# Patient Record
Sex: Male | Born: 1950 | Race: White | Hispanic: No | Smoking: Never smoker
Health system: Southern US, Community
[De-identification: ages and names within clinical notes are randomized; demographics above are authoritative.]

## PROBLEM LIST (undated history)

## (undated) ENCOUNTER — Emergency Department (HOSPITAL_COMMUNITY): Admission: EM | Payer: Medicare Other | Source: Home / Self Care

## (undated) DIAGNOSIS — R911 Solitary pulmonary nodule: Secondary | ICD-10-CM

## (undated) DIAGNOSIS — K5909 Other constipation: Secondary | ICD-10-CM

## (undated) DIAGNOSIS — K760 Fatty (change of) liver, not elsewhere classified: Secondary | ICD-10-CM

## (undated) DIAGNOSIS — K219 Gastro-esophageal reflux disease without esophagitis: Secondary | ICD-10-CM

## (undated) DIAGNOSIS — M1990 Unspecified osteoarthritis, unspecified site: Secondary | ICD-10-CM

## (undated) DIAGNOSIS — F319 Bipolar disorder, unspecified: Secondary | ICD-10-CM

## (undated) DIAGNOSIS — Z8619 Personal history of other infectious and parasitic diseases: Secondary | ICD-10-CM

## (undated) DIAGNOSIS — K449 Diaphragmatic hernia without obstruction or gangrene: Secondary | ICD-10-CM

## (undated) DIAGNOSIS — F329 Major depressive disorder, single episode, unspecified: Secondary | ICD-10-CM

## (undated) DIAGNOSIS — M26609 Unspecified temporomandibular joint disorder, unspecified side: Secondary | ICD-10-CM

## (undated) DIAGNOSIS — G629 Polyneuropathy, unspecified: Secondary | ICD-10-CM

## (undated) DIAGNOSIS — Z87442 Personal history of urinary calculi: Secondary | ICD-10-CM

## (undated) DIAGNOSIS — K589 Irritable bowel syndrome without diarrhea: Secondary | ICD-10-CM

## (undated) DIAGNOSIS — K5730 Diverticulosis of large intestine without perforation or abscess without bleeding: Secondary | ICD-10-CM

## (undated) DIAGNOSIS — Z86711 Personal history of pulmonary embolism: Secondary | ICD-10-CM

## (undated) DIAGNOSIS — R351 Nocturia: Secondary | ICD-10-CM

## (undated) DIAGNOSIS — Z8719 Personal history of other diseases of the digestive system: Secondary | ICD-10-CM

## (undated) DIAGNOSIS — R600 Localized edema: Secondary | ICD-10-CM

## (undated) DIAGNOSIS — I8390 Asymptomatic varicose veins of unspecified lower extremity: Secondary | ICD-10-CM

## (undated) DIAGNOSIS — E785 Hyperlipidemia, unspecified: Secondary | ICD-10-CM

## (undated) DIAGNOSIS — F419 Anxiety disorder, unspecified: Secondary | ICD-10-CM

## (undated) DIAGNOSIS — Z86718 Personal history of other venous thrombosis and embolism: Secondary | ICD-10-CM

## (undated) DIAGNOSIS — F32A Depression, unspecified: Secondary | ICD-10-CM

## (undated) DIAGNOSIS — N201 Calculus of ureter: Secondary | ICD-10-CM

## (undated) DIAGNOSIS — Z8701 Personal history of pneumonia (recurrent): Secondary | ICD-10-CM

## (undated) DIAGNOSIS — Z86010 Personal history of colonic polyps: Secondary | ICD-10-CM

## (undated) HISTORY — PX: SHOULDER ARTHROSCOPY WITH DISTAL CLAVICLE RESECTION: SHX5675

## (undated) HISTORY — DX: Personal history of colonic polyps: Z86.010

## (undated) HISTORY — PX: EXCISIONAL HEMORRHOIDECTOMY: SHX1541

## (undated) HISTORY — PX: COLONOSCOPY WITH ESOPHAGOGASTRODUODENOSCOPY (EGD): SHX5779

## (undated) HISTORY — DX: Irritable bowel syndrome, unspecified: K58.9

## (undated) HISTORY — PX: PLANTAR FASCIA RELEASE: SHX2239

## (undated) HISTORY — PX: PATELLA-FEMORAL ARTHROPLASTY: SHX5037

## (undated) HISTORY — DX: Diaphragmatic hernia without obstruction or gangrene: K44.9

## (undated) HISTORY — DX: Unspecified temporomandibular joint disorder, unspecified side: M26.609

## (undated) HISTORY — PX: SHOULDER ARTHROSCOPY: SHX128

## (undated) HISTORY — PX: UPPER GASTROINTESTINAL ENDOSCOPY: SHX188

## (undated) HISTORY — DX: Fatty (change of) liver, not elsewhere classified: K76.0

## (undated) HISTORY — PX: EXTRACORPOREAL SHOCK WAVE LITHOTRIPSY: SHX1557

## (undated) HISTORY — PX: KNEE ARTHROSCOPY: SHX127

## (undated) HISTORY — DX: Polyneuropathy, unspecified: G62.9

## (undated) HISTORY — DX: Other constipation: K59.09

## (undated) HISTORY — DX: Unspecified osteoarthritis, unspecified site: M19.90

## (undated) HISTORY — PX: COLONOSCOPY: SHX174

## (undated) HISTORY — PX: FOOT NEUROMA SURGERY: SHX646

---

## 1990-04-14 HISTORY — PX: INCISION AND DRAINAGE FOOT: SHX1800

## 1995-04-15 HISTORY — PX: NASAL FRACTURE SURGERY: SHX718

## 1997-07-29 ENCOUNTER — Emergency Department (HOSPITAL_COMMUNITY): Admission: EM | Admit: 1997-07-29 | Discharge: 1997-07-29 | Payer: Self-pay | Admitting: Emergency Medicine

## 1997-09-18 ENCOUNTER — Emergency Department (HOSPITAL_COMMUNITY): Admission: EM | Admit: 1997-09-18 | Discharge: 1997-09-18 | Payer: Self-pay | Admitting: Emergency Medicine

## 1997-09-28 ENCOUNTER — Emergency Department (HOSPITAL_COMMUNITY): Admission: EM | Admit: 1997-09-28 | Discharge: 1997-09-28 | Payer: Self-pay | Admitting: Internal Medicine

## 1998-01-08 ENCOUNTER — Ambulatory Visit (HOSPITAL_COMMUNITY): Admission: RE | Admit: 1998-01-08 | Discharge: 1998-01-08 | Payer: Self-pay | Admitting: Otolaryngology

## 1998-01-08 ENCOUNTER — Encounter: Payer: Self-pay | Admitting: Otolaryngology

## 1998-03-07 ENCOUNTER — Ambulatory Visit (HOSPITAL_COMMUNITY): Admission: RE | Admit: 1998-03-07 | Discharge: 1998-03-07 | Payer: Self-pay | Admitting: Urology

## 1998-03-07 ENCOUNTER — Encounter: Payer: Self-pay | Admitting: Urology

## 1998-04-14 HISTORY — PX: RHINOPLASTY: SUR1284

## 1998-05-15 ENCOUNTER — Encounter: Payer: Self-pay | Admitting: Urology

## 1998-05-15 ENCOUNTER — Ambulatory Visit (HOSPITAL_COMMUNITY): Admission: RE | Admit: 1998-05-15 | Discharge: 1998-05-15 | Payer: Self-pay | Admitting: Urology

## 1998-07-09 ENCOUNTER — Other Ambulatory Visit: Admission: RE | Admit: 1998-07-09 | Discharge: 1998-07-09 | Payer: Self-pay | Admitting: Otolaryngology

## 1998-08-28 ENCOUNTER — Ambulatory Visit (HOSPITAL_COMMUNITY): Admission: RE | Admit: 1998-08-28 | Discharge: 1998-08-28 | Payer: Self-pay | Admitting: Internal Medicine

## 1998-08-28 ENCOUNTER — Encounter: Payer: Self-pay | Admitting: Internal Medicine

## 1998-08-29 ENCOUNTER — Emergency Department (HOSPITAL_COMMUNITY): Admission: EM | Admit: 1998-08-29 | Discharge: 1998-08-29 | Payer: Self-pay | Admitting: Emergency Medicine

## 1998-12-06 ENCOUNTER — Emergency Department (HOSPITAL_COMMUNITY): Admission: EM | Admit: 1998-12-06 | Discharge: 1998-12-06 | Payer: Self-pay | Admitting: Emergency Medicine

## 2001-09-27 ENCOUNTER — Emergency Department (HOSPITAL_COMMUNITY): Admission: EM | Admit: 2001-09-27 | Discharge: 2001-09-27 | Payer: Self-pay | Admitting: Emergency Medicine

## 2001-09-27 ENCOUNTER — Encounter: Payer: Self-pay | Admitting: Emergency Medicine

## 2002-04-14 HISTORY — PX: FOOT NEUROMA SURGERY: SHX646

## 2003-07-06 ENCOUNTER — Inpatient Hospital Stay (HOSPITAL_COMMUNITY): Admission: RE | Admit: 2003-07-06 | Discharge: 2003-07-10 | Payer: Self-pay | Admitting: Orthopedic Surgery

## 2003-09-25 ENCOUNTER — Encounter: Payer: Self-pay | Admitting: Gastroenterology

## 2004-09-27 ENCOUNTER — Ambulatory Visit: Payer: Self-pay | Admitting: Internal Medicine

## 2004-11-11 ENCOUNTER — Ambulatory Visit: Payer: Self-pay | Admitting: Internal Medicine

## 2004-11-21 ENCOUNTER — Ambulatory Visit: Payer: Self-pay | Admitting: Internal Medicine

## 2004-12-31 ENCOUNTER — Ambulatory Visit: Payer: Self-pay | Admitting: Internal Medicine

## 2005-04-01 ENCOUNTER — Ambulatory Visit: Payer: Self-pay | Admitting: Internal Medicine

## 2005-04-08 ENCOUNTER — Ambulatory Visit: Payer: Self-pay | Admitting: Internal Medicine

## 2005-04-10 ENCOUNTER — Emergency Department (HOSPITAL_COMMUNITY): Admission: EM | Admit: 2005-04-10 | Discharge: 2005-04-10 | Payer: Self-pay | Admitting: Emergency Medicine

## 2005-04-29 ENCOUNTER — Ambulatory Visit: Payer: Self-pay | Admitting: Internal Medicine

## 2005-07-31 ENCOUNTER — Ambulatory Visit (HOSPITAL_BASED_OUTPATIENT_CLINIC_OR_DEPARTMENT_OTHER): Admission: RE | Admit: 2005-07-31 | Discharge: 2005-07-31 | Payer: Self-pay | Admitting: Orthopedic Surgery

## 2005-09-15 ENCOUNTER — Ambulatory Visit: Payer: Self-pay | Admitting: Internal Medicine

## 2005-12-09 ENCOUNTER — Ambulatory Visit: Payer: Self-pay | Admitting: Internal Medicine

## 2005-12-16 ENCOUNTER — Ambulatory Visit: Payer: Self-pay | Admitting: Internal Medicine

## 2006-06-04 ENCOUNTER — Ambulatory Visit: Payer: Self-pay | Admitting: Internal Medicine

## 2006-09-09 ENCOUNTER — Ambulatory Visit: Payer: Self-pay | Admitting: Internal Medicine

## 2006-11-23 DIAGNOSIS — F320 Major depressive disorder, single episode, mild: Secondary | ICD-10-CM

## 2006-11-23 DIAGNOSIS — E785 Hyperlipidemia, unspecified: Secondary | ICD-10-CM

## 2006-11-23 DIAGNOSIS — F411 Generalized anxiety disorder: Secondary | ICD-10-CM

## 2006-11-24 ENCOUNTER — Ambulatory Visit: Payer: Self-pay | Admitting: Internal Medicine

## 2006-12-03 ENCOUNTER — Telehealth: Payer: Self-pay | Admitting: Internal Medicine

## 2006-12-16 ENCOUNTER — Ambulatory Visit: Payer: Self-pay | Admitting: Internal Medicine

## 2007-09-03 ENCOUNTER — Ambulatory Visit: Payer: Self-pay | Admitting: Internal Medicine

## 2007-09-07 LAB — CONVERTED CEMR LAB
Alkaline Phosphatase: 53 units/L (ref 39–117)
Bilirubin, Direct: 0.1 mg/dL (ref 0.0–0.3)
Calcium: 9.2 mg/dL (ref 8.4–10.5)
GFR calc Af Amer: 112 mL/min
GFR calc non Af Amer: 93 mL/min
Potassium: 4.6 meq/L (ref 3.5–5.1)
Sodium: 140 meq/L (ref 135–145)
Total Bilirubin: 1 mg/dL (ref 0.3–1.2)

## 2007-10-19 ENCOUNTER — Telehealth (INDEPENDENT_AMBULATORY_CARE_PROVIDER_SITE_OTHER): Payer: Self-pay | Admitting: *Deleted

## 2007-10-20 ENCOUNTER — Telehealth: Payer: Self-pay | Admitting: Gastroenterology

## 2007-10-21 ENCOUNTER — Telehealth: Payer: Self-pay | Admitting: Gastroenterology

## 2007-10-26 ENCOUNTER — Ambulatory Visit: Payer: Self-pay | Admitting: Gastroenterology

## 2007-10-26 DIAGNOSIS — K219 Gastro-esophageal reflux disease without esophagitis: Secondary | ICD-10-CM

## 2007-10-26 LAB — CONVERTED CEMR LAB
Amylase: 84 units/L (ref 27–131)
Hemoglobin, Urine: NEGATIVE
Iron: 103 ug/dL (ref 42–165)
Nitrite: NEGATIVE
Specific Gravity, Urine: <=1.005 (ref 1.000–1.03)
Total Protein, Urine: NEGATIVE mg/dL
Transferrin: 230.3 mg/dL (ref >=212.0)
Vitamin B-12: 360 pg/mL (ref 211–911)
pH: 5.5 (ref 5.0–8.0)

## 2007-10-27 ENCOUNTER — Encounter: Payer: Self-pay | Admitting: Gastroenterology

## 2007-10-27 ENCOUNTER — Ambulatory Visit: Payer: Self-pay | Admitting: Gastroenterology

## 2007-10-27 DIAGNOSIS — Z860100 Personal history of colon polyps, unspecified: Secondary | ICD-10-CM

## 2007-10-27 HISTORY — DX: Personal history of colon polyps, unspecified: Z86.0100

## 2007-10-27 HISTORY — DX: Personal history of colonic polyps: Z86.010

## 2007-10-29 ENCOUNTER — Ambulatory Visit (HOSPITAL_COMMUNITY): Admission: RE | Admit: 2007-10-29 | Discharge: 2007-10-29 | Payer: Self-pay | Admitting: Gastroenterology

## 2007-10-29 ENCOUNTER — Ambulatory Visit: Payer: Self-pay | Admitting: Internal Medicine

## 2007-11-01 ENCOUNTER — Encounter: Payer: Self-pay | Admitting: Gastroenterology

## 2007-11-02 ENCOUNTER — Telehealth: Payer: Self-pay | Admitting: Gastroenterology

## 2008-04-14 HISTORY — PX: CYSTOSCOPY/RETROGRADE/URETEROSCOPY/STONE EXTRACTION WITH BASKET: SHX5317

## 2008-06-02 ENCOUNTER — Ambulatory Visit: Payer: Self-pay | Admitting: Internal Medicine

## 2008-06-02 LAB — CONVERTED CEMR LAB
Bilirubin Urine: NEGATIVE
Glucose, Urine, Semiquant: NEGATIVE
Ketones, urine, test strip: NEGATIVE
Protein, U semiquant: NEGATIVE
pH: 5.5

## 2008-11-10 ENCOUNTER — Ambulatory Visit: Payer: Self-pay | Admitting: Internal Medicine

## 2009-07-09 ENCOUNTER — Encounter: Payer: Self-pay | Admitting: Internal Medicine

## 2009-07-13 ENCOUNTER — Ambulatory Visit: Payer: Self-pay | Admitting: Internal Medicine

## 2009-07-17 LAB — CONVERTED CEMR LAB
BUN: 5 mg/dL — ABNORMAL LOW (ref 6–23)
Bilirubin, Direct: 0 mg/dL (ref 0.0–0.3)
CO2: 29 meq/L (ref 19–32)
Chloride: 105 meq/L (ref 96–112)
Cholesterol: 203 mg/dL — ABNORMAL HIGH (ref 0–200)
Creatinine, Ser: 0.9 mg/dL (ref 0.4–1.5)
Direct LDL: 128.7 mg/dL
Eosinophils Absolute: 0.2 10*3/uL (ref 0.0–0.7)
MCHC: 34.7 g/dL (ref 30.0–36.0)
MCV: 92.1 fL (ref 78.0–100.0)
Monocytes Absolute: 0.4 10*3/uL (ref 0.1–1.0)
Neutrophils Relative %: 52.2 % (ref 43.0–77.0)
PSA: 0.8 ng/mL (ref 0.10–4.00)
Platelets: 164 10*3/uL (ref 150.0–400.0)
Total Bilirubin: 0.5 mg/dL (ref 0.3–1.2)
VLDL: 19.2 mg/dL (ref 0.0–40.0)
Vitamin B-12: 1399 pg/mL — ABNORMAL HIGH (ref 211–911)

## 2009-08-20 ENCOUNTER — Encounter: Payer: Self-pay | Admitting: Internal Medicine

## 2009-11-23 ENCOUNTER — Ambulatory Visit: Payer: Self-pay | Admitting: Internal Medicine

## 2009-11-23 LAB — CONVERTED CEMR LAB
AST: 17 units/L (ref 0–37)
Albumin: 3.7 g/dL (ref 3.5–5.2)
Alkaline Phosphatase: 60 units/L (ref 39–117)
Basophils Absolute: 0 10*3/uL (ref 0.0–0.1)
Bilirubin, Direct: 0.2 mg/dL (ref 0.0–0.3)
Calcium: 8.7 mg/dL (ref 8.4–10.5)
GFR calc non Af Amer: 89.6 mL/min (ref >60)
Glucose, Bld: 69 mg/dL — ABNORMAL LOW (ref 70–99)
Hemoglobin: 14.8 g/dL (ref 13.0–17.0)
Lymphocytes Relative: 26.7 % (ref 12.0–46.0)
Monocytes Relative: 10.1 % (ref 3.0–12.0)
Neutro Abs: 2.4 10*3/uL (ref 1.4–7.7)
Neutrophils Relative %: 57 % (ref 43.0–77.0)
RDW: 13.8 % (ref 11.5–14.6)
Sodium: 145 meq/L (ref 135–145)
Total Bilirubin: 0.7 mg/dL (ref 0.3–1.2)

## 2010-05-14 NOTE — Assessment & Plan Note (Signed)
Summary: fu per pt/pt will come in fasting/njr   Vital Signs:  Patient profile:   60 year old male Weight:      175 pounds BMI:     25.20 Temp:     97.7 degrees F oral Pulse rate:   64 / minute Pulse rhythm:   regular Resp:     12 per minute BP sitting:   98 / 70  (left arm) Cuff size:   regular  Vitals Entered By: Gladis Riffle, RN (July 13, 2009 8:53 AM) CC: discuss problems, fasting Is Patient Diabetic? No Comments started hydroxizine pam last night, today having stomach issues   Primary Care Provider:  Birdie Sons MD  CC:  discuss problems and fasting.  History of Present Illness: Here for Medicare AWV:  1.   Risk factors based on Past M, S, F history:---reviewed with patinet 2.   Physical Activities: pt remains physically active 3.   Depression/mood: chronic problem followed by psychiatry 4.   Hearing: no complaints 5.   ADL's: pt able to do all of ADLs 6.   Fall Risk: none 7.   Home Safety: no issues 8.   Height, weight, &visual acuity: see exam 9.   Counseling: pt counselled to exercise daily and to f/u with psychiatry 10.   Labs ordered based on risk factors: see labs 11.           Referral Coordination-- referrals necessary---see orders 12.           Care Plan : advised daily exercise  In addition pt has several chronic complaints and medical issues: PARESTHESIA (ICD-782.0)---this has been an ongoing problem and has been evaluated by neurologist in [place] GERD (ICD-530.81)---chronic problem and doing well with no meds  HYPERLIPIDEMIA (ICD-272.4)--no meds--needs eval DEPRESSION (ICD-311)--sees dr. Evelene Croon ANXIETY (ICD-300.00)---sees dr. Evelene Croon  All other systems reviewed and were negative except occasionally has burning tongue---has seen dr. Jenne Pane for evaluation    Preventive Screening-Counseling & Management  Alcohol-Tobacco     Smoking Status: never  Current Problems (verified): 1)  Paresthesia  (ICD-782.0) 2)  Gerd  (ICD-530.81) 3)  Renal Calculus   (ICD-592.0) 4)  Diverticulosis, Colon  (ICD-562.10) 5)  Hemorrhoids  (ICD-455.6) 6)  Hyperlipidemia  (ICD-272.4) 7)  Depression  (ICD-311) 8)  Colonic Polyps, Hx of  (ICD-V12.72) 9)  Anxiety  (ICD-300.00)  Current Medications (verified): 1)  Ativan 1 Mg  Tabs (Lorazepam) .... Take 1 Tablet By Mouth Four Times A Day 2)  Hydroxyzine Pamoate 25 Mg Caps (Hydroxyzine Pamoate) .... Take 1-2 Capsules By Mouth At Bedtime  Allergies: 1)  ! * Ivp Dye 2)  Sulfamethoxazole (Sulfamethoxazole)  Past History:  Past Medical History: Last updated: 11/23/2006 KIdney Stones TMJ Anxiety Colonic polyps, hx of Depression Hyperlipidemia  Past Surgical History: Last updated: 11/23/2006 Knee sx Knee cap replacement-07/17/2003 Shoulder sx-07/2005 Colonoscopy-09/2003  Family History: Last updated: 10/26/2007 No FH of Colon Cancer: Family History of Heart Disease: mother father Family History of Liver Disease/Cirrhosis:father  Social History: Last updated: 10/26/2007 Never Smoked Occupation: retired Fish farm manager  Alcohol Use - no Daily Caffeine Use Illicit Drug Use - no Patient gets regular exercise.  Risk Factors: Caffeine Use: 3 (10/26/2007) Exercise: yes (10/26/2007)  Risk Factors: Smoking Status: never (07/13/2009)  Physical Exam  General:  alert and well-developed.   Head:  normocephalic and atraumatic.   Eyes:  pupils equal and pupils round.   Ears:  R ear normal and L ear normal.   Neck:  No deformities, masses, or  tenderness noted. Chest Wall:  No deformities, masses, tenderness or gynecomastia noted. Lungs:  normal respiratory effort and no intercostal retractions.   Heart:  normal rate and regular rhythm.   Abdomen:  Bowel sounds positive,abdomen soft and non-tender without masses, organomegaly or hernias noted. Msk:  No deformity or scoliosis noted of thoracic or lumbar spine.   Neurologic:  cranial nerves II-XII intact and gait normal.   Skin:  turgor normal and color  normal.   Psych:  memory intact for recent and remote and normally interactive.     Impression & Recommendations:  Problem # 1:  Preventive Health Care (ICD-V70.0) ADVISED daily exercise he needs to become active and mentally engaged  Orders: First annual wellness visit with prevention plan  [phone])  Problem # 2:  PARESTHESIA (ICD-782.0)  ongoing complaint he describes paresthesias of upper and lower extremities will refer to neurology  Orders: Neurology Referral (Neuro) TLB-B12 + Folate Pnl ([id number]_82607-B12/FOL)  Problem # 3:  ANXIETY (ICD-300.00) continue regualr f/u dr. Evelene Croon His updated medication list for this problem includes:    Ativan 1 Mg Tabs (Lorazepam) .Marland Kitchen... Take 1 tablet by mouth four times a day    Hydroxyzine Pamoate 25 Mg Caps (Hydroxyzine pamoate) .Marland Kitchen... Take 1-2 capsules by mouth at bedtime  Problem # 4:  HYPERLIPIDEMIA (ICD-272.4)  check labs today  Labs Reviewed: SGOT: 20 (09/03/2007)   SGPT: 20 (09/03/2007)  Complete Medication List: 1)  Ativan 1 Mg Tabs (Lorazepam) .... Take 1 tablet by mouth four times a day 2)  Hydroxyzine Pamoate 25 Mg Caps (Hydroxyzine pamoate) .... Take 1-2 capsules by mouth at bedtime  Other Orders: Venipuncture (40981) TLB-Lipid Panel (80061-LIPID) TLB-BMP (Basic Metabolic Panel-BMET) (80048-METABOL) TLB-CBC Platelet - w/Differential (85025-CBCD) TLB-Hepatic/Liver Function Pnl (80076-HEPATIC) TLB-TSH (Thyroid Stimulating Hormone) (84443-TSH) TLB-PSA (Prostate Specific Antigen) (84153-PSA)  Appended Document: Orders Update    Clinical Lists Changes  Orders: Added new Test order of TLB-Lipid Panel (80061-LIPID) - Signed Added new Test order of TLB-BMP (Basic Metabolic Panel-BMET) (80048-METABOL) - Signed Added new Test order of TLB-CBC Platelet - w/Differential (85025-CBCD) - Signed Added new Test order of TLB-Hepatic/Liver Function Pnl (80076-HEPATIC) - Signed Added new Test order of TLB-TSH (Thyroid Stimulating  Hormone) (84443-TSH) - Signed Added new Test order of TLB-PSA (Prostate Specific Antigen) (84153-PSA) - Signed Added new Test order of TLB-B12, Serum-Total ONLY (19147-W29) - Signed Added new Service order of Venipuncture (56213) - Signed

## 2010-05-14 NOTE — Assessment & Plan Note (Signed)
Summary: not feeling well//ccm   Vital Signs:  Patient profile:   60 year old male Weight:      170 pounds Temp:     97.5 degrees F oral Pulse rate:   66 / minute BP sitting:   120 / 80  (left arm) Cuff size:   regular  Vitals Entered By: Romualdo Bolk, CMA (AAMA) (November 23, 2009 9:10 AM) CC: Rolling sensation in GI track, Anal itching and burning. Pt is also still having pain from where he broke his left arm.   Primary Care Provider:  Birdie Sons MD  CC:  Rolling sensation in GI track and Anal itching and burning. Pt is also still having pain from where he broke his left arm.Marland Kitchen  History of Present Illness: describes growling stomach and anal pruritis for 2 months. sxs can be moderate but they wax and wane. he reports breaking his ulna 2 months ago...took advil and he thinks maybe the advil has caused some of his sxs. (the break did not require casting or surgery)  All other systems reviewed and were negative   Preventive Screening-Counseling & Management  Alcohol-Tobacco     Smoking Status: never  Caffeine-Diet-Exercise     Caffeine use/day: 3     Does Patient Exercise: yes  Current Problems (verified): 1)  Preventive Health Care  (ICD-V70.0) 2)  Gerd  (ICD-530.81) 3)  Renal Calculus  (ICD-592.0) 4)  Diverticulosis, Colon  (ICD-562.10) 5)  Hemorrhoids  (ICD-455.6) 6)  Hyperlipidemia  (ICD-272.4) 7)  Depression  (ICD-311) 8)  Colonic Polyps, Hx of  (ICD-V12.72) 9)  Anxiety  (ICD-300.00)  Current Medications (verified): 1)  Ativan 1 Mg  Tabs (Lorazepam) .... Take 1 Tablet By Mouth Four Times A Day  Allergies (verified): 1)  ! * Ivp Dye 2)  Sulfamethoxazole (Sulfamethoxazole)  Past History:  Past Medical History: Last updated: 11/23/2006 KIdney Stones TMJ Anxiety Colonic polyps, hx of Depression Hyperlipidemia  Past Surgical History: Last updated: 11/23/2006 Knee sx Knee cap replacement-07/17/2003 Shoulder sx-07/2005 Colonoscopy-09/2003  Family  History: Last updated: 10/26/2007 No FH of Colon Cancer: Family History of Heart Disease: mother father Family History of Liver Disease/Cirrhosis:father  Social History: Last updated: 10/26/2007 Never Smoked Occupation: retired Fish farm manager  Alcohol Use - no Daily Caffeine Use Illicit Drug Use - no Patient gets regular exercise.  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 Take 1 tablet by mouth four times a day 2)  Anusol-hc 2.5 % Crea (Hydrocortisone) .... Apply to rectum prn Prescriptions: ANUSOL-HC 2.5 % CREA (HYDROCORTISONE) apply to rectum prn  #30 grams x 1   Entered and Authorized by:   Birdie Sons MD   Signed by:   Birdie Sons MD on 11/23/2009   Method used:   Electronically to        [facility]. [phone]* (retail)       [address]       [place], [place]  [address]  Ph: [phone]       Fax: [phone]   RxID:   [id number]   Appended Document: Orders Update    Clinical Lists Changes  Orders: Added new Service order of Specimen Handling (41324) - Signed

## 2010-05-14 NOTE — Consult Note (Signed)
Summary: [facility]  [facility]   Imported By: Maryln Gottron 09/04/2009 14:59:04  _____________________________________________________________________  External Attachment:    Type:   Image     Comment:   External Document

## 2010-05-14 NOTE — Consult Note (Signed)
Summary: [facility]  [facility]   Imported By: Maryln Gottron 07/17/2009 12:51:57  _____________________________________________________________________  External Attachment:    Type:   Image     Comment:   External Document

## 2010-05-30 ENCOUNTER — Ambulatory Visit (INDEPENDENT_AMBULATORY_CARE_PROVIDER_SITE_OTHER): Payer: Medicare Other | Admitting: Internal Medicine

## 2010-05-30 ENCOUNTER — Encounter: Payer: Self-pay | Admitting: Internal Medicine

## 2010-05-30 VITALS — BP 122/84 | HR 76 | Temp 97.8°F | Ht 72.0 in | Wt 176.0 lb

## 2010-05-30 DIAGNOSIS — L290 Pruritus ani: Secondary | ICD-10-CM

## 2010-05-30 MED ORDER — HYDROCORTISONE 2.5 % RE CREA: TOPICAL_CREAM | RECTAL | Status: DC

## 2010-05-30 NOTE — Progress Notes (Signed)
  Subjective:    Patient ID: CARTEZ MOGLE, male    DOB: 1950/11/18, 60 y.o.   MRN: [id number]  HPI 2 week hx of intractably pruritic rectum. No new soaps, contacts. No unusual foods, no rectal bleeding. Pruritis can be very intense.   Past Medical History  Diagnosis Date  . ABDOMINAL PAIN 11/23/2009  . ANXIETY 11/23/2006  . COLONIC POLYPS, HX OF 11/23/2006  . DEPRESSION 11/23/2006  . DIVERTICULOSIS, COLON 10/25/2007  . GERD 10/26/2007  . HYPERLIPIDEMIA 11/23/2006  . RENAL CALCULUS 10/25/2007   Past Surgical History  Procedure Date  . Knee surgery   . Knee cap replacement 07/17/03  . Shoulder surgery 07/2005    reports that he has never smoked. He has never used smokeless tobacco. He reports that he does not drink alcohol or use illicit drugs. family history includes Cirrhosis in his father and Heart disease in his mother.  There is no history of Colon cancer.      Review of Systems Admits to depression---seeing psychiatry. No other complaints    Objective:   Physical Exam  well-developed well-nourished male in no acute distress. HEENT exam atraumatic, normocephalic, neck supple without jugular venous distention. Chest clear to auscultation cardiac exam S1-S2 are regular. Abdominal exam overweight with bowel sounds, soft and nontender. Extremities no edema. Neurologic exam is alert with a normal gait. Rectum without masses, rash hemorrhoids       Assessment & Plan:  Pruritis ani  unclear etiology. We'll treat with Anusol cream. He'll call me if symptoms persist. I will take a further evaluation necessary at this time.

## 2010-06-04 ENCOUNTER — Other Ambulatory Visit: Payer: Self-pay | Admitting: Diagnostic Neuroimaging

## 2010-06-04 DIAGNOSIS — R200 Anesthesia of skin: Secondary | ICD-10-CM

## 2010-06-06 ENCOUNTER — Other Ambulatory Visit: Payer: Medicare Other

## 2010-06-06 ENCOUNTER — Ambulatory Visit
Admission: RE | Admit: 2010-06-06 | Discharge: 2010-06-06 | Disposition: A | Discharge status: Home / Self Care | Payer: Medicare Other | Source: Ambulatory Visit | Attending: Diagnostic Neuroimaging | Admitting: Diagnostic Neuroimaging

## 2010-06-06 ENCOUNTER — Other Ambulatory Visit: Payer: Self-pay | Admitting: Diagnostic Neuroimaging

## 2010-06-06 DIAGNOSIS — R200 Anesthesia of skin: Secondary | ICD-10-CM

## 2010-08-30 NOTE — Op Note (Signed)
NAME:  David Armstrong, David Armstrong                        ACCOUNT NO.:  [id number]   MEDICAL RECORD NO.:  [id number]                   PATIENT TYPE:  INP   LOCATION:  2899                                 FACILITY:  [facility]   PHYSICIAN:  Deidre Ala, M.D.                 DATE OF BIRTH:  1950/12/08   DATE OF PROCEDURE:  07/06/2003  DATE OF DISCHARGE:                                 OPERATIVE REPORT   PREOPERATIVE DIAGNOSIS:  Right knee patellofemoral arthritis.   POSTOPERATIVE DIAGNOSIS:  Right knee patellofemoral arthritis.   PROCEDURE:  Right knee unicompartmental patellofemoral replacement, cemented  DePuy type.   SURGEON:  Bradley Ferris, M.D.   ASSISTANT:   ANESTHESIA:  General endotracheal anesthesia.   CULTURES:  None.   DRAINS:  Two medium Hemovacs to regular suction.   ESTIMATED BLOOD LOSS:  Less than 100 mL.   REPLACEMENT:  None.   TOURNIQUET TIME:  38 minutes.   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  We saw him first September 20, 2001, and began to work him up for a  degenerative medial meniscus tear.  He had an MRI done and we found that he  had a posterior horn medial meniscus tear with an associated meniscal cyst  and Bakers cyst at the back of the knee.  We elected to proceed with knee  arthroscopy and on November 18, 2001, we found a half dollar size defect  essentially to bone with loose edges of cartilage of the trochlea, medial  and lateral plica was carried out, and we did a release for a tight  retinaculum.  We also did not find a significant lateral meniscus tear.  We  did harvest cells from the notch and went to a cartilage implantation  procedure but, ultimately, that was not approved.  The patient continued to  be followed along for other orthopedic conditions.  He initially did  reasonably well with his knee but continued to have discomfort and  was  ultimately approved for a patellofemoral replacement.  We felt this to be  appropriate since he did not have any other compartment that was  significantly problematic.  At surgery, there was a significant defect of  bone that had not grown over fibrocartilage in the central trochlea.  The  posterior patella had grade 2 changes.  We did a patellofemoral replacement  using a standard right cemented and a 38 mm three peg patella with good  patellar tilt and track and appropriate positioning.  We used Zinacef in the  cement.   PROCEDURE:  With adequate anesthesia obtained using endotracheal technique,  1 gram Ancef was given IV prophylaxis and another one at tourniquet let  down.  The patient was placed in a supine position.  The right lower  extremity was prepped from the toes to the tourniquet in a standard fashion.  After standard prepping and draping, Esmarch exsanguination was used, the  tourniquet was inflated to 350 mmHg.  A medial parapatellar skin incision  was followed by median parapatellar retinacular incision.  The incision was  deepened sharply and hemostasis obtained using the Bovie electrocoagulator.  The patella was everted and the fat pad was excised.  I then assessed the  size of the trochlea, but the standard fit best.  I then marked it with a  marking pen and then created the defect to accept the prosthetic component  with a curved 1/4 inch osteotome and later a bur.  Ultimately, I liked the  fit  with the end of it just in the V apex just above the notch.  We then  drilled for the peg holes.  I then sized the patella to a 25, cut it down  for 10, setting the guide on 17 but it cut to a 15, as is usual, made those  cuts with a little bit of a free handing inferiorly to smooth it flat and  along the oblique angle anterior posterior.  I then placed the three peg  guide in place, made those holes, and then trialed the 38 mm patellar button  replacing 10.  The range was  good with the trial in place on the femur side.  I then thoroughly jet lavaged the knee and mixed cement with Ancef, checked  the components for sizing coming on the field.  I then cemented on the  femoral trochlear component, impacted it, removed excess cement.  I then  cemented on the patellar component, impacted it, and removed excess cement  and held it with a clamp until the cement cured.  I then articulated the  knee through a range of motion which looked good with the appropriate  tracking.  Medial and lateral drains were placed in the gutters, Hemovac  type up and out the superolateral portal.  The wound was then closed in  layers with #1 Vicryl on the retinaculum, 0 and 2-0 Vicryl on the subcu, and  skin staples.  Both sterile compressive dressing was applied after a femoral  nerve block placed and knee immobilizer placed.  The patient, having  tolerated the procedure well, was awakened and taken to the recovery room in  satisfactory condition for routine postoperative care, CPM, and analgesia.                                               Deidre Ala, M.D.    VEP/MEDQ  D:  07/06/2003  T:  07/07/2003  Job:  [id number]   cc:   Sharene Skeans, P.A.

## 2010-08-30 NOTE — Discharge Summary (Signed)
NAME:  David Armstrong, David Armstrong                        ACCOUNT NO.:  [id number]   MEDICAL RECORD NO.:  [id number]                   PATIENT TYPE:  INP   LOCATION:  5038                                 FACILITY:  [facility]   PHYSICIAN:  David Armstrong, M.D.                 DATE OF BIRTH:  May 22, 1950   DATE OF ADMISSION:  07/06/2003  DATE OF DISCHARGE:  07/10/2003                                 DISCHARGE SUMMARY   ADMISSION DIAGNOSIS:  Patellofemoral degenerative joint disease, end stage.   DISCHARGE DIAGNOSES:  Patellofemoral degenerative joint disease, end stage,  status post right knee patellofemoral replacement.   SUMMARY:  The patient was brought to the operating room on 06 July 2003 for  a right knee patellofemoral replacement.  This was done under general  anesthesia  with tourniquet time of 38 minutes.  Dupuy patellofemoral  replacement was used.  The wound was closed without drains, and he was taken  to the recovery room in stable condition on postop day #1, he was doing  well; pain level was appropriate.  No complaints of nausea.  The dressing  was clean and dry.  Calf was soft and nontender.  Labs: Hemoglobin 13.1,  white blood cells 6.1.  INR 1.1.   On postoperatively day #2, March 26,2005, dressing was changed, drain  pulled.  He had no signs of infection.  Lungs were clear, abdomen soft and  nontender with good bowel sounds.  He had no complaints of nausea, taking  p.o. and voiding without difficulties.   On postop day #3, July 09, 2003, he was comfortable, had been out of bed  ambulating in the hall, taking food well, voiding without difficulty.  He  was afebrile.  His vital signs were stable.  The dressing was clean and dry.  Calf was soft and nontender.  He had range of motion to 85 degrees.   On postop day #4, July 10, 2003, he was without complaints and ready to go  home, voiding without difficulty, taking p.o. without problems, no nausea.  Vital signs were stable.  He  was afebrile.  The dressing was clean and dry.  Calf was soft and nontender.  He was neurovascularly intact.  Labs showed a  PT of 15.2 with an INR of 1.3, up from 1.1.   ACTIVITY:  Plan was made to send him home with activity weightbearing as  tolerated with crutches.  Ice and elevate as needed to control swelling.  Home CPM 6 to 8 hours per day, increased by 5 degrees per day to a maximum  of 90 degrees, then call for discharge.   DIET:  Diet was as tolerated.   WOUND CARE:  Wound instructions were to keep clean and dry.  May shower on  Tuesday a.m., do daily dressing changes until followup office visit.   SPECIAL INSTRUCTIONS:  Call for increasing pain, redness, drainage,  bleeding, numbness or  tingling down the right lower extremity, coughing,  shortness of breath, fever greater than 100.5 or chills.   DISCHARGE MEDICATIONS:  1. Effexor 150 mg 1 p.o. q.a.m.  2. Lorazepam 5 mg 1 p.o. q.h.s.  these were home medications.  New medications include:  1. Percocet 325/5 mg 1 to 2 p.o. q.4-6h. p.r.n. for pain, #50 with no     refills.  2. Robaxin 750 mg 1 p.o. q.6-8h. p.o. for spasm or cramp, #40 plus 2     refills.  3. Coumadin 5 mg 2 p.o. daily until he is told to change his dose, #60 plus     1 refill.  4. Lovenox 40 mg subcutaneously daily x 3 days, #3 with no refills.   FOLLOW UP:  1. Home health PT and home heath nurse per David Armstrong.  2. Follow up in the office with Dr. Renae Armstrong on 4 April.  He was instructed to     call for an appointment.  3. Durable medical equipment:  A three in one and a tube seat and lower body     AE.   CONDITION ON DISCHARGE:  Stable.      David Armstrong, P.A.-C.                       David Armstrong, M.D.    AC/MEDQ  D:  07/31/2003  T:  08/01/2003  Job:  [id number]   cc:   David Armstrong [phone]

## 2010-08-30 NOTE — Op Note (Signed)
NAME:  David Armstrong, David Armstrong NO.:  [id number]   MEDICAL RECORD NO.:  [id number]          PATIENT TYPE:  AMB   LOCATION:  DSC                          FACILITY:  [facility]   PHYSICIAN:  Loreta Ave, M.D. DATE OF BIRTH:  1950-09-29   DATE OF PROCEDURE:  07/31/2005  DATE OF DISCHARGE:                                 OPERATIVE REPORT   PREOPERATIVE DIAGNOSIS:  Right shoulder impingement, distal clavicle.  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  Also posttraumatic degenerative changes first tarsometatarsal  joint, left foot.   POSTOPERATIVE DIAGNOSIS:  Right shoulder impingement, distal clavicle.  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  Also posttraumatic degenerative changes first tarsometatarsal  joint, left foot.   OPERATION PERFORMED:  1.  Right shoulder examination under anesthesia, arthroscopy with      manipulation, lysis break up of adhesions achieving full motion.      Arthroscopic debridement of anterior labral tear with debridement of      intra- and extra-articular adhesions.  Bursectomy, acromioplasty.  CA      ligament release, excision distal clavicle.  2.  Injection of left foot first tarsometatarsal joint under fluoroscopic      guidance with Depo-Medrol and Marcaine.   SURGEON:  Loreta Ave, M.D.   ASSISTANT:  Genene Churn. Denton Meek.   ANESTHESIA:  General.   SPECIMENS:  None.   CULTURES:  None.   COMPLICATIONS:  None.   DRESSING:  Soft compressive with shoulder sling on the right.  Band-aid left  foot.   DESCRIPTION OF PROCEDURE:  The patient was brought to the operating room and  after adequate anesthesia had been obtained, the right shoulder was  examined.  30 to 40%  restriction of motion, all planes.  Gently manipulated  to break up obvious intra- and extra-articular adhesions.  Achieved full  motion maintaining stable shoulder without undue  occurrence.  Placed in  beach chair position, prepped and draped in the usual sterile fashion.  Anterior,  posterior and lateral portals.  Shoulder entered with blunt  obturator.  Arthroscope introduced.  Shoulder distended and inspected.  Complex tearing, anterior labrum debrided.  Tearing of the anterior capsule,  inferior capsule from manipulation documented.  Not producing an instability  pattern.  Some small areas of erosive change on the front of the glenoid  juxtaposed to the labral tear debrided.  Remaining articular cartilage  actually looked quite good.  Some complex tearing superior and posterior  labrum debrided.  Biceps tendon, biceps anchor.  Undersurface rotator cuff  intact.  Cannula redirected subacromially.  Type 2 to 3 acromion, obvious  posttraumatic impingement.  Grade 4 changes acromioclavicular joint with  periarticular spurs. Bursa resected, cuff debrided.  CA ligament released  with cautery.  Acromioplasty to a type 1 acromion.  Lateral centimeter of  clavicle resected with shaver and high speed bur.  Adequacy of cuff  debridement, clavicle excision and acromioplasty confirmed viewing from all  portals.  Instruments and fluid removed.  Portals of closed with nylon.  Sterile compressive dressing applied.  Shoulder  immobilizer applied.  Attention turned to left foot.  Under fluoroscopic guidance and with sterile  technique, I approached the first tarsometatarsal joint dorsomedial aspect  of the foot with a 22 gauge needle.  The needle directed into the joint  confirmed fluoroscopically.  The joint was then injected with 1 mL of  Marcaine without epinephrine and 1 mL of Depo-Medrol 40.  Band-aid applied.  Following this, anesthesia reversed.  Brought to recovery room.  Tolerated  surgery well.  No complications.      Loreta Ave, M.D.  Electronically Signed     DFM/MEDQ  D:  07/31/2005  T:  08/01/2005  Job:  [id number]

## 2010-10-21 ENCOUNTER — Other Ambulatory Visit: Payer: Self-pay | Admitting: Internal Medicine

## 2010-10-30 ENCOUNTER — Ambulatory Visit (INDEPENDENT_AMBULATORY_CARE_PROVIDER_SITE_OTHER): Payer: Medicare Other | Admitting: Family Medicine

## 2010-10-30 VITALS — BP 102/62 | Temp 97.8°F | Wt 179.0 lb

## 2010-10-30 DIAGNOSIS — K219 Gastro-esophageal reflux disease without esophagitis: Secondary | ICD-10-CM

## 2010-10-30 DIAGNOSIS — N419 Inflammatory disease of prostate, unspecified: Secondary | ICD-10-CM

## 2010-10-30 DIAGNOSIS — F411 Generalized anxiety disorder: Secondary | ICD-10-CM

## 2010-10-30 DIAGNOSIS — F419 Anxiety disorder, unspecified: Secondary | ICD-10-CM

## 2010-10-30 MED ORDER — OMEPRAZOLE 40 MG PO CPDR: DELAYED_RELEASE_CAPSULE | ORAL | Status: DC

## 2010-10-30 MED ORDER — CIPROFLOXACIN HCL 500 MG PO TABS: 500.0000 mg | ORAL_TABLET | Freq: Two times a day (BID) | ORAL | Status: AC

## 2010-10-30 MED ORDER — HYDROCORTISONE 2.5 % RE CREA: TOPICAL_CREAM | Freq: Two times a day (BID) | RECTAL | Status: DC

## 2010-10-30 NOTE — Patient Instructions (Addendum)
The tickling and clearing of throat secondary to [facility] , esophageal reflux, you stated you have hiatal hernia Low back pain and rectal pain secondary to prostatitis, to tx with cipro 500 mg twice daily Will refill your proctosol , hemorrhoid is not present now Continue Ativan for anxiety

## 2010-11-05 ENCOUNTER — Other Ambulatory Visit: Payer: Self-pay

## 2010-11-05 MED ORDER — OMEPRAZOLE 40 MG PO CPDR: 40.0000 mg | DELAYED_RELEASE_CAPSULE | Freq: Every day | ORAL | Status: DC

## 2010-11-05 NOTE — Telephone Encounter (Signed)
rx faxed to pharmacy twice with correct directions.

## 2010-11-21 ENCOUNTER — Encounter: Payer: Self-pay | Admitting: *Deleted

## 2010-11-22 ENCOUNTER — Ambulatory Visit (INDEPENDENT_AMBULATORY_CARE_PROVIDER_SITE_OTHER): Payer: Medicare Other | Admitting: Gastroenterology

## 2010-11-22 ENCOUNTER — Other Ambulatory Visit (INDEPENDENT_AMBULATORY_CARE_PROVIDER_SITE_OTHER): Payer: Medicare Other

## 2010-11-22 ENCOUNTER — Encounter: Payer: Self-pay | Admitting: Gastroenterology

## 2010-11-22 DIAGNOSIS — K6289 Other specified diseases of anus and rectum: Secondary | ICD-10-CM

## 2010-11-22 DIAGNOSIS — D509 Iron deficiency anemia, unspecified: Secondary | ICD-10-CM

## 2010-11-22 DIAGNOSIS — K219 Gastro-esophageal reflux disease without esophagitis: Secondary | ICD-10-CM

## 2010-11-22 DIAGNOSIS — R6889 Other general symptoms and signs: Secondary | ICD-10-CM

## 2010-11-22 LAB — IBC PANEL
Iron: 127 ug/dL (ref 42–165)
Saturation Ratios: 35.7 % (ref 20.0–50.0)
Transferrin: 254 mg/dL (ref 212.0–360.0)

## 2010-11-22 LAB — VITAMIN B12: Vitamin B-12: 413 pg/mL (ref 211–911)

## 2010-11-22 LAB — FOLATE: Folate: 16 ng/mL (ref >5.9)

## 2010-11-22 NOTE — Progress Notes (Signed)
History of Present Illness:  This is a 60 year old Caucasian male former patient of mine who suffers from chronic IBS and diverticulosis and periodic GERD. He now presents with" not feeling well" without any other specific symptomatology except for periodic itching around his rectum. He said multiple GI evaluations in the past which have been unremarkable except for diverticulosis. He currently denies dysphagia, significant acid reflux, abdominal pain, rectal bleeding, melena, anorexia or weight loss. He does have nonspecific myalgias but no arthritis. He is followed by Dr. Bill Salinas psychiatry for chronic anxiety, is apparently totally disabled from this condition currently using frequent Ativan, but he apparently is off Effexor. Review of recent labs show no specific abnormalities.  I have reviewed this patient's present history, medical and surgical past history, allergies and medications.     ROS: The remainder of the 10 point ROS is negative... previous history of kidney stones but no active urologic symptoms. He also denies any cardiovascular or pulmonary complaints. Complaints today include low back pain, depression, insomnia, but no specific ischemic complaints otherwise. Appetite is good and his weight is stable and he denies any specific food intolerances.  Past Medical History  Diagnosis Date  . ABDOMINAL PAIN   . ANXIETY   . Personal history of colonic polyps 10/27/2007    HYPERPLASTIC POLYP  . DEPRESSION   . DIVERTICULOSIS, COLON   . GERD   . HYPERLIPIDEMIA   . RENAL CALCULUS   . IBS (irritable bowel syndrome)   . Hiatal hernia   . H. pylori infection   . Arthritis   . Kidney stones   . Anal fissure    Past Surgical History  Procedure Date  . Knee surgery     both knees  . Shoulder surgery 07/2005  . Foot surgery     x 4  . Hammer toe surgery   . Lithotripsy     reports that he has never smoked. He has never used smokeless tobacco. He reports that he does not drink alcohol  or use illicit drugs. family history includes Cirrhosis in his father and Heart disease in his mother.  There is no history of Colon cancer. Allergies  Allergen Reactions  . Soma (Carisoprodol)   . Sulfamethoxazole     REACTION: unspecified        Physical Exam: General well developed well nourished patient in no acute distress, appearing his stated age Eyes PERRLA, no icterus, fundoscopic exam per opthamologist Skin no lesions noted Neck supple, no adenopathy, no thyroid enlargement, no tenderness Chest clear to percussion and auscultation Heart no significant murmurs, gallops or rubs noted Abdomen no hepatosplenomegaly masses or tenderness, BS normal.  Rectal inspection normal no fissures, or fistulae noted.  No masses or tenderness on digital exam. Stool guaiac negative. Extremities no acute joint lesions, edema, phlebitis or evidence of cellulitis. Neurologic patient oriented x 3, cranial nerves intact, no focal neurologic deficits noted. Psychological mental status normal and normal affect.  Assessment and plan: I do not see any rectal fissure or hemorrhoids on exam today, his prostate does not appear to be enlarged or tender. 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 I really see no need for repeat GI evaluation this time, but I have ordered sedimentation rate, CRP, celiac serology, Balneol rectal cleansing with zinc oxide paste locally. I've advised him to stay away from cortisone preparation around his rectum, to take daily Prilosec for his acid reflux, and to see me again in one month's time for  followup. He may need repeat psychiatric evaluation, and resumption of his antidepressant medications.  Please copy Dr. Birdie Sons and his psychiatrist Dr, Crissie Reese  Encounter Diagnoses  Name Primary?  . Esophageal reflux   . Rectal pain   . Iron deficiency anemia, unspecified    . Other general symptoms

## 2010-11-22 NOTE — Patient Instructions (Signed)
Buy OTC Balenol and Xinc Oxide paste and use several times a day. Please go to the basement today for your labs.  Make an appt to come back in one month and see Dr Jarold Motto

## 2010-11-25 ENCOUNTER — Telehealth: Payer: Self-pay | Admitting: *Deleted

## 2010-11-25 LAB — CELIAC PANEL 10
Gliadin IgA: 9.2 U/mL (ref <20)
Gliadin IgG: 8.7 U/mL (ref <20)
Tissue Transglut Ab: 34.1 U/mL — ABNORMAL HIGH (ref <20)

## 2010-11-25 NOTE — Telephone Encounter (Signed)
Message copied by Leonette Monarch on Mon Nov 25, 2010  9:41 AM ------      Message from: Jarold Motto, DAVID R      Created: Mon Nov 25, 2010  9:12 AM       All labs normal...please call him

## 2010-11-25 NOTE — Telephone Encounter (Signed)
Message copied by Leonette Monarch on Mon Nov 25, 2010  4:47 PM ------      Message from: Jarold Motto, DAVID R      Created: Mon Nov 25, 2010  4:40 PM       NEEDS ENDO AND SI BX. AND COLON F/U [facility].Marland KitchenMarland Kitchen

## 2010-11-25 NOTE — Telephone Encounter (Signed)
Pt aware.

## 2010-11-25 NOTE — Progress Notes (Signed)
  Subjective:    Patient ID: David Armstrong, male    DOB: 1950/09/01, 60 y.o.   MRN: [id number] This 60 year old white married male patient of Dr. Cato Mulligan is in complaining of tickling in his throat which comes and goes also needs to clear his throat. He has known hiatal hernia and being treated for GERD in the care of Dr. Sheryn Bison he complains of low back pain as well as anal pain for which he has had a hemorrhoid in the past he has pain over the lower lumbar spine as well as suprapubic pain he relates no urinary tract symptoms has had no history compatible with low back strain no history of chest pain shortness of breath nor cough HPI    Review of Systems See history of present illness    Objective:   Physical Exam The patient is a well-developed well-nourished slightly obese white male who appears to be in no distress pleasant cooperative and very talkative HEENT reveals no abnormalities no postnasal drainage pharynx is negative Heart and lungs no abnormalities noted Abdomen liver spleen kidneys are nonpalpable no epigastric tenderness bowel sounds normal no mass Rectal examination reveals enlarged prostate marked tenderness no nodules no hemorrhoids present Examination of the spine reveals no tenderness no muscle spasm        Assessment & Plan:  Gastroesophageal reflux which I think is causing the Tequin and cleared of his throat and will treat with omeprazole 40 mg daily Prostatitis to treat with Cipro 500 mg twice a day Anxiety and stress to continue Ativan

## 2010-11-27 ENCOUNTER — Encounter: Payer: Self-pay | Admitting: Gastroenterology

## 2010-11-27 NOTE — Telephone Encounter (Signed)
Advised pt that he ECL in [facility] for rectal pain and ? Sprue, patient agrees to have ECL and cx ov on 12/24/2010. [place] will schedule visits.

## 2010-12-09 ENCOUNTER — Telehealth: Payer: Self-pay | Admitting: Internal Medicine

## 2010-12-09 NOTE — Telephone Encounter (Signed)
Pt said that his great toe and ankle pulled back and stayed locked up for approx 5 mins. Pt then had a cramp in calve. Pt wants to know whats causing this. Very painful.

## 2010-12-09 NOTE — Telephone Encounter (Signed)
Pt is seeing Podiatrist this afternoon and will let us know if he needs anything further from [place].

## 2010-12-12 ENCOUNTER — Telehealth: Payer: Self-pay | Admitting: *Deleted

## 2010-12-12 NOTE — Telephone Encounter (Signed)
Called pt to reschedule PV, no answer. [name]

## 2010-12-13 ENCOUNTER — Ambulatory Visit (AMBULATORY_SURGERY_CENTER): Payer: Medicare Other | Admitting: *Deleted

## 2010-12-13 VITALS — Ht 70.0 in | Wt 174.2 lb

## 2010-12-13 DIAGNOSIS — K6289 Other specified diseases of anus and rectum: Secondary | ICD-10-CM

## 2010-12-13 DIAGNOSIS — K219 Gastro-esophageal reflux disease without esophagitis: Secondary | ICD-10-CM

## 2010-12-13 DIAGNOSIS — D509 Iron deficiency anemia, unspecified: Secondary | ICD-10-CM

## 2010-12-13 MED ORDER — PEG-KCL-NACL-NASULF-NA ASC-C 100 G PO SOLR: ORAL | Status: DC

## 2010-12-24 ENCOUNTER — Ambulatory Visit: Payer: Medicare Other | Admitting: Gastroenterology

## 2010-12-25 ENCOUNTER — Other Ambulatory Visit: Payer: Medicare Other | Admitting: Gastroenterology

## 2010-12-30 ENCOUNTER — Encounter: Payer: Self-pay | Admitting: Gastroenterology

## 2010-12-30 ENCOUNTER — Ambulatory Visit (AMBULATORY_SURGERY_CENTER): Payer: Medicare Other | Admitting: Gastroenterology

## 2010-12-30 ENCOUNTER — Other Ambulatory Visit: Payer: Self-pay | Admitting: Gastroenterology

## 2010-12-30 VITALS — BP 110/73 | HR 71 | Temp 97.3°F | Resp 27 | Ht 70.0 in | Wt 174.0 lb

## 2010-12-30 DIAGNOSIS — K219 Gastro-esophageal reflux disease without esophagitis: Secondary | ICD-10-CM

## 2010-12-30 DIAGNOSIS — R197 Diarrhea, unspecified: Secondary | ICD-10-CM

## 2010-12-30 DIAGNOSIS — K5730 Diverticulosis of large intestine without perforation or abscess without bleeding: Secondary | ICD-10-CM

## 2010-12-30 DIAGNOSIS — K589 Irritable bowel syndrome without diarrhea: Secondary | ICD-10-CM

## 2010-12-30 DIAGNOSIS — Z1211 Encounter for screening for malignant neoplasm of colon: Secondary | ICD-10-CM

## 2010-12-30 DIAGNOSIS — R109 Unspecified abdominal pain: Secondary | ICD-10-CM

## 2010-12-30 DIAGNOSIS — G8929 Other chronic pain: Secondary | ICD-10-CM

## 2010-12-30 DIAGNOSIS — K624 Stenosis of anus and rectum: Secondary | ICD-10-CM

## 2010-12-30 DIAGNOSIS — D1330 Benign neoplasm of unspecified part of small intestine: Secondary | ICD-10-CM

## 2010-12-30 MED ORDER — LIDOCAINE 5 % EX OINT: TOPICAL_OINTMENT | Freq: Two times a day (BID) | CUTANEOUS | Status: DC

## 2010-12-30 MED ORDER — SODIUM CHLORIDE 0.9 % IV SOLN: 500.0000 mL | INTRAVENOUS | Status: DC

## 2010-12-30 NOTE — Patient Instructions (Signed)
FOLLOW DISCHARGE INSTRUCTIONS (BLUE & GREEN )  INFORMATION GIVEN TO YOU ON DIVERTICULOSIS & HIATAL HERNIA & REFLUX  AWAIT BIOPSY RESULTS.

## 2010-12-30 NOTE — Progress Notes (Signed)
Hung second bag of ns0.9% .  Maw  Pt tolerated the colon exam very well.  maw

## 2010-12-31 ENCOUNTER — Telehealth: Payer: Self-pay | Admitting: *Deleted

## 2010-12-31 NOTE — Telephone Encounter (Signed)
Not a working number

## 2011-01-01 DIAGNOSIS — K219 Gastro-esophageal reflux disease without esophagitis: Secondary | ICD-10-CM

## 2011-01-02 ENCOUNTER — Encounter: Payer: Self-pay | Admitting: Gastroenterology

## 2011-01-02 ENCOUNTER — Telehealth: Payer: Self-pay | Admitting: *Deleted

## 2011-01-02 NOTE — Telephone Encounter (Signed)
Message copied by [name] on Thu Jan 02, 2011  9:08 AM ------      Message from: Jarold Motto, DAVID R      Created: Thu Jan 02, 2011  8:46 AM       10 days of Prevpac, then resume PPI.

## 2011-01-03 ENCOUNTER — Encounter: Payer: Self-pay | Admitting: *Deleted

## 2011-01-03 ENCOUNTER — Other Ambulatory Visit: Payer: Self-pay | Admitting: Gastroenterology

## 2011-01-03 MED ORDER — AMOXICILL-CLARITHRO-LANSOPRAZ PO MISC: Freq: Two times a day (BID) | ORAL | Status: DC

## 2011-01-03 NOTE — Telephone Encounter (Signed)
I have tried for 2 days and pt's phone number listed is not connected. Will write him a letter with results and the need for him to call back. I called his pharmacy listed and the number they gave me has been reissued the person stated.

## 2011-01-07 ENCOUNTER — Encounter: Payer: Self-pay | Admitting: Gastroenterology

## 2011-01-10 ENCOUNTER — Encounter: Payer: Self-pay | Admitting: *Deleted

## 2011-01-10 ENCOUNTER — Telehealth: Payer: Self-pay | Admitting: *Deleted

## 2011-01-10 NOTE — Telephone Encounter (Addendum)
I have not heard from pt since the letter was mailed. I spoke with a Pharmacist at [facility] at [facility] which the [employer] for pt and the Prev Pak was not picked up. I called Dr Carie Caddy ofc and they have the same number as [place]. Wrote a second letter and mailed to pt.

## 2011-01-10 NOTE — Telephone Encounter (Signed)
Message copied by [name] on Fri Jan 10, 2011  9:21 AM ------      Message from: [name]      Created: Fri Jan 03, 2011 10:55 AM      Regarding: David Armstrong       Sent letter, check on pt's call

## 2011-01-13 ENCOUNTER — Telehealth: Payer: Self-pay | Admitting: Gastroenterology

## 2011-01-13 ENCOUNTER — Other Ambulatory Visit: Payer: Self-pay | Admitting: Gastroenterology

## 2011-01-13 MED ORDER — AMOXICILLIN 500 MG PO CAPS: ORAL_CAPSULE | ORAL | Status: DC

## 2011-01-13 MED ORDER — RABEPRAZOLE SODIUM 20 MG PO TBEC: DELAYED_RELEASE_TABLET | ORAL | Status: DC

## 2011-01-13 MED ORDER — CLARITHROMYCIN 500 MG PO TABS: 500.0000 mg | ORAL_TABLET | Freq: Two times a day (BID) | ORAL | Status: AC

## 2011-01-13 NOTE — Telephone Encounter (Signed)
Pt called after he received his letter. He stated the Pharmacy stated the Prev Pak will be expensive and they will have to order it.  Gave pt samples of Aciphex and ordered Amoxicillin and Biaxin.

## 2011-01-14 ENCOUNTER — Telehealth: Payer: Self-pay | Admitting: *Deleted

## 2011-01-14 NOTE — Telephone Encounter (Signed)
Note sent to [facility].

## 2011-01-14 NOTE — Telephone Encounter (Signed)
Forwarding to you Aram Beecham to call patient.  Thanks.

## 2011-01-14 NOTE — Telephone Encounter (Signed)
Instructed pt again to take the Aciphex, Amoxicillin and Clarithromycin 12 hours apart for 7 days. Pt stated understanding.

## 2011-01-15 ENCOUNTER — Telehealth: Payer: Self-pay | Admitting: Gastroenterology

## 2011-01-16 NOTE — Telephone Encounter (Signed)
Pt reports the meds for H.Pylori leave a bad taste in his mouth. He denies n/v, swelling, rash, etc. Suggested pt try crackers when taking the med. Pt stated he's on day 3 of 7 and will call for problems.

## 2011-01-17 ENCOUNTER — Telehealth: Payer: Self-pay | Admitting: Gastroenterology

## 2011-01-17 NOTE — Telephone Encounter (Signed)
Pt reports he's feeling a little better since he called earlier. He reports his lips are blistered, "like Herpes Simplex", and he's not sure if it's the antibiotics. Asked if he should put Blistex in his lips. He thinks one side of his neck is swollen, but he's not sure if it's new. He denies trouble breathing or swallowing and his tongue is not swollen. He reports he rode his bike for 6-7 miles today. Explained to pt some drugs cause photosensitivity and maybe that's what's wrong with his lips. He stated it may all be his anxiety. Instructed pt to call 911 for trouble breathing or swallowing or other signs of distress; stay out of the sun. If he needs to speak to a md, one is on call all night and the weekend. Pt stated understanding.

## 2011-04-15 HISTORY — PX: HAMMER TOE SURGERY: SHX385

## 2011-04-18 ENCOUNTER — Encounter: Payer: Self-pay | Admitting: Gastroenterology

## 2011-05-08 DIAGNOSIS — G609 Hereditary and idiopathic neuropathy, unspecified: Secondary | ICD-10-CM | POA: Diagnosis not present

## 2011-05-08 DIAGNOSIS — G5610 Other lesions of median nerve, unspecified upper limb: Secondary | ICD-10-CM | POA: Diagnosis not present

## 2011-05-08 DIAGNOSIS — IMO0002 Reserved for concepts with insufficient information to code with codable children: Secondary | ICD-10-CM | POA: Diagnosis not present

## 2011-05-08 DIAGNOSIS — M5412 Radiculopathy, cervical region: Secondary | ICD-10-CM | POA: Diagnosis not present

## 2011-05-19 ENCOUNTER — Encounter: Payer: Self-pay | Admitting: Gastroenterology

## 2011-05-19 ENCOUNTER — Ambulatory Visit (INDEPENDENT_AMBULATORY_CARE_PROVIDER_SITE_OTHER): Payer: Medicare Other | Admitting: Gastroenterology

## 2011-05-19 ENCOUNTER — Telehealth: Payer: Self-pay | Admitting: *Deleted

## 2011-05-19 DIAGNOSIS — F064 Anxiety disorder due to known physiological condition: Secondary | ICD-10-CM | POA: Diagnosis not present

## 2011-05-19 DIAGNOSIS — R109 Unspecified abdominal pain: Secondary | ICD-10-CM | POA: Diagnosis not present

## 2011-05-19 MED ORDER — ESOMEPRAZOLE MAGNESIUM 40 MG PO CPDR: 40.0000 mg | DELAYED_RELEASE_CAPSULE | Freq: Every day | ORAL | Status: DC | PRN

## 2011-05-19 NOTE — Patient Instructions (Addendum)
Your abdominal ultrasound is scheduled at [facility] tomorrow 05/20/2011 arrive 8:45am make sure you have nothing to eat or drink after midnight.  Do not start Nexium until after you take the Breath Test then take as needed.  You have been scheduled for a H Pylori breath test for 05/27/2011 arrive 8:15am, please follow the separate instructions given to you today by Aram Beecham.

## 2011-05-19 NOTE — Telephone Encounter (Signed)
Pt given instructions and appt for Urea Breath Test for 05/27/11 at 0815am; pt stated understanding.

## 2011-05-19 NOTE — Progress Notes (Signed)
This is a 61 year old Caucasian male chronically disabled because of chronic anxiety syndrome. He has chronic IBS with a variety of GI issues, and was treated for H. pylori gastritis within the last year without improvement in his chronic dyspepsia. He now complains of vague discomfort in his right upper quadrant and right subscapular area without specific hepatobiliary or other GI complaints. He does eat well and denies food intolerances, diarrhea, melena or hematochezia. No history of dysphagia, melena or hematochezia. Is not on PPI medication at this time. Last CT scan of the abdomen was 10 years ago. Family history is noncontributory. He is up-to-date on his endoscopy and colonoscopy exams.  Current Medications, Allergies, Past Medical History, Past Surgical History, Family History and Social History were reviewed in [employer] record.  Pertinent Review of Systems Negative   Physical Exam: Cannot appreciate stigmata of chronic liver disease. His chest is entirely clear to percussion and auscultation. Is in a regular rhythm without murmurs gallops or rubs. There is no hepatosplenomegaly, abdominal masses or tenderness. Bowel sounds were normal. Peripheral extremities unremarkable. Mental status is clear.    Assessment and Plan: Musculoskeletal right upper quadrant pain, rule out cholelithiasis. I have scheduled upper abdominal ultrasound exam at his convenience. We will check urea hydrogen breath testing for H. pylori eradication. I have given him some AcipHex 20 mg to use on a when necessary basis for his occasional acid reflux symptomatology. Encounter Diagnosis  Name Primary?  . Abdominal pain Yes

## 2011-05-20 ENCOUNTER — Telehealth: Payer: Self-pay | Admitting: *Deleted

## 2011-05-20 ENCOUNTER — Ambulatory Visit (HOSPITAL_COMMUNITY)
Admission: RE | Admit: 2011-05-20 | Discharge: 2011-05-20 | Disposition: A | Discharge status: Home / Self Care | Payer: Medicare Other | Source: Ambulatory Visit | Attending: Gastroenterology | Admitting: Gastroenterology

## 2011-05-20 ENCOUNTER — Other Ambulatory Visit: Payer: Self-pay | Admitting: Gastroenterology

## 2011-05-20 DIAGNOSIS — Z8619 Personal history of other infectious and parasitic diseases: Secondary | ICD-10-CM

## 2011-05-20 DIAGNOSIS — R109 Unspecified abdominal pain: Secondary | ICD-10-CM | POA: Insufficient documentation

## 2011-05-20 DIAGNOSIS — L299 Pruritus, unspecified: Secondary | ICD-10-CM | POA: Diagnosis not present

## 2011-05-20 NOTE — Telephone Encounter (Signed)
Pt aware.

## 2011-05-20 NOTE — Telephone Encounter (Signed)
Message copied by Leonette Monarch on Tue May 20, 2011 12:35 PM ------      Message from: Jarold Motto, DAVID R      Created: Tue May 20, 2011 10:17 AM       NORMAL EXAM

## 2011-05-27 ENCOUNTER — Ambulatory Visit (INDEPENDENT_AMBULATORY_CARE_PROVIDER_SITE_OTHER): Payer: Medicare Other | Admitting: Gastroenterology

## 2011-05-27 DIAGNOSIS — R1013 Epigastric pain: Secondary | ICD-10-CM | POA: Diagnosis not present

## 2011-05-27 DIAGNOSIS — K3189 Other diseases of stomach and duodenum: Secondary | ICD-10-CM | POA: Diagnosis not present

## 2011-05-27 NOTE — Progress Notes (Signed)
Patient arrived for H. Pylori breath test. He states he has not eaten or smoked this AM. He has not taken PPI, antimicrobilas or bismuth for 2 weeks or antibiotics for 4 weeks. Patient obtained a baseline sample then drank the Pranactin-cirtic mix. After 15 minutes, the second breath sample was obtained and patient was discharge. Tolerated procedure well.

## 2011-06-03 ENCOUNTER — Other Ambulatory Visit: Payer: Self-pay | Admitting: *Deleted

## 2011-06-03 ENCOUNTER — Telehealth: Payer: Self-pay | Admitting: *Deleted

## 2011-06-03 MED ORDER — BIS SUBCIT-METRONID-TETRACYC 140-125-125 MG PO CAPS: 3.0000 | ORAL_CAPSULE | Freq: Three times a day (TID) | ORAL | Status: AC

## 2011-06-03 NOTE — Telephone Encounter (Signed)
Pt aware, he will try the pylera

## 2011-06-03 NOTE — Telephone Encounter (Signed)
Per verbal order from Dr Jarold Motto patient should start on Pylera due tp positive H pylori breath test. I have sent rx to pharmacy.

## 2011-06-19 DIAGNOSIS — M5412 Radiculopathy, cervical region: Secondary | ICD-10-CM | POA: Diagnosis not present

## 2011-06-19 DIAGNOSIS — G609 Hereditary and idiopathic neuropathy, unspecified: Secondary | ICD-10-CM | POA: Diagnosis not present

## 2011-06-19 DIAGNOSIS — G5600 Carpal tunnel syndrome, unspecified upper limb: Secondary | ICD-10-CM | POA: Diagnosis not present

## 2011-06-19 DIAGNOSIS — IMO0002 Reserved for concepts with insufficient information to code with codable children: Secondary | ICD-10-CM | POA: Diagnosis not present

## 2011-06-20 ENCOUNTER — Telehealth: Payer: Self-pay | Admitting: Gastroenterology

## 2011-06-20 NOTE — Telephone Encounter (Signed)
Patient went to see a neurologist and was given medication for shingles. He states he does not have a rash and wants to know if there is a test for shingles. Suggested to patient that he call his PCP for this.

## 2011-06-25 DIAGNOSIS — B029 Zoster without complications: Secondary | ICD-10-CM | POA: Diagnosis not present

## 2011-06-25 DIAGNOSIS — L988 Other specified disorders of the skin and subcutaneous tissue: Secondary | ICD-10-CM | POA: Diagnosis not present

## 2011-06-25 DIAGNOSIS — L218 Other seborrheic dermatitis: Secondary | ICD-10-CM | POA: Diagnosis not present

## 2011-07-01 DIAGNOSIS — D2330 Other benign neoplasm of skin of unspecified part of face: Secondary | ICD-10-CM | POA: Diagnosis not present

## 2011-07-01 DIAGNOSIS — L570 Actinic keratosis: Secondary | ICD-10-CM | POA: Diagnosis not present

## 2011-07-02 DIAGNOSIS — M7150 Other bursitis, not elsewhere classified, unspecified site: Secondary | ICD-10-CM | POA: Diagnosis not present

## 2011-07-02 DIAGNOSIS — M65979 Unspecified synovitis and tenosynovitis, unspecified ankle and foot: Secondary | ICD-10-CM | POA: Diagnosis not present

## 2011-07-02 DIAGNOSIS — M722 Plantar fascial fibromatosis: Secondary | ICD-10-CM | POA: Diagnosis not present

## 2011-07-02 DIAGNOSIS — M659 Synovitis and tenosynovitis, unspecified: Secondary | ICD-10-CM | POA: Diagnosis not present

## 2011-07-09 DIAGNOSIS — M65979 Unspecified synovitis and tenosynovitis, unspecified ankle and foot: Secondary | ICD-10-CM | POA: Diagnosis not present

## 2011-07-09 DIAGNOSIS — M722 Plantar fascial fibromatosis: Secondary | ICD-10-CM | POA: Diagnosis not present

## 2011-07-09 DIAGNOSIS — M7150 Other bursitis, not elsewhere classified, unspecified site: Secondary | ICD-10-CM | POA: Diagnosis not present

## 2011-07-09 DIAGNOSIS — M659 Synovitis and tenosynovitis, unspecified: Secondary | ICD-10-CM | POA: Diagnosis not present

## 2011-07-16 ENCOUNTER — Encounter: Payer: Self-pay | Admitting: Family

## 2011-07-16 ENCOUNTER — Ambulatory Visit (INDEPENDENT_AMBULATORY_CARE_PROVIDER_SITE_OTHER): Payer: Medicare Other | Admitting: Family

## 2011-07-16 VITALS — BP 110/80 | Temp 97.7°F | Wt 185.0 lb

## 2011-07-16 DIAGNOSIS — M79606 Pain in leg, unspecified: Secondary | ICD-10-CM

## 2011-07-16 DIAGNOSIS — M79609 Pain in unspecified limb: Secondary | ICD-10-CM

## 2011-07-16 DIAGNOSIS — F411 Generalized anxiety disorder: Secondary | ICD-10-CM

## 2011-07-16 DIAGNOSIS — F419 Anxiety disorder, unspecified: Secondary | ICD-10-CM

## 2011-07-16 DIAGNOSIS — R609 Edema, unspecified: Secondary | ICD-10-CM | POA: Diagnosis not present

## 2011-07-16 LAB — BASIC METABOLIC PANEL
CO2: 28 mEq/L (ref 19–32)
Calcium: 8.9 mg/dL (ref 8.4–10.5)
GFR: 91.39 mL/min (ref >60.00)
Potassium: 4.6 mEq/L (ref 3.5–5.1)
Sodium: 144 mEq/L (ref 135–145)

## 2011-07-16 LAB — POCT URINALYSIS DIPSTICK
Glucose, UA: NEGATIVE
Leukocytes, UA: NEGATIVE
Protein, UA: NEGATIVE
Urobilinogen, UA: 0.2

## 2011-07-16 MED ORDER — DESONIDE 0.05 % EX CREA: 1.0000 "application " | TOPICAL_CREAM | Freq: Two times a day (BID) | CUTANEOUS | Status: DC

## 2011-07-16 NOTE — Progress Notes (Signed)
Subjective:    Patient ID: David Armstrong, male    DOB: 28-Aug-1950, 61 y.o.   MRN: [id number]  HPI 61 year old white male, nonsmoker, patient of Dr. Cato Mulligan in today with complaints of lower extremity cramping and edema has been blocked for a week. However, today he woke up with no swelling and no cramping. He does admit to an increase in sodium intake. He has a history of peripheral neuropathy. Patient denies any lightheadedness, dizziness, chest pain, palpitations, shortness of breath. He is also requesting a refill on his desonide cream.   Review of Systems  Constitutional: Negative.   Respiratory: Negative.   Cardiovascular: Positive for leg swelling.  Musculoskeletal: Positive for arthralgias.       Bilateral leg cramping  Neurological: Negative.   Hematological: Negative.   Psychiatric/Behavioral: Negative.    Past Medical History  Diagnosis Date  . ABDOMINAL PAIN   . ANXIETY   . Personal history of colonic polyps 10/27/2007    HYPERPLASTIC POLYP  . DEPRESSION   . DIVERTICULOSIS, COLON   . GERD   . HYPERLIPIDEMIA   . RENAL CALCULUS   . IBS (irritable bowel syndrome)   . Hiatal hernia   . H. pylori infection   . Arthritis   . Kidney stones   . Anal fissure     History   Social History  . Marital Status: Single    Spouse Name: N/A    Number of Children: 0  . Years of Education: N/A   Occupational History  . retired [employer]    disabled now   Social History Main Topics  . Smoking status: Never Smoker   . Smokeless tobacco: Never Used  . Alcohol Use: No  . Drug Use: No  . Sexually Active: Not on file   Other Topics Concern  . Not on file   Social History Narrative  . No narrative on file    Past Surgical History  Procedure Date  . Knee surgery     both knees  . Shoulder surgery 07/2005, 03/2011  . Foot surgery     x 4  . Hammer toe surgery   . Lithotripsy     Family History  Problem Relation Age of Onset  . Heart disease Mother   .  Cirrhosis Father   . Colon cancer Neg Hx     Allergies  Allergen Reactions  . Ivp Dye (Iodinated Diagnostic Agents)   . Soma (Carisoprodol)     Sores on arm  . Sulfamethoxazole     REACTION: unspecified    Current Outpatient Prescriptions on File Prior to Visit  Medication Sig Dispense Refill  . esomeprazole (NEXIUM) 40 MG capsule Take 1 capsule (40 mg total) by mouth daily as needed.  30 capsule  1  . LORazepam (ATIVAN) 1 MG tablet Take 1 mg by mouth every 8 (eight) hours.        Current Facility-Administered Medications on File Prior to Visit  Medication Dose Route Frequency Provider Last Rate Last Dose  . 0.9 %  sodium chloride infusion  500 mL Intravenous Continuous Mardella Layman, MD        BP 110/80  Temp(Src) 97.7 F (36.5 C) (Oral)  Wt 185 lb (83.915 kg)chart    Objective:   Physical Exam  Constitutional: He is oriented to person, place, and time. He appears well-developed and well-nourished.  HENT:  Right Ear: External ear normal.  Left Ear: External ear normal.  Nose: Nose normal.  Mouth/Throat:  Oropharynx is clear and moist.  Neck: Normal range of motion. Neck supple.  Cardiovascular: Normal rate, regular rhythm and normal heart sounds.   Pulmonary/Chest: Effort normal and breath sounds normal.  Musculoskeletal: Normal range of motion.  Neurological: He is alert and oriented to person, place, and time.  Skin: Skin is warm and dry.  Psychiatric: He has a normal mood and affect.          Assessment & Plan:  Assessment: Edema, leg pain-resolved, atopic dermatitis  Plan: Labs today to include BMP, TSH, urinalysis notify patient of results. Encouraged low sodium diet. Patient is to return if his symptoms worsen or persist. He has no evidence of swelling and no pain today. Recheck with PCP as scheduled.

## 2011-07-16 NOTE — Patient Instructions (Addendum)
Sodium-Controlled Diet       Sodium is a mineral. It is found in many foods. Sodium may be found naturally or added during the making of a food. The most common form of sodium is salt, which is made up of sodium and chloride. Reducing your sodium intake involves changing your eating habits.   The following guidelines will help you reduce the sodium in your diet:     Stop using the salt shaker.   Use salt sparingly in cooking and baking.   Substitute with sodium-free seasonings and spices.   Do not use a salt substitute (potassium chloride) without your caregiver's permission.   Include a variety of fresh, unprocessed foods in your diet.   Limit the use of processed and convenience foods that are high in sodium.    USE THE FOLLOWING FOODS SPARINGLY:     Breads/Starches     Commercial bread stuffing, commercial pancake or waffle mixes, coating mixes. Waffles. Croutons. Prepared (boxed or frozen) potato, rice, or noodle mixes that contain salt or sodium. Salted French fries or hash browns. Salted popcorn, breads, crackers, chips, or snack foods.    Vegetables     Vegetables canned with salt or prepared in cream, butter, or cheese sauces. Sauerkraut. Tomato or vegetable juices canned with salt.   Fresh vegetables are allowed if rinsed thoroughly.    Fruit     Fruit is okay to eat.    Meat and Meat Substitutes     Salted or smoked meats, such as bacon or Canadian bacon, chipped or corned beef, hot dogs, salt pork, luncheon meats, pastrami, ham, or sausage. Canned or smoked fish, poultry, or meat. Processed cheese or cheese spreads, blue or Roquefort cheese. Battered or frozen fish products. Prepared spaghetti sauce. Baked beans. Reuben sandwiches. Salted nuts. Caviar.  Milk     Limit buttermilk to 1 cup per week.    Soups and Combination Foods     Bouillon cubes, canned or dried soups, broth, consomm. Convenience (frozen or packaged) dinners with more than 600 mg sodium. Pot pies, pizza, Asian food, fast food  cheeseburgers, and specialty sandwiches.  Desserts and Sweets     Regular (salted) desserts, pie, commercial fruit snack pies, commercial snack cakes, canned puddings.   Eat desserts and sweets in moderation.    Fats and Oils     Gravy mixes or canned gravy. No more than 1 to 2 tbs of salad dressing. Chip dips.   Eat fats and oils in moderation.    Beverages     See those listed under the vegetables and milk groups.    Condiments     Ketchup, mustard, meat sauces, salsa, regular (salted) and lite soy sauce or mustard. Dill pickles, olives, meat tenderizer. Prepared horseradish or pickle relish. Dutch-processed cocoa. Baking powder or baking soda used medicinally. Worcestershire sauce. "Light" salt. Salt substitute, unless approved by your caregiver.      Document Released: 09/20/2001 Document Revised: 03/20/2011 Document Reviewed: 04/23/2009   [employer] Patient Information 2012 [employer].

## 2011-07-23 DIAGNOSIS — M659 Synovitis and tenosynovitis, unspecified: Secondary | ICD-10-CM | POA: Diagnosis not present

## 2011-07-23 DIAGNOSIS — M65979 Unspecified synovitis and tenosynovitis, unspecified ankle and foot: Secondary | ICD-10-CM | POA: Diagnosis not present

## 2011-07-23 DIAGNOSIS — M722 Plantar fascial fibromatosis: Secondary | ICD-10-CM | POA: Diagnosis not present

## 2011-07-23 DIAGNOSIS — M7150 Other bursitis, not elsewhere classified, unspecified site: Secondary | ICD-10-CM | POA: Diagnosis not present

## 2011-07-30 DIAGNOSIS — M9981 Other biomechanical lesions of cervical region: Secondary | ICD-10-CM | POA: Diagnosis not present

## 2011-07-30 DIAGNOSIS — M999 Biomechanical lesion, unspecified: Secondary | ICD-10-CM | POA: Diagnosis not present

## 2011-07-30 DIAGNOSIS — M5137 Other intervertebral disc degeneration, lumbosacral region: Secondary | ICD-10-CM | POA: Diagnosis not present

## 2011-07-31 DIAGNOSIS — M9981 Other biomechanical lesions of cervical region: Secondary | ICD-10-CM | POA: Diagnosis not present

## 2011-07-31 DIAGNOSIS — M5137 Other intervertebral disc degeneration, lumbosacral region: Secondary | ICD-10-CM | POA: Diagnosis not present

## 2011-07-31 DIAGNOSIS — M999 Biomechanical lesion, unspecified: Secondary | ICD-10-CM | POA: Diagnosis not present

## 2011-08-04 DIAGNOSIS — M9981 Other biomechanical lesions of cervical region: Secondary | ICD-10-CM | POA: Diagnosis not present

## 2011-08-04 DIAGNOSIS — M999 Biomechanical lesion, unspecified: Secondary | ICD-10-CM | POA: Diagnosis not present

## 2011-08-04 DIAGNOSIS — M5137 Other intervertebral disc degeneration, lumbosacral region: Secondary | ICD-10-CM | POA: Diagnosis not present

## 2011-08-05 DIAGNOSIS — L708 Other acne: Secondary | ICD-10-CM | POA: Diagnosis not present

## 2011-08-06 DIAGNOSIS — M5137 Other intervertebral disc degeneration, lumbosacral region: Secondary | ICD-10-CM | POA: Diagnosis not present

## 2011-08-06 DIAGNOSIS — M9981 Other biomechanical lesions of cervical region: Secondary | ICD-10-CM | POA: Diagnosis not present

## 2011-08-06 DIAGNOSIS — M999 Biomechanical lesion, unspecified: Secondary | ICD-10-CM | POA: Diagnosis not present

## 2011-08-07 DIAGNOSIS — M999 Biomechanical lesion, unspecified: Secondary | ICD-10-CM | POA: Diagnosis not present

## 2011-08-07 DIAGNOSIS — M9981 Other biomechanical lesions of cervical region: Secondary | ICD-10-CM | POA: Diagnosis not present

## 2011-08-07 DIAGNOSIS — M5137 Other intervertebral disc degeneration, lumbosacral region: Secondary | ICD-10-CM | POA: Diagnosis not present

## 2011-08-11 DIAGNOSIS — M9981 Other biomechanical lesions of cervical region: Secondary | ICD-10-CM | POA: Diagnosis not present

## 2011-08-11 DIAGNOSIS — M5137 Other intervertebral disc degeneration, lumbosacral region: Secondary | ICD-10-CM | POA: Diagnosis not present

## 2011-08-11 DIAGNOSIS — M999 Biomechanical lesion, unspecified: Secondary | ICD-10-CM | POA: Diagnosis not present

## 2011-08-13 DIAGNOSIS — M5137 Other intervertebral disc degeneration, lumbosacral region: Secondary | ICD-10-CM | POA: Diagnosis not present

## 2011-08-13 DIAGNOSIS — M999 Biomechanical lesion, unspecified: Secondary | ICD-10-CM | POA: Diagnosis not present

## 2011-08-13 DIAGNOSIS — M9981 Other biomechanical lesions of cervical region: Secondary | ICD-10-CM | POA: Diagnosis not present

## 2011-08-18 DIAGNOSIS — M5137 Other intervertebral disc degeneration, lumbosacral region: Secondary | ICD-10-CM | POA: Diagnosis not present

## 2011-08-18 DIAGNOSIS — M9981 Other biomechanical lesions of cervical region: Secondary | ICD-10-CM | POA: Diagnosis not present

## 2011-08-18 DIAGNOSIS — M999 Biomechanical lesion, unspecified: Secondary | ICD-10-CM | POA: Diagnosis not present

## 2011-08-21 DIAGNOSIS — M9981 Other biomechanical lesions of cervical region: Secondary | ICD-10-CM | POA: Diagnosis not present

## 2011-08-21 DIAGNOSIS — M5137 Other intervertebral disc degeneration, lumbosacral region: Secondary | ICD-10-CM | POA: Diagnosis not present

## 2011-08-21 DIAGNOSIS — M999 Biomechanical lesion, unspecified: Secondary | ICD-10-CM | POA: Diagnosis not present

## 2011-08-25 DIAGNOSIS — M5412 Radiculopathy, cervical region: Secondary | ICD-10-CM | POA: Diagnosis not present

## 2011-08-25 DIAGNOSIS — IMO0002 Reserved for concepts with insufficient information to code with codable children: Secondary | ICD-10-CM | POA: Diagnosis not present

## 2011-08-25 DIAGNOSIS — G5610 Other lesions of median nerve, unspecified upper limb: Secondary | ICD-10-CM | POA: Diagnosis not present

## 2011-08-25 DIAGNOSIS — G609 Hereditary and idiopathic neuropathy, unspecified: Secondary | ICD-10-CM | POA: Diagnosis not present

## 2011-08-27 DIAGNOSIS — M9981 Other biomechanical lesions of cervical region: Secondary | ICD-10-CM | POA: Diagnosis not present

## 2011-08-27 DIAGNOSIS — M5137 Other intervertebral disc degeneration, lumbosacral region: Secondary | ICD-10-CM | POA: Diagnosis not present

## 2011-08-27 DIAGNOSIS — M999 Biomechanical lesion, unspecified: Secondary | ICD-10-CM | POA: Diagnosis not present

## 2011-09-01 DIAGNOSIS — M999 Biomechanical lesion, unspecified: Secondary | ICD-10-CM | POA: Diagnosis not present

## 2011-09-01 DIAGNOSIS — M9981 Other biomechanical lesions of cervical region: Secondary | ICD-10-CM | POA: Diagnosis not present

## 2011-09-01 DIAGNOSIS — M5137 Other intervertebral disc degeneration, lumbosacral region: Secondary | ICD-10-CM | POA: Diagnosis not present

## 2011-09-03 ENCOUNTER — Telehealth: Payer: Self-pay

## 2011-09-03 DIAGNOSIS — M5137 Other intervertebral disc degeneration, lumbosacral region: Secondary | ICD-10-CM | POA: Diagnosis not present

## 2011-09-03 DIAGNOSIS — M999 Biomechanical lesion, unspecified: Secondary | ICD-10-CM | POA: Diagnosis not present

## 2011-09-03 DIAGNOSIS — M9981 Other biomechanical lesions of cervical region: Secondary | ICD-10-CM | POA: Diagnosis not present

## 2011-09-03 NOTE — Telephone Encounter (Signed)
Pt has used the lotion in the past and was seen by [facility] in April

## 2011-09-03 NOTE — Telephone Encounter (Signed)
Last seen by P.Orvan Falconer , was given rx for desonide cream - pt is requesting lotion instead of cream. Send new rx to [facility] in [place]. If not ok to change please call pt to notify.

## 2011-09-04 DIAGNOSIS — M9981 Other biomechanical lesions of cervical region: Secondary | ICD-10-CM | POA: Diagnosis not present

## 2011-09-04 DIAGNOSIS — M999 Biomechanical lesion, unspecified: Secondary | ICD-10-CM | POA: Diagnosis not present

## 2011-09-04 DIAGNOSIS — M5137 Other intervertebral disc degeneration, lumbosacral region: Secondary | ICD-10-CM | POA: Diagnosis not present

## 2011-09-05 ENCOUNTER — Other Ambulatory Visit: Payer: Self-pay | Admitting: *Deleted

## 2011-09-05 MED ORDER — DESONIDE 0.05 % EX LOTN: TOPICAL_LOTION | Freq: Two times a day (BID) | CUTANEOUS | Status: DC

## 2011-09-09 ENCOUNTER — Other Ambulatory Visit: Payer: Self-pay | Admitting: Family

## 2011-09-09 DIAGNOSIS — M999 Biomechanical lesion, unspecified: Secondary | ICD-10-CM | POA: Diagnosis not present

## 2011-09-09 DIAGNOSIS — M5137 Other intervertebral disc degeneration, lumbosacral region: Secondary | ICD-10-CM | POA: Diagnosis not present

## 2011-09-09 DIAGNOSIS — M9981 Other biomechanical lesions of cervical region: Secondary | ICD-10-CM | POA: Diagnosis not present

## 2011-09-09 MED ORDER — DESONIDE 0.05 % EX LOTN: TOPICAL_LOTION | Freq: Two times a day (BID) | CUTANEOUS | Status: AC

## 2011-09-09 NOTE — Telephone Encounter (Signed)
done

## 2011-09-11 DIAGNOSIS — M999 Biomechanical lesion, unspecified: Secondary | ICD-10-CM | POA: Diagnosis not present

## 2011-09-11 DIAGNOSIS — M9981 Other biomechanical lesions of cervical region: Secondary | ICD-10-CM | POA: Diagnosis not present

## 2011-09-11 DIAGNOSIS — M5137 Other intervertebral disc degeneration, lumbosacral region: Secondary | ICD-10-CM | POA: Diagnosis not present

## 2011-09-15 DIAGNOSIS — M9981 Other biomechanical lesions of cervical region: Secondary | ICD-10-CM | POA: Diagnosis not present

## 2011-09-15 DIAGNOSIS — M5137 Other intervertebral disc degeneration, lumbosacral region: Secondary | ICD-10-CM | POA: Diagnosis not present

## 2011-09-15 DIAGNOSIS — M999 Biomechanical lesion, unspecified: Secondary | ICD-10-CM | POA: Diagnosis not present

## 2011-09-18 DIAGNOSIS — M9981 Other biomechanical lesions of cervical region: Secondary | ICD-10-CM | POA: Diagnosis not present

## 2011-09-18 DIAGNOSIS — M999 Biomechanical lesion, unspecified: Secondary | ICD-10-CM | POA: Diagnosis not present

## 2011-09-18 DIAGNOSIS — M5137 Other intervertebral disc degeneration, lumbosacral region: Secondary | ICD-10-CM | POA: Diagnosis not present

## 2011-09-25 DIAGNOSIS — G5600 Carpal tunnel syndrome, unspecified upper limb: Secondary | ICD-10-CM | POA: Diagnosis not present

## 2011-09-25 DIAGNOSIS — M5412 Radiculopathy, cervical region: Secondary | ICD-10-CM | POA: Diagnosis not present

## 2011-09-25 DIAGNOSIS — M542 Cervicalgia: Secondary | ICD-10-CM | POA: Diagnosis not present

## 2011-09-25 DIAGNOSIS — IMO0002 Reserved for concepts with insufficient information to code with codable children: Secondary | ICD-10-CM | POA: Diagnosis not present

## 2011-10-21 DIAGNOSIS — D2330 Other benign neoplasm of skin of unspecified part of face: Secondary | ICD-10-CM | POA: Diagnosis not present

## 2011-10-21 DIAGNOSIS — L570 Actinic keratosis: Secondary | ICD-10-CM | POA: Diagnosis not present

## 2011-11-04 ENCOUNTER — Encounter: Payer: Self-pay | Admitting: *Deleted

## 2011-11-06 ENCOUNTER — Ambulatory Visit (INDEPENDENT_AMBULATORY_CARE_PROVIDER_SITE_OTHER): Payer: Medicare Other | Admitting: Gastroenterology

## 2011-11-06 ENCOUNTER — Encounter: Payer: Self-pay | Admitting: Gastroenterology

## 2011-11-06 VITALS — BP 100/68 | HR 80 | Ht 71.0 in | Wt 183.4 lb

## 2011-11-06 DIAGNOSIS — K6289 Other specified diseases of anus and rectum: Secondary | ICD-10-CM

## 2011-11-06 DIAGNOSIS — K589 Irritable bowel syndrome without diarrhea: Secondary | ICD-10-CM

## 2011-11-06 DIAGNOSIS — K648 Other hemorrhoids: Secondary | ICD-10-CM | POA: Diagnosis not present

## 2011-11-06 DIAGNOSIS — Z1211 Encounter for screening for malignant neoplasm of colon: Secondary | ICD-10-CM | POA: Diagnosis not present

## 2011-11-06 MED ORDER — ZINC OXIDE 12.8 % EX OINT: TOPICAL_OINTMENT | CUTANEOUS | Status: DC

## 2011-11-06 MED ORDER — MESALAMINE 1000 MG RE SUPP: 1000.0000 mg | Freq: Every day | RECTAL | Status: DC

## 2011-11-06 NOTE — Patient Instructions (Addendum)
Your physician has requested that you go to the basement for the following lab work before leaving today: Ifob. We have sent the following medications to your pharmacy for you to pick up at your convenience: Canasa suppository, Triple paste. Take Metamucil over the counter daily.   High Fiber Diet A high fiber diet changes your normal diet to include more whole grains, legumes, fruits, and vegetables. Changes in the diet involve replacing refined carbohydrates with unrefined foods. The calorie level of the diet is essentially unchanged. The Dietary Reference Intake (recommended amount) for adult males is 38 g per day. For adult females, it is 25 g per day. Pregnant and lactating women should consume 28 g of fiber per day. Fiber is the intact part of a plant that is not broken down during digestion. Functional fiber is fiber that has been isolated from the plant to provide a beneficial effect in the body. PURPOSE  Increase stool bulk.   Ease and regulate bowel movements.   Lower cholesterol.  INDICATIONS THAT YOU NEED MORE FIBER  Constipation and hemorrhoids.   Uncomplicated diverticulosis (intestine condition) and irritable bowel syndrome.   Weight management.   As a protective measure against hardening of the arteries (atherosclerosis), diabetes, and cancer.  NOTE OF CAUTION If you have a digestive or bowel problem, ask your caregiver for advice before adding high fiber foods to your diet. Some of the following medical problems are such that a high fiber diet should not be used without consulting your caregiver:  Acute diverticulitis (intestine infection).   Partial small bowel obstructions.   Complicated diverticular disease involving bleeding, rupture (perforation), or abscess (boil, furuncle).   Presence of autonomic neuropathy (nerve damage) or gastric paresis (stomach cannot empty itself).  GUIDELINES FOR INCREASING FIBER  Start adding fiber to the diet slowly. A gradual  increase of about 5 more grams (2 slices of whole-wheat bread, 2 servings of most fruits or vegetables, or 1 bowl of high fiber cereal) per day is best. Too rapid an increase in fiber may result in constipation, flatulence, and bloating.   Drink enough water and fluids to keep your urine clear or pale yellow. Water, juice, or caffeine-free drinks are recommended. Not drinking enough fluid may cause constipation.   Eat a variety of high fiber foods rather than one type of fiber.   Try to increase your intake of fiber through using high fiber foods rather than fiber pills or supplements that contain small amounts of fiber.   The goal is to change the types of food eaten. Do not supplement your present diet with high fiber foods, but replace foods in your present diet.  INCLUDE A VARIETY OF FIBER SOURCES  Replace refined and processed grains with whole grains, canned fruits with fresh fruits, and incorporate other fiber sources. White rice, white breads, and most bakery goods contain little or no fiber.   Brown whole-grain rice, buckwheat oats, and many fruits and vegetables are all good sources of fiber. These include: broccoli, Brussels sprouts, cabbage, cauliflower, beets, sweet potatoes, white potatoes (skin on), carrots, tomatoes, eggplant, squash, berries, fresh fruits, and dried fruits.   Cereals appear to be the richest source of fiber. Cereal fiber is found in whole grains and bran. Bran is the fiber-rich outer coat of cereal grain, which is largely removed in refining. In whole-grain cereals, the bran remains. In breakfast cereals, the largest amount of fiber is found in those with "bran" in their names. The fiber content is sometimes indicated  on the label.   You may need to include additional fruits and vegetables each day.   In baking, for 1 cup white flour, you may use the following substitutions:   1 cup whole-wheat flour minus 2 tbs.    cup white flour plus  cup whole-wheat  flour.  Document Released: 03/31/2005 Document Revised: 03/20/2011 Document Reviewed: 02/06/2009 [facility] Patient Information 2012 [place], [place].   cc: Birdie Sons, MD

## 2011-11-06 NOTE — Progress Notes (Signed)
This is a 61 year old Caucasian male with chronic IBS and idiopathic recurrent rectal pain. He is up-to-date on his endoscopy and colonoscopy exams. He currently describes persistent rectal pain, possible rectal bleeding, and bowel or regularity without significant abdominal pain. He previously was on Nexium for GERD, but is not on that medication currently. He denies systemic complaints, hepatobiliary complaints, fever, chills, anorexia or weight loss.  Current Medications, Allergies, Past Medical History, Past Surgical History, Family History and Social History were reviewed in [employer] record.  Pertinent Review of Systems Negative   Physical Exam: Blood pressure 100/68, pulse 80 and regular, weight 183 with a BMI of 25.58. Abdominal exam is entirely unremarkable without organomegaly, masses or tenderness. Inspection the rectum shows some slight perianal erythema but no nodularity, or fistulae, or evidence of cellulitis. Anoscopic exam shows 2 prominent internal hemorrhoid columns without stigmata of recent bleeding. I cannot appreciate any other abnormalities. Mental status is normal.    Assessment and Plan: Recurrent discomfort from inflamed internal hemorrhoids. I have prescribed Canasa 1 g suppositories at bedtime with Triple Paste locally twice a day to his perianal area. Also he is to follow a high fiber diet with daily Citrucel liberal by mouth fluids.IFOB cards to be done. No diagnosis found.

## 2011-11-08 ENCOUNTER — Other Ambulatory Visit: Payer: Self-pay | Admitting: Gastroenterology

## 2011-11-08 ENCOUNTER — Telehealth: Payer: Self-pay | Admitting: Gastroenterology

## 2011-11-08 MED ORDER — HYDROCORTISONE ACETATE 25 MG RE SUPP: 25.0000 mg | Freq: Two times a day (BID) | RECTAL | Status: AC

## 2011-11-08 NOTE — Telephone Encounter (Signed)
Rowasa supp too expensive. Anusol HC supp called in

## 2011-11-11 ENCOUNTER — Other Ambulatory Visit: Payer: Medicare Other

## 2011-11-11 DIAGNOSIS — Z1211 Encounter for screening for malignant neoplasm of colon: Secondary | ICD-10-CM

## 2011-11-17 ENCOUNTER — Telehealth: Payer: Self-pay | Admitting: Gastroenterology

## 2011-11-17 NOTE — Telephone Encounter (Signed)
Informed pt his stool was negative for blood; pt stated understanding.

## 2011-12-16 DIAGNOSIS — D2330 Other benign neoplasm of skin of unspecified part of face: Secondary | ICD-10-CM | POA: Diagnosis not present

## 2012-01-01 DIAGNOSIS — G5600 Carpal tunnel syndrome, unspecified upper limb: Secondary | ICD-10-CM | POA: Diagnosis not present

## 2012-01-01 DIAGNOSIS — IMO0002 Reserved for concepts with insufficient information to code with codable children: Secondary | ICD-10-CM | POA: Diagnosis not present

## 2012-01-01 DIAGNOSIS — M542 Cervicalgia: Secondary | ICD-10-CM | POA: Diagnosis not present

## 2012-01-01 DIAGNOSIS — G609 Hereditary and idiopathic neuropathy, unspecified: Secondary | ICD-10-CM | POA: Diagnosis not present

## 2012-01-06 DIAGNOSIS — D235 Other benign neoplasm of skin of trunk: Secondary | ICD-10-CM | POA: Diagnosis not present

## 2012-01-27 DIAGNOSIS — Z125 Encounter for screening for malignant neoplasm of prostate: Secondary | ICD-10-CM | POA: Diagnosis not present

## 2012-01-27 DIAGNOSIS — N200 Calculus of kidney: Secondary | ICD-10-CM | POA: Diagnosis not present

## 2012-01-27 DIAGNOSIS — M545 Low back pain: Secondary | ICD-10-CM | POA: Diagnosis not present

## 2012-02-24 DIAGNOSIS — L538 Other specified erythematous conditions: Secondary | ICD-10-CM | POA: Diagnosis not present

## 2012-03-09 ENCOUNTER — Ambulatory Visit (INDEPENDENT_AMBULATORY_CARE_PROVIDER_SITE_OTHER): Payer: Medicare Other | Admitting: Gastroenterology

## 2012-03-09 ENCOUNTER — Encounter: Payer: Self-pay | Admitting: Gastroenterology

## 2012-03-09 VITALS — BP 100/70 | HR 50 | Resp 97 | Ht 71.0 in | Wt 184.6 lb

## 2012-03-09 DIAGNOSIS — K589 Irritable bowel syndrome without diarrhea: Secondary | ICD-10-CM | POA: Diagnosis not present

## 2012-03-09 DIAGNOSIS — K649 Unspecified hemorrhoids: Secondary | ICD-10-CM | POA: Diagnosis not present

## 2012-03-09 DIAGNOSIS — K624 Stenosis of anus and rectum: Secondary | ICD-10-CM | POA: Diagnosis not present

## 2012-03-09 DIAGNOSIS — G8929 Other chronic pain: Secondary | ICD-10-CM

## 2012-03-09 MED ORDER — HYDROCORTISONE ACETATE 25 MG RE SUPP: RECTAL | Status: DC

## 2012-03-09 NOTE — Patient Instructions (Addendum)
We have sent a referral form to [facility] for a AnoRectal Manometry. I will call you back with your appointment information.  We have sent the following medications to your pharmacy for you to pick up at your convenience: Anusol Suppositories. Please take as directed.  Please purchase Metamucil over the counter. Take as directed.

## 2012-03-09 NOTE — Progress Notes (Signed)
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  He has seborrheic dermatitis of his face and scalp followed by dermatology, but otherwise is in good health.  He is followed by urology for an elevated PSA.  I've known him for many years, and he has his chronic discomfort in his anorectal area without any definitive diagnosis.  He had colonoscopy as recently as one year ago that was unremarkable.  He does suffer from diverticulosis and mild constipation.  Currently is using zinc oxide paste to his  perianal area, but continues to complain of discomfort ianditching.  He denies significant abdominal pain or any current genitourinary or general medical symptoms otherwise.  Current Medications, Allergies, Past Medical History, Past Surgical History, Family History and Social History were reviewed in [employer] record.  Pertinent Review of Systems Negative    Physical Exam: Blood pressure 100/70, pulse 50 and regular, and weight 184 the BMI of 27.75.  I cannot appreciate stigmata of chronic liver disease or active skin lesions.  His abdominal exam shows no organomegaly, masses or tenderness.  Inspection of his rectum is unremarkable without obvious rashes, fissures or fistulae.  ANOSCOPY: Anoscopy was generally unremarkable with a small posterior lateral internal hemorrhoid noted.  I could not appreciate any fissures, or fistulae, or other abnormalities.  Repeat digital exam also showed no masses or significant tenderness or prostate enlargement.   Assessment and Plan: Chronic rectal pain of unexplained etiology, probable mostly functional in nature.  I have scheduled him for anorectal manometry at [facility] because of the chronicity of his symptoms.  In the interim, I have asked him to use Anusol-HC suppositories at bedtime for 2 weeks, and to take daily Metamucil with high fiber diet. No diagnosis found.

## 2012-03-24 DIAGNOSIS — G5600 Carpal tunnel syndrome, unspecified upper limb: Secondary | ICD-10-CM | POA: Diagnosis not present

## 2012-03-24 DIAGNOSIS — M5412 Radiculopathy, cervical region: Secondary | ICD-10-CM | POA: Diagnosis not present

## 2012-03-24 DIAGNOSIS — IMO0002 Reserved for concepts with insufficient information to code with codable children: Secondary | ICD-10-CM | POA: Diagnosis not present

## 2012-03-30 DIAGNOSIS — B079 Viral wart, unspecified: Secondary | ICD-10-CM | POA: Diagnosis not present

## 2012-03-30 DIAGNOSIS — D235 Other benign neoplasm of skin of trunk: Secondary | ICD-10-CM | POA: Diagnosis not present

## 2012-04-01 ENCOUNTER — Telehealth: Payer: Self-pay | Admitting: Gastroenterology

## 2012-04-01 MED ORDER — ESOMEPRAZOLE MAGNESIUM 40 MG PO CPDR: 40.0000 mg | DELAYED_RELEASE_CAPSULE | Freq: Every day | ORAL | Status: DC

## 2012-04-01 NOTE — Telephone Encounter (Signed)
Pt has been on Aciphex and Prilosec in the past per Epic. lmom for pt to call back.

## 2012-04-01 NOTE — Telephone Encounter (Signed)
Pt reports he has used Nexium and Prilosec in the past with good results. Informed him I will leave him samples of Prilosec to try and if no help, he can try the Nexium. He will call back with an update.

## 2012-04-20 DIAGNOSIS — G8918 Other acute postprocedural pain: Secondary | ICD-10-CM | POA: Diagnosis not present

## 2012-04-20 DIAGNOSIS — M75100 Unspecified rotator cuff tear or rupture of unspecified shoulder, not specified as traumatic: Secondary | ICD-10-CM | POA: Diagnosis not present

## 2012-04-20 DIAGNOSIS — M7500 Adhesive capsulitis of unspecified shoulder: Secondary | ICD-10-CM | POA: Diagnosis not present

## 2012-04-20 DIAGNOSIS — M24119 Other articular cartilage disorders, unspecified shoulder: Secondary | ICD-10-CM | POA: Diagnosis not present

## 2012-04-22 ENCOUNTER — Telehealth: Payer: Self-pay | Admitting: Gastroenterology

## 2012-04-22 NOTE — Telephone Encounter (Signed)
Pt states that the Nexium once daily really has not helped him that much for his reflux. He had surgery on his shoulder and the ortho doc wants him to take naprosyn 500mg  BID for 30days. Pt is nervous about taking this because of his stomach. Dr. Jarold Motto please advise.

## 2012-04-23 MED ORDER — DEXLANSOPRAZOLE 60 MG PO CPDR: DELAYED_RELEASE_CAPSULE | ORAL | Status: DC

## 2012-04-23 NOTE — Telephone Encounter (Signed)
Informed pt to take Nexium as long as he is on pain meds/naprosyn. He only has prilosec; informed him I will leave samples at the front desk. We had no Nexium so I left 20 Dexilant for him to take x 20 days and his pain should be better; pt stated understanding.

## 2012-04-23 NOTE — Telephone Encounter (Signed)
Ok; on nexium.

## 2012-04-26 DIAGNOSIS — M7500 Adhesive capsulitis of unspecified shoulder: Secondary | ICD-10-CM | POA: Diagnosis not present

## 2012-04-28 DIAGNOSIS — M7500 Adhesive capsulitis of unspecified shoulder: Secondary | ICD-10-CM | POA: Diagnosis not present

## 2012-05-03 DIAGNOSIS — M7500 Adhesive capsulitis of unspecified shoulder: Secondary | ICD-10-CM | POA: Diagnosis not present

## 2012-05-04 DIAGNOSIS — L219 Seborrheic dermatitis, unspecified: Secondary | ICD-10-CM | POA: Diagnosis not present

## 2012-05-06 DIAGNOSIS — M7500 Adhesive capsulitis of unspecified shoulder: Secondary | ICD-10-CM | POA: Diagnosis not present

## 2012-05-10 DIAGNOSIS — M7500 Adhesive capsulitis of unspecified shoulder: Secondary | ICD-10-CM | POA: Diagnosis not present

## 2012-05-13 DIAGNOSIS — M7500 Adhesive capsulitis of unspecified shoulder: Secondary | ICD-10-CM | POA: Diagnosis not present

## 2012-05-17 DIAGNOSIS — M7500 Adhesive capsulitis of unspecified shoulder: Secondary | ICD-10-CM | POA: Diagnosis not present

## 2012-05-20 DIAGNOSIS — M7500 Adhesive capsulitis of unspecified shoulder: Secondary | ICD-10-CM | POA: Diagnosis not present

## 2012-05-24 DIAGNOSIS — M7500 Adhesive capsulitis of unspecified shoulder: Secondary | ICD-10-CM | POA: Diagnosis not present

## 2012-06-01 DIAGNOSIS — L570 Actinic keratosis: Secondary | ICD-10-CM | POA: Diagnosis not present

## 2012-06-01 DIAGNOSIS — D235 Other benign neoplasm of skin of trunk: Secondary | ICD-10-CM | POA: Diagnosis not present

## 2012-06-07 ENCOUNTER — Telehealth: Payer: Self-pay | Admitting: Gastroenterology

## 2012-06-07 NOTE — Telephone Encounter (Signed)
lmom for pt to call back

## 2012-06-08 NOTE — Telephone Encounter (Signed)
Pt reports he has a bump on the anal/rectal area that is hurting; he know he has an internal hemorrhoid, but he doesn't know if this is external. He has tried hydrocortisone suppositories with a little relief. Asked pt if he wanted a refill or if he would like to be seen. Pt stated he called and was given an appt in mid March with Dr Jarold Motto. We cancelled that appt and he will see Willette Cluster, NP tomorrow.

## 2012-06-09 ENCOUNTER — Encounter: Payer: Self-pay | Admitting: Nurse Practitioner

## 2012-06-09 ENCOUNTER — Ambulatory Visit (INDEPENDENT_AMBULATORY_CARE_PROVIDER_SITE_OTHER): Payer: Medicare Other | Admitting: Nurse Practitioner

## 2012-06-09 VITALS — BP 98/68 | HR 84 | Ht 70.5 in | Wt 188.5 lb

## 2012-06-09 DIAGNOSIS — K645 Perianal venous thrombosis: Secondary | ICD-10-CM | POA: Insufficient documentation

## 2012-06-09 MED ORDER — LIDOCAINE-HYDROCORTISONE ACE 3-2.5 % RE KIT: 5.0000 mL | PACK | Freq: Two times a day (BID) | RECTAL | Status: DC

## 2012-06-09 NOTE — Patient Instructions (Addendum)
We sent a prescription to [facility] for Anamantle Lidocaine cream. Take 1 dose of Miralax 1 scoop daily. Hold for loose stools. Return in 7-10 days, see Willette Cluster ACNP. Call us back week of 06-14-2012 to make the appointment.

## 2012-06-09 NOTE — Progress Notes (Signed)
06/09/2012 David Armstrong [id number] Nov 19, 1950   History of Present Illness:   Patient is a 62 year old male known to Dr. Jarold Motto for history of GERD ,chronic irritable bowel syndrome and idiopathic recurrent rectal pain. Patient was last seen November 2013 when he presented with persistent perianal discomfort and itching. He was advised to use Anusol HC suppositories , daily Metamucil with a high fiber diet. Patient is worked in today for evaluation of a perianal " bump".  Patient is up-to-date on colon cancer screenings. Last colonoscopy September 2012 with findings of only diverticulosis. Patient discovered this bump a week ago. It doesn't hurt to defecate but sitting is uncomfortable. Patient is slightly constipated. Fiber supplements caused bloating.    Current Medications, Allergies, Past Medical History, Past Surgical History, Family History and Social History were reviewed in [employer] record.   Physical Exam: General: Well developed , white male in no acute distress Head: Normocephalic and atraumatic Abdomen: Soft, non tender and non distended. No masses, no hepatomegaly. Normal bowel sounds Rectal: tender, thrombosed pea sized hemorrhoid,firm but not rock hard.  Musculoskeletal: Symmetrical with no gross deformities  Extremities: No edema  Neurological: Alert oriented x 4, grossly nonfocal Psychological:  Alert and cooperative. Slightly anxious  Assessment and Recommendations:  1. Thrombosed hemorrhoids. Will try and treat conservatively. Patient only has a shower so he cannot take sitz baths. Trial of Anamantle BID. Avoid constipation, see #2. Patient will return to see me in 7-10 days. If no improvement he may need surgical evaluation.   2. Constipation, mild. Cannot tolerate fiber supplements secondary to bloating. Trial of daily miralax

## 2012-06-17 ENCOUNTER — Ambulatory Visit (INDEPENDENT_AMBULATORY_CARE_PROVIDER_SITE_OTHER): Payer: Medicare Other | Admitting: Nurse Practitioner

## 2012-06-17 ENCOUNTER — Encounter: Payer: Self-pay | Admitting: Nurse Practitioner

## 2012-06-17 VITALS — BP 100/78 | HR 72 | Ht 71.5 in | Wt 189.4 lb

## 2012-06-17 DIAGNOSIS — IMO0002 Reserved for concepts with insufficient information to code with codable children: Secondary | ICD-10-CM | POA: Diagnosis not present

## 2012-06-17 DIAGNOSIS — K649 Unspecified hemorrhoids: Secondary | ICD-10-CM | POA: Diagnosis not present

## 2012-06-17 DIAGNOSIS — G609 Hereditary and idiopathic neuropathy, unspecified: Secondary | ICD-10-CM | POA: Diagnosis not present

## 2012-06-17 DIAGNOSIS — K645 Perianal venous thrombosis: Secondary | ICD-10-CM | POA: Diagnosis not present

## 2012-06-17 DIAGNOSIS — M542 Cervicalgia: Secondary | ICD-10-CM | POA: Diagnosis not present

## 2012-06-17 DIAGNOSIS — G5600 Carpal tunnel syndrome, unspecified upper limb: Secondary | ICD-10-CM | POA: Diagnosis not present

## 2012-06-17 NOTE — Patient Instructions (Addendum)
We will call you with an appointment with [facility] for sometime early next week. Use stool softners and keep you stools soft.   Try to avoid straining.

## 2012-06-20 ENCOUNTER — Encounter: Payer: Self-pay | Admitting: Nurse Practitioner

## 2012-06-20 NOTE — Progress Notes (Signed)
06/20/2012 David Armstrong [id number] 07/17/1950   History of Present Illness:  David Armstrong is back for evaluation of thrombosed hemorrhoid. He was here on 06/09/12 at which time he was found to have a thrombosed hemorrhoid. He has been using Anamantle as prescribed but doesn't feel like there has been any improvement. He is not constipated.   Current Medications, Allergies, Past Medical History, Past Surgical History, Family History and Social History were reviewed in [employer] record.   Physical Exam: General: Well developed , white male in no acute distress Abdomen: Soft, non tender and non distended. No masses, no hepatomegaly. Normal bowel sounds Rectal: pea sized, tender thrombosed hemorrhoid. Psychological:  Alert, anxious  Assessment and Recommendations:  Thrombosed hemorrhoid. No improvement after resolution of constipation and several days of Anamantle. He has ongoing perianal pain. Will refer him to surgery for evaluation and treatment.

## 2012-06-22 ENCOUNTER — Ambulatory Visit (INDEPENDENT_AMBULATORY_CARE_PROVIDER_SITE_OTHER): Payer: Medicare Other | Admitting: Surgery

## 2012-06-22 ENCOUNTER — Encounter (INDEPENDENT_AMBULATORY_CARE_PROVIDER_SITE_OTHER): Payer: Self-pay | Admitting: Surgery

## 2012-06-22 VITALS — BP 112/70 | HR 72 | Temp 97.8°F | Resp 14 | Ht 72.0 in | Wt 190.0 lb

## 2012-06-22 DIAGNOSIS — K649 Unspecified hemorrhoids: Secondary | ICD-10-CM

## 2012-06-22 DIAGNOSIS — K645 Perianal venous thrombosis: Secondary | ICD-10-CM | POA: Diagnosis not present

## 2012-06-22 NOTE — Progress Notes (Signed)
Subjective:     Patient ID: David Armstrong, male   DOB: [birth year], 62 y.o.   MRN: [id number]  HPI  David Armstrong  December 20, 1950 [id number]  Patient Care Team: Lindley Magnus, MD as PCP - General Ardeth Sportsman, MD as Consulting Physician (General Surgery) Mardella Layman, MD as Consulting Physician (Gastroenterology) Meredith Pel, NP as Nurse Practitioner (Gastroenterology)  This patient is a 62 y.o.male who presents today for surgical evaluation at the request of Mrs. Wilmon Pali.   Reason for visit: Anal pain.  Probable thrombosed hemorrhoid.  Pleasant male struggle chronic constipation.  History of hemorrhoid problems in the past.  Had a sharp pain.  Went and saw gastroenterology.  Concern for thrombosed hemorrhoid.  Tried AnaMantle HC cream since he was more than a week.  He will struggling with some discomfort.  Has history of colon polyps and internal hemorrhoids seen on colonoscopies in the past.  Because he had persistent symptoms, he was a sent to me for evaluation.  Aside from his personal history of colon polyps, no personal nor family history of GI/colon cancer, inflammatory bowel disease, irritable bowel syndrome, allergy such as Celiac Sprue, dietary/dairy problems, colitis, ulcers nor gastritis.  No recent sick contacts/gastroenteritis.  No travel outside the country.  No changes in diet.    Patient Active Problem List  Diagnosis  . HYPERLIPIDEMIA  . ANXIETY  . DEPRESSION  . HEMORRHOIDS  . GERD  . DIVERTICULOSIS, COLON  . RENAL CALCULUS  . ABDOMINAL PAIN  . COLONIC POLYPS, HX OF  . Abdominal pain  . Organic anxiety syndrome  . Chronic idiopathic anal pain  . IBS (irritable bowel syndrome)  . Thrombosed external hemorrhoid    Past Medical History  Diagnosis Date  . ANXIETY   . Personal history of colonic polyps 10/27/2007    HYPERPLASTIC POLYP  . DEPRESSION   . DIVERTICULOSIS, COLON [phone]    Colonoscopy  . GERD   . HYPERLIPIDEMIA   .  RENAL CALCULUS   . IBS (irritable bowel syndrome)   . Hiatal hernia [id number]    EGD  . H. pylori infection   . Arthritis   . Anal fissure   . TMJ (temporomandibular joint syndrome)   . Unspecified gastritis and gastroduodenitis without mention of hemorrhage 2009    EGD  . Hemorrhoids [id number]    Colonoscopy   . Peripheral neuropathy     Past Surgical History  Procedure Laterality Date  . Knee surgery Left   . Shoulder surgery Bilateral 07/2005, 03/2011    left x 2  . Heel spur surgery Bilateral   . Hammer toe surgery Left   . Lithotripsy    . 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 Partial knee arthroplasty Right   . Foot neuroma surgery Right     History   Social History  . Marital Status: Single    Spouse Name: N/A    Number of Children: 0  . Years of Education: N/A   Occupational History  . retired [employer]    disabled now   Social History Main Topics  . Smoking status: Never Smoker   . Smokeless tobacco: Never Used  . Alcohol Use: No  . Drug Use: No  . Sexually Active: Not on file   Other Topics Concern  . Not on file   Social History Narrative  . No narrative on file    Family History  Problem Relation Age of Onset  . Heart disease Mother   .  Stroke Mother   . Cirrhosis Father   . Heart disease Father   . Skin cancer Father   . Colon cancer Neg Hx   . Prostate cancer Maternal Grandfather   . Parkinson's disease Paternal Grandfather   . Heart disease Brother     Current Outpatient Prescriptions  Medication Sig Dispense Refill  . desonide (DESOWEN) 0.05 % lotion Apply topically 2 (two) times daily.  118 mL  2  . Gabapentin POWD daily.      . Lidocaine-Hydrocortisone Ace 3-2.5 % KIT Place 5 mLs rectally 2 (two) times daily.  30 each  1  . LORazepam (ATIVAN) 1 MG tablet Take 1 mg by mouth every 8 (eight) hours.       . Omega-3 Fatty Acids (FISH OIL PO) Take 1 tablet by mouth daily.       No current facility-administered medications for  this visit.     Allergies  Allergen Reactions  . Ivp Dye (Iodinated Diagnostic Agents)   . Soma (Carisoprodol)     Sores on arm  . Sulfamethoxazole     REACTION: unspecified    BP 112/70  Pulse 72  Temp(Src) 97.8 F (36.6 C) (Oral)  Resp 14  Ht 6' (1.829 m)  Wt 190 lb (86.183 kg)  BMI 25.76 kg/m2  No results found.   Review of Systems  Constitutional: Negative for fever, chills and diaphoresis.  HENT: Negative for nosebleeds, sore throat, facial swelling, mouth sores, trouble swallowing and ear discharge.   Eyes: Negative for photophobia, discharge and visual disturbance.  Respiratory: Negative for choking, chest tightness, shortness of breath and stridor.   Cardiovascular: Negative for chest pain and palpitations.  Gastrointestinal: Positive for constipation and rectal pain. Negative for nausea, vomiting, abdominal pain, diarrhea, blood in stool, abdominal distention and anal bleeding.  Genitourinary: Negative for dysuria, urgency, difficulty urinating and testicular pain.  Musculoskeletal: Negative for myalgias, back pain, arthralgias and gait problem.  Skin: Negative for color change, pallor, rash and wound.  Neurological: Negative for dizziness, speech difficulty, weakness, numbness and headaches.  Hematological: Negative for adenopathy. Does not bruise/bleed easily.  Psychiatric/Behavioral: Negative for hallucinations, confusion and agitation.       Objective:   Physical Exam  Constitutional: He is oriented to person, place, and time. He appears well-developed and well-nourished. No distress.  HENT:  Head: Normocephalic.  Mouth/Throat: Oropharynx is clear and moist. No oropharyngeal exudate.  Eyes: Conjunctivae and EOM are normal. Pupils are equal, round, and reactive to light. No scleral icterus.  Neck: Normal range of motion. Neck supple. No tracheal deviation present.  Cardiovascular: Normal rate, regular rhythm and intact distal pulses.   Pulmonary/Chest:  Effort normal and breath sounds normal. No respiratory distress.  Abdominal: Soft. He exhibits no distension. There is no tenderness. Hernia confirmed negative in the right inguinal area and confirmed negative in the left inguinal area.  Genitourinary:  Exam done with assistance of male Medical Assistant in the room.  Perianal skin clean with good hygiene.  No pruritis.  Obvious right anterior thrombosed hemorrhoid 1.5 x 1 cm in size.  Sensitive.  No external skin tags / hemorrhoids of significance.  No pilonidal disease.  No fissure.  No abscess/fistula.    Tolerates digital and anoscopic rectal exam.  Normal sphincter tone.  No rectal masses.  Hemorrhoidal piles mildly enlarged   Musculoskeletal: Normal range of motion. He exhibits no tenderness.  Lymphadenopathy:    He has no cervical adenopathy.       Right:  No inguinal adenopathy present.       Left: No inguinal adenopathy present.  Neurological: He is alert and oriented to person, place, and time. No cranial nerve deficit. He exhibits normal muscle tone. Coordination normal.  Skin: Skin is warm and dry. No rash noted. He is not diaphoretic. No erythema. No pallor.  Psychiatric: He has a normal mood and affect. His behavior is normal. Judgment and thought content normal.       Assessment:     Right anterior thrombosed hemorrhoid.  Persistent symptoms.  Chronic constipation     Plan:     Incision and drainage of thrombosed hemorrhoid:  The anatomy & physiology of the anorectal region was discussed.  The pathophysiology of hemorrhoids and differential diagnosis was discussed.  Natural history progression  of worsening swelling with eventual resolution over weeks to months was discussed.   I stressed the importance of a bowel regimen to have daily soft bowel movements to minimize progression of disease.     The patient's symptoms are not adequately controlled.  Therefore, I recommended incision to drain the thrombosed hemorrhoid..   I went over the technique, risks, benefits, and alternatives.   Questions were answered.  The patient expressed understanding & wished to proceed.  The patient was positioned in the lateral decubitus position.  I placed a field block of local anaesthetic.  Perianal & rectal examination was done.  I incised & decompressed the thrombosed hemorrhoid.  The patient tolerated the procedure well.    Educational handouts further explaining the pathology, treatment options, and bowel regimen were given as well.

## 2012-06-22 NOTE — Patient Instructions (Addendum)
ANORECTAL SURGERY: POST OP INSTRUCTIONS  1. Take your usually prescribed home medications unless otherwise directed. 2. DIET: Follow a light bland diet the first 24 hours after arrival home, such as soup, liquids, crackers, etc.  Be sure to include lots of fluids daily.  Avoid fast food or heavy meals as your are more likely to get nauseated.  Eat a low fat the next few days after surgery.   3. PAIN CONTROL: a. Pain is best controlled by a usual combination of three different methods TOGETHER: i. Ice/Heat ii. Over the counter pain medication iii. Prescription pain medication b. Most patients will experience some swelling and discomfort in the anus/rectal area. and incisions.  Ice packs or heat (30-60 minutes up to 6 times a day) will help. Use ice for the first few days to help decrease swelling and bruising, then switch to heat such as warm towels, sitz baths, warm baths, etc to help relax tight/sore spots and speed recovery.  Some people prefer to use ice alone, heat alone, alternating between ice & heat.  Experiment to what works for you.  Swelling and bruising can take several weeks to resolve.   c. It is helpful to take an over-the-counter pain medication regularly for the first few weeks.  Choose one of the following that works best for you: i. Naproxen (Aleve, etc)  Two 220mg tabs twice a day ii. Ibuprofen (Advil, etc) Three 200mg tabs four times a day (every meal & bedtime) iii. Acetaminophen (Tylenol, etc) 500-650mg four times a day (every meal & bedtime) d. A  prescription for pain medication (such as oxycodone, hydrocodone, etc) should be given to you upon discharge.  Take your pain medication as prescribed.  i. If you are having problems/concerns with the prescription medicine (does not control pain, nausea, vomiting, rash, itching, etc), please call us [phone] to see if we need to switch you to a different pain medicine that will work better for you and/or control your side effect  better. ii. If you need a refill on your pain medication, please contact your pharmacy.  They will contact our office to request authorization. Prescriptions will not be filled after 5 pm or on week-ends. 4. KEEP YOUR BOWELS REGULAR a. The goal is one bowel movement a day b. Avoid getting constipated.  Between the surgery and the pain medications, it is common to experience some constipation.  Increasing fluid intake and taking a fiber supplement (such as Metamucil, Citrucel, FiberCon, MiraLax, etc) 1-2 times a day regularly will usually help prevent this problem from occurring.  A mild laxative (prune juice, Milk of Magnesia, MiraLax, etc) should be taken according to package directions if there are no bowel movements after 48 hours. c. Watch out for diarrhea.  If you have many loose bowel movements, simplify your diet to bland foods & liquids for a few days.  Stop any stool softeners and decrease your fiber supplement.  Switching to mild anti-diarrheal medications (Kayopectate, Pepto Bismol) can help.  If this worsens or does not improve, please call us.  5. Wound Care a. Remove your bandages the day after surgery.  Unless discharge instructions indicate otherwise, leave your bandage dry and in place overnight.  Remove the bandage during your first bowel movement.   b. Allow the wound packing to fall out over the next few days.  You can trim exposed gauze / ribbon as it falls out.  You do not need to repack the wound unless instructed otherwise.  Wear an   absorbent pad or soft cotton gauze in your underwear as needed to catch any drainage and help keep the area  c. Keep the area clean and dry.  Bathe / shower every day.  Keep the area clean by showering / bathing over the incision / wound.   It is okay to soak an open wound to help wash it.  Wet wipes or showers / gentle washing after bowel movements is often less traumatic than regular toilet paper. d. You may have some styrofoam-like soft packing in  the rectum which will come out with the first bowel movement.  e. You will often notice bleeding with bowel movements.  This should slow down by the end of the first week of surgery f. Expect some drainage.  This should slow down, too, by the end of the first week of surgery.  Wear an absorbent pad or soft cotton gauze in your underwear until the drainage stops. 6. ACTIVITIES as tolerated:   a. You may resume regular (light) daily activities beginning the next day-such as daily self-care, walking, climbing stairs-gradually increasing activities as tolerated.  If you can walk 30 minutes without difficulty, it is safe to try more intense activity such as jogging, treadmill, bicycling, low-impact aerobics, swimming, etc. b. Save the most intensive and strenuous activity for last such as sit-ups, heavy lifting, contact sports, etc  Refrain from any heavy lifting or straining until you are off narcotics for pain control.   c. DO NOT PUSH THROUGH PAIN.  Let pain be your guide: If it hurts to do something, don't do it.  Pain is your body warning you to avoid that activity for another week until the pain goes down. d. You may drive when you are no longer taking prescription pain medication, you can comfortably sit for long periods of time, and you can safely maneuver your car and apply brakes. e. You may have sexual intercourse when it is comfortable.  7. FOLLOW UP in our office a. Please call [facility] at [phone] to set up an appointment to see your surgeon in the office for a follow-up appointment approximately 2 weeks after your surgery. b. Make sure that you call for this appointment the day you arrive home to insure a convenient appointment time. 10. IF YOU HAVE DISABILITY OR FAMILY LEAVE FORMS, BRING THEM TO THE OFFICE FOR PROCESSING.  DO NOT GIVE THEM TO YOUR DOCTOR.        WHEN TO CALL US [phone]: 1. Poor pain control 2. Reactions / problems with new medications (rash/itching, nausea,  etc)  3. Fever over 101.5 F (38.5 C) 4. Inability to urinate 5. Nausea and/or vomiting 6. Worsening swelling or bruising 7. Continued bleeding from incision. 8. Increased pain, redness, or drainage from the incision  The clinic staff is available to answer your questions during regular business hours (8:30am-5pm).  Please don't hesitate to call and ask to speak to one of our nurses for clinical concerns.   A surgeon from [facility] is always on call at the hospitals   If you have a medical emergency, go to the nearest emergency room or call 911.    [facility] [address], [address], [place], [place]  [address] ? MAIN: [phone] ? TOLL FREE: [phone] ? FAX [phone] [email or web address]   HEMORRHOIDS  The rectum is the last foot of your colon, and it naturally stretches to hold stool.  Hemorrhoidal piles are natural clusters of blood vessels   that help the rectum and anal canal stretch to hold stool and allow bowel movements to eliminate feces.   Hemorrhoids are abnormally swollen blood vessels in the rectum.  Too much pressure in the rectum causes hemorrhoids by forcing blood to stretch and bulge the walls of the veins, sometimes even rupturing them.  Hemorrhoids can become like varicose veins you might see on a person's legs.  Most people will develop a flare of hemorrhoids in their lifetime.  When bulging hemorrhoidal veins are irritated, they can swell, burn, itch, cause pain, and bleed.  Most flares will calm down gradually own within a few weeks.  However, once hemorrhoids are created, they are difficult to get rid of completely and tend to flare more easily than the first flare.   Fortunately, good habits and simple medical treatment usually control hemorrhoids well, and surgery is needed only in severe cases. Types of Hemorrhoids:  Internal hemorrhoids usually don't initially hurt or itch; they are deep inside the rectum  and usually have no sensation. If they begin to push out (prolapse), pain and burning can occur.  However, internal hemorrhoids can bleed.  Anal bleeding should not be ignored since bleeding could come from a dangerous source like colorectal cancer, so persistent rectal bleeding should be investigated by a doctor, sometimes with a colonoscopy.  External hemorrhoids cause most of the symptoms - pain, burning, and itching. Nonirritated hemorrhoids can look like small skin tags coming out of the anus.   Thrombosed hemorrhoids can form when a hemorrhoid blood vessel bursts and causes the hemorrhoid to suddenly swell.  A purple blood clot can form in it and become an excruciatingly painful lump at the anus. Because of these unpleasant symptoms, immediate incision and drainage by a surgeon at an office visit can provide much relief of the pain.    PREVENTION Avoiding the most frequent causes listed below will prevent most cases of hemorrhoids: Constipation Hard stools Diarrhea  Constant sitting  Straining with bowel movements Sitting on the toilet for a long time  Severe coughing  episodes Pregnancy / Childbirth  Heavy Lifting  Sometimes avoiding the above triggers is difficult:  How can you avoid sitting all day if you have a seated job? Also, we try to avoid coughing and diarrhea, but sometimes it's beyond your control.  Still, there are some practical hints to help: Keep the anal and genital area clean.  Moistened tissues such as flushable wet wipes are less irritating than toilet paper.  Using irrigating showers or bottle irrigation washing gently cleans this sensitive area.   Avoid dry toilet paper when cleaning after bowel movements.  . Keep the anal and genital area dry.  Lightly pat the rectal area dry.  Avoid rubbing.  Talcum or baby powders can help GET YOUR STOOLS SOFT.   This is the most important way to prevent irritated hemorrhoids.  Hard stools are like sandpaper to the anorectal canal and  will cause more problems.  The goal: ONE SOFT BOWEL MOVEMENT A DAY!  BMs from every other day to 3 times a day is a tolerable range Treat coughing, diarrhea and constipation early since irritated hemorrhoids may soon follow.  If your main job activity is seated, always stand or walk during your breaks. Make it a point to stand and walk at least 5 minutes every hour and try to shift frequently in your chair to avoid direct rectal pressure.  Always exhale as you strain or lift. Don't hold your breath.    Do not delay or try to prevent a bowel movement when the urge is present. Exercise regularly (walking or jogging 60 minutes a day) to stimulate the bowels to move. No reading or other activity while on the toilet. If bowel movements take longer than 5 minutes, you are too constipated. AVOID CONSTIPATION Drink plenty of liquids (1 1/2 to 2 quarts of water and other fluids a day unless fluid restricted for another medical condition). Liquids that contain caffeine (coffee a, tea, soft drinks) can be dehydrating and should be avoided until constipation is controlled. Consider minimizing milk, as dairy products may be constipating. Eat plenty of fiber (30g a day ideal, more if needed).  Fiber is the undigested part of plant food that passes into the colon, acting as "natures broom" to encourage bowel motility and movement.  Fiber can absorb and hold large amounts of water. This results in a larger, bulkier stool, which is soft and easier to pass.  Eating foods high in fiber - 12 servings - such as  Vegetables: Root (potatoes, carrots, turnips), Leafy green (lettuce, salad greens, celery, spinach), High residue (cabbage, broccoli, etc.) Fruit: Fresh, Dried (prunes, apricots, cherries), Stewed (applesauce)  Whole grain breads, pasta, whole wheat Bran cereals, muffins, etc. Consider adding supplemental bulking fiber which retains large volumes of water: Psyllium ground seeds --available as Metamucil, Konsyl,  Effersyllium, Per Diem Fiber, or the less expensive generic forms.  Citrucel  (methylcellulose wood fiber) . FiberCon (Polycarbophil) Polyethylene Glycol - and "artificial" fiber commonly called Miralax or Glycolax.  It is helpful for people with gassy or bloated feelings with regular fiber Flax Seed - a less gassy natural fiber  Laxatives can be useful for a short period if constipation is severe Osmotics (Milk of Magnesia, Fleets Phospho-Soda, Magnesium Citrate)  Stimulants (Senokot,   Castor Oil,  Dulcolax, Ex-Lax)    Laxatives are not a good long-term solution as it can stress the bowels and cause too much mineral loss and dehydration.   Avoid taking laxatives for more than 7 days in a row.  AVOID DIARRHEA Switch to liquids and simpler foods for a few days to avoid stressing your intestines further. Avoid dairy products (especially milk & ice cream) for a short time.  The intestines often can lose the ability to digest lactose when stressed. Avoid foods that cause gassiness or bloating.  Typical foods include beans and other legumes, cabbage, broccoli, and dairy foods.  Every person has some sensitivity to other foods, so listen to your body and avoid those foods that trigger problems for you. Adding fiber (Citrucel, Metamucil, FiberCon, Flax seed, Miralax) gradually can help thicken stools by absorbing excess fluid and retrain the intestines to act more normally.  Slowly increase the dose over a few weeks.  Too much fiber too soon can backfire and cause cramping & bloating. Probiotics (such as active yogurt, Align, etc) may help repopulate the intestines and colon with normal bacteria and calm down a sensitive digestive tract.  Most studies show it to be of mild help, though, and such products can be costly. Medicines: Bismuth subsalicylate (ex. Kayopectate, Pepto Bismol) every 30 minutes for up to 6 doses can help control diarrhea.  Avoid if pregnant. Loperamide (Immodium) can slow down  diarrhea.  Start with two tablets (4mg total) first and then try one tablet every 6 hours.  Avoid if you are having fevers or severe pain.  If you are not better or start feeling worse, stop all medicines and call your doctor   for advice Call your doctor if you are getting worse or not better.  Sometimes further testing (cultures, endoscopy, X-ray studies, bloodwork, etc) may be needed to help diagnose and treat the cause of the diarrhea. TREATMENT OF HEMORRHOID FLARE If these preventive measures fail, you must take action right away! Hemorrhoids are one condition that can be mild in the morning and become intolerable by nightfall. Most hemorrhoidal flares take several weeks to calm down.  These suggestions can help: Warm soaks.  This helps more than any topical medication.  Use up to 8 times a day.  Usually sitz baths or sitting in a warm bathtub helps.  Sitting on moist warm towels are helpful.  Switching to ice packs/cool compresses can be helpful Normalize your bowels.  Extremes of diarrhea or constipation will make hemorrhoids worse.  One soft bowel movement a day is the goal.  Fiber can help get your bowels regular Wet wipes instead of toilet paper Pain control with a NSAID such as ibuprofen (Advil) or naproxen (Aleve) or acetaminophen (Tylenol) around the clock.  Narcotics are constipating and should be minimized if possible Topical creams contain steroids (bydrocortisone) or local anesthetic (xylocaine) can help make pain and itching more tolerable.   EVALUATION If hemorrhoids are still causing problems, you could benefit by an evaluation by a surgeon.  The surgeon will obtain a history and examine you.  If hemorrhoids are diagnosed, some therapies can be offered in the office, usually with an anoscope into the less sensitive area of the rectum: -injection of hemorrhoids (sclerotherapy) can scar the blood vessels of the swollen/enlarged hemorrhoids to help shrink them down to a more normal  size -rubber banding of the enlarged hemorrhoids to help shrink them down to a more normal size -drainage of the blood clot causing a thrombosed hemorrhoid,  to relieve the severe pain   While 90% of the time such problems from hemorrhoids can be managed without preceding to surgery, sometimes the hemorrhoids require a operation to control the problem (uncontrolled bleeding, prolapse, pain, etc.).   This involves being placed under general anesthesia where the surgeon can confirm the diagnosis and remove, suture, or staple the hemorrhoid(s).  Your surgeon can help you treat the problem appropriately.    

## 2012-06-23 ENCOUNTER — Ambulatory Visit: Payer: Medicare Other | Admitting: Gastroenterology

## 2012-07-01 ENCOUNTER — Ambulatory Visit (INDEPENDENT_AMBULATORY_CARE_PROVIDER_SITE_OTHER): Payer: Medicare Other | Admitting: Surgery

## 2012-07-01 VITALS — BP 122/70 | HR 80 | Temp 98.4°F | Resp 18 | Ht 72.0 in | Wt 189.0 lb

## 2012-07-01 DIAGNOSIS — K589 Irritable bowel syndrome without diarrhea: Secondary | ICD-10-CM

## 2012-07-01 DIAGNOSIS — K645 Perianal venous thrombosis: Secondary | ICD-10-CM

## 2012-07-01 NOTE — Progress Notes (Signed)
Subjective:     Patient ID: David Armstrong, male   DOB: 02/15/1951, 62 y.o.   MRN: [id number]  HPI  David Armstrong  04-Jan-1951 [id number]  Patient Care Team: Lindley Magnus, MD as PCP - General Ardeth Sportsman, MD as Consulting Physician (General Surgery) Mardella Layman, MD as Consulting Physician (Gastroenterology) Meredith Pel, NP as Nurse Practitioner (Gastroenterology)  This patient is a 62 y.o.male who presents today for surgical evaluation Status post incision and drainage of thrombosed hemorrhoid 06/22/2012  The patient comes in today feeling okay.  Bleeding down.  Soreness down.  Still sensitive.  Taking Citrucel every day.  No fevers chills or sweats.  Patient Active Problem List  Diagnosis  . HYPERLIPIDEMIA  . ANXIETY  . DEPRESSION  . HEMORRHOIDS  . GERD  . DIVERTICULOSIS, COLON  . RENAL CALCULUS  . ABDOMINAL PAIN  . COLONIC POLYPS, HX OF  . Abdominal pain  . Organic anxiety syndrome  . Chronic idiopathic anal pain  . IBS (irritable bowel syndrome)  . Thrombosed external hemorrhoid s/p I&D [id number]    Past Medical History  Diagnosis Date  . ANXIETY   . Personal history of colonic polyps 10/27/2007    HYPERPLASTIC POLYP  . DEPRESSION   . DIVERTICULOSIS, COLON [phone]    Colonoscopy  . GERD   . HYPERLIPIDEMIA   . RENAL CALCULUS   . IBS (irritable bowel syndrome)   . Hiatal hernia [phone]    EGD  . H. pylori infection   . Arthritis   . Anal fissure   . TMJ (temporomandibular joint syndrome)   . Unspecified gastritis and gastroduodenitis without mention of hemorrhage 2009    EGD  . Hemorrhoids [id number]    Colonoscopy   . Peripheral neuropathy     Past Surgical History  Procedure Laterality Date  . Knee surgery Left   . Shoulder surgery Bilateral 07/2005, 03/2011    left x 2  . Heel spur surgery Bilateral   . Hammer toe surgery Left   . Lithotripsy    . 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 Partial knee arthroplasty Right   .  Foot neuroma surgery Right     History   Social History  . Marital Status: Single    Spouse Name: N/A    Number of Children: 0  . Years of Education: N/A   Occupational History  . retired [employer]    disabled now   Social History Main Topics  . Smoking status: Never Smoker   . Smokeless tobacco: Never Used  . Alcohol Use: No  . Drug Use: No  . Sexually Active: Not on file   Other Topics Concern  . Not on file   Social History Narrative  . No narrative on file    Family History  Problem Relation Age of Onset  . Heart disease Mother   . Stroke Mother   . Cirrhosis Father   . Heart disease Father   . Skin cancer Father   . Colon cancer Neg Hx   . Prostate cancer Maternal Grandfather   . Parkinson's disease Paternal Grandfather   . Heart disease Brother     Current Outpatient Prescriptions  Medication Sig Dispense Refill  . desonide (DESOWEN) 0.05 % lotion Apply topically 2 (two) times daily.  118 mL  2  . Gabapentin POWD daily.      . Lidocaine-Hydrocortisone Ace 3-2.5 % KIT Place 5 mLs rectally 2 (two) times daily.  30 each  1  . LORazepam (ATIVAN) 1 MG tablet Take 1 mg by mouth every 8 (eight) hours.       . Omega-3 Fatty Acids (FISH OIL PO) Take 1 tablet by mouth daily.       No current facility-administered medications for this visit.     Allergies  Allergen Reactions  . Ivp Dye (Iodinated Diagnostic Agents)   . Soma (Carisoprodol)     Sores on arm  . Sulfamethoxazole     REACTION: unspecified    BP 122/70  Pulse 80  Temp(Src) 98.4 F (36.9 C)  Resp 18  Ht 6' (1.829 m)  Wt 189 lb (85.73 kg)  BMI 25.63 kg/m2  No results found.   Review of Systems  Constitutional: Negative for fever, chills and diaphoresis.  HENT: Negative for sore throat, trouble swallowing and neck pain.   Eyes: Negative for photophobia and visual disturbance.  Respiratory: Negative for choking and shortness of breath.   Cardiovascular: Negative for chest pain and  palpitations.  Gastrointestinal: Positive for anal bleeding and rectal pain. Negative for nausea, vomiting and abdominal distention.  Genitourinary: Negative for dysuria, urgency, difficulty urinating and testicular pain.  Musculoskeletal: Negative for myalgias, arthralgias and gait problem.  Skin: Negative for color change and rash.  Neurological: Negative for dizziness, speech difficulty, weakness and numbness.  Hematological: Negative for adenopathy.  Psychiatric/Behavioral: Negative for hallucinations, confusion and agitation.       Objective:   Physical Exam  Constitutional: He is oriented to person, place, and time. He appears well-developed and well-nourished. No distress.  HENT:  Head: Normocephalic.  Mouth/Throat: Oropharynx is clear and moist. No oropharyngeal exudate.  Eyes: Conjunctivae and EOM are normal. Pupils are equal, round, and reactive to light. No scleral icterus.  Neck: Normal range of motion. No tracheal deviation present.  Cardiovascular: Normal rate, normal heart sounds and intact distal pulses.   Pulmonary/Chest: Effort normal. No respiratory distress.  Abdominal: Soft. He exhibits no distension. There is no tenderness. Hernia confirmed negative in the right inguinal area and confirmed negative in the left inguinal area.  Incisions clean with normal healing ridges.  No hernias  Genitourinary:  Exam done with assistance of male Medical Assistant in the room.  Perianal skin clean with good hygiene.  No pruritis.  No external skin tags / hemorrhoids of significance.  No pilonidal disease.  No fissure.  No abscess/fistula.  Normal sphincter tone.    Small anal canal linear wound.  Open but granulating.  Sensitive.  No cellulitis.  No evidence of thrombosed hemorrhoid recurrence   Musculoskeletal: Normal range of motion. He exhibits no tenderness.  Neurological: He is alert and oriented to person, place, and time. No cranial nerve deficit. He exhibits normal muscle  tone. Coordination normal.  Skin: Skin is warm and dry. No rash noted. He is not diaphoretic.  Psychiatric: He has a normal mood and affect. His behavior is normal.       Assessment:     Thrombosed hemorrhoid nine days status post incision and drainage.  Sensitive wound but healing.    Plan:     Increase activity as tolerated to regular activity.  Do not push through pain.Warm soaks and anti-inflammatories are usually helpful for this.  Diet as tolerated. Bowel regimen to avoid problems.  Continuous daily Citrucel.  He feels reassured.  Return to clinic p.r.n.   Instructions discussed.  Followup with primary care physician for other health issues as would normally be done.  Questions answered.  The  patient expressed understanding and appreciation

## 2012-07-01 NOTE — Patient Instructions (Addendum)
HEMORRHOIDS  The rectum is the last foot of your colon, and it naturally stretches to hold stool.  Hemorrhoidal piles are natural clusters of blood vessels that help the rectum and anal canal stretch to hold stool and allow bowel movements to eliminate feces.   Hemorrhoids are abnormally swollen blood vessels in the rectum.  Too much pressure in the rectum causes hemorrhoids by forcing blood to stretch and bulge the walls of the veins, sometimes even rupturing them.  Hemorrhoids can become like varicose veins you might see on a person's legs.  Most people will develop a flare of hemorrhoids in their lifetime.  When bulging hemorrhoidal veins are irritated, they can swell, burn, itch, cause pain, and bleed.  Most flares will calm down gradually own within a few weeks.  However, once hemorrhoids are created, they are difficult to get rid of completely and tend to flare more easily than the first flare.   Fortunately, good habits and simple medical treatment usually control hemorrhoids well, and surgery is needed only in severe cases. Types of Hemorrhoids:  Internal hemorrhoids usually don't initially hurt or itch; they are deep inside the rectum and usually have no sensation. If they begin to push out (prolapse), pain and burning can occur.  However, internal hemorrhoids can bleed.  Anal bleeding should not be ignored since bleeding could come from a dangerous source like colorectal cancer, so persistent rectal bleeding should be investigated by a doctor, sometimes with a colonoscopy.  External hemorrhoids cause most of the symptoms - pain, burning, and itching. Nonirritated hemorrhoids can look like small skin tags coming out of the anus.   Thrombosed hemorrhoids can form when a hemorrhoid blood vessel bursts and causes the hemorrhoid to suddenly swell.  A purple blood clot can form in it and become an excruciatingly painful lump at the anus. Because of these unpleasant symptoms, immediate incision and  drainage by a surgeon at an office visit can provide much relief of the pain.    PREVENTION Avoiding the most frequent causes listed below will prevent most cases of hemorrhoids: Constipation Hard stools Diarrhea  Constant sitting  Straining with bowel movements Sitting on the toilet for a long time  Severe coughing  episodes Pregnancy / Childbirth  Heavy Lifting  Sometimes avoiding the above triggers is difficult:  How can you avoid sitting all day if you have a seated job? Also, we try to avoid coughing and diarrhea, but sometimes it's beyond your control.  Still, there are some practical hints to help: Keep the anal and genital area clean.  Moistened tissues such as flushable wet wipes are less irritating than toilet paper.  Using irrigating showers or bottle irrigation washing gently cleans this sensitive area.   Avoid dry toilet paper when cleaning after bowel movements.  . Keep the anal and genital area dry.  Lightly pat the rectal area dry.  Avoid rubbing.  Talcum or baby powders can help GET YOUR STOOLS SOFT.   This is the most important way to prevent irritated hemorrhoids.  Hard stools are like sandpaper to the anorectal canal and will cause more problems.  The goal: ONE SOFT BOWEL MOVEMENT A DAY!  BMs from every other day to 3 times a day is a tolerable range Treat coughing, diarrhea and constipation early since irritated hemorrhoids may soon follow.  If your main job activity is seated, always stand or walk during your breaks. Make it a point to stand and walk at least 5 minutes every hour   and try to shift frequently in your chair to avoid direct rectal pressure.  Always exhale as you strain or lift. Don't hold your breath.  Do not delay or try to prevent a bowel movement when the urge is present. Exercise regularly (walking or jogging 60 minutes a day) to stimulate the bowels to move. No reading or other activity while on the toilet. If bowel movements take longer than 5 minutes,  you are too constipated. AVOID CONSTIPATION Drink plenty of liquids (1 1/2 to 2 quarts of water and other fluids a day unless fluid restricted for another medical condition). Liquids that contain caffeine (coffee a, tea, soft drinks) can be dehydrating and should be avoided until constipation is controlled. Consider minimizing milk, as dairy products may be constipating. Eat plenty of fiber (30g a day ideal, more if needed).  Fiber is the undigested part of plant food that passes into the colon, acting as "natures broom" to encourage bowel motility and movement.  Fiber can absorb and hold large amounts of water. This results in a larger, bulkier stool, which is soft and easier to pass.  Eating foods high in fiber - 12 servings - such as  Vegetables: Root (potatoes, carrots, turnips), Leafy green (lettuce, salad greens, celery, spinach), High residue (cabbage, broccoli, etc.) Fruit: Fresh, Dried (prunes, apricots, cherries), Stewed (applesauce)  Whole grain breads, pasta, whole wheat Bran cereals, muffins, etc. Consider adding supplemental bulking fiber which retains large volumes of water: Psyllium ground seeds --available as Metamucil, Konsyl, Effersyllium, Per Diem Fiber, or the less expensive generic forms.  Citrucel  (methylcellulose wood fiber) . FiberCon (Polycarbophil) Polyethylene Glycol - and "artificial" fiber commonly called Miralax or Glycolax.  It is helpful for people with gassy or bloated feelings with regular fiber Flax Seed - a less gassy natural fiber  Laxatives can be useful for a short period if constipation is severe Osmotics (Milk of Magnesia, Fleets Phospho-Soda, Magnesium Citrate)  Stimulants (Senokot,   Castor Oil,  Dulcolax, Ex-Lax)    Laxatives are not a good long-term solution as it can stress the bowels and cause too much mineral loss and dehydration.   Avoid taking laxatives for more than 7 days in a row.  AVOID DIARRHEA Switch to liquids and simpler foods for a few  days to avoid stressing your intestines further. Avoid dairy products (especially milk & ice cream) for a short time.  The intestines often can lose the ability to digest lactose when stressed. Avoid foods that cause gassiness or bloating.  Typical foods include beans and other legumes, cabbage, broccoli, and dairy foods.  Every person has some sensitivity to other foods, so listen to your body and avoid those foods that trigger problems for you. Adding fiber (Citrucel, Metamucil, FiberCon, Flax seed, Miralax) gradually can help thicken stools by absorbing excess fluid and retrain the intestines to act more normally.  Slowly increase the dose over a few weeks.  Too much fiber too soon can backfire and cause cramping & bloating. Probiotics (such as active yogurt, Align, etc) may help repopulate the intestines and colon with normal bacteria and calm down a sensitive digestive tract.  Most studies show it to be of mild help, though, and such products can be costly. Medicines: Bismuth subsalicylate (ex. Kayopectate, Pepto Bismol) every 30 minutes for up to 6 doses can help control diarrhea.  Avoid if pregnant. Loperamide (Immodium) can slow down diarrhea.  Start with two tablets (4mg total) first and then try one tablet every 6 hours.    Avoid if you are having fevers or severe pain.  If you are not better or start feeling worse, stop all medicines and call your doctor for advice Call your doctor if you are getting worse or not better.  Sometimes further testing (cultures, endoscopy, X-ray studies, bloodwork, etc) may be needed to help diagnose and treat the cause of the diarrhea. TREATMENT OF HEMORRHOID FLARE If these preventive measures fail, you must take action right away! Hemorrhoids are one condition that can be mild in the morning and become intolerable by nightfall. Most hemorrhoidal flares take several weeks to calm down.  These suggestions can help: Warm soaks.  This helps more than any topical  medication.  Use up to 8 times a day.  Usually sitz baths or sitting in a warm bathtub helps.  Sitting on moist warm towels are helpful.  Switching to ice packs/cool compresses can be helpful Normalize your bowels.  Extremes of diarrhea or constipation will make hemorrhoids worse.  One soft bowel movement a day is the goal.  Fiber can help get your bowels regular Wet wipes instead of toilet paper Pain control with a NSAID such as ibuprofen (Advil) or naproxen (Aleve) or acetaminophen (Tylenol) around the clock.  Narcotics are constipating and should be minimized if possible Topical creams contain steroids (bydrocortisone) or local anesthetic (xylocaine) can help make pain and itching more tolerable.   EVALUATION If hemorrhoids are still causing problems, you could benefit by an evaluation by a surgeon.  The surgeon will obtain a history and examine you.  If hemorrhoids are diagnosed, some therapies can be offered in the office, usually with an anoscope into the less sensitive area of the rectum: -injection of hemorrhoids (sclerotherapy) can scar the blood vessels of the swollen/enlarged hemorrhoids to help shrink them down to a more normal size -rubber banding of the enlarged hemorrhoids to help shrink them down to a more normal size -drainage of the blood clot causing a thrombosed hemorrhoid,  to relieve the severe pain   While 90% of the time such problems from hemorrhoids can be managed without preceding to surgery, sometimes the hemorrhoids require a operation to control the problem (uncontrolled bleeding, prolapse, pain, etc.).   This involves being placed under general anesthesia where the surgeon can confirm the diagnosis and remove, suture, or staple the hemorrhoid(s).  Your surgeon can help you treat the problem appropriately.    ANORECTAL SURGERY: POST OP INSTRUCTIONS  1. Take your usually prescribed home medications unless otherwise directed. 2. DIET: Follow a light bland diet the first 24  hours after arrival home, such as soup, liquids, crackers, etc.  Be sure to include lots of fluids daily.  Avoid fast food or heavy meals as your are more likely to get nauseated.  Eat a low fat the next few days after surgery.   3. PAIN CONTROL: a. Pain is best controlled by a usual combination of three different methods TOGETHER: i. Ice/Heat ii. Over the counter pain medication iii. Prescription pain medication b. Most patients will experience some swelling and discomfort in the anus/rectal area. and incisions.  Ice packs or heat (30-60 minutes up to 6 times a day) will help. Use ice for the first few days to help decrease swelling and bruising, then switch to heat such as warm towels, sitz baths, warm baths, etc to help relax tight/sore spots and speed recovery.  Some people prefer to use ice alone, heat alone, alternating between ice & heat.  Experiment to what works for   you.  Swelling and bruising can take several weeks to resolve.   c. It is helpful to take an over-the-counter pain medication regularly for the first few weeks.  Choose one of the following that works best for you: i. Naproxen (Aleve, etc)  Two 220mg tabs twice a day ii. Ibuprofen (Advil, etc) Three 200mg tabs four times a day (every meal & bedtime) iii. Acetaminophen (Tylenol, etc) 500-650mg four times a day (every meal & bedtime) d. A  prescription for pain medication (such as oxycodone, hydrocodone, etc) should be given to you upon discharge.  Take your pain medication as prescribed.  i. If you are having problems/concerns with the prescription medicine (does not control pain, nausea, vomiting, rash, itching, etc), please call us [phone] to see if we need to switch you to a different pain medicine that will work better for you and/or control your side effect better. ii. If you need a refill on your pain medication, please contact your pharmacy.  They will contact our office to request authorization. Prescriptions will not  be filled after 5 pm or on week-ends. 4. KEEP YOUR BOWELS REGULAR a. The goal is one bowel movement a day b. Avoid getting constipated.  Between the surgery and the pain medications, it is common to experience some constipation.  Increasing fluid intake and taking a fiber supplement (such as Metamucil, Citrucel, FiberCon, MiraLax, etc) 1-2 times a day regularly will usually help prevent this problem from occurring.  A mild laxative (prune juice, Milk of Magnesia, MiraLax, etc) should be taken according to package directions if there are no bowel movements after 48 hours. c. Watch out for diarrhea.  If you have many loose bowel movements, simplify your diet to bland foods & liquids for a few days.  Stop any stool softeners and decrease your fiber supplement.  Switching to mild anti-diarrheal medications (Kayopectate, Pepto Bismol) can help.  If this worsens or does not improve, please call us.  5. Wound Care a. Remove your bandages the day after surgery.  Unless discharge instructions indicate otherwise, leave your bandage dry and in place overnight.  Remove the bandage during your first bowel movement.   b. Allow the wound packing to fall out over the next few days.  You can trim exposed gauze / ribbon as it falls out.  You do not need to repack the wound unless instructed otherwise.  Wear an absorbent pad or soft cotton gauze in your underwear as needed to catch any drainage and help keep the area  c. Keep the area clean and dry.  Bathe / shower every day.  Keep the area clean by showering / bathing over the incision / wound.   It is okay to soak an open wound to help wash it.  Wet wipes or showers / gentle washing after bowel movements is often less traumatic than regular toilet paper. d. You may have some styrofoam-like soft packing in the rectum which will come out with the first bowel movement.  e. You will often notice bleeding with bowel movements.  This should slow down by the end of the first  week of surgery f. Expect some drainage.  This should slow down, too, by the end of the first week of surgery.  Wear an absorbent pad or soft cotton gauze in your underwear until the drainage stops. 6. ACTIVITIES as tolerated:   a. You may resume regular (light) daily activities beginning the next day-such as daily self-care, walking, climbing stairs-gradually increasing activities   as tolerated.  If you can walk 30 minutes without difficulty, it is safe to try more intense activity such as jogging, treadmill, bicycling, low-impact aerobics, swimming, etc. b. Save the most intensive and strenuous activity for last such as sit-ups, heavy lifting, contact sports, etc  Refrain from any heavy lifting or straining until you are off narcotics for pain control.   c. DO NOT PUSH THROUGH PAIN.  Let pain be your guide: If it hurts to do something, don't do it.  Pain is your body warning you to avoid that activity for another week until the pain goes down. d. You may drive when you are no longer taking prescription pain medication, you can comfortably sit for long periods of time, and you can safely maneuver your car and apply brakes. e. You may have sexual intercourse when it is comfortable.  7. FOLLOW UP in our office a. Please call [facility] at [phone] to set up an appointment to see your surgeon in the office for a follow-up appointment approximately 2 weeks after your surgery. b. Make sure that you call for this appointment the day you arrive home to insure a convenient appointment time. 10. IF YOU HAVE DISABILITY OR FAMILY LEAVE FORMS, BRING THEM TO THE OFFICE FOR PROCESSING.  DO NOT GIVE THEM TO YOUR DOCTOR.        WHEN TO CALL US [phone]: 1. Poor pain control 2. Reactions / problems with new medications (rash/itching, nausea, etc)  3. Fever over 101.5 F (38.5 C) 4. Inability to urinate 5. Nausea and/or vomiting 6. Worsening swelling or bruising 7. Continued bleeding from  incision. 8. Increased pain, redness, or drainage from the incision  The clinic staff is available to answer your questions during regular business hours (8:30am-5pm).  Please don't hesitate to call and ask to speak to one of our nurses for clinical concerns.   A surgeon from [facility] is always on call at the hospitals   If you have a medical emergency, go to the nearest emergency room or call 911.    [facility] [address], [address], , [place]  [address] ? MAIN: [phone] ? TOLL FREE: [phone] ? FAX [phone] [email or web address]    

## 2012-07-07 ENCOUNTER — Telehealth: Payer: Self-pay | Admitting: Gastroenterology

## 2012-07-07 MED ORDER — DEXLANSOPRAZOLE 60 MG PO CPDR: 60.0000 mg | DELAYED_RELEASE_CAPSULE | Freq: Every day | ORAL | Status: DC

## 2012-07-07 NOTE — Telephone Encounter (Signed)
Medication sent and patient notified  

## 2012-07-29 DIAGNOSIS — G609 Hereditary and idiopathic neuropathy, unspecified: Secondary | ICD-10-CM | POA: Diagnosis not present

## 2012-07-29 DIAGNOSIS — IMO0002 Reserved for concepts with insufficient information to code with codable children: Secondary | ICD-10-CM | POA: Diagnosis not present

## 2012-07-29 DIAGNOSIS — G4700 Insomnia, unspecified: Secondary | ICD-10-CM | POA: Diagnosis not present

## 2012-07-29 DIAGNOSIS — G5610 Other lesions of median nerve, unspecified upper limb: Secondary | ICD-10-CM | POA: Diagnosis not present

## 2012-07-29 DIAGNOSIS — M5412 Radiculopathy, cervical region: Secondary | ICD-10-CM | POA: Diagnosis not present

## 2012-08-05 DIAGNOSIS — M25579 Pain in unspecified ankle and joints of unspecified foot: Secondary | ICD-10-CM | POA: Diagnosis not present

## 2012-08-05 DIAGNOSIS — M5137 Other intervertebral disc degeneration, lumbosacral region: Secondary | ICD-10-CM | POA: Diagnosis not present

## 2012-08-05 DIAGNOSIS — M722 Plantar fascial fibromatosis: Secondary | ICD-10-CM | POA: Diagnosis not present

## 2012-08-05 DIAGNOSIS — M999 Biomechanical lesion, unspecified: Secondary | ICD-10-CM | POA: Diagnosis not present

## 2012-08-05 DIAGNOSIS — Z713 Dietary counseling and surveillance: Secondary | ICD-10-CM | POA: Diagnosis not present

## 2012-08-05 DIAGNOSIS — M9981 Other biomechanical lesions of cervical region: Secondary | ICD-10-CM | POA: Diagnosis not present

## 2012-08-09 DIAGNOSIS — Z713 Dietary counseling and surveillance: Secondary | ICD-10-CM | POA: Diagnosis not present

## 2012-08-09 DIAGNOSIS — M999 Biomechanical lesion, unspecified: Secondary | ICD-10-CM | POA: Diagnosis not present

## 2012-08-09 DIAGNOSIS — M9981 Other biomechanical lesions of cervical region: Secondary | ICD-10-CM | POA: Diagnosis not present

## 2012-08-09 DIAGNOSIS — M25579 Pain in unspecified ankle and joints of unspecified foot: Secondary | ICD-10-CM | POA: Diagnosis not present

## 2012-08-09 DIAGNOSIS — M722 Plantar fascial fibromatosis: Secondary | ICD-10-CM | POA: Diagnosis not present

## 2012-08-09 DIAGNOSIS — M5137 Other intervertebral disc degeneration, lumbosacral region: Secondary | ICD-10-CM | POA: Diagnosis not present

## 2012-08-11 DIAGNOSIS — M9981 Other biomechanical lesions of cervical region: Secondary | ICD-10-CM | POA: Diagnosis not present

## 2012-08-11 DIAGNOSIS — Z713 Dietary counseling and surveillance: Secondary | ICD-10-CM | POA: Diagnosis not present

## 2012-08-11 DIAGNOSIS — M999 Biomechanical lesion, unspecified: Secondary | ICD-10-CM | POA: Diagnosis not present

## 2012-08-11 DIAGNOSIS — M722 Plantar fascial fibromatosis: Secondary | ICD-10-CM | POA: Diagnosis not present

## 2012-08-11 DIAGNOSIS — M5137 Other intervertebral disc degeneration, lumbosacral region: Secondary | ICD-10-CM | POA: Diagnosis not present

## 2012-08-11 DIAGNOSIS — M25579 Pain in unspecified ankle and joints of unspecified foot: Secondary | ICD-10-CM | POA: Diagnosis not present

## 2012-08-12 DIAGNOSIS — M5137 Other intervertebral disc degeneration, lumbosacral region: Secondary | ICD-10-CM | POA: Diagnosis not present

## 2012-08-12 DIAGNOSIS — M25579 Pain in unspecified ankle and joints of unspecified foot: Secondary | ICD-10-CM | POA: Diagnosis not present

## 2012-08-12 DIAGNOSIS — M999 Biomechanical lesion, unspecified: Secondary | ICD-10-CM | POA: Diagnosis not present

## 2012-08-12 DIAGNOSIS — Z713 Dietary counseling and surveillance: Secondary | ICD-10-CM | POA: Diagnosis not present

## 2012-08-12 DIAGNOSIS — M9981 Other biomechanical lesions of cervical region: Secondary | ICD-10-CM | POA: Diagnosis not present

## 2012-08-12 DIAGNOSIS — M722 Plantar fascial fibromatosis: Secondary | ICD-10-CM | POA: Diagnosis not present

## 2012-08-16 DIAGNOSIS — M9981 Other biomechanical lesions of cervical region: Secondary | ICD-10-CM | POA: Diagnosis not present

## 2012-08-16 DIAGNOSIS — M722 Plantar fascial fibromatosis: Secondary | ICD-10-CM | POA: Diagnosis not present

## 2012-08-16 DIAGNOSIS — Z713 Dietary counseling and surveillance: Secondary | ICD-10-CM | POA: Diagnosis not present

## 2012-08-16 DIAGNOSIS — M999 Biomechanical lesion, unspecified: Secondary | ICD-10-CM | POA: Diagnosis not present

## 2012-08-16 DIAGNOSIS — M25579 Pain in unspecified ankle and joints of unspecified foot: Secondary | ICD-10-CM | POA: Diagnosis not present

## 2012-08-16 DIAGNOSIS — M5137 Other intervertebral disc degeneration, lumbosacral region: Secondary | ICD-10-CM | POA: Diagnosis not present

## 2012-08-19 DIAGNOSIS — M25579 Pain in unspecified ankle and joints of unspecified foot: Secondary | ICD-10-CM | POA: Diagnosis not present

## 2012-08-19 DIAGNOSIS — M999 Biomechanical lesion, unspecified: Secondary | ICD-10-CM | POA: Diagnosis not present

## 2012-08-19 DIAGNOSIS — M722 Plantar fascial fibromatosis: Secondary | ICD-10-CM | POA: Diagnosis not present

## 2012-08-19 DIAGNOSIS — M5137 Other intervertebral disc degeneration, lumbosacral region: Secondary | ICD-10-CM | POA: Diagnosis not present

## 2012-08-19 DIAGNOSIS — M9981 Other biomechanical lesions of cervical region: Secondary | ICD-10-CM | POA: Diagnosis not present

## 2012-08-23 DIAGNOSIS — M5137 Other intervertebral disc degeneration, lumbosacral region: Secondary | ICD-10-CM | POA: Diagnosis not present

## 2012-08-23 DIAGNOSIS — M9981 Other biomechanical lesions of cervical region: Secondary | ICD-10-CM | POA: Diagnosis not present

## 2012-08-23 DIAGNOSIS — M722 Plantar fascial fibromatosis: Secondary | ICD-10-CM | POA: Diagnosis not present

## 2012-08-23 DIAGNOSIS — M25579 Pain in unspecified ankle and joints of unspecified foot: Secondary | ICD-10-CM | POA: Diagnosis not present

## 2012-08-23 DIAGNOSIS — M999 Biomechanical lesion, unspecified: Secondary | ICD-10-CM | POA: Diagnosis not present

## 2012-08-25 DIAGNOSIS — M999 Biomechanical lesion, unspecified: Secondary | ICD-10-CM | POA: Diagnosis not present

## 2012-08-25 DIAGNOSIS — M5137 Other intervertebral disc degeneration, lumbosacral region: Secondary | ICD-10-CM | POA: Diagnosis not present

## 2012-08-25 DIAGNOSIS — M25579 Pain in unspecified ankle and joints of unspecified foot: Secondary | ICD-10-CM | POA: Diagnosis not present

## 2012-08-25 DIAGNOSIS — M9981 Other biomechanical lesions of cervical region: Secondary | ICD-10-CM | POA: Diagnosis not present

## 2012-08-25 DIAGNOSIS — M722 Plantar fascial fibromatosis: Secondary | ICD-10-CM | POA: Diagnosis not present

## 2012-08-26 ENCOUNTER — Encounter: Payer: Self-pay | Admitting: Internal Medicine

## 2012-08-26 ENCOUNTER — Ambulatory Visit (INDEPENDENT_AMBULATORY_CARE_PROVIDER_SITE_OTHER): Payer: Medicare Other | Admitting: Internal Medicine

## 2012-08-26 VITALS — BP 110/70 | HR 76 | Temp 97.6°F | Resp 20 | Wt 195.0 lb

## 2012-08-26 DIAGNOSIS — F329 Major depressive disorder, single episode, unspecified: Secondary | ICD-10-CM

## 2012-08-26 DIAGNOSIS — R197 Diarrhea, unspecified: Secondary | ICD-10-CM | POA: Diagnosis not present

## 2012-08-26 DIAGNOSIS — F411 Generalized anxiety disorder: Secondary | ICD-10-CM

## 2012-08-26 MED ORDER — VENLAFAXINE HCL ER 150 MG PO CP24: 150.0000 mg | ORAL_CAPSULE | Freq: Every day | ORAL | Status: DC

## 2012-08-26 MED ORDER — VENLAFAXINE HCL 37.5 MG PO TABS: 37.5000 mg | ORAL_TABLET | Freq: Two times a day (BID) | ORAL | Status: DC

## 2012-08-26 NOTE — Progress Notes (Signed)
Subjective:    Patient ID: David Armstrong, male    DOB: [birth year], 62 y.o.   MRN: [id number]  HPI  62 year old patient who presents today with a chief complaint of weakness and diarrhea.  The diarrhea actually occurred 4 days ago and now has resolved. He complains of persistent weakness but states it is really been this way for about 6 months. He does have a history of depression and he feels this has recurred. He has seen Dr. Evelene Croon on a biannual basis. He has been on antidepressant therapy in the past and he feels he did best on Effexor. He wishes to resume this medication.  Past Medical History  Diagnosis Date  . ANXIETY   . Personal history of colonic polyps 10/27/2007    HYPERPLASTIC POLYP  . DEPRESSION   . DIVERTICULOSIS, COLON [phone]    Colonoscopy  . GERD   . HYPERLIPIDEMIA   . RENAL CALCULUS   . IBS (irritable bowel syndrome)   . Hiatal hernia [phone]    EGD  . H. pylori infection   . Arthritis   . Anal fissure   . TMJ (temporomandibular joint syndrome)   . Unspecified gastritis and gastroduodenitis without mention of hemorrhage 2009    EGD  . Hemorrhoids [id number]    Colonoscopy   . Peripheral neuropathy     History   Social History  . Marital Status: Single    Spouse Name: N/A    Number of Children: 0  . Years of Education: N/A   Occupational History  . retired [employer]    disabled now   Social History Main Topics  . Smoking status: Never Smoker   . Smokeless tobacco: Never Used  . Alcohol Use: No  . Drug Use: No  . Sexually Active: Not on file   Other Topics Concern  . Not on file   Social History Narrative  . No narrative on file    Past Surgical History  Procedure Laterality Date  . Knee surgery Left   . Shoulder surgery Bilateral 07/2005, 03/2011    left x 2  . Heel spur surgery Bilateral   . Hammer toe surgery Left   . Lithotripsy    . 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 Partial knee arthroplasty Right   . Foot  neuroma surgery Right     Family History  Problem Relation Age of Onset  . Heart disease Mother   . Stroke Mother   . Cirrhosis Father   . Heart disease Father   . Skin cancer Father   . Colon cancer Neg Hx   . Prostate cancer Maternal Grandfather   . Parkinson's disease Paternal Grandfather   . Heart disease Brother     Allergies  Allergen Reactions  . Ivp Dye (Iodinated Diagnostic Agents)   . Soma (Carisoprodol)     Sores on arm  . Sulfamethoxazole     REACTION: unspecified    Current Outpatient Prescriptions on File Prior to Visit  Medication Sig Dispense Refill  . desonide (DESOWEN) 0.05 % lotion Apply topically 2 (two) times daily.  118 mL  2  . dexlansoprazole (DEXILANT) 60 MG capsule Take 1 capsule (60 mg total) by mouth daily.  30 capsule  3  . LORazepam (ATIVAN) 1 MG tablet Take 1 mg by mouth every 8 (eight) hours.       . Omega-3 Fatty Acids (FISH OIL PO) Take 1 tablet by mouth daily.       No current  facility-administered medications on file prior to visit.    BP 110/70  Pulse 76  Temp(Src) 97.6 F (36.4 C) (Oral)  Resp 20  Wt 195 lb (88.451 kg)  BMI 26.44 kg/m2  SpO2 97%       Review of Systems  Constitutional: Positive for fatigue. Negative for fever, chills and appetite change.  HENT: Negative for hearing loss, ear pain, congestion, sore throat, trouble swallowing, neck stiffness, dental problem, voice change and tinnitus.   Eyes: Negative for pain, discharge and visual disturbance.  Respiratory: Negative for cough, chest tightness, wheezing and stridor.   Cardiovascular: Negative for chest pain, palpitations and leg swelling.  Gastrointestinal: Negative for nausea, vomiting, abdominal pain, diarrhea, constipation, blood in stool and abdominal distention.  Genitourinary: Negative for urgency, hematuria, flank pain, discharge, difficulty urinating and genital sores.  Musculoskeletal: Negative for myalgias, back pain, joint swelling, arthralgias  and gait problem.  Skin: Negative for rash.  Neurological: Positive for weakness. Negative for dizziness, syncope, speech difficulty, numbness and headaches.  Hematological: Negative for adenopathy. Does not bruise/bleed easily.  Psychiatric/Behavioral: Positive for dysphoric mood. Negative for behavioral problems. The patient is nervous/anxious.        Objective:   Physical Exam  Constitutional: He is oriented to person, place, and time. He appears well-developed.  HENT:  Head: Normocephalic.  Right Ear: External ear normal.  Left Ear: External ear normal.  Eyes: Conjunctivae and EOM are normal.  Neck: Normal range of motion.  Cardiovascular: Normal rate and normal heart sounds.   Pulmonary/Chest: Breath sounds normal.  Abdominal: Bowel sounds are normal.  Musculoskeletal: Normal range of motion. He exhibits no edema and no tenderness.  Neurological: He is alert and oriented to person, place, and time.  Psychiatric: He has a normal mood and affect. His behavior is normal.          Assessment & Plan:   Diarrhea. Resolved Depression. Options discussed including prompt followup with Dr. Jeraldine Loots. Patient wishes to resume the Effexor and also followup with psychiatry.  His maintenance dose in the past has been 150 mg daily.  We'll resume therapy Anxiety disorder. Continue lorazepam  Schedule CPX

## 2012-08-26 NOTE — Patient Instructions (Signed)
Followup with Dr. Evelene Croon    It is important that you exercise regularly, at least 20 minutes 3 to 4 times per week.  If you develop chest pain or shortness of breath seek  medical attention.  Call or return to clinic prn if these symptoms worsen or fail to improve as anticipated.

## 2012-08-30 DIAGNOSIS — M722 Plantar fascial fibromatosis: Secondary | ICD-10-CM | POA: Diagnosis not present

## 2012-08-30 DIAGNOSIS — M5137 Other intervertebral disc degeneration, lumbosacral region: Secondary | ICD-10-CM | POA: Diagnosis not present

## 2012-08-30 DIAGNOSIS — M9981 Other biomechanical lesions of cervical region: Secondary | ICD-10-CM | POA: Diagnosis not present

## 2012-08-30 DIAGNOSIS — M999 Biomechanical lesion, unspecified: Secondary | ICD-10-CM | POA: Diagnosis not present

## 2012-08-30 DIAGNOSIS — M25579 Pain in unspecified ankle and joints of unspecified foot: Secondary | ICD-10-CM | POA: Diagnosis not present

## 2012-09-01 DIAGNOSIS — M25579 Pain in unspecified ankle and joints of unspecified foot: Secondary | ICD-10-CM | POA: Diagnosis not present

## 2012-09-01 DIAGNOSIS — M9981 Other biomechanical lesions of cervical region: Secondary | ICD-10-CM | POA: Diagnosis not present

## 2012-09-01 DIAGNOSIS — M999 Biomechanical lesion, unspecified: Secondary | ICD-10-CM | POA: Diagnosis not present

## 2012-09-01 DIAGNOSIS — M5137 Other intervertebral disc degeneration, lumbosacral region: Secondary | ICD-10-CM | POA: Diagnosis not present

## 2012-09-01 DIAGNOSIS — M722 Plantar fascial fibromatosis: Secondary | ICD-10-CM | POA: Diagnosis not present

## 2012-09-02 DIAGNOSIS — M999 Biomechanical lesion, unspecified: Secondary | ICD-10-CM | POA: Diagnosis not present

## 2012-09-02 DIAGNOSIS — M722 Plantar fascial fibromatosis: Secondary | ICD-10-CM | POA: Diagnosis not present

## 2012-09-02 DIAGNOSIS — M25579 Pain in unspecified ankle and joints of unspecified foot: Secondary | ICD-10-CM | POA: Diagnosis not present

## 2012-09-02 DIAGNOSIS — M5137 Other intervertebral disc degeneration, lumbosacral region: Secondary | ICD-10-CM | POA: Diagnosis not present

## 2012-09-02 DIAGNOSIS — M9981 Other biomechanical lesions of cervical region: Secondary | ICD-10-CM | POA: Diagnosis not present

## 2012-09-07 DIAGNOSIS — M9981 Other biomechanical lesions of cervical region: Secondary | ICD-10-CM | POA: Diagnosis not present

## 2012-09-07 DIAGNOSIS — M5137 Other intervertebral disc degeneration, lumbosacral region: Secondary | ICD-10-CM | POA: Diagnosis not present

## 2012-09-07 DIAGNOSIS — M722 Plantar fascial fibromatosis: Secondary | ICD-10-CM | POA: Diagnosis not present

## 2012-09-07 DIAGNOSIS — M999 Biomechanical lesion, unspecified: Secondary | ICD-10-CM | POA: Diagnosis not present

## 2012-09-07 DIAGNOSIS — M25579 Pain in unspecified ankle and joints of unspecified foot: Secondary | ICD-10-CM | POA: Diagnosis not present

## 2012-09-08 DIAGNOSIS — M722 Plantar fascial fibromatosis: Secondary | ICD-10-CM | POA: Diagnosis not present

## 2012-09-08 DIAGNOSIS — M9981 Other biomechanical lesions of cervical region: Secondary | ICD-10-CM | POA: Diagnosis not present

## 2012-09-08 DIAGNOSIS — M5137 Other intervertebral disc degeneration, lumbosacral region: Secondary | ICD-10-CM | POA: Diagnosis not present

## 2012-09-08 DIAGNOSIS — M999 Biomechanical lesion, unspecified: Secondary | ICD-10-CM | POA: Diagnosis not present

## 2012-09-08 DIAGNOSIS — M25579 Pain in unspecified ankle and joints of unspecified foot: Secondary | ICD-10-CM | POA: Diagnosis not present

## 2012-09-15 DIAGNOSIS — M9981 Other biomechanical lesions of cervical region: Secondary | ICD-10-CM | POA: Diagnosis not present

## 2012-09-15 DIAGNOSIS — M5137 Other intervertebral disc degeneration, lumbosacral region: Secondary | ICD-10-CM | POA: Diagnosis not present

## 2012-09-15 DIAGNOSIS — M722 Plantar fascial fibromatosis: Secondary | ICD-10-CM | POA: Diagnosis not present

## 2012-09-15 DIAGNOSIS — M25579 Pain in unspecified ankle and joints of unspecified foot: Secondary | ICD-10-CM | POA: Diagnosis not present

## 2012-09-15 DIAGNOSIS — M999 Biomechanical lesion, unspecified: Secondary | ICD-10-CM | POA: Diagnosis not present

## 2012-09-20 DIAGNOSIS — M5137 Other intervertebral disc degeneration, lumbosacral region: Secondary | ICD-10-CM | POA: Diagnosis not present

## 2012-09-20 DIAGNOSIS — M722 Plantar fascial fibromatosis: Secondary | ICD-10-CM | POA: Diagnosis not present

## 2012-09-20 DIAGNOSIS — M999 Biomechanical lesion, unspecified: Secondary | ICD-10-CM | POA: Diagnosis not present

## 2012-09-20 DIAGNOSIS — M25579 Pain in unspecified ankle and joints of unspecified foot: Secondary | ICD-10-CM | POA: Diagnosis not present

## 2012-09-20 DIAGNOSIS — M9981 Other biomechanical lesions of cervical region: Secondary | ICD-10-CM | POA: Diagnosis not present

## 2012-09-23 DIAGNOSIS — M722 Plantar fascial fibromatosis: Secondary | ICD-10-CM | POA: Diagnosis not present

## 2012-09-23 DIAGNOSIS — M25579 Pain in unspecified ankle and joints of unspecified foot: Secondary | ICD-10-CM | POA: Diagnosis not present

## 2012-09-23 DIAGNOSIS — M999 Biomechanical lesion, unspecified: Secondary | ICD-10-CM | POA: Diagnosis not present

## 2012-09-23 DIAGNOSIS — M5137 Other intervertebral disc degeneration, lumbosacral region: Secondary | ICD-10-CM | POA: Diagnosis not present

## 2012-09-23 DIAGNOSIS — M9981 Other biomechanical lesions of cervical region: Secondary | ICD-10-CM | POA: Diagnosis not present

## 2012-09-27 DIAGNOSIS — M9981 Other biomechanical lesions of cervical region: Secondary | ICD-10-CM | POA: Diagnosis not present

## 2012-09-27 DIAGNOSIS — M999 Biomechanical lesion, unspecified: Secondary | ICD-10-CM | POA: Diagnosis not present

## 2012-09-27 DIAGNOSIS — M722 Plantar fascial fibromatosis: Secondary | ICD-10-CM | POA: Diagnosis not present

## 2012-09-27 DIAGNOSIS — M5137 Other intervertebral disc degeneration, lumbosacral region: Secondary | ICD-10-CM | POA: Diagnosis not present

## 2012-09-27 DIAGNOSIS — M25579 Pain in unspecified ankle and joints of unspecified foot: Secondary | ICD-10-CM | POA: Diagnosis not present

## 2012-09-28 DIAGNOSIS — D235 Other benign neoplasm of skin of trunk: Secondary | ICD-10-CM | POA: Diagnosis not present

## 2012-09-28 DIAGNOSIS — L570 Actinic keratosis: Secondary | ICD-10-CM | POA: Diagnosis not present

## 2012-09-30 DIAGNOSIS — M9981 Other biomechanical lesions of cervical region: Secondary | ICD-10-CM | POA: Diagnosis not present

## 2012-09-30 DIAGNOSIS — M25579 Pain in unspecified ankle and joints of unspecified foot: Secondary | ICD-10-CM | POA: Diagnosis not present

## 2012-09-30 DIAGNOSIS — M999 Biomechanical lesion, unspecified: Secondary | ICD-10-CM | POA: Diagnosis not present

## 2012-09-30 DIAGNOSIS — M722 Plantar fascial fibromatosis: Secondary | ICD-10-CM | POA: Diagnosis not present

## 2012-09-30 DIAGNOSIS — M5137 Other intervertebral disc degeneration, lumbosacral region: Secondary | ICD-10-CM | POA: Diagnosis not present

## 2012-10-04 DIAGNOSIS — M999 Biomechanical lesion, unspecified: Secondary | ICD-10-CM | POA: Diagnosis not present

## 2012-10-04 DIAGNOSIS — M9981 Other biomechanical lesions of cervical region: Secondary | ICD-10-CM | POA: Diagnosis not present

## 2012-10-04 DIAGNOSIS — M722 Plantar fascial fibromatosis: Secondary | ICD-10-CM | POA: Diagnosis not present

## 2012-10-04 DIAGNOSIS — M25579 Pain in unspecified ankle and joints of unspecified foot: Secondary | ICD-10-CM | POA: Diagnosis not present

## 2012-10-04 DIAGNOSIS — M5137 Other intervertebral disc degeneration, lumbosacral region: Secondary | ICD-10-CM | POA: Diagnosis not present

## 2012-10-07 DIAGNOSIS — M999 Biomechanical lesion, unspecified: Secondary | ICD-10-CM | POA: Diagnosis not present

## 2012-10-07 DIAGNOSIS — M722 Plantar fascial fibromatosis: Secondary | ICD-10-CM | POA: Diagnosis not present

## 2012-10-07 DIAGNOSIS — M9981 Other biomechanical lesions of cervical region: Secondary | ICD-10-CM | POA: Diagnosis not present

## 2012-10-07 DIAGNOSIS — M5137 Other intervertebral disc degeneration, lumbosacral region: Secondary | ICD-10-CM | POA: Diagnosis not present

## 2012-10-07 DIAGNOSIS — M25579 Pain in unspecified ankle and joints of unspecified foot: Secondary | ICD-10-CM | POA: Diagnosis not present

## 2012-10-11 DIAGNOSIS — M722 Plantar fascial fibromatosis: Secondary | ICD-10-CM | POA: Diagnosis not present

## 2012-10-11 DIAGNOSIS — M5137 Other intervertebral disc degeneration, lumbosacral region: Secondary | ICD-10-CM | POA: Diagnosis not present

## 2012-10-11 DIAGNOSIS — M999 Biomechanical lesion, unspecified: Secondary | ICD-10-CM | POA: Diagnosis not present

## 2012-10-11 DIAGNOSIS — M9981 Other biomechanical lesions of cervical region: Secondary | ICD-10-CM | POA: Diagnosis not present

## 2012-10-11 DIAGNOSIS — M25579 Pain in unspecified ankle and joints of unspecified foot: Secondary | ICD-10-CM | POA: Diagnosis not present

## 2012-10-13 DIAGNOSIS — M722 Plantar fascial fibromatosis: Secondary | ICD-10-CM | POA: Diagnosis not present

## 2012-10-13 DIAGNOSIS — M999 Biomechanical lesion, unspecified: Secondary | ICD-10-CM | POA: Diagnosis not present

## 2012-10-13 DIAGNOSIS — M5137 Other intervertebral disc degeneration, lumbosacral region: Secondary | ICD-10-CM | POA: Diagnosis not present

## 2012-10-13 DIAGNOSIS — M25579 Pain in unspecified ankle and joints of unspecified foot: Secondary | ICD-10-CM | POA: Diagnosis not present

## 2012-10-13 DIAGNOSIS — M9981 Other biomechanical lesions of cervical region: Secondary | ICD-10-CM | POA: Diagnosis not present

## 2012-10-14 DIAGNOSIS — M7700 Medial epicondylitis, unspecified elbow: Secondary | ICD-10-CM | POA: Diagnosis not present

## 2012-10-18 DIAGNOSIS — M25579 Pain in unspecified ankle and joints of unspecified foot: Secondary | ICD-10-CM | POA: Diagnosis not present

## 2012-10-18 DIAGNOSIS — M9981 Other biomechanical lesions of cervical region: Secondary | ICD-10-CM | POA: Diagnosis not present

## 2012-10-18 DIAGNOSIS — M722 Plantar fascial fibromatosis: Secondary | ICD-10-CM | POA: Diagnosis not present

## 2012-10-18 DIAGNOSIS — M999 Biomechanical lesion, unspecified: Secondary | ICD-10-CM | POA: Diagnosis not present

## 2012-10-18 DIAGNOSIS — M5137 Other intervertebral disc degeneration, lumbosacral region: Secondary | ICD-10-CM | POA: Diagnosis not present

## 2012-11-29 ENCOUNTER — Encounter: Payer: Self-pay | Admitting: Internal Medicine

## 2012-11-29 ENCOUNTER — Ambulatory Visit (INDEPENDENT_AMBULATORY_CARE_PROVIDER_SITE_OTHER): Payer: Medicare Other | Admitting: Internal Medicine

## 2012-11-29 VITALS — BP 106/74 | HR 84 | Temp 98.0°F | Ht 69.75 in | Wt 189.0 lb

## 2012-11-29 DIAGNOSIS — E785 Hyperlipidemia, unspecified: Secondary | ICD-10-CM

## 2012-11-29 DIAGNOSIS — R972 Elevated prostate specific antigen [PSA]: Secondary | ICD-10-CM

## 2012-11-29 DIAGNOSIS — Z0000 Encounter for general adult medical examination without abnormal findings: Secondary | ICD-10-CM | POA: Diagnosis not present

## 2012-11-29 DIAGNOSIS — R109 Unspecified abdominal pain: Secondary | ICD-10-CM | POA: Diagnosis not present

## 2012-11-29 DIAGNOSIS — Z23 Encounter for immunization: Secondary | ICD-10-CM

## 2012-11-29 DIAGNOSIS — Z2911 Encounter for prophylactic immunotherapy for respiratory syncytial virus (RSV): Secondary | ICD-10-CM

## 2012-11-29 LAB — HEPATIC FUNCTION PANEL
Albumin: 3.7 g/dL (ref 3.5–5.2)
Alkaline Phosphatase: 77 U/L (ref 39–117)
Bilirubin, Direct: 0.1 mg/dL (ref 0.0–0.3)
Total Protein: 6.4 g/dL (ref 6.0–8.3)

## 2012-11-29 LAB — BASIC METABOLIC PANEL
Calcium: 9 mg/dL (ref 8.4–10.5)
Creatinine, Ser: 0.8 mg/dL (ref 0.4–1.5)
GFR: 101.29 mL/min (ref >60.00)
Glucose, Bld: 84 mg/dL (ref 70–99)
Potassium: 4.4 mEq/L (ref 3.5–5.1)
Sodium: 139 mEq/L (ref 135–145)

## 2012-11-29 LAB — LIPID PANEL
Cholesterol: 211 mg/dL — ABNORMAL HIGH (ref 0–200)
HDL: 43.1 mg/dL (ref >39.00)
Total CHOL/HDL Ratio: 5
VLDL: 22.2 mg/dL (ref 0.0–40.0)

## 2012-11-29 LAB — CBC WITH DIFFERENTIAL/PLATELET
Basophils Relative: 0.4 % (ref 0.0–3.0)
Eosinophils Relative: 5 % (ref 0.0–5.0)
Lymphocytes Relative: 31.5 % (ref 12.0–46.0)
Neutrophils Relative %: 53.9 % (ref 43.0–77.0)
Platelets: 200 10*3/uL (ref 150.0–400.0)
RBC: 4.8 Mil/uL (ref 4.22–5.81)
WBC: 5.3 10*3/uL (ref 4.5–10.5)

## 2012-11-29 LAB — LDL CHOLESTEROL, DIRECT: Direct LDL: 150.7 mg/dL

## 2012-11-29 LAB — TSH: TSH: 3.67 u[IU]/mL (ref 0.35–5.50)

## 2012-11-29 NOTE — Progress Notes (Signed)
cpx  Past Medical History  Diagnosis Date  . ANXIETY   . Personal history of colonic polyps 10/27/2007    HYPERPLASTIC POLYP  . DEPRESSION   . DIVERTICULOSIS, COLON [phone]    Colonoscopy  . GERD   . HYPERLIPIDEMIA   . RENAL CALCULUS   . IBS (irritable bowel syndrome)   . Hiatal hernia [id number]    EGD  . H. pylori infection   . Arthritis   . Anal fissure   . TMJ (temporomandibular joint syndrome)   . Unspecified gastritis and gastroduodenitis without mention of hemorrhage 2009    EGD  . Hemorrhoids [id number]    Colonoscopy   . Peripheral neuropathy     History   Social History  . Marital Status: Single    Spouse Name: N/A    Number of Children: 0  . Years of Education: N/A   Occupational History  . retired [employer]    disabled now   Social History Main Topics  . Smoking status: Never Smoker   . Smokeless tobacco: Never Used  . Alcohol Use: No  . Drug Use: No  . Sexual Activity: Not on file   Other Topics Concern  . Not on file   Social History Narrative  . No narrative on file    Past Surgical History  Procedure Laterality Date  . Knee surgery Left   . Shoulder surgery Bilateral 07/2005, 03/2011    left x 2  . Heel spur surgery Bilateral   . Hammer toe surgery Left   . Lithotripsy    . 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 Partial knee arthroplasty Right   . Foot neuroma surgery Right     Family History  Problem Relation Age of Onset  . Heart disease Mother   . Stroke Mother   . Cirrhosis Father   . Heart disease Father   . Skin cancer Father   . Colon cancer Neg Hx   . Prostate cancer Maternal Grandfather   . Parkinson's disease Paternal Grandfather   . Heart disease Brother     Allergies  Allergen Reactions  . Dexilant [Dexlansoprazole] Other (See Comments)    Muscle aches  . Ivp Dye [Iodinated Diagnostic Agents]   . Soma [Carisoprodol]     Sores on arm  . Sulfamethoxazole     REACTION: unspecified    Current  Outpatient Prescriptions on File Prior to Visit  Medication Sig Dispense Refill  . LORazepam (ATIVAN) 1 MG tablet Take 1 mg by mouth every 8 (eight) hours.       . Omega-3 Fatty Acids (FISH OIL PO) Take 1 tablet by mouth daily.       No current facility-administered medications on file prior to visit.     patient denies chest pain, shortness of breath, orthopnea. Denies lower extremity edema, abdominal pain, change in appetite, change in bowel movements. Patient denies rashes, musculoskeletal complaints. No other specific complaints in a complete review of systems.   BP 106/74  Pulse 84  Temp(Src) 98 F (36.7 C) (Oral)  Ht 5' 9.75" (1.772 m)  Wt 189 lb (85.73 kg)  BMI 27.3 kg/m2   well-developed well-nourished male in no acute distress. HEENT exam atraumatic, normocephalic, neck supple without jugular venous distention. Chest clear to auscultation cardiac exam S1-S2 are regular. Abdominal exam overweight with bowel sounds, soft and nontender. Extremities no edema. Neurologic exam is alert with a normal gait.  Well visit- health maint UTD

## 2012-12-01 ENCOUNTER — Encounter: Payer: Medicare Other | Admitting: Internal Medicine

## 2012-12-02 ENCOUNTER — Telehealth: Payer: Self-pay | Admitting: Internal Medicine

## 2012-12-02 NOTE — Telephone Encounter (Signed)
Returned your call.

## 2012-12-02 NOTE — Telephone Encounter (Signed)
See result note.  

## 2012-12-07 DIAGNOSIS — L219 Seborrheic dermatitis, unspecified: Secondary | ICD-10-CM | POA: Diagnosis not present

## 2012-12-08 ENCOUNTER — Encounter: Payer: Medicare Other | Admitting: Internal Medicine

## 2012-12-22 DIAGNOSIS — Z23 Encounter for immunization: Secondary | ICD-10-CM | POA: Diagnosis not present

## 2013-02-17 ENCOUNTER — Emergency Department (HOSPITAL_COMMUNITY)
Admission: EM | Admit: 2013-02-17 | Discharge: 2013-02-17 | Disposition: A | Discharge status: Home / Self Care | Payer: Medicare Other | Attending: Emergency Medicine | Admitting: Emergency Medicine

## 2013-02-17 ENCOUNTER — Encounter (HOSPITAL_COMMUNITY): Payer: Self-pay | Admitting: Emergency Medicine

## 2013-02-17 DIAGNOSIS — Z79899 Other long term (current) drug therapy: Secondary | ICD-10-CM | POA: Insufficient documentation

## 2013-02-17 DIAGNOSIS — R209 Unspecified disturbances of skin sensation: Secondary | ICD-10-CM | POA: Diagnosis not present

## 2013-02-17 DIAGNOSIS — Z8719 Personal history of other diseases of the digestive system: Secondary | ICD-10-CM | POA: Insufficient documentation

## 2013-02-17 DIAGNOSIS — IMO0001 Reserved for inherently not codable concepts without codable children: Secondary | ICD-10-CM | POA: Diagnosis not present

## 2013-02-17 DIAGNOSIS — Z862 Personal history of diseases of the blood and blood-forming organs and certain disorders involving the immune mechanism: Secondary | ICD-10-CM | POA: Insufficient documentation

## 2013-02-17 DIAGNOSIS — Z8669 Personal history of other diseases of the nervous system and sense organs: Secondary | ICD-10-CM | POA: Insufficient documentation

## 2013-02-17 DIAGNOSIS — F411 Generalized anxiety disorder: Secondary | ICD-10-CM | POA: Insufficient documentation

## 2013-02-17 DIAGNOSIS — Z860100 Personal history of colon polyps, unspecified: Secondary | ICD-10-CM | POA: Insufficient documentation

## 2013-02-17 DIAGNOSIS — Z8619 Personal history of other infectious and parasitic diseases: Secondary | ICD-10-CM | POA: Diagnosis not present

## 2013-02-17 DIAGNOSIS — M549 Dorsalgia, unspecified: Secondary | ICD-10-CM | POA: Diagnosis not present

## 2013-02-17 DIAGNOSIS — Z8639 Personal history of other endocrine, nutritional and metabolic disease: Secondary | ICD-10-CM | POA: Insufficient documentation

## 2013-02-17 DIAGNOSIS — M542 Cervicalgia: Secondary | ICD-10-CM | POA: Insufficient documentation

## 2013-02-17 DIAGNOSIS — M791 Myalgia, unspecified site: Secondary | ICD-10-CM

## 2013-02-17 DIAGNOSIS — Z87442 Personal history of urinary calculi: Secondary | ICD-10-CM | POA: Insufficient documentation

## 2013-02-17 DIAGNOSIS — Z8739 Personal history of other diseases of the musculoskeletal system and connective tissue: Secondary | ICD-10-CM | POA: Diagnosis not present

## 2013-02-17 NOTE — ED Provider Notes (Signed)
[name]: [id number]     Arrival date & time 02/17/13  1753 History  This chart was scribed for non-physician practitioner Raymon Mutton, PA-C working with Flint Melter, MD by Danella Maiers, ED Scribe. This patient was seen in room WTR7/WTR7 and the patient's care was started at 6:14 PM.   Chief Complaint  Patient presents with  . Neck Pain   The history is provided by the patient. No language interpreter was used.   HPI Comments: David Armstrong is a 62 y.o. male who presents to the Emergency Department complaining of gradual-onset, non-radiating, right-sided neck pain that started one hour ago described as aching. He states he started wearing his retainer again today and thinks that is what is causing the neck pain. He has had prior episodes of similar neck pain but this one has lasted longer. Nothing makes the pain better or worse. The pain is rated as a 3-4/10. He has not tried any OTC medications. He denies any heavy lifting, quick motions, recent falls. He denies jaw pain, difficulty swallowing or chewing, CP, SOB, dyspnea, new numbness or tingling to the right side, facial droop, weakness to the right side, fevers, chills, cough, congestion. He has numbness in bilateral feet and back pain at baseline. He denies recent surgeries.  PCP - Dr. Birdie Sons  Past Medical History  Diagnosis Date  . ANXIETY   . Personal history of colonic polyps 10/27/2007    HYPERPLASTIC POLYP  . DEPRESSION   . DIVERTICULOSIS, COLON [phone]    Colonoscopy  . GERD   . HYPERLIPIDEMIA   . RENAL CALCULUS   . IBS (irritable bowel syndrome)   . Hiatal hernia [id number],4010    EGD  . H. pylori infection   . Arthritis   . Anal fissure   . TMJ (temporomandibular joint syndrome)   . Unspecified gastritis and gastroduodenitis without mention of hemorrhage 2009    EGD  . Hemorrhoids [id number]    Colonoscopy   . Peripheral neuropathy    Past Surgical History  Procedure Laterality Date  . Knee surgery  Left   . Shoulder surgery Bilateral 07/2005, 03/2011    left x 2  . Heel spur surgery Bilateral   . Hammer toe surgery Left   . Lithotripsy    . 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 Partial knee arthroplasty Right   . Foot neuroma surgery Right    Family History  Problem Relation Age of Onset  . Heart disease Mother   . Stroke Mother   . Cirrhosis Father   . Heart disease Father   . Skin cancer Father   . Colon cancer Neg Hx   . Prostate cancer Maternal Grandfather   . Parkinson's disease Paternal Grandfather   . Heart disease Brother    History  Substance Use Topics  . Smoking status: Never Smoker   . Smokeless tobacco: Never Used  . Alcohol Use: No    Review of Systems  Constitutional: Negative for fever and chills.  HENT: Negative for congestion.   Respiratory: Negative for cough and shortness of breath.   Cardiovascular: Negative for chest pain.  Musculoskeletal: Positive for back pain and neck pain.  Neurological: Positive for numbness. Negative for facial asymmetry and weakness.  All other systems reviewed and are negative.    Allergies  Dexilant; Soma; Sulfamethoxazole; and Ivp dye  Home Medications   Current Outpatient Rx  Name  Route  Sig  Dispense  Refill  . LORazepam (ATIVAN) 1 MG tablet  Oral   Take 1 mg by mouth every 8 (eight) hours.          . Omega-3 Fatty Acids (FISH OIL PO)   Oral   Take 1 tablet by mouth daily.          BP 100/77  Pulse 91  Temp(Src) 98.3 F (36.8 C) (Oral)  Resp 16  SpO2 100% Physical Exam  Nursing note and vitals reviewed. Constitutional: He is oriented to person, place, and time. He appears well-developed and well-nourished. No distress.  HENT:  Head: Normocephalic and atraumatic.  Mouth/Throat: Oropharynx is clear and moist. No oropharyngeal exudate.  Negative pain upon palpation to the jawline Negative trismus Negative findings of poor dentition - negative cavities noted, negative active decay,  negative diagrammed teeth noted  Uvula midline, symmetrical elevation. Negative signs of peritonsillar abscess.  Eyes: Conjunctivae and EOM are normal. Pupils are equal, round, and reactive to light. Right eye exhibits no discharge. Left eye exhibits no discharge.  Neck: Normal range of motion. Neck supple. No tracheal deviation present.    Negative neck stiffness Negative nuchal rigidity Negative c-cpine tenderness Negative meningeal signs  Discomfort upon palpation to the right side of the neck - muscular in nature - discomfort along right sternocleidomastoid muscle Negative masses palpated Negative deformities or asymmetry noted to the neck   Cardiovascular: Normal rate, regular rhythm and normal heart sounds.  Exam reveals no friction rub.   No murmur heard. Pulses:      Radial pulses are 2+ on the right side, and 2+ on the left side.  Pulmonary/Chest: Effort normal and breath sounds normal. No respiratory distress. He has no wheezes. He has no rales.  Musculoskeletal: Normal range of motion.  Full ROM to upper and lower extremities bilaterally without difficulty noted  Lymphadenopathy:    He has no cervical adenopathy.  Neurological: He is alert and oriented to person, place, and time. No cranial nerve deficit. He exhibits normal muscle tone. Coordination normal.  Skin: Skin is warm and dry. No rash noted. He is not diaphoretic. No erythema.  Psychiatric: He has a normal mood and affect. His behavior is normal. Thought content normal.    ED Course  Procedures (including critical care time) Medications - No data to display  DIAGNOSTIC STUDIES: Oxygen Saturation is 100% on RA, normal by my interpretation.    COORDINATION OF CARE: 6:56 PM- Discussed treatment plan with pt. Pt agrees to plan.    Labs Review Labs Reviewed - No data to display Imaging Review No results found.  EKG Interpretation   None       MDM   1. Neck pain   2. Muscular pain     Filed  Vitals:   02/17/13 1806  BP: 100/77  Pulse: 91  Temp: 98.3 F (36.8 C)  TempSrc: Oral  Resp: 16  SpO2: 100%    I personally performed the services described in this documentation, which was scribed in my presence. The recorded information has been reviewed and is accurate.  Patient presenting to emergency Department neck pain that started at approximately 45 minutes ago prior to arrival to the emergency department. Localize the right side described as a pulling sensation that worsens with motion. Alert and oriented. Heart rate and rhythm normal. Pulses palpable and strong, radial bilaterally. Full range of motion to upper lower extremities bilaterally. Lungs clear to auscultation bilaterally. Cranial nerves grossly intact. Negative trismus identified. Negative poor dentition, negative dental caries identified, negative diagrammed teeth. Cranial nerves  grossly intact. Discomfort upon palpation to the right aspect of the neck - muscular in nature. Neck with full range of motion and supple. Patient stable, afebrile. Suspicion to be musculoskeletal pain. Doubt peritonsillar abscess. Doubt hematoma. Doubt infection. Negative neurological deficits identified. 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 Discharged patient. Discussed with patient to rest and stay hydrated. Discussed with patient to apply warm compressions. Discussed with patient to eat soft foods and to decrease straining of jaw. Discussed with patient to followup primary care provider. Discussed with patient to closely monitor symptoms and if symptoms are to worsen or change report back to emergency department - strict return instructions given. Patient agreed to plan of care, understood, all questions answered.   Raymon Mutton, PA-C 02/19/13 1357

## 2013-02-17 NOTE — ED Notes (Signed)
Pt states having R sided neck pain that started this evening, states started wearing his retainer again today and unsure if that's what is causing his neck pain.

## 2013-02-19 NOTE — ED Provider Notes (Signed)
Medical screening examination/treatment/procedure(s) were performed by non-physician practitioner and as supervising physician I was immediately available for consultation/collaboration.  Flint Melter, MD 02/19/13 [phone]

## 2013-02-21 DIAGNOSIS — M722 Plantar fascial fibromatosis: Secondary | ICD-10-CM | POA: Diagnosis not present

## 2013-02-21 DIAGNOSIS — M779 Enthesopathy, unspecified: Secondary | ICD-10-CM | POA: Diagnosis not present

## 2013-02-21 DIAGNOSIS — G5760 Lesion of plantar nerve, unspecified lower limb: Secondary | ICD-10-CM | POA: Diagnosis not present

## 2013-02-28 DIAGNOSIS — M65979 Unspecified synovitis and tenosynovitis, unspecified ankle and foot: Secondary | ICD-10-CM | POA: Diagnosis not present

## 2013-02-28 DIAGNOSIS — M779 Enthesopathy, unspecified: Secondary | ICD-10-CM | POA: Diagnosis not present

## 2013-02-28 DIAGNOSIS — G5760 Lesion of plantar nerve, unspecified lower limb: Secondary | ICD-10-CM | POA: Diagnosis not present

## 2013-03-01 ENCOUNTER — Encounter: Payer: Self-pay | Admitting: Gastroenterology

## 2013-03-01 ENCOUNTER — Ambulatory Visit (INDEPENDENT_AMBULATORY_CARE_PROVIDER_SITE_OTHER): Payer: Medicare Other | Admitting: Gastroenterology

## 2013-03-01 VITALS — BP 120/68 | HR 88 | Ht 69.75 in | Wt 182.0 lb

## 2013-03-01 DIAGNOSIS — K625 Hemorrhage of anus and rectum: Secondary | ICD-10-CM

## 2013-03-01 DIAGNOSIS — K6289 Other specified diseases of anus and rectum: Secondary | ICD-10-CM | POA: Diagnosis not present

## 2013-03-01 DIAGNOSIS — K219 Gastro-esophageal reflux disease without esophagitis: Secondary | ICD-10-CM | POA: Diagnosis not present

## 2013-03-01 MED ORDER — NA SULFATE-K SULFATE-MG SULF 17.5-3.13-1.6 GM/177ML PO SOLN: ORAL | Status: DC

## 2013-03-01 NOTE — Patient Instructions (Signed)

## 2013-03-01 NOTE — Progress Notes (Signed)
History of Present Illness:  This is a chronic patient have seen for many years with IBS and acid reflux. Have not seen him in the last 2 years, but he saw one of our physician's assistants because of external hemorrhoid several months ago which required surgical attention by Dr. Karie Soda. He continues to complain of rectal pain and some rectal bleeding with mild constipation, gas and bloating. He also complains of vague abdominal pain, distention, acid reflux, regurgitation, and burning substernal chest pain. He apparently could not tolerate Dexilant, and is not on any type of acid suppressant at this time. He denies true dysphagia, specific hepatobiliary complaints, anorexia but has had a 10 pound weight loss. He suffers from chronic anxiety syndrome, but otherwise is fairly healthy. Last endoscopy in September of 2012 showed a large lateral hernia but was negative for H. pylori. Last colonoscopy was in 2009.   I have reviewed this patient's present history, medical and surgical past history, allergies and medications.     ROS:   All systems were reviewed and are negative unless otherwise stated in the HPI. Allergies  Allergen Reactions  . Dexilant [Dexlansoprazole] Other (See Comments)    Muscle aches  . Soma [Carisoprodol]     Sores on arm  . Sulfamethoxazole     REACTION: unspecified  . Ivp Dye [Iodinated Diagnostic Agents] Rash   Outpatient Prescriptions Prior to Visit  Medication Sig Dispense Refill  . LORazepam (ATIVAN) 1 MG tablet Take 1 mg by mouth every 8 (eight) hours.       . Omega-3 Fatty Acids (FISH OIL PO) Take 1 tablet by mouth daily.       No facility-administered medications prior to visit.   Past Medical History  Diagnosis Date  . ANXIETY   . Personal history of colonic polyps 10/27/2007    HYPERPLASTIC POLYP  . DEPRESSION   . DIVERTICULOSIS, COLON [phone]    Colonoscopy  . GERD   . HYPERLIPIDEMIA   . RENAL CALCULUS   . IBS (irritable bowel syndrome)    . Hiatal hernia [id number]    EGD  . H. pylori infection   . Arthritis   . Anal fissure   . TMJ (temporomandibular joint syndrome)   . Unspecified gastritis and gastroduodenitis without mention of hemorrhage 2009    EGD  . Hemorrhoids [id number]    Colonoscopy   . Peripheral neuropathy    Past Surgical History  Procedure Laterality Date  . Knee surgery Left   . Shoulder surgery Bilateral 07/2005, 03/2011    left x 2  . Heel spur surgery Bilateral   . Hammer toe surgery Left   . Lithotripsy    . 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 Partial knee arthroplasty Right   . Foot neuroma surgery Right    History   Social History  . Marital Status: Single    Spouse Name: N/A    Number of Children: 0  . Years of Education: N/A   Occupational History  . retired [employer]    disabled now   Social History Main Topics  . Smoking status: Never Smoker   . Smokeless tobacco: Never Used  . Alcohol Use: No  . Drug Use: No  . Sexual Activity: None   Other Topics Concern  . None   Social History Narrative  . None   Family History  Problem Relation Age of Onset  . Heart disease Mother   . Stroke Mother   . Cirrhosis Father   .  Heart disease Father   . Skin cancer Father   . Colon cancer Neg Hx   . Prostate cancer Maternal Grandfather   . Parkinson's disease Paternal Grandfather   . Heart disease Brother         Physical Exam: healthy-appearing patient in no distress. Blood pressure 120/68, pulse 88 and regular, and weight 182 with a BMI of 26.29.  General well developed well nourished patient in no acute distress, appearing their stated age Eyes PERRLA, no icterus, fundoscopic exam per opthamologist Skin no lesions noted Neck supple, no adenopathy, no thyroid enlargement, no tenderness Chest clear to percussion and auscultation Heart no significant murmurs, gallops or rubs noted Abdomen no hepatosplenomegaly masses or tenderness, BS normal.  Rectal inspection  normal no fissures, or fistulae noted.  No masses or tenderness on digital exam. Stool guaiac negative. I cannot appreciate any obvious external hemorrhoids, fissures or fistulae.  Extremities no acute joint lesions, edema, phlebitis or evidence of cellulitis. Neurologic patient oriented x 3, cranial nerves intact, no focal neurologic deficits noted. Psychological mental status normal and normal affect.  Assessment and plan:Refractory GERD which may require surgical intervention because of his inability to take PPI medications for whatever reason. However, he probably would develop gas bloat syndrome, and we need further GI motility studies before this is accomplished. The neural his endoscopy he may need gastric emptying scan and esophageal manometry. Because of his continued lower bowel problems we will repeat his colonoscopy which has not been done in several years. He has so-called" idiopathic anal pain syndrome". He also will probably need anorectal manometry with Janene Madeira.

## 2013-03-02 ENCOUNTER — Ambulatory Visit (AMBULATORY_SURGERY_CENTER): Payer: Medicare Other | Admitting: Gastroenterology

## 2013-03-02 ENCOUNTER — Other Ambulatory Visit: Payer: Self-pay | Admitting: *Deleted

## 2013-03-02 ENCOUNTER — Encounter: Payer: Self-pay | Admitting: Gastroenterology

## 2013-03-02 ENCOUNTER — Telehealth: Payer: Self-pay | Admitting: *Deleted

## 2013-03-02 VITALS — BP 118/72 | HR 59 | Temp 97.6°F | Resp 19 | Ht 71.0 in | Wt 175.0 lb

## 2013-03-02 DIAGNOSIS — R1314 Dysphagia, pharyngoesophageal phase: Secondary | ICD-10-CM | POA: Diagnosis not present

## 2013-03-02 DIAGNOSIS — F341 Dysthymic disorder: Secondary | ICD-10-CM | POA: Diagnosis not present

## 2013-03-02 DIAGNOSIS — K649 Unspecified hemorrhoids: Secondary | ICD-10-CM | POA: Diagnosis not present

## 2013-03-02 DIAGNOSIS — Z1211 Encounter for screening for malignant neoplasm of colon: Secondary | ICD-10-CM | POA: Diagnosis not present

## 2013-03-02 DIAGNOSIS — K219 Gastro-esophageal reflux disease without esophagitis: Secondary | ICD-10-CM

## 2013-03-02 DIAGNOSIS — Z860100 Personal history of colon polyps, unspecified: Secondary | ICD-10-CM

## 2013-03-02 DIAGNOSIS — R109 Unspecified abdominal pain: Secondary | ICD-10-CM | POA: Diagnosis not present

## 2013-03-02 DIAGNOSIS — K6289 Other specified diseases of anus and rectum: Secondary | ICD-10-CM

## 2013-03-02 DIAGNOSIS — K589 Irritable bowel syndrome without diarrhea: Secondary | ICD-10-CM | POA: Diagnosis not present

## 2013-03-02 DIAGNOSIS — E785 Hyperlipidemia, unspecified: Secondary | ICD-10-CM | POA: Diagnosis not present

## 2013-03-02 DIAGNOSIS — K5730 Diverticulosis of large intestine without perforation or abscess without bleeding: Secondary | ICD-10-CM | POA: Diagnosis not present

## 2013-03-02 MED ORDER — SODIUM CHLORIDE 0.9 % IV SOLN: 500.0000 mL | INTRAVENOUS | Status: DC

## 2013-03-02 MED ORDER — AMBULATORY NON FORMULARY MEDICATION: Status: DC

## 2013-03-02 NOTE — Op Note (Signed)
[facility] [address] [place], [address]   COLONOSCOPY PROCEDURE REPORT  PATIENT: David Armstrong, David Armstrong  MR#: [id number] BIRTHDATE: [birth year] , 61  yrs. old GENDER: Male ENDOSCOPIST: Mardella Layman, MD, [facility] REFERRED BY: PROCEDURE DATE:  03/02/2013 PROCEDURE:   Colonoscopy, screening Prior Negative Screening - Now for repeat screening.  N/A  ASA CLASS:   Class II INDICATIONS:average risk screening. MEDICATIONS: propofol (Diprivan) 200mg  IV  DESCRIPTION OF PROCEDURE:   After the risks benefits and alternatives of the procedure were thoroughly explained, informed consent was obtained.  A digital rectal exam revealed no abnormalities of the rectum.   The LB [id number]  endoscope was introduced through the anus and advanced to the cecum, which was identified by both the appendix and ileocecal valve. No adverse events experienced.   The quality of the prep was excellent, using MoviPrep  The instrument was then slowly withdrawn as the colon was fully examined.      COLON FINDINGS: Mild diverticulosis was noted in the sigmoid colon. The colon was otherwise normal.  There was no diverticulosis, inflammation, polyps or cancers unless previously stated.Anal canal and rectum appear normal.Small cecal lipoma noted.  Retroflexed views revealed no abnormalities. The time to cecum=2 minutes 50 seconds.  Withdrawal time=6 minutes 20 seconds.  The scope was withdrawn and the procedure completed. COMPLICATIONS: There were no complications.  ENDOSCOPIC IMPRESSION: 1.   Mild diverticulosis was noted in the sigmoid colon 2.   The colon was otherwise normal 3.   Idiopathic anal pain syndrome  RECOMMENDATIONS:Trial of 0.125 % cream tid to see if this helps   eSigned:  Mardella Layman, MD, [facility] 03/02/2013 10:47 AM   cc:   PATIENT NAME:  [name], [name] MR#: [id number]

## 2013-03-02 NOTE — Progress Notes (Signed)
No complaints noted in the recovery room. Maw   

## 2013-03-02 NOTE — Progress Notes (Signed)
Called to room to assist during endoscopic procedure.  Patient ID and intended procedure confirmed with present staff. Received instructions for my participation in the procedure from the performing physician.  

## 2013-03-02 NOTE — Patient Instructions (Signed)
YOU HAD AN ENDOSCOPIC PROCEDURE TODAY AT THE [facility]: Refer to the procedure report that was given to you for any specific questions about what was found during the examination.  If the procedure report does not answer your questions, please call your gastroenterologist to clarify.  If you requested that your care partner not be given the details of your procedure findings, then the procedure report has been included in a sealed envelope for you to review at your convenience later.  YOU SHOULD EXPECT: Some feelings of bloating in the abdomen. Passage of more gas than usual.  Walking can help get rid of the air that was put into your GI tract during the procedure and reduce the bloating. If you had a lower endoscopy (such as a colonoscopy or flexible sigmoidoscopy) you may notice spotting of blood in your stool or on the toilet paper. If you underwent a bowel prep for your procedure, then you may not have a normal bowel movement for a few days.  DIET:  Drink plenty of fluids but you should avoid alcoholic beverages for 24 hours.  Please follow the dilatation diet the rest of the day.  ACTIVITY: Your care partner should take you home directly after the procedure.  You should plan to take it easy, moving slowly for the rest of the day.  You can resume normal activity the day after the procedure however you should NOT DRIVE or use heavy machinery for 24 hours (because of the sedation medicines used during the test).    SYMPTOMS TO REPORT IMMEDIATELY: A gastroenterologist can be reached at any hour.  During normal business hours, 8:30 AM to 5:00 PM Monday through Friday, call [phone].  After hours and on weekends, please call the GI answering service at [phone] who will take a message and have the physician on call contact you.   Following lower endoscopy (colonoscopy or flexible sigmoidoscopy):  Excessive amounts of blood in the stool  Significant tenderness or worsening of  abdominal pains  Swelling of the abdomen that is new, acute  Fever of 100F or higher  Following upper endoscopy (EGD)  Vomiting of blood or coffee ground material  New chest pain or pain under the shoulder blades  Painful or persistently difficult swallowing  New shortness of breath  Fever of 100F or higher  Black, tarry-looking stools  FOLLOW UP: If any biopsies were taken you will be contacted by phone or by letter within the next 1-3 weeks.  Call your gastroenterologist if you have not heard about the biopsies in 3 weeks.  Our staff will call the home number listed on your records the next business day following your procedure to check on you and address any questions or concerns that you may have at that time regarding the information given to you following your procedure. This is a courtesy call and so if there is no answer at the home number and we have not heard from you through the emergency physician on call, we will assume that you have returned to your regular daily activities without incident.  SIGNATURES/CONFIDENTIALITY: You and/or your care partner have signed paperwork which will be entered into your electronic medical record.  These signatures attest to the fact that that the information above on your After Visit Summary has been reviewed and is understood.  Full responsibility of the confidentiality of this discharge information lies with you and/or your care-partner.    Handouts were given to your care partner on diverticulosis,  a high fiber diet with liberal fluid intake, and a dilatation diet. Await biopsy results.   Continue all other current medications today. New rx was called to your pharmacy for cream for your rectum. Please call if any questions or concerns.

## 2013-03-02 NOTE — Op Note (Signed)
[facility] [address] [place], [address]   ENDOSCOPY PROCEDURE REPORT  PATIENT: David Armstrong, David Armstrong  MR#: [id number] BIRTHDATE: Apr 10, 1951 , 61  yrs. old GENDER: Male ENDOSCOPIST:David Hale Bogus, MD, [facility] REFERRED BY: PROCEDURE DATE:  03/02/2013 PROCEDURE:   EGD w/ biopsy , EGD w/ biopsy for H.pylori, and Maloney dilation of esophagus ASA CLASS:    Class II INDICATIONS: Dyspepsia, Epigastric pain, and Follow up of esophageal reflux. MEDICATION: There was residual sedation effect present from prior procedure and Propofol (Diprivan) 70 mg IV TOPICAL ANESTHETIC:  DESCRIPTION OF PROCEDURE:   After the risks and benefits of the procedure were explained, informed consent was obtained.  The LB [id number]  endoscope was introduced through the mouth  and advanced to the    .  The instrument was slowly withdrawn as the mucosa was fully examined.      DUODENUM: The duodenal mucosa showed no abnormalities in the bulb and second portion of the duodenum.  Cold forceps biopsies were taken in the bulb and second portion.  STOMACH: The mucosa of the stomach appeared normal.  CLO Bx. done.  ESOPHAGUS: The mucosa of the esophagus appeared normal. Retroflexed views revealed a small hiatal hernia.  Dilated #84F Maloney dilator.Marland KitchenMarland KitchenNo Barrett's noted...  The scope was then withdrawn from the patient and the procedure completed.  COMPLICATIONS: There were no complications.   ENDOSCOPIC IMPRESSION: 1.   The duodenal mucosa showed no abnormalities in the bulb and second portion of the duodenum...biopsies done 2.   The mucosa of the stomach appeared normal ..Marland KitchenCLO Bx. done 3.   The mucosa of the esophagus appeared normal...stricture dilated   RECOMMENDATIONS: 1.  Await biopsy results 2.  Continue PPI    _______________________________ eSigned:  Mardella Layman, MD, [facility] 03/02/2013 10:55 AM   standard discharge   PATIENT NAME:  Melroy, Bougher MR#: [id number]

## 2013-03-02 NOTE — Progress Notes (Signed)
Pt stable  To RR  

## 2013-03-02 NOTE — Telephone Encounter (Signed)
I called [facility] and gave them an ambulatory Non formulary prescription for Nitroglycerine ointment .0125 mg. 30 gram tube with 1 refill. Also called the patient and left a message on his voice mail we called in the prescription Dr. Jarold Motto wanted him to have to [facility], [facility].

## 2013-03-03 ENCOUNTER — Telehealth: Payer: Self-pay | Admitting: *Deleted

## 2013-03-03 LAB — HELICOBACTER PYLORI SCREEN-BIOPSY: UREASE: NEGATIVE

## 2013-03-03 NOTE — Telephone Encounter (Signed)
  Follow up Call-  Call back number 03/02/2013 12/30/2010  Post procedure Call Back phone  # [phone]  Permission to leave phone message Yes -     Patient questions:  Do you have a fever, pain , or abdominal swelling? no Pain Score  0 *  Have you tolerated food without any problems? yes  Have you been able to return to your normal activities? yes  Do you have any questions about your discharge instructions: Diet   no Medications  no Follow up visit  no  Do you have questions or concerns about your Care? no  Actions: * If pain score is 4 or above: No action needed, pain <4.  Pt states he still has gas.  Advised to avoid gas producing foods today.  Should drink warm liquids, and be up and around to  Help passage of air.  States he has no fever.  Advised to call office if fever develops and abdomen becomes very tight and tender.

## 2013-03-04 ENCOUNTER — Encounter: Payer: Self-pay | Admitting: Gastroenterology

## 2013-03-08 ENCOUNTER — Encounter: Payer: Self-pay | Admitting: Gastroenterology

## 2013-03-23 DIAGNOSIS — M7150 Other bursitis, not elsewhere classified, unspecified site: Secondary | ICD-10-CM | POA: Diagnosis not present

## 2013-03-23 DIAGNOSIS — G5750 Tarsal tunnel syndrome, unspecified lower limb: Secondary | ICD-10-CM | POA: Diagnosis not present

## 2013-03-23 DIAGNOSIS — IMO0002 Reserved for concepts with insufficient information to code with codable children: Secondary | ICD-10-CM | POA: Diagnosis not present

## 2013-03-28 ENCOUNTER — Telehealth: Payer: Self-pay | Admitting: Gastroenterology

## 2013-03-28 MED ORDER — OMEPRAZOLE 40 MG PO CPDR: 40.0000 mg | DELAYED_RELEASE_CAPSULE | Freq: Every day | ORAL | Status: DC

## 2013-03-28 NOTE — Telephone Encounter (Signed)
I called patient back to advise that we do not have samples of Omeprazole but I can send in a RX Patient verbalized understanding, requested RX be sent to [facility]  RX sent

## 2013-03-29 DIAGNOSIS — L570 Actinic keratosis: Secondary | ICD-10-CM | POA: Diagnosis not present

## 2013-03-29 DIAGNOSIS — D235 Other benign neoplasm of skin of trunk: Secondary | ICD-10-CM | POA: Diagnosis not present

## 2013-03-31 DIAGNOSIS — N138 Other obstructive and reflux uropathy: Secondary | ICD-10-CM | POA: Diagnosis not present

## 2013-03-31 DIAGNOSIS — N401 Enlarged prostate with lower urinary tract symptoms: Secondary | ICD-10-CM | POA: Diagnosis not present

## 2013-03-31 DIAGNOSIS — N139 Obstructive and reflux uropathy, unspecified: Secondary | ICD-10-CM | POA: Diagnosis not present

## 2013-03-31 DIAGNOSIS — N200 Calculus of kidney: Secondary | ICD-10-CM | POA: Diagnosis not present

## 2013-05-05 DIAGNOSIS — M25579 Pain in unspecified ankle and joints of unspecified foot: Secondary | ICD-10-CM | POA: Diagnosis not present

## 2013-05-05 DIAGNOSIS — M5137 Other intervertebral disc degeneration, lumbosacral region: Secondary | ICD-10-CM | POA: Diagnosis not present

## 2013-05-05 DIAGNOSIS — M9981 Other biomechanical lesions of cervical region: Secondary | ICD-10-CM | POA: Diagnosis not present

## 2013-05-05 DIAGNOSIS — M999 Biomechanical lesion, unspecified: Secondary | ICD-10-CM | POA: Diagnosis not present

## 2013-05-05 DIAGNOSIS — M722 Plantar fascial fibromatosis: Secondary | ICD-10-CM | POA: Diagnosis not present

## 2013-05-09 DIAGNOSIS — M722 Plantar fascial fibromatosis: Secondary | ICD-10-CM | POA: Diagnosis not present

## 2013-05-09 DIAGNOSIS — M5137 Other intervertebral disc degeneration, lumbosacral region: Secondary | ICD-10-CM | POA: Diagnosis not present

## 2013-05-09 DIAGNOSIS — M9981 Other biomechanical lesions of cervical region: Secondary | ICD-10-CM | POA: Diagnosis not present

## 2013-05-09 DIAGNOSIS — M25579 Pain in unspecified ankle and joints of unspecified foot: Secondary | ICD-10-CM | POA: Diagnosis not present

## 2013-05-09 DIAGNOSIS — M999 Biomechanical lesion, unspecified: Secondary | ICD-10-CM | POA: Diagnosis not present

## 2013-05-12 DIAGNOSIS — M25579 Pain in unspecified ankle and joints of unspecified foot: Secondary | ICD-10-CM | POA: Diagnosis not present

## 2013-05-12 DIAGNOSIS — M9981 Other biomechanical lesions of cervical region: Secondary | ICD-10-CM | POA: Diagnosis not present

## 2013-05-12 DIAGNOSIS — M5137 Other intervertebral disc degeneration, lumbosacral region: Secondary | ICD-10-CM | POA: Diagnosis not present

## 2013-05-12 DIAGNOSIS — M999 Biomechanical lesion, unspecified: Secondary | ICD-10-CM | POA: Diagnosis not present

## 2013-05-12 DIAGNOSIS — M722 Plantar fascial fibromatosis: Secondary | ICD-10-CM | POA: Diagnosis not present

## 2013-05-16 DIAGNOSIS — M722 Plantar fascial fibromatosis: Secondary | ICD-10-CM | POA: Diagnosis not present

## 2013-05-16 DIAGNOSIS — M25579 Pain in unspecified ankle and joints of unspecified foot: Secondary | ICD-10-CM | POA: Diagnosis not present

## 2013-05-16 DIAGNOSIS — M5137 Other intervertebral disc degeneration, lumbosacral region: Secondary | ICD-10-CM | POA: Diagnosis not present

## 2013-05-16 DIAGNOSIS — M9981 Other biomechanical lesions of cervical region: Secondary | ICD-10-CM | POA: Diagnosis not present

## 2013-05-16 DIAGNOSIS — M999 Biomechanical lesion, unspecified: Secondary | ICD-10-CM | POA: Diagnosis not present

## 2013-05-18 DIAGNOSIS — M722 Plantar fascial fibromatosis: Secondary | ICD-10-CM | POA: Diagnosis not present

## 2013-05-18 DIAGNOSIS — M7150 Other bursitis, not elsewhere classified, unspecified site: Secondary | ICD-10-CM | POA: Diagnosis not present

## 2013-05-18 DIAGNOSIS — M659 Synovitis and tenosynovitis, unspecified: Secondary | ICD-10-CM | POA: Diagnosis not present

## 2013-05-18 DIAGNOSIS — M7730 Calcaneal spur, unspecified foot: Secondary | ICD-10-CM | POA: Diagnosis not present

## 2013-05-18 DIAGNOSIS — M65979 Unspecified synovitis and tenosynovitis, unspecified ankle and foot: Secondary | ICD-10-CM | POA: Diagnosis not present

## 2013-05-19 DIAGNOSIS — M5137 Other intervertebral disc degeneration, lumbosacral region: Secondary | ICD-10-CM | POA: Diagnosis not present

## 2013-05-19 DIAGNOSIS — M999 Biomechanical lesion, unspecified: Secondary | ICD-10-CM | POA: Diagnosis not present

## 2013-05-19 DIAGNOSIS — M25579 Pain in unspecified ankle and joints of unspecified foot: Secondary | ICD-10-CM | POA: Diagnosis not present

## 2013-05-19 DIAGNOSIS — M9981 Other biomechanical lesions of cervical region: Secondary | ICD-10-CM | POA: Diagnosis not present

## 2013-05-19 DIAGNOSIS — M722 Plantar fascial fibromatosis: Secondary | ICD-10-CM | POA: Diagnosis not present

## 2013-05-23 DIAGNOSIS — M25579 Pain in unspecified ankle and joints of unspecified foot: Secondary | ICD-10-CM | POA: Diagnosis not present

## 2013-05-23 DIAGNOSIS — M999 Biomechanical lesion, unspecified: Secondary | ICD-10-CM | POA: Diagnosis not present

## 2013-05-23 DIAGNOSIS — M5137 Other intervertebral disc degeneration, lumbosacral region: Secondary | ICD-10-CM | POA: Diagnosis not present

## 2013-05-23 DIAGNOSIS — M9981 Other biomechanical lesions of cervical region: Secondary | ICD-10-CM | POA: Diagnosis not present

## 2013-05-23 DIAGNOSIS — M722 Plantar fascial fibromatosis: Secondary | ICD-10-CM | POA: Diagnosis not present

## 2013-05-25 DIAGNOSIS — M7150 Other bursitis, not elsewhere classified, unspecified site: Secondary | ICD-10-CM | POA: Diagnosis not present

## 2013-05-25 DIAGNOSIS — M659 Synovitis and tenosynovitis, unspecified: Secondary | ICD-10-CM | POA: Diagnosis not present

## 2013-05-25 DIAGNOSIS — M722 Plantar fascial fibromatosis: Secondary | ICD-10-CM | POA: Diagnosis not present

## 2013-05-25 DIAGNOSIS — M65979 Unspecified synovitis and tenosynovitis, unspecified ankle and foot: Secondary | ICD-10-CM | POA: Diagnosis not present

## 2013-05-26 DIAGNOSIS — M999 Biomechanical lesion, unspecified: Secondary | ICD-10-CM | POA: Diagnosis not present

## 2013-05-26 DIAGNOSIS — M9981 Other biomechanical lesions of cervical region: Secondary | ICD-10-CM | POA: Diagnosis not present

## 2013-05-26 DIAGNOSIS — M5137 Other intervertebral disc degeneration, lumbosacral region: Secondary | ICD-10-CM | POA: Diagnosis not present

## 2013-05-26 DIAGNOSIS — M722 Plantar fascial fibromatosis: Secondary | ICD-10-CM | POA: Diagnosis not present

## 2013-05-26 DIAGNOSIS — M25579 Pain in unspecified ankle and joints of unspecified foot: Secondary | ICD-10-CM | POA: Diagnosis not present

## 2013-06-06 DIAGNOSIS — M722 Plantar fascial fibromatosis: Secondary | ICD-10-CM | POA: Diagnosis not present

## 2013-06-06 DIAGNOSIS — M999 Biomechanical lesion, unspecified: Secondary | ICD-10-CM | POA: Diagnosis not present

## 2013-06-06 DIAGNOSIS — M9981 Other biomechanical lesions of cervical region: Secondary | ICD-10-CM | POA: Diagnosis not present

## 2013-06-06 DIAGNOSIS — M5137 Other intervertebral disc degeneration, lumbosacral region: Secondary | ICD-10-CM | POA: Diagnosis not present

## 2013-06-06 DIAGNOSIS — M25579 Pain in unspecified ankle and joints of unspecified foot: Secondary | ICD-10-CM | POA: Diagnosis not present

## 2013-06-08 DIAGNOSIS — M999 Biomechanical lesion, unspecified: Secondary | ICD-10-CM | POA: Diagnosis not present

## 2013-06-08 DIAGNOSIS — M722 Plantar fascial fibromatosis: Secondary | ICD-10-CM | POA: Diagnosis not present

## 2013-06-08 DIAGNOSIS — M12279 Villonodular synovitis (pigmented), unspecified ankle and foot: Secondary | ICD-10-CM | POA: Diagnosis not present

## 2013-06-08 DIAGNOSIS — M25579 Pain in unspecified ankle and joints of unspecified foot: Secondary | ICD-10-CM | POA: Diagnosis not present

## 2013-06-08 DIAGNOSIS — M9981 Other biomechanical lesions of cervical region: Secondary | ICD-10-CM | POA: Diagnosis not present

## 2013-06-08 DIAGNOSIS — M7150 Other bursitis, not elsewhere classified, unspecified site: Secondary | ICD-10-CM | POA: Diagnosis not present

## 2013-06-08 DIAGNOSIS — M5137 Other intervertebral disc degeneration, lumbosacral region: Secondary | ICD-10-CM | POA: Diagnosis not present

## 2013-06-13 DIAGNOSIS — M999 Biomechanical lesion, unspecified: Secondary | ICD-10-CM | POA: Diagnosis not present

## 2013-06-13 DIAGNOSIS — M5137 Other intervertebral disc degeneration, lumbosacral region: Secondary | ICD-10-CM | POA: Diagnosis not present

## 2013-06-13 DIAGNOSIS — M722 Plantar fascial fibromatosis: Secondary | ICD-10-CM | POA: Diagnosis not present

## 2013-06-13 DIAGNOSIS — M9981 Other biomechanical lesions of cervical region: Secondary | ICD-10-CM | POA: Diagnosis not present

## 2013-06-13 DIAGNOSIS — M25579 Pain in unspecified ankle and joints of unspecified foot: Secondary | ICD-10-CM | POA: Diagnosis not present

## 2013-06-14 DIAGNOSIS — D2330 Other benign neoplasm of skin of unspecified part of face: Secondary | ICD-10-CM | POA: Diagnosis not present

## 2013-06-14 DIAGNOSIS — D2370 Other benign neoplasm of skin of unspecified lower limb, including hip: Secondary | ICD-10-CM | POA: Diagnosis not present

## 2013-06-15 DIAGNOSIS — M25579 Pain in unspecified ankle and joints of unspecified foot: Secondary | ICD-10-CM | POA: Diagnosis not present

## 2013-06-15 DIAGNOSIS — M999 Biomechanical lesion, unspecified: Secondary | ICD-10-CM | POA: Diagnosis not present

## 2013-06-15 DIAGNOSIS — M659 Synovitis and tenosynovitis, unspecified: Secondary | ICD-10-CM | POA: Diagnosis not present

## 2013-06-15 DIAGNOSIS — M9981 Other biomechanical lesions of cervical region: Secondary | ICD-10-CM | POA: Diagnosis not present

## 2013-06-15 DIAGNOSIS — M7150 Other bursitis, not elsewhere classified, unspecified site: Secondary | ICD-10-CM | POA: Diagnosis not present

## 2013-06-15 DIAGNOSIS — M722 Plantar fascial fibromatosis: Secondary | ICD-10-CM | POA: Diagnosis not present

## 2013-06-15 DIAGNOSIS — M5137 Other intervertebral disc degeneration, lumbosacral region: Secondary | ICD-10-CM | POA: Diagnosis not present

## 2013-06-15 DIAGNOSIS — M65979 Unspecified synovitis and tenosynovitis, unspecified ankle and foot: Secondary | ICD-10-CM | POA: Diagnosis not present

## 2013-06-17 DIAGNOSIS — M79609 Pain in unspecified limb: Secondary | ICD-10-CM | POA: Diagnosis not present

## 2013-06-20 DIAGNOSIS — M25579 Pain in unspecified ankle and joints of unspecified foot: Secondary | ICD-10-CM | POA: Diagnosis not present

## 2013-06-20 DIAGNOSIS — M5430 Sciatica, unspecified side: Secondary | ICD-10-CM | POA: Diagnosis not present

## 2013-06-20 DIAGNOSIS — M999 Biomechanical lesion, unspecified: Secondary | ICD-10-CM | POA: Diagnosis not present

## 2013-06-20 DIAGNOSIS — M722 Plantar fascial fibromatosis: Secondary | ICD-10-CM | POA: Diagnosis not present

## 2013-06-22 DIAGNOSIS — M999 Biomechanical lesion, unspecified: Secondary | ICD-10-CM | POA: Diagnosis not present

## 2013-06-22 DIAGNOSIS — M5430 Sciatica, unspecified side: Secondary | ICD-10-CM | POA: Diagnosis not present

## 2013-06-22 DIAGNOSIS — M25579 Pain in unspecified ankle and joints of unspecified foot: Secondary | ICD-10-CM | POA: Diagnosis not present

## 2013-06-22 DIAGNOSIS — M722 Plantar fascial fibromatosis: Secondary | ICD-10-CM | POA: Diagnosis not present

## 2013-06-27 DIAGNOSIS — M722 Plantar fascial fibromatosis: Secondary | ICD-10-CM | POA: Diagnosis not present

## 2013-06-27 DIAGNOSIS — M5430 Sciatica, unspecified side: Secondary | ICD-10-CM | POA: Diagnosis not present

## 2013-06-27 DIAGNOSIS — M999 Biomechanical lesion, unspecified: Secondary | ICD-10-CM | POA: Diagnosis not present

## 2013-06-27 DIAGNOSIS — M25579 Pain in unspecified ankle and joints of unspecified foot: Secondary | ICD-10-CM | POA: Diagnosis not present

## 2013-06-29 DIAGNOSIS — M25579 Pain in unspecified ankle and joints of unspecified foot: Secondary | ICD-10-CM | POA: Diagnosis not present

## 2013-06-29 DIAGNOSIS — M722 Plantar fascial fibromatosis: Secondary | ICD-10-CM | POA: Diagnosis not present

## 2013-06-29 DIAGNOSIS — M5430 Sciatica, unspecified side: Secondary | ICD-10-CM | POA: Diagnosis not present

## 2013-06-29 DIAGNOSIS — M999 Biomechanical lesion, unspecified: Secondary | ICD-10-CM | POA: Diagnosis not present

## 2013-06-30 DIAGNOSIS — G609 Hereditary and idiopathic neuropathy, unspecified: Secondary | ICD-10-CM | POA: Diagnosis not present

## 2013-06-30 DIAGNOSIS — IMO0002 Reserved for concepts with insufficient information to code with codable children: Secondary | ICD-10-CM | POA: Diagnosis not present

## 2013-06-30 DIAGNOSIS — R634 Abnormal weight loss: Secondary | ICD-10-CM | POA: Diagnosis not present

## 2013-06-30 DIAGNOSIS — R209 Unspecified disturbances of skin sensation: Secondary | ICD-10-CM | POA: Diagnosis not present

## 2013-06-30 DIAGNOSIS — G5610 Other lesions of median nerve, unspecified upper limb: Secondary | ICD-10-CM | POA: Diagnosis not present

## 2013-06-30 DIAGNOSIS — M5412 Radiculopathy, cervical region: Secondary | ICD-10-CM | POA: Diagnosis not present

## 2013-06-30 DIAGNOSIS — E559 Vitamin D deficiency, unspecified: Secondary | ICD-10-CM | POA: Diagnosis not present

## 2013-07-04 DIAGNOSIS — M25579 Pain in unspecified ankle and joints of unspecified foot: Secondary | ICD-10-CM | POA: Diagnosis not present

## 2013-07-04 DIAGNOSIS — M5430 Sciatica, unspecified side: Secondary | ICD-10-CM | POA: Diagnosis not present

## 2013-07-04 DIAGNOSIS — M722 Plantar fascial fibromatosis: Secondary | ICD-10-CM | POA: Diagnosis not present

## 2013-07-04 DIAGNOSIS — M999 Biomechanical lesion, unspecified: Secondary | ICD-10-CM | POA: Diagnosis not present

## 2013-07-07 DIAGNOSIS — M5430 Sciatica, unspecified side: Secondary | ICD-10-CM | POA: Diagnosis not present

## 2013-07-07 DIAGNOSIS — M25579 Pain in unspecified ankle and joints of unspecified foot: Secondary | ICD-10-CM | POA: Diagnosis not present

## 2013-07-07 DIAGNOSIS — M722 Plantar fascial fibromatosis: Secondary | ICD-10-CM | POA: Diagnosis not present

## 2013-07-07 DIAGNOSIS — M999 Biomechanical lesion, unspecified: Secondary | ICD-10-CM | POA: Diagnosis not present

## 2013-07-11 DIAGNOSIS — M722 Plantar fascial fibromatosis: Secondary | ICD-10-CM | POA: Diagnosis not present

## 2013-07-11 DIAGNOSIS — M5430 Sciatica, unspecified side: Secondary | ICD-10-CM | POA: Diagnosis not present

## 2013-07-11 DIAGNOSIS — M999 Biomechanical lesion, unspecified: Secondary | ICD-10-CM | POA: Diagnosis not present

## 2013-07-11 DIAGNOSIS — M25579 Pain in unspecified ankle and joints of unspecified foot: Secondary | ICD-10-CM | POA: Diagnosis not present

## 2013-07-13 DIAGNOSIS — M999 Biomechanical lesion, unspecified: Secondary | ICD-10-CM | POA: Diagnosis not present

## 2013-07-13 DIAGNOSIS — M25579 Pain in unspecified ankle and joints of unspecified foot: Secondary | ICD-10-CM | POA: Diagnosis not present

## 2013-07-13 DIAGNOSIS — M5430 Sciatica, unspecified side: Secondary | ICD-10-CM | POA: Diagnosis not present

## 2013-07-13 DIAGNOSIS — M722 Plantar fascial fibromatosis: Secondary | ICD-10-CM | POA: Diagnosis not present

## 2013-07-18 DIAGNOSIS — M722 Plantar fascial fibromatosis: Secondary | ICD-10-CM | POA: Diagnosis not present

## 2013-07-18 DIAGNOSIS — M25579 Pain in unspecified ankle and joints of unspecified foot: Secondary | ICD-10-CM | POA: Diagnosis not present

## 2013-07-18 DIAGNOSIS — M5430 Sciatica, unspecified side: Secondary | ICD-10-CM | POA: Diagnosis not present

## 2013-07-18 DIAGNOSIS — M999 Biomechanical lesion, unspecified: Secondary | ICD-10-CM | POA: Diagnosis not present

## 2013-07-21 DIAGNOSIS — M999 Biomechanical lesion, unspecified: Secondary | ICD-10-CM | POA: Diagnosis not present

## 2013-07-21 DIAGNOSIS — M722 Plantar fascial fibromatosis: Secondary | ICD-10-CM | POA: Diagnosis not present

## 2013-07-21 DIAGNOSIS — M25579 Pain in unspecified ankle and joints of unspecified foot: Secondary | ICD-10-CM | POA: Diagnosis not present

## 2013-07-21 DIAGNOSIS — M5430 Sciatica, unspecified side: Secondary | ICD-10-CM | POA: Diagnosis not present

## 2013-07-25 DIAGNOSIS — M25579 Pain in unspecified ankle and joints of unspecified foot: Secondary | ICD-10-CM | POA: Diagnosis not present

## 2013-07-25 DIAGNOSIS — M999 Biomechanical lesion, unspecified: Secondary | ICD-10-CM | POA: Diagnosis not present

## 2013-07-25 DIAGNOSIS — M722 Plantar fascial fibromatosis: Secondary | ICD-10-CM | POA: Diagnosis not present

## 2013-07-25 DIAGNOSIS — M5430 Sciatica, unspecified side: Secondary | ICD-10-CM | POA: Diagnosis not present

## 2013-07-27 DIAGNOSIS — M999 Biomechanical lesion, unspecified: Secondary | ICD-10-CM | POA: Diagnosis not present

## 2013-07-27 DIAGNOSIS — M25579 Pain in unspecified ankle and joints of unspecified foot: Secondary | ICD-10-CM | POA: Diagnosis not present

## 2013-07-27 DIAGNOSIS — G5760 Lesion of plantar nerve, unspecified lower limb: Secondary | ICD-10-CM | POA: Diagnosis not present

## 2013-07-27 DIAGNOSIS — M779 Enthesopathy, unspecified: Secondary | ICD-10-CM | POA: Diagnosis not present

## 2013-07-27 DIAGNOSIS — M722 Plantar fascial fibromatosis: Secondary | ICD-10-CM | POA: Diagnosis not present

## 2013-07-27 DIAGNOSIS — M65979 Unspecified synovitis and tenosynovitis, unspecified ankle and foot: Secondary | ICD-10-CM | POA: Diagnosis not present

## 2013-07-27 DIAGNOSIS — M5430 Sciatica, unspecified side: Secondary | ICD-10-CM | POA: Diagnosis not present

## 2013-07-27 DIAGNOSIS — M659 Synovitis and tenosynovitis, unspecified: Secondary | ICD-10-CM | POA: Diagnosis not present

## 2013-08-01 DIAGNOSIS — M999 Biomechanical lesion, unspecified: Secondary | ICD-10-CM | POA: Diagnosis not present

## 2013-08-01 DIAGNOSIS — M5430 Sciatica, unspecified side: Secondary | ICD-10-CM | POA: Diagnosis not present

## 2013-08-01 DIAGNOSIS — M722 Plantar fascial fibromatosis: Secondary | ICD-10-CM | POA: Diagnosis not present

## 2013-08-01 DIAGNOSIS — M25579 Pain in unspecified ankle and joints of unspecified foot: Secondary | ICD-10-CM | POA: Diagnosis not present

## 2013-08-03 DIAGNOSIS — M659 Synovitis and tenosynovitis, unspecified: Secondary | ICD-10-CM | POA: Diagnosis not present

## 2013-08-03 DIAGNOSIS — M779 Enthesopathy, unspecified: Secondary | ICD-10-CM | POA: Diagnosis not present

## 2013-08-03 DIAGNOSIS — G5760 Lesion of plantar nerve, unspecified lower limb: Secondary | ICD-10-CM | POA: Diagnosis not present

## 2013-08-03 DIAGNOSIS — M65979 Unspecified synovitis and tenosynovitis, unspecified ankle and foot: Secondary | ICD-10-CM | POA: Diagnosis not present

## 2013-08-29 DIAGNOSIS — G5760 Lesion of plantar nerve, unspecified lower limb: Secondary | ICD-10-CM | POA: Diagnosis not present

## 2013-08-29 DIAGNOSIS — M659 Synovitis and tenosynovitis, unspecified: Secondary | ICD-10-CM | POA: Diagnosis not present

## 2013-08-29 DIAGNOSIS — M779 Enthesopathy, unspecified: Secondary | ICD-10-CM | POA: Diagnosis not present

## 2013-08-29 DIAGNOSIS — M65979 Unspecified synovitis and tenosynovitis, unspecified ankle and foot: Secondary | ICD-10-CM | POA: Diagnosis not present

## 2013-09-15 ENCOUNTER — Ambulatory Visit (INDEPENDENT_AMBULATORY_CARE_PROVIDER_SITE_OTHER): Payer: Medicare Other | Admitting: Internal Medicine

## 2013-09-15 ENCOUNTER — Encounter: Payer: Self-pay | Admitting: Internal Medicine

## 2013-09-15 VITALS — BP 110/70 | HR 83 | Temp 97.7°F | Resp 20 | Ht 71.0 in | Wt 182.0 lb

## 2013-09-15 DIAGNOSIS — G629 Polyneuropathy, unspecified: Secondary | ICD-10-CM

## 2013-09-15 DIAGNOSIS — F329 Major depressive disorder, single episode, unspecified: Secondary | ICD-10-CM | POA: Diagnosis not present

## 2013-09-15 DIAGNOSIS — F411 Generalized anxiety disorder: Secondary | ICD-10-CM | POA: Diagnosis not present

## 2013-09-15 DIAGNOSIS — M542 Cervicalgia: Secondary | ICD-10-CM

## 2013-09-15 DIAGNOSIS — G609 Hereditary and idiopathic neuropathy, unspecified: Secondary | ICD-10-CM | POA: Diagnosis not present

## 2013-09-15 DIAGNOSIS — F3289 Other specified depressive episodes: Secondary | ICD-10-CM | POA: Diagnosis not present

## 2013-09-15 MED ORDER — TRAMADOL HCL 50 MG PO TABS: 50.0000 mg | ORAL_TABLET | Freq: Three times a day (TID) | ORAL | Status: DC | PRN

## 2013-09-15 MED ORDER — HYOSCYAMINE SULFATE 0.125 MG SL SUBL: 0.1250 mg | SUBLINGUAL_TABLET | SUBLINGUAL | Status: DC | PRN

## 2013-09-15 MED ORDER — DESONIDE 0.05 % EX CREA: TOPICAL_CREAM | Freq: Two times a day (BID) | CUTANEOUS | Status: DC

## 2013-09-15 NOTE — Progress Notes (Signed)
Pre-visit discussion using our clinic review tool. No additional management support is needed unless otherwise documented below in the visit note.  

## 2013-09-15 NOTE — Patient Instructions (Signed)
Follow up with Dr. Delilah Shan as scheduled , as well as [facility] neurology  Call or return to clinic prn if these symptoms worsen or fail to improve as anticipated.

## 2013-09-15 NOTE — Progress Notes (Signed)
Subjective:    Patient ID: David Armstrong, male    DOB: 10-Jul-1950, 63 y.o.   MRN: [id number]  HPI  63 year old patient, who presents today with chief complaint of headache and neck pain.  He states his incontinence is not a laxative and 02/07/1995.  It has been worse over the past one or 2 months.  He says that he is followed closely by [facility] neurology at three-month intervals and has received frequent injections to the neck and upper back area.  Pain is maximal in the right posterior neck area.  He also complains of chronic arm pain that he attributes to a peripheral neuropathy.  Historically, it appears she has had nerve conduction studies performed, but no recent radiographs or neck MRIs.  He has been referred to orthopedics and will be evaluated tomorrow.  Denies any fever or constitutional complaints He requests a multiple medication refills His history chronic anxiety, depression, and IBS  Past Medical History  Diagnosis Date  . ANXIETY   . Personal history of colonic polyps 10/27/2007    HYPERPLASTIC POLYP  . DEPRESSION   . DIVERTICULOSIS, COLON [phone]    Colonoscopy  . GERD   . HYPERLIPIDEMIA   . RENAL CALCULUS   . IBS (irritable bowel syndrome)   . Hiatal hernia [phone]    EGD  . H. pylori infection   . Arthritis   . Anal fissure   . TMJ (temporomandibular joint syndrome)   . Unspecified gastritis and gastroduodenitis without mention of hemorrhage 2009    EGD  . Hemorrhoids [id number]    Colonoscopy   . Peripheral neuropathy     History   Social History  . Marital Status: Single    Spouse Name: N/A    Number of Children: 0  . Years of Education: N/A   Occupational History  . retired [employer]    disabled now   Social History Main Topics  . Smoking status: Never Smoker   . Smokeless tobacco: Never Used  . Alcohol Use: No  . Drug Use: No  . Sexual Activity: Not on file   Other Topics Concern  . Not on file   Social History Narrative  .  No narrative on file    Past Surgical History  Procedure Laterality Date  . Knee surgery Left   . Shoulder surgery Bilateral 07/2005, 03/2011    left x 2  . Heel spur surgery Bilateral   . Hammer toe surgery Left   . Lithotripsy    . 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 Partial knee arthroplasty Right   . Foot neuroma surgery Right     Family History  Problem Relation Age of Onset  . Heart disease Mother   . Stroke Mother   . Cirrhosis Father   . Heart disease Father   . Skin cancer Father   . Colon cancer Neg Hx   . Prostate cancer Maternal Grandfather   . Parkinson's disease Paternal Grandfather   . Heart disease Brother     Allergies  Allergen Reactions  . Dexilant [Dexlansoprazole] Other (See Comments)    Muscle aches  . Soma [Carisoprodol]     Sores on arm  . Sulfamethoxazole     REACTION: unspecified  . Ivp Dye [Iodinated Diagnostic Agents] Rash    Current Outpatient Prescriptions on File Prior to Visit  Medication Sig Dispense Refill  . Hydrocortisone Butyrate 0.1 % SOLN       . LORazepam (ATIVAN) 1 MG tablet  Take 1 mg by mouth every 8 (eight) hours.       Marland Kitchen ECOZA 1 % FOAM       . Omega-3 Fatty Acids (FISH OIL PO) Take 1 tablet by mouth daily.      Marland Kitchen omeprazole (PRILOSEC) 40 MG capsule Take 1 capsule (40 mg total) by mouth daily.  30 capsule  3   No current facility-administered medications on file prior to visit.    BP 110/70  Pulse 83  Temp(Src) 97.7 F (36.5 C) (Oral)  Resp 20  Ht 5\' 11"  (1.803 m)  Wt 182 lb (82.555 kg)  BMI 25.40 kg/m2  SpO2 98%       Review of Systems  Constitutional: Negative for fever, chills, appetite change and fatigue.  HENT: Negative for congestion, dental problem, ear pain, hearing loss, sore throat, tinnitus, trouble swallowing and voice change.   Eyes: Negative for pain, discharge and visual disturbance.  Respiratory: Negative for cough, chest tightness, wheezing and stridor.   Cardiovascular: Negative for  chest pain, palpitations and leg swelling.  Gastrointestinal: Negative for nausea, vomiting, abdominal pain, diarrhea, constipation, blood in stool and abdominal distention.  Genitourinary: Negative for urgency, hematuria, flank pain, discharge, difficulty urinating and genital sores.  Musculoskeletal: Positive for arthralgias, back pain and neck pain. Negative for gait problem, joint swelling, myalgias and neck stiffness.  Skin: Negative for rash.  Neurological: Positive for numbness. Negative for dizziness, syncope, speech difficulty, weakness and headaches.  Hematological: Negative for adenopathy. Does not bruise/bleed easily.  Psychiatric/Behavioral: Negative for behavioral problems and dysphoric mood. The patient is not nervous/anxious.        Objective:   Physical Exam  Constitutional: He appears well-nourished. No distress.  Blood pressure, low normal Afebrile No distress  Neck: Normal range of motion. Neck supple.  Neurological:  Normal grip strength arm extension and flexion Reflexes a bit blunted but normal          Assessment & Plan:   Chronic posterior neck pain.  Follow neurology.  Followup orthopedic surgery tomorrow as scheduled.  Was given a prescription for tramadol for pain History peripheral neuropathy.  Followup [facility] History of anxiety, depression  Schedule CPX with PCP Medications updated

## 2013-09-16 DIAGNOSIS — R209 Unspecified disturbances of skin sensation: Secondary | ICD-10-CM | POA: Diagnosis not present

## 2013-09-16 DIAGNOSIS — M542 Cervicalgia: Secondary | ICD-10-CM | POA: Diagnosis not present

## 2013-09-22 DIAGNOSIS — IMO0002 Reserved for concepts with insufficient information to code with codable children: Secondary | ICD-10-CM | POA: Diagnosis not present

## 2013-09-22 DIAGNOSIS — G5610 Other lesions of median nerve, unspecified upper limb: Secondary | ICD-10-CM | POA: Diagnosis not present

## 2013-09-22 DIAGNOSIS — G609 Hereditary and idiopathic neuropathy, unspecified: Secondary | ICD-10-CM | POA: Diagnosis not present

## 2013-09-22 DIAGNOSIS — M5412 Radiculopathy, cervical region: Secondary | ICD-10-CM | POA: Diagnosis not present

## 2013-09-23 DIAGNOSIS — H01009 Unspecified blepharitis unspecified eye, unspecified eyelid: Secondary | ICD-10-CM | POA: Diagnosis not present

## 2013-09-23 DIAGNOSIS — H00019 Hordeolum externum unspecified eye, unspecified eyelid: Secondary | ICD-10-CM | POA: Diagnosis not present

## 2013-09-24 DIAGNOSIS — M542 Cervicalgia: Secondary | ICD-10-CM | POA: Diagnosis not present

## 2013-10-04 DIAGNOSIS — M4690 Unspecified inflammatory spondylopathy, site unspecified: Secondary | ICD-10-CM | POA: Diagnosis not present

## 2013-10-11 DIAGNOSIS — D2330 Other benign neoplasm of skin of unspecified part of face: Secondary | ICD-10-CM | POA: Diagnosis not present

## 2013-10-11 DIAGNOSIS — L570 Actinic keratosis: Secondary | ICD-10-CM | POA: Diagnosis not present

## 2013-10-13 DIAGNOSIS — R51 Headache: Secondary | ICD-10-CM | POA: Diagnosis not present

## 2013-10-14 DIAGNOSIS — H00019 Hordeolum externum unspecified eye, unspecified eyelid: Secondary | ICD-10-CM | POA: Diagnosis not present

## 2013-10-14 DIAGNOSIS — L719 Rosacea, unspecified: Secondary | ICD-10-CM | POA: Diagnosis not present

## 2013-11-02 DIAGNOSIS — H0019 Chalazion unspecified eye, unspecified eyelid: Secondary | ICD-10-CM | POA: Diagnosis not present

## 2013-11-02 DIAGNOSIS — H00019 Hordeolum externum unspecified eye, unspecified eyelid: Secondary | ICD-10-CM | POA: Diagnosis not present

## 2013-12-12 DIAGNOSIS — H02849 Edema of unspecified eye, unspecified eyelid: Secondary | ICD-10-CM | POA: Diagnosis not present

## 2013-12-12 DIAGNOSIS — H11439 Conjunctival hyperemia, unspecified eye: Secondary | ICD-10-CM | POA: Diagnosis not present

## 2013-12-13 ENCOUNTER — Ambulatory Visit: Payer: Medicare Other | Admitting: Family Medicine

## 2013-12-13 DIAGNOSIS — L570 Actinic keratosis: Secondary | ICD-10-CM | POA: Diagnosis not present

## 2013-12-15 ENCOUNTER — Encounter: Payer: Self-pay | Admitting: Family Medicine

## 2013-12-15 ENCOUNTER — Ambulatory Visit (INDEPENDENT_AMBULATORY_CARE_PROVIDER_SITE_OTHER): Payer: Medicare Other | Admitting: Family Medicine

## 2013-12-15 VITALS — BP 104/72 | HR 71 | Ht 71.0 in | Wt 179.0 lb

## 2013-12-15 DIAGNOSIS — E785 Hyperlipidemia, unspecified: Secondary | ICD-10-CM

## 2013-12-15 DIAGNOSIS — F411 Generalized anxiety disorder: Secondary | ICD-10-CM

## 2013-12-15 DIAGNOSIS — G609 Hereditary and idiopathic neuropathy, unspecified: Secondary | ICD-10-CM

## 2013-12-15 DIAGNOSIS — G629 Polyneuropathy, unspecified: Secondary | ICD-10-CM

## 2013-12-15 MED ORDER — NORTRIPTYLINE HCL 10 MG PO CAPS: 10.0000 mg | ORAL_CAPSULE | Freq: Every day | ORAL | Status: DC

## 2013-12-15 NOTE — Progress Notes (Signed)
CC: David Armstrong is a 63 y.o. male is here for Establish Care   Subjective: HPI:  Pleasant 63 year old here to establish care  Patient reports a history of peripheral neuropathy diagnosed 2-3 years ago that was present on a daily basis. He describes it as a stinging and burning sensation in his appendages that is worse with inactivity and improves when he is active and distracted from the pain. Pain is mild in severity during the day moderate at night when trying to go to bed. He has tried gabapentin in the past with mild improvement but intolerable itching of the eyes as a side effect.  He denies any other motor or sensory disturbances anywhere in the body. He's currently seeing a neurologist Dr. Domingo Cocking and has been getting what sounds to be epidurals and trigger point injections. Is not currently on any medications for peripheral neuropathy. Has not been getting better or worse for over a year.  Reports history of anxiety and he currently seeing Dr.RUPINDER about every 6 months. He has been on a collection of SSRIs but the only thing that seems to help his anxiety is as needed lorazepam. He reports daily anxiety but it is controlled with the above regimen and not currently interfering with quality of life  Chart review reveals a history of hyperlipidemia. He states he does not believe it is ever done anything to control this.  Review of Systems - General ROS: negative for - chills, fever, night sweats, weight gain or weight loss Ophthalmic ROS: negative for - decreased vision Psychological ROS: negative for -  depression ENT ROS: negative for - hearing change, nasal congestion, tinnitus or allergies Hematological and Lymphatic ROS: negative for - bleeding problems, bruising or swollen lymph nodes Breast ROS: negative Respiratory ROS: no cough, shortness of breath, or wheezing Cardiovascular ROS: no chest pain or dyspnea on exertion Gastrointestinal ROS: no abdominal pain, change in  bowel habits, or black or bloody stools Genito-Urinary ROS: negative for - genital discharge, genital ulcers, incontinence or abnormal bleeding from genitals Musculoskeletal ROS: negative for - joint pain or muscle pain Neurological ROS: negative for - headaches or memory loss Dermatological ROS: negative for lumps, mole changes, rash and skin lesion changes  Past Medical History  Diagnosis Date  . ANXIETY   . Personal history of colonic polyps 10/27/2007    HYPERPLASTIC POLYP  . DEPRESSION   . DIVERTICULOSIS, COLON [phone]    Colonoscopy  . GERD   . HYPERLIPIDEMIA   . RENAL CALCULUS   . IBS (irritable bowel syndrome)   . Hiatal hernia [phone]    EGD  . H. pylori infection   . Arthritis   . Anal fissure   . TMJ (temporomandibular joint syndrome)   . Unspecified gastritis and gastroduodenitis without mention of hemorrhage 2009    EGD  . Hemorrhoids [id number]    Colonoscopy   . Peripheral neuropathy     Past Surgical History  Procedure Laterality Date  . Knee surgery Left   . Shoulder surgery Bilateral 07/2005, 03/2011    left x 2  . Heel spur surgery Bilateral   . Hammer toe surgery Left   . Lithotripsy    . 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 Partial knee arthroplasty Right   . Foot neuroma surgery Right    Family History  Problem Relation Age of Onset  . Heart disease Mother   . Stroke Mother   . Cirrhosis Father   . Heart disease Father   .  Skin cancer Father   . Colon cancer Neg Hx   . Prostate cancer Maternal Grandfather   . Parkinson's disease Paternal Grandfather   . Heart disease Brother     History   Social History  . Marital Status: Single    Spouse Name: N/A    Number of Children: 0  . Years of Education: N/A   Occupational History  . retired [employer]    disabled now   Social History Main Topics  . Smoking status: Never Smoker   . Smokeless tobacco: Never Used  . Alcohol Use: No  . Drug Use: No  . Sexual Activity: Not on  file   Other Topics Concern  . Not on file   Social History Narrative  . No narrative on file     Objective: BP 104/72  Pulse 71  Ht 5\' 11"  (1.803 m)  Wt 179 lb (81.194 kg)  BMI 24.98 kg/m2  General: Alert and Oriented, No Acute Distress HEENT: Pupils equal, round, reactive to light. Conjunctivae clear.  Moist mucous membranes pharynx unremarkable Lungs: Clear to auscultation bilaterally, no wheezing/ronchi/rales.  Comfortable work of breathing. Good air movement. Cardiac: Regular rate and rhythm. Normal S1/S2.  No murmurs, rubs, nor gallops.   Extremities: No peripheral edema.  Strong peripheral pulses. Full range of motion strength in both upper and lower remedies  Mental Status: No depression, anxiety, nor agitation. Skin: Warm and dry.  Assessment & Plan: Harlo was seen today for establish care.  Diagnoses and associated orders for this visit:  HYPERLIPIDEMIA - Lipid panel  ANXIETY  Peripheral neuropathy - COMPLETE METABOLIC PANEL WITH GFR - nortriptyline (PAMELOR) 10 MG capsule; Take 1 capsule (10 mg total) by mouth at bedtime.    Hyperlipidemia: Due for lipid panel Anxiety: Controlled continue  to see Dr.RUPINDER Peripheral neuropathy: Uncontrolled chronic condition check a metabolic panel to rule out diabetic contribution or electrolyte abnormalities. He tells me he had no benefit from amitriptyline for a unrelated condition in the past, we'll try nortriptyline.   Return in about 3 months (around 03/16/2014) for Peripheral Neuropathy or PRN.

## 2013-12-21 DIAGNOSIS — G609 Hereditary and idiopathic neuropathy, unspecified: Secondary | ICD-10-CM | POA: Diagnosis not present

## 2013-12-21 DIAGNOSIS — G5610 Other lesions of median nerve, unspecified upper limb: Secondary | ICD-10-CM | POA: Diagnosis not present

## 2013-12-21 DIAGNOSIS — IMO0002 Reserved for concepts with insufficient information to code with codable children: Secondary | ICD-10-CM | POA: Diagnosis not present

## 2013-12-21 DIAGNOSIS — M542 Cervicalgia: Secondary | ICD-10-CM | POA: Diagnosis not present

## 2013-12-21 DIAGNOSIS — G4700 Insomnia, unspecified: Secondary | ICD-10-CM | POA: Diagnosis not present

## 2014-01-07 ENCOUNTER — Other Ambulatory Visit: Payer: Self-pay | Admitting: Internal Medicine

## 2014-01-23 DIAGNOSIS — H0012 Chalazion right lower eyelid: Secondary | ICD-10-CM | POA: Diagnosis not present

## 2014-01-23 DIAGNOSIS — H02845 Edema of left lower eyelid: Secondary | ICD-10-CM | POA: Diagnosis not present

## 2014-01-23 DIAGNOSIS — H0011 Chalazion right upper eyelid: Secondary | ICD-10-CM | POA: Diagnosis not present

## 2014-01-23 DIAGNOSIS — H0015 Chalazion left lower eyelid: Secondary | ICD-10-CM | POA: Diagnosis not present

## 2014-01-23 DIAGNOSIS — H00012 Hordeolum externum right lower eyelid: Secondary | ICD-10-CM | POA: Diagnosis not present

## 2014-01-23 DIAGNOSIS — H0014 Chalazion left upper eyelid: Secondary | ICD-10-CM | POA: Diagnosis not present

## 2014-01-23 DIAGNOSIS — H018 Other specified inflammations of eyelid: Secondary | ICD-10-CM | POA: Diagnosis not present

## 2014-01-23 DIAGNOSIS — H02844 Edema of left upper eyelid: Secondary | ICD-10-CM | POA: Diagnosis not present

## 2014-02-01 DIAGNOSIS — G609 Hereditary and idiopathic neuropathy, unspecified: Secondary | ICD-10-CM | POA: Diagnosis not present

## 2014-02-01 DIAGNOSIS — E785 Hyperlipidemia, unspecified: Secondary | ICD-10-CM | POA: Diagnosis not present

## 2014-02-02 ENCOUNTER — Encounter: Payer: Self-pay | Admitting: Family Medicine

## 2014-02-02 ENCOUNTER — Telehealth: Payer: Self-pay | Admitting: Family Medicine

## 2014-02-02 LAB — LIPID PANEL
CHOL/HDL RATIO: 4.5 ratio
Cholesterol: 228 mg/dL — ABNORMAL HIGH (ref 0–200)
HDL: 51 mg/dL (ref >39)
LDL Cholesterol: 152 mg/dL — ABNORMAL HIGH (ref 0–99)
Triglycerides: 124 mg/dL (ref <150)
VLDL: 25 mg/dL (ref 0–40)

## 2014-02-02 LAB — COMPLETE METABOLIC PANEL WITH GFR
ALBUMIN: 4 g/dL (ref 3.5–5.2)
ALK PHOS: 94 U/L (ref 39–117)
ALT: 14 U/L (ref 0–53)
AST: 17 U/L (ref 0–37)
BUN: 13 mg/dL (ref 6–23)
CHLORIDE: 105 meq/L (ref 96–112)
CO2: 29 mEq/L (ref 19–32)
Calcium: 9 mg/dL (ref 8.4–10.5)
Creat: 0.81 mg/dL (ref 0.50–1.35)
GFR, Est African American: >89 mL/min
GLUCOSE: 84 mg/dL (ref 70–99)
Potassium: 4.9 mEq/L (ref 3.5–5.3)
SODIUM: 144 meq/L (ref 135–145)
Total Bilirubin: 0.8 mg/dL (ref 0.2–1.2)
Total Protein: 6.4 g/dL (ref 6.0–8.3)

## 2014-02-02 MED ORDER — ATORVASTATIN CALCIUM 20 MG PO TABS: 20.0000 mg | ORAL_TABLET | Freq: Every day | ORAL | Status: DC

## 2014-02-02 NOTE — Telephone Encounter (Signed)
David Armstrong, Will you please let patient know that his LDL cholesterol was moderately elevated to a degree that puts him at significant risk of having a heart attack or stroke in the future.  I'd recommend he start on generic lipitor to help lower his cholesterol and this risk.  Rx has been sent to [facility].  I'd recommend we recheck this in three months.  All other blood work was normal.

## 2014-02-02 NOTE — Telephone Encounter (Signed)
Pt notified and voiced understanding 

## 2014-03-03 DIAGNOSIS — B079 Viral wart, unspecified: Secondary | ICD-10-CM | POA: Diagnosis not present

## 2014-03-03 DIAGNOSIS — M71572 Other bursitis, not elsewhere classified, left ankle and foot: Secondary | ICD-10-CM | POA: Diagnosis not present

## 2014-03-03 DIAGNOSIS — M722 Plantar fascial fibromatosis: Secondary | ICD-10-CM | POA: Diagnosis not present

## 2014-03-13 ENCOUNTER — Telehealth: Payer: Self-pay | Admitting: Gastroenterology

## 2014-03-13 NOTE — Telephone Encounter (Signed)
Former patient of Dr. Sharlett Iles. He has no preference for new MD. He reports upper abdominal pain x 2 weeks. Started after he has his foot injected with Cortisone. Gaviscon helps some. Scheduled with Lori Hvozdovic, PA-C on 03/14/14 at 2:00 PM.

## 2014-03-14 ENCOUNTER — Ambulatory Visit (INDEPENDENT_AMBULATORY_CARE_PROVIDER_SITE_OTHER): Payer: Medicare Other | Admitting: Physician Assistant

## 2014-03-14 ENCOUNTER — Other Ambulatory Visit (INDEPENDENT_AMBULATORY_CARE_PROVIDER_SITE_OTHER): Payer: Medicare Other

## 2014-03-14 ENCOUNTER — Encounter: Payer: Self-pay | Admitting: Physician Assistant

## 2014-03-14 VITALS — BP 110/62 | HR 84 | Ht 70.25 in | Wt 174.0 lb

## 2014-03-14 DIAGNOSIS — R1011 Right upper quadrant pain: Secondary | ICD-10-CM

## 2014-03-14 DIAGNOSIS — R1013 Epigastric pain: Secondary | ICD-10-CM | POA: Diagnosis not present

## 2014-03-14 DIAGNOSIS — L720 Epidermal cyst: Secondary | ICD-10-CM | POA: Diagnosis not present

## 2014-03-14 DIAGNOSIS — L570 Actinic keratosis: Secondary | ICD-10-CM | POA: Diagnosis not present

## 2014-03-14 DIAGNOSIS — L309 Dermatitis, unspecified: Secondary | ICD-10-CM | POA: Diagnosis not present

## 2014-03-14 LAB — CBC WITH DIFFERENTIAL/PLATELET
Basophils Absolute: 0 10*3/uL (ref 0.0–0.1)
Basophils Relative: 0.6 % (ref 0.0–3.0)
EOS ABS: 0.4 10*3/uL (ref 0.0–0.7)
Eosinophils Relative: 5 % (ref 0.0–5.0)
HEMATOCRIT: 43.7 % (ref 39.0–52.0)
Hemoglobin: 14.7 g/dL (ref 13.0–17.0)
Lymphocytes Relative: 23.6 % (ref 12.0–46.0)
Lymphs Abs: 1.7 10*3/uL (ref 0.7–4.0)
MCHC: 33.5 g/dL (ref 30.0–36.0)
MCV: 89.1 fl (ref 78.0–100.0)
MONO ABS: 0.7 10*3/uL (ref 0.1–1.0)
Monocytes Relative: 10 % (ref 3.0–12.0)
NEUTROS PCT: 60.8 % (ref 43.0–77.0)
Neutro Abs: 4.4 10*3/uL (ref 1.4–7.7)
PLATELETS: 206 10*3/uL (ref 150.0–400.0)
RBC: 4.91 Mil/uL (ref 4.22–5.81)
RDW: 14 % (ref 11.5–15.5)
WBC: 7.3 10*3/uL (ref 4.0–10.5)

## 2014-03-14 MED ORDER — RANITIDINE HCL 300 MG PO TABS: 300.0000 mg | ORAL_TABLET | Freq: Every day | ORAL | Status: DC

## 2014-03-14 MED ORDER — SUCRALFATE 1 GM/10ML PO SUSP: 1.0000 g | Freq: Three times a day (TID) | ORAL | Status: DC

## 2014-03-14 NOTE — Patient Instructions (Addendum)
We have sent the following medications to your pharmacy for you to pick up at your convenience:  Ranitidine, Carafate  Your physician has requested that you go to the basement for lab work before leaving today  You have been scheduled for an abdominal ultrasound at [facility] (1st floor of hospital) on 03/16/2014 at 8:30am. Please arrive 15 minutes prior to your appointment for registration. Make certain not to have anything to eat or drink 6 hours prior to your appointment. Should you need to reschedule your appointment, please contact radiology at [phone]. This test typically takes about 30 minutes to perform.  Please follow up with Dr. Hilarie Fredrickson or Cecille Rubin in 3-4 weeks

## 2014-03-14 NOTE — Progress Notes (Addendum)
Patient ID: David Armstrong, male   DOB: [birth year], 63 y.o.   MRN: [id number]     History of Present Illness: David Armstrong is a 63 year old male who was previously followed by Dr. Sharlett Iles. He has a long-standing history of IBS and acid reflux. He last had an EGD in November 2014. The duodenal mucosa showed no abnormalities, the mucosa of the stomach appeared normal, the mucosa of the esophagus appeared normal and a stricture was dilated with a Maloney dilator. Review of his chart shows a history of refractory GERD which Dr. Sharlett Iles thought may require surgical intervention because of the patient's inability to take PPI medications. Dr. 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 ( gastric emptying and esophageal manometry).  David Armstrong is here today with a 2 week history of epigastric and right upper quadrant pain that radiates in to his back.The pain is associated with nausea but no vomiting. He has had no fever or chills. His stools have been clay colored but he denies dark urine his stools have been more on the forearms side but he doesn't consider himself constipated. The pain tends to occur about 45 minutes after meals and lasts for several hours. The pain is associated with some heartburn, belching,  and bloating. He has not been intolerant of PPIs in the past. His pain does not radiate. He doesn't feel like eating that much because he is afraid of getting the pain.   Past Medical History  Diagnosis Date  . ANXIETY   . Personal history of colonic polyps 10/27/2007    HYPERPLASTIC POLYP  . DEPRESSION   . DIVERTICULOSIS, COLON [phone]    Colonoscopy  . GERD   . HYPERLIPIDEMIA   . RENAL CALCULUS   . IBS (irritable bowel syndrome)   . Hiatal hernia [phone]    EGD  . H. pylori infection   . Arthritis   . Anal fissure   . TMJ (temporomandibular joint syndrome)   . Unspecified gastritis and  gastroduodenitis without mention of hemorrhage 2009    EGD  . Hemorrhoids [id number]    Colonoscopy   . Peripheral neuropathy     Past Surgical History  Procedure Laterality Date  . Knee surgery Left   . Shoulder surgery Bilateral 07/2005, 03/2011    left x 2  . Heel spur surgery Bilateral   . Hammer toe surgery Left   . Lithotripsy    . 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 Partial knee arthroplasty Right   . Foot neuroma surgery Right   . Eye lid surgery Bilateral 01/2014   Family History  Problem Relation Age of Onset  . Heart disease Mother   . Stroke Mother   . Cirrhosis Father   . Heart disease Father   . Skin cancer Father   . Colon cancer Neg Hx   . Prostate cancer Maternal Grandfather   . Parkinson's disease Paternal Grandfather   . Heart disease Brother    History  Substance Use Topics  . Smoking status: Never Smoker   . Smokeless tobacco: Never Used  . Alcohol Use: No   Current Outpatient Prescriptions  Medication Sig Dispense Refill  . desonide (DESOWEN) 0.05 % lotion   0  . LORazepam (ATIVAN) 1 MG tablet Take 1 mg by mouth every 8 (eight) hours.     . ranitidine (ZANTAC) 300 MG tablet Take 1 tablet (300 mg total) by mouth at bedtime. 30 tablet  2  . sucralfate (CARAFATE) 1 GM/10ML suspension Take 10 mLs (1 g total) by mouth 4 (four) times daily -  with meals and at bedtime. 420 mL 2   No current facility-administered medications for this visit.   Allergies  Allergen Reactions  . Dexilant [Dexlansoprazole] Other (See Comments)    Muscle aches  . Soma [Carisoprodol]     Sores on arm  . Sulfamethoxazole     REACTION: unspecified  . Ivp Dye [Iodinated Diagnostic Agents] Rash      Review of Systems: Gen: Denies any fever, chills, sweats, anorexia, fatigue, weakness, malaise, weight loss, and sleep disorder CV: Denies chest pain, angina, palpitations, syncope, orthopnea, PND, peripheral edema, and claudication. Resp: Denies dyspnea at rest, dyspnea  with exercise, cough, sputum, wheezing, coughing up blood, and pleurisy. GI: Denies vomiting blood, jaundice, and fecal incontinence.   Denies dysphagia or odynophagia. GU : Denies urinary burning, blood in urine, urinary frequency, urinary hesitancy, nocturnal urination, and urinary incontinence. MS: Denies joint pain, limitation of movement, and swelling, stiffness, low back pain, extremity pain. Denies muscle weakness, cramps, atrophy.  Derm: Denies rash, itching, dry skin, hives, moles, warts, or unhealing ulcers.  Psych: Denies depression, anxiety, memory loss, suicidal ideation, hallucinations, paranoia, and confusion. Heme: Denies bruising, bleeding, and enlarged lymph nodes. Neuro:  Denies any headaches, dizziness, paresthesia Endo:  Denies any problems with DM, thyroid, adrenal   Physical Exam: General: Pleasant, well developed , pleasant male in no acute distress Head: Normocephalic and atraumatic Eyes:  sclerae anicteric, conjunctiva pink  Ears: Normal auditory acuity Lungs: Clear throughout to auscultation Heart: Regular rate and rhythm Abdomen: Soft, non distended, tender in epigastric area and RUQ, no rebound or guarding, no CVAT,. No masses, no hepatomegaly. Normal bowel soun Musculoskeletal: Symmetrical with no gross deformities  Extremities: No edema  Neurological: Alert oriented x 4, grossly nonfocal Psychological:  Alert and cooperative. Normal mood and affect  Assessment and Recommendations: 62 year old male with a history of GERD now with a 2 week history of epigastric and right upper quadrant pain associated with nausea, bloating, and belching. An antireflux regimen has been reviewed. He will be given a trial of ranitidine 300 mg daily at bedtime along with Carafate 1 g before meals and at bedtime. An abdominal ultrasound will be obtained to evaluate for possible choledocholithiasis, cholecystitis, ductal dilatation, etc. A CBC, hepatic function panel, amylase and  lipase will be obtained. Follow-up recommendations will be made pending the findings of the above tests.  David Armstrong, Vita Barley PA-C 03/14/2014,  Addendum: Reviewed and agree with initial management. Jerene Bears, MD

## 2014-03-15 LAB — COMPREHENSIVE METABOLIC PANEL
ALK PHOS: 91 U/L (ref 39–117)
ALT: 19 U/L (ref 0–53)
AST: 21 U/L (ref 0–37)
Albumin: 3.8 g/dL (ref 3.5–5.2)
BUN: 15 mg/dL (ref 6–23)
CO2: 28 mEq/L (ref 19–32)
Calcium: 8.7 mg/dL (ref 8.4–10.5)
Chloride: 106 mEq/L (ref 96–112)
Creatinine, Ser: 1 mg/dL (ref 0.4–1.5)
GFR: 80.22 mL/min (ref >60.00)
Glucose, Bld: 80 mg/dL (ref 70–99)
Potassium: 4.8 mEq/L (ref 3.5–5.1)
SODIUM: 139 meq/L (ref 135–145)
TOTAL PROTEIN: 6.5 g/dL (ref 6.0–8.3)
Total Bilirubin: 0.7 mg/dL (ref 0.2–1.2)

## 2014-03-15 LAB — AMYLASE: Amylase: 69 U/L (ref 27–131)

## 2014-03-15 LAB — LIPASE: Lipase: 33 U/L (ref 11.0–59.0)

## 2014-03-16 ENCOUNTER — Ambulatory Visit (HOSPITAL_COMMUNITY)
Admission: RE | Admit: 2014-03-16 | Discharge: 2014-03-16 | Disposition: A | Discharge status: Home / Self Care | Payer: Medicare Other | Source: Ambulatory Visit | Attending: Physician Assistant | Admitting: Physician Assistant

## 2014-03-16 DIAGNOSIS — R1013 Epigastric pain: Secondary | ICD-10-CM

## 2014-03-16 DIAGNOSIS — N200 Calculus of kidney: Secondary | ICD-10-CM | POA: Diagnosis not present

## 2014-03-16 DIAGNOSIS — R1011 Right upper quadrant pain: Secondary | ICD-10-CM | POA: Insufficient documentation

## 2014-03-17 DIAGNOSIS — D2371 Other benign neoplasm of skin of right lower limb, including hip: Secondary | ICD-10-CM | POA: Diagnosis not present

## 2014-03-23 DIAGNOSIS — M5417 Radiculopathy, lumbosacral region: Secondary | ICD-10-CM | POA: Diagnosis not present

## 2014-03-23 DIAGNOSIS — M5440 Lumbago with sciatica, unspecified side: Secondary | ICD-10-CM | POA: Diagnosis not present

## 2014-03-23 DIAGNOSIS — G603 Idiopathic progressive neuropathy: Secondary | ICD-10-CM | POA: Diagnosis not present

## 2014-03-23 DIAGNOSIS — R200 Anesthesia of skin: Secondary | ICD-10-CM | POA: Diagnosis not present

## 2014-03-23 DIAGNOSIS — M542 Cervicalgia: Secondary | ICD-10-CM | POA: Diagnosis not present

## 2014-03-30 DIAGNOSIS — N401 Enlarged prostate with lower urinary tract symptoms: Secondary | ICD-10-CM | POA: Diagnosis not present

## 2014-03-30 DIAGNOSIS — Z125 Encounter for screening for malignant neoplasm of prostate: Secondary | ICD-10-CM | POA: Diagnosis not present

## 2014-03-31 DIAGNOSIS — D2371 Other benign neoplasm of skin of right lower limb, including hip: Secondary | ICD-10-CM | POA: Diagnosis not present

## 2014-04-04 ENCOUNTER — Encounter: Payer: Self-pay | Admitting: Physician Assistant

## 2014-04-04 ENCOUNTER — Ambulatory Visit (INDEPENDENT_AMBULATORY_CARE_PROVIDER_SITE_OTHER): Payer: Medicare Other | Admitting: Physician Assistant

## 2014-04-04 VITALS — BP 100/62 | HR 80 | Ht 71.0 in | Wt 166.8 lb

## 2014-04-04 DIAGNOSIS — K589 Irritable bowel syndrome without diarrhea: Secondary | ICD-10-CM | POA: Diagnosis not present

## 2014-04-04 DIAGNOSIS — K648 Other hemorrhoids: Secondary | ICD-10-CM | POA: Diagnosis not present

## 2014-04-04 MED ORDER — MENTHOL-ZINC OXIDE 0.44-20.6 % EX OINT: TOPICAL_OINTMENT | CUTANEOUS | Status: DC

## 2014-04-04 MED ORDER — RIFAXIMIN 550 MG PO TABS: 550.0000 mg | ORAL_TABLET | Freq: Two times a day (BID) | ORAL | Status: DC

## 2014-04-04 MED ORDER — HYDROCORTISONE ACETATE 25 MG RE SUPP: 25.0000 mg | Freq: Two times a day (BID) | RECTAL | Status: DC

## 2014-04-04 NOTE — Progress Notes (Addendum)
Patient ID: David Armstrong, male   DOB: Sep 27, 1950, 63 y.o.   MRN: [id number]     History of Present Illness:   This is a follow-up for this 63 year old male with a long-standing history of IBS and acid reflux who was last seen here on December 1 with epigastric and right upper quadrant pain associated with nausea with no vomiting. He had an ultrasound that was nonrevealing and normal laboratory studies. He was given a trial of ranitidine and Carafate and returns for follow-up his abdominal pain is much better and he has no further nausea. He states now he is troubled with excess gas, bloating, and gurgling. He is having 2 or 3 soft bowel movements per day. He is having rectal itching and a sensation of incomplete evacuation. He has some rectal burning after bowel movements. No bright red blood per rectum or melena. He is most concerned with what he feels is an inordinate amount of gas.   Past Medical History  Diagnosis Date  . ANXIETY   . Personal history of colonic polyps 10/27/2007    HYPERPLASTIC POLYP  . DEPRESSION   . DIVERTICULOSIS, COLON [phone]    Colonoscopy  . GERD   . HYPERLIPIDEMIA   . RENAL CALCULUS   . IBS (irritable bowel syndrome)   . Hiatal hernia [id number]    EGD  . H. pylori infection   . Arthritis   . Anal fissure   . TMJ (temporomandibular joint syndrome)   . Unspecified gastritis and gastroduodenitis without mention of hemorrhage 2009    EGD  . Hemorrhoids [id number]    Colonoscopy   . Peripheral neuropathy     Past Surgical History  Procedure Laterality Date  . Knee surgery Left   . Shoulder surgery Bilateral 07/2005, 03/2011    left x 2  . Heel spur surgery Bilateral   . Hammer toe surgery Left   . Lithotripsy    . 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 Partial knee arthroplasty Right   . Foot neuroma surgery Right   . Eye lid surgery Bilateral 01/2014   Family History  Problem Relation Age of Onset  . Heart disease Mother   . Stroke  Mother   . Cirrhosis Father   . Heart disease Father   . Skin cancer Father   . Colon cancer Neg Hx   . Prostate cancer Maternal Grandfather   . Parkinson's disease Paternal Grandfather   . Heart disease Brother    History  Substance Use Topics  . Smoking status: Never Smoker   . Smokeless tobacco: Never Used  . Alcohol Use: No   Current Outpatient Prescriptions  Medication Sig Dispense Refill  . desonide (DESOWEN) 0.05 % lotion   0  . LORazepam (ATIVAN) 1 MG tablet Take 1 mg by mouth every 8 (eight) hours.     Marland Kitchen RA MAGNESIUM 500 MG CAPS   0  . ranitidine (ZANTAC) 300 MG tablet Take 1 tablet (300 mg total) by mouth at bedtime. 30 tablet 2  . hydrocortisone (ANUSOL-HC) 25 MG suppository Place 1 suppository (25 mg total) rectally 2 (two) times daily. 12 suppository 0  . Menthol-Zinc Oxide (CALMOSEPTINE) 0.44-20.6 % OINT APPLY TO EXTERNAL PERIRECTAL AREA TWICE DAILY FOR TWO WEEKS 71 g 0  . rifaximin (XIFAXAN) 550 MG TABS tablet Take 1 tablet (550 mg total) by mouth 2 (two) times daily. 42 tablet 0   No current facility-administered medications for this visit.   Allergies  Allergen Reactions  . Dexilant [  Dexlansoprazole] Other (See Comments)    Muscle aches  . Soma [Carisoprodol]     Sores on arm  . Sulfamethoxazole     REACTION: unspecified  . Ivp Dye [Iodinated Diagnostic Agents] Rash      Review of Systems: Gen: Denies any fever, chills, sweats, anorexia, fatigue, weakness, malaise, weight loss, and sleep disorder CV: Denies chest pain, angina, palpitations, syncope, orthopnea, PND, peripheral edema, and claudication. Resp: Denies dyspnea at rest, dyspnea with exercise, cough, sputum, wheezing, coughing up blood, and pleurisy. GI: Denies vomiting blood, jaundice, and fecal incontinence.   Denies dysphagia or odynophagia. GU : Denies urinary burning, blood in urine, urinary frequency, urinary hesitancy, nocturnal urination, and urinary incontinence. MS: Denies joint pain,  limitation of movement, and swelling, stiffness, low back pain, extremity pain. Denies muscle weakness, cramps, atrophy.  Derm: Denies rash, itching, dry skin, hives, moles, warts, or unhealing ulcers.  Psych: Denies depression, anxiety, memory loss, suicidal ideation, hallucinations, paranoia, and confusion. Heme: Denies bruising, bleeding, and enlarged lymph nodes. Neuro:  Denies any headaches, dizziness, paresthesia Endo:  Denies any problems with DM, thyroid, adrenal  LAB RESULTS:  Comprehensive metabolic panel on 39/76/7341 had an alkaline phosphatase 91, AST 21, ALT 19, total bili 0.7. Amylase 69. Lipase 33.  Studies:   US Abdomen Complete  03/16/2014   CLINICAL DATA:  Right upper quadrant abdominal pain  EXAM: ULTRASOUND ABDOMEN COMPLETE  COMPARISON:  Abdominal ultrasound of May 20, 2011  FINDINGS: Gallbladder: No gallstones or wall thickening visualized. No sonographic Murphy sign noted.  Common bile duct: Diameter: 4 mm  Liver: No focal lesion identified. The echotexture is mildly increased. No focal mass or ductal dilation is demonstrated.  IVC: Obscured by bowel gas.  Pancreas: Visualized portion unremarkable.  Spleen: Size and appearance within normal limits.  Right Kidney: Length: 10.9 cm. Echogenicity within normal limits. No mass or hydronephrosis visualized.  Left kidney: 12.9 cm in length. There is no hydronephrosis. The cortical echotexture is normal. There is an 8 mm diameter nonobstructing calcified midpole stone.  Abdominal aorta:  No aneurysm demonstrated.  Other findings: None.  IMPRESSION: 1. There is no acute hepatobiliary abnormality. There may be fatty infiltrative change of the liver. 2. There is an 8 mm nonobstructing midpole stone in the left kidney.   Electronically Signed   By: David  Martinique   On: 03/16/2014 09:28   PROCEDURES: Colonoscopy 03/02/13: Mild diverticulosis. EGD 03/02/13: Duodenum and stomach appeared normal. Esophageal stricture  dilated.   Physical Exam: General: Pleasant, well developed fmale in no acute distress Head: Normocephalic and atraumatic Eyes:  sclerae anicteric, conjunctiva pink  Ears: Normal auditory acuity Lungs: Clear throughout to auscultation Heart: Regular rate and rhythm Abdomen: Soft, non distended, non-tender. No masses, no hepatomegaly. Normal bowel sounds Rectal: mild perirectal erythema, anoscopy with internal hemorrhoids Musculoskeletal: Symmetrical with no gross deformities  Extremities: No edema  Neurological: Alert oriented x 4, grossly nonfocal Psychological:  Alert and cooperative. Normal mood and affect  Assessment and Recommendations:  #1. Irritable bowel syndrome. This is likely the etiology for his gas, bloating, gurgling, and soft stools. He will be given a trial of Xifaxan 550 mg 3 times a day for 2 weeks. He's been encouraged to adhere to a high-fiber low-fat diet. He will follow up in 2 months sooner if needed.  #2. Internal hemorrhoids and perianal irritation. He's been advised to use Tucks wipes after bowel movements. He will use calmoseptine  ointment to the perianal area 2-3 times daily for 2  weeks. He will be given a trial of Anusol HC suppositories 1 per rectum twice a day for 10 days.    Peni Rupard, Vita Barley PA-C 04/04/2014,  Addendum: Reviewed and agree with initial management. Jerene Bears, MD

## 2014-04-04 NOTE — Patient Instructions (Addendum)
Please purchase tucks wipes over the counter and use as directed.  We have sent the following medications to your pharmacy for you to pick up at your convenience: Calmoseptine Ointment, apply around rectal area twice daily  Anusol Suppositories, place one suppository per rectum twice daily for ten days   We sent the prescription for Xifaxan to [facility], they will contact you. It will take a couple of days/week to receive prescription.  You will be due for a office visit in two months. We will send you a reminder in the mail when it gets closer to that time.

## 2014-04-10 DIAGNOSIS — N200 Calculus of kidney: Secondary | ICD-10-CM | POA: Diagnosis not present

## 2014-04-10 LAB — PSA: PSA: 0.7

## 2014-04-17 ENCOUNTER — Other Ambulatory Visit: Payer: Self-pay | Admitting: *Deleted

## 2014-04-17 DIAGNOSIS — M5137 Other intervertebral disc degeneration, lumbosacral region: Secondary | ICD-10-CM | POA: Diagnosis not present

## 2014-04-17 DIAGNOSIS — M25572 Pain in left ankle and joints of left foot: Secondary | ICD-10-CM | POA: Diagnosis not present

## 2014-04-17 DIAGNOSIS — D2371 Other benign neoplasm of skin of right lower limb, including hip: Secondary | ICD-10-CM | POA: Diagnosis not present

## 2014-04-17 DIAGNOSIS — M9902 Segmental and somatic dysfunction of thoracic region: Secondary | ICD-10-CM | POA: Diagnosis not present

## 2014-04-17 DIAGNOSIS — M79672 Pain in left foot: Secondary | ICD-10-CM | POA: Diagnosis not present

## 2014-04-17 DIAGNOSIS — M7672 Peroneal tendinitis, left leg: Secondary | ICD-10-CM | POA: Diagnosis not present

## 2014-04-17 DIAGNOSIS — M9901 Segmental and somatic dysfunction of cervical region: Secondary | ICD-10-CM | POA: Diagnosis not present

## 2014-04-17 DIAGNOSIS — M9903 Segmental and somatic dysfunction of lumbar region: Secondary | ICD-10-CM | POA: Diagnosis not present

## 2014-04-17 DIAGNOSIS — M65872 Other synovitis and tenosynovitis, left ankle and foot: Secondary | ICD-10-CM | POA: Diagnosis not present

## 2014-04-17 MED ORDER — HYDROCORTISONE ACETATE 25 MG RE SUPP: 25.0000 mg | Freq: Two times a day (BID) | RECTAL | Status: DC

## 2014-04-17 NOTE — Telephone Encounter (Signed)
David Armstrong,   Fax from [facility] request for refill on [employer]. Is it okay to refill?

## 2014-04-17 NOTE — Telephone Encounter (Signed)
yes

## 2014-04-17 NOTE — Telephone Encounter (Signed)
Thank you :)

## 2014-04-18 DIAGNOSIS — L820 Inflamed seborrheic keratosis: Secondary | ICD-10-CM | POA: Diagnosis not present

## 2014-04-19 ENCOUNTER — Encounter: Payer: Self-pay | Admitting: Family Medicine

## 2014-04-19 DIAGNOSIS — N200 Calculus of kidney: Secondary | ICD-10-CM | POA: Insufficient documentation

## 2014-04-20 DIAGNOSIS — M5137 Other intervertebral disc degeneration, lumbosacral region: Secondary | ICD-10-CM | POA: Diagnosis not present

## 2014-04-20 DIAGNOSIS — M9903 Segmental and somatic dysfunction of lumbar region: Secondary | ICD-10-CM | POA: Diagnosis not present

## 2014-04-20 DIAGNOSIS — M9901 Segmental and somatic dysfunction of cervical region: Secondary | ICD-10-CM | POA: Diagnosis not present

## 2014-04-20 DIAGNOSIS — M9902 Segmental and somatic dysfunction of thoracic region: Secondary | ICD-10-CM | POA: Diagnosis not present

## 2014-04-24 DIAGNOSIS — M9902 Segmental and somatic dysfunction of thoracic region: Secondary | ICD-10-CM | POA: Diagnosis not present

## 2014-04-24 DIAGNOSIS — M9901 Segmental and somatic dysfunction of cervical region: Secondary | ICD-10-CM | POA: Diagnosis not present

## 2014-04-24 DIAGNOSIS — D2371 Other benign neoplasm of skin of right lower limb, including hip: Secondary | ICD-10-CM | POA: Diagnosis not present

## 2014-04-24 DIAGNOSIS — M9903 Segmental and somatic dysfunction of lumbar region: Secondary | ICD-10-CM | POA: Diagnosis not present

## 2014-04-24 DIAGNOSIS — M7672 Peroneal tendinitis, left leg: Secondary | ICD-10-CM | POA: Diagnosis not present

## 2014-04-24 DIAGNOSIS — M5137 Other intervertebral disc degeneration, lumbosacral region: Secondary | ICD-10-CM | POA: Diagnosis not present

## 2014-04-24 DIAGNOSIS — M65872 Other synovitis and tenosynovitis, left ankle and foot: Secondary | ICD-10-CM | POA: Diagnosis not present

## 2014-04-24 DIAGNOSIS — B079 Viral wart, unspecified: Secondary | ICD-10-CM | POA: Diagnosis not present

## 2014-04-26 DIAGNOSIS — M5137 Other intervertebral disc degeneration, lumbosacral region: Secondary | ICD-10-CM | POA: Diagnosis not present

## 2014-04-26 DIAGNOSIS — M9903 Segmental and somatic dysfunction of lumbar region: Secondary | ICD-10-CM | POA: Diagnosis not present

## 2014-04-26 DIAGNOSIS — M9902 Segmental and somatic dysfunction of thoracic region: Secondary | ICD-10-CM | POA: Diagnosis not present

## 2014-04-26 DIAGNOSIS — M9901 Segmental and somatic dysfunction of cervical region: Secondary | ICD-10-CM | POA: Diagnosis not present

## 2014-05-01 DIAGNOSIS — M9901 Segmental and somatic dysfunction of cervical region: Secondary | ICD-10-CM | POA: Diagnosis not present

## 2014-05-01 DIAGNOSIS — M9902 Segmental and somatic dysfunction of thoracic region: Secondary | ICD-10-CM | POA: Diagnosis not present

## 2014-05-01 DIAGNOSIS — M5137 Other intervertebral disc degeneration, lumbosacral region: Secondary | ICD-10-CM | POA: Diagnosis not present

## 2014-05-01 DIAGNOSIS — M9903 Segmental and somatic dysfunction of lumbar region: Secondary | ICD-10-CM | POA: Diagnosis not present

## 2014-05-03 DIAGNOSIS — M9903 Segmental and somatic dysfunction of lumbar region: Secondary | ICD-10-CM | POA: Diagnosis not present

## 2014-05-03 DIAGNOSIS — M5137 Other intervertebral disc degeneration, lumbosacral region: Secondary | ICD-10-CM | POA: Diagnosis not present

## 2014-05-03 DIAGNOSIS — M9902 Segmental and somatic dysfunction of thoracic region: Secondary | ICD-10-CM | POA: Diagnosis not present

## 2014-05-03 DIAGNOSIS — M9901 Segmental and somatic dysfunction of cervical region: Secondary | ICD-10-CM | POA: Diagnosis not present

## 2014-05-04 DIAGNOSIS — M9901 Segmental and somatic dysfunction of cervical region: Secondary | ICD-10-CM | POA: Diagnosis not present

## 2014-05-04 DIAGNOSIS — M9903 Segmental and somatic dysfunction of lumbar region: Secondary | ICD-10-CM | POA: Diagnosis not present

## 2014-05-04 DIAGNOSIS — M5137 Other intervertebral disc degeneration, lumbosacral region: Secondary | ICD-10-CM | POA: Diagnosis not present

## 2014-05-04 DIAGNOSIS — M9902 Segmental and somatic dysfunction of thoracic region: Secondary | ICD-10-CM | POA: Diagnosis not present

## 2014-05-10 ENCOUNTER — Encounter: Payer: Self-pay | Admitting: Internal Medicine

## 2014-05-10 DIAGNOSIS — M65872 Other synovitis and tenosynovitis, left ankle and foot: Secondary | ICD-10-CM | POA: Diagnosis not present

## 2014-05-10 DIAGNOSIS — B079 Viral wart, unspecified: Secondary | ICD-10-CM | POA: Diagnosis not present

## 2014-05-10 DIAGNOSIS — M5137 Other intervertebral disc degeneration, lumbosacral region: Secondary | ICD-10-CM | POA: Diagnosis not present

## 2014-05-10 DIAGNOSIS — M9901 Segmental and somatic dysfunction of cervical region: Secondary | ICD-10-CM | POA: Diagnosis not present

## 2014-05-10 DIAGNOSIS — M792 Neuralgia and neuritis, unspecified: Secondary | ICD-10-CM | POA: Diagnosis not present

## 2014-05-10 DIAGNOSIS — M9902 Segmental and somatic dysfunction of thoracic region: Secondary | ICD-10-CM | POA: Diagnosis not present

## 2014-05-10 DIAGNOSIS — M9903 Segmental and somatic dysfunction of lumbar region: Secondary | ICD-10-CM | POA: Diagnosis not present

## 2014-05-10 DIAGNOSIS — M25572 Pain in left ankle and joints of left foot: Secondary | ICD-10-CM | POA: Diagnosis not present

## 2014-05-11 DIAGNOSIS — M9902 Segmental and somatic dysfunction of thoracic region: Secondary | ICD-10-CM | POA: Diagnosis not present

## 2014-05-11 DIAGNOSIS — M5137 Other intervertebral disc degeneration, lumbosacral region: Secondary | ICD-10-CM | POA: Diagnosis not present

## 2014-05-11 DIAGNOSIS — M9901 Segmental and somatic dysfunction of cervical region: Secondary | ICD-10-CM | POA: Diagnosis not present

## 2014-05-11 DIAGNOSIS — M9903 Segmental and somatic dysfunction of lumbar region: Secondary | ICD-10-CM | POA: Diagnosis not present

## 2014-05-15 DIAGNOSIS — M9901 Segmental and somatic dysfunction of cervical region: Secondary | ICD-10-CM | POA: Diagnosis not present

## 2014-05-15 DIAGNOSIS — M5137 Other intervertebral disc degeneration, lumbosacral region: Secondary | ICD-10-CM | POA: Diagnosis not present

## 2014-05-15 DIAGNOSIS — M9903 Segmental and somatic dysfunction of lumbar region: Secondary | ICD-10-CM | POA: Diagnosis not present

## 2014-05-15 DIAGNOSIS — M9902 Segmental and somatic dysfunction of thoracic region: Secondary | ICD-10-CM | POA: Diagnosis not present

## 2014-05-18 DIAGNOSIS — M9902 Segmental and somatic dysfunction of thoracic region: Secondary | ICD-10-CM | POA: Diagnosis not present

## 2014-05-18 DIAGNOSIS — M9903 Segmental and somatic dysfunction of lumbar region: Secondary | ICD-10-CM | POA: Diagnosis not present

## 2014-05-18 DIAGNOSIS — M5137 Other intervertebral disc degeneration, lumbosacral region: Secondary | ICD-10-CM | POA: Diagnosis not present

## 2014-05-18 DIAGNOSIS — M9901 Segmental and somatic dysfunction of cervical region: Secondary | ICD-10-CM | POA: Diagnosis not present

## 2014-05-22 DIAGNOSIS — M9903 Segmental and somatic dysfunction of lumbar region: Secondary | ICD-10-CM | POA: Diagnosis not present

## 2014-05-22 DIAGNOSIS — M5137 Other intervertebral disc degeneration, lumbosacral region: Secondary | ICD-10-CM | POA: Diagnosis not present

## 2014-05-22 DIAGNOSIS — M9901 Segmental and somatic dysfunction of cervical region: Secondary | ICD-10-CM | POA: Diagnosis not present

## 2014-05-22 DIAGNOSIS — M9902 Segmental and somatic dysfunction of thoracic region: Secondary | ICD-10-CM | POA: Diagnosis not present

## 2014-05-24 DIAGNOSIS — M7752 Other enthesopathy of left foot: Secondary | ICD-10-CM | POA: Diagnosis not present

## 2014-05-24 DIAGNOSIS — M7672 Peroneal tendinitis, left leg: Secondary | ICD-10-CM | POA: Diagnosis not present

## 2014-05-24 DIAGNOSIS — M25572 Pain in left ankle and joints of left foot: Secondary | ICD-10-CM | POA: Diagnosis not present

## 2014-05-24 DIAGNOSIS — B079 Viral wart, unspecified: Secondary | ICD-10-CM | POA: Diagnosis not present

## 2014-05-30 ENCOUNTER — Encounter: Payer: Self-pay | Admitting: Family Medicine

## 2014-05-30 ENCOUNTER — Ambulatory Visit (INDEPENDENT_AMBULATORY_CARE_PROVIDER_SITE_OTHER): Payer: Medicare Other | Admitting: Family Medicine

## 2014-05-30 VITALS — BP 125/83 | HR 80 | Wt 173.0 lb

## 2014-05-30 DIAGNOSIS — M9901 Segmental and somatic dysfunction of cervical region: Secondary | ICD-10-CM | POA: Diagnosis not present

## 2014-05-30 DIAGNOSIS — M9903 Segmental and somatic dysfunction of lumbar region: Secondary | ICD-10-CM | POA: Diagnosis not present

## 2014-05-30 DIAGNOSIS — S90424A Blister (nonthermal), right lesser toe(s), initial encounter: Secondary | ICD-10-CM

## 2014-05-30 DIAGNOSIS — M5137 Other intervertebral disc degeneration, lumbosacral region: Secondary | ICD-10-CM | POA: Diagnosis not present

## 2014-05-30 DIAGNOSIS — L0390 Cellulitis, unspecified: Secondary | ICD-10-CM

## 2014-05-30 DIAGNOSIS — M9902 Segmental and somatic dysfunction of thoracic region: Secondary | ICD-10-CM | POA: Diagnosis not present

## 2014-05-30 MED ORDER — CEPHALEXIN 500 MG PO CAPS: 500.0000 mg | ORAL_CAPSULE | Freq: Three times a day (TID) | ORAL | Status: DC

## 2014-05-30 NOTE — Progress Notes (Signed)
CC: David Armstrong is a 64 y.o. male is here for infection on big toe   Subjective: HPI:  Patient complains of pain and swelling on his right great toe on the medial aspect that has been present for the past 3 or 4 days. Over the past 2-3 weeks he has been seen a podiatrist for treatment of a suspected wart. On a weekly basis he will go into the office stable shave down some callus and then place a ointment on it that he is supposed to leave on therefore at least 2 days. Treatment has been ineffective and painless up until 3 days ago when he began to have pain with light touch, weightbearing, and even with resting without any pressure applied to the site. He states he feels like he is in his regular state of health otherwise. He's had some redness surrounding the site of pain. No interventions as of yet other than above. Denies fevers, chills, nor joint pain elsewhere.    Review Of Systems Outlined In HPI  Past Medical History  Diagnosis Date  . ANXIETY   . Personal history of colonic polyps 10/27/2007    HYPERPLASTIC POLYP  . DEPRESSION   . DIVERTICULOSIS, COLON [phone]    Colonoscopy  . GERD   . HYPERLIPIDEMIA   . RENAL CALCULUS   . IBS (irritable bowel syndrome)   . Hiatal hernia [id number]    EGD  . H. pylori infection   . Arthritis   . Anal fissure   . TMJ (temporomandibular joint syndrome)   . Unspecified gastritis and gastroduodenitis without mention of hemorrhage 2009    EGD  . Hemorrhoids [id number]    Colonoscopy   . Peripheral neuropathy     Past Surgical History  Procedure Laterality Date  . Knee surgery Left   . Shoulder surgery Bilateral 07/2005, 03/2011    left x 2  . Heel spur surgery Bilateral   . Hammer toe surgery Left   . Lithotripsy    . 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 Partial knee arthroplasty Right   . Foot neuroma surgery Right   . Eye lid surgery Bilateral 01/2014   Family History  Problem Relation Age of Onset  . Heart disease  Mother   . Stroke Mother   . Cirrhosis Father   . Heart disease Father   . Skin cancer Father   . Colon cancer Neg Hx   . Prostate cancer Maternal Grandfather   . Parkinson's disease Paternal Grandfather   . Heart disease Brother     History   Social History  . Marital Status: Single    Spouse Name: N/A  . Number of Children: 0  . Years of Education: N/A   Occupational History  . retired [employer]    disabled now   Social History Main Topics  . Smoking status: Never Smoker   . Smokeless tobacco: Never Used  . Alcohol Use: No  . Drug Use: No  . Sexual Activity: Not on file   Other Topics Concern  . Not on file   Social History Narrative     Objective: BP 125/83 mmHg  Pulse 80  Wt 173 lb (78.472 kg)  SpO2 97%  Vital signs reviewed. General: Alert and Oriented, No Acute Distress HEENT: Pupils equal, round, reactive to light. Conjunctivae clear.  External ears unremarkable.  Moist mucous membranes. Lungs: Clear and comfortable work of breathing, speaking in full sentences without accessory muscle use. Cardiac: Regular rate and rhythm.  Neuro: CN II-XII grossly intact, gait normal. Extremities: No peripheral edema.  Strong peripheral pulses. 1/2 cm diameter fluid filled blister on the medial aspect of the right great toe with mild surrounding erythema. Tender to the touch but no warmth. Mental Status: No depression, anxiety, nor agitation. Logical though process. Skin: Warm and dry.  Assessment & Plan: David Armstrong was seen today for infection on big toe.  Diagnoses and all orders for this visit:  Cellulitis, unspecified cellulitis site, unspecified extremity site, unspecified laterality Orders: -     cephALEXin (KEFLEX) 500 MG capsule; Take 1 capsule (500 mg total) by mouth 3 (three) times daily.  Blister of toe, right, initial encounter Orders: -     cephALEXin (KEFLEX) 500 MG capsule; Take 1 capsule (500 mg total) by mouth 3 (three) times daily.    very  mild cellulitis surrounding a blister on the right great toe, discussed with patient that this is a normal and expected response to treatment of warts, it shows that the treatment is so far successful however, located by cellulitis and pain. With his permission his toe was cleaned with chlorhexidine and while anesthetizing the toe was topical aerosol cold spray a scalpel was used to lance the blister in 2 different spots,  a mild amount of clear fluid that came out of the blister. I bandaged toe. I advised him to soak this foot in a warm Epsom salt bath for 15 minutes 3 times a day for the next 5 days.  Begin Keflex and keep blister covered when not soaking.   Return if symptoms worsen or fail to improve.

## 2014-05-30 NOTE — Patient Instructions (Signed)
Soak your foot in a warm epsom salt bath three times a day for at least 15 minutes at a time for at least five days.   Begin the antibiotic that was sent to your pharmacy.

## 2014-05-31 DIAGNOSIS — M25572 Pain in left ankle and joints of left foot: Secondary | ICD-10-CM | POA: Diagnosis not present

## 2014-05-31 DIAGNOSIS — M7752 Other enthesopathy of left foot: Secondary | ICD-10-CM | POA: Diagnosis not present

## 2014-06-01 DIAGNOSIS — M9902 Segmental and somatic dysfunction of thoracic region: Secondary | ICD-10-CM | POA: Diagnosis not present

## 2014-06-01 DIAGNOSIS — M9903 Segmental and somatic dysfunction of lumbar region: Secondary | ICD-10-CM | POA: Diagnosis not present

## 2014-06-01 DIAGNOSIS — M9901 Segmental and somatic dysfunction of cervical region: Secondary | ICD-10-CM | POA: Diagnosis not present

## 2014-06-01 DIAGNOSIS — M5137 Other intervertebral disc degeneration, lumbosacral region: Secondary | ICD-10-CM | POA: Diagnosis not present

## 2014-06-05 DIAGNOSIS — M9901 Segmental and somatic dysfunction of cervical region: Secondary | ICD-10-CM | POA: Diagnosis not present

## 2014-06-05 DIAGNOSIS — M9902 Segmental and somatic dysfunction of thoracic region: Secondary | ICD-10-CM | POA: Diagnosis not present

## 2014-06-05 DIAGNOSIS — M5137 Other intervertebral disc degeneration, lumbosacral region: Secondary | ICD-10-CM | POA: Diagnosis not present

## 2014-06-05 DIAGNOSIS — M9903 Segmental and somatic dysfunction of lumbar region: Secondary | ICD-10-CM | POA: Diagnosis not present

## 2014-06-12 DIAGNOSIS — M9903 Segmental and somatic dysfunction of lumbar region: Secondary | ICD-10-CM | POA: Diagnosis not present

## 2014-06-12 DIAGNOSIS — M5430 Sciatica, unspecified side: Secondary | ICD-10-CM | POA: Diagnosis not present

## 2014-06-12 DIAGNOSIS — M9904 Segmental and somatic dysfunction of sacral region: Secondary | ICD-10-CM | POA: Diagnosis not present

## 2014-06-12 DIAGNOSIS — M9902 Segmental and somatic dysfunction of thoracic region: Secondary | ICD-10-CM | POA: Diagnosis not present

## 2014-06-19 DIAGNOSIS — M9904 Segmental and somatic dysfunction of sacral region: Secondary | ICD-10-CM | POA: Diagnosis not present

## 2014-06-19 DIAGNOSIS — M9902 Segmental and somatic dysfunction of thoracic region: Secondary | ICD-10-CM | POA: Diagnosis not present

## 2014-06-19 DIAGNOSIS — M5430 Sciatica, unspecified side: Secondary | ICD-10-CM | POA: Diagnosis not present

## 2014-06-19 DIAGNOSIS — M9903 Segmental and somatic dysfunction of lumbar region: Secondary | ICD-10-CM | POA: Diagnosis not present

## 2014-06-22 ENCOUNTER — Encounter: Payer: Self-pay | Admitting: *Deleted

## 2014-06-26 ENCOUNTER — Encounter: Payer: Self-pay | Admitting: Family Medicine

## 2014-06-27 DIAGNOSIS — M25562 Pain in left knee: Secondary | ICD-10-CM | POA: Diagnosis not present

## 2014-06-27 DIAGNOSIS — M9902 Segmental and somatic dysfunction of thoracic region: Secondary | ICD-10-CM | POA: Diagnosis not present

## 2014-06-27 DIAGNOSIS — M9903 Segmental and somatic dysfunction of lumbar region: Secondary | ICD-10-CM | POA: Diagnosis not present

## 2014-06-27 DIAGNOSIS — M9904 Segmental and somatic dysfunction of sacral region: Secondary | ICD-10-CM | POA: Diagnosis not present

## 2014-06-27 DIAGNOSIS — M5430 Sciatica, unspecified side: Secondary | ICD-10-CM | POA: Diagnosis not present

## 2014-06-28 DIAGNOSIS — M25562 Pain in left knee: Secondary | ICD-10-CM | POA: Diagnosis not present

## 2014-06-28 DIAGNOSIS — M5430 Sciatica, unspecified side: Secondary | ICD-10-CM | POA: Diagnosis not present

## 2014-06-28 DIAGNOSIS — B079 Viral wart, unspecified: Secondary | ICD-10-CM | POA: Diagnosis not present

## 2014-06-28 DIAGNOSIS — M9902 Segmental and somatic dysfunction of thoracic region: Secondary | ICD-10-CM | POA: Diagnosis not present

## 2014-06-28 DIAGNOSIS — M9904 Segmental and somatic dysfunction of sacral region: Secondary | ICD-10-CM | POA: Diagnosis not present

## 2014-06-28 DIAGNOSIS — M9903 Segmental and somatic dysfunction of lumbar region: Secondary | ICD-10-CM | POA: Diagnosis not present

## 2014-06-28 DIAGNOSIS — M7751 Other enthesopathy of right foot: Secondary | ICD-10-CM | POA: Diagnosis not present

## 2014-06-28 DIAGNOSIS — M12271 Villonodular synovitis (pigmented), right ankle and foot: Secondary | ICD-10-CM | POA: Diagnosis not present

## 2014-06-29 DIAGNOSIS — M5417 Radiculopathy, lumbosacral region: Secondary | ICD-10-CM | POA: Diagnosis not present

## 2014-06-29 DIAGNOSIS — G5602 Carpal tunnel syndrome, left upper limb: Secondary | ICD-10-CM | POA: Diagnosis not present

## 2014-06-29 DIAGNOSIS — G603 Idiopathic progressive neuropathy: Secondary | ICD-10-CM | POA: Diagnosis not present

## 2014-06-29 DIAGNOSIS — G5601 Carpal tunnel syndrome, right upper limb: Secondary | ICD-10-CM | POA: Diagnosis not present

## 2014-06-29 DIAGNOSIS — M542 Cervicalgia: Secondary | ICD-10-CM | POA: Diagnosis not present

## 2014-07-05 ENCOUNTER — Encounter: Payer: Self-pay | Admitting: Internal Medicine

## 2014-07-05 ENCOUNTER — Ambulatory Visit (INDEPENDENT_AMBULATORY_CARE_PROVIDER_SITE_OTHER): Payer: Medicare Other | Admitting: Internal Medicine

## 2014-07-05 ENCOUNTER — Telehealth: Payer: Self-pay | Admitting: Internal Medicine

## 2014-07-05 VITALS — BP 100/68 | HR 88 | Ht 70.25 in | Wt 172.2 lb

## 2014-07-05 DIAGNOSIS — K5909 Other constipation: Secondary | ICD-10-CM | POA: Diagnosis not present

## 2014-07-05 DIAGNOSIS — K589 Irritable bowel syndrome without diarrhea: Secondary | ICD-10-CM

## 2014-07-05 MED ORDER — LINACLOTIDE 145 MCG PO CAPS: 145.0000 ug | ORAL_CAPSULE | Freq: Every day | ORAL | Status: DC

## 2014-07-05 NOTE — Patient Instructions (Signed)
We have sent the following medications to your pharmacy for you to pick up at your convenience: Linzess 145 mcg daily until follow up  Please follow up with Dr Hilarie Fredrickson in 3 months.

## 2014-07-05 NOTE — Telephone Encounter (Signed)
Medication needed prior authorization. I have initiated prior authorization via [email or web address] and am awaiting an additional questionaire. Patient advised.

## 2014-07-05 NOTE — Progress Notes (Signed)
Subjective:    Patient ID: DONJUAN ROBISON, male    DOB: 1951-02-07, 64 y.o.   MRN: [id number]  HPI Courtenay Creger is a 64 year old male previously known and followed by Dr. Sharlett Iles with a past medical history of diverticulosis, GERD, IBS, small hiatal hernia, H. pylori status post treatment, hyperlipidemia, anxiety and depression who is seen in follow-up. He was last in the office on 04/04/2014 and seen by [employer], PA-C. At this visit he was having symptoms consistent with irritable bowel along with gas, bloating and borborygmi. He was treated with rifaximin 550 mg 3 times daily for 2 weeks. He also was having. Anal irritation with internal hemorrhoids and advised to use Anusol suppositories. He reports the rifaximin helped with his gas and bloating. Of late he's been having more trouble with constipation. Constipation has been an on and off problem for many years. He reports it's been weeks since he's had a good bowel movement though he has been able to pass hard stools with the require straining. He denies blood in his stool or melena. He has tried Dulcolax suppositories without much benefit. He denies abdominal pain. He denies heartburn, dysphagia or odynophagia. Reports good appetite and stable weight.   Review of Systems As per history of present illness, otherwise negative  Current Medications, Allergies, Past Medical History, Past Surgical History, Family History and Social History were reviewed in [employer] record.     Objective:   Physical Exam BP 100/68 mmHg  Pulse 88  Ht 5' 10.25" (1.784 m)  Wt 172 lb 4 oz (78.132 kg)  BMI 24.55 kg/m2 Constitutional: Well-developed and well-nourished. No distress. HEENT: Normocephalic and atraumatic. Oropharynx is clear and moist. No oropharyngeal exudate. Conjunctivae are normal.  No scleral icterus. Neck: Neck supple. Trachea midline. Cardiovascular: Normal rate, regular rhythm and intact distal pulses. No  M/R/G Pulmonary/chest: Effort normal and breath sounds normal. No wheezing, rales or rhonchi. Abdominal: Soft, nontender, nondistended. Bowel sounds active throughout. There are no masses palpable. No hepatosplenomegaly. Extremities: no clubbing, cyanosis, or edema Lymphadenopathy: No cervical adenopathy noted. Neurological: Alert and oriented to person place and time. Skin: Skin is warm and dry. No rashes noted. Psychiatric: Normal mood and affect. Behavior is normal.  CBC    Component Value Date/Time   WBC 7.3 03/14/2014 1531   RBC 4.91 03/14/2014 1531   HGB 14.7 03/14/2014 1531   HCT 43.7 03/14/2014 1531   PLT 206.0 03/14/2014 1531   MCV 89.1 03/14/2014 1531   MCHC 33.5 03/14/2014 1531   RDW 14.0 03/14/2014 1531   LYMPHSABS 1.7 03/14/2014 1531   MONOABS 0.7 03/14/2014 1531   EOSABS 0.4 03/14/2014 1531   BASOSABS 0.0 03/14/2014 1531    CMP     Component Value Date/Time   NA 139 03/14/2014 1531   K 4.8 03/14/2014 1531   CL 106 03/14/2014 1531   CO2 28 03/14/2014 1531   GLUCOSE 80 03/14/2014 1531   BUN 15 03/14/2014 1531   CREATININE 1.0 03/14/2014 1531   CREATININE 0.81 02/01/2014 0906   CALCIUM 8.7 03/14/2014 1531   PROT 6.5 03/14/2014 1531   ALBUMIN 3.8 03/14/2014 1531   AST 21 03/14/2014 1531   ALT 19 03/14/2014 1531   ALKPHOS 91 03/14/2014 1531   BILITOT 0.7 03/14/2014 1531   GFRNONAA >89 02/01/2014 0906   GFRNONAA 89.60 11/23/2009 0950   GFRAA >89 02/01/2014 0906   GFRAA 112 09/03/2007 1154    Colonoscopy 03/02/2013, Dr. Sharlett Iles -- mild diverticulosis in  the sigmoid, colonoscopy otherwise normal Upper endoscopy 03/02/2013, Dr. Sharlett Iles -- normal esophageal mucosa, small hiatal hernia. No Barrett's noted. Impaired dilation [address] [place] [address]. Normal stomach. Normal duodenum. Duodenal biopsies normal      Assessment & Plan:   64 year old male previously known and followed by Dr. Sharlett Iles with a past medical history of diverticulosis, GERD, IBS, small  hiatal hernia, H. pylori status post treatment, hyperlipidemia, anxiety and depression who is seen in follow-up.  1. IBS with constipation -- improvement in gas, bloating and borborygmi with rifaximin. Query small intestinal bacterial overgrowth versus improvement in IBS. For his constipation, I recommended Linzess 145 g daily. We discussed the risks including diarrhea and he is asked to notify me if this occurs. He will return in 3 months for reassessment, sooner if necessary  2. Internal hemorrhoids -- not currently symptomatic. Likely will improve and reduce flares we can control constipation and overall bowel health

## 2014-07-06 NOTE — Telephone Encounter (Signed)
Patient's linzess has been approved from [facility] from 07/06/14-07/05/17. PA# [id number] (mail handlers benefit plan).

## 2014-07-19 DIAGNOSIS — M65871 Other synovitis and tenosynovitis, right ankle and foot: Secondary | ICD-10-CM | POA: Diagnosis not present

## 2014-07-19 DIAGNOSIS — M12271 Villonodular synovitis (pigmented), right ankle and foot: Secondary | ICD-10-CM | POA: Diagnosis not present

## 2014-07-31 DIAGNOSIS — H02403 Unspecified ptosis of bilateral eyelids: Secondary | ICD-10-CM | POA: Diagnosis not present

## 2014-07-31 DIAGNOSIS — H53483 Generalized contraction of visual field, bilateral: Secondary | ICD-10-CM | POA: Diagnosis not present

## 2014-07-31 DIAGNOSIS — H11433 Conjunctival hyperemia, bilateral: Secondary | ICD-10-CM | POA: Diagnosis not present

## 2014-08-02 DIAGNOSIS — M531 Cervicobrachial syndrome: Secondary | ICD-10-CM | POA: Diagnosis not present

## 2014-08-02 DIAGNOSIS — M9904 Segmental and somatic dysfunction of sacral region: Secondary | ICD-10-CM | POA: Diagnosis not present

## 2014-08-02 DIAGNOSIS — M9901 Segmental and somatic dysfunction of cervical region: Secondary | ICD-10-CM | POA: Diagnosis not present

## 2014-08-02 DIAGNOSIS — M25562 Pain in left knee: Secondary | ICD-10-CM | POA: Diagnosis not present

## 2014-08-02 DIAGNOSIS — M9905 Segmental and somatic dysfunction of pelvic region: Secondary | ICD-10-CM | POA: Diagnosis not present

## 2014-08-02 DIAGNOSIS — M9903 Segmental and somatic dysfunction of lumbar region: Secondary | ICD-10-CM | POA: Diagnosis not present

## 2014-08-02 DIAGNOSIS — M9902 Segmental and somatic dysfunction of thoracic region: Secondary | ICD-10-CM | POA: Diagnosis not present

## 2014-08-03 DIAGNOSIS — M9902 Segmental and somatic dysfunction of thoracic region: Secondary | ICD-10-CM | POA: Diagnosis not present

## 2014-08-03 DIAGNOSIS — M9903 Segmental and somatic dysfunction of lumbar region: Secondary | ICD-10-CM | POA: Diagnosis not present

## 2014-08-03 DIAGNOSIS — M9905 Segmental and somatic dysfunction of pelvic region: Secondary | ICD-10-CM | POA: Diagnosis not present

## 2014-08-03 DIAGNOSIS — M9901 Segmental and somatic dysfunction of cervical region: Secondary | ICD-10-CM | POA: Diagnosis not present

## 2014-08-03 DIAGNOSIS — M9904 Segmental and somatic dysfunction of sacral region: Secondary | ICD-10-CM | POA: Diagnosis not present

## 2014-08-03 DIAGNOSIS — M25562 Pain in left knee: Secondary | ICD-10-CM | POA: Diagnosis not present

## 2014-08-03 DIAGNOSIS — M531 Cervicobrachial syndrome: Secondary | ICD-10-CM | POA: Diagnosis not present

## 2014-08-07 ENCOUNTER — Encounter: Payer: Self-pay | Admitting: Family Medicine

## 2014-08-07 ENCOUNTER — Ambulatory Visit (INDEPENDENT_AMBULATORY_CARE_PROVIDER_SITE_OTHER): Payer: Medicare Other | Admitting: Family Medicine

## 2014-08-07 VITALS — BP 126/83 | HR 78 | Ht 71.0 in | Wt 175.0 lb

## 2014-08-07 DIAGNOSIS — M542 Cervicalgia: Secondary | ICD-10-CM | POA: Diagnosis not present

## 2014-08-07 DIAGNOSIS — Z0000 Encounter for general adult medical examination without abnormal findings: Secondary | ICD-10-CM

## 2014-08-07 MED ORDER — TIZANIDINE HCL 4 MG PO TABS: 4.0000 mg | ORAL_TABLET | Freq: Four times a day (QID) | ORAL | Status: DC | PRN

## 2014-08-07 NOTE — Progress Notes (Signed)
Subjective:    David Armstrong is a 64 y.o. male who presents for Medicare Annual/Subsequent preventive examination.   Preventive Screening-Counseling & Management  Tobacco History  Smoking status  . Never Smoker   Smokeless tobacco  . Never Used   Colonoscopy: Repeat 2019 Prostate: Discussed screening risks/beneifts with patient today, opts for PSA  Influenza Vaccine: not indicated Pneumovax: not indicated Td/Tdap: UTD Zoster: UTD    Problems Prior to Visit 1. Posterior neck pain, stiffness.  HLD: open to the idea of starting crestor if needed  Current Problems (verified) Patient Active Problem List   Diagnosis Date Noted  . Nephrolithiasis 04/19/2014  . Peripheral neuropathy 12/15/2013  . Thrombosed external hemorrhoid s/p I&D [id number] 06/09/2012  . 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 IBS (irritable bowel syndrome) 03/09/2012  . Abdominal pain 05/19/2011  . 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 ABDOMINAL PAIN 11/23/2009  . GERD 10/26/2007  . DIVERTICULOSIS, COLON 10/25/2007  . RENAL CALCULUS 10/25/2007  . HEMORRHOIDS 11/24/2006  . HYPERLIPIDEMIA 11/23/2006  . ANXIETY 11/23/2006  . DEPRESSION 11/23/2006  . COLONIC POLYPS, HX OF 11/23/2006    Medications Prior to Visit Current Outpatient Prescriptions on File Prior to Visit  Medication Sig Dispense Refill  . LORazepam (ATIVAN) 1 MG tablet Take 1 mg by mouth every 8 (eight) hours.     . Linaclotide (LINZESS) 145 MCG CAPS capsule Take 1 capsule (145 mcg total) by mouth daily. (Patient not taking: Reported on 08/07/2014) 30 capsule 2   No current facility-administered medications on file prior to visit.    Current Medications (verified) Current Outpatient Prescriptions  Medication Sig Dispense Refill  . LORazepam (ATIVAN) 1 MG tablet Take 1 mg by mouth every 8 (eight) hours.     . Linaclotide (LINZESS) 145 MCG CAPS capsule Take 1 capsule (145 mcg total) by mouth daily. (Patient not taking: Reported  on 08/07/2014) 30 capsule 2   No current facility-administered medications for this visit.     Allergies (verified) Dexilant; Soma; Sulfamethoxazole; and Ivp dye   PAST HISTORY  Family History Family History  Problem Relation Age of Onset  . Heart disease Mother   . Stroke Mother   . Cirrhosis Father   . Heart disease Father   . Skin cancer Father   . Colon cancer Neg Hx   . Prostate cancer Maternal Grandfather   . Parkinson's disease Paternal Grandfather   . Heart disease Brother     Social History History  Substance Use Topics  . Smoking status: Never Smoker   . Smokeless tobacco: Never Used  . Alcohol Use: No    Are there smokers in your home (other than you)?  No  Risk Factors Current exercise habits: Home exercise routine includes walking.  Dietary issues discussed: DASH   Cardiac risk factors: advanced age (older than 15 for men, 45 for women).  Depression Screen (Note: if answer to either of the following is "Yes", a more complete depression screening is indicated)   Q1: Over the past two weeks, have you felt down, depressed or hopeless? No  Q2: Over the past two weeks, have you felt little interest or pleasure in doing things? No  Have you lost interest or pleasure in daily life? No  Do you often feel hopeless? No  Do you cry easily over simple problems? No  Activities of Daily Living In your present state of health, do you have any difficulty performing the following activities?:  Driving? No Managing money?  No Feeding  yourself? No Getting from bed to chair? No Climbing a flight of stairs? No Preparing food and eating?: No Bathing or showering? No Getting dressed: No Getting to the toilet? No Using the toilet:No Moving around from place to place: No In the past year have you fallen or had a near fall?:No   Are you sexually active?  No  Do you have more than one partner?  No  Hearing Difficulties: No Do you often ask people to speak up or  repeat themselves? No Do you experience ringing or noises in your ears? No Do you have difficulty understanding soft or whispered voices? No   Do you feel that you have a problem with memory? No  Do you often misplace items? No  Do you feel safe at home?  No  Cognitive Testing  Alert? Yes  Normal Appearance?Yes  Oriented to person? Yes  Place? Yes   Time? Yes  Recall of three objects?  Yes  Can perform simple calculations? Yes  Displays appropriate judgment?Yes  Can read the correct time from a watch face?Yes   Advanced Directives have been discussed with the patient? Yes   List the Names of Other Physician/Practitioners you currently use: 1.  Gastroenterology Tressia Danas any recent Medical Services you may have received from other than [facility] providers in the past year (date may be approximate).  Immunization History  Administered Date(s) Administered  . Influenza Split 12/22/2012  . Influenza,inj,Quad PF,36+ Mos 01/07/2014  . Tdap 11/29/2012  . Zoster 11/29/2012    Screening Tests Health Maintenance  Topic Date Due  . HIV Screening  03/[birth year]/1966  . INFLUENZA VACCINE  11/13/2014  . TETANUS/TDAP  11/30/2022  . COLONOSCOPY  03/03/2023  . ZOSTAVAX  Completed    All answers were reviewed with the patient and necessary referrals were made:  Marcial Pacas, DO   08/07/2014   History reviewed: allergies, current medications, past family history, past medical history, past social history, past surgical history and problem list  Review of Systems  Review of Systems - General ROS: negative for - chills, fever, night sweats, weight gain or weight loss Ophthalmic ROS: negative for - decreased vision Psychological ROS: negative for - anxiety or depression ENT ROS: negative for - hearing change, nasal congestion, tinnitus or allergies Hematological and Lymphatic ROS: negative for - bleeding problems, bruising or swollen lymph nodes Breast ROS: negative Respiratory ROS: no  cough, shortness of breath, or wheezing Cardiovascular ROS: no chest pain or dyspnea on exertion Gastrointestinal ROS: no abdominal pain, change in bowel habits, or black or bloody stools Genito-Urinary ROS: negative for - genital discharge, genital ulcers, incontinence or abnormal bleeding from genitals Musculoskeletal ROS: negative for - joint pain or muscle pain other than that described above Neurological ROS: negative for - headaches or memory loss Dermatological ROS: negative for lumps, mole changes, rash and skin lesion changes   Objective:     Vision by Snellen chart: right eye:20/30, left eye:20/30 Blood pressure 126/83, pulse 78, height 5\' 11"  (1.803 m), weight 175 lb (79.379 kg). Body mass index is 24.42 kg/(m^2).  General: No Acute Distress HEENT: Atraumatic, normocephalic, conjunctivae normal without scleral icterus.  No nasal discharge, hearing grossly intact, TMs with good landmarks bilaterally with no middle ear abnormalities, posterior pharynx clear without oral lesions. Neck: Supple, trachea midline, no cervical nor supraclavicular adenopathy. Pulmonary: Clear to auscultation bilaterally without wheezing, rhonchi, nor rales. Cardiac: Regular rate and rhythm.  No murmurs, rubs, nor gallops. No peripheral edema.  2+  peripheral pulses bilaterally. Abdomen: Bowel sounds normal.  No masses.  Non-tender without rebound.  Negative Murphy's sign. MSK: Grossly intact, no signs of weakness.  Full strength throughout upper and lower extremities.  Full ROM in upper and lower extremities.  No midline spinal tenderness. Moderately hypertonic left upper trapezius Neuro: Gait unremarkable, CN II-XII grossly intact.  C5-C6 Reflex 2/4 Bilaterally, L4 Reflex 2/4 Bilaterally.  Cerebellar function intact. Skin: No rashes. Psych: Alert and oriented to person/place/time.  Thought process normal. No anxiety/depression.     Assessment:     Muscle hypertonicity neck pain     Plan:      During the course of the visit the patient was educated and counseled about appropriate screening and preventive services including:    Prostate cancer screening  Diet review for nutrition referral? No   Patient Instructions (the written plan) was given to the patient.  Medicare Attestation I have personally reviewed: The patient's medical and social history Their use of alcohol, tobacco or illicit drugs Their current medications and supplements The patient's functional ability including ADLs,fall risks, home safety risks, cognitive, and hearing and visual impairment Diet and physical activities Evidence for depression or mood disorders  The patient's weight, height, BMI, and visual acuity have been recorded in the chart.  I have made referrals, counseling, and provided education to the patient based on review of the above and I have provided the patient with a written personalized care plan for preventive services.     Marcial Pacas, DO   08/07/2014

## 2014-08-09 DIAGNOSIS — M9902 Segmental and somatic dysfunction of thoracic region: Secondary | ICD-10-CM | POA: Diagnosis not present

## 2014-08-09 DIAGNOSIS — M9903 Segmental and somatic dysfunction of lumbar region: Secondary | ICD-10-CM | POA: Diagnosis not present

## 2014-08-09 DIAGNOSIS — M531 Cervicobrachial syndrome: Secondary | ICD-10-CM | POA: Diagnosis not present

## 2014-08-09 DIAGNOSIS — M25562 Pain in left knee: Secondary | ICD-10-CM | POA: Diagnosis not present

## 2014-08-09 DIAGNOSIS — M9905 Segmental and somatic dysfunction of pelvic region: Secondary | ICD-10-CM | POA: Diagnosis not present

## 2014-08-09 DIAGNOSIS — M9901 Segmental and somatic dysfunction of cervical region: Secondary | ICD-10-CM | POA: Diagnosis not present

## 2014-08-09 DIAGNOSIS — M9904 Segmental and somatic dysfunction of sacral region: Secondary | ICD-10-CM | POA: Diagnosis not present

## 2014-08-11 DIAGNOSIS — Z0000 Encounter for general adult medical examination without abnormal findings: Secondary | ICD-10-CM | POA: Diagnosis not present

## 2014-08-12 LAB — LIPID PANEL
CHOL/HDL RATIO: 4.4 ratio
Cholesterol: 249 mg/dL — ABNORMAL HIGH (ref 0–200)
HDL: 57 mg/dL (ref >=40)
LDL Cholesterol: 167 mg/dL — ABNORMAL HIGH (ref 0–99)
Triglycerides: 127 mg/dL (ref <150)
VLDL: 25 mg/dL (ref 0–40)

## 2014-08-12 LAB — CBC
HCT: 44 % (ref 39.0–52.0)
HEMOGLOBIN: 14.8 g/dL (ref 13.0–17.0)
MCH: 30.1 pg (ref 26.0–34.0)
MCHC: 33.6 g/dL (ref 30.0–36.0)
MCV: 89.6 fL (ref 78.0–100.0)
MPV: 9.4 fL (ref 8.6–12.4)
PLATELETS: 166 10*3/uL (ref 150–400)
RBC: 4.91 MIL/uL (ref 4.22–5.81)
RDW: 13.9 % (ref 11.5–15.5)
WBC: 5.4 10*3/uL (ref 4.0–10.5)

## 2014-08-12 LAB — COMPLETE METABOLIC PANEL WITH GFR
ALBUMIN: 3.9 g/dL (ref 3.5–5.2)
ALT: 17 U/L (ref 0–53)
AST: 18 U/L (ref 0–37)
Alkaline Phosphatase: 82 U/L (ref 39–117)
BUN: 13 mg/dL (ref 6–23)
CHLORIDE: 106 meq/L (ref 96–112)
CO2: 29 mEq/L (ref 19–32)
Calcium: 8.9 mg/dL (ref 8.4–10.5)
Creat: 0.7 mg/dL (ref 0.50–1.35)
GLUCOSE: 85 mg/dL (ref 70–99)
POTASSIUM: 4.8 meq/L (ref 3.5–5.3)
SODIUM: 142 meq/L (ref 135–145)
TOTAL PROTEIN: 6.5 g/dL (ref 6.0–8.3)
Total Bilirubin: 0.7 mg/dL (ref 0.2–1.2)

## 2014-08-12 LAB — PSA: PSA: 0.76 ng/mL (ref <=4.00)

## 2014-08-14 ENCOUNTER — Telehealth: Payer: Self-pay | Admitting: Family Medicine

## 2014-08-14 DIAGNOSIS — E785 Hyperlipidemia, unspecified: Secondary | ICD-10-CM

## 2014-08-14 MED ORDER — ATORVASTATIN CALCIUM 20 MG PO TABS: 20.0000 mg | ORAL_TABLET | Freq: Every day | ORAL | Status: DC

## 2014-08-14 NOTE — Telephone Encounter (Signed)
LEFT MESSAGE ON VM ABOUT TEST RESULTS. David Armstrong SMA

## 2014-08-14 NOTE — Telephone Encounter (Signed)
David Armstrong, Will you please let patient know that his cholesterol was significantly elevated to a degree that I would recommend he start taking a cholesterol lowering medication called atorvastatin that I've sent to his [facility].  I'd recommend he return in 3 months to have his cholesterol rechecked.    Kidney function, liver function, blood sugar, and blood cell counts were normal.  His PSA prostate test was normal and has been stable over the last five years which is very reassuring.

## 2014-08-16 DIAGNOSIS — M9902 Segmental and somatic dysfunction of thoracic region: Secondary | ICD-10-CM | POA: Diagnosis not present

## 2014-08-16 DIAGNOSIS — M531 Cervicobrachial syndrome: Secondary | ICD-10-CM | POA: Diagnosis not present

## 2014-08-16 DIAGNOSIS — M9901 Segmental and somatic dysfunction of cervical region: Secondary | ICD-10-CM | POA: Diagnosis not present

## 2014-08-16 DIAGNOSIS — M25562 Pain in left knee: Secondary | ICD-10-CM | POA: Diagnosis not present

## 2014-08-16 DIAGNOSIS — M9903 Segmental and somatic dysfunction of lumbar region: Secondary | ICD-10-CM | POA: Diagnosis not present

## 2014-08-16 DIAGNOSIS — M9904 Segmental and somatic dysfunction of sacral region: Secondary | ICD-10-CM | POA: Diagnosis not present

## 2014-08-16 DIAGNOSIS — M9905 Segmental and somatic dysfunction of pelvic region: Secondary | ICD-10-CM | POA: Diagnosis not present

## 2014-08-22 ENCOUNTER — Ambulatory Visit (INDEPENDENT_AMBULATORY_CARE_PROVIDER_SITE_OTHER): Payer: Medicare Other | Admitting: Family Medicine

## 2014-08-22 ENCOUNTER — Encounter: Payer: Self-pay | Admitting: Family Medicine

## 2014-08-22 VITALS — BP 115/78 | HR 90 | Wt 174.0 lb

## 2014-08-22 DIAGNOSIS — R5383 Other fatigue: Secondary | ICD-10-CM | POA: Diagnosis not present

## 2014-08-22 DIAGNOSIS — L723 Sebaceous cyst: Secondary | ICD-10-CM

## 2014-08-22 DIAGNOSIS — M25512 Pain in left shoulder: Secondary | ICD-10-CM

## 2014-08-22 MED ORDER — GLUCOSE 4 G PO CHEW: 1.0000 | CHEWABLE_TABLET | Freq: Once | ORAL | Status: DC

## 2014-08-22 NOTE — Progress Notes (Signed)
CC: David Armstrong is a 64 y.o. male is here for rt leg lump   Subjective: HPI:  Mass noted last week on the medial calf. It is painless and does not itch. He came across it without looking for while cleaning his legs. It does not seem to have gotten bigger or smaller since he noticed it. He does not wheeze ever had this before. No interventions as of yet. He denies any swollen lymph nodes fevers, chills or unintentional weight loss. Denies any distal swelling or ankle edema or skin changes in the right lower extremity.  Complains of left shoulder pain that has been present for matter of years. Localized the anterior shoulder radiating. Worse with lifting anything at the elbow. Symptoms are mild in severity. He has chosen to avoid anti-inflammatories to avoid any chance of gastritis. Denies any overlying swelling or skin changes in the shoulder.   Review Of Systems Outlined In HPI  Past Medical History  Diagnosis Date  . ANXIETY   . Personal history of colonic polyps 10/27/2007    HYPERPLASTIC POLYP  . DEPRESSION   . DIVERTICULOSIS, COLON [phone]    Colonoscopy  . GERD   . HYPERLIPIDEMIA   . RENAL CALCULUS   . IBS (irritable bowel syndrome)   . Hiatal hernia [phone]    EGD  . H. pylori infection   . Arthritis   . Anal fissure   . TMJ (temporomandibular joint syndrome)   . Unspecified gastritis and gastroduodenitis without mention of hemorrhage 2009    EGD  . Hemorrhoids [id number]    Colonoscopy   . Peripheral neuropathy   . Fatty liver   . Internal hemorrhoids     Past Surgical History  Procedure Laterality Date  . Knee surgery Left   . Shoulder surgery Bilateral 07/2005, 03/2011    left x 2  . Heel spur surgery Bilateral   . Hammer toe surgery Left   . Lithotripsy    . 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 Partial knee arthroplasty Right   . Foot neuroma surgery Right   . Eye lid surgery Bilateral 01/2014   Family History  Problem Relation Age of Onset   . Heart disease Mother   . Stroke Mother   . Cirrhosis Father   . Heart disease Father   . Skin cancer Father   . Colon cancer Neg Hx   . Prostate cancer Maternal Grandfather   . Parkinson's disease Paternal Grandfather   . Heart disease Brother     History   Social History  . Marital Status: Single    Spouse Name: N/A  . Number of Children: 0  . Years of Education: N/A   Occupational History  . retired [employer]    disabled now   Social History Main Topics  . Smoking status: Never Smoker   . Smokeless tobacco: Never Used  . Alcohol Use: No  . Drug Use: No  . Sexual Activity: Not on file   Other Topics Concern  . Not on file   Social History Narrative     Objective: BP 115/78 mmHg  Pulse 90  Wt 174 lb (78.926 kg)  Vital signs reviewed. General: Alert and Oriented, No Acute Distress HEENT: Pupils equal, round, reactive to light. Conjunctivae clear.  External ears unremarkable.  Moist mucous membranes. Lungs: Clear and comfortable work of breathing, speaking in full sentences without accessory muscle use. Cardiac: Regular rate and rhythm.  Neuro: CN II-XII grossly intact, gait normal. Extremities:  No peripheral edema.  Strong peripheral pulses. The subcutaneous tissue of the right medial calf there is a half centimeter diameter hypoechoic, nontender, rubbery mass without any overlying skin changes Mental Status: No depression, anxiety, nor agitation. Logical though process. Left shoulder exam reveals full range of motion and strength in all planes of motion and with individual rotator cuff testing Speed's test positive. Skin: Warm and dry.  Assessment & Plan: Keevan was seen today for rt leg lump.  Diagnoses and all orders for this visit:  Sebaceous cyst  Left shoulder pain  Other fatigue Orders: -     glucose chewable tablet 4 g; Chew 1 tablet (4 g total) by mouth once.   Reassurance provided that the mass in his leg is most likely a sebaceous  cyst and does not have to be removed however he would like it removed therefore he will follow-up with his dermatologist for consideration of removal. Left shoulder pain felt to be due to bicipital tendinitis he was provided with a home rehabilitative plan to do on a daily basis and return in one month if no better.  He was given a glucose tablet because he had not had lunch yesterday and was feeling fatigued.  25 minutes spent face-to-face during visit today of which at least 50% was counseling or coordinating care regarding: 1. Sebaceous cyst   2. Left shoulder pain   3. Other fatigue      Return if symptoms worsen or fail to improve.

## 2014-08-23 DIAGNOSIS — M9903 Segmental and somatic dysfunction of lumbar region: Secondary | ICD-10-CM | POA: Diagnosis not present

## 2014-08-23 DIAGNOSIS — M9904 Segmental and somatic dysfunction of sacral region: Secondary | ICD-10-CM | POA: Diagnosis not present

## 2014-08-23 DIAGNOSIS — M9902 Segmental and somatic dysfunction of thoracic region: Secondary | ICD-10-CM | POA: Diagnosis not present

## 2014-08-23 DIAGNOSIS — M531 Cervicobrachial syndrome: Secondary | ICD-10-CM | POA: Diagnosis not present

## 2014-08-23 DIAGNOSIS — M9901 Segmental and somatic dysfunction of cervical region: Secondary | ICD-10-CM | POA: Diagnosis not present

## 2014-08-23 DIAGNOSIS — M25562 Pain in left knee: Secondary | ICD-10-CM | POA: Diagnosis not present

## 2014-08-23 DIAGNOSIS — M9905 Segmental and somatic dysfunction of pelvic region: Secondary | ICD-10-CM | POA: Diagnosis not present

## 2014-08-28 DIAGNOSIS — M9905 Segmental and somatic dysfunction of pelvic region: Secondary | ICD-10-CM | POA: Diagnosis not present

## 2014-08-28 DIAGNOSIS — M531 Cervicobrachial syndrome: Secondary | ICD-10-CM | POA: Diagnosis not present

## 2014-08-28 DIAGNOSIS — M9904 Segmental and somatic dysfunction of sacral region: Secondary | ICD-10-CM | POA: Diagnosis not present

## 2014-08-28 DIAGNOSIS — M25562 Pain in left knee: Secondary | ICD-10-CM | POA: Diagnosis not present

## 2014-08-28 DIAGNOSIS — M9901 Segmental and somatic dysfunction of cervical region: Secondary | ICD-10-CM | POA: Diagnosis not present

## 2014-08-28 DIAGNOSIS — M9903 Segmental and somatic dysfunction of lumbar region: Secondary | ICD-10-CM | POA: Diagnosis not present

## 2014-08-28 DIAGNOSIS — M9902 Segmental and somatic dysfunction of thoracic region: Secondary | ICD-10-CM | POA: Diagnosis not present

## 2014-09-04 DIAGNOSIS — M9903 Segmental and somatic dysfunction of lumbar region: Secondary | ICD-10-CM | POA: Diagnosis not present

## 2014-09-04 DIAGNOSIS — M9902 Segmental and somatic dysfunction of thoracic region: Secondary | ICD-10-CM | POA: Diagnosis not present

## 2014-09-04 DIAGNOSIS — M9904 Segmental and somatic dysfunction of sacral region: Secondary | ICD-10-CM | POA: Diagnosis not present

## 2014-09-04 DIAGNOSIS — M25562 Pain in left knee: Secondary | ICD-10-CM | POA: Diagnosis not present

## 2014-09-04 DIAGNOSIS — M5430 Sciatica, unspecified side: Secondary | ICD-10-CM | POA: Diagnosis not present

## 2014-09-05 DIAGNOSIS — L400 Psoriasis vulgaris: Secondary | ICD-10-CM | POA: Diagnosis not present

## 2014-09-21 DIAGNOSIS — M5417 Radiculopathy, lumbosacral region: Secondary | ICD-10-CM | POA: Diagnosis not present

## 2014-09-21 DIAGNOSIS — R200 Anesthesia of skin: Secondary | ICD-10-CM | POA: Diagnosis not present

## 2014-09-21 DIAGNOSIS — M542 Cervicalgia: Secondary | ICD-10-CM | POA: Diagnosis not present

## 2014-09-21 DIAGNOSIS — M5481 Occipital neuralgia: Secondary | ICD-10-CM | POA: Diagnosis not present

## 2014-09-21 DIAGNOSIS — G603 Idiopathic progressive neuropathy: Secondary | ICD-10-CM | POA: Diagnosis not present

## 2014-09-26 DIAGNOSIS — L539 Erythematous condition, unspecified: Secondary | ICD-10-CM | POA: Diagnosis not present

## 2014-10-02 ENCOUNTER — Telehealth: Payer: Self-pay | Admitting: Internal Medicine

## 2014-10-02 MED ORDER — AMBULATORY NON FORMULARY MEDICATION: Status: DC

## 2014-10-02 NOTE — Telephone Encounter (Signed)
Patient with constipation.  He had a large painful BM.  He now has some bleeding on the tissue ad "I can feel a bump back there".  He stopped taking linzess that was prescribed in March.  He is encouraged to resume linzess, it did work well for him when he is taking it.  The prescription ran out and he just stopped taking it.  Dr. Hilarie Fredrickson please advise on rectal bleeding and possible hemorrhoids.

## 2014-10-02 NOTE — Telephone Encounter (Signed)
No APP appointments until January and that schedule is not open yet.  What do you want to do about follow up?   He is having pain.   Will send in NTG ointment to [facility].  Patient aware to contact them before going to pick it up to make sure it is ready He verbalized understanding

## 2014-10-02 NOTE — Telephone Encounter (Signed)
Patient notified to call back once he has been on treatment for 7-10 days with an update He verbalized understanding

## 2014-10-02 NOTE — Telephone Encounter (Signed)
I assume you mean no advanced practitioner appointments until July rather than January Have him call in 7-10 days if no better after treatment for presumed fissure will have to work him in for office visit

## 2014-10-02 NOTE — Telephone Encounter (Signed)
Agree with working on constipation to avoid exacerbation of hemorrhoids or possible fissure If still having pain with passing stool and bleeding than diagnosis likely fissure -- would prescribe glycerin ointment 0.25% 3 times a day 4-6 weeks (must be applied to the anal canal to approximately first knuckle) If bleeding without pain then would treat with Anusol HC suppository 25 mg twice a day 5 days Advanced practitioner appointment in 1-2 weeks for continuity of care

## 2014-10-12 DIAGNOSIS — M12271 Villonodular synovitis (pigmented), right ankle and foot: Secondary | ICD-10-CM | POA: Diagnosis not present

## 2014-10-12 DIAGNOSIS — M79671 Pain in right foot: Secondary | ICD-10-CM | POA: Diagnosis not present

## 2014-10-12 DIAGNOSIS — M7751 Other enthesopathy of right foot: Secondary | ICD-10-CM | POA: Diagnosis not present

## 2014-10-13 ENCOUNTER — Telehealth: Payer: Self-pay | Admitting: Internal Medicine

## 2014-10-13 NOTE — Telephone Encounter (Signed)
Pt states the rectal bleeding has stopped but he is still having discomfort. Pt states he can feel two areas in his rectum and is concerned. Pt scheduled to see Amy Esterwood PA 10/26/14@1 :30pm. Pt aware of appt.

## 2014-10-25 DIAGNOSIS — M65872 Other synovitis and tenosynovitis, left ankle and foot: Secondary | ICD-10-CM | POA: Diagnosis not present

## 2014-10-25 DIAGNOSIS — M12271 Villonodular synovitis (pigmented), right ankle and foot: Secondary | ICD-10-CM | POA: Diagnosis not present

## 2014-10-26 ENCOUNTER — Encounter: Payer: Self-pay | Admitting: Physician Assistant

## 2014-10-26 ENCOUNTER — Ambulatory Visit (INDEPENDENT_AMBULATORY_CARE_PROVIDER_SITE_OTHER): Payer: Medicare Other | Admitting: Physician Assistant

## 2014-10-26 VITALS — BP 100/68 | HR 88 | Ht 70.0 in | Wt 171.0 lb

## 2014-10-26 DIAGNOSIS — K5900 Constipation, unspecified: Secondary | ICD-10-CM

## 2014-10-26 DIAGNOSIS — K5909 Other constipation: Secondary | ICD-10-CM

## 2014-10-26 DIAGNOSIS — K6289 Other specified diseases of anus and rectum: Secondary | ICD-10-CM

## 2014-10-26 DIAGNOSIS — K589 Irritable bowel syndrome without diarrhea: Secondary | ICD-10-CM | POA: Diagnosis not present

## 2014-10-26 NOTE — Progress Notes (Addendum)
Patient ID: David Armstrong, male   DOB: [birth year], 64 y.o.   MRN: [id number]   Subjective:    Patient ID: David Armstrong, male    DOB: 1951-03-01, 64 y.o.   MRN: [id number]  HPI David Armstrong is a 64 year old white male known to David Armstrong. He has history of diverticulosis, GERD, IBS, anxiety and depression, and was labeled as "idiopathic anal pain syndrome after colonoscopy in 2014. At that time colonoscopy multiple diverticuli, no internal hemorrhoids were noted and no other anorectal lesion noted. Patient was last seen here in March 2016. He had been given a course of Xifaxan  For  IBS/bacterial overgrowth and did find that helpful. He was having problems with chronic constipation. He was given a trial of Linzess145 g daily. He says he did not find that helpful and has stopped it since that time. He may go several days between bowel movements and uses Dulcolax suppositories as needed. He denies any abdominal discomfort. He has been having some mild anal discomfort and says he is concerned because he felt some bumps on the inside of his rectum. Not seen any blood.  Review of Systems Pertinent positive and negative review of systems were noted in the above HPI section.  All other review of systems was otherwise negative.  Outpatient Encounter Prescriptions as of 10/26/2014  Medication Sig  . AMBULATORY NON FORMULARY MEDICATION Medication Name: Nitroglycerine ointment 0.125.  Apply a pea sized amount in anal canal to the first knuckle three times a day for 4-6 weeks  . atorvastatin (LIPITOR) 20 MG tablet Take 1 tablet (20 mg total) by mouth daily.  . bisacodyl (DULCOLAX) 5 MG EC tablet Take 5 mg by mouth daily as needed for moderate constipation.  . Linaclotide (LINZESS) 145 MCG CAPS capsule Take 1 capsule (145 mcg total) by mouth daily.  Marland Kitchen LORazepam (ATIVAN) 1 MG tablet Take 1 mg by mouth every 8 (eight) hours.   Marland Kitchen tiZANidine (ZANAFLEX) 4 MG tablet Take 1 tablet (4 mg total) by mouth every 6 (six)  hours as needed for muscle spasms.   No facility-administered encounter medications on file as of 10/26/2014.   Allergies  Allergen Reactions  . Dexilant [Dexlansoprazole] Other (See Comments)    Muscle aches  . Soma [Carisoprodol]     Sores on arm  . Sulfamethoxazole     REACTION: unspecified  . Ivp Dye [Iodinated Diagnostic Agents] Rash   Patient Active Problem List   Diagnosis Date Noted  . Nephrolithiasis 04/19/2014  . Peripheral neuropathy 12/15/2013  . Thrombosed external hemorrhoid s/p I&D [id number] 06/09/2012  . 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 IBS (irritable bowel syndrome) 03/09/2012  . Abdominal pain 05/19/2011  . 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 ABDOMINAL PAIN 11/23/2009  . GERD 10/26/2007  . DIVERTICULOSIS, COLON 10/25/2007  . RENAL CALCULUS 10/25/2007  . HEMORRHOIDS 11/24/2006  . Hyperlipidemia 11/23/2006  . ANXIETY 11/23/2006  . DEPRESSION 11/23/2006  . COLONIC POLYPS, HX OF 11/23/2006   History   Social History  . Marital Status: Single    Spouse Name: N/A  . Number of Children: 0  . Years of Education: N/A   Occupational History  . retired [employer]    disabled now   Social History Main Topics  . Smoking status: Never Smoker   . Smokeless tobacco: Never Used  . Alcohol Use: No  . Drug Use: No  . Sexual Activity: Not on file   Other Topics Concern  . Not on file  Social History Narrative    David Armstrong family history includes Cirrhosis in his father; Heart disease in his brother, father, and mother; Parkinson's disease in his paternal grandfather; Prostate cancer in his maternal grandfather; Skin cancer in his father; Stroke in his mother. There is no history of Colon cancer.      Objective:    Filed Vitals:   10/26/14 1344  BP: 100/68  Pulse: 88    Physical Exam  well-developed white male in no acute distress, blood pressure 100/68 pulse 88 height 5 foot 10 weight 171 HEENT; nontraumatic normocephalic EOMI  PERRLA sclera anicteric, Cardiovascular ;regular rate and rhythm with S1-S2 no murmur or gallop, Pulmonary; clear bilaterally, Abdomen; soft nontender nondistended bowel sounds are active there is no palpable mass or hepatosplenomegaly, Rectal; exam, no external lesions noted, on anoscopy does have one internal hemorrhoid no other lesions noted and on digital exam unable to palpate any abnormality, Neuropsych; mood and affect appropriate, Ext;no clubbing cyanosis or edema skin warm and dry       Assessment & Plan:   #1 64 yo male with IBS, chronic constipation- Linzess not helpful #2 diverticulosis #3 anal bumps- exam benign- anal papillae may be what he felt #4 internal hemorrhoid  Plan; moistened tissue Stop Linzess  [place] Miralax or Benefiber  One dose every day , and liberal water intake Follow up David Armstrong as needed   David S Esterwood PA-C 10/26/2014   Cc: David Pacas, DO Addendum: Reviewed and agree with management. David Bears, MD

## 2014-10-26 NOTE — Patient Instructions (Signed)
Stop Linzess.  Start Miralax 17 grams in 8 oz of water every day.   Follow up with Dr. Zenovia Jarred as needed.

## 2014-11-08 DIAGNOSIS — M12271 Villonodular synovitis (pigmented), right ankle and foot: Secondary | ICD-10-CM | POA: Diagnosis not present

## 2014-11-08 DIAGNOSIS — M7751 Other enthesopathy of right foot: Secondary | ICD-10-CM | POA: Diagnosis not present

## 2014-11-08 DIAGNOSIS — B079 Viral wart, unspecified: Secondary | ICD-10-CM | POA: Diagnosis not present

## 2014-11-14 DIAGNOSIS — L400 Psoriasis vulgaris: Secondary | ICD-10-CM | POA: Diagnosis not present

## 2014-11-14 DIAGNOSIS — B079 Viral wart, unspecified: Secondary | ICD-10-CM | POA: Diagnosis not present

## 2014-11-22 DIAGNOSIS — M12271 Villonodular synovitis (pigmented), right ankle and foot: Secondary | ICD-10-CM | POA: Diagnosis not present

## 2014-11-22 DIAGNOSIS — M7751 Other enthesopathy of right foot: Secondary | ICD-10-CM | POA: Diagnosis not present

## 2014-11-29 ENCOUNTER — Ambulatory Visit (INDEPENDENT_AMBULATORY_CARE_PROVIDER_SITE_OTHER): Payer: Medicare Other | Admitting: Family Medicine

## 2014-11-29 ENCOUNTER — Encounter: Payer: Self-pay | Admitting: Family Medicine

## 2014-11-29 VITALS — BP 113/72 | HR 72 | Wt 170.0 lb

## 2014-11-29 DIAGNOSIS — H00013 Hordeolum externum right eye, unspecified eyelid: Secondary | ICD-10-CM | POA: Diagnosis not present

## 2014-11-29 DIAGNOSIS — S29011A Strain of muscle and tendon of front wall of thorax, initial encounter: Secondary | ICD-10-CM

## 2014-11-29 DIAGNOSIS — R238 Other skin changes: Secondary | ICD-10-CM | POA: Diagnosis not present

## 2014-11-29 DIAGNOSIS — R233 Spontaneous ecchymoses: Secondary | ICD-10-CM

## 2014-11-29 LAB — CBC
HEMATOCRIT: 42.8 % (ref 39.0–52.0)
HEMOGLOBIN: 14.1 g/dL (ref 13.0–17.0)
MCH: 29.8 pg (ref 26.0–34.0)
MCHC: 32.9 g/dL (ref 30.0–36.0)
MCV: 90.5 fL (ref 78.0–100.0)
MPV: 10.3 fL (ref 8.6–12.4)
Platelets: 194 10*3/uL (ref 150–400)
RBC: 4.73 MIL/uL (ref 4.22–5.81)
RDW: 13.8 % (ref 11.5–15.5)
WBC: 4.9 10*3/uL (ref 4.0–10.5)

## 2014-11-29 MED ORDER — POLYMYXIN B-TRIMETHOPRIM 10000-0.1 UNIT/ML-% OP SOLN: 2.0000 [drp] | OPHTHALMIC | Status: DC

## 2014-11-29 NOTE — Progress Notes (Signed)
CC: David Armstrong is a 64 y.o. male is here for pulled muscle in right upper chest? and bruising easily   Subjective: HPI:  Over the last 1-2 weeks he's noticed that his easily bruising on his forearms and hands. He denies any recent trauma or overexertion. He denies bleeding elsewhere. There's been no change to his medication regimen and no new over-the-counter supplements. He denies any blood in stool or blood when brushing his teeth. He denies any chest pain other than that described below and there has been no shortness of breath.  Complains of a red bump on the right upper lateral eyelid. It's slightly tender. No interventions as of yet it's been there for an unknown amount of time, he guesses a few days. It is not getting bigger or smaller. Not interfering with vision. No fevers, chills nor nausea.  One half weeks ago he was reaching down in an awkward position stretching out his right arm and he felt a squeezing spasm sensation in his right chest. It's worse when pressing on the chest just above and lateral to the right nipple. There is no reproduction of pain with any particular movement of the Right shoulder. He denies any shoulder pain. No overlying skin changes. It's been getting slowly better without any intervention it's only mild in severity.     ev[place] Of Systems Outlined In HPI  Past Medical History  Diagnosis Date  . ANXIETY   . Personal history of colonic polyps 10/27/2007    HYPERPLASTIC POLYP  . DEPRESSION   . DIVERTICULOSIS, COLON [phone]    Colonoscopy  . GERD   . HYPERLIPIDEMIA   . RENAL CALCULUS   . IBS (irritable bowel syndrome)   . Hiatal hernia [phone]    EGD  . H. pylori infection   . Arthritis   . Anal fissure   . TMJ (temporomandibular joint syndrome)   . Unspecified gastritis and gastroduodenitis without mention of hemorrhage 2009    EGD  . Hemorrhoids [id number]    Colonoscopy   . Peripheral neuropathy   . Fatty liver   . Internal  hemorrhoids     Past Surgical History  Procedure Laterality Date  . Knee surgery Left   . Shoulder surgery Bilateral 07/2005, 03/2011    left x 2  . Heel spur surgery Bilateral   . Hammer toe surgery Left   . Lithotripsy    . 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 Partial knee arthroplasty Right   . Foot neuroma surgery Right   . Eye lid surgery Bilateral 01/2014   Family History  Problem Relation Age of Onset  . Heart disease Mother   . Stroke Mother   . Cirrhosis Father   . Heart disease Father   . Skin cancer Father   . Colon cancer Neg Hx   . Prostate cancer Maternal Grandfather   . Parkinson's disease Paternal Grandfather   . Heart disease Brother     Social History   Social History  . Marital Status: Single    Spouse Name: N/A  . Number of Children: 0  . Years of Education: N/A   Occupational History  . retired [employer]    disabled now   Social History Main Topics  . Smoking status: Never Smoker   . Smokeless tobacco: Never Used  . Alcohol Use: No  . Drug Use: No  . Sexual Activity: Not on file   Other Topics Concern  . Not on file   Social  History Narrative     Objective: BP 113/72 mmHg  Pulse 72  Wt 170 lb (77.111 kg)  General: Alert and Oriented, No Acute Distress HEENT: Pupils equal, round, reactive to light. Conjunctivae clear with a stye on the right lateral upper eyelid.   moist membranes  Lungs: Clear to auscultation bilaterally, no wheezing/ronchi/rales.  Comfortable work of breathing. Good air movement. Cardiac: Regular rate and rhythm. Normal S1/S2.  No murmurs, rubs, nor gallops.   Chest: Pain is reproduced with palpation of the right pectoralis muscle just above the right nipple. There is no palpable abnormality or rib pain. Extremities: No peripheral edema.  Strong peripheral pulses.  Mental Status: No depression, anxiety, nor agitation. Skin: Warm and dry. mild bruising on the back of the right hand and left hand   Assessment  & Plan: David Armstrong was seen today for pulled muscle in right upper chest? and bruising easily.  Diagnoses and all orders for this visit:  Stye, right -     trimethoprim-polymyxin b (POLYTRIM) ophthalmic solution; Place 2 drops into the right eye every 4 (four) hours. For seven days.  Easy bruising -     CBC -     Protime-INR -     PTT  Chest wall muscle strain, initial encounter   Right stye: Start Polytrim consider warm compresses throughout the day Easy bruising: Discussed this is most likely due to his age, senile purpura, will rule out more serious bleeding abnormality with the above labs. Chest wall strain: Discussed stretches of the pectoralis muscle to help with the healing process, reassurance provided that this will heal on its own within the next 1-2 weeks.  Return if symptoms worsen or fail to improve.

## 2014-11-30 LAB — PROTIME-INR
INR: 1.04 (ref <1.50)
Prothrombin Time: 13.6 seconds (ref 11.6–15.2)

## 2014-11-30 LAB — APTT: APTT: 28 s (ref 24–37)

## 2014-12-05 ENCOUNTER — Telehealth: Payer: Self-pay

## 2014-12-05 DIAGNOSIS — H0289 Other specified disorders of eyelid: Secondary | ICD-10-CM

## 2014-12-05 NOTE — Telephone Encounter (Signed)
Left detailed message.   

## 2014-12-05 NOTE — Telephone Encounter (Signed)
David Armstrong reports a worsening of symptoms. His right eye is itching and has more swelling. He believes the eye drops has made the area worse. Please advise.

## 2014-12-05 NOTE — Telephone Encounter (Signed)
Understood, discontinue the eye drops and it would be best to now have a comprehensive eye exam.  This can be done with his regular opthalmologist, if he has any difficulty getting an appointment I'll place a referral just in case.

## 2014-12-11 DIAGNOSIS — H0012 Chalazion right lower eyelid: Secondary | ICD-10-CM | POA: Diagnosis not present

## 2014-12-11 DIAGNOSIS — H018 Other specified inflammations of eyelid: Secondary | ICD-10-CM | POA: Diagnosis not present

## 2014-12-11 DIAGNOSIS — H11433 Conjunctival hyperemia, bilateral: Secondary | ICD-10-CM | POA: Diagnosis not present

## 2014-12-19 DIAGNOSIS — L400 Psoriasis vulgaris: Secondary | ICD-10-CM | POA: Diagnosis not present

## 2014-12-19 DIAGNOSIS — B079 Viral wart, unspecified: Secondary | ICD-10-CM | POA: Diagnosis not present

## 2014-12-25 DIAGNOSIS — M7752 Other enthesopathy of left foot: Secondary | ICD-10-CM | POA: Diagnosis not present

## 2014-12-25 DIAGNOSIS — M722 Plantar fascial fibromatosis: Secondary | ICD-10-CM | POA: Diagnosis not present

## 2014-12-25 DIAGNOSIS — G5762 Lesion of plantar nerve, left lower limb: Secondary | ICD-10-CM | POA: Diagnosis not present

## 2015-01-04 DIAGNOSIS — H0011 Chalazion right upper eyelid: Secondary | ICD-10-CM | POA: Diagnosis not present

## 2015-01-04 DIAGNOSIS — H018 Other specified inflammations of eyelid: Secondary | ICD-10-CM | POA: Diagnosis not present

## 2015-01-06 ENCOUNTER — Emergency Department (HOSPITAL_COMMUNITY)
Admission: EM | Admit: 2015-01-06 | Discharge: 2015-01-06 | Disposition: A | Discharge status: Home / Self Care | Payer: Medicare Other | Attending: Emergency Medicine | Admitting: Emergency Medicine

## 2015-01-06 ENCOUNTER — Encounter (HOSPITAL_COMMUNITY): Payer: Self-pay

## 2015-01-06 DIAGNOSIS — H109 Unspecified conjunctivitis: Secondary | ICD-10-CM | POA: Insufficient documentation

## 2015-01-06 DIAGNOSIS — F329 Major depressive disorder, single episode, unspecified: Secondary | ICD-10-CM | POA: Diagnosis not present

## 2015-01-06 DIAGNOSIS — Z86010 Personal history of colonic polyps: Secondary | ICD-10-CM | POA: Insufficient documentation

## 2015-01-06 DIAGNOSIS — Z79899 Other long term (current) drug therapy: Secondary | ICD-10-CM | POA: Insufficient documentation

## 2015-01-06 DIAGNOSIS — Z8739 Personal history of other diseases of the musculoskeletal system and connective tissue: Secondary | ICD-10-CM | POA: Insufficient documentation

## 2015-01-06 DIAGNOSIS — Z87442 Personal history of urinary calculi: Secondary | ICD-10-CM | POA: Insufficient documentation

## 2015-01-06 DIAGNOSIS — F411 Generalized anxiety disorder: Secondary | ICD-10-CM | POA: Diagnosis not present

## 2015-01-06 DIAGNOSIS — Z8719 Personal history of other diseases of the digestive system: Secondary | ICD-10-CM | POA: Diagnosis not present

## 2015-01-06 DIAGNOSIS — Z8619 Personal history of other infectious and parasitic diseases: Secondary | ICD-10-CM | POA: Insufficient documentation

## 2015-01-06 DIAGNOSIS — Z8669 Personal history of other diseases of the nervous system and sense organs: Secondary | ICD-10-CM | POA: Diagnosis not present

## 2015-01-06 MED ORDER — ERYTHROMYCIN 5 MG/GM OP OINT: 1.0000 "application " | TOPICAL_OINTMENT | Freq: Four times a day (QID) | OPHTHALMIC | Status: DC

## 2015-01-06 NOTE — Discharge Instructions (Signed)
Please use your eye ointment as prescribed. Follow-up with her ophthalmologist in 2 days for reevaluation. Return to ED for worsening symptoms.  Bacterial Conjunctivitis Bacterial conjunctivitis, commonly called pink eye, is an inflammation of the clear membrane that covers the white part of the eye (conjunctiva). The inflammation can also happen on the underside of the eyelids. The blood vessels in the conjunctiva become inflamed, causing the eye to become red or pink. Bacterial conjunctivitis may spread easily from one eye to another and from person to person (contagious).  CAUSES  Bacterial conjunctivitis is caused by bacteria. The bacteria may come from your own skin, your upper respiratory tract, or from someone else with bacterial conjunctivitis. SYMPTOMS  The normally white color of the eye or the underside of the eyelid is usually pink or red. The pink eye is usually associated with irritation, tearing, and some sensitivity to light. Bacterial conjunctivitis is often associated with a thick, yellowish discharge from the eye. The discharge may turn into a crust on the eyelids overnight, which causes your eyelids to stick together. If a discharge is present, there may also be some blurred vision in the affected eye. DIAGNOSIS  Bacterial conjunctivitis is diagnosed by your caregiver through an eye exam and the symptoms that you report. Your caregiver looks for changes in the surface tissues of your eyes, which may point to the specific type of conjunctivitis. A sample of any discharge may be collected on a cotton-tip swab if you have a severe case of conjunctivitis, if your cornea is affected, or if you keep getting repeat infections that do not respond to treatment. The sample will be sent to a lab to see if the inflammation is caused by a bacterial infection and to see if the infection will respond to antibiotic medicines. TREATMENT   Bacterial conjunctivitis is treated with antibiotics.  Antibiotic eyedrops are most often used. However, antibiotic ointments are also available. Antibiotics pills are sometimes used. Artificial tears or eye washes may ease discomfort. HOME CARE INSTRUCTIONS   To ease discomfort, apply a cool, clean washcloth to your eye for 10-20 minutes, 3-4 times a day.  Gently wipe away any drainage from your eye with a warm, wet washcloth or a cotton ball.  Wash your hands often with soap and water. Use paper towels to dry your hands.  Do not share towels or washcloths. This may spread the infection.  Change or wash your pillowcase every day.  You should not use eye makeup until the infection is gone.  Do not operate machinery or drive if your vision is blurred.  Stop using contact lenses. Ask your caregiver how to sterilize or replace your contacts before using them again. This depends on the type of contact lenses that you use.  When applying medicine to the infected eye, do not touch the edge of your eyelid with the eyedrop bottle or ointment tube. SEEK IMMEDIATE MEDICAL CARE IF:   Your infection has not improved within 3 days after beginning treatment.  You had yellow discharge from your eye and it returns.  You have increased eye pain.  Your eye redness is spreading.  Your vision becomes blurred.  You have a fever or persistent symptoms for more than 2-3 days.  You have a fever and your symptoms suddenly get worse.  You have facial pain, redness, or swelling. MAKE SURE YOU:   Understand these instructions.  Will watch your condition.  Will get help right away if you are not doing well or  get worse. Document Released: 03/31/2005 Document Revised: 08/15/2013 Document Reviewed: 09/01/2011 [facility] Patient Information 2015 [place], [place]. This information is not intended to replace advice given to you by your health care provider. Make sure you discuss any questions you have with your health care provider.  Blepharitis Blepharitis  is redness, soreness, and swelling (inflammation) of one or both eyelids. It may be caused by an allergic reaction or a bacterial infection. Blepharitis may also be associated with reddened, scaly skin (seborrhea) of the scalp and eyebrows. While you sleep, eye discharge may cause your eyelashes to stick together. Your eyelids may itch, burn, swell, and may lose their lashes. These will grow back. Your eyes may become sensitive. Blepharitis may recur and need repeated treatment. If this is the case, you may require further evaluation by an eye specialist (ophthalmologist). HOME CARE INSTRUCTIONS   Keep your hands clean.  Use a clean towel each time you dry your eyelids. Do not use this towel to clean other areas. Do not share a towel or makeup with anyone.  Wash your eyelids with warm water or warm water mixed with a small amount of baby shampoo. Do this twice a day or as often as needed.  Wash your face and eyebrows at least once a day.  Use warm compresses 2 times a day for 10 minutes at a time, or as directed by your caregiver.  Apply antibiotic ointment as directed by your caregiver.  Avoid rubbing your eyes.  Avoid wearing makeup until you get better.  Follow up with your caregiver as directed. SEEK IMMEDIATE MEDICAL CARE IF:   You have pain, redness, or swelling that gets worse or spreads to other parts of your face.  Your vision changes, or you have pain when looking at lights or moving objects.  You have a fever.  Your symptoms continue for longer than 2 to 4 days or become worse. MAKE SURE YOU:   Understand these instructions.  Will watch your condition.  Will get help right away if you are not doing well or get worse. Document Released: 03/28/2000 Document Revised: 06/23/2011 Document Reviewed: 05/08/2010 [facility] Patient Information 2015 [place], [place]. This information is not intended to replace advice given to you by your health care provider. Make sure you discuss  any questions you have with your health care provider.

## 2015-01-06 NOTE — ED Provider Notes (Signed)
[name]: [id number]     Arrival date & time 01/06/15  1533 History  This chart was scribed for Comer Locket, PA-C, working with Varney Biles, MD by Steva Colder, ED Scribe. The patient was seen in room WTR9/WTR9 at 4:48 PM.    Chief Complaint  Patient presents with  . Conjunctivitis      The history is provided by the patient. No language interpreter was used.    HPI Comments: David Armstrong is a 64 y.o. male who presents to the Emergency Department complaining of worsening right eye problem onset 2 days ago. He notes that he had a stye removed from his upper right eyelid by his eye doctor this week and is now having residual right eye redness/drainage since yesterday. He reports that he has not contacted his eye doctor with his symptoms since thursday. He states that he was Rx azithromycin drops and his doctor was supposed to Rx him an abx cream. He reports that he has mild pain, burning sensation and a gritty sensation. 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 He reports that he only wears glasses. He states that he is having associated symptoms of eye discharge. He states that he has tried abx eye drops with no relief for his symptoms. He denies any other symptoms.  Past Medical History  Diagnosis Date  . ANXIETY   . Personal history of colonic polyps 10/27/2007    HYPERPLASTIC POLYP  . DEPRESSION   . DIVERTICULOSIS, COLON [phone]    Colonoscopy  . GERD   . HYPERLIPIDEMIA   . RENAL CALCULUS   . IBS (irritable bowel syndrome)   . Hiatal hernia [phone]    EGD  . H. pylori infection   . Arthritis   . Anal fissure   . TMJ (temporomandibular joint syndrome)   . Unspecified gastritis and gastroduodenitis without mention of hemorrhage 2009    EGD  . Hemorrhoids [id number]    Colonoscopy   . Peripheral neuropathy   . Fatty liver   . Internal hemorrhoids    Past Surgical History  Procedure Laterality Date  . Knee surgery Left   . Shoulder  surgery Bilateral 07/2005, 03/2011    left x 2  . Heel spur surgery Bilateral   . Hammer toe surgery Left   . Lithotripsy    . 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 Partial knee arthroplasty Right   . Foot neuroma surgery Right   . Eye lid surgery Bilateral 01/2014   Family History  Problem Relation Age of Onset  . Heart disease Mother   . Stroke Mother   . Cirrhosis Father   . Heart disease Father   . Skin cancer Father   . Colon cancer Neg Hx   . Prostate cancer Maternal Grandfather   . Parkinson's disease Paternal Grandfather   . Heart disease Brother    Social History  Substance Use Topics  . Smoking status: Never Smoker   . Smokeless tobacco: Never Used  . Alcohol Use: No    Review of Systems  Eyes: Positive for pain, discharge and redness.  Skin: Negative for color change, rash and wound.  All other systems reviewed and are negative.   Allergies  Dexilant; Soma; Sulfamethoxazole; and Ivp dye  Home Medications   Prior to Admission medications   Medication Sig Start Date End Date Taking? Authorizing Provider  AMBULATORY NON FORMULARY MEDICATION Medication Name: Nitroglycerine ointment 0.125.  Apply a pea sized amount in  anal canal to the first knuckle three times a day for 4-6 weeks Patient not taking: Reported on 11/29/2014 10/02/14   Jerene Bears, MD  atorvastatin (LIPITOR) 20 MG tablet Take 1 tablet (20 mg total) by mouth daily. Patient not taking: Reported on 11/29/2014 08/14/14   Marcial Pacas, DO  bisacodyl (DULCOLAX) 5 MG EC tablet Take 5 mg by mouth daily as needed for moderate constipation.    Historical Provider, MD  erythromycin ophthalmic ointment Place 1 application into the right eye every 6 (six) hours. Place 1/2 inch ribbon of ointment in the affected eye 4 times a day 01/06/15   Comer Locket, PA-C  Linaclotide [facility]) 145 MCG CAPS capsule Take 1 capsule (145 mcg total) by mouth daily. Patient not taking: Reported on 11/29/2014 07/05/14   Jerene Bears,  MD  LORazepam (ATIVAN) 1 MG tablet Take 1 mg by mouth every 8 (eight) hours.  04/25/10   Historical Provider, MD  tiZANidine (ZANAFLEX) 4 MG tablet Take 1 tablet (4 mg total) by mouth every 6 (six) hours as needed for muscle spasms. Patient not taking: Reported on 11/29/2014 08/07/14   Sean Hommel, DO   BP 110/74 mmHg  Pulse 79  Temp(Src) 97.7 F (36.5 C) (Oral)  Resp 18  SpO2 100% Physical Exam  Constitutional: He is oriented to person, place, and time. He appears well-developed and well-nourished. No distress.  HENT:  Head: Normocephalic and atraumatic.  Eyes: EOM are normal. Right eye exhibits discharge. Left eye exhibits no discharge.  Yellow discharge noted to lateral canthus of right eye with mild palpebral and bulbar conjunctivitis.   Neck: Neck supple.  Cardiovascular: Normal rate, regular rhythm and normal heart sounds.  Exam reveals no gallop and no friction rub.   No murmur heard. Pulmonary/Chest: Effort normal and breath sounds normal. No respiratory distress. He has no wheezes. He has no rales.  Musculoskeletal: Normal range of motion.  Neurological: He is alert and oriented to person, place, and time.  Skin: Skin is warm and dry.  Psychiatric: He has a normal mood and affect. His behavior is normal.  Nursing note and vitals reviewed.   ED Course  Procedures (including critical care time) DIAGNOSTIC STUDIES: Oxygen Saturation is 94% on RA, adequate by my interpretation.    COORDINATION OF CARE: 5:00 PM Discussed treatment plan with pt at bedside which includes warm compresses, abx ointment, and f/u with eye doctor and pt agreed to plan.    Labs Review Labs Reviewed - No data to display  Imaging Review No results found. I have personally reviewed and evaluated these images and lab results as part of my medical decision-making.   EKG Interpretation None     Meds given in ED:  Medications - No data to display  Discharge Medication List as of 01/06/2015  4:58  PM    START taking these medications   Details  erythromycin ophthalmic ointment Place 1 application into the right eye every 6 (six) hours. Place 1/2 inch ribbon of ointment in the affected eye 4 times a day, Starting 01/06/2015, Until Discontinued, Print       Filed Vitals:   01/06/15 1539 01/06/15 1704  BP: 105/72 110/74  Pulse: 88 79  Temp: 97.8 F (36.6 C) 97.7 F (36.5 C)  TempSrc: Oral Oral  Resp: 18   SpO2: 94% 100%    MDM  Vitals stable - WNL -afebrile Pt resting comfortably in ED. PE--patient with right eye purulent discharge following removal of stye 1  week ago. No eye pain, vision changes. No evidence of pre-or post-septal cellulitis. Symptoms consistent with conjunctivitis. Patient reports his eye doctor was going to call him in some erythromycin ointment, but forgot to. Will go ahead and prescribe erythromycin ointment. Discussed patient with follow-up with his ophthalmologist within the next 2 days for reevaluation. I discussed all relevant lab findings and imaging results with pt and they verbalized understanding. Discussed f/u with PCP within 48 hrs and return precautions, pt very amenable to plan.  Final diagnoses:  Conjunctivitis of right eye    I personally performed the services described in this documentation, which was scribed in my presence. The recorded information has been reviewed and is accurate.   Comer Locket, PA-C 01/07/15 [address] PA-C 01/07/15 [place], MD 01/15/15 [phone]

## 2015-01-06 NOTE — ED Notes (Signed)
He states he had a stye removed by his eye doctor this week.  He c/o right eye scleral redness and drainage since yesterday.  He is in no distress.

## 2015-01-11 DIAGNOSIS — M71572 Other bursitis, not elsewhere classified, left ankle and foot: Secondary | ICD-10-CM | POA: Diagnosis not present

## 2015-01-11 DIAGNOSIS — M7752 Other enthesopathy of left foot: Secondary | ICD-10-CM | POA: Diagnosis not present

## 2015-01-13 DIAGNOSIS — Z8701 Personal history of pneumonia (recurrent): Secondary | ICD-10-CM

## 2015-01-13 HISTORY — DX: Personal history of pneumonia (recurrent): Z87.01

## 2015-01-13 HISTORY — PX: BELPHAROPTOSIS REPAIR: SHX369

## 2015-01-14 ENCOUNTER — Emergency Department (HOSPITAL_COMMUNITY): Payer: Medicare Other

## 2015-01-14 ENCOUNTER — Emergency Department (HOSPITAL_COMMUNITY)
Admission: EM | Admit: 2015-01-14 | Discharge: 2015-01-15 | Disposition: A | Discharge status: Home / Self Care | Payer: Medicare Other | Attending: Emergency Medicine | Admitting: Emergency Medicine

## 2015-01-14 ENCOUNTER — Encounter (HOSPITAL_COMMUNITY): Payer: Self-pay | Admitting: Oncology

## 2015-01-14 DIAGNOSIS — Z8669 Personal history of other diseases of the nervous system and sense organs: Secondary | ICD-10-CM | POA: Diagnosis not present

## 2015-01-14 DIAGNOSIS — Z8619 Personal history of other infectious and parasitic diseases: Secondary | ICD-10-CM | POA: Insufficient documentation

## 2015-01-14 DIAGNOSIS — R109 Unspecified abdominal pain: Secondary | ICD-10-CM | POA: Diagnosis present

## 2015-01-14 DIAGNOSIS — Z8719 Personal history of other diseases of the digestive system: Secondary | ICD-10-CM | POA: Insufficient documentation

## 2015-01-14 DIAGNOSIS — F419 Anxiety disorder, unspecified: Secondary | ICD-10-CM | POA: Insufficient documentation

## 2015-01-14 DIAGNOSIS — J189 Pneumonia, unspecified organism: Secondary | ICD-10-CM

## 2015-01-14 DIAGNOSIS — Z79899 Other long term (current) drug therapy: Secondary | ICD-10-CM | POA: Insufficient documentation

## 2015-01-14 DIAGNOSIS — Z86010 Personal history of colonic polyps: Secondary | ICD-10-CM | POA: Insufficient documentation

## 2015-01-14 DIAGNOSIS — R000 Tachycardia, unspecified: Secondary | ICD-10-CM | POA: Diagnosis not present

## 2015-01-14 DIAGNOSIS — Z8739 Personal history of other diseases of the musculoskeletal system and connective tissue: Secondary | ICD-10-CM | POA: Diagnosis not present

## 2015-01-14 DIAGNOSIS — F329 Major depressive disorder, single episode, unspecified: Secondary | ICD-10-CM | POA: Insufficient documentation

## 2015-01-14 DIAGNOSIS — E785 Hyperlipidemia, unspecified: Secondary | ICD-10-CM | POA: Insufficient documentation

## 2015-01-14 DIAGNOSIS — R1110 Vomiting, unspecified: Secondary | ICD-10-CM | POA: Diagnosis not present

## 2015-01-14 DIAGNOSIS — R1084 Generalized abdominal pain: Secondary | ICD-10-CM | POA: Diagnosis not present

## 2015-01-14 DIAGNOSIS — J159 Unspecified bacterial pneumonia: Secondary | ICD-10-CM | POA: Diagnosis not present

## 2015-01-14 DIAGNOSIS — R509 Fever, unspecified: Secondary | ICD-10-CM | POA: Diagnosis not present

## 2015-01-14 LAB — COMPREHENSIVE METABOLIC PANEL
ALBUMIN: 4.4 g/dL (ref 3.5–5.0)
ALT: 23 U/L (ref 17–63)
AST: 23 U/L (ref 15–41)
Alkaline Phosphatase: 112 U/L (ref 38–126)
Anion gap: 8 (ref 5–15)
BUN: 13 mg/dL (ref 6–20)
CHLORIDE: 101 mmol/L (ref 101–111)
CO2: 26 mmol/L (ref 22–32)
Calcium: 9.1 mg/dL (ref 8.9–10.3)
Creatinine, Ser: 0.98 mg/dL (ref 0.61–1.24)
GFR calc Af Amer: >60 mL/min (ref >60)
GFR calc non Af Amer: >60 mL/min (ref >60)
GLUCOSE: 120 mg/dL — AB (ref 65–99)
POTASSIUM: 3.8 mmol/L (ref 3.5–5.1)
SODIUM: 135 mmol/L (ref 135–145)
Total Bilirubin: 1.1 mg/dL (ref 0.3–1.2)
Total Protein: 7.5 g/dL (ref 6.5–8.1)

## 2015-01-14 LAB — CBC
HEMATOCRIT: 45.3 % (ref 39.0–52.0)
HEMOGLOBIN: 15.3 g/dL (ref 13.0–17.0)
MCH: 30.2 pg (ref 26.0–34.0)
MCHC: 33.8 g/dL (ref 30.0–36.0)
MCV: 89.5 fL (ref 78.0–100.0)
Platelets: 163 10*3/uL (ref 150–400)
RBC: 5.06 MIL/uL (ref 4.22–5.81)
RDW: 12.9 % (ref 11.5–15.5)
WBC: 9.9 10*3/uL (ref 4.0–10.5)

## 2015-01-14 LAB — URINALYSIS, ROUTINE W REFLEX MICROSCOPIC
BILIRUBIN URINE: NEGATIVE
Glucose, UA: NEGATIVE mg/dL
HGB URINE DIPSTICK: NEGATIVE
Ketones, ur: NEGATIVE mg/dL
Leukocytes, UA: NEGATIVE
NITRITE: NEGATIVE
PH: 7.5 (ref 5.0–8.0)
Protein, ur: NEGATIVE mg/dL
SPECIFIC GRAVITY, URINE: 1.017 (ref 1.005–1.030)
Urobilinogen, UA: 1 mg/dL (ref 0.0–1.0)

## 2015-01-14 LAB — LIPASE, BLOOD: LIPASE: 24 U/L (ref 22–51)

## 2015-01-14 LAB — I-STAT CG4 LACTIC ACID, ED: Lactic Acid, Venous: 1.87 mmol/L (ref 0.5–2.0)

## 2015-01-14 MED ORDER — FENTANYL CITRATE (PF) 100 MCG/2ML IJ SOLN
100.0000 ug | Freq: Once | INTRAMUSCULAR | Status: AC
  Administered 2015-01-14: 100 ug via INTRAVENOUS
  Filled 2015-01-14: qty 2

## 2015-01-14 MED ORDER — SODIUM CHLORIDE 0.9 % IV BOLUS (SEPSIS)
1000.0000 mL | Freq: Once | INTRAVENOUS | Status: AC
  Administered 2015-01-14: 1000 mL via INTRAVENOUS

## 2015-01-14 MED ORDER — ONDANSETRON 4 MG PO TBDP
4.0000 mg | ORAL_TABLET | Freq: Once | ORAL | Status: AC | PRN
  Administered 2015-01-14: 4 mg via ORAL
  Filled 2015-01-14: qty 1

## 2015-01-14 MED ORDER — ACETAMINOPHEN 325 MG PO TABS
650.0000 mg | ORAL_TABLET | Freq: Once | ORAL | Status: AC
  Administered 2015-01-14: 650 mg via ORAL
  Filled 2015-01-14: qty 2

## 2015-01-14 MED ORDER — ACETAMINOPHEN 325 MG PO TABS
650.0000 mg | ORAL_TABLET | Freq: Once | ORAL | Status: DC | PRN
  Filled 2015-01-14: qty 2

## 2015-01-14 MED ORDER — DEXTROSE 5 % IV SOLN
1.0000 g | Freq: Once | INTRAVENOUS | Status: AC
  Administered 2015-01-15: 1 g via INTRAVENOUS
  Filled 2015-01-14: qty 10

## 2015-01-14 MED ORDER — AZITHROMYCIN 250 MG PO TABS
500.0000 mg | ORAL_TABLET | Freq: Once | ORAL | Status: AC
  Administered 2015-01-15: 500 mg via ORAL
  Filled 2015-01-14: qty 2

## 2015-01-14 NOTE — ED Notes (Signed)
Pt presents w/ c/o abdominal pain and N/V.  VSS en route.

## 2015-01-14 NOTE — ED Notes (Signed)
Pt given urinal and aware sample is needed

## 2015-01-14 NOTE — ED Provider Notes (Signed)
[name]: [id number]     Arrival date & time 01/14/15  2028 History   First MD Initiated Contact with Patient 01/14/15 2053     Chief Complaint  Patient presents with  . Abdominal Pain     (Consider location/radiation/quality/duration/timing/severity/associated sxs/prior Treatment) HPI   PCP: Marcial Pacas, DO PMH: Anxiety, depression, GERD, IBS, anal fissure, hemorrhoids, fatty liver, internal hemorrhoids  David Armstrong is a 64 y.o.  male presents to the emergency department by private car with complaints of abdominal pain, weakness with nausea and vomiting. He is noted to be febrile tachycardic in triage. Temperature 102.7 pulse 121. He says he hasn't been feeling well for about a year and got acutely worse today   Patient says he had left upper and lower jaw pain, went to [facility], got his teeth cleaned and was started on abx. He started Penicillin today. He reports he feels sick and his abdominal pain is diffuse but worse in the right upper right lower quadrant. He has hx of IBS and therefore frequent  Problems with  abdominal pain and neuropathy but this pain started sometime today he reports not being quite sure. It is difficult to obtain history from him because he says that he doesn't know how many times he has vomited. Denies that he has been coughing. Denies back.  Pt is a poor historian and obtaining a HPI is difficult.   Past Medical History  Diagnosis Date  . ANXIETY   . Personal history of colonic polyps 10/27/2007    HYPERPLASTIC POLYP  . DEPRESSION   . DIVERTICULOSIS, COLON [phone]    Colonoscopy  . GERD   . HYPERLIPIDEMIA   . RENAL CALCULUS   . IBS (irritable bowel syndrome)   . Hiatal hernia [id number]    EGD  . H. pylori infection   . Arthritis   . Anal fissure   . TMJ (temporomandibular joint syndrome)   . Unspecified gastritis and gastroduodenitis without mention of hemorrhage 2009    EGD  . Hemorrhoids [id number]    Colonoscopy   . Peripheral  neuropathy ([place])   . Fatty liver   . Internal hemorrhoids    Past Surgical History  Procedure Laterality Date  . Knee surgery Left   . Shoulder surgery Bilateral 07/2005, 03/2011    left x 2  . Heel spur surgery Bilateral   . Hammer toe surgery Left   . Lithotripsy    . 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 Partial knee arthroplasty Right   . Foot neuroma surgery Right   . Eye lid surgery Bilateral 01/2014   Family History  Problem Relation Age of Onset  . Heart disease Mother   . Stroke Mother   . Cirrhosis Father   . Heart disease Father   . Skin cancer Father   . Colon cancer Neg Hx   . Prostate cancer Maternal Grandfather   . Parkinson's disease Paternal Grandfather   . Heart disease Brother    Social History  Substance Use Topics  . Smoking status: Never Smoker   . Smokeless tobacco: Never Used  . Alcohol Use: No    Review of Systems  10 Systems reviewed and are negative for acute change except as noted in the HPI.    Allergies  Dexilant; Soma; Sulfamethoxazole; and Ivp dye  Home Medications   Prior to Admission medications   Medication Sig Start Date End Date Taking? Authorizing Provider  LORazepam (ATIVAN) 1 MG tablet Take 1 mg by mouth every  8 (eight) hours as needed for anxiety.  04/25/10  Yes Historical Provider, MD  penicillin v potassium (VEETID) 500 MG tablet Take 500 mg by mouth daily. 01/14/15  Yes Historical Provider, MD  AMBULATORY NON FORMULARY MEDICATION Medication Name: Nitroglycerine ointment 0.125.  Apply a pea sized amount in anal canal to the first knuckle three times a day for 4-6 weeks Patient not taking: Reported on 11/29/2014 10/02/14   Jerene Bears, MD  atorvastatin (LIPITOR) 20 MG tablet Take 1 tablet (20 mg total) by mouth daily. Patient not taking: Reported on 11/29/2014 08/14/14   Marcial Pacas, DO  azithromycin (ZITHROMAX) 250 MG tablet Take 1 tablet (250 mg total) by mouth daily. Take first 2 tablets together, then 1 every day until  finished. 01/15/15   Delos Haring, PA-C  erythromycin ophthalmic ointment Place 1 application into the right eye every 6 (six) hours. Place 1/2 inch ribbon of ointment in the affected eye 4 times a day Patient not taking: Reported on 01/14/2015 01/06/15   Comer Locket, PA-C  Linaclotide [facility]) 145 MCG CAPS capsule Take 1 capsule (145 mcg total) by mouth daily. Patient not taking: Reported on 11/29/2014 07/05/14   Jerene Bears, MD  tiZANidine (ZANAFLEX) 4 MG tablet Take 1 tablet (4 mg total) by mouth every 6 (six) hours as needed for muscle spasms. Patient not taking: Reported on 11/29/2014 08/07/14   Sean Hommel, DO   BP 94/63 mmHg  Pulse 92  Temp(Src) 98.7 F (37.1 C) (Oral)  Resp 19  Ht 6' (1.829 m)  Wt 165 lb (74.844 kg)  BMI 22.37 kg/m2  SpO2 89% Physical Exam  Constitutional: He is oriented to person, place, and time. He appears well-developed and well-nourished. No distress.  febrile  HENT:  Head: Normocephalic and atraumatic.  Eyes: Pupils are equal, round, and reactive to light.  Neck: Normal range of motion. Neck supple. No Brudzinski's sign and no Kernig's sign noted.  Cardiovascular: Regular rhythm.  Tachycardia present.   Pulmonary/Chest: Effort normal. No accessory muscle usage. No respiratory distress. He has no decreased breath sounds. He has no wheezes. He has no rhonchi. He has no rales.  Abdominal: Soft. Bowel sounds are normal. He exhibits no distension. There is tenderness. There is no rebound, no guarding, no CVA tenderness and negative Murphy's sign.  Generalized abd pain  Musculoskeletal:  No LE swelling  Neurological: He is alert and oriented to person, place, and time. He has normal strength.  Skin: Skin is warm and dry. No rash noted. He is not diaphoretic.  Nursing note and vitals reviewed.   ED Course  Procedures (including critical care time) Labs Review Labs Reviewed  COMPREHENSIVE METABOLIC PANEL - Abnormal; Notable for the following:     Glucose, Bld 120 (*)    All other components within normal limits  CULTURE, BLOOD (ROUTINE X 2)  CULTURE, BLOOD (ROUTINE X 2)  LIPASE, BLOOD  CBC  URINALYSIS, ROUTINE W REFLEX MICROSCOPIC (NOT AT [facility])  I-STAT CG4 LACTIC ACID, ED  I-STAT CG4 LACTIC ACID, ED    Imaging Review Ct Abdomen Pelvis Wo Contrast  01/14/2015   CLINICAL DATA:  Acute onset of progressive mid abdominal pain and vomiting. Initial encounter.  EXAM: CT ABDOMEN AND PELVIS WITHOUT CONTRAST  TECHNIQUE: Multidetector CT imaging of the abdomen and pelvis was performed following the standard protocol without IV contrast.  COMPARISON:  CT of the abdomen and pelvis performed 11/01/2007, and abdominal ultrasound performed 03/16/2014  FINDINGS: Mild bibasilar atelectasis is noted.  The liver and spleen are unremarkable in appearance. The gallbladder is within normal limits. The pancreas and adrenal glands are unremarkable.  Nonspecific perinephric stranding is noted bilaterally. Scattered nonobstructing left renal stones are seen, measuring up to 6 mm in size. There is no evidence of hydronephrosis. No obstructing ureteral stones are identified.  No free fluid is identified. The small bowel is unremarkable in appearance. The stomach is within normal limits. No acute vascular abnormalities are seen.  The appendix is grossly unremarkable in appearance; there is no evidence of appendicitis. Mild diverticulosis is noted along the ascending colon, without evidence of diverticulitis.  The bladder is mildly distended and grossly unremarkable. The prostate remains normal in size. No inguinal lymphadenopathy is seen.  No acute osseous abnormalities are identified.  IMPRESSION: 1. No acute abnormality seen to explain the patient's symptoms. 2. Scattered nonobstructing left renal stones seen, measuring up to 6 mm in size. 3. Mild bibasilar atelectasis noted. 4. Mild diverticulosis along the ascending colon, without evidence of diverticulitis.    Electronically Signed   By: Garald Balding M.D.   On: 01/14/2015 22:54   Dg Chest 2 View  01/14/2015   CLINICAL DATA:  Acute onset of fever, with generalized abdominal pain, nausea and vomiting. Initial encounter.  EXAM: CHEST  2 VIEW  COMPARISON:  Chest radiograph performed 07/03/2003  FINDINGS: The lungs are well-aerated. Mild left basilar airspace opacity raises concern for pneumonia. There is no evidence of pleural effusion or pneumothorax.  The heart is borderline enlarged. No acute osseous abnormalities are seen.  IMPRESSION: Mild left basilar airspace opacity raises concern for pneumonia. Borderline cardiomegaly.   Electronically Signed   By: Garald Balding M.D.   On: 01/14/2015 23:37   I have personally reviewed and evaluated these images and lab results as part of my medical decision-making.   EKG Interpretation None      MDM   Final diagnoses:  CAP (community acquired pneumonia)    Patients labwork is reassuring. Negative CT abd/pelv. Chest xray shows concern for pneumonia-- temperature and pulse improved with fluids. Patient seen by Dr. Wilson Singer, He feels that the patient should be able to go home. Given Albuteral HFA and breathing treatments His pulse ox was 95% originally but the nurse noted a pulse ox reading at 89 %.on recheck his pulse ox is 94 % and pt denies SOB.    Rx: Azithromycin for home. Pt given strict return to ED precautions.  Medications  acetaminophen (TYLENOL) tablet 650 mg (not administered)  albuterol (PROVENTIL HFA;VENTOLIN HFA) 108 (90 BASE) MCG/ACT inhaler 2 puff (not administered)  ondansetron (ZOFRAN-ODT) disintegrating tablet 4 mg (4 mg Oral Given 01/14/15 2059)  acetaminophen (TYLENOL) tablet 650 mg (650 mg Oral Given 01/14/15 2059)  sodium chloride 0.9 % bolus 1,000 mL (0 mLs Intravenous Stopped 01/14/15 2220)  sodium chloride 0.9 % bolus 1,000 mL (0 mLs Intravenous Stopped 01/14/15 2305)  fentaNYL (SUBLIMAZE) injection 100 mcg (100 mcg Intravenous  Given 01/14/15 2224)  cefTRIAXone (ROCEPHIN) 1 g in dextrose 5 % 50 mL IVPB (0 g Intravenous Stopped 01/15/15 0052)  azithromycin (ZITHROMAX) tablet 500 mg (500 mg Oral Given 01/15/15 0009)  albuterol (PROVENTIL) (2.5 MG/3ML) 0.083% nebulizer solution 5 mg (5 mg Nebulization Given 01/15/15 0052)  ipratropium (ATROVENT) nebulizer solution 0.5 mg (0.5 mg Nebulization Given 01/15/15 0052)    64 y.o.Leafy Kindle [place]'s medical screening exam was performed and I feel the patient has had an appropriate workup for their chief complaint at this time and likelihood  of emergent condition existing is low. They have been counseled on decision, discharge, follow up and which symptoms necessitate immediate return to the emergency department. They or their family verbally stated understanding and agreement with plan and discharged in stable condition.   Vital signs are stable at discharge. Filed Vitals:   01/15/15 0048  BP: 98/63  Pulse: 93  Temp:   Resp: [address], PA-C 01/15/15 0107  Virgel Manifold, MD 01/17/15 1420

## 2015-01-15 ENCOUNTER — Encounter: Payer: Self-pay | Admitting: Family Medicine

## 2015-01-15 ENCOUNTER — Ambulatory Visit (INDEPENDENT_AMBULATORY_CARE_PROVIDER_SITE_OTHER): Payer: Medicare Other | Admitting: Family Medicine

## 2015-01-15 VITALS — BP 104/63 | HR 93 | Temp 99.5°F | Wt 178.0 lb

## 2015-01-15 DIAGNOSIS — J189 Pneumonia, unspecified organism: Secondary | ICD-10-CM

## 2015-01-15 DIAGNOSIS — R1084 Generalized abdominal pain: Secondary | ICD-10-CM | POA: Diagnosis not present

## 2015-01-15 LAB — I-STAT CG4 LACTIC ACID, ED: LACTIC ACID, VENOUS: 0.93 mmol/L (ref 0.5–2.0)

## 2015-01-15 MED ORDER — IPRATROPIUM BROMIDE 0.02 % IN SOLN
0.5000 mg | Freq: Once | RESPIRATORY_TRACT | Status: AC
  Administered 2015-01-15: 0.5 mg via RESPIRATORY_TRACT

## 2015-01-15 MED ORDER — ALBUTEROL SULFATE (2.5 MG/3ML) 0.083% IN NEBU
5.0000 mg | INHALATION_SOLUTION | Freq: Once | RESPIRATORY_TRACT | Status: AC
  Administered 2015-01-15: 5 mg via RESPIRATORY_TRACT

## 2015-01-15 MED ORDER — IPRATROPIUM BROMIDE 0.02 % IN SOLN
0.5000 mg | Freq: Once | RESPIRATORY_TRACT | Status: DC
  Filled 2015-01-15: qty 2.5

## 2015-01-15 MED ORDER — ALBUTEROL SULFATE HFA 108 (90 BASE) MCG/ACT IN AERS: 1.0000 | INHALATION_SPRAY | Freq: Four times a day (QID) | RESPIRATORY_TRACT | Status: DC

## 2015-01-15 MED ORDER — ALBUTEROL SULFATE (2.5 MG/3ML) 0.083% IN NEBU
5.0000 mg | INHALATION_SOLUTION | Freq: Once | RESPIRATORY_TRACT | Status: DC
  Filled 2015-01-15: qty 6

## 2015-01-15 MED ORDER — ALBUTEROL SULFATE HFA 108 (90 BASE) MCG/ACT IN AERS: 2.0000 | INHALATION_SPRAY | RESPIRATORY_TRACT | Status: DC | PRN

## 2015-01-15 MED ORDER — CEFDINIR 300 MG PO CAPS: 300.0000 mg | ORAL_CAPSULE | Freq: Two times a day (BID) | ORAL | Status: AC

## 2015-01-15 MED ORDER — AZITHROMYCIN 250 MG PO TABS: 250.0000 mg | ORAL_TABLET | Freq: Every day | ORAL | Status: DC

## 2015-01-15 NOTE — Discharge Instructions (Signed)

## 2015-01-15 NOTE — Progress Notes (Signed)
CC: David Armstrong is a 64 y.o. male is here for hospital f/u   Subjective: HPI:  On Sunday patient awoke after napping with symptoms of nausea, lightheadedness, fever. He woke up that morning with mild fever but thought he was maybe just getting a virus. Symptoms were bad enough to where he went to a local emergency room and received Rocephin, IV fluids and was given a prescription for azithromycin after a left lobar pneumonia was found. He tells me he feels somewhat better today but a little bit tired. He's had subjective fever but only mild in severity. Nothing seems to make symptoms better or worse that he is aware of. They're present all hours of the day. He has not gotten his appetite back but did not vomit since last night. He was able tolerate some potatoes earlier today. He denies chills, confusion, abdominal pain, pain with breathing or cough. Denies shortness of breath or wheezing. He has had some pain on the right posterior chest wall when lying on his right side however the pneumonia was found to be on the left.   Review Of Systems Outlined In HPI  Past Medical History  Diagnosis Date  . ANXIETY   . Personal history of colonic polyps 10/27/2007    HYPERPLASTIC POLYP  . DEPRESSION   . DIVERTICULOSIS, COLON [phone]    Colonoscopy  . GERD   . HYPERLIPIDEMIA   . RENAL CALCULUS   . IBS (irritable bowel syndrome)   . Hiatal hernia [id number]    EGD  . H. pylori infection   . Arthritis   . Anal fissure   . TMJ (temporomandibular joint syndrome)   . Unspecified gastritis and gastroduodenitis without mention of hemorrhage 2009    EGD  . Hemorrhoids [id number]    Colonoscopy   . Peripheral neuropathy ([place])   . Fatty liver   . Internal hemorrhoids     Past Surgical History  Procedure Laterality Date  . Knee surgery Left   . Shoulder surgery Bilateral 07/2005, 03/2011    left x 2  . Heel spur surgery Bilateral   . Hammer toe surgery Left   . Lithotripsy    . Patella  arthroplasty Right 07/17/2003  . Partial knee arthroplasty Right   . Foot neuroma surgery Right   . Eye lid surgery Bilateral 01/2014   Family History  Problem Relation Age of Onset  . Heart disease Mother   . Stroke Mother   . Cirrhosis Father   . Heart disease Father   . Skin cancer Father   . Colon cancer Neg Hx   . Prostate cancer Maternal Grandfather   . Parkinson's disease Paternal Grandfather   . Heart disease Brother     Social History   Social History  . Marital Status: Single    Spouse Name: N/A  . Number of Children: 0  . Years of Education: N/A   Occupational History  . retired [employer]    disabled now   Social History Main Topics  . Smoking status: Never Smoker   . Smokeless tobacco: Never Used  . Alcohol Use: No  . Drug Use: No  . Sexual Activity: No   Other Topics Concern  . Not on file   Social History Narrative     Objective: BP 104/63 mmHg  Pulse 93  Temp(Src) 99.5 F (37.5 C) (Oral)  Wt 178 lb (80.74 kg)  SpO2 99%  General: Alert and Oriented, No Acute Distress HEENT: Pupils equal, round,  reactive to light. Conjunctivae clear.  External ears unremarkable, canals clear with intact TMs with appropriate landmarks.  Middle ear appears open without effusion. Pink inferior turbinates.  Moist mucous membranes, pharynx without inflammation nor lesions.  Neck supple without palpable lymphadenopathy nor abnormal masses. Lungs: Clear to auscultation bilaterally, no wheezing/ronchi/rales.  Comfortable work of breathing. Good air movement. Cardiac: Regular rate and rhythm. Extremities: No peripheral edema.  Strong peripheral pulses.  Mental Status: No depression, anxiety, nor agitation. Skin: Warm and dry.  Assessment & Plan: Ademide was seen today for hospital f/u.  Diagnoses and all orders for this visit:  CAP (community acquired pneumonia) -     cefdinir (OMNICEF) 300 MG capsule; Take 1 capsule (300 mg total) by mouth 2 (two) times  daily.   Community acquired pneumonia, since it's lobar I would like him to also start on [place] for better coverage of strep pneumonia strains. Continue azithromycin. Discussed it's unlikely for him to feel significantly better until Wednesday if he starts the antibiotic today.  25 minutes spent face-to-face during visit today of which at least 50% was counseling or coordinating care regarding: 1. CAP (community acquired pneumonia)   And reviewing hospital records in the presence of the patient   Return if symptoms worsen or fail to improve.

## 2015-01-19 ENCOUNTER — Encounter: Payer: Self-pay | Admitting: Family Medicine

## 2015-01-19 ENCOUNTER — Ambulatory Visit (INDEPENDENT_AMBULATORY_CARE_PROVIDER_SITE_OTHER): Payer: Medicare Other | Admitting: Family Medicine

## 2015-01-19 VITALS — BP 106/71 | HR 80 | Temp 97.7°F | Resp 16 | Ht 72.0 in | Wt 172.0 lb

## 2015-01-19 DIAGNOSIS — J189 Pneumonia, unspecified organism: Secondary | ICD-10-CM

## 2015-01-19 DIAGNOSIS — H9202 Otalgia, left ear: Secondary | ICD-10-CM | POA: Diagnosis not present

## 2015-01-19 DIAGNOSIS — H6121 Impacted cerumen, right ear: Secondary | ICD-10-CM | POA: Insufficient documentation

## 2015-01-19 MED ORDER — NEOMYCIN-POLYMYXIN-HC 1 % OT SOLN: 3.0000 [drp] | Freq: Four times a day (QID) | OTIC | Status: DC

## 2015-01-19 NOTE — Progress Notes (Signed)
David Armstrong is a 64 y.o. male who presents to [place]: [facility]  today for follow-up pneumonia. Patient was seen on Sunday the emergency department and diagnosed with pneumonia. He followed up with his PCP. At this point patient is taking azithromycin and Omnicef. He states that his lungs are feeling reasonably well. He denies any significant trouble breathing fevers or chills. Overall he feels mildly ill. Additionally notes left ear pain and left scalp pain. He has had similar spell pain in the past. He denies any lightheadedness weakness or numbness. He has not tried any new medications. Left ear pain present for 3-4 days.  Past Medical History  Diagnosis Date  . ANXIETY   . Personal history of colonic polyps 10/27/2007    HYPERPLASTIC POLYP  . DEPRESSION   . DIVERTICULOSIS, COLON [phone]    Colonoscopy  . GERD   . HYPERLIPIDEMIA   . RENAL CALCULUS   . IBS (irritable bowel syndrome)   . Hiatal hernia [id number]    EGD  . H. pylori infection   . Arthritis   . Anal fissure   . TMJ (temporomandibular joint syndrome)   . Unspecified gastritis and gastroduodenitis without mention of hemorrhage 2009    EGD  . Hemorrhoids [id number]    Colonoscopy   . Peripheral neuropathy ([place])   . Fatty liver   . Internal hemorrhoids    Past Surgical History  Procedure Laterality Date  . Knee surgery Left   . Shoulder surgery Bilateral 07/2005, 03/2011    left x 2  . Heel spur surgery Bilateral   . Hammer toe surgery Left   . Lithotripsy    . 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 Partial knee arthroplasty Right   . Foot neuroma surgery Right   . Eye lid surgery Bilateral 01/2014   Social History  Substance Use Topics  . Smoking status: Never Smoker   . Smokeless tobacco: Never Used  . Alcohol Use: No   family history includes Cirrhosis in his father; Heart disease in his brother, father, and mother; Parkinson's disease in his paternal grandfather;  Prostate cancer in his maternal grandfather; Skin cancer in his father; Stroke in his mother. There is no history of Colon cancer.  ROS as above Medications: Current Outpatient Prescriptions  Medication Sig Dispense Refill  . atorvastatin (LIPITOR) 20 MG tablet Take 1 tablet (20 mg total) by mouth daily. 90 tablet 3  . azithromycin (ZITHROMAX) 250 MG tablet Take 1 tablet (250 mg total) by mouth daily. Take first 2 tablets together, then 1 every day until finished. 6 tablet 0  . cefdinir (OMNICEF) 300 MG capsule Take 1 capsule (300 mg total) by mouth 2 (two) times daily. 20 capsule 0  . Linaclotide (LINZESS) 145 MCG CAPS capsule Take 1 capsule (145 mcg total) by mouth daily. 30 capsule 2  . LORazepam (ATIVAN) 1 MG tablet Take 1 mg by mouth every 8 (eight) hours as needed for anxiety.     . NEOMYCIN-POLYMYXIN-HYDROCORTISONE (CORTISPORIN) 1 % SOLN otic solution Place 3 drops into the left ear 4 (four) times daily. 10 mL 0  . tiZANidine (ZANAFLEX) 4 MG tablet Take 1 tablet (4 mg total) by mouth every 6 (six) hours as needed for muscle spasms. 30 tablet 2   No current facility-administered medications for this visit.   Allergies  Allergen Reactions  . Dexilant [Dexlansoprazole] Other (See Comments)    Muscle aches  . Soma [Carisoprodol]     Sores on arm  .  Sulfamethoxazole     REACTION: unspecified  . Ivp Dye [Iodinated Diagnostic Agents] Rash     Exam:  BP 106/71 mmHg  Pulse 80  Temp(Src) 97.7 F (36.5 C) (Oral)  Resp 16  Ht 6' (1.829 m)  Wt 172 lb (78.019 kg)  BMI 23.32 kg/m2  SpO2 99% Gen: Well NAD HEENT: EOMI,  MMM mild left ear canal erythema. Tympanic membranes normal. No scalp lesions. Right ear canal and tympanic membranes are normal. Lungs: Normal work of breathing. CTABL Heart: RRR no MRG Abd: NABS, Soft. Nondistended, Nontender Exts: Brisk capillary refill, warm and well perfused.  Skin: No rash  No results found for this or any previous visit (from the past 24  hour(s)). No results found.   Please see individual assessment and plan sections.

## 2015-01-19 NOTE — Patient Instructions (Signed)
Thank you for coming in today. Return if not better.  Call or go to the emergency room if you get worse, have trouble breathing, have chest pains, or palpitations.  Use the drops as needed.

## 2015-01-19 NOTE — Assessment & Plan Note (Signed)
Subacute otitis externa. Treat with Cortisporin drops. Watchful waiting. Doubtful for shingles as symptoms present now for 3-4 days without rash.

## 2015-01-19 NOTE — Assessment & Plan Note (Signed)
Doing well. Continue dual therapy of azithromycin and Omnicef.

## 2015-01-20 LAB — CULTURE, BLOOD (ROUTINE X 2)
CULTURE: NO GROWTH
CULTURE: NO GROWTH

## 2015-01-23 ENCOUNTER — Other Ambulatory Visit: Payer: Self-pay | Admitting: Physician Assistant

## 2015-01-25 DIAGNOSIS — M25572 Pain in left ankle and joints of left foot: Secondary | ICD-10-CM | POA: Diagnosis not present

## 2015-01-25 DIAGNOSIS — M7752 Other enthesopathy of left foot: Secondary | ICD-10-CM | POA: Diagnosis not present

## 2015-02-08 DIAGNOSIS — H0015 Chalazion left lower eyelid: Secondary | ICD-10-CM | POA: Diagnosis not present

## 2015-02-14 DIAGNOSIS — J310 Chronic rhinitis: Secondary | ICD-10-CM | POA: Diagnosis not present

## 2015-02-14 DIAGNOSIS — R6884 Jaw pain: Secondary | ICD-10-CM | POA: Diagnosis not present

## 2015-02-14 DIAGNOSIS — R51 Headache: Secondary | ICD-10-CM | POA: Diagnosis not present

## 2015-03-02 DIAGNOSIS — G5622 Lesion of ulnar nerve, left upper limb: Secondary | ICD-10-CM | POA: Diagnosis not present

## 2015-03-02 DIAGNOSIS — G603 Idiopathic progressive neuropathy: Secondary | ICD-10-CM | POA: Diagnosis not present

## 2015-03-02 DIAGNOSIS — G5603 Carpal tunnel syndrome, bilateral upper limbs: Secondary | ICD-10-CM | POA: Diagnosis not present

## 2015-03-02 DIAGNOSIS — M542 Cervicalgia: Secondary | ICD-10-CM | POA: Diagnosis not present

## 2015-03-02 DIAGNOSIS — M5481 Occipital neuralgia: Secondary | ICD-10-CM | POA: Diagnosis not present

## 2015-03-02 DIAGNOSIS — R200 Anesthesia of skin: Secondary | ICD-10-CM | POA: Diagnosis not present

## 2015-03-02 DIAGNOSIS — M5417 Radiculopathy, lumbosacral region: Secondary | ICD-10-CM | POA: Diagnosis not present

## 2015-03-02 DIAGNOSIS — M5412 Radiculopathy, cervical region: Secondary | ICD-10-CM | POA: Diagnosis not present

## 2015-03-05 DIAGNOSIS — G5762 Lesion of plantar nerve, left lower limb: Secondary | ICD-10-CM | POA: Diagnosis not present

## 2015-03-05 DIAGNOSIS — M7752 Other enthesopathy of left foot: Secondary | ICD-10-CM | POA: Diagnosis not present

## 2015-03-19 DIAGNOSIS — M7751 Other enthesopathy of right foot: Secondary | ICD-10-CM | POA: Diagnosis not present

## 2015-03-19 DIAGNOSIS — G5761 Lesion of plantar nerve, right lower limb: Secondary | ICD-10-CM | POA: Diagnosis not present

## 2015-03-20 ENCOUNTER — Telehealth: Payer: Self-pay | Admitting: Internal Medicine

## 2015-03-20 NOTE — Telephone Encounter (Signed)
Discussed with pt and he states he is having acid reflux symptoms with gurgling and burning in his throat. Asked pt if he was taking his zantac at night and he stated he didn't know he had refills on the prescription. Pt states he will call the pharmacy for a refill on the zantac.

## 2015-03-26 ENCOUNTER — Ambulatory Visit (INDEPENDENT_AMBULATORY_CARE_PROVIDER_SITE_OTHER): Payer: Medicare Other

## 2015-03-26 ENCOUNTER — Encounter: Payer: Self-pay | Admitting: Family Medicine

## 2015-03-26 ENCOUNTER — Ambulatory Visit (INDEPENDENT_AMBULATORY_CARE_PROVIDER_SITE_OTHER): Payer: Medicare Other | Admitting: Family Medicine

## 2015-03-26 VITALS — BP 139/87 | HR 104 | Temp 98.3°F | Wt 173.0 lb

## 2015-03-26 DIAGNOSIS — R059 Cough, unspecified: Secondary | ICD-10-CM

## 2015-03-26 DIAGNOSIS — R509 Fever, unspecified: Secondary | ICD-10-CM

## 2015-03-26 DIAGNOSIS — R05 Cough: Secondary | ICD-10-CM

## 2015-03-26 LAB — POC INFLUENZA A&B (BINAX/QUICKVUE)
INFLUENZA A, POC: NEGATIVE
Influenza B, POC: NEGATIVE

## 2015-03-26 MED ORDER — HYDROCODONE-HOMATROPINE 5-1.5 MG/5ML PO SYRP: 5.0000 mL | ORAL_SOLUTION | Freq: Three times a day (TID) | ORAL | Status: DC | PRN

## 2015-03-26 MED ORDER — PANTOPRAZOLE SODIUM 40 MG PO TBEC: 40.0000 mg | DELAYED_RELEASE_TABLET | Freq: Every day | ORAL | Status: DC

## 2015-03-26 NOTE — Progress Notes (Signed)
CC: David Armstrong is a 64 y.o. male is here for No chief complaint on file.   Subjective: HPI:  Productive cough and fever for the past 3 days. Interventions include penicillin x2 days.  Cough wakes him up at night.  Symptoms are persistent and moderate in severity.   Nothing seems to make symptoms better or worse.  Accompanied by mild body aches.  overall symptoms are persistent and moderate severity. nothing seems to make symptoms better or worse. Denies fevers, chills, wheezing, shortness of breath or chest discomfort.  He wants to her something stronger he can take other than ranitidine for reflux symptoms. This been going on for a matter of months now.  Review Of Systems Outlined In HPI  Past Medical History  Diagnosis Date  . ANXIETY   . Personal history of colonic polyps 10/27/2007    HYPERPLASTIC POLYP  . DEPRESSION   . DIVERTICULOSIS, COLON [phone]    Colonoscopy  . GERD   . HYPERLIPIDEMIA   . RENAL CALCULUS   . IBS (irritable bowel syndrome)   . Hiatal hernia [id number]    EGD  . H. pylori infection   . Arthritis   . Anal fissure   . TMJ (temporomandibular joint syndrome)   . Unspecified gastritis and gastroduodenitis without mention of hemorrhage 2009    EGD  . Hemorrhoids [id number]    Colonoscopy   . Peripheral neuropathy ([place])   . Fatty liver   . Internal hemorrhoids     Past Surgical History  Procedure Laterality Date  . Knee surgery Left   . Shoulder surgery Bilateral 07/2005, 03/2011    left x 2  . Heel spur surgery Bilateral   . Hammer toe surgery Left   . Lithotripsy    . 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 Partial knee arthroplasty Right   . Foot neuroma surgery Right   . Eye lid surgery Bilateral 01/2014   Family History  Problem Relation Age of Onset  . Heart disease Mother   . Stroke Mother   . Cirrhosis Father   . Heart disease Father   . Skin cancer Father   . Colon cancer Neg Hx   . Prostate cancer Maternal Grandfather   .  Parkinson's disease Paternal Grandfather   . Heart disease Brother     Social History   Social History  . Marital Status: Single    Spouse Name: N/A  . Number of Children: 0  . Years of Education: N/A   Occupational History  . retired [employer]    disabled now   Social History Main Topics  . Smoking status: Never Smoker   . Smokeless tobacco: Never Used  . Alcohol Use: No  . Drug Use: No  . Sexual Activity: No   Other Topics Concern  . Not on file   Social History Narrative     Objective: BP 139/87 mmHg  Pulse 104  Temp(Src) 98.3 F (36.8 C) (Oral)  Wt 173 lb (78.472 kg)  General: Alert and Oriented, No Acute Distress HEENT: Pupils equal, round, reactive to light. Conjunctivae clear.  External ears unremarkable, canals clear with intact TMs with appropriate landmarks.  Middle ear appears open without effusion. Pink inferior turbinates.  Moist mucous membranes, pharynx without inflammation nor lesions.  Neck supple without palpable lymphadenopathy nor abnormal masses. Lungs: Clear to auscultation bilaterally, no wheezing/ronchi/rales.  Comfortable work of breathing. Good air movement. Cardiac: Regular rate and rhythm. Normal S1/S2.  No murmurs, rubs, nor gallops.  Extremities: No peripheral edema.  Strong peripheral pulses.  Mental Status: No depression, anxiety, nor agitation. Skin: Warm and dry.  Assessment & Plan: Diagnoses and all orders for this visit:  Fever, unspecified -     DG Chest 2 View; Future -     POC Influenza A&B (Binax test)  Cough -     DG Chest 2 View; Future -     HYDROcodone-homatropine (HYCODAN) 5-1.5 MG/5ML syrup; Take 5 mLs by mouth every 8 (eight) hours as needed for cough.  Other orders -     pantoprazole (PROTONIX) 40 MG tablet; Take 1 tablet (40 mg total) by mouth daily.   CXR obtained to rule out pneumonia, fortunately this was normal.  Most likely has a common viral respiratory illness that should pass later this week. Flu  test normal. PRN cough syrup and begin protonix for uncontrolled chronic GERD  Return if symptoms worsen or fail to improve.

## 2015-03-29 ENCOUNTER — Telehealth: Payer: Self-pay | Admitting: Family Medicine

## 2015-03-29 ENCOUNTER — Ambulatory Visit: Payer: Medicare Other | Admitting: Family Medicine

## 2015-03-29 DIAGNOSIS — L720 Epidermal cyst: Secondary | ICD-10-CM | POA: Diagnosis not present

## 2015-03-29 MED ORDER — PREDNISONE 20 MG PO TABS
ORAL_TABLET | ORAL | Status: AC
Start: 2015-03-29 — End: 2015-04-03

## 2015-03-29 MED ORDER — LEVOFLOXACIN 500 MG PO TABS: 500.0000 mg | ORAL_TABLET | Freq: Every day | ORAL | Status: DC

## 2015-03-29 NOTE — Telephone Encounter (Signed)
Cough not improving, start levaquin and prednisone

## 2015-04-02 DIAGNOSIS — G5762 Lesion of plantar nerve, left lower limb: Secondary | ICD-10-CM | POA: Diagnosis not present

## 2015-04-02 DIAGNOSIS — M7752 Other enthesopathy of left foot: Secondary | ICD-10-CM | POA: Diagnosis not present

## 2015-04-03 DIAGNOSIS — J310 Chronic rhinitis: Secondary | ICD-10-CM | POA: Diagnosis not present

## 2015-04-03 DIAGNOSIS — R51 Headache: Secondary | ICD-10-CM | POA: Diagnosis not present

## 2015-04-05 DIAGNOSIS — D485 Neoplasm of uncertain behavior of skin: Secondary | ICD-10-CM | POA: Diagnosis not present

## 2015-04-10 DIAGNOSIS — D2312 Other benign neoplasm of skin of left eyelid, including canthus: Secondary | ICD-10-CM | POA: Diagnosis not present

## 2015-04-11 DIAGNOSIS — R51 Headache: Secondary | ICD-10-CM | POA: Diagnosis not present

## 2015-04-13 DIAGNOSIS — M7751 Other enthesopathy of right foot: Secondary | ICD-10-CM | POA: Diagnosis not present

## 2015-04-13 DIAGNOSIS — G5761 Lesion of plantar nerve, right lower limb: Secondary | ICD-10-CM | POA: Diagnosis not present

## 2015-04-16 DIAGNOSIS — M9902 Segmental and somatic dysfunction of thoracic region: Secondary | ICD-10-CM | POA: Diagnosis not present

## 2015-04-16 DIAGNOSIS — M9903 Segmental and somatic dysfunction of lumbar region: Secondary | ICD-10-CM | POA: Diagnosis not present

## 2015-04-16 DIAGNOSIS — M5430 Sciatica, unspecified side: Secondary | ICD-10-CM | POA: Diagnosis not present

## 2015-04-16 DIAGNOSIS — M9904 Segmental and somatic dysfunction of sacral region: Secondary | ICD-10-CM | POA: Diagnosis not present

## 2015-04-16 DIAGNOSIS — M25562 Pain in left knee: Secondary | ICD-10-CM | POA: Diagnosis not present

## 2015-04-18 DIAGNOSIS — M5430 Sciatica, unspecified side: Secondary | ICD-10-CM | POA: Diagnosis not present

## 2015-04-18 DIAGNOSIS — M9902 Segmental and somatic dysfunction of thoracic region: Secondary | ICD-10-CM | POA: Diagnosis not present

## 2015-04-18 DIAGNOSIS — M9903 Segmental and somatic dysfunction of lumbar region: Secondary | ICD-10-CM | POA: Diagnosis not present

## 2015-04-18 DIAGNOSIS — M25562 Pain in left knee: Secondary | ICD-10-CM | POA: Diagnosis not present

## 2015-04-18 DIAGNOSIS — M9904 Segmental and somatic dysfunction of sacral region: Secondary | ICD-10-CM | POA: Diagnosis not present

## 2015-04-19 DIAGNOSIS — M9902 Segmental and somatic dysfunction of thoracic region: Secondary | ICD-10-CM | POA: Diagnosis not present

## 2015-04-19 DIAGNOSIS — M25562 Pain in left knee: Secondary | ICD-10-CM | POA: Diagnosis not present

## 2015-04-19 DIAGNOSIS — M9904 Segmental and somatic dysfunction of sacral region: Secondary | ICD-10-CM | POA: Diagnosis not present

## 2015-04-19 DIAGNOSIS — M5430 Sciatica, unspecified side: Secondary | ICD-10-CM | POA: Diagnosis not present

## 2015-04-19 DIAGNOSIS — M9903 Segmental and somatic dysfunction of lumbar region: Secondary | ICD-10-CM | POA: Diagnosis not present

## 2015-04-20 ENCOUNTER — Encounter: Payer: Self-pay | Admitting: Gastroenterology

## 2015-04-25 DIAGNOSIS — M5430 Sciatica, unspecified side: Secondary | ICD-10-CM | POA: Diagnosis not present

## 2015-04-25 DIAGNOSIS — M9902 Segmental and somatic dysfunction of thoracic region: Secondary | ICD-10-CM | POA: Diagnosis not present

## 2015-04-25 DIAGNOSIS — M9904 Segmental and somatic dysfunction of sacral region: Secondary | ICD-10-CM | POA: Diagnosis not present

## 2015-04-25 DIAGNOSIS — M9903 Segmental and somatic dysfunction of lumbar region: Secondary | ICD-10-CM | POA: Diagnosis not present

## 2015-04-25 DIAGNOSIS — M25562 Pain in left knee: Secondary | ICD-10-CM | POA: Diagnosis not present

## 2015-04-27 DIAGNOSIS — M25571 Pain in right ankle and joints of right foot: Secondary | ICD-10-CM | POA: Diagnosis not present

## 2015-04-27 DIAGNOSIS — M7671 Peroneal tendinitis, right leg: Secondary | ICD-10-CM | POA: Diagnosis not present

## 2015-04-30 DIAGNOSIS — M25562 Pain in left knee: Secondary | ICD-10-CM | POA: Diagnosis not present

## 2015-04-30 DIAGNOSIS — M9902 Segmental and somatic dysfunction of thoracic region: Secondary | ICD-10-CM | POA: Diagnosis not present

## 2015-04-30 DIAGNOSIS — M5430 Sciatica, unspecified side: Secondary | ICD-10-CM | POA: Diagnosis not present

## 2015-04-30 DIAGNOSIS — M9903 Segmental and somatic dysfunction of lumbar region: Secondary | ICD-10-CM | POA: Diagnosis not present

## 2015-04-30 DIAGNOSIS — M9904 Segmental and somatic dysfunction of sacral region: Secondary | ICD-10-CM | POA: Diagnosis not present

## 2015-05-03 DIAGNOSIS — M5430 Sciatica, unspecified side: Secondary | ICD-10-CM | POA: Diagnosis not present

## 2015-05-03 DIAGNOSIS — M9904 Segmental and somatic dysfunction of sacral region: Secondary | ICD-10-CM | POA: Diagnosis not present

## 2015-05-03 DIAGNOSIS — M25562 Pain in left knee: Secondary | ICD-10-CM | POA: Diagnosis not present

## 2015-05-03 DIAGNOSIS — M9903 Segmental and somatic dysfunction of lumbar region: Secondary | ICD-10-CM | POA: Diagnosis not present

## 2015-05-03 DIAGNOSIS — M9902 Segmental and somatic dysfunction of thoracic region: Secondary | ICD-10-CM | POA: Diagnosis not present

## 2015-05-09 DIAGNOSIS — M9902 Segmental and somatic dysfunction of thoracic region: Secondary | ICD-10-CM | POA: Diagnosis not present

## 2015-05-09 DIAGNOSIS — M9904 Segmental and somatic dysfunction of sacral region: Secondary | ICD-10-CM | POA: Diagnosis not present

## 2015-05-09 DIAGNOSIS — M25562 Pain in left knee: Secondary | ICD-10-CM | POA: Diagnosis not present

## 2015-05-09 DIAGNOSIS — M9903 Segmental and somatic dysfunction of lumbar region: Secondary | ICD-10-CM | POA: Diagnosis not present

## 2015-05-09 DIAGNOSIS — M5430 Sciatica, unspecified side: Secondary | ICD-10-CM | POA: Diagnosis not present

## 2015-05-11 DIAGNOSIS — M65871 Other synovitis and tenosynovitis, right ankle and foot: Secondary | ICD-10-CM | POA: Diagnosis not present

## 2015-05-11 DIAGNOSIS — M25571 Pain in right ankle and joints of right foot: Secondary | ICD-10-CM | POA: Diagnosis not present

## 2015-05-14 DIAGNOSIS — M9904 Segmental and somatic dysfunction of sacral region: Secondary | ICD-10-CM | POA: Diagnosis not present

## 2015-05-14 DIAGNOSIS — M25562 Pain in left knee: Secondary | ICD-10-CM | POA: Diagnosis not present

## 2015-05-14 DIAGNOSIS — M9902 Segmental and somatic dysfunction of thoracic region: Secondary | ICD-10-CM | POA: Diagnosis not present

## 2015-05-14 DIAGNOSIS — M5430 Sciatica, unspecified side: Secondary | ICD-10-CM | POA: Diagnosis not present

## 2015-05-14 DIAGNOSIS — M9903 Segmental and somatic dysfunction of lumbar region: Secondary | ICD-10-CM | POA: Diagnosis not present

## 2015-05-16 DIAGNOSIS — M9904 Segmental and somatic dysfunction of sacral region: Secondary | ICD-10-CM | POA: Diagnosis not present

## 2015-05-16 DIAGNOSIS — M9902 Segmental and somatic dysfunction of thoracic region: Secondary | ICD-10-CM | POA: Diagnosis not present

## 2015-05-16 DIAGNOSIS — M25562 Pain in left knee: Secondary | ICD-10-CM | POA: Diagnosis not present

## 2015-05-16 DIAGNOSIS — M9903 Segmental and somatic dysfunction of lumbar region: Secondary | ICD-10-CM | POA: Diagnosis not present

## 2015-05-16 DIAGNOSIS — M5430 Sciatica, unspecified side: Secondary | ICD-10-CM | POA: Diagnosis not present

## 2015-05-21 DIAGNOSIS — M25562 Pain in left knee: Secondary | ICD-10-CM | POA: Diagnosis not present

## 2015-05-21 DIAGNOSIS — M9904 Segmental and somatic dysfunction of sacral region: Secondary | ICD-10-CM | POA: Diagnosis not present

## 2015-05-21 DIAGNOSIS — M9903 Segmental and somatic dysfunction of lumbar region: Secondary | ICD-10-CM | POA: Diagnosis not present

## 2015-05-21 DIAGNOSIS — M9902 Segmental and somatic dysfunction of thoracic region: Secondary | ICD-10-CM | POA: Diagnosis not present

## 2015-05-21 DIAGNOSIS — M5430 Sciatica, unspecified side: Secondary | ICD-10-CM | POA: Diagnosis not present

## 2015-05-23 DIAGNOSIS — M5430 Sciatica, unspecified side: Secondary | ICD-10-CM | POA: Diagnosis not present

## 2015-05-23 DIAGNOSIS — M25562 Pain in left knee: Secondary | ICD-10-CM | POA: Diagnosis not present

## 2015-05-23 DIAGNOSIS — M9904 Segmental and somatic dysfunction of sacral region: Secondary | ICD-10-CM | POA: Diagnosis not present

## 2015-05-23 DIAGNOSIS — M9903 Segmental and somatic dysfunction of lumbar region: Secondary | ICD-10-CM | POA: Diagnosis not present

## 2015-05-23 DIAGNOSIS — M9902 Segmental and somatic dysfunction of thoracic region: Secondary | ICD-10-CM | POA: Diagnosis not present

## 2015-05-24 DIAGNOSIS — M792 Neuralgia and neuritis, unspecified: Secondary | ICD-10-CM | POA: Diagnosis not present

## 2015-05-24 DIAGNOSIS — G609 Hereditary and idiopathic neuropathy, unspecified: Secondary | ICD-10-CM | POA: Diagnosis not present

## 2015-05-28 DIAGNOSIS — M9903 Segmental and somatic dysfunction of lumbar region: Secondary | ICD-10-CM | POA: Diagnosis not present

## 2015-05-28 DIAGNOSIS — M5430 Sciatica, unspecified side: Secondary | ICD-10-CM | POA: Diagnosis not present

## 2015-05-28 DIAGNOSIS — M9902 Segmental and somatic dysfunction of thoracic region: Secondary | ICD-10-CM | POA: Diagnosis not present

## 2015-05-28 DIAGNOSIS — M9904 Segmental and somatic dysfunction of sacral region: Secondary | ICD-10-CM | POA: Diagnosis not present

## 2015-05-28 DIAGNOSIS — M25562 Pain in left knee: Secondary | ICD-10-CM | POA: Diagnosis not present

## 2015-05-30 DIAGNOSIS — M9902 Segmental and somatic dysfunction of thoracic region: Secondary | ICD-10-CM | POA: Diagnosis not present

## 2015-05-30 DIAGNOSIS — M9903 Segmental and somatic dysfunction of lumbar region: Secondary | ICD-10-CM | POA: Diagnosis not present

## 2015-05-30 DIAGNOSIS — M25562 Pain in left knee: Secondary | ICD-10-CM | POA: Diagnosis not present

## 2015-05-30 DIAGNOSIS — M5430 Sciatica, unspecified side: Secondary | ICD-10-CM | POA: Diagnosis not present

## 2015-05-30 DIAGNOSIS — M9904 Segmental and somatic dysfunction of sacral region: Secondary | ICD-10-CM | POA: Diagnosis not present

## 2015-05-31 DIAGNOSIS — G603 Idiopathic progressive neuropathy: Secondary | ICD-10-CM | POA: Diagnosis not present

## 2015-05-31 DIAGNOSIS — M5481 Occipital neuralgia: Secondary | ICD-10-CM | POA: Diagnosis not present

## 2015-05-31 DIAGNOSIS — M5412 Radiculopathy, cervical region: Secondary | ICD-10-CM | POA: Diagnosis not present

## 2015-05-31 DIAGNOSIS — M5417 Radiculopathy, lumbosacral region: Secondary | ICD-10-CM | POA: Diagnosis not present

## 2015-05-31 DIAGNOSIS — M542 Cervicalgia: Secondary | ICD-10-CM | POA: Diagnosis not present

## 2015-06-04 DIAGNOSIS — M9902 Segmental and somatic dysfunction of thoracic region: Secondary | ICD-10-CM | POA: Diagnosis not present

## 2015-06-04 DIAGNOSIS — M9904 Segmental and somatic dysfunction of sacral region: Secondary | ICD-10-CM | POA: Diagnosis not present

## 2015-06-04 DIAGNOSIS — M5430 Sciatica, unspecified side: Secondary | ICD-10-CM | POA: Diagnosis not present

## 2015-06-04 DIAGNOSIS — M25562 Pain in left knee: Secondary | ICD-10-CM | POA: Diagnosis not present

## 2015-06-04 DIAGNOSIS — M9903 Segmental and somatic dysfunction of lumbar region: Secondary | ICD-10-CM | POA: Diagnosis not present

## 2015-06-06 DIAGNOSIS — G5761 Lesion of plantar nerve, right lower limb: Secondary | ICD-10-CM | POA: Diagnosis not present

## 2015-06-06 DIAGNOSIS — M25571 Pain in right ankle and joints of right foot: Secondary | ICD-10-CM | POA: Diagnosis not present

## 2015-06-11 DIAGNOSIS — M25562 Pain in left knee: Secondary | ICD-10-CM | POA: Diagnosis not present

## 2015-06-11 DIAGNOSIS — M9902 Segmental and somatic dysfunction of thoracic region: Secondary | ICD-10-CM | POA: Diagnosis not present

## 2015-06-11 DIAGNOSIS — M5430 Sciatica, unspecified side: Secondary | ICD-10-CM | POA: Diagnosis not present

## 2015-06-11 DIAGNOSIS — M9904 Segmental and somatic dysfunction of sacral region: Secondary | ICD-10-CM | POA: Diagnosis not present

## 2015-06-11 DIAGNOSIS — M9903 Segmental and somatic dysfunction of lumbar region: Secondary | ICD-10-CM | POA: Diagnosis not present

## 2015-07-02 ENCOUNTER — Encounter: Payer: Self-pay | Admitting: Family Medicine

## 2015-07-02 ENCOUNTER — Ambulatory Visit (INDEPENDENT_AMBULATORY_CARE_PROVIDER_SITE_OTHER): Payer: Medicare Other | Admitting: Family Medicine

## 2015-07-02 VITALS — BP 114/78 | HR 90 | Temp 97.5°F | Wt 182.0 lb

## 2015-07-02 DIAGNOSIS — R1011 Right upper quadrant pain: Secondary | ICD-10-CM | POA: Diagnosis not present

## 2015-07-02 NOTE — Progress Notes (Signed)
CC: David Armstrong is a 65 y.o. male is here for stomach pain x 2  weeks   Subjective: HPI:  2-4 weeks of right upper quadrant pain described as a stabbing sensation. It is nonradiating. It comes and goes throughout the day and is worse with food. Nothing else seems to make it better or worse. No interventions as of yet other than taking Protonix which did not help. Denies any nausea but has a sensation of regurgitation every once in a while. Symptoms have been present on a daily basis since onset. Denies constipation, diarrhea, lack of appetite fevers nor chills. Symptoms are moderate in severity at worst   Review Of Systems Outlined In HPI  Past Medical History  Diagnosis Date  . ANXIETY   . Personal history of colonic polyps 10/27/2007    HYPERPLASTIC POLYP  . DEPRESSION   . DIVERTICULOSIS, COLON [phone]    Colonoscopy  . GERD   . HYPERLIPIDEMIA   . RENAL CALCULUS   . IBS (irritable bowel syndrome)   . Hiatal hernia [id number]    EGD  . H. pylori infection   . Arthritis   . Anal fissure   . TMJ (temporomandibular joint syndrome)   . Unspecified gastritis and gastroduodenitis without mention of hemorrhage 2009    EGD  . Hemorrhoids [id number]    Colonoscopy   . Peripheral neuropathy ([place])   . Fatty liver   . Internal hemorrhoids     Past Surgical History  Procedure Laterality Date  . Knee surgery Left   . Shoulder surgery Bilateral 07/2005, 03/2011    left x 2  . Heel spur surgery Bilateral   . Hammer toe surgery Left   . Lithotripsy    . 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 Partial knee arthroplasty Right   . Foot neuroma surgery Right   . Eye lid surgery Bilateral 01/2014   Family History  Problem Relation Age of Onset  . Heart disease Mother   . Stroke Mother   . Cirrhosis Father   . Heart disease Father   . Skin cancer Father   . Colon cancer Neg Hx   . Prostate cancer Maternal Grandfather   . Parkinson's disease Paternal Grandfather   . Heart  disease Brother     Social History   Social History  . Marital Status: Single    Spouse Name: N/A  . Number of Children: 0  . Years of Education: N/A   Occupational History  . retired [employer]    disabled now   Social History Main Topics  . Smoking status: Never Smoker   . Smokeless tobacco: Never Used  . Alcohol Use: No  . Drug Use: No  . Sexual Activity: No   Other Topics Concern  . Not on file   Social History Narrative     Objective: BP 114/78 mmHg  Pulse 90  Temp(Src) 97.5 F (36.4 C) (Oral)  Wt 182 lb (82.555 kg)  General: Alert and Oriented, No Acute Distress HEENT: Pupils equal, round, reactive to light. Conjunctivae clear.  Moist mucous membranes Lungs: clear comfortable work of breathing Cardiac: Regular rate and rhythm.  Abdomen: Normal bowel sounds, soft and non tender without palpable masses. No guarding or rigidity no rebound tenderness Extremities: No peripheral edema.  Strong peripheral pulses.  Mental Status: No depression, anxiety, nor agitation. Skin: Warm and dry.  Assessment & Plan: David Armstrong was seen today for stomach pain x 2  weeks.  Diagnoses and all orders  for this visit:  RUQ pain -     US Abdomen Complete -     Lipase -     COMPLETE METABOLIC PANEL WITH GFR   Right upper quadrant pain: Further evaluation with ultrasound and blood work as listed above. If the studies are unremarkable next step would be a trial of dicyclomine.  Return if symptoms worsen or fail to improve.

## 2015-07-03 ENCOUNTER — Ambulatory Visit (INDEPENDENT_AMBULATORY_CARE_PROVIDER_SITE_OTHER): Payer: Medicare Other

## 2015-07-03 ENCOUNTER — Telehealth: Payer: Self-pay | Admitting: Family Medicine

## 2015-07-03 DIAGNOSIS — N200 Calculus of kidney: Secondary | ICD-10-CM | POA: Diagnosis not present

## 2015-07-03 DIAGNOSIS — L820 Inflamed seborrheic keratosis: Secondary | ICD-10-CM | POA: Diagnosis not present

## 2015-07-03 DIAGNOSIS — R1011 Right upper quadrant pain: Secondary | ICD-10-CM

## 2015-07-03 LAB — COMPLETE METABOLIC PANEL WITH GFR
ALBUMIN: 3.9 g/dL (ref 3.6–5.1)
ALT: 17 U/L (ref 9–46)
AST: 18 U/L (ref 10–35)
Alkaline Phosphatase: 92 U/L (ref 40–115)
BUN: 15 mg/dL (ref 7–25)
CO2: 26 mmol/L (ref 20–31)
Calcium: 8.7 mg/dL (ref 8.6–10.3)
Chloride: 107 mmol/L (ref 98–110)
Creat: 0.91 mg/dL (ref 0.70–1.25)
GFR, EST NON AFRICAN AMERICAN: 89 mL/min (ref >=60)
GFR, Est African American: >89 mL/min (ref >=60)
GLUCOSE: 98 mg/dL (ref 65–99)
POTASSIUM: 4.3 mmol/L (ref 3.5–5.3)
SODIUM: 140 mmol/L (ref 135–146)
Total Bilirubin: 0.6 mg/dL (ref 0.2–1.2)
Total Protein: 6.3 g/dL (ref 6.1–8.1)

## 2015-07-03 LAB — LIPASE: LIPASE: 21 U/L (ref 7–60)

## 2015-07-03 MED ORDER — DICYCLOMINE HCL 10 MG PO CAPS: 10.0000 mg | ORAL_CAPSULE | Freq: Three times a day (TID) | ORAL | Status: DC

## 2015-07-03 NOTE — Telephone Encounter (Signed)
Referral has been placed. 

## 2015-07-03 NOTE — Telephone Encounter (Signed)
Pt notified.  He would like a referral to Dr. Richmond Campbell offices.

## 2015-07-03 NOTE — Telephone Encounter (Signed)
Will you please let patient know that his ultrasound did not show any signs of gallbladder or pancreatic disease.  I'd recommend he start on a PRN dose of dicyclomine that I've sent to his [facility].  Please let me now if this doesn't help by the end of the week, the next step would be a referral to a gastroenterologist

## 2015-07-04 DIAGNOSIS — G5762 Lesion of plantar nerve, left lower limb: Secondary | ICD-10-CM | POA: Diagnosis not present

## 2015-07-04 DIAGNOSIS — N401 Enlarged prostate with lower urinary tract symptoms: Secondary | ICD-10-CM | POA: Diagnosis not present

## 2015-07-04 DIAGNOSIS — G5761 Lesion of plantar nerve, right lower limb: Secondary | ICD-10-CM | POA: Diagnosis not present

## 2015-07-05 DIAGNOSIS — M9905 Segmental and somatic dysfunction of pelvic region: Secondary | ICD-10-CM | POA: Diagnosis not present

## 2015-07-05 DIAGNOSIS — M9904 Segmental and somatic dysfunction of sacral region: Secondary | ICD-10-CM | POA: Diagnosis not present

## 2015-07-05 DIAGNOSIS — M25562 Pain in left knee: Secondary | ICD-10-CM | POA: Diagnosis not present

## 2015-07-05 DIAGNOSIS — M9901 Segmental and somatic dysfunction of cervical region: Secondary | ICD-10-CM | POA: Diagnosis not present

## 2015-07-05 DIAGNOSIS — M9903 Segmental and somatic dysfunction of lumbar region: Secondary | ICD-10-CM | POA: Diagnosis not present

## 2015-07-05 DIAGNOSIS — M9902 Segmental and somatic dysfunction of thoracic region: Secondary | ICD-10-CM | POA: Diagnosis not present

## 2015-07-05 DIAGNOSIS — M531 Cervicobrachial syndrome: Secondary | ICD-10-CM | POA: Diagnosis not present

## 2015-07-09 DIAGNOSIS — M531 Cervicobrachial syndrome: Secondary | ICD-10-CM | POA: Diagnosis not present

## 2015-07-09 DIAGNOSIS — M9902 Segmental and somatic dysfunction of thoracic region: Secondary | ICD-10-CM | POA: Diagnosis not present

## 2015-07-09 DIAGNOSIS — M25562 Pain in left knee: Secondary | ICD-10-CM | POA: Diagnosis not present

## 2015-07-09 DIAGNOSIS — M9901 Segmental and somatic dysfunction of cervical region: Secondary | ICD-10-CM | POA: Diagnosis not present

## 2015-07-09 DIAGNOSIS — M9903 Segmental and somatic dysfunction of lumbar region: Secondary | ICD-10-CM | POA: Diagnosis not present

## 2015-07-09 DIAGNOSIS — M9905 Segmental and somatic dysfunction of pelvic region: Secondary | ICD-10-CM | POA: Diagnosis not present

## 2015-07-09 DIAGNOSIS — M9904 Segmental and somatic dysfunction of sacral region: Secondary | ICD-10-CM | POA: Diagnosis not present

## 2015-07-10 DIAGNOSIS — N138 Other obstructive and reflux uropathy: Secondary | ICD-10-CM | POA: Diagnosis not present

## 2015-07-10 DIAGNOSIS — N200 Calculus of kidney: Secondary | ICD-10-CM | POA: Diagnosis not present

## 2015-07-10 DIAGNOSIS — N5201 Erectile dysfunction due to arterial insufficiency: Secondary | ICD-10-CM | POA: Diagnosis not present

## 2015-07-10 DIAGNOSIS — N401 Enlarged prostate with lower urinary tract symptoms: Secondary | ICD-10-CM | POA: Diagnosis not present

## 2015-07-10 DIAGNOSIS — Z0000 Encounter for general adult medical examination without abnormal findings: Secondary | ICD-10-CM | POA: Diagnosis not present

## 2015-07-11 DIAGNOSIS — M9902 Segmental and somatic dysfunction of thoracic region: Secondary | ICD-10-CM | POA: Diagnosis not present

## 2015-07-11 DIAGNOSIS — M9903 Segmental and somatic dysfunction of lumbar region: Secondary | ICD-10-CM | POA: Diagnosis not present

## 2015-07-11 DIAGNOSIS — M9901 Segmental and somatic dysfunction of cervical region: Secondary | ICD-10-CM | POA: Diagnosis not present

## 2015-07-11 DIAGNOSIS — M531 Cervicobrachial syndrome: Secondary | ICD-10-CM | POA: Diagnosis not present

## 2015-07-11 DIAGNOSIS — M9904 Segmental and somatic dysfunction of sacral region: Secondary | ICD-10-CM | POA: Diagnosis not present

## 2015-07-11 DIAGNOSIS — M25562 Pain in left knee: Secondary | ICD-10-CM | POA: Diagnosis not present

## 2015-07-11 DIAGNOSIS — M9905 Segmental and somatic dysfunction of pelvic region: Secondary | ICD-10-CM | POA: Diagnosis not present

## 2015-07-16 ENCOUNTER — Telehealth: Payer: Self-pay

## 2015-07-16 ENCOUNTER — Ambulatory Visit: Payer: Medicare Other | Admitting: Family Medicine

## 2015-07-16 DIAGNOSIS — M9901 Segmental and somatic dysfunction of cervical region: Secondary | ICD-10-CM | POA: Diagnosis not present

## 2015-07-16 DIAGNOSIS — M25562 Pain in left knee: Secondary | ICD-10-CM | POA: Diagnosis not present

## 2015-07-16 DIAGNOSIS — M9902 Segmental and somatic dysfunction of thoracic region: Secondary | ICD-10-CM | POA: Diagnosis not present

## 2015-07-16 DIAGNOSIS — R1011 Right upper quadrant pain: Secondary | ICD-10-CM

## 2015-07-16 DIAGNOSIS — M531 Cervicobrachial syndrome: Secondary | ICD-10-CM | POA: Diagnosis not present

## 2015-07-16 DIAGNOSIS — M9903 Segmental and somatic dysfunction of lumbar region: Secondary | ICD-10-CM | POA: Diagnosis not present

## 2015-07-16 DIAGNOSIS — M9904 Segmental and somatic dysfunction of sacral region: Secondary | ICD-10-CM | POA: Diagnosis not present

## 2015-07-16 DIAGNOSIS — M9905 Segmental and somatic dysfunction of pelvic region: Secondary | ICD-10-CM | POA: Diagnosis not present

## 2015-07-16 MED ORDER — AMBULATORY NON FORMULARY MEDICATION: Status: DC

## 2015-07-16 NOTE — Telephone Encounter (Signed)
Pt stated that he is still having the same stomach pain and would like to be referred to a different gastroenterologist office to be seen sooner.  He also stated that the bentyl isn't working and wants to know is there something else he could take. Please advise.

## 2015-07-16 NOTE — Telephone Encounter (Signed)
David Armstrong, Rx placed in in-box ready for pickup/faxing and new gi referral has been placed.

## 2015-07-16 NOTE — Telephone Encounter (Signed)
Pt.notified

## 2015-07-19 DIAGNOSIS — M9905 Segmental and somatic dysfunction of pelvic region: Secondary | ICD-10-CM | POA: Diagnosis not present

## 2015-07-19 DIAGNOSIS — M9904 Segmental and somatic dysfunction of sacral region: Secondary | ICD-10-CM | POA: Diagnosis not present

## 2015-07-19 DIAGNOSIS — M531 Cervicobrachial syndrome: Secondary | ICD-10-CM | POA: Diagnosis not present

## 2015-07-19 DIAGNOSIS — M9903 Segmental and somatic dysfunction of lumbar region: Secondary | ICD-10-CM | POA: Diagnosis not present

## 2015-07-19 DIAGNOSIS — M9902 Segmental and somatic dysfunction of thoracic region: Secondary | ICD-10-CM | POA: Diagnosis not present

## 2015-07-19 DIAGNOSIS — M9901 Segmental and somatic dysfunction of cervical region: Secondary | ICD-10-CM | POA: Diagnosis not present

## 2015-07-19 DIAGNOSIS — M25562 Pain in left knee: Secondary | ICD-10-CM | POA: Diagnosis not present

## 2015-07-23 DIAGNOSIS — M9904 Segmental and somatic dysfunction of sacral region: Secondary | ICD-10-CM | POA: Diagnosis not present

## 2015-07-23 DIAGNOSIS — M2570 Osteophyte, unspecified joint: Secondary | ICD-10-CM | POA: Diagnosis not present

## 2015-07-23 DIAGNOSIS — M25571 Pain in right ankle and joints of right foot: Secondary | ICD-10-CM | POA: Diagnosis not present

## 2015-07-23 DIAGNOSIS — M25562 Pain in left knee: Secondary | ICD-10-CM | POA: Diagnosis not present

## 2015-07-23 DIAGNOSIS — M9901 Segmental and somatic dysfunction of cervical region: Secondary | ICD-10-CM | POA: Diagnosis not present

## 2015-07-23 DIAGNOSIS — M9905 Segmental and somatic dysfunction of pelvic region: Secondary | ICD-10-CM | POA: Diagnosis not present

## 2015-07-23 DIAGNOSIS — M9903 Segmental and somatic dysfunction of lumbar region: Secondary | ICD-10-CM | POA: Diagnosis not present

## 2015-07-23 DIAGNOSIS — M531 Cervicobrachial syndrome: Secondary | ICD-10-CM | POA: Diagnosis not present

## 2015-07-23 DIAGNOSIS — G5761 Lesion of plantar nerve, right lower limb: Secondary | ICD-10-CM | POA: Diagnosis not present

## 2015-07-23 DIAGNOSIS — M9902 Segmental and somatic dysfunction of thoracic region: Secondary | ICD-10-CM | POA: Diagnosis not present

## 2015-07-23 DIAGNOSIS — Z79899 Other long term (current) drug therapy: Secondary | ICD-10-CM | POA: Diagnosis not present

## 2015-07-25 DIAGNOSIS — M25562 Pain in left knee: Secondary | ICD-10-CM | POA: Diagnosis not present

## 2015-07-25 DIAGNOSIS — M9903 Segmental and somatic dysfunction of lumbar region: Secondary | ICD-10-CM | POA: Diagnosis not present

## 2015-07-25 DIAGNOSIS — M9905 Segmental and somatic dysfunction of pelvic region: Secondary | ICD-10-CM | POA: Diagnosis not present

## 2015-07-25 DIAGNOSIS — M9902 Segmental and somatic dysfunction of thoracic region: Secondary | ICD-10-CM | POA: Diagnosis not present

## 2015-07-25 DIAGNOSIS — M9904 Segmental and somatic dysfunction of sacral region: Secondary | ICD-10-CM | POA: Diagnosis not present

## 2015-07-25 DIAGNOSIS — M531 Cervicobrachial syndrome: Secondary | ICD-10-CM | POA: Diagnosis not present

## 2015-07-25 DIAGNOSIS — M9901 Segmental and somatic dysfunction of cervical region: Secondary | ICD-10-CM | POA: Diagnosis not present

## 2015-07-30 DIAGNOSIS — M9902 Segmental and somatic dysfunction of thoracic region: Secondary | ICD-10-CM | POA: Diagnosis not present

## 2015-07-30 DIAGNOSIS — M9901 Segmental and somatic dysfunction of cervical region: Secondary | ICD-10-CM | POA: Diagnosis not present

## 2015-07-30 DIAGNOSIS — M9905 Segmental and somatic dysfunction of pelvic region: Secondary | ICD-10-CM | POA: Diagnosis not present

## 2015-07-30 DIAGNOSIS — M9904 Segmental and somatic dysfunction of sacral region: Secondary | ICD-10-CM | POA: Diagnosis not present

## 2015-07-30 DIAGNOSIS — M25562 Pain in left knee: Secondary | ICD-10-CM | POA: Diagnosis not present

## 2015-07-30 DIAGNOSIS — M531 Cervicobrachial syndrome: Secondary | ICD-10-CM | POA: Diagnosis not present

## 2015-07-30 DIAGNOSIS — M9903 Segmental and somatic dysfunction of lumbar region: Secondary | ICD-10-CM | POA: Diagnosis not present

## 2015-07-31 DIAGNOSIS — M25774 Osteophyte, right foot: Secondary | ICD-10-CM | POA: Diagnosis not present

## 2015-07-31 DIAGNOSIS — M898X7 Other specified disorders of bone, ankle and foot: Secondary | ICD-10-CM | POA: Diagnosis not present

## 2015-07-31 DIAGNOSIS — M25571 Pain in right ankle and joints of right foot: Secondary | ICD-10-CM | POA: Diagnosis not present

## 2015-07-31 DIAGNOSIS — G5761 Lesion of plantar nerve, right lower limb: Secondary | ICD-10-CM | POA: Diagnosis not present

## 2015-07-31 DIAGNOSIS — M948X7 Other specified disorders of cartilage, ankle and foot: Secondary | ICD-10-CM | POA: Diagnosis not present

## 2015-08-03 DIAGNOSIS — M12271 Villonodular synovitis (pigmented), right ankle and foot: Secondary | ICD-10-CM | POA: Diagnosis not present

## 2015-08-03 DIAGNOSIS — G5761 Lesion of plantar nerve, right lower limb: Secondary | ICD-10-CM | POA: Diagnosis not present

## 2015-08-08 DIAGNOSIS — G5761 Lesion of plantar nerve, right lower limb: Secondary | ICD-10-CM | POA: Diagnosis not present

## 2015-08-13 DIAGNOSIS — M25571 Pain in right ankle and joints of right foot: Secondary | ICD-10-CM | POA: Diagnosis not present

## 2015-08-14 DIAGNOSIS — L700 Acne vulgaris: Secondary | ICD-10-CM | POA: Diagnosis not present

## 2015-08-20 DIAGNOSIS — M9902 Segmental and somatic dysfunction of thoracic region: Secondary | ICD-10-CM | POA: Diagnosis not present

## 2015-08-20 DIAGNOSIS — M9905 Segmental and somatic dysfunction of pelvic region: Secondary | ICD-10-CM | POA: Diagnosis not present

## 2015-08-20 DIAGNOSIS — M9903 Segmental and somatic dysfunction of lumbar region: Secondary | ICD-10-CM | POA: Diagnosis not present

## 2015-08-20 DIAGNOSIS — M62838 Other muscle spasm: Secondary | ICD-10-CM | POA: Diagnosis not present

## 2015-08-20 DIAGNOSIS — M9904 Segmental and somatic dysfunction of sacral region: Secondary | ICD-10-CM | POA: Diagnosis not present

## 2015-08-20 DIAGNOSIS — M9901 Segmental and somatic dysfunction of cervical region: Secondary | ICD-10-CM | POA: Diagnosis not present

## 2015-08-20 DIAGNOSIS — M25562 Pain in left knee: Secondary | ICD-10-CM | POA: Diagnosis not present

## 2015-08-22 DIAGNOSIS — M9903 Segmental and somatic dysfunction of lumbar region: Secondary | ICD-10-CM | POA: Diagnosis not present

## 2015-08-22 DIAGNOSIS — M25571 Pain in right ankle and joints of right foot: Secondary | ICD-10-CM | POA: Diagnosis not present

## 2015-08-22 DIAGNOSIS — M9905 Segmental and somatic dysfunction of pelvic region: Secondary | ICD-10-CM | POA: Diagnosis not present

## 2015-08-22 DIAGNOSIS — M9902 Segmental and somatic dysfunction of thoracic region: Secondary | ICD-10-CM | POA: Diagnosis not present

## 2015-08-22 DIAGNOSIS — M25562 Pain in left knee: Secondary | ICD-10-CM | POA: Diagnosis not present

## 2015-08-22 DIAGNOSIS — M62838 Other muscle spasm: Secondary | ICD-10-CM | POA: Diagnosis not present

## 2015-08-22 DIAGNOSIS — M9901 Segmental and somatic dysfunction of cervical region: Secondary | ICD-10-CM | POA: Diagnosis not present

## 2015-08-22 DIAGNOSIS — M9904 Segmental and somatic dysfunction of sacral region: Secondary | ICD-10-CM | POA: Diagnosis not present

## 2015-08-23 DIAGNOSIS — M5481 Occipital neuralgia: Secondary | ICD-10-CM | POA: Diagnosis not present

## 2015-08-23 DIAGNOSIS — M5412 Radiculopathy, cervical region: Secondary | ICD-10-CM | POA: Diagnosis not present

## 2015-08-23 DIAGNOSIS — G603 Idiopathic progressive neuropathy: Secondary | ICD-10-CM | POA: Diagnosis not present

## 2015-08-23 DIAGNOSIS — G5603 Carpal tunnel syndrome, bilateral upper limbs: Secondary | ICD-10-CM | POA: Diagnosis not present

## 2015-08-23 DIAGNOSIS — M5417 Radiculopathy, lumbosacral region: Secondary | ICD-10-CM | POA: Diagnosis not present

## 2015-08-27 DIAGNOSIS — H11431 Conjunctival hyperemia, right eye: Secondary | ICD-10-CM | POA: Diagnosis not present

## 2015-08-27 DIAGNOSIS — H10431 Chronic follicular conjunctivitis, right eye: Secondary | ICD-10-CM | POA: Diagnosis not present

## 2015-09-04 ENCOUNTER — Ambulatory Visit (INDEPENDENT_AMBULATORY_CARE_PROVIDER_SITE_OTHER): Payer: Medicare Other | Admitting: Internal Medicine

## 2015-09-04 ENCOUNTER — Encounter: Payer: Self-pay | Admitting: Internal Medicine

## 2015-09-04 VITALS — BP 114/68 | HR 80 | Ht 70.25 in | Wt 177.8 lb

## 2015-09-04 DIAGNOSIS — K5900 Constipation, unspecified: Secondary | ICD-10-CM

## 2015-09-04 DIAGNOSIS — K648 Other hemorrhoids: Secondary | ICD-10-CM | POA: Diagnosis not present

## 2015-09-04 DIAGNOSIS — K219 Gastro-esophageal reflux disease without esophagitis: Secondary | ICD-10-CM

## 2015-09-04 MED ORDER — LUBIPROSTONE 24 MCG PO CAPS: 24.0000 ug | ORAL_CAPSULE | Freq: Two times a day (BID) | ORAL | Status: DC

## 2015-09-04 MED ORDER — RANITIDINE HCL 150 MG PO TABS: 150.0000 mg | ORAL_TABLET | Freq: Every day | ORAL | Status: DC

## 2015-09-04 NOTE — Patient Instructions (Signed)
We have sent the following medications to your pharmacy for you to pick up at your convenience: Amitiza 24 mcg twice daily Ranitidine 150 mg every night  Please purchase the following medications over the counter and take as directed: Recticare as per box instructions for perianal itching  If you are age 65 or older, your body mass index should be between 23-30. Your Body mass index is 25.34 kg/(m^2). If this is out of the aforementioned range listed, please consider follow up with your Primary Care Provider.  If you are age 15 or younger, your body mass index should be between 19-25. Your Body mass index is 25.34 kg/(m^2). If this is out of the aformentioned range listed, please consider follow up with your Primary Care Provider.

## 2015-09-04 NOTE — Progress Notes (Signed)
Subjective:    Patient ID: David Armstrong, male    DOB: [birth year], 65 y.o.   MRN: [id number]  HPI David Armstrong is a 65 year old male with a history of IBS with chronic constipation, GERD, diverticulosis, anxiety and depression who is here for follow-up. He is last seen in the office in July 2016 by Nicoletta Ba, PA-C to discuss his chronic constipation. Today he reports that he continues to have issues with constipation. He previously was using Linzess but did not get much result with the 145 g dose. He has been using Dulcolax suppositories but at times he reports these are not working. He tries to have a bowel movement once per day but he has found his stools are often small and hard. They can be difficult to pass. Occasionally he will have left lower quadrant discomfort relieved with defecation. He denies seeing blood in his stool or melena. He reports a good appetite without nausea or vomiting. He does feel some epigastric pressure which can radiate to the back if he overeats. He feels that this is related to his hiatal hernia. Occasionally he also feels indigestion. He was previous he taking ranitidine but is no longer for unclear reason. Denies dysphagia or odynophagia. He does report occasionally having perianal itching after bowel movement. This can at times be "intense". He was seen by his primary care provider in March of this year where he reported a right upper quadrant pain. This had been going on about 2 weeks. He reports this has resolved. This workup included an abdominal ultrasound which showed a nonobstructing left renal stone. He saw Dr. Karsten Ro with urology who said the stone was stable and unchanged. Liver was echogenic suggesting fatty infiltration. His liver enzymes were normal. Again this pain has resolved at this point.  Since last visit he also had a CT scan for midabdominal pain. This was performed on 10-16. I have reviewed this study. It showed no acute abnormalities. There  were nonobstructing left renal stones seen. Mild bibasal atelectasis. Mild diverticulosis in the ascending colon without diverticulitis. The liver and spleen were unremarkable in appearance. The pancreas was also unremarkable.   Review of Systems As per history of present illness, otherwise negative  Current Medications, Allergies, Past Medical History, Past Surgical History, Family History and Social History were reviewed in [employer] record.     Objective:   Physical Exam BP 114/68 mmHg  Pulse 80  Ht 5' 10.25" (1.784 m)  Wt 177 lb 12.8 oz (80.65 kg)  BMI 25.34 kg/m2 Constitutional: Well-developed and well-nourished. No distress. HEENT: Normocephalic and atraumatic. Oropharynx is clear and moist. No oropharyngeal exudate. Conjunctivae are normal.  No scleral icterus. Neck: Neck supple. Trachea midline. Cardiovascular: Normal rate, regular rhythm and intact distal pulses. No M/R/G Pulmonary/chest: Effort normal and breath sounds normal. No wheezing, rales or rhonchi. Abdominal: Soft, nontender, nondistended. Bowel sounds active throughout. There are no masses palpable. No hepatosplenomegaly. Extremities: no clubbing, cyanosis, or edema Lymphadenopathy: No cervical adenopathy noted. Neurological: Alert and oriented to person place and time. Skin: Skin is warm and dry. No rashes noted. Psychiatric: Normal mood and affect. Behavior is normal.  CBC    Component Value Date/Time   WBC 9.9 01/14/2015 2054   RBC 5.06 01/14/2015 2054   HGB 15.3 01/14/2015 2054   HCT 45.3 01/14/2015 2054   PLT 163 01/14/2015 2054   MCV 89.5 01/14/2015 2054   MCH 30.2 01/14/2015 2054   MCHC 33.8 01/14/2015 2054  RDW 12.9 01/14/2015 2054   LYMPHSABS 1.7 03/14/2014 1531   MONOABS 0.7 03/14/2014 1531   EOSABS 0.4 03/14/2014 1531   BASOSABS 0.0 03/14/2014 1531    CMP     Component Value Date/Time   NA 140 07/02/2015 1417   K 4.3 07/02/2015 1417   CL 107 07/02/2015 1417    CO2 26 07/02/2015 1417   GLUCOSE 98 07/02/2015 1417   BUN 15 07/02/2015 1417   CREATININE 0.91 07/02/2015 1417   CREATININE 0.98 01/14/2015 2054   CALCIUM 8.7 07/02/2015 1417   PROT 6.3 07/02/2015 1417   ALBUMIN 3.9 07/02/2015 1417   AST 18 07/02/2015 1417   ALT 17 07/02/2015 1417   ALKPHOS 92 07/02/2015 1417   BILITOT 0.6 07/02/2015 1417   GFRNONAA 89 07/02/2015 1417   GFRNONAA >60 01/14/2015 2054   GFRAA >89 07/02/2015 1417   GFRAA >60 01/14/2015 2054      Assessment & Plan:  65 year old male with a history of IBS with chronic constipation, GERD, diverticulosis, anxiety and depression who is here for follow-up.  1. IBS-C -- Constipation continues to be his predominant complaint and I feel that this leads to left-sided crampy pain. Previous nonresponse to Linzess at 145 g daily. Trial of Amitiza 24 g twice a day.  2. Internal hemorrhoids with perianal itching -- hemorrhoids likely to improve with improving constipation, see #1. RectaCare per box instruction recommended as needed for itching. If persistent and not improving consider hemorrhoidal banding  3. GERD with hiatal hernia -- intermittent indigestion. Previous response to ranitidine. No alarm symptoms. Resume ranitidine 150 mg each evening. Can take a morning dose if indigestion occurring during the daytime.  6 month follow-up, sooner if necessary 25 minutes spent with the patient today. Greater than 50% was spent in counseling and coordination of care with the patient

## 2015-09-05 DIAGNOSIS — N200 Calculus of kidney: Secondary | ICD-10-CM | POA: Diagnosis not present

## 2015-09-05 DIAGNOSIS — R1032 Left lower quadrant pain: Secondary | ICD-10-CM | POA: Diagnosis not present

## 2015-09-05 DIAGNOSIS — Z0000 Encounter for general adult medical examination without abnormal findings: Secondary | ICD-10-CM | POA: Diagnosis not present

## 2015-09-11 DIAGNOSIS — M9904 Segmental and somatic dysfunction of sacral region: Secondary | ICD-10-CM | POA: Diagnosis not present

## 2015-09-11 DIAGNOSIS — M9902 Segmental and somatic dysfunction of thoracic region: Secondary | ICD-10-CM | POA: Diagnosis not present

## 2015-09-11 DIAGNOSIS — M62838 Other muscle spasm: Secondary | ICD-10-CM | POA: Diagnosis not present

## 2015-09-11 DIAGNOSIS — M9903 Segmental and somatic dysfunction of lumbar region: Secondary | ICD-10-CM | POA: Diagnosis not present

## 2015-09-11 DIAGNOSIS — M9901 Segmental and somatic dysfunction of cervical region: Secondary | ICD-10-CM | POA: Diagnosis not present

## 2015-09-11 DIAGNOSIS — M25562 Pain in left knee: Secondary | ICD-10-CM | POA: Diagnosis not present

## 2015-09-11 DIAGNOSIS — M9905 Segmental and somatic dysfunction of pelvic region: Secondary | ICD-10-CM | POA: Diagnosis not present

## 2015-09-19 DIAGNOSIS — G5762 Lesion of plantar nerve, left lower limb: Secondary | ICD-10-CM | POA: Diagnosis not present

## 2015-09-19 DIAGNOSIS — Z79899 Other long term (current) drug therapy: Secondary | ICD-10-CM | POA: Diagnosis not present

## 2015-09-19 DIAGNOSIS — M25572 Pain in left ankle and joints of left foot: Secondary | ICD-10-CM | POA: Diagnosis not present

## 2015-09-25 DIAGNOSIS — G5762 Lesion of plantar nerve, left lower limb: Secondary | ICD-10-CM | POA: Diagnosis not present

## 2015-09-28 DIAGNOSIS — G5762 Lesion of plantar nerve, left lower limb: Secondary | ICD-10-CM | POA: Diagnosis not present

## 2015-10-01 DIAGNOSIS — M25562 Pain in left knee: Secondary | ICD-10-CM | POA: Diagnosis not present

## 2015-10-01 DIAGNOSIS — M9902 Segmental and somatic dysfunction of thoracic region: Secondary | ICD-10-CM | POA: Diagnosis not present

## 2015-10-01 DIAGNOSIS — M62838 Other muscle spasm: Secondary | ICD-10-CM | POA: Diagnosis not present

## 2015-10-01 DIAGNOSIS — M9905 Segmental and somatic dysfunction of pelvic region: Secondary | ICD-10-CM | POA: Diagnosis not present

## 2015-10-01 DIAGNOSIS — M9904 Segmental and somatic dysfunction of sacral region: Secondary | ICD-10-CM | POA: Diagnosis not present

## 2015-10-01 DIAGNOSIS — M9901 Segmental and somatic dysfunction of cervical region: Secondary | ICD-10-CM | POA: Diagnosis not present

## 2015-10-01 DIAGNOSIS — M9903 Segmental and somatic dysfunction of lumbar region: Secondary | ICD-10-CM | POA: Diagnosis not present

## 2015-10-08 ENCOUNTER — Telehealth: Payer: Self-pay | Admitting: Internal Medicine

## 2015-10-08 ENCOUNTER — Encounter: Payer: Self-pay | Admitting: *Deleted

## 2015-10-08 NOTE — Telephone Encounter (Signed)
Amitiza has been approved through [employer] from 10/08/15-10/08/2018. PA# [employer]it Plan [id number] AG.

## 2015-10-08 NOTE — Telephone Encounter (Signed)
Prior authorization initiated through [email or web address]

## 2015-10-10 DIAGNOSIS — M25572 Pain in left ankle and joints of left foot: Secondary | ICD-10-CM | POA: Diagnosis not present

## 2015-10-24 ENCOUNTER — Ambulatory Visit (INDEPENDENT_AMBULATORY_CARE_PROVIDER_SITE_OTHER): Payer: Medicare Other | Admitting: Family Medicine

## 2015-10-24 ENCOUNTER — Encounter: Payer: Self-pay | Admitting: Family Medicine

## 2015-10-24 VITALS — BP 123/84 | HR 86 | Wt 176.0 lb

## 2015-10-24 DIAGNOSIS — L309 Dermatitis, unspecified: Secondary | ICD-10-CM

## 2015-10-24 DIAGNOSIS — E8809 Other disorders of plasma-protein metabolism, not elsewhere classified: Secondary | ICD-10-CM | POA: Diagnosis not present

## 2015-10-24 MED ORDER — PREDNISONE 20 MG PO TABS: ORAL_TABLET | ORAL | Status: AC

## 2015-10-24 NOTE — Progress Notes (Signed)
CC: David Armstrong is a 65 y.o. male is here for Pruritis   Subjective: HPI:  Itching of the torso for the last week on a daily basis worse during the day. Worse on the back as opposed to the front. No interventions as of yet. Symptoms are mild to moderate in severity and not changing. He denies any change in personal care products or clothing. No new supplements or medications. He's never had this before. He denies any skin changes that he can tell. He's been unable to look at his back for any skin changes. He denies any worsening symptoms in or soon after the shower. No fevers, chills or pain.  He had a metabolic panel drawn a few weeks ago in preparation for a foot surgery and was told that his protein level was deficient. He's never had this before.   Review Of Systems Outlined In HPI  Past Medical History  Diagnosis Date  . ANXIETY   . Personal history of colonic polyps 10/27/2007    HYPERPLASTIC POLYP  . DEPRESSION   . DIVERTICULOSIS, COLON [phone]    Colonoscopy  . GERD   . HYPERLIPIDEMIA   . RENAL CALCULUS   . IBS (irritable bowel syndrome)   . Hiatal hernia [id number]    EGD  . H. pylori infection   . Arthritis   . Anal fissure   . TMJ (temporomandibular joint syndrome)   . Unspecified gastritis and gastroduodenitis without mention of hemorrhage 2009    EGD  . Hemorrhoids [id number]    Colonoscopy   . Peripheral neuropathy ([place])   . Fatty liver   . Internal hemorrhoids   . Chronic constipation     Past Surgical History  Procedure Laterality Date  . Knee surgery Left   . Shoulder surgery Bilateral 07/2005, 03/2011    left x 2  . Heel spur surgery Bilateral   . Hammer toe surgery Left   . Lithotripsy    . 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 Partial knee arthroplasty Right   . Foot neuroma surgery Right   . Eye lid surgery Bilateral 01/2014   Family History  Problem Relation Age of Onset  . Heart disease Mother   . Stroke Mother   . Cirrhosis  Father   . Heart disease Father   . Skin cancer Father   . Colon cancer Neg Hx   . Prostate cancer Maternal Grandfather   . Parkinson's disease Paternal Grandfather   . Heart disease Brother     Social History   Social History  . Marital Status: Single    Spouse Name: N/A  . Number of Children: 0  . Years of Education: N/A   Occupational History  . retired [employer]    disabled now   Social History Main Topics  . Smoking status: Never Smoker   . Smokeless tobacco: Never Used  . Alcohol Use: No  . Drug Use: No  . Sexual Activity: No   Other Topics Concern  . Not on file   Social History Narrative     Objective: BP 123/84 mmHg  Pulse 86  Wt 176 lb (79.833 kg)  Vital signs reviewed. General: Alert and Oriented, No Acute Distress HEENT: Pupils equal, round, reactive to light. Conjunctivae clear.  External ears unremarkable.  Moist mucous membranes. Lungs: Clear and comfortable work of breathing, speaking in full sentences without accessory muscle use. Cardiac: Regular rate and rhythm.  Neuro: CN II-XII grossly intact, gait normal. Extremities: No peripheral  edema.  Strong peripheral pulses.  Mental Status: No depression, anxiety, nor agitation. Logical though process. Skin: Warm and dry. Moderately dry skin on the back with some mild eczematous changes scattered throughout the back of the torso  Assessment & Plan: David Armstrong was seen today for pruritis.  Diagnoses and all orders for this visit:  Proteins serum plasma low -     Comprehensive Metabolic Panel (CMET)  Dermatitis -     predniSONE (DELTASONE) 20 MG tablet; Three tabs daily days 1-3, two tabs daily days 4-6, one tab daily days 7-9, half tab daily days 10-13.   Dermatitis due to dry skin, discussed moisturizers and prednisone taper. No sign of bacterial or fungal infection Rechecking serum protein level.  Return if symptoms worsen or fail to improve.

## 2015-10-25 LAB — COMPREHENSIVE METABOLIC PANEL
ALBUMIN: 4 g/dL (ref 3.6–5.1)
ALK PHOS: 82 U/L (ref 40–115)
ALT: 15 U/L (ref 9–46)
AST: 16 U/L (ref 10–35)
BILIRUBIN TOTAL: 0.8 mg/dL (ref 0.2–1.2)
BUN: 13 mg/dL (ref 7–25)
CO2: 27 mmol/L (ref 20–31)
CREATININE: 0.92 mg/dL (ref 0.70–1.25)
Calcium: 8.8 mg/dL (ref 8.6–10.3)
Chloride: 106 mmol/L (ref 98–110)
Glucose, Bld: 70 mg/dL (ref 65–99)
Potassium: 4.1 mmol/L (ref 3.5–5.3)
SODIUM: 141 mmol/L (ref 135–146)
TOTAL PROTEIN: 6.5 g/dL (ref 6.1–8.1)

## 2015-10-29 ENCOUNTER — Other Ambulatory Visit: Payer: Self-pay | Admitting: Ophthalmology

## 2015-10-29 DIAGNOSIS — H0012 Chalazion right lower eyelid: Secondary | ICD-10-CM | POA: Diagnosis not present

## 2015-10-29 DIAGNOSIS — H018 Other specified inflammations of eyelid: Secondary | ICD-10-CM | POA: Diagnosis not present

## 2015-10-29 DIAGNOSIS — D485 Neoplasm of uncertain behavior of skin: Secondary | ICD-10-CM | POA: Diagnosis not present

## 2015-10-29 DIAGNOSIS — D3101 Benign neoplasm of right conjunctiva: Secondary | ICD-10-CM | POA: Diagnosis not present

## 2015-11-07 ENCOUNTER — Encounter: Payer: Self-pay | Admitting: Family Medicine

## 2015-11-07 ENCOUNTER — Ambulatory Visit (INDEPENDENT_AMBULATORY_CARE_PROVIDER_SITE_OTHER): Payer: Medicare Other | Admitting: Family Medicine

## 2015-11-07 VITALS — BP 124/83 | HR 98 | Temp 98.3°F | Wt 177.0 lb

## 2015-11-07 DIAGNOSIS — R519 Headache, unspecified: Secondary | ICD-10-CM

## 2015-11-07 DIAGNOSIS — G5762 Lesion of plantar nerve, left lower limb: Secondary | ICD-10-CM | POA: Diagnosis not present

## 2015-11-07 DIAGNOSIS — G5761 Lesion of plantar nerve, right lower limb: Secondary | ICD-10-CM | POA: Diagnosis not present

## 2015-11-07 DIAGNOSIS — R51 Headache: Secondary | ICD-10-CM | POA: Diagnosis not present

## 2015-11-07 MED ORDER — KETOROLAC TROMETHAMINE 60 MG/2ML IM SOLN
60.0000 mg | Freq: Once | INTRAMUSCULAR | Status: AC
  Administered 2015-11-07: 60 mg via INTRAMUSCULAR

## 2015-11-07 NOTE — Progress Notes (Signed)
CC: David Armstrong is a 65 y.o. male is here for headaches   Subjective: HPI:  Dull headache in the frontal region for the past 2 weeks. Nothing seems to make it better or worse. It fluctuates throughout the day. No interventions as of yet. He started taking prednisone 2 days ago and states that the headache is slowly improving. He denies any motor or sensory disturbances, fever, chills, nasal congestion, postnasal drip or cough. Denies any recent or remote trauma but did have a stye removed in his right eye 2 weeks ago with no ocular pain or visual disturbance. No ocular discharge.   Review Of Systems Outlined In HPI  Past Medical History:  Diagnosis Date  . Anal fissure   . ANXIETY   . Arthritis   . Chronic constipation   . DEPRESSION   . DIVERTICULOSIS, COLON [phone]   Colonoscopy  . Fatty liver   . GERD   . H. pylori infection   . Hemorrhoids [id number]   Colonoscopy   . Hiatal hernia [id number]   EGD  . HYPERLIPIDEMIA   . IBS (irritable bowel syndrome)   . Internal hemorrhoids   . Peripheral neuropathy ([place])   . Personal history of colonic polyps 10/27/2007   HYPERPLASTIC POLYP  . RENAL CALCULUS   . TMJ (temporomandibular joint syndrome)   . Unspecified gastritis and gastroduodenitis without mention of hemorrhage 2009   EGD    Past Surgical History:  Procedure Laterality Date  . Eye lid surgery Bilateral 01/2014  . FOOT NEUROMA SURGERY Right   . HAMMER TOE SURGERY Left   . HEEL SPUR SURGERY Bilateral   . KNEE SURGERY Left   . LITHOTRIPSY    . PARTIAL KNEE ARTHROPLASTY Right   . PATELLA ARTHROPLASTY Right 07/17/2003  . SHOULDER SURGERY Bilateral 07/2005, 03/2011   left x 2   Family History  Problem Relation Age of Onset  . Heart disease Mother   . Stroke Mother   . Cirrhosis Father   . Heart disease Father   . Skin cancer Father   . Colon cancer Neg Hx   . Prostate cancer Maternal Grandfather   . Parkinson's disease Paternal Grandfather   . Heart  disease Brother     Social History   Social History  . Marital status: Single    Spouse name: N/A  . Number of children: 0  . Years of education: N/A   Occupational History  . retired [employer]    disabled now   Social History Main Topics  . Smoking status: Never Smoker  . Smokeless tobacco: Never Used  . Alcohol use No  . Drug use: No  . Sexual activity: No   Other Topics Concern  . Not on file   Social History Narrative  . No narrative on file     Objective: BP 124/83   Pulse 98   Temp 98.3 F (36.8 C) (Oral)   Wt 177 lb (80.3 kg)   BMI 25.22 kg/m   General: Alert and Oriented, No Acute Distress HEENT: Pupils equal, round, reactive to light. Conjunctivae clear.  External ears unremarkable, canals clear with intact TMs with appropriate landmarks.  Middle ear appears open without effusion. Pink inferior turbinates.  Moist mucous membranes, pharynx without inflammation nor lesions.  Neck supple without palpable lymphadenopathy nor abnormal masses. Neuro: CN II-XII grossly intact, full strength/rom of all four extremities, gait normal, rapid alternating movements normal, heel-shin test normal, Rhomberg normal. Abdomen: Normal bowel sounds, soft and  non tender without palpable masses. Extremities: No peripheral edema.  Strong peripheral pulses.  Mental Status: No depression, anxiety, nor agitation. Skin: Warm and dry.  Assessment & Plan: David Armstrong was seen today for headaches.  Diagnoses and all orders for this visit:  Acute nonintractable headache, unspecified headache type -     ketorolac (TORADOL) injection 60 mg; Inject 2 mLs (60 mg total) into the muscle once.   Headache without any red flags continue on prednisone and given Toradol here in the clinic. Follow-up at the end of the prednisone taper if no better.Signs and symptoms requring emergent/urgent reevaluation were discussed with the patient.  25 minutes spent face-to-face during visit today of which  at least 50% was counseling or coordinating care regarding: 1. Acute nonintractable headache, unspecified headache type      Discussed with this patient that I will be resigning from my position here with [facility] in September in order to stay with my family who will be moving to [facility]. I let him know about the providers that are still accepting patients and I feel that this individual will be under great care if he/she stays here with [facility].  Return for Either Jade or Dr. Sheppard Coil to Establish Care .

## 2015-11-20 ENCOUNTER — Ambulatory Visit: Payer: Medicare Other | Admitting: Family Medicine

## 2015-11-23 ENCOUNTER — Ambulatory Visit (INDEPENDENT_AMBULATORY_CARE_PROVIDER_SITE_OTHER): Payer: Medicare Other

## 2015-11-23 ENCOUNTER — Telehealth: Payer: Self-pay | Admitting: Family Medicine

## 2015-11-23 ENCOUNTER — Encounter: Payer: Self-pay | Admitting: Family Medicine

## 2015-11-23 ENCOUNTER — Ambulatory Visit (INDEPENDENT_AMBULATORY_CARE_PROVIDER_SITE_OTHER): Payer: Medicare Other | Admitting: Family Medicine

## 2015-11-23 VITALS — BP 110/78 | HR 93 | Wt 174.0 lb

## 2015-11-23 DIAGNOSIS — R51 Headache: Secondary | ICD-10-CM

## 2015-11-23 DIAGNOSIS — R519 Headache, unspecified: Secondary | ICD-10-CM

## 2015-11-23 DIAGNOSIS — K589 Irritable bowel syndrome without diarrhea: Secondary | ICD-10-CM

## 2015-11-23 MED ORDER — TOPIRAMATE 25 MG PO TABS: 25.0000 mg | ORAL_TABLET | Freq: Two times a day (BID) | ORAL | 0 refills | Status: DC

## 2015-11-23 MED ORDER — LINACLOTIDE 290 MCG PO CAPS: 290.0000 ug | ORAL_CAPSULE | Freq: Every day | ORAL | 2 refills | Status: DC

## 2015-11-23 NOTE — Telephone Encounter (Signed)
Pt.notified

## 2015-11-23 NOTE — Progress Notes (Signed)
CC: David Armstrong is a 65 y.o. male is here for Headache   Subjective: HPI:  Continues to have a headache localized in the frontal region. It's continuing to be dull, nothing seems to make it better or worse. Prednisone did not provide any benefit. He still denies any nasal congestion sore throat post nasal drip or vision disturbance. He denies any recent or remote trauma. For the past week he's also been having some posterior right-sided neck discomfort described as a tightness. He denies any positional component to his headache. It does not wake him up from his sleep. It has not gotten any better or worse since I saw him last still mild in severity. Denies any cognitive issues balance problems nor any motor or sensory disturbances. No midline posterior neck pain   Review Of Systems Outlined In HPI  Past Medical History:  Diagnosis Date  . Anal fissure   . ANXIETY   . Arthritis   . Chronic constipation   . DEPRESSION   . DIVERTICULOSIS, COLON [phone]   Colonoscopy  . Fatty liver   . GERD   . H. pylori infection   . Hemorrhoids [id number]   Colonoscopy   . Hiatal hernia [id number]   EGD  . HYPERLIPIDEMIA   . IBS (irritable bowel syndrome)   . Internal hemorrhoids   . Peripheral neuropathy ([place])   . Personal history of colonic polyps 10/27/2007   HYPERPLASTIC POLYP  . RENAL CALCULUS   . TMJ (temporomandibular joint syndrome)   . Unspecified gastritis and gastroduodenitis without mention of hemorrhage 2009   EGD    Past Surgical History:  Procedure Laterality Date  . Eye lid surgery Bilateral 01/2014  . FOOT NEUROMA SURGERY Right   . HAMMER TOE SURGERY Left   . HEEL SPUR SURGERY Bilateral   . KNEE SURGERY Left   . LITHOTRIPSY    . PARTIAL KNEE ARTHROPLASTY Right   . PATELLA ARTHROPLASTY Right 07/17/2003  . SHOULDER SURGERY Bilateral 07/2005, 03/2011   left x 2   Family History  Problem Relation Age of Onset  . Heart disease Mother   . Stroke Mother   .  Cirrhosis Father   . Heart disease Father   . Skin cancer Father   . Colon cancer Neg Hx   . Prostate cancer Maternal Grandfather   . Parkinson's disease Paternal Grandfather   . Heart disease Brother     Social History   Social History  . Marital status: Single    Spouse name: N/A  . Number of children: 0  . Years of education: N/A   Occupational History  . retired [employer]    disabled now   Social History Main Topics  . Smoking status: Never Smoker  . Smokeless tobacco: Never Used  . Alcohol use No  . Drug use: No  . Sexual activity: No   Other Topics Concern  . Not on file   Social History Narrative  . No narrative on file     Objective: BP 110/78   Pulse 93   Wt 174 lb (78.9 kg)   BMI 24.79 kg/m   General: Alert and Oriented, No Acute Distress HEENT: Pupils equal, round, reactive to light. Conjunctivae clear.  External ears unremarkable, canals clear with intact TMs with appropriate landmarks.  Middle ear appears open without effusion. Pink inferior turbinates.  Moist mucous membranes, pharynx without inflammation nor lesions.  Neck supple without palpable lymphadenopathy nor abnormal masses. Lungs: Clear to auscultation bilaterally, no  wheezing/ronchi/rales.  Comfortable work of breathing. Good air movement. Neuro: CN II-XII grossly intact, full strength/rom of all four extremities,  gait normal, rapid alternating movements normal, heel-shin test normal, Rhomberg normal. Cardiac: Regular rate and rhythm.  Extremities: No peripheral edema.  Strong peripheral pulses.  Mental Status: No depression, anxiety, nor agitation. Skin: Warm and dry.  Assessment & Plan: [name] was seen today for headache.  Diagnoses and all orders for this visit:  IBS (irritable bowel syndrome) -     linaclotide (LINZESS) 290 MCG CAPS capsule; Take 1 capsule (290 mcg total) by mouth daily before breakfast.  Chronic intractable headache, unspecified headache type -     CT Head  Wo Contrast   IBS: He wants to know if he can switch to linzess, seems reasonable Chronic headache: He's going to try Zanaflex that he already has at home, if this doesn't help by Saturday he'll return for CT scan of the head. Signs and symptoms requring emergent/urgent reevaluation were discussed with the patient.   Return if symptoms worsen or fail to improve.

## 2015-11-23 NOTE — Telephone Encounter (Signed)
Will you please let patient know that his CT scan of the head was normal and reassuring. I'd recommend he start on a mediation called topamax, please let me know of any improvement by Monday. If it's helping he'll only  Need to take this for three weeks.

## 2015-12-14 DIAGNOSIS — Z86718 Personal history of other venous thrombosis and embolism: Secondary | ICD-10-CM | POA: Insufficient documentation

## 2015-12-19 DIAGNOSIS — J324 Chronic pansinusitis: Secondary | ICD-10-CM | POA: Diagnosis not present

## 2015-12-19 DIAGNOSIS — J302 Other seasonal allergic rhinitis: Secondary | ICD-10-CM | POA: Diagnosis not present

## 2015-12-19 DIAGNOSIS — R053 Chronic cough: Secondary | ICD-10-CM | POA: Insufficient documentation

## 2015-12-19 DIAGNOSIS — R05 Cough: Secondary | ICD-10-CM | POA: Diagnosis not present

## 2015-12-26 ENCOUNTER — Encounter: Payer: Self-pay | Admitting: Internal Medicine

## 2015-12-27 DIAGNOSIS — M5481 Occipital neuralgia: Secondary | ICD-10-CM | POA: Diagnosis not present

## 2015-12-27 DIAGNOSIS — M5412 Radiculopathy, cervical region: Secondary | ICD-10-CM | POA: Diagnosis not present

## 2015-12-27 DIAGNOSIS — G603 Idiopathic progressive neuropathy: Secondary | ICD-10-CM | POA: Diagnosis not present

## 2015-12-27 DIAGNOSIS — R260 Ataxic gait: Secondary | ICD-10-CM | POA: Diagnosis not present

## 2015-12-27 DIAGNOSIS — M5417 Radiculopathy, lumbosacral region: Secondary | ICD-10-CM | POA: Diagnosis not present

## 2016-01-17 DIAGNOSIS — N200 Calculus of kidney: Secondary | ICD-10-CM | POA: Diagnosis not present

## 2016-01-17 DIAGNOSIS — R1030 Lower abdominal pain, unspecified: Secondary | ICD-10-CM | POA: Diagnosis not present

## 2016-01-22 ENCOUNTER — Ambulatory Visit (INDEPENDENT_AMBULATORY_CARE_PROVIDER_SITE_OTHER): Payer: Medicare Other | Admitting: Family Medicine

## 2016-01-22 ENCOUNTER — Ambulatory Visit (INDEPENDENT_AMBULATORY_CARE_PROVIDER_SITE_OTHER): Payer: Medicare Other

## 2016-01-22 ENCOUNTER — Encounter: Payer: Self-pay | Admitting: Family Medicine

## 2016-01-22 VITALS — BP 117/79 | HR 103 | Temp 97.7°F | Wt 173.0 lb

## 2016-01-22 DIAGNOSIS — R05 Cough: Secondary | ICD-10-CM

## 2016-01-22 DIAGNOSIS — R059 Cough, unspecified: Secondary | ICD-10-CM

## 2016-01-22 DIAGNOSIS — R0989 Other specified symptoms and signs involving the circulatory and respiratory systems: Secondary | ICD-10-CM | POA: Diagnosis not present

## 2016-01-22 MED ORDER — BENZONATATE 200 MG PO CAPS: 200.0000 mg | ORAL_CAPSULE | Freq: Three times a day (TID) | ORAL | 0 refills | Status: DC | PRN

## 2016-01-22 MED ORDER — IPRATROPIUM BROMIDE 0.06 % NA SOLN: 2.0000 | NASAL | 6 refills | Status: DC | PRN

## 2016-01-22 MED ORDER — AZITHROMYCIN 250 MG PO TABS: 250.0000 mg | ORAL_TABLET | Freq: Every day | ORAL | 0 refills | Status: DC

## 2016-01-22 NOTE — Patient Instructions (Signed)
Thank you for coming in today. Use tessalon perles for cough and use the nasal spray.  Use backup antibiotics if not better.  Call or go to the emergency room if you get worse, have trouble breathing, have chest pains, or palpitations.   Follow up with Dr Sheppard Coil or [place] Planas to establish care.     Acute Bronchitis Bronchitis is inflammation of the airways that extend from the windpipe into the lungs (bronchi). The inflammation often causes mucus to develop. This leads to a cough, which is the most common symptom of bronchitis.  In acute bronchitis, the condition usually develops suddenly and goes away over time, usually in a couple weeks. Smoking, allergies, and asthma can make bronchitis worse. Repeated episodes of bronchitis may cause further lung problems.  CAUSES Acute bronchitis is most often caused by the same virus that causes a cold. The virus can spread from person to person (contagious) through coughing, sneezing, and touching contaminated objects. SIGNS AND SYMPTOMS   Cough.   Fever.   Coughing up mucus.   Body aches.   Chest congestion.   Chills.   Shortness of breath.   Sore throat.  DIAGNOSIS  Acute bronchitis is usually diagnosed through a physical exam. Your health care provider will also ask you questions about your medical history. Tests, such as chest X-rays, are sometimes done to rule out other conditions.  TREATMENT  Acute bronchitis usually goes away in a couple weeks. Oftentimes, no medical treatment is necessary. Medicines are sometimes given for relief of fever or cough. Antibiotic medicines are usually not needed but may be prescribed in certain situations. In some cases, an inhaler may be recommended to help reduce shortness of breath and control the cough. A cool mist vaporizer may also be used to help thin bronchial secretions and make it easier to clear the chest.  HOME CARE INSTRUCTIONS  Get plenty of rest.   Drink enough fluids to  keep your urine clear or pale yellow (unless you have a medical condition that requires fluid restriction). Increasing fluids may help thin your respiratory secretions (sputum) and reduce chest congestion, and it will prevent dehydration.   Take medicines only as directed by your health care provider.  If you were prescribed an antibiotic medicine, finish it all even if you start to feel better.  Avoid smoking and secondhand smoke. Exposure to cigarette smoke or irritating chemicals will make bronchitis worse. If you are a smoker, consider using nicotine gum or skin patches to help control withdrawal symptoms. Quitting smoking will help your lungs heal faster.   Reduce the chances of another bout of acute bronchitis by washing your hands frequently, avoiding people with cold symptoms, and trying not to touch your hands to your mouth, nose, or eyes.   Keep all follow-up visits as directed by your health care provider.  SEEK MEDICAL CARE IF: Your symptoms do not improve after 1 week of treatment.  SEEK IMMEDIATE MEDICAL CARE IF:  You develop an increased fever or chills.   You have chest pain.   You have severe shortness of breath.  You have bloody sputum.   You develop dehydration.  You faint or repeatedly feel like you are going to pass out.  You develop repeated vomiting.  You develop a severe headache. MAKE SURE YOU:   Understand these instructions.  Will watch your condition.  Will get help right away if you are not doing well or get worse.   This information is not intended to  replace advice given to you by your health care provider. Make sure you discuss any questions you have with your health care provider.   Document Released: 05/08/2004 Document Revised: 04/21/2014 Document Reviewed: 09/21/2012 Elsevier Interactive Patient Education [employer].

## 2016-01-22 NOTE — Progress Notes (Signed)
David Armstrong is a 65 y.o. male who presents to [facility] today for fevers chills cough and congestion. Symptoms present for 5 days.  Symptoms are somewhat consistent with previous episodes of pneumonia. The cough is nonproductive and bothersome. He's tried some over-the-counter medicines that it helps some. He also notes a mild voice hoarseness as well.   Past Medical History:  Diagnosis Date  . Anal fissure   . ANXIETY   . Arthritis   . Chronic constipation   . DEPRESSION   . DIVERTICULOSIS, COLON [phone]   Colonoscopy  . Fatty liver   . GERD   . H. pylori infection   . Hemorrhoids [id number]   Colonoscopy   . Hiatal hernia [id number]   EGD  . HYPERLIPIDEMIA   . IBS (irritable bowel syndrome)   . Internal hemorrhoids   . Peripheral neuropathy ([place])   . Personal history of colonic polyps 10/27/2007   HYPERPLASTIC POLYP  . RENAL CALCULUS   . TMJ (temporomandibular joint syndrome)   . Unspecified gastritis and gastroduodenitis without mention of hemorrhage 2009   EGD   Past Surgical History:  Procedure Laterality Date  . Eye lid surgery Bilateral 01/2014  . FOOT NEUROMA SURGERY Right   . HAMMER TOE SURGERY Left   . HEEL SPUR SURGERY Bilateral   . KNEE SURGERY Left   . LITHOTRIPSY    . PARTIAL KNEE ARTHROPLASTY Right   . PATELLA ARTHROPLASTY Right 07/17/2003  . SHOULDER SURGERY Bilateral 07/2005, 03/2011   left x 2   Social History  Substance Use Topics  . Smoking status: Never Smoker  . Smokeless tobacco: Never Used  . Alcohol use No   family history includes Cirrhosis in his father; Heart disease in his brother, father, and mother; Parkinson's disease in his paternal grandfather; Prostate cancer in his maternal grandfather; Skin cancer in his father; Stroke in his mother.  ROS as above:  Medications: Current Outpatient Prescriptions    Medication Sig Dispense Refill  . azithromycin (ZITHROMAX) 250 MG tablet Take 1 tablet (250 mg total) by mouth daily. Take first 2 tablets together, then 1 every day until finished. 6 tablet 0  . benzonatate (TESSALON) 200 MG capsule Take 1 capsule (200 mg total) by mouth 3 (three) times daily as needed for cough. 45 capsule 0  . dicyclomine (BENTYL) 10 MG capsule Take 1 capsule (10 mg total) by mouth 4 (four) times daily -  before meals and at bedtime. 90 capsule 1  . ipratropium (ATROVENT) 0.06 % nasal spray Place 2 sprays into both nostrils every 4 (four) hours as needed for rhinitis. 10 mL 6  . linaclotide (LINZESS) 290 MCG CAPS capsule Take 1 capsule (290 mcg total) by mouth daily before breakfast. 30 capsule 2  . LORazepam (ATIVAN) 1 MG tablet Take 1 mg by mouth every 8 (eight) hours as needed for anxiety.     . ranitidine (ZANTAC) 150 MG tablet Take 1 tablet (150 mg total) by mouth at bedtime. 30 tablet 3  . topiramate (TOPAMAX) 25 MG tablet Take 1 tablet (25 mg total) by mouth 2 (two) times daily. For three weeks to reduce headache. 42 tablet 0   No current facility-administered medications for this visit.    Allergies  Allergen Reactions  . Dexilant [Dexlansoprazole] Other (See Comments)    Muscle aches  . Soma [Carisoprodol]     Sores on arm  . Sulfamethoxazole  REACTION: unspecified  . Ivp Dye [Iodinated Diagnostic Agents] Rash     Exam:  BP 117/79   Pulse (!) 103   Temp 97.7 F (36.5 C) (Oral)   Wt 173 lb (78.5 kg)   SpO2 94%   BMI 24.65 kg/m  Gen: Well NAD Nontoxic appearing HEENT: EOMI,  MMM Posterior pharynx with mild cobblestoning. Clear nasal discharge is present. Cervical lymphadenopathy is present. Lungs: Normal work of breathing. CTABL Heart: RRR no MRG heart rate 90 bpm per my check Abd: NABS, Soft. Nondistended, Nontender Exts: Brisk capillary refill, warm and well perfused.   Preliminary chest x-ray does not show pneumonia. Awaiting formal radiology  review No results found for this or any previous visit (from the past 24 hour(s)). No results found.    Assessment and Plan: 65 y.o. male with viral URI with possible bronchitis. Symptomatic management with Atrovent nasal spray Tessalon Perles. Additionally continue over-the-counter medication. Use azithromycin antibiotics as backup if worsening. Chest x-ray pending.   Orders Placed This Encounter  Procedures  . DG Chest 2 View    Order Specific Question:   Reason for exam:    Answer:   Cough, assess intra-thoracic pathology    Order Specific Question:   Preferred imaging location?    Answer:   [facility]    Discussed warning signs or symptoms. Please see discharge instructions. Patient expresses understanding.

## 2016-01-29 ENCOUNTER — Encounter: Payer: Self-pay | Admitting: Sports Medicine

## 2016-01-29 ENCOUNTER — Ambulatory Visit (INDEPENDENT_AMBULATORY_CARE_PROVIDER_SITE_OTHER): Payer: Medicare Other | Admitting: Sports Medicine

## 2016-01-29 ENCOUNTER — Ambulatory Visit (INDEPENDENT_AMBULATORY_CARE_PROVIDER_SITE_OTHER): Payer: Medicare Other

## 2016-01-29 DIAGNOSIS — R05 Cough: Secondary | ICD-10-CM

## 2016-01-29 DIAGNOSIS — J209 Acute bronchitis, unspecified: Secondary | ICD-10-CM | POA: Diagnosis not present

## 2016-01-29 DIAGNOSIS — R0602 Shortness of breath: Secondary | ICD-10-CM

## 2016-01-29 DIAGNOSIS — H6121 Impacted cerumen, right ear: Secondary | ICD-10-CM | POA: Diagnosis not present

## 2016-01-29 DIAGNOSIS — H2511 Age-related nuclear cataract, right eye: Secondary | ICD-10-CM | POA: Diagnosis not present

## 2016-01-29 DIAGNOSIS — H25041 Posterior subcapsular polar age-related cataract, right eye: Secondary | ICD-10-CM | POA: Diagnosis not present

## 2016-01-29 DIAGNOSIS — R0989 Other specified symptoms and signs involving the circulatory and respiratory systems: Secondary | ICD-10-CM

## 2016-01-29 DIAGNOSIS — H2512 Age-related nuclear cataract, left eye: Secondary | ICD-10-CM | POA: Diagnosis not present

## 2016-01-29 NOTE — Assessment & Plan Note (Signed)
Removal by physician with instrumentation.

## 2016-01-29 NOTE — Assessment & Plan Note (Signed)
Clinically well. We are going to repeat a chest x-ray, we can see him back as needed. He will discontinue his nasal Atrovent and use Flonase instead.

## 2016-01-29 NOTE — Progress Notes (Signed)
  Subjective:    CC: shortness of breath   HPI: 65 yo presenting for re-evaluation of shortness of breath.  Patient was in his normal state of health until two weeks ago when developed fever, chills, cough and congestion.  He was diagnosed with bronchitis after a CXR ruled-out pneumonia.  He was given a prescription for Atrovent and azithromycin if symptoms did not resolve.  He finished a course of Azithromycin three days ago but did not think it helped with his symptoms.  He only used the Atrovent a couple of times because it made his mouth dry.  He has been using flonase.  Today, he is complaining of shortness of breath, unchanged over the past week.  All other symptoms have resolved, with the exception of an intermittent cough and some congestion.  No recent long car-rides or immobilization.   Past medical history:  Negative.  See flowsheet/record as well for more information.  Surgical history: Negative.  See flowsheet/record as well for more information.  Family history: Negative.  See flowsheet/record as well for more information.  Social history: Negative.  See flowsheet/record as well for more information.  Allergies, and medications have been entered into the medical record, reviewed, and no changes needed.   Review of Systems: No fevers, chills, night sweats, weight loss, chest pain, or shortness of breath.   Objective:    General: Well Developed, well nourished, and in no acute distress.  Neuro: Alert and oriented x3, extra-ocular muscles intact, sensation grossly intact.  HEENT: Normocephalic, atraumatic, pupils equal round reactive to light, neck supple, no masses, no lymphadenopathy, thyroid nonpalpable.  Skin: Warm and dry, no rashes.  No lower extremity edema.  Cardiac: Regular rate and rhythm, no murmurs rubs or gallops, no lower extremity edema.  Respiratory: Clear to auscultation bilaterally. Not using accessory muscles, speaking in full sentences.   Impression and  Recommendations:    1. Shortness of breath: This likely represents the tail-end of an episode of bronchitis.  Oxygen sats are 98% ORA and lung exam is unremarkable.  Negative Homan sign.  -Continue Flonase.  Stop Atrovent.  -Repeat CXR today

## 2016-02-04 DIAGNOSIS — H6983 Other specified disorders of Eustachian tube, bilateral: Secondary | ICD-10-CM | POA: Insufficient documentation

## 2016-02-04 DIAGNOSIS — J302 Other seasonal allergic rhinitis: Secondary | ICD-10-CM | POA: Diagnosis not present

## 2016-02-05 ENCOUNTER — Ambulatory Visit (INDEPENDENT_AMBULATORY_CARE_PROVIDER_SITE_OTHER): Payer: Medicare Other | Admitting: Osteopathic Medicine

## 2016-02-05 ENCOUNTER — Ambulatory Visit (INDEPENDENT_AMBULATORY_CARE_PROVIDER_SITE_OTHER): Payer: Medicare Other

## 2016-02-05 ENCOUNTER — Encounter: Payer: Self-pay | Admitting: Osteopathic Medicine

## 2016-02-05 DIAGNOSIS — M545 Low back pain, unspecified: Secondary | ICD-10-CM

## 2016-02-05 DIAGNOSIS — R102 Pelvic and perineal pain: Secondary | ICD-10-CM

## 2016-02-05 DIAGNOSIS — M5137 Other intervertebral disc degeneration, lumbosacral region: Secondary | ICD-10-CM | POA: Diagnosis not present

## 2016-02-05 DIAGNOSIS — M5136 Other intervertebral disc degeneration, lumbar region: Secondary | ICD-10-CM | POA: Diagnosis not present

## 2016-02-05 MED ORDER — TIZANIDINE HCL 4 MG PO TABS: 4.0000 mg | ORAL_TABLET | Freq: Four times a day (QID) | ORAL | 0 refills | Status: DC | PRN

## 2016-02-05 MED ORDER — OMEPRAZOLE 20 MG PO CPDR: 20.0000 mg | DELAYED_RELEASE_CAPSULE | Freq: Every day | ORAL | 0 refills | Status: DC

## 2016-02-05 MED ORDER — NAPROXEN 500 MG PO TABS: 500.0000 mg | ORAL_TABLET | Freq: Two times a day (BID) | ORAL | 0 refills | Status: DC

## 2016-02-05 NOTE — Progress Notes (Signed)
HPI: David Armstrong is a 65 y.o. male  who presents to [facility] today, 02/05/16,  for chief complaint of:  Chief Complaint  Patient presents with  . Establish Care    SWITCHING FROM HOMMEL/ right leg pain    . Context: Thinks may have injured himself/twisted back climbing stairs the other day . Location: right lower back and leg  . Quality: Soreness, no numbness or weakness . Duration: 2 weeks  . Timing: Intermittent, worse with twisting . Modifying factors: chiropractor didn't help much, went yesterday. Going to neurologist - no records available, patient states history of degenerative disc disease, previously receiving what sounds like epidural spinal injections from neurologist.   Past medical, surgical, social and family history reviewed: Past Medical History:  Diagnosis Date  . Anal fissure   . ANXIETY   . Arthritis   . Chronic constipation   . DEPRESSION   . DIVERTICULOSIS, COLON [phone]   Colonoscopy  . Fatty liver   . GERD   . H. pylori infection   . Hemorrhoids [id number]   Colonoscopy   . Hiatal hernia [id number]   EGD  . HYPERLIPIDEMIA   . IBS (irritable bowel syndrome)   . Internal hemorrhoids   . Peripheral neuropathy ([place])   . Personal history of colonic polyps 10/27/2007   HYPERPLASTIC POLYP  . RENAL CALCULUS   . TMJ (temporomandibular joint syndrome)   . Unspecified gastritis and gastroduodenitis without mention of hemorrhage 2009   EGD   Past Surgical History:  Procedure Laterality Date  . Eye lid surgery Bilateral 01/2014  . FOOT NEUROMA SURGERY Right   . HAMMER TOE SURGERY Left   . HEEL SPUR SURGERY Bilateral   . KNEE SURGERY Left   . LITHOTRIPSY    . PARTIAL KNEE ARTHROPLASTY Right   . PATELLA ARTHROPLASTY Right 07/17/2003  . SHOULDER SURGERY Bilateral 07/2005, 03/2011   left x 2   Social History  Substance Use Topics  . Smoking status: Never Smoker  . Smokeless tobacco: Never Used  . Alcohol  use No   Family History  Problem Relation Age of Onset  . Heart disease Mother   . Stroke Mother   . Cirrhosis Father   . Heart disease Father   . Skin cancer Father   . Prostate cancer Maternal Grandfather   . Parkinson's disease Paternal Grandfather   . Heart disease Brother   . Colon cancer Neg Hx      Current medication list and allergy/intolerance information reviewed:   Current Outpatient Prescriptions on File Prior to Visit  Medication Sig Dispense Refill  . dicyclomine (BENTYL) 10 MG capsule Take 1 capsule (10 mg total) by mouth 4 (four) times daily -  before meals and at bedtime. 90 capsule 1  . ipratropium (ATROVENT) 0.06 % nasal spray Place 2 sprays into both nostrils every 4 (four) hours as needed for rhinitis. 10 mL 6  . linaclotide (LINZESS) 290 MCG CAPS capsule Take 1 capsule (290 mcg total) by mouth daily before breakfast. 30 capsule 2  . LORazepam (ATIVAN) 1 MG tablet Take 1 mg by mouth every 8 (eight) hours as needed for anxiety.     . ranitidine (ZANTAC) 150 MG tablet Take 1 tablet (150 mg total) by mouth at bedtime. 30 tablet 3  . topiramate (TOPAMAX) 25 MG tablet Take 1 tablet (25 mg total) by mouth 2 (two) times daily. For three weeks to reduce headache. 42 tablet 0   No current facility-administered  medications on file prior to visit.    Allergies  Allergen Reactions  . Dexilant [Dexlansoprazole] Other (See Comments)    Muscle aches  . Soma [Carisoprodol]     Sores on arm  . Sulfamethoxazole     REACTION: unspecified  . Ivp Dye [Iodinated Diagnostic Agents] Rash      Review of Systems:  Constitutional: No recent illness  HEENT: No  headache, no vision change  Cardiac: No  chest pain, No  pressure, No palpitations  Respiratory:  No  shortness of breath. No  Cough  Gastrointestinal: No  abdominal pain, no change on bowel habits  Musculoskeletal: +new myalgia/arthralgia  Skin: No  Rash  Neurologic: No  weakness, No  Dizziness   Exam:  BP  126/86   Pulse 92   Ht 6' (1.829 m)   Wt 175 lb (79.4 kg)   BMI 23.73 kg/m   Constitutional: VS see above. General Appearance: alert, well-developed, well-nourished, NAD  Eyes: Normal lids and conjunctive, non-icteric sclera  Ears, Nose, Mouth, Throat: MMM, Normal external inspection ears/nares/mouth/lips/gums.  Neck: No masses, trachea midline.   Respiratory: Normal respiratory effort. no wheeze, no rhonchi, no rales  Cardiovascular: S1/S2 normal, no murmur, no rub/gallop auscultated. RRR.   Musculoskeletal: Gait normal. Symmetric and independent movement of all extremities  Negative straight leg raise bilaterally  Normal strength 5/5 hip flexion, knee flexion, dorsiflexion and plantar flexion bilaterally  Sensation intact bilaterally  Paraspinal tenderness to right L5/S1 area  No tenderness to trochanteric bursa on R  Neurological: Normal balance/coordination. No tremor.  Skin: warm, dry, intact.   Psychiatric: Normal judgment/insight. Normal mood and affect. Oriented x3.      ASSESSMENT/PLAN:   Acute right-sided low back pain without sciatica - Plan: naproxen (NAPROSYN) 500 MG tablet, omeprazole (PRILOSEC) 20 MG capsule, DG Lumbar Spine Complete, DG Pelvis 1-2 Views, Ambulatory referral to Physical Therapy, tiZANidine (ZANAFLEX) 4 MG tablet   Physical therapy, anti-inflammatories with GI protection since patient reports some GI upset with previous NSAID use. X-rays for further evaluation, most likely suspect arthritis, may need to repeat MRI if no improvement in 4-6 weeks with home exercises and physical therapy. Patient given print outs for home exercises for low back pain and SI joint pain.    Visit summary with medication list and pertinent instructions was printed for patient to review. All questions at time of visit were answered - patient instructed to contact office with any additional concerns. ER/RTC precautions were reviewed with the patient. Follow-up  plan: Return in about 4 weeks (around 03/04/2016) for back pain if still bothering you, sooner if worse or changes, and when due for annual physical.

## 2016-02-08 ENCOUNTER — Ambulatory Visit (INDEPENDENT_AMBULATORY_CARE_PROVIDER_SITE_OTHER): Payer: Medicare Other | Admitting: Sports Medicine

## 2016-02-08 ENCOUNTER — Encounter: Payer: Self-pay | Admitting: Sports Medicine

## 2016-02-08 DIAGNOSIS — R0602 Shortness of breath: Secondary | ICD-10-CM | POA: Diagnosis not present

## 2016-02-08 LAB — CBC
HCT: 42.8 % (ref 38.5–50.0)
Hemoglobin: 14.5 g/dL (ref 13.2–17.1)
MCH: 30.7 pg (ref 27.0–33.0)
MCHC: 33.9 g/dL (ref 32.0–36.0)
MCV: 90.5 fL (ref 80.0–100.0)
MPV: 9.2 fL (ref 7.5–12.5)
Platelets: 150 K/uL (ref 140–400)
RBC: 4.73 MIL/uL (ref 4.20–5.80)
RDW: 13.7 % (ref 11.0–15.0)
WBC: 7 K/uL (ref 3.8–10.8)

## 2016-02-08 NOTE — Progress Notes (Signed)
  Subjective:    CC:  Shortness of breath  HPI: This is a pleasant 65 year old male, he's been seen here several times for bronchitis, cough, shortness of breath, more recently he complains of shortness of breath with laying flat. Denies any PND, no chest pain. Also has occasional episodes of feeling as though his heart is beating faster. He does take Ativan at night.  Past medical history:  Negative.  See flowsheet/record as well for more information.  Surgical history: Negative.  See flowsheet/record as well for more information.  Family history: Negative.  See flowsheet/record as well for more information.  Social history: Negative.  See flowsheet/record as well for more information.  Allergies, and medications have been entered into the medical record, reviewed, and no changes needed.   Review of Systems: No fevers, chills, night sweats, weight loss, chest pain, or shortness of breath.   Objective:    General: Well Developed, well nourished, and in no acute distress.  Neuro: Alert and oriented x3, extra-ocular muscles intact, sensation grossly intact.  HEENT: Normocephalic, atraumatic, pupils equal round reactive to light, neck supple, no masses, no lymphadenopathy, thyroid nonpalpable. Oropharynx, nasopharynx, ear canals are unremarkable. Skin: Warm and dry, no rashes. Cardiac: Regular rate and rhythm, no murmurs rubs or gallops, no lower extremity edema.  Respiratory: Clear to auscultation bilaterally. Not using accessory muscles, speaking in full sentences.  Impression and Recommendations:    Shortness of breath At this point Beckett has presented enough times for [place] to go ahead and pull trigger for a more aggressive workup. This will include CBC, CMP, d-dimer, BNP. Sleep study, he will return for pre-and postbronchodilator spirometry. Ultimately this likely simply represents anxiety, and would likely be well controlled with trazodone at bedtime.

## 2016-02-08 NOTE — Assessment & Plan Note (Addendum)
At this point David Armstrong has presented enough times for [place] to go ahead and pull trigger for a more aggressive workup. This will include CBC, CMP, d-dimer, BNP. Sleep study, he will return for pre-and postbronchodilator spirometry. Ultimately this likely simply represents anxiety, and would likely be well controlled with trazodone at bedtime.  Elevated d-dimer, checking a stat CT angiogram of the pulmonary arteries. Does get a rash with older iodinated contrast agents. I'm going to use the premedication protocol and he can have his CT tomorrow.

## 2016-02-09 LAB — COMPREHENSIVE METABOLIC PANEL
Albumin: 3.8 g/dL (ref 3.6–5.1)
CO2: 26 mmol/L (ref 20–31)
Calcium: 8.7 mg/dL (ref 8.6–10.3)
Chloride: 107 mmol/L (ref 98–110)
Creat: 0.84 mg/dL (ref 0.70–1.25)
Potassium: 4.1 mmol/L (ref 3.5–5.3)
Sodium: 141 mmol/L (ref 135–146)
Total Protein: 6.5 g/dL (ref 6.1–8.1)

## 2016-02-09 LAB — COMPREHENSIVE METABOLIC PANEL WITH GFR
ALT: 15 U/L (ref 9–46)
AST: 18 U/L (ref 10–35)
Alkaline Phosphatase: 96 U/L (ref 40–115)
BUN: 16 mg/dL (ref 7–25)
Glucose, Bld: 76 mg/dL (ref 65–99)
Total Bilirubin: 0.6 mg/dL (ref 0.2–1.2)

## 2016-02-09 LAB — BRAIN NATRIURETIC PEPTIDE: Brain Natriuretic Peptide: 94.9 pg/mL (ref <100)

## 2016-02-11 ENCOUNTER — Inpatient Hospital Stay (HOSPITAL_BASED_OUTPATIENT_CLINIC_OR_DEPARTMENT_OTHER)
Admission: EM | Admit: 2016-02-11 | Discharge: 2016-02-14 | DRG: 176 | Disposition: A | Discharge status: Home / Self Care | Payer: Medicare Other | Attending: Internal Medicine | Admitting: Internal Medicine

## 2016-02-11 ENCOUNTER — Emergency Department (HOSPITAL_BASED_OUTPATIENT_CLINIC_OR_DEPARTMENT_OTHER): Payer: Medicare Other

## 2016-02-11 ENCOUNTER — Encounter (HOSPITAL_BASED_OUTPATIENT_CLINIC_OR_DEPARTMENT_OTHER): Payer: Self-pay | Admitting: Emergency Medicine

## 2016-02-11 DIAGNOSIS — M545 Low back pain: Secondary | ICD-10-CM | POA: Diagnosis present

## 2016-02-11 DIAGNOSIS — I82411 Acute embolism and thrombosis of right femoral vein: Secondary | ICD-10-CM | POA: Diagnosis not present

## 2016-02-11 DIAGNOSIS — Z8249 Family history of ischemic heart disease and other diseases of the circulatory system: Secondary | ICD-10-CM

## 2016-02-11 DIAGNOSIS — F411 Generalized anxiety disorder: Secondary | ICD-10-CM | POA: Diagnosis not present

## 2016-02-11 DIAGNOSIS — K5909 Other constipation: Secondary | ICD-10-CM | POA: Diagnosis present

## 2016-02-11 DIAGNOSIS — Z87442 Personal history of urinary calculi: Secondary | ICD-10-CM

## 2016-02-11 DIAGNOSIS — Z96651 Presence of right artificial knee joint: Secondary | ICD-10-CM | POA: Diagnosis present

## 2016-02-11 DIAGNOSIS — Z86718 Personal history of other venous thrombosis and embolism: Secondary | ICD-10-CM

## 2016-02-11 DIAGNOSIS — H0232 Blepharochalasis right lower eyelid: Secondary | ICD-10-CM | POA: Diagnosis not present

## 2016-02-11 DIAGNOSIS — E785 Hyperlipidemia, unspecified: Secondary | ICD-10-CM | POA: Diagnosis present

## 2016-02-11 DIAGNOSIS — R634 Abnormal weight loss: Secondary | ICD-10-CM | POA: Diagnosis present

## 2016-02-11 DIAGNOSIS — Z86010 Personal history of colonic polyps: Secondary | ICD-10-CM

## 2016-02-11 DIAGNOSIS — G629 Polyneuropathy, unspecified: Secondary | ICD-10-CM | POA: Diagnosis present

## 2016-02-11 DIAGNOSIS — Z8701 Personal history of pneumonia (recurrent): Secondary | ICD-10-CM

## 2016-02-11 DIAGNOSIS — Z6823 Body mass index (BMI) 23.0-23.9, adult: Secondary | ICD-10-CM | POA: Diagnosis not present

## 2016-02-11 DIAGNOSIS — G8929 Other chronic pain: Secondary | ICD-10-CM | POA: Diagnosis present

## 2016-02-11 DIAGNOSIS — R0602 Shortness of breath: Secondary | ICD-10-CM | POA: Diagnosis not present

## 2016-02-11 DIAGNOSIS — Z79899 Other long term (current) drug therapy: Secondary | ICD-10-CM

## 2016-02-11 DIAGNOSIS — I2699 Other pulmonary embolism without acute cor pulmonale: Principal | ICD-10-CM | POA: Diagnosis present

## 2016-02-11 DIAGNOSIS — K449 Diaphragmatic hernia without obstruction or gangrene: Secondary | ICD-10-CM | POA: Diagnosis present

## 2016-02-11 DIAGNOSIS — F419 Anxiety disorder, unspecified: Secondary | ICD-10-CM | POA: Diagnosis present

## 2016-02-11 DIAGNOSIS — Z86711 Personal history of pulmonary embolism: Secondary | ICD-10-CM | POA: Diagnosis present

## 2016-02-11 DIAGNOSIS — I82412 Acute embolism and thrombosis of left femoral vein: Secondary | ICD-10-CM | POA: Diagnosis not present

## 2016-02-11 DIAGNOSIS — H01022 Squamous blepharitis right lower eyelid: Secondary | ICD-10-CM | POA: Diagnosis not present

## 2016-02-11 DIAGNOSIS — K219 Gastro-esophageal reflux disease without esophagitis: Secondary | ICD-10-CM | POA: Diagnosis present

## 2016-02-11 DIAGNOSIS — H0235 Blepharochalasis left lower eyelid: Secondary | ICD-10-CM | POA: Diagnosis not present

## 2016-02-11 DIAGNOSIS — I824Y1 Acute embolism and thrombosis of unspecified deep veins of right proximal lower extremity: Secondary | ICD-10-CM | POA: Diagnosis not present

## 2016-02-11 DIAGNOSIS — I82401 Acute embolism and thrombosis of unspecified deep veins of right lower extremity: Secondary | ICD-10-CM | POA: Diagnosis present

## 2016-02-11 DIAGNOSIS — M79661 Pain in right lower leg: Secondary | ICD-10-CM

## 2016-02-11 DIAGNOSIS — Z91041 Radiographic dye allergy status: Secondary | ICD-10-CM | POA: Diagnosis not present

## 2016-02-11 DIAGNOSIS — K760 Fatty (change of) liver, not elsewhere classified: Secondary | ICD-10-CM | POA: Diagnosis present

## 2016-02-11 DIAGNOSIS — Z882 Allergy status to sulfonamides status: Secondary | ICD-10-CM

## 2016-02-11 DIAGNOSIS — K589 Irritable bowel syndrome without diarrhea: Secondary | ICD-10-CM | POA: Diagnosis present

## 2016-02-11 DIAGNOSIS — F319 Bipolar disorder, unspecified: Secondary | ICD-10-CM | POA: Diagnosis present

## 2016-02-11 DIAGNOSIS — I824Z1 Acute embolism and thrombosis of unspecified deep veins of right distal lower extremity: Secondary | ICD-10-CM | POA: Diagnosis present

## 2016-02-11 DIAGNOSIS — Z888 Allergy status to other drugs, medicaments and biological substances status: Secondary | ICD-10-CM | POA: Diagnosis not present

## 2016-02-11 HISTORY — DX: Bipolar disorder, unspecified: F31.9

## 2016-02-11 HISTORY — DX: Personal history of urinary calculi: Z87.442

## 2016-02-11 HISTORY — DX: Personal history of pulmonary embolism: Z86.711

## 2016-02-11 HISTORY — DX: Personal history of other venous thrombosis and embolism: Z86.718

## 2016-02-11 LAB — CBC WITH DIFFERENTIAL/PLATELET
BASOS ABS: 0 10*3/uL (ref 0.0–0.1)
BASOS PCT: 0 %
Eosinophils Absolute: 0.1 10*3/uL (ref 0.0–0.7)
Eosinophils Relative: 1 %
HEMATOCRIT: 43.4 % (ref 39.0–52.0)
HEMOGLOBIN: 15 g/dL (ref 13.0–17.0)
Lymphocytes Relative: 20 %
Lymphs Abs: 1.3 10*3/uL (ref 0.7–4.0)
MCH: 30.6 pg (ref 26.0–34.0)
MCHC: 34.6 g/dL (ref 30.0–36.0)
MCV: 88.6 fL (ref 78.0–100.0)
Monocytes Absolute: 0.7 10*3/uL (ref 0.1–1.0)
Monocytes Relative: 11 %
NEUTROS ABS: 4.5 10*3/uL (ref 1.7–7.7)
NEUTROS PCT: 68 %
Platelets: 169 10*3/uL (ref 150–400)
RBC: 4.9 MIL/uL (ref 4.22–5.81)
RDW: 13.6 % (ref 11.5–15.5)
WBC: 6.6 10*3/uL (ref 4.0–10.5)

## 2016-02-11 LAB — MRSA PCR SCREENING: MRSA BY PCR: NEGATIVE

## 2016-02-11 LAB — BASIC METABOLIC PANEL
ANION GAP: 7 (ref 5–15)
BUN: 15 mg/dL (ref 6–20)
CALCIUM: 8.5 mg/dL — AB (ref 8.9–10.3)
CO2: 25 mmol/L (ref 22–32)
Chloride: 107 mmol/L (ref 101–111)
Creatinine, Ser: 0.83 mg/dL (ref 0.61–1.24)
Glucose, Bld: 134 mg/dL — ABNORMAL HIGH (ref 65–99)
Potassium: 3.4 mmol/L — ABNORMAL LOW (ref 3.5–5.1)
Sodium: 139 mmol/L (ref 135–145)

## 2016-02-11 LAB — BRAIN NATRIURETIC PEPTIDE: B NATRIURETIC PEPTIDE 5: 24.6 pg/mL (ref 0.0–100.0)

## 2016-02-11 LAB — D-DIMER, QUANTITATIVE: D-Dimer, Quant: 13.58 ug{FEU}/mL — ABNORMAL HIGH (ref <0.50)

## 2016-02-11 LAB — TROPONIN I: Troponin I: <0.03 ng/mL (ref <0.03)

## 2016-02-11 MED ORDER — HYDROMORPHONE HCL 1 MG/ML IJ SOLN: 0.5000 mg | INTRAMUSCULAR | Status: DC | PRN

## 2016-02-11 MED ORDER — ONDANSETRON HCL 4 MG/2ML IJ SOLN: 4.0000 mg | Freq: Four times a day (QID) | INTRAMUSCULAR | Status: DC | PRN

## 2016-02-11 MED ORDER — IOPAMIDOL (ISOVUE-370) INJECTION 76%
100.0000 mL | Freq: Once | INTRAVENOUS | Status: AC | PRN
  Administered 2016-02-11: 100 mL via INTRAVENOUS

## 2016-02-11 MED ORDER — TIZANIDINE HCL 4 MG PO TABS
4.0000 mg | ORAL_TABLET | Freq: Four times a day (QID) | ORAL | Status: DC | PRN
  Filled 2016-02-11 (×2): qty 1

## 2016-02-11 MED ORDER — ACETAMINOPHEN 650 MG RE SUPP: 650.0000 mg | Freq: Four times a day (QID) | RECTAL | Status: DC | PRN

## 2016-02-11 MED ORDER — HEPARIN BOLUS VIA INFUSION
4000.0000 [IU] | Freq: Once | INTRAVENOUS | Status: AC
  Administered 2016-02-11: 4000 [IU] via INTRAVENOUS

## 2016-02-11 MED ORDER — PREDNISONE 50 MG PO TABS: ORAL_TABLET | ORAL | 0 refills | Status: DC

## 2016-02-11 MED ORDER — ACETAMINOPHEN 325 MG PO TABS: 650.0000 mg | ORAL_TABLET | Freq: Four times a day (QID) | ORAL | Status: DC | PRN

## 2016-02-11 MED ORDER — PANTOPRAZOLE SODIUM 40 MG PO TBEC
40.0000 mg | DELAYED_RELEASE_TABLET | Freq: Every day | ORAL | Status: DC
  Administered 2016-02-12 – 2016-02-14 (×3): 40 mg via ORAL
  Filled 2016-02-11 (×3): qty 1

## 2016-02-11 MED ORDER — DICYCLOMINE HCL 10 MG PO CAPS
10.0000 mg | ORAL_CAPSULE | Freq: Three times a day (TID) | ORAL | Status: DC
  Administered 2016-02-13 – 2016-02-14 (×3): 10 mg via ORAL
  Filled 2016-02-11 (×4): qty 1

## 2016-02-11 MED ORDER — HEPARIN (PORCINE) IN NACL 100-0.45 UNIT/ML-% IJ SOLN
1250.0000 [IU]/h | INTRAMUSCULAR | Status: DC
  Administered 2016-02-11 – 2016-02-12 (×2): 1300 [IU]/h via INTRAVENOUS
  Administered 2016-02-13: 1250 [IU]/h via INTRAVENOUS
  Filled 2016-02-11 (×3): qty 250

## 2016-02-11 MED ORDER — TOPIRAMATE 25 MG PO TABS: 25.0000 mg | ORAL_TABLET | Freq: Two times a day (BID) | ORAL | Status: DC

## 2016-02-11 MED ORDER — ONDANSETRON HCL 4 MG PO TABS: 4.0000 mg | ORAL_TABLET | Freq: Four times a day (QID) | ORAL | Status: DC | PRN

## 2016-02-11 MED ORDER — SODIUM CHLORIDE 0.9 % IV SOLN
INTRAVENOUS | Status: AC
  Administered 2016-02-11 – 2016-02-12 (×2): via INTRAVENOUS

## 2016-02-11 MED ORDER — METHYLPREDNISOLONE SODIUM SUCC 125 MG IJ SOLR
125.0000 mg | Freq: Once | INTRAMUSCULAR | Status: AC
  Administered 2016-02-11: 125 mg via INTRAVENOUS
  Filled 2016-02-11: qty 2

## 2016-02-11 MED ORDER — ONDANSETRON HCL 4 MG/2ML IJ SOLN: 4.0000 mg | Freq: Three times a day (TID) | INTRAMUSCULAR | Status: DC | PRN

## 2016-02-11 MED ORDER — LORAZEPAM 0.5 MG PO TABS
1.0000 mg | ORAL_TABLET | Freq: Three times a day (TID) | ORAL | Status: DC | PRN
  Administered 2016-02-11: 23:00:00 1 mg via ORAL
  Filled 2016-02-11: qty 2

## 2016-02-11 MED ORDER — DIPHENHYDRAMINE HCL 50 MG/ML IJ SOLN
25.0000 mg | Freq: Once | INTRAMUSCULAR | Status: AC
  Administered 2016-02-11: 25 mg via INTRAVENOUS
  Filled 2016-02-11: qty 1

## 2016-02-11 MED ORDER — LORAZEPAM 0.5 MG PO TABS
1.0000 mg | ORAL_TABLET | Freq: Three times a day (TID) | ORAL | Status: DC | PRN
  Administered 2016-02-12 – 2016-02-13 (×2): 1 mg via ORAL
  Filled 2016-02-11 (×2): qty 2

## 2016-02-11 NOTE — Addendum Note (Signed)
Addended by: Silverio Decamp on: 02/11/2016 12:15 PM   Modules accepted: Orders

## 2016-02-11 NOTE — H&P (Addendum)
History and Physical    David Armstrong [id number] DOB: [birth year] DOA: 02/11/2016  PCP: Emeterio Reeve, DO  Patient coming from: Home.  Chief Complaint: Shortness of breath and right calf pain.  HPI: David Armstrong is a 65 y.o. male has been experiencing shortness of breath and right calf pain for last 2 weeks. Patient states the shortness of breath increases on lying flat. Patient also has been having calf pain on walking. Patient noticed some swelling in the right calf. Since his shortness of breath and calf pain persisted he had gone to the urgent care center on Friday, October 27 and had blood work done. Today patient was instructed to come to the ER because patient's d-dimer was significantly elevated. CT angiogram of the chest and Dopplers of the lower extremity was done which was positive for bilateral pulmonary embolism with possible right heart strain and extensive DVT of the right lower extremity. Patient is hemodynamically stable and admitted for further management. Patient denies having recent long distance travel. Has not had any previous episodes of PE or DVT or any family history of venous thromboembolism. Patient states in April he had foot surgery. Otherwise he has been always ambulatory.   ED Course: CT angiogram showed bilateral PE and Doppler showed right lower extremity DVT. Patient started on heparin. He had physician did discuss with pulmonary critical care.  Review of Systems: As per HPI, rest all negative.   Past Medical History:  Diagnosis Date  . Anal fissure   . ANXIETY   . Arthritis    "lower back; hips;' right knee" (02/11/2016)  . Bilateral pulmonary embolism ([place]) 02/11/2016  . Bipolar disorder ([place])   . Chronic constipation   . Chronic lower back pain   . DEPRESSION   . DIVERTICULOSIS, COLON [phone]   Colonoscopy  . DVT (deep venous thrombosis) ([place]) 02/11/2016   RLE  . Fatty liver   . GERD   . H. pylori infection   . Hemorrhoids  [id number]   Colonoscopy   . Hiatal hernia [id number]   EGD  . History of kidney stones   . HYPERLIPIDEMIA   . IBS (irritable bowel syndrome)   . Internal hemorrhoids   . Peripheral neuropathy ([place])   . Personal history of colonic polyps 10/27/2007   HYPERPLASTIC POLYP  . Pneumonia 12/2014  . TMJ (temporomandibular joint syndrome)   . Unspecified gastritis and gastroduodenitis without mention of hemorrhage 2009   EGD    Past Surgical History:  Procedure Laterality Date  . BELPHAROPTOSIS REPAIR Bilateral 01/2015   "droopy eyelids"  . CYSTOSCOPY/RETROGRADE/URETEROSCOPY/STONE EXTRACTION WITH BASKET  1987; 1992; 1999; 2010  . EXCISIONAL HEMORRHOIDECTOMY    . FOOT NEUROMA SURGERY Left 09/2015  . FOOT NEUROMA SURGERY Right 07/2015  . FOOT NEUROMA SURGERY Right 2004   "between big and 2nd toes; shortened my 2nd toe"  . FRACTURE SURGERY    . HAMMER TOE SURGERY Left    "little toe"  . INCISION AND DRAINAGE FOOT Left 1992   "ball of foot; stepped on piece of hard wire & it went thru my shoe"  . KNEE ARTHROSCOPY Bilateral   . LITHOTRIPSY  1992  . NASAL FRACTURE SURGERY  1997  . PARTIAL KNEE ARTHROPLASTY Right 06/2003  . PLANTAR FASCIA RELEASE Bilateral 1987-1993   right-left  . RHINOPLASTY  2000  . SHOULDER ARTHROSCOPY Left 03/2011; 2013  . SHOULDER ARTHROSCOPY Right 2007   "frozen shoulder"  . TOENAIL EXCISION Bilateral 2012   "big toes"  reports that he has never smoked. He has never used smokeless tobacco. He reports that he does not drink alcohol or use drugs.  Allergies  Allergen Reactions  . Dexilant [Dexlansoprazole] Other (See Comments)    Muscle aches  . Soma [Carisoprodol]     Sores on arm  . Sulfamethoxazole     REACTION: unspecified  . Ivp Dye [Iodinated Diagnostic Agents] Rash    Family History  Problem Relation Age of Onset  . Heart disease Mother   . Stroke Mother   . Cirrhosis Father   . Heart disease Father   . Skin cancer Father   . Prostate  cancer Maternal Grandfather   . Parkinson's disease Paternal Grandfather   . Heart disease Brother   . Colon cancer Neg Hx     Prior to Admission medications   Medication Sig Start Date End Date Taking? Authorizing Provider  dicyclomine (BENTYL) 10 MG capsule Take 1 capsule (10 mg total) by mouth 4 (four) times daily -  before meals and at bedtime. 07/03/15   Sean Hommel, DO  ipratropium (ATROVENT) 0.06 % nasal spray Place 2 sprays into both nostrils every 4 (four) hours as needed for rhinitis. 01/22/16   Gregor Hams, MD  linaclotide Rolan Lipa) 290 MCG CAPS capsule Take 1 capsule (290 mcg total) by mouth daily before breakfast. 11/23/15   Sean Hommel, DO  LORazepam (ATIVAN) 1 MG tablet Take 1 mg by mouth every 8 (eight) hours as needed for anxiety.  04/25/10   Historical Provider, MD  naproxen (NAPROSYN) 500 MG tablet Take 1 tablet (500 mg total) by mouth 2 (two) times daily with a meal. 02/05/16 02/04/17  Emeterio Reeve, DO  omeprazole (PRILOSEC) 20 MG capsule Take 1 capsule (20 mg total) by mouth daily. 02/05/16   Emeterio Reeve, DO  predniSONE (DELTASONE) 50 MG tablet 1 tab PO at 13h, 7h, and 1h before MRI. Take with 50mg  of Benadryl 1h before MRI. 02/11/16   Silverio Decamp, MD  tiZANidine (ZANAFLEX) 4 MG tablet Take 1 tablet (4 mg total) by mouth every 6 (six) hours as needed for muscle spasms. 02/05/16   Emeterio Reeve, DO  topiramate (TOPAMAX) 25 MG tablet Take 1 tablet (25 mg total) by mouth 2 (two) times daily. For three weeks to reduce headache. 11/23/15   Marcial Pacas, DO    Physical Exam: Vitals:   02/11/16 1900 02/11/16 1930 02/11/16 2047 02/11/16 2100  BP: 118/77 125/74 140/73   Pulse: 93 93 96 91  Resp: 22 17 19  (!) 21  Temp: 98.5 F (36.9 C)  97.8 F (36.6 C)   TempSrc: Oral  Oral   SpO2: 95% 96% 96% 96%  Weight:   77.1 kg (169 lb 15.6 oz)   Height:   6' (1.829 m)       Constitutional: Moderately built and nourished. Vitals:   02/11/16 1900 02/11/16  1930 02/11/16 2047 02/11/16 2100  BP: 118/77 125/74 140/73   Pulse: 93 93 96 91  Resp: 22 17 19  (!) 21  Temp: 98.5 F (36.9 C)  97.8 F (36.6 C)   TempSrc: Oral  Oral   SpO2: 95% 96% 96% 96%  Weight:   77.1 kg (169 lb 15.6 oz)   Height:   6' (1.829 m)    Eyes: Anicteric no pallor. ENMT: No discharge from the ears eyes nose and mouth. Neck: No mass felt. No JVD appreciated. Respiratory: No rhonchi or crepitations. Cardiovascular: S1 and S2 heard. No murmurs appreciated. Abdomen: Soft  nontender bowel sounds present. Musculoskeletal: Mild swelling of the right lower extremity. No joint effusion. Skin: No rash. Skin appears warm. Neurologic: Alert awake oriented to time place and person. Moves all extremities. Psychiatric: Appears normal. Normal affect.   Labs on Admission: I have personally reviewed following labs and imaging studies  CBC:  Recent Labs Lab 02/08/16 1400 02/11/16 1351  WBC 7.0 6.6  NEUTROABS  --  4.5  HGB 14.5 15.0  HCT 42.8 43.4  MCV 90.5 88.6  PLT 150 [id number]   Basic Metabolic Panel:  Recent Labs Lab 02/08/16 1400 02/11/16 1351  NA 141 139  K 4.1 3.4*  CL 107 107  CO2 26 25  GLUCOSE 76 134*  BUN 16 15  CREATININE 0.84 0.83  CALCIUM 8.7 8.5*   GFR: Estimated Creatinine Clearance: 98.1 mL/min (by C-G formula based on SCr of 0.83 mg/dL). Liver Function Tests:  Recent Labs Lab 02/08/16 1400  AST 18  ALT 15  ALKPHOS 96  BILITOT 0.6  PROT 6.5  ALBUMIN 3.8   No results for input(s): LIPASE, AMYLASE in the last 168 hours. No results for input(s): AMMONIA in the last 168 hours. Coagulation Profile: No results for input(s): INR, PROTIME in the last 168 hours. Cardiac Enzymes:  Recent Labs Lab 02/11/16 1630  TROPONINI <0.03   BNP (last 3 results) No results for input(s): PROBNP in the last 8760 hours. HbA1C: No results for input(s): HGBA1C in the last 72 hours. CBG: No results for input(s): GLUCAP in the last 168 hours. Lipid  Profile: No results for input(s): CHOL, HDL, LDLCALC, TRIG, CHOLHDL, LDLDIRECT in the last 72 hours. Thyroid Function Tests: No results for input(s): TSH, T4TOTAL, FREET4, T3FREE, THYROIDAB in the last 72 hours. Anemia Panel: No results for input(s): VITAMINB12, FOLATE, FERRITIN, TIBC, IRON, RETICCTPCT in the last 72 hours. Urine analysis:    Component Value Date/Time   COLORURINE YELLOW 01/14/2015 2229   APPEARANCEUR CLEAR 01/14/2015 2229   LABSPEC 1.017 01/14/2015 2229   PHURINE 7.5 01/14/2015 2229   GLUCOSEU NEGATIVE 01/14/2015 2229   GLUCOSEU NEGATIVE 10/26/2007 0957   HGBUR NEGATIVE 01/14/2015 2229   HGBUR negative 06/02/2008 1026   BILIRUBINUR NEGATIVE 01/14/2015 2229   BILIRUBINUR n 07/16/2011 1039   KETONESUR NEGATIVE 01/14/2015 2229   PROTEINUR NEGATIVE 01/14/2015 2229   UROBILINOGEN 1.0 01/14/2015 2229   NITRITE NEGATIVE 01/14/2015 2229   LEUKOCYTESUR NEGATIVE 01/14/2015 2229   Sepsis Labs: @LABRCNTIP (procalcitonin:4,lacticidven:4) )No results found for this or any previous visit (from the past 240 hour(s)).   Radiological Exams on Admission: Ct Angio Chest Pe W/cm &/or Wo Cm  Result Date: 02/11/2016 CLINICAL DATA:  Elevated D-dimer right calf pain and shortness of breath EXAM: CT ANGIOGRAPHY CHEST WITH CONTRAST TECHNIQUE: Multidetector CT imaging of the chest was performed using the standard protocol during bolus administration of intravenous contrast. Multiplanar CT image reconstructions and MIPs were obtained to evaluate the vascular anatomy. CONTRAST:  100 cc Isovue 370 intravenous COMPARISON:  Chest x-ray 01/29/2016 FINDINGS: Cardiovascular: Large filling defect present within the distal main right pulmonary artery consistent with pulmonary embolus. There is extensive from breasts also present within the right upper lobe pulmonary artery with extension of clot into segmental and subsegmental branches of the right upper lobe. There is additional thrombus within the  right middle lobe pulmonary artery, proximal. Additional thrombus present within right lower lobe pulmonary artery and extending into segmental and subsegmental branches. There is additional filling defect within the left lower lobe pulmonary artery with extension of  clot into segmental and subsegmental left lower lobe pulmonary arteries. Calculated RV LV ratio is 1.2 suggesting right heart strain. Mild ectasia of the ascending aorta. No aneurysm or dissection. Mediastinum/Nodes: No significant mediastinal or hilar adenopathy. No axillary adenopathy. Trachea and mainstem bronchi appear normal. The esophagus is within normal limits.Thyroid gland unremarkable. Lungs/Pleura: Lungs demonstrate patchy peripheral right upper lobe pulmonary opacities move which may represent infiltrates or small infarcts. Linear scarring in the apical segment left upper lobe. Patchy dependent atelectasis. No effusions. Within the anterior aspect of the right middle lobe, there is a 4 mm pulmonary nodule, series 5, image number 52. Upper Abdomen: Gallbladder appears contracted. No acute abnormality in the upper abdomen. Musculoskeletal: Mild superior endplate compression of T8. Review of the MIP images confirms the above findings. IMPRESSION: 1. Extensive acute pulmonary embolus within the distal right main pulmonary artery with extension into right upper lobe, right middle lobe and right lower lobe pulmonary arteries. Additional acute embolus present within left lower lobe pulmonary artery and segmental and subsegmental branches. Positive for acute PE with CT evidence of right heart strain (RV/LV Ratio = 1.2) consistent with at least submassive (intermediate risk) PE. The presence of right heart strain has been associated with an increased risk of morbidity and mortality. Please activate Code PE by paging [phone]. 2. Peripheral ground-glass densities in the right upper lobe suspected to represent pulmonary infarcts. Infiltrates are  also a possibility. 3. 4 mm lung nodule in the right middle lobe. No follow-up needed if patient is low-risk. Non-contrast chest CT can be considered in 12 months if patient is high-risk. This recommendation follows the consensus statement: Guidelines for Management of Incidental Pulmonary Nodules Detected on CT Images: From the Fleischner Society 2017; Radiology 2017; 284:228-243. Critical Value/emergent results were called by telephone at the time of interpretation on 02/11/2016 at 4:17 pm to Dr. Clayton Bibles , who verbally acknowledged these results. Electronically Signed   By: Donavan Foil M.D.   On: 02/11/2016 16:18   US Venous Img Lower Bilateral  Result Date: 02/11/2016 CLINICAL DATA:  Pulmonary embolus demonstrated earlier in the days. Lower extremity pain EXAM: BILATERAL LOWER EXTREMITY VENOUS DUPLEX ULTRASOUND TECHNIQUE: Gray-scale sonography with graded compression, as well as color Doppler and duplex ultrasound were performed to evaluate the lower extremity deep venous systems from the level of the common femoral vein and including the common femoral, femoral, profunda femoral, popliteal and calf veins including the posterior tibial, peroneal and gastrocnemius veins when visible. The superficial great saphenous vein was also interrogated. Spectral Doppler was utilized to evaluate flow at rest and with distal augmentation maneuvers in the common femoral, femoral and popliteal veins. COMPARISON:  None. FINDINGS: RIGHT LOWER EXTREMITY Common Femoral Vein: There is thrombus throughout the right common femoral vein which appears acute. There is only limited Doppler signal in this vessel and no compression or augmentation. Saphenofemoral Junction: No evidence of thrombus. Normal compressibility and flow on color Doppler imaging. Profunda Femoral Vein: There is acute appearing thrombus which appears occlusive. Absent Doppler signal. No compression or augmentation. Femoral Vein: There is acute appearing  thrombus which appears occlusive. Absent Doppler signal. No compression or augmentation. Popliteal Vein: There is acute appearing thrombus which appears occlusive. Absent Doppler signal. No compression or augmentation. Calf Veins: Thrombus is seen in the proximal posterior tibial vein. The peroneal vein appears normal. Superficial Great Saphenous Vein: No evidence of thrombus. Normal compressibility and flow on color Doppler imaging. Venous Reflux:  None. Other Findings:  None. LEFT  LOWER EXTREMITY Common Femoral Vein: No evidence of thrombus. Normal compressibility, respiratory phasicity and response to augmentation. Saphenofemoral Junction: No evidence of thrombus. Normal compressibility and flow on color Doppler imaging. Profunda Femoral Vein: No evidence of thrombus. Normal compressibility and flow on color Doppler imaging. Femoral Vein: No evidence of thrombus. Normal compressibility, respiratory phasicity and response to augmentation. Popliteal Vein: No evidence of thrombus. Normal compressibility, respiratory phasicity and response to augmentation. Calf Veins: No evidence of thrombus. Normal compressibility and flow on color Doppler imaging. Superficial Great Saphenous Vein: No evidence of thrombus. Normal compressibility and flow on color Doppler imaging. Venous Reflux:  None. Other Findings:  None. IMPRESSION: Extensive acute appearing deep venous thrombosis throughout the right lower extremity venous system as described above. No thrombus is seen in the left lower extremity venous system. Electronically Signed   By: Lowella Grip III M.D.   On: 02/11/2016 16:49    EKG: Independently reviewed. Normal sinus rhythm.  Assessment/Plan Principal Problem:   Bilateral pulmonary embolism (HCC) Active Problems:   Pulmonary embolism ([place])    1. Bilateral pulmonary embolism and right lower extremity DVT, unprovoked and hemodynamically stable - CT scan shows possible right heart strain, patient is  hemodynamic stable. Patient is started on heparin. Will cycle cardiac markers check 2-D echo. If continues to be stable change to oral anticoagulants. Cause of PE not clear. May need follow-up with hematologist. Patient has had colonoscopy in 2014 which only showed diverticulosis. 2. History of IBS on [place]. 3. History of GERD with hiatal hernia on PPI.   DVT prophylaxis: Heparin. Code Status: Full code.  Family Communication: Discussed with patient.  Disposition Plan: Home.  Consults called: ER physician had discussed with reviewed.  Admission status: Inpatient. Likely stay 2-3 days.    Rise Patience MD [facility] Pager [phone].  If 7PM-7AM, please contact night-coverage [email or web address] Password [email or web address]  02/11/2016, 11:22 PM

## 2016-02-11 NOTE — Addendum Note (Signed)
Addended by: Silverio Decamp on: 02/11/2016 12:54 PM   Modules accepted: Orders

## 2016-02-11 NOTE — ED Notes (Addendum)
Patient transported to CT.  [place] of lower ext will be done after the CT.  Pt aware.

## 2016-02-11 NOTE — ED Triage Notes (Addendum)
Patient went to see his PCP and was sent here for any outpatient CT - patient is possibly allergic to the dye - He has been SOB and was being sent to have a CT angio. Patient reports that he just doesn't feel well and hasn't for a year. Reports a generalize ache to his chest and whole body

## 2016-02-11 NOTE — ED Notes (Signed)
Unfractionated Heparin order for midnight mistakenly clicked off.  Pharmacist notified and he is going to re-enter the order.

## 2016-02-11 NOTE — Progress Notes (Signed)
ANTICOAGULATION CONSULT NOTE - Initial Consult  Pharmacy Consult for heparin Indication: pulmonary embolus  Allergies  Allergen Reactions  . Dexilant [Dexlansoprazole] Other (See Comments)    Muscle aches  . Soma [Carisoprodol]     Sores on arm  . Sulfamethoxazole     REACTION: unspecified  . Ivp Dye [Iodinated Diagnostic Agents] Rash    Patient Measurements: Height: 6' (182.9 cm) Weight: 170 lb (77.1 kg) IBW/kg (Calculated) : 77.6 Heparin Dosing Weight: 77 kg  Vital Signs: Temp: 97.6 F (36.4 C) (10/30 1315) Temp Source: Oral (10/30 1315) BP: 124/74 (10/30 1500) Pulse Rate: 88 (10/30 1500)  Labs:  Recent Labs  02/11/16 1351  HGB 15.0  HCT 43.4  PLT 169  CREATININE 0.83    Estimated Creatinine Clearance: 98.1 mL/min (by C-G formula based on SCr of 0.83 mg/dL). Stable clearance  Medical History: Past Medical History:  Diagnosis Date  . Anal fissure   . ANXIETY   . Arthritis   . Chronic constipation   . DEPRESSION   . DIVERTICULOSIS, COLON [phone]   Colonoscopy  . Fatty liver   . GERD   . H. pylori infection   . Hemorrhoids [id number]   Colonoscopy   . Hiatal hernia [id number]   EGD  . HYPERLIPIDEMIA   . IBS (irritable bowel syndrome)   . Internal hemorrhoids   . Peripheral neuropathy ([place])   . Personal history of colonic polyps 10/27/2007   HYPERPLASTIC POLYP  . RENAL CALCULUS   . TMJ (temporomandibular joint syndrome)   . Unspecified gastritis and gastroduodenitis without mention of hemorrhage 2009   EGD    Medications:   (Not in a hospital admission)  Assessment: Heparin for bilateral PE. Pt not on PTA anticoagulants. H/H, Plt WNL.   Goal of Therapy:  Heparin level 0.3-0.7 units/ml Monitor platelets by anticoagulation protocol: Yes   Plan:  Heparin 4000 units IV bolus x1 then 1300 units/hr infusion Heparin level @ 0000 Daily heparin level and CBC Monitor for s/sx bleeding  Cheral Almas, PharmD Candidate 02/11/2016,4:46  PM

## 2016-02-11 NOTE — ED Provider Notes (Signed)
[facility] DEPT [facility] Provider Note   [name]: [id number] Arrival date & time: 02/11/16  1308     History   Chief Complaint Chief Complaint  Patient presents with  . Shortness of Breath    HPI LOGIC HOOCK is a 65 y.o. male.  HPI   Patient sent in by PCP for CT angio chest.  Pt has not felt well throughout 2017.  Has had increased fatigue, daytime sleepiness, heavy breathing at night, pain all over.  Has had "tickling cough," and difficulty breathing when bending foreard.  Has seen his PCP multiple times for similar symptoms recently and labs were ordered including CBC, CMP, BNP, d-dimer.  Dimer was elevated.  Pt was to have outpatient CT angio but will need to be premedicated so was told to come here.  Reports he had a few pruritic lesions following a CT scan with IV dye performed in the 1980s.  No airway effects.   Does note he had a cold 2 weeks ago and also has had right calf pain since last week.      Past Medical History:  Diagnosis Date  . Anal fissure   . ANXIETY   . Arthritis   . Chronic constipation   . DEPRESSION   . DIVERTICULOSIS, COLON [phone]   Colonoscopy  . Fatty liver   . GERD   . H. pylori infection   . Hemorrhoids [id number]   Colonoscopy   . Hiatal hernia [id number]   EGD  . HYPERLIPIDEMIA   . IBS (irritable bowel syndrome)   . Internal hemorrhoids   . Peripheral neuropathy ([place])   . Personal history of colonic polyps 10/27/2007   HYPERPLASTIC POLYP  . RENAL CALCULUS   . TMJ (temporomandibular joint syndrome)   . Unspecified gastritis and gastroduodenitis without mention of hemorrhage 2009   EGD    Patient Active Problem List   Diagnosis Date Noted  . Bilateral pulmonary embolism ([place]) 02/11/2016  . Shortness of breath 02/08/2016  . Hearing loss due to cerumen impaction, right 01/19/2015  . Acute bronchitis 01/15/2015  . Nephrolithiasis 04/19/2014  . Peripheral neuropathy ([place]) 12/15/2013  . Thrombosed external hemorrhoid s/p  I&D [id number] 06/09/2012  . 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 IBS (irritable bowel syndrome) 03/09/2012  . Abdominal pain 05/19/2011  . 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 ABDOMINAL PAIN 11/23/2009  . GERD 10/26/2007  . DIVERTICULOSIS, COLON 10/25/2007  . RENAL CALCULUS 10/25/2007  . HEMORRHOIDS 11/24/2006  . Hyperlipidemia 11/23/2006  . ANXIETY 11/23/2006  . DEPRESSION 11/23/2006  . COLONIC POLYPS, HX OF 11/23/2006    Past Surgical History:  Procedure Laterality Date  . Eye lid surgery Bilateral 01/2014  . FOOT NEUROMA SURGERY Right   . HAMMER TOE SURGERY Left   . HEEL SPUR SURGERY Bilateral   . KNEE SURGERY Left   . LITHOTRIPSY    . PARTIAL KNEE ARTHROPLASTY Right   . PATELLA ARTHROPLASTY Right 07/17/2003  . SHOULDER SURGERY Bilateral 07/2005, 03/2011   left x 2       Home Medications    Prior to Admission medications   Medication Sig Start Date End Date Taking? Authorizing Provider  dicyclomine (BENTYL) 10 MG capsule Take 1 capsule (10 mg total) by mouth 4 (four) times daily -  before meals and at bedtime. 07/03/15   Sean Hommel, DO  ipratropium (ATROVENT) 0.06 % nasal spray Place 2 sprays into both nostrils every 4 (four) hours as needed for rhinitis. 01/22/16   Gregor Hams, MD  linaclotide (LINZESS) 290 MCG CAPS capsule Take 1 capsule (290 mcg total) by mouth daily before breakfast. 11/23/15   Sean Hommel, DO  LORazepam (ATIVAN) 1 MG tablet Take 1 mg by mouth every 8 (eight) hours as needed for anxiety.  04/25/10   Historical Provider, MD  naproxen (NAPROSYN) 500 MG tablet Take 1 tablet (500 mg total) by mouth 2 (two) times daily with a meal. 02/05/16 02/04/17  Emeterio Reeve, DO  omeprazole (PRILOSEC) 20 MG capsule Take 1 capsule (20 mg total) by mouth daily. 02/05/16   Emeterio Reeve, DO  predniSONE (DELTASONE) 50 MG tablet 1 tab PO at 13h, 7h, and 1h before MRI. Take with 50mg  of Benadryl 1h before MRI. 02/11/16   Silverio Decamp, MD  tiZANidine  (ZANAFLEX) 4 MG tablet Take 1 tablet (4 mg total) by mouth every 6 (six) hours as needed for muscle spasms. 02/05/16   Emeterio Reeve, DO  topiramate (TOPAMAX) 25 MG tablet Take 1 tablet (25 mg total) by mouth 2 (two) times daily. For three weeks to reduce headache. 11/23/15   Marcial Pacas, DO    Family History Family History  Problem Relation Age of Onset  . Heart disease Mother   . Stroke Mother   . Cirrhosis Father   . Heart disease Father   . Skin cancer Father   . Prostate cancer Maternal Grandfather   . Parkinson's disease Paternal Grandfather   . Heart disease Brother   . Colon cancer Neg Hx     Social History Social History  Substance Use Topics  . Smoking status: Never Smoker  . Smokeless tobacco: Never Used  . Alcohol use No     Allergies   Dexilant [dexlansoprazole]; Soma [carisoprodol]; Sulfamethoxazole; and Ivp dye [iodinated diagnostic agents]   Review of Systems Review of Systems  All other systems reviewed and are negative.    Physical Exam Updated Vital Signs BP 126/78 (BP Location: Left Arm)   Pulse 96   Temp 97.6 F (36.4 C) (Oral)   Resp 17   Ht 6' (1.829 m)   Wt 77.1 kg   SpO2 97%   BMI 23.06 kg/m   Physical Exam  Constitutional: He appears well-developed and well-nourished. No distress.  Appears fatigued   HENT:  Head: Normocephalic and atraumatic.  Eyes: Conjunctivae are normal.  Neck: Normal range of motion. Neck supple.  Cardiovascular: Normal rate and regular rhythm.   Pulmonary/Chest: Effort normal and breath sounds normal. No respiratory distress. He has no wheezes. He has no rales.  Abdominal: Soft. He exhibits no distension and no mass. There is no tenderness. There is no rebound and no guarding.  Musculoskeletal:  Right calf tenderness.  No noted edema bilaterally.    Neurological: He is alert. He exhibits normal muscle tone.  Skin: He is not diaphoretic.  Nursing note and vitals reviewed.    ED Treatments / Results    Labs (all labs ordered are listed, but only abnormal results are displayed) Labs Reviewed  BASIC METABOLIC PANEL - Abnormal; Notable for the following:       Result Value   Potassium 3.4 (*)    Glucose, Bld 134 (*)    Calcium 8.5 (*)    All other components within normal limits  CBC WITH DIFFERENTIAL/PLATELET  TROPONIN I  BRAIN NATRIURETIC PEPTIDE  HEPARIN LEVEL (UNFRACTIONATED)  CBC    EKG  EKG Interpretation  Date/Time:  Monday February 11 2016 14:25:37 EDT Ventricular Rate:  92 PR Interval:    QRS  Duration: 94 QT Interval:  352 QTC Calculation: 436 R Axis:   31 Text Interpretation:  Sinus rhythm Low voltage, precordial leads Baseline wander in lead(s) V2 no STEMI. NO SIG CHANGE FROM OLD Confirmed by Johnney Killian, MD, Jeannie Done [phone]) on 02/11/2016 4:11:37 PM       Radiology Ct Angio Chest Pe W/cm &/or Wo Cm  Result Date: 02/11/2016 CLINICAL DATA:  Elevated D-dimer right calf pain and shortness of breath EXAM: CT ANGIOGRAPHY CHEST WITH CONTRAST TECHNIQUE: Multidetector CT imaging of the chest was performed using the standard protocol during bolus administration of intravenous contrast. Multiplanar CT image reconstructions and MIPs were obtained to evaluate the vascular anatomy. CONTRAST:  100 cc Isovue 370 intravenous COMPARISON:  Chest x-ray 01/29/2016 FINDINGS: Cardiovascular: Large filling defect present within the distal main right pulmonary artery consistent with pulmonary embolus. There is extensive from breasts also present within the right upper lobe pulmonary artery with extension of clot into segmental and subsegmental branches of the right upper lobe. There is additional thrombus within the right middle lobe pulmonary artery, proximal. Additional thrombus present within right lower lobe pulmonary artery and extending into segmental and subsegmental branches. There is additional filling defect within the left lower lobe pulmonary artery with extension of clot into segmental  and subsegmental left lower lobe pulmonary arteries. Calculated RV LV ratio is 1.2 suggesting right heart strain. Mild ectasia of the ascending aorta. No aneurysm or dissection. Mediastinum/Nodes: No significant mediastinal or hilar adenopathy. No axillary adenopathy. Trachea and mainstem bronchi appear normal. The esophagus is within normal limits.Thyroid gland unremarkable. Lungs/Pleura: Lungs demonstrate patchy peripheral right upper lobe pulmonary opacities move which may represent infiltrates or small infarcts. Linear scarring in the apical segment left upper lobe. Patchy dependent atelectasis. No effusions. Within the anterior aspect of the right middle lobe, there is a 4 mm pulmonary nodule, series 5, image number 52. Upper Abdomen: Gallbladder appears contracted. No acute abnormality in the upper abdomen. Musculoskeletal: Mild superior endplate compression of T8. Review of the MIP images confirms the above findings. IMPRESSION: 1. Extensive acute pulmonary embolus within the distal right main pulmonary artery with extension into right upper lobe, right middle lobe and right lower lobe pulmonary arteries. Additional acute embolus present within left lower lobe pulmonary artery and segmental and subsegmental branches. Positive for acute PE with CT evidence of right heart strain (RV/LV Ratio = 1.2) consistent with at least submassive (intermediate risk) PE. The presence of right heart strain has been associated with an increased risk of morbidity and mortality. Please activate Code PE by paging [phone]. 2. Peripheral ground-glass densities in the right upper lobe suspected to represent pulmonary infarcts. Infiltrates are also a possibility. 3. 4 mm lung nodule in the right middle lobe. No follow-up needed if patient is low-risk. Non-contrast chest CT can be considered in 12 months if patient is high-risk. This recommendation follows the consensus statement: Guidelines for Management of Incidental Pulmonary  Nodules Detected on CT Images: From the Fleischner Society 2017; Radiology 2017; 284:228-243. Critical Value/emergent results were called by telephone at the time of interpretation on 02/11/2016 at 4:17 pm to Dr. Clayton Bibles , who verbally acknowledged these results. Electronically Signed   By: Donavan Foil M.D.   On: 02/11/2016 16:18   US Venous Img Lower Bilateral  Result Date: 02/11/2016 CLINICAL DATA:  Pulmonary embolus demonstrated earlier in the days. Lower extremity pain EXAM: BILATERAL LOWER EXTREMITY VENOUS DUPLEX ULTRASOUND TECHNIQUE: Gray-scale sonography with graded compression, as well as color Doppler and duplex  ultrasound were performed to evaluate the lower extremity deep venous systems from the level of the common femoral vein and including the common femoral, femoral, profunda femoral, popliteal and calf veins including the posterior tibial, peroneal and gastrocnemius veins when visible. The superficial great saphenous vein was also interrogated. Spectral Doppler was utilized to evaluate flow at rest and with distal augmentation maneuvers in the common femoral, femoral and popliteal veins. COMPARISON:  None. FINDINGS: RIGHT LOWER EXTREMITY Common Femoral Vein: There is thrombus throughout the right common femoral vein which appears acute. There is only limited Doppler signal in this vessel and no compression or augmentation. Saphenofemoral Junction: No evidence of thrombus. Normal compressibility and flow on color Doppler imaging. Profunda Femoral Vein: There is acute appearing thrombus which appears occlusive. Absent Doppler signal. No compression or augmentation. Femoral Vein: There is acute appearing thrombus which appears occlusive. Absent Doppler signal. No compression or augmentation. Popliteal Vein: There is acute appearing thrombus which appears occlusive. Absent Doppler signal. No compression or augmentation. Calf Veins: Thrombus is seen in the proximal posterior tibial vein. The  peroneal vein appears normal. Superficial Great Saphenous Vein: No evidence of thrombus. Normal compressibility and flow on color Doppler imaging. Venous Reflux:  None. Other Findings:  None. LEFT LOWER EXTREMITY Common Femoral Vein: No evidence of thrombus. Normal compressibility, respiratory phasicity and response to augmentation. Saphenofemoral Junction: No evidence of thrombus. Normal compressibility and flow on color Doppler imaging. Profunda Femoral Vein: No evidence of thrombus. Normal compressibility and flow on color Doppler imaging. Femoral Vein: No evidence of thrombus. Normal compressibility, respiratory phasicity and response to augmentation. Popliteal Vein: No evidence of thrombus. Normal compressibility, respiratory phasicity and response to augmentation. Calf Veins: No evidence of thrombus. Normal compressibility and flow on color Doppler imaging. Superficial Great Saphenous Vein: No evidence of thrombus. Normal compressibility and flow on color Doppler imaging. Venous Reflux:  None. Other Findings:  None. IMPRESSION: Extensive acute appearing deep venous thrombosis throughout the right lower extremity venous system as described above. No thrombus is seen in the left lower extremity venous system. Electronically Signed   By: Lowella Grip III M.D.   On: 02/11/2016 16:49    Procedures Procedures (including critical care time)  Medications Ordered in ED Medications  heparin ADULT infusion 100 units/mL (25000 units/251mL sodium chloride 0.45%) (1,300 Units/hr Intravenous New Bag/Given 02/11/16 1657)  methylPREDNISolone sodium succinate (SOLU-MEDROL) 125 mg/2 mL injection 125 mg (125 mg Intravenous Given 02/11/16 1428)  diphenhydrAMINE (BENADRYL) injection 25 mg (25 mg Intravenous Given 02/11/16 1427)  iopamidol (ISOVUE-370) 76 % injection 100 mL (100 mLs Intravenous Contrast Given 02/11/16 1525)  heparin bolus via infusion 4,000 Units (4,000 Units Intravenous Bolus from Bag 02/11/16  1658)     Initial Impression / Assessment and Plan / ED Course  I have reviewed the triage vital signs and the nursing notes.  Pertinent labs & imaging results that were available during my care of the patient were reviewed by me and considered in my medical decision making (see chart for details).  Clinical Course  Comment By Time  Received call from radiology regarding CT results.  Have paged Code PE.   Clayton Bibles, PA-C 10/30 1618    Afebrile nontoxic patient with hx gradually worsening fatigue x 1 year, increased SOB and cough x 1 month.  Sent by PCP after initial testing resulted in elevated d-dimer.  Labs otherwise unremarkable.  CT angio chest demonstrates extensive clot on right with some e/o infarct, also clot on left, evidence of  right heart strain (ratio 1.2).  Pt requiring no oxygen.  Also seen by Dr Johnney Killian who explained diagnosis and plan to him. Discussed pt with Critical Care MD who recommends hospitalist admission. Admitted to Dr Marily Memos, [facility].  Final Clinical Impressions(s) / ED Diagnoses   Final diagnoses:  Bilateral pulmonary embolism ([place])  Pulmonary infarct [facility])    New Prescriptions New Prescriptions   No medications on file     [place], PA-C 02/11/16 1731    Charlesetta Shanks, MD 02/12/16 1022

## 2016-02-11 NOTE — ED Notes (Signed)
MD at bedside discussing test results and dispo plan of care. 

## 2016-02-11 NOTE — ED Notes (Signed)
CT notified pt has been premedicated for his scan

## 2016-02-11 NOTE — Progress Notes (Signed)
Patient coming from [facility] for treatment of bilateral submassive pulmonary embolus with right heart strain. Critical care medicine consult on patient who recommends admission to medicine service. Critical care may be consult after patient arrives if their services are needed. EDP has some concern for possible malignancy as underlying etiology for clots given her chronic complaints of generalized fatigue and unintentional weight loss. Patient accepted to stepdown unit for full inpatient admission.  Linna Darner, MD [facility] 02/11/2016, 5:26 PM

## 2016-02-12 ENCOUNTER — Other Ambulatory Visit: Payer: Medicare Other

## 2016-02-12 ENCOUNTER — Inpatient Hospital Stay (HOSPITAL_COMMUNITY): Payer: Medicare Other

## 2016-02-12 ENCOUNTER — Ambulatory Visit: Payer: Medicare Other | Admitting: Sports Medicine

## 2016-02-12 DIAGNOSIS — I2699 Other pulmonary embolism without acute cor pulmonale: Secondary | ICD-10-CM

## 2016-02-12 HISTORY — PX: TRANSTHORACIC ECHOCARDIOGRAM: SHX275

## 2016-02-12 LAB — HEPARIN LEVEL (UNFRACTIONATED)
HEPARIN UNFRACTIONATED: 0.71 [IU]/mL — AB (ref 0.30–0.70)
Heparin Unfractionated: 0.67 IU/mL (ref 0.30–0.70)

## 2016-02-12 LAB — TROPONIN I
Troponin I: <0.03 ng/mL (ref <0.03)
Troponin I: <0.03 ng/mL (ref <0.03)
Troponin I: <0.03 ng/mL (ref <0.03)

## 2016-02-12 LAB — ECHOCARDIOGRAM COMPLETE
AOASC: 33 cm
CHL CUP MV DEC (S): 232
E/e' ratio: 8.16
EWDT: 232 ms
FS: 36 % (ref 28–44)
Height: 72 in
IVS/LV PW RATIO, ED: 1.86
LA ID, A-P, ES: 31 mm
LADIAMINDEX: 1.57 cm/m2
LAVOL: 50.5 mL
LAVOLA4C: 51.3 mL
LAVOLIN: 25.5 mL/m2
LEFT ATRIUM END SYS DIAM: 31 mm
LV PW d: 10.3 mm — AB (ref 0.6–1.1)
LV e' LATERAL: 8.38 cm/s
LVEEAVG: 8.16
LVEEMED: 8.16
LVOT SV: 62 mL
LVOT VTI: 22 cm
LVOT area: 2.84 cm2
LVOT diameter: 19 mm
LVOT peak vel: 105 cm/s
MVPKAVEL: 79.1 m/s
MVPKEVEL: 68.4 m/s
TAPSE: 22.8 mm
TDI e' lateral: 8.38
TDI e' medial: 8.27
Weight: 2719.59 oz

## 2016-02-12 LAB — CBC WITH DIFFERENTIAL/PLATELET
BASOS ABS: 0 10*3/uL (ref 0.0–0.1)
BASOS PCT: 0 %
Eosinophils Absolute: 0 10*3/uL (ref 0.0–0.7)
Eosinophils Relative: 0 %
HEMATOCRIT: 41.3 % (ref 39.0–52.0)
HEMOGLOBIN: 14.4 g/dL (ref 13.0–17.0)
Lymphocytes Relative: 9 %
Lymphs Abs: 0.9 10*3/uL (ref 0.7–4.0)
MCH: 30.4 pg (ref 26.0–34.0)
MCHC: 34.9 g/dL (ref 30.0–36.0)
MCV: 87.1 fL (ref 78.0–100.0)
MONOS PCT: 3 %
Monocytes Absolute: 0.3 10*3/uL (ref 0.1–1.0)
NEUTROS ABS: 8.4 10*3/uL — AB (ref 1.7–7.7)
NEUTROS PCT: 88 %
Platelets: 179 10*3/uL (ref 150–400)
RBC: 4.74 MIL/uL (ref 4.22–5.81)
RDW: 13.1 % (ref 11.5–15.5)
WBC: 9.5 10*3/uL (ref 4.0–10.5)

## 2016-02-12 LAB — COMPREHENSIVE METABOLIC PANEL
ALK PHOS: 80 U/L (ref 38–126)
ALT: 17 U/L (ref 17–63)
ANION GAP: 8 (ref 5–15)
AST: 22 U/L (ref 15–41)
Albumin: 3 g/dL — ABNORMAL LOW (ref 3.5–5.0)
BUN: 14 mg/dL (ref 6–20)
CALCIUM: 9.1 mg/dL (ref 8.9–10.3)
CO2: 24 mmol/L (ref 22–32)
Chloride: 108 mmol/L (ref 101–111)
Creatinine, Ser: 0.86 mg/dL (ref 0.61–1.24)
GFR calc non Af Amer: >60 mL/min (ref >60)
Glucose, Bld: 188 mg/dL — ABNORMAL HIGH (ref 65–99)
Potassium: 4.4 mmol/L (ref 3.5–5.1)
SODIUM: 140 mmol/L (ref 135–145)
TOTAL PROTEIN: 6 g/dL — AB (ref 6.5–8.1)
Total Bilirubin: 0.7 mg/dL (ref 0.3–1.2)

## 2016-02-12 NOTE — Progress Notes (Signed)
David Armstrong [id number] DOB: May 27, 1950 DOA: 02/11/2016 PCP: Emeterio Reeve, DO  Brief narrative:  65 y/o ? Prior pruritis Recent Rx PNA Possible hepatic steatosis based on [place] abd 06/2015 GERD IBScolonoscopy 12/2010 showed diverticulosis prio knee repair  Admitted with SOB and pain in chest and found to have large PE  Past medical history-As per Problem list Chart reviewed as below-   Consultants:    Procedures:    Antibiotics:     Subjective   Alert pleasant anxious in nad Fair No current cp   Objective     Objective: Vitals:   02/12/16 0318 02/12/16 0400 02/12/16 0600 02/12/16 0716  BP: 118/74 110/77 114/66 (!) 127/42  Pulse: 81 81  95  Resp: 14 19 16  (!) 29  Temp: 97.5 F (36.4 C)   97.7 F (36.5 C)  TempSrc: Oral   Oral  SpO2: 99% 93% 94% 97%  Weight: 77.1 kg (169 lb 15.6 oz)     Height:        Intake/Output Summary (Last 24 hours) at 02/12/16 0735 Last data filed at 02/12/16 0600  Gross per 24 hour  Intake           897.15 ml  Output              300 ml  Net           597.15 ml    Exam:  General: eomi ncat Cardiovascular: s1 s2 no m/r/g Respiratory:  Clear no adde dsoudn Abdomen:  Soft nt nd  Skin no le edema Neuro intact  Data Reviewed: Basic Metabolic Panel:  Recent Labs Lab 02/08/16 1400 02/11/16 1351 02/12/16 0519  NA 141 139 140  K 4.1 3.4* 4.4  CL 107 107 108  CO2 26 25 24   GLUCOSE 76 134* 188*  BUN 16 15 14   CREATININE 0.84 0.83 0.86  CALCIUM 8.7 8.5* 9.1   Liver Function Tests:  Recent Labs Lab 02/08/16 1400 02/12/16 0519  AST 18 22  ALT 15 17  ALKPHOS 96 80  BILITOT 0.6 0.7  PROT 6.5 6.0*  ALBUMIN 3.8 3.0*   No results for input(s): LIPASE, AMYLASE in the last 168 hours. No results for input(s): AMMONIA in the last 168 hours. CBC:  Recent Labs Lab 02/08/16 1400 02/11/16 1351 02/12/16 0519  WBC 7.0 6.6 9.5  NEUTROABS  --  4.5 8.4*  HGB 14.5 15.0 14.4  HCT 42.8 43.4 41.3  MCV  90.5 88.6 87.1  PLT 150 169 179   Cardiac Enzymes:  Recent Labs Lab 02/11/16 1630 02/11/16 2326 02/12/16 0519  TROPONINI <0.03 <0.03 <0.03   BNP: Invalid input(s): POCBNP CBG: No results for input(s): GLUCAP in the last 168 hours.  Recent Results (from the past 240 hour(s))  MRSA PCR Screening     Status: None   Collection Time: 02/11/16  9:34 PM  Result Value Ref Range Status   MRSA by PCR NEGATIVE NEGATIVE Final    Comment:        The GeneXpert MRSA Assay (FDA approved for NASAL specimens only), is one component of a comprehensive MRSA colonization surveillance program. It is not intended to diagnose MRSA infection nor to guide or monitor treatment for MRSA infections.      Studies:              All Imaging reviewed and is as per above notation   Scheduled Meds: . dicyclomine  10 mg Oral TID AC & HS  . pantoprazole  40 mg Oral Daily   Continuous Infusions: . sodium chloride 75 mL/hr at 02/12/16 0600  . heparin 1,300 Units/hr (02/12/16 0600)     Assessment/Plan:  Pulm embolism R Pulm Art/R mid + Lower lob & LLL Pulm artery-Intermediate risk PE with R heart strain-Needs at least 48-72 hrs IV heparin with probable transition to NOAC as OP.  He had a bunion surgery earlier this yea ron same leg and was relatively immobile, doesn't smoke, take androgen so this is unprovoked PE.  need probable 9 mo-12 mo AC and then decision regarding AC as well as possible work-up for causes like thrombophilia's a time of consideration of interruption IBS-cont linzess GERD-Cont Pantoprazole  20 min    Verneita Griffes, MD  [facility] Pager [phone] 02/12/2016, 7:35 AM    LOS: 1 day

## 2016-02-12 NOTE — Progress Notes (Signed)
Pt copay will be $98.56- prior auth not required  For Eliquis 5mg 

## 2016-02-12 NOTE — Care Management Note (Signed)
Case Management Note  Patient Details  Name: David Armstrong MRN: [id number] Date of Birth: 06-08-1950  Subjective/Objective:   Presents with bil pe's and right le dvt, with possible right heart strain, if conts to be stable per MD note will change to oral anticoagulants.  NCM will cont to follow for dc needs.                 Action/Plan:   Expected Discharge Date:                  Expected Discharge Plan:  Home/Self Care  In-House Referral:     Discharge planning Services  CM Consult  Post Acute Care Choice:    Choice offered to:     DME Arranged:    DME Agency:     HH Arranged:    HH Agency:     Status of Service:  In process, will continue to follow  If discussed at Long Length of Stay Meetings, dates discussed:    Additional Comments:  Zenon Mayo, RN 02/12/2016, 9:04 AM

## 2016-02-12 NOTE — Progress Notes (Signed)
[place] for heparin Indication: pulmonary embolus  Allergies  Allergen Reactions  . Dexilant [Dexlansoprazole] Other (See Comments)    Muscle aches  . Ivp Dye [Iodinated Diagnostic Agents] Rash  . Soma [Carisoprodol] Other (See Comments)    Sores on arm  . Sulfamethoxazole Other (See Comments)    REACTION: unspecified    Patient Measurements: Height: 6' (182.9 cm) Weight: 169 lb 15.6 oz (77.1 kg) IBW/kg (Calculated) : 77.6 Heparin Dosing Weight: 77 kg  Vital Signs: Temp: 97 F (36.1 C) (10/31 1100) Temp Source: Oral (10/31 1100) BP: 119/78 (10/31 1100) Pulse Rate: 98 (10/31 1100)  Labs:  Recent Labs  02/11/16 1351  02/11/16 2326 02/12/16 0519 02/12/16 1108  HGB 15.0  --   --  14.4  --   HCT 43.4  --   --  41.3  --   PLT 169  --   --  179  --   HEPARINUNFRC  --   --   --  0.67 0.71*  CREATININE 0.83  --   --  0.86  --   TROPONINI  --   < > <0.03 <0.03 <0.03  < > = values in this interval not displayed.  Estimated Creatinine Clearance: 94.6 mL/min (by C-G formula based on SCr of 0.86 mg/dL).  Assessment: 65 y.o. male with PE on heparin and heparin level is slightly above goal.   Goal of Therapy:  Heparin level 0.3-0.7 units/ml Monitor platelets by anticoagulation protocol: Yes   Plan:  -Decrease heparin to 1250 units/hr -Daily heparin level and CBC -Will follow oral anticoagulation plans  Hildred Laser, Pharm D 02/12/2016 12:54 PM

## 2016-02-12 NOTE — Progress Notes (Signed)
Assisted patient with ambulation, with steady gait, tolerated 100 ft, on room air , O2 sat 94-95 %  , denies SOB nor any discomforts. HR 90'S  RR 18-21

## 2016-02-12 NOTE — Progress Notes (Signed)
  Echocardiogram 2D Echocardiogram has been performed.  Bobbye Charleston 02/12/2016, 1:41 PM

## 2016-02-12 NOTE — Progress Notes (Signed)
[place] for heparin Indication: pulmonary embolus  Allergies  Allergen Reactions  . Dexilant [Dexlansoprazole] Other (See Comments)    Muscle aches  . Soma [Carisoprodol]     Sores on arm  . Sulfamethoxazole     REACTION: unspecified  . Ivp Dye [Iodinated Diagnostic Agents] Rash    Patient Measurements: Height: 6' (182.9 cm) Weight: 169 lb 15.6 oz (77.1 kg) IBW/kg (Calculated) : 77.6 Heparin Dosing Weight: 77 kg  Vital Signs: Temp: 97.5 F (36.4 C) (10/31 0318) Temp Source: Oral (10/31 0318) BP: 118/74 (10/31 0318) Pulse Rate: 81 (10/31 0318)  Labs:  Recent Labs  02/11/16 1351 02/11/16 1630 02/11/16 2326 02/12/16 0519  HGB 15.0  --   --  14.4  HCT 43.4  --   --  41.3  PLT 169  --   --  179  HEPARINUNFRC  --   --   --  0.67  CREATININE 0.83  --   --   --   TROPONINI  --  <0.03 <0.03  --     Estimated Creatinine Clearance: 98.1 mL/min (by C-G formula based on SCr of 0.83 mg/dL).  Assessment: 65 y.o. male with PE for heparin  Goal of Therapy:  Heparin level 0.3-0.7 units/ml Monitor platelets by anticoagulation protocol: Yes   Plan:  Continue Heparin at current rate  Recheck level later this morning to verify  Phillis Knack, PharmD, BCPS  02/12/2016,6:13 AM

## 2016-02-12 NOTE — Consult Note (Signed)
           [facility] CM Primary Care Navigator  02/12/2016  David Armstrong [birth year] [id number]    Patient seen at the bedside to identify possible discharge needs.  Patient shared that aching pain to back of right leg (calf) and labored breathing had led to this admission.   Discharge plan per patient is to go home.  Patient confirms that primary care provider is Dr.Natalie Sheppard Coil, DO with [facility] at [facility]. Patient is able to drive prior to admission but will have a friend David Armstrong) or great nephew David Armstrong) whom he can talk to provide transportation to his doctors' appointments as needed. According to patient, his sister has a broken right wrist and sister's daughter broke her left wrist from an accident.   Patient states using [facility] to obtain medications with no problem. Patient is independent  with medication management at home taking straight out of the containers.  He will be the primary caregiver for himself at home and has his family (sister, niece, great nephew and cousins) who can assist with care if needed per patient.   Patient had expressed understanding to call primary care provider's office once discharged, for a post discharge follow-up appointment within a week or sooner if needed.  Patient letter provided as a reminder.  Patient denies any needs or concerns at this time.   For questions, please contact:  Dannielle Huh, BSN, RN- [facility] Primary Care Navigator  Telephone: [phone] [place]

## 2016-02-13 DIAGNOSIS — F411 Generalized anxiety disorder: Secondary | ICD-10-CM

## 2016-02-13 DIAGNOSIS — I824Y1 Acute embolism and thrombosis of unspecified deep veins of right proximal lower extremity: Secondary | ICD-10-CM | POA: Diagnosis present

## 2016-02-13 DIAGNOSIS — I2699 Other pulmonary embolism without acute cor pulmonale: Principal | ICD-10-CM

## 2016-02-13 DIAGNOSIS — I824Z1 Acute embolism and thrombosis of unspecified deep veins of right distal lower extremity: Secondary | ICD-10-CM | POA: Diagnosis present

## 2016-02-13 LAB — CBC WITH DIFFERENTIAL/PLATELET
BASOS ABS: 0 10*3/uL (ref 0.0–0.1)
Basophils Relative: 0 %
EOS PCT: 0 %
Eosinophils Absolute: 0 10*3/uL (ref 0.0–0.7)
HEMATOCRIT: 38.7 % — AB (ref 39.0–52.0)
HEMOGLOBIN: 13.2 g/dL (ref 13.0–17.0)
LYMPHS PCT: 21 %
Lymphs Abs: 2 10*3/uL (ref 0.7–4.0)
MCH: 30.6 pg (ref 26.0–34.0)
MCHC: 34.1 g/dL (ref 30.0–36.0)
MCV: 89.6 fL (ref 78.0–100.0)
Monocytes Absolute: 0.7 10*3/uL (ref 0.1–1.0)
Monocytes Relative: 7 %
NEUTROS ABS: 6.8 10*3/uL (ref 1.7–7.7)
NEUTROS PCT: 72 %
PLATELETS: 151 10*3/uL (ref 150–400)
RBC: 4.32 MIL/uL (ref 4.22–5.81)
RDW: 13.7 % (ref 11.5–15.5)
WBC: 9.4 10*3/uL (ref 4.0–10.5)

## 2016-02-13 LAB — HEPARIN LEVEL (UNFRACTIONATED): HEPARIN UNFRACTIONATED: 0.6 [IU]/mL (ref 0.30–0.70)

## 2016-02-13 MED ORDER — RIVAROXABAN 15 MG PO TABS: 15.0000 mg | ORAL_TABLET | Freq: Two times a day (BID) | ORAL | Status: DC

## 2016-02-13 MED ORDER — RIVAROXABAN 15 MG PO TABS
15.0000 mg | ORAL_TABLET | Freq: Two times a day (BID) | ORAL | Status: DC
  Administered 2016-02-13 – 2016-02-14 (×2): 15 mg via ORAL
  Filled 2016-02-13 (×2): qty 1

## 2016-02-13 MED ORDER — RIVAROXABAN 20 MG PO TABS: 20.0000 mg | ORAL_TABLET | Freq: Every day | ORAL | Status: DC

## 2016-02-13 NOTE — Care Management Note (Signed)
Case Management Note  Patient Details  Name: CLAIR DIAB MRN: [id number] Date of Birth: Jul 21, 1950  Subjective/Objective:  Presents with  bil pe's and right le dvt , with possible right heart strain, NCM gave patient 30 day savings card for eliquis. Per benefit check:   Pt copay will be $98.56- prior auth not required  For Eliquis 5mg , , NCM informed patient if the co pay is too much for him to let his Cardiologist know and they will change him to somethin else during his follow up apt.                Action/Plan:   Expected Discharge Date:                  Expected Discharge Plan:  Home/Self Care  In-House Referral:     Discharge planning Services  CM Consult  Post Acute Care Choice:    Choice offered to:     DME Arranged:    DME Agency:     HH Arranged:    HH Agency:     Status of Service:  Completed, signed off  If discussed at [employer] of Stay Meetings, dates discussed:    Additional Comments:  Zenon Mayo, RN 02/13/2016, 8:57 AM

## 2016-02-13 NOTE — Care Management Note (Addendum)
Case Management Note  Patient Details  Name: David Armstrong MRN: [id number] Date of Birth: June 20, 1950  Subjective/Objective:  NCM spoke with patient while speaking with Qinoka from [place] on the phone, she stated patient will need to come to [place] when he is discharged ,he will need to bring his va card and his papers from this admit, with him.  He will need to be seen to update his information in the va system with new address etc and they will be able to to make him an apt with a MD and he will be able to get his medications there free for refills.  This information was given to patient and to attending MD.  Patient states he will go to  [facility] at discharge., MD states he will speak with pharmacy to see which med they want patient to be on.  Patient has 30 day savings card for eliquis already.   11/2- Looks like pharmacy is recommending xarelto, NCM gave patient the 30 day savings card for xarelto. Patient goes to [facility] on [address].  NCM called to see if he has in stock, he has a few but can order the rest when gets script.  Script needs to specify xrarelto starter kit.  Patient will need security to take him to pick up his car at [facility].                    Action/Plan:   Expected Discharge Date:                  Expected Discharge Plan:  Home/Self Care  In-House Referral:     Discharge planning Services  CM Consult  Post Acute Care Choice:    Choice offered to:     DME Arranged:    DME Agency:     HH Arranged:    HH Agency:     Status of Service:  Completed, signed off  If discussed at [employer] of Stay Meetings, dates discussed:    Additional Comments:  Zenon Mayo, RN 02/13/2016, 4:33 PM

## 2016-02-13 NOTE — Progress Notes (Signed)
XARELTO- 15 MG DAILY  COVER- YES  CO-PAY- $ 97.89    ( 90 DAY SUPPLY OR M/O- $ 60.00 )  PRIOR APPROVAL- NO   XARELTO 20 MG DAILY  COVER- YES  CO-PAY- $ 97.89   ( 90 DAY SUPPLY OR M/O- $60.00 )  PRIOR APPROVAL - NO

## 2016-02-13 NOTE — Progress Notes (Signed)
PROGRESS NOTE    David Armstrong  [id number] DOB: 03/04/1951 DOA: 02/11/2016 PCP: Emeterio Reeve, DO    Brief Narrative:  Patient is a 65 year old gentleman presented to the ED with right calf pain and shortness of breath 2 weeks. CT angiogram chest consistent with bilateral PE. Lower extremity Dopplers with acute DVT extensive in the right venous system. Patient on IV heparin.   Assessment & Plan:   Principal Problem:   Bilateral pulmonary embolism (HCC) Active Problems:   Anxiety state   Peripheral neuropathy (HCC)   Pulmonary embolism (HCC)   DVT, lower extremity, proximal, acute, right (HCC)   DVT, lower extremity, distal, acute, right ([place])   #1 acute bilateral PE/extensive acute DVT throughout right lower extremity venous system Patient with clinical improvement. Per CT angiogram chest and per bilateral lower extremity Dopplers. 2-D echo with a EF of 65-70% with no wall motion abnormalities. Right systolic function was normal. Right lower extremity with no edema, no erythema, no significant tenderness to palpation. Patient denies any shortness of breath. No chest pain. Patient on IV heparin. Will check a anti-cardolipin antibody, anti-beta glycoprotein antibody. As this is likely an unprovoked, DVT patient will likely need to be on prolonged anticoagulation. Will likely transition to oral Xarelto or eliquis pending insurance assessment. Will need to follow-up with PCP the outpatient setting.  #2 gastroesophageal reflux disease PPI.  #3 general anxiety state Ativan as needed.   DVT prophylaxis: IV heparin Code Status: Full Family Communication: Updated patient. No family present. Disposition Plan: Home once on oral anticoagulation and hopefully in the next 24-48 hours.   Consultants:   [facility] sided hematology  Procedures:  CT angiogram chest 02/11/2016  Lower extremity Dopplers 02/11/2016  2-D echo 02/12/2016  Antimicrobials:    None   Subjective: Patient denies any SOB. No CP.  Objective: Vitals:   02/12/16 2024 02/12/16 2300 02/13/16 0600 02/13/16 0746  BP: 121/66  128/82 131/80  Pulse: 90 81 70 84  Resp: 16 19 14 20   Temp: 98.2 F (36.8 C)  97.8 F (36.6 C) 97.7 F (36.5 C)  TempSrc: Oral  Oral Oral  SpO2: 97% 96% 98% 98%  Weight:   77.3 kg (170 lb 6.7 oz)   Height:        Intake/Output Summary (Last 24 hours) at 02/13/16 1114 Last data filed at 02/13/16 0900  Gross per 24 hour  Intake           2058.5 ml  Output             1425 ml  Net            633.5 ml   Filed Weights   02/11/16 2047 02/12/16 0318 02/13/16 0600  Weight: 77.1 kg (169 lb 15.6 oz) 77.1 kg (169 lb 15.6 oz) 77.3 kg (170 lb 6.7 oz)    Examination:  General exam: Appears calm and comfortable  Respiratory system: Clear to auscultation. Respiratory effort normal. Cardiovascular system: S1 & S2 heard, RRR. No JVD, murmurs, rubs, gallops or clicks. No pedal edema. Gastrointestinal system: Abdomen is nondistended, soft and nontender. No organomegaly or masses felt. Normal bowel sounds heard. Central nervous system: Alert and oriented. No focal neurological deficits. Extremities: Symmetric 5 x 5 power. Skin: No rashes, lesions or ulcers Psychiatry: Judgement and insight appear normal. Mood & affect appropriate.     Data Reviewed: I have personally reviewed following labs and imaging studies  CBC:  Recent Labs Lab 02/08/16 1400 02/11/16 1351 02/12/16 0519  02/13/16 0418  WBC 7.0 6.6 9.5 9.4  NEUTROABS  --  4.5 8.4* 6.8  HGB 14.5 15.0 14.4 13.2  HCT 42.8 43.4 41.3 38.7*  MCV 90.5 88.6 87.1 89.6  PLT 150 169 179 [id number]   Basic Metabolic Panel:  Recent Labs Lab 02/08/16 1400 02/11/16 1351 02/12/16 0519  NA 141 139 140  K 4.1 3.4* 4.4  CL 107 107 108  CO2 26 25 24   GLUCOSE 76 134* 188*  BUN 16 15 14   CREATININE 0.84 0.83 0.86  CALCIUM 8.7 8.5* 9.1   GFR: Estimated Creatinine Clearance: 94.9 mL/min (by  C-G formula based on SCr of 0.86 mg/dL). Liver Function Tests:  Recent Labs Lab 02/08/16 1400 02/12/16 0519  AST 18 22  ALT 15 17  ALKPHOS 96 80  BILITOT 0.6 0.7  PROT 6.5 6.0*  ALBUMIN 3.8 3.0*   No results for input(s): LIPASE, AMYLASE in the last 168 hours. No results for input(s): AMMONIA in the last 168 hours. Coagulation Profile: No results for input(s): INR, PROTIME in the last 168 hours. Cardiac Enzymes:  Recent Labs Lab 02/11/16 1630 02/11/16 2326 02/12/16 0519 02/12/16 1108  TROPONINI <0.03 <0.03 <0.03 <0.03   BNP (last 3 results) No results for input(s): PROBNP in the last 8760 hours. HbA1C: No results for input(s): HGBA1C in the last 72 hours. CBG: No results for input(s): GLUCAP in the last 168 hours. Lipid Profile: No results for input(s): CHOL, HDL, LDLCALC, TRIG, CHOLHDL, LDLDIRECT in the last 72 hours. Thyroid Function Tests: No results for input(s): TSH, T4TOTAL, FREET4, T3FREE, THYROIDAB in the last 72 hours. Anemia Panel: No results for input(s): VITAMINB12, FOLATE, FERRITIN, TIBC, IRON, RETICCTPCT in the last 72 hours. Sepsis Labs: No results for input(s): PROCALCITON, LATICACIDVEN in the last 168 hours.  Recent Results (from the past 240 hour(s))  MRSA PCR Screening     Status: None   Collection Time: 02/11/16  9:34 PM  Result Value Ref Range Status   MRSA by PCR NEGATIVE NEGATIVE Final    Comment:        The GeneXpert MRSA Assay (FDA approved for NASAL specimens only), is one component of a comprehensive MRSA colonization surveillance program. It is not intended to diagnose MRSA infection nor to guide or monitor treatment for MRSA infections.          Radiology Studies: Ct Angio Chest Pe W/cm &/or Wo Cm  Result Date: 02/11/2016 CLINICAL DATA:  Elevated D-dimer right calf pain and shortness of breath EXAM: CT ANGIOGRAPHY CHEST WITH CONTRAST TECHNIQUE: Multidetector CT imaging of the chest was performed using the standard  protocol during bolus administration of intravenous contrast. Multiplanar CT image reconstructions and MIPs were obtained to evaluate the vascular anatomy. CONTRAST:  100 cc Isovue 370 intravenous COMPARISON:  Chest x-ray 01/29/2016 FINDINGS: Cardiovascular: Large filling defect present within the distal main right pulmonary artery consistent with pulmonary embolus. There is extensive from breasts also present within the right upper lobe pulmonary artery with extension of clot into segmental and subsegmental branches of the right upper lobe. There is additional thrombus within the right middle lobe pulmonary artery, proximal. Additional thrombus present within right lower lobe pulmonary artery and extending into segmental and subsegmental branches. There is additional filling defect within the left lower lobe pulmonary artery with extension of clot into segmental and subsegmental left lower lobe pulmonary arteries. Calculated RV LV ratio is 1.2 suggesting right heart strain. Mild ectasia of the ascending aorta. No aneurysm or dissection. Mediastinum/Nodes: No  significant mediastinal or hilar adenopathy. No axillary adenopathy. Trachea and mainstem bronchi appear normal. The esophagus is within normal limits.Thyroid gland unremarkable. Lungs/Pleura: Lungs demonstrate patchy peripheral right upper lobe pulmonary opacities move which may represent infiltrates or small infarcts. Linear scarring in the apical segment left upper lobe. Patchy dependent atelectasis. No effusions. Within the anterior aspect of the right middle lobe, there is a 4 mm pulmonary nodule, series 5, image number 52. Upper Abdomen: Gallbladder appears contracted. No acute abnormality in the upper abdomen. Musculoskeletal: Mild superior endplate compression of T8. Review of the MIP images confirms the above findings. IMPRESSION: 1. Extensive acute pulmonary embolus within the distal right main pulmonary artery with extension into right upper lobe,  right middle lobe and right lower lobe pulmonary arteries. Additional acute embolus present within left lower lobe pulmonary artery and segmental and subsegmental branches. Positive for acute PE with CT evidence of right heart strain (RV/LV Ratio = 1.2) consistent with at least submassive (intermediate risk) PE. The presence of right heart strain has been associated with an increased risk of morbidity and mortality. Please activate Code PE by paging [phone]. 2. Peripheral ground-glass densities in the right upper lobe suspected to represent pulmonary infarcts. Infiltrates are also a possibility. 3. 4 mm lung nodule in the right middle lobe. No follow-up needed if patient is low-risk. Non-contrast chest CT can be considered in 12 months if patient is high-risk. This recommendation follows the consensus statement: Guidelines for Management of Incidental Pulmonary Nodules Detected on CT Images: From the Fleischner Society 2017; Radiology 2017; 284:228-243. Critical Value/emergent results were called by telephone at the time of interpretation on 02/11/2016 at 4:17 pm to Dr. Clayton Bibles , who verbally acknowledged these results. Electronically Signed   By: Donavan Foil M.D.   On: 02/11/2016 16:18   US Venous Img Lower Bilateral  Result Date: 02/11/2016 CLINICAL DATA:  Pulmonary embolus demonstrated earlier in the days. Lower extremity pain EXAM: BILATERAL LOWER EXTREMITY VENOUS DUPLEX ULTRASOUND TECHNIQUE: Gray-scale sonography with graded compression, as well as color Doppler and duplex ultrasound were performed to evaluate the lower extremity deep venous systems from the level of the common femoral vein and including the common femoral, femoral, profunda femoral, popliteal and calf veins including the posterior tibial, peroneal and gastrocnemius veins when visible. The superficial great saphenous vein was also interrogated. Spectral Doppler was utilized to evaluate flow at rest and with distal augmentation  maneuvers in the common femoral, femoral and popliteal veins. COMPARISON:  None. FINDINGS: RIGHT LOWER EXTREMITY Common Femoral Vein: There is thrombus throughout the right common femoral vein which appears acute. There is only limited Doppler signal in this vessel and no compression or augmentation. Saphenofemoral Junction: No evidence of thrombus. Normal compressibility and flow on color Doppler imaging. Profunda Femoral Vein: There is acute appearing thrombus which appears occlusive. Absent Doppler signal. No compression or augmentation. Femoral Vein: There is acute appearing thrombus which appears occlusive. Absent Doppler signal. No compression or augmentation. Popliteal Vein: There is acute appearing thrombus which appears occlusive. Absent Doppler signal. No compression or augmentation. Calf Veins: Thrombus is seen in the proximal posterior tibial vein. The peroneal vein appears normal. Superficial Great Saphenous Vein: No evidence of thrombus. Normal compressibility and flow on color Doppler imaging. Venous Reflux:  None. Other Findings:  None. LEFT LOWER EXTREMITY Common Femoral Vein: No evidence of thrombus. Normal compressibility, respiratory phasicity and response to augmentation. Saphenofemoral Junction: No evidence of thrombus. Normal compressibility and flow on color Doppler imaging. Profunda  Femoral Vein: No evidence of thrombus. Normal compressibility and flow on color Doppler imaging. Femoral Vein: No evidence of thrombus. Normal compressibility, respiratory phasicity and response to augmentation. Popliteal Vein: No evidence of thrombus. Normal compressibility, respiratory phasicity and response to augmentation. Calf Veins: No evidence of thrombus. Normal compressibility and flow on color Doppler imaging. Superficial Great Saphenous Vein: No evidence of thrombus. Normal compressibility and flow on color Doppler imaging. Venous Reflux:  None. Other Findings:  None. IMPRESSION: Extensive acute  appearing deep venous thrombosis throughout the right lower extremity venous system as described above. No thrombus is seen in the left lower extremity venous system. Electronically Signed   By: Lowella Grip III M.D.   On: 02/11/2016 16:49        Scheduled Meds: . dicyclomine  10 mg Oral TID AC & HS  . pantoprazole  40 mg Oral Daily   Continuous Infusions: . heparin 1,250 Units/hr (02/13/16 0600)     LOS: 2 days    Time spent: [place], MD [facility] Pager [phone]  If 7PM-7AM, please contact night-coverage [email or web address] Password [email or web address] 02/13/2016, 11:14 AM

## 2016-02-13 NOTE — Progress Notes (Signed)
[place] for heparin > xarelto Indication: pulmonary embolus  Allergies  Allergen Reactions  . Dexilant [Dexlansoprazole] Other (See Comments)    Muscle aches  . Ivp Dye [Iodinated Diagnostic Agents] Rash  . Soma [Carisoprodol] Other (See Comments)    Sores on arm  . Sulfamethoxazole Other (See Comments)    REACTION: unspecified    Patient Measurements: Height: 6' (182.9 cm) Weight: 170 lb 6.7 oz (77.3 kg) IBW/kg (Calculated) : 77.6 Heparin Dosing Weight: 77 kg  Vital Signs: Temp: 97.9 F (36.6 C) (11/01 1448) Temp Source: Oral (11/01 1448) BP: 128/94 (11/01 1448) Pulse Rate: 95 (11/01 1448)  Labs:  Recent Labs  02/11/16 1351  02/11/16 2326 02/12/16 0519 02/12/16 1108 02/13/16 0418  HGB 15.0  --   --  14.4  --  13.2  HCT 43.4  --   --  41.3  --  38.7*  PLT 169  --   --  179  --  151  HEPARINUNFRC  --   --   --  0.67 0.71* 0.60  CREATININE 0.83  --   --  0.86  --   --   TROPONINI  --   < > <0.03 <0.03 <0.03  --   < > = values in this interval not displayed.  Estimated Creatinine Clearance: 94.9 mL/min (by C-G formula based on SCr of 0.86 mg/dL).  Assessment: 65 y.o. male continues on heparin for PE and extensive DVT of RLE. Pharmacy consulted to transition to [place]. Slight drop in CBC. No bleeding reported.  Communicated with RN to turn off heparin drip when 1st dose of Xarelto given.  Goal of Therapy:  Heparin level 0.3-0.7 units/ml Monitor platelets by anticoagulation protocol: Yes   Plan:  Heparin IV > Xarelto 15mg  BID with meals x 21 days; then 20mg  Qsupper Monitor CBC, s/sx bleeding   Elicia Lamp, PharmD, [facility] Clinical Pharmacist Pager [phone] 02/13/2016 4:55 PM

## 2016-02-13 NOTE — Progress Notes (Signed)
[place] for heparin Indication: pulmonary embolus  Allergies  Allergen Reactions  . Dexilant [Dexlansoprazole] Other (See Comments)    Muscle aches  . Ivp Dye [Iodinated Diagnostic Agents] Rash  . Soma [Carisoprodol] Other (See Comments)    Sores on arm  . Sulfamethoxazole Other (See Comments)    REACTION: unspecified    Patient Measurements: Height: 6' (182.9 cm) Weight: 170 lb 6.7 oz (77.3 kg) IBW/kg (Calculated) : 77.6 Heparin Dosing Weight: 77 kg  Vital Signs: Temp: 97.7 F (36.5 C) (11/01 0746) Temp Source: Oral (11/01 0746) BP: 131/80 (11/01 0746) Pulse Rate: 84 (11/01 0746)  Labs:  Recent Labs  02/11/16 1351  02/11/16 2326 02/12/16 0519 02/12/16 1108 02/13/16 0418  HGB 15.0  --   --  14.4  --  13.2  HCT 43.4  --   --  41.3  --  38.7*  PLT 169  --   --  179  --  151  HEPARINUNFRC  --   --   --  0.67 0.71* 0.60  CREATININE 0.83  --   --  0.86  --   --   TROPONINI  --   < > <0.03 <0.03 <0.03  --   < > = values in this interval not displayed.  Estimated Creatinine Clearance: 94.9 mL/min (by C-G formula based on SCr of 0.86 mg/dL).  Assessment: 65 y.o. male continues on heparin for PE and extensive DVT of RLE. HL remains therapeutic at 0.60. Slight drop in CBC - Hgb 14.4>13.2, plt 179>151. No bleeding reported. Plan to transition to [place] or eliquis pending insurance coverage.   Goal of Therapy:  Heparin level 0.3-0.7 units/ml Monitor platelets by anticoagulation protocol: Yes   Plan:  -Continue heparin at 1250 units/hr -Daily heparin level and CBC -Will follow oral anticoagulation plans  Stephens November, PharmD Clinical Pharmacist 12:46 PM, 02/13/2016

## 2016-02-14 DIAGNOSIS — M79661 Pain in right lower leg: Secondary | ICD-10-CM

## 2016-02-14 LAB — CBC
HEMATOCRIT: 39.1 % (ref 39.0–52.0)
Hemoglobin: 13.5 g/dL (ref 13.0–17.0)
MCH: 30.7 pg (ref 26.0–34.0)
MCHC: 34.5 g/dL (ref 30.0–36.0)
MCV: 88.9 fL (ref 78.0–100.0)
Platelets: 152 10*3/uL (ref 150–400)
RBC: 4.4 MIL/uL (ref 4.22–5.81)
RDW: 13.8 % (ref 11.5–15.5)
WBC: 6.9 10*3/uL (ref 4.0–10.5)

## 2016-02-14 LAB — BASIC METABOLIC PANEL
Anion gap: 7 (ref 5–15)
BUN: 17 mg/dL (ref 6–20)
CALCIUM: 9 mg/dL (ref 8.9–10.3)
CHLORIDE: 106 mmol/L (ref 101–111)
CO2: 27 mmol/L (ref 22–32)
CREATININE: 0.95 mg/dL (ref 0.61–1.24)
GFR calc non Af Amer: >60 mL/min (ref >60)
GLUCOSE: 82 mg/dL (ref 65–99)
Potassium: 4.3 mmol/L (ref 3.5–5.1)
Sodium: 140 mmol/L (ref 135–145)

## 2016-02-14 LAB — CARDIOLIPIN ANTIBODIES, IGG, IGM, IGA
Anticardiolipin IgG: <9 GPL U/mL (ref 0–14)
Anticardiolipin IgM: <9 MPL U/mL (ref 0–12)

## 2016-02-14 MED ORDER — RIVAROXABAN 20 MG PO TABS: 20.0000 mg | ORAL_TABLET | Freq: Every day | ORAL | 2 refills | Status: DC

## 2016-02-14 MED ORDER — RIVAROXABAN (XARELTO) VTE STARTER PACK (15 & 20 MG): ORAL_TABLET | ORAL | 0 refills | Status: DC

## 2016-02-14 NOTE — Discharge Summary (Signed)
Physician Discharge Summary  David Armstrong [id number] DOB: 27-Mar-1951 DOA: 02/11/2016  PCP: Emeterio Reeve, DO  Admit date: 02/11/2016 Discharge date: 02/14/2016  Time spent: 65 minutes  Recommendations for Outpatient Follow-up:  1. Follow-up with Emeterio Reeve, DO in 1-2 weeks. On follow-up anti-Cardolipin antibody and anti-beta glycoprotein antibody will need to be followed up upon. Patient will need a CBC done to follow-up on his H&H.   Discharge Diagnoses:  Principal Problem:   Bilateral pulmonary embolism (HCC) Active Problems:   DVT, lower extremity, proximal, acute, right (HCC)   DVT, lower extremity, distal, acute, right (HCC)   Anxiety state   Peripheral neuropathy ([place])   Pulmonary embolism ([place])   Discharge Condition: Stable and improved  Diet recommendation: Regular  Filed Weights   02/12/16 0318 02/13/16 0600 02/14/16 0438  Weight: 77.1 kg (169 lb 15.6 oz) 77.3 kg (170 lb 6.7 oz) 78 kg (171 lb 15.3 oz)    History of present illness:  Per Dr Peggyann Juba is a 65 y.o. male has been experiencing shortness of breath and right calf pain for last 2 weeks. Patient stated the shortness of breath increased on lying flat. Patient also had been having calf pain on walking. Patient noticed some swelling in the right calf. Since his shortness of breath and calf pain persisted he had gone to the urgent care center on Friday, October 27 and had blood work done. On the day of admission, patient was instructed to come to the ER because patient's d-dimer was significantly elevated. CT angiogram of the chest and Dopplers of the lower extremity was done which was positive for bilateral pulmonary embolism with possible right heart strain and extensive DVT of the right lower extremity. Patient was hemodynamically stable and admitted for further management. Patient denied having recent long distance travel. Has not had any previous episodes of PE or DVT or any  family history of venous thromboembolism. Patient stated in April he had foot surgery. Otherwise he has been always ambulatory.   ED Course: CT angiogram showed bilateral PE and Doppler showed right lower extremity DVT. Patient started on heparin. He had physician did discuss with pulmonary critical care.  Hospital Course:  #1 acute bilateral PE/extensive acute DVT throughout right lower extremity venous system Patient was admitted with a two-week history of shortness of breath and calf pain. CT angiogram of chest was done which was consistent with acute bilateral PE. Lower extremity Dopplers done did show an extensive acute DVT throughout the right lower extremity venous system. Patient was admitted placed on telemetry and placed on IV heparin.  2-D echo was done to rule out right ventricular strain, as there was some concern on CT angiogram of the chest. 2-D echo showed a EF of 65-70% with no wall motion abnormalities. Right systolic function was normal. Right lower extremity with no edema, no erythema, no significant tenderness to palpation. Patient improved clinically did not have any further chest pain or shortness of breath with improvement with calf pain. An anti-Cardiolipin antibody as well as anti-beta glycoprotein antibody was ordered and pending at time of discharge which will need to be followed up upon. Patient was subsequently transitioned from IV heparin to oral Xarelto which she tolerated. Patient did not have any signs of bleeding. As this is likely an unprovoked, DVT patient will likely need to be on prolonged anticoagulation. Patient will be discharged home on Xarelto with outpatient follow-up with PCP.   #2 gastroesophageal reflux disease Patient was maintained  on a PPI.  #3 general anxiety state Ativan as needed.  Procedures:  CT angiogram chest 02/11/2016  Lower extremity Dopplers 02/11/2016  2-D echo 02/12/2016   Consultations:  Curb sided hematology  Discharge  Exam: Vitals:   02/14/16 0438 02/14/16 0803  BP: 110/74 117/67  Pulse: 73 82  Resp: 15 16  Temp: 98.1 F (36.7 C) 97.9 F (36.6 C)    General: NAD Cardiovascular: RRR Respiratory: CTAB  Discharge Instructions   Discharge Instructions    Diet general    Complete by:  As directed    Increase activity slowly    Complete by:  As directed      Current Discharge Medication List    START taking these medications   Details  rivaroxaban (XARELTO) 20 MG TABS tablet Take 1 tablet (20 mg total) by mouth daily with supper. Qty: 30 tablet, Refills: 2    Rivaroxaban 15 & 20 MG TBPK Take as directed on package: Start with one 15mg  tablet by mouth twice a day with food. On Day 22, switch to one 20mg  tablet once a day with food. Qty: 51 each, Refills: 0      CONTINUE these medications which have NOT CHANGED   Details  desonide (DESOWEN) 0.05 % lotion Apply 1 application topically daily as needed (for skin).    fluticasone (FLONASE) 50 MCG/ACT nasal spray Place 2 sprays into both nostrils daily as needed for allergies or rhinitis.    LORazepam (ATIVAN) 1 MG tablet Take 1 mg by mouth every 8 (eight) hours as needed for anxiety.     naproxen (NAPROSYN) 500 MG tablet Take 1 tablet (500 mg total) by mouth 2 (two) times daily with a meal. Qty: 60 tablet, Refills: 0   Associated Diagnoses: Acute right-sided low back pain without sciatica    omeprazole (PRILOSEC) 20 MG capsule Take 1 capsule (20 mg total) by mouth daily. Qty: 30 capsule, Refills: 0   Associated Diagnoses: Acute right-sided low back pain without sciatica    dicyclomine (BENTYL) 10 MG capsule Take 1 capsule (10 mg total) by mouth 4 (four) times daily -  before meals and at bedtime. Qty: 90 capsule, Refills: 1    ipratropium (ATROVENT) 0.06 % nasal spray Place 2 sprays into both nostrils every 4 (four) hours as needed for rhinitis. Qty: 10 mL, Refills: 6    tiZANidine (ZANAFLEX) 4 MG tablet Take 1 tablet (4 mg total) by  mouth every 6 (six) hours as needed for muscle spasms. Qty: 30 tablet, Refills: 0   Associated Diagnoses: Acute right-sided low back pain without sciatica      STOP taking these medications     linaclotide (LINZESS) 290 MCG CAPS capsule      predniSONE (DELTASONE) 50 MG tablet      topiramate (TOPAMAX) 25 MG tablet        Allergies  Allergen Reactions  . Dexilant [Dexlansoprazole] Other (See Comments)    Muscle aches  . Ivp Dye [Iodinated Diagnostic Agents] Rash  . Soma [Carisoprodol] Other (See Comments)    Sores on arm  . Sulfamethoxazole Other (See Comments)    REACTION: unspecified   Follow-up Information    Emeterio Reeve, DO. Schedule an appointment as soon as possible for a visit in 2 week(s).   Specialty:  Osteopathic Medicine Why:  f/u in 1-2 weeks. Contact information: [id number] [place] [address] [phone]            The results of significant  diagnostics from this hospitalization (including imaging, microbiology, ancillary and laboratory) are listed below for reference.    Significant Diagnostic Studies: Dg Chest 2 View  Result Date: 01/29/2016 CLINICAL DATA:  Shortness of breath, fever, cough and congestion for 2 weeks. EXAM: CHEST  2 VIEW COMPARISON:  01/22/2016 and prior chest radiographs FINDINGS: The cardiomediastinal silhouette is unremarkable. Minimal left basilar scarring again noted. There is no evidence of focal airspace disease, pulmonary edema, suspicious pulmonary nodule/mass, pleural effusion, or pneumothorax. No acute bony abnormalities are identified. IMPRESSION: No evidence of active cardiopulmonary disease. Electronically Signed   By: Margarette Canada M.D.   On: 01/29/2016 14:25   Dg Chest 2 View  Result Date: 01/22/2016 CLINICAL DATA:  Cough and chest congestion and body aches for the past 5 days EXAM: CHEST  2 VIEW COMPARISON:  PA and lateral chest x-ray of March 26, 2015 and January 14, 2015. FINDINGS: The  lungs are adequately inflated. There is no focal infiltrate. Subtle linear density lateral to the left cardiac apex is chronic and likely reflects scarring. The heart and pulmonary vascularity are normal. The mediastinum is normal in width. There is no pleural effusion or pneumothorax. The bony thorax exhibits no acute abnormality. IMPRESSION: There is no active cardiopulmonary disease. Electronically Signed   By: David  Martinique M.D.   On: 01/22/2016 15:02   Dg Lumbar Spine Complete  Result Date: 02/05/2016 CLINICAL DATA:  Acute right-sided lower back pain radiating to right hip times 1-2 weeks. No injury. EXAM: LUMBAR SPINE - COMPLETE 4+ VIEW COMPARISON:  CT from 01/17/2016 FINDINGS: Numerous left-sided renal stones are again identified, the largest measuring approximately 9 x 5 mm. There is disc space narrowing at L5-S1. There is bilateral mild facet hypertrophy and sclerosis from L1 through S1. There is no spondylolysis nor spondylolisthesis. The sacroiliac joints are symmetric in appearance. No acute osseous abnormality. 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 No acute compression fracture of the lumbar vertebrae. Multiple left-sided renal calculi. Electronically Signed   By: Ashley Royalty M.D.   On: 02/05/2016 17:27   Dg Pelvis 1-2 Views  Result Date: 02/05/2016 CLINICAL DATA:  Right-sided lower back pain radiating to the right hip. EXAM: PELVIS - 1-2 VIEW COMPARISON:  01/17/2016 CT FINDINGS: Slight joint space narrowing of both hips with hypertrophic spurring off of the acetabular roofs bilaterally. Numerous phleboliths are seen in the pelvis. The bony pelvis appears intact without acute osseous abnormality. Minimal calcification adjacent to the left greater trochanter consistent with gluteal tendinopathy. Minimal calcific atherosclerosis of the right internal iliac. IMPRESSION: No acute osseous abnormality. Slight joint space narrowing of both hips  with acetabular spurring consistent with a mild osteoarthritis. Gluteal calcific tendinopathy suggested on the left. Electronically Signed   By: Ashley Royalty M.D.   On: 02/05/2016 17:30   Ct Angio Chest Pe W/cm &/or Wo Cm  Result Date: 02/11/2016 CLINICAL DATA:  Elevated D-dimer right calf pain and shortness of breath EXAM: CT ANGIOGRAPHY CHEST WITH CONTRAST TECHNIQUE: Multidetector CT imaging of the chest was performed using the standard protocol during bolus administration of intravenous contrast. Multiplanar CT image reconstructions and MIPs were obtained to evaluate the vascular anatomy. CONTRAST:  100 cc Isovue 370 intravenous COMPARISON:  Chest x-ray 01/29/2016 FINDINGS: Cardiovascular: Large filling defect present within the distal main right pulmonary artery consistent with pulmonary embolus. There is extensive from breasts also present within the right upper lobe pulmonary artery with extension of clot into segmental and subsegmental branches of  the right upper lobe. There is additional thrombus within the right middle lobe pulmonary artery, proximal. Additional thrombus present within right lower lobe pulmonary artery and extending into segmental and subsegmental branches. There is additional filling defect within the left lower lobe pulmonary artery with extension of clot into segmental and subsegmental left lower lobe pulmonary arteries. Calculated RV LV ratio is 1.2 suggesting right heart strain. Mild ectasia of the ascending aorta. No aneurysm or dissection. Mediastinum/Nodes: No significant mediastinal or hilar adenopathy. No axillary adenopathy. Trachea and mainstem bronchi appear normal. The esophagus is within normal limits.Thyroid gland unremarkable. Lungs/Pleura: Lungs demonstrate patchy peripheral right upper lobe pulmonary opacities move which may represent infiltrates or small infarcts. Linear scarring in the apical segment left upper lobe. Patchy dependent atelectasis. No effusions.  Within the anterior aspect of the right middle lobe, there is a 4 mm pulmonary nodule, series 5, image number 52. Upper Abdomen: Gallbladder appears contracted. No acute abnormality in the upper abdomen. Musculoskeletal: Mild superior endplate compression of T8. Review of the MIP images confirms the above findings. IMPRESSION: 1. Extensive acute pulmonary embolus within the distal right main pulmonary artery with extension into right upper lobe, right middle lobe and right lower lobe pulmonary arteries. Additional acute embolus present within left lower lobe pulmonary artery and segmental and subsegmental branches. Positive for acute PE with CT evidence of right heart strain (RV/LV Ratio = 1.2) consistent with at least submassive (intermediate risk) PE. The presence of right heart strain has been associated with an increased risk of morbidity and mortality. Please activate Code PE by paging [phone]. 2. Peripheral ground-glass densities in the right upper lobe suspected to represent pulmonary infarcts. Infiltrates are also a possibility. 3. 4 mm lung nodule in the right middle lobe. No follow-up needed if patient is low-risk. Non-contrast chest CT can be considered in 12 months if patient is high-risk. This recommendation follows the consensus statement: Guidelines for Management of Incidental Pulmonary Nodules Detected on CT Images: From the Fleischner Society 2017; Radiology 2017; 284:228-243. Critical Value/emergent results were called by telephone at the time of interpretation on 02/11/2016 at 4:17 pm to Dr. Clayton Bibles , who verbally acknowledged these results. Electronically Signed   By: Donavan Foil M.D.   On: 02/11/2016 16:18   US Venous Img Lower Bilateral  Result Date: 02/11/2016 CLINICAL DATA:  Pulmonary embolus demonstrated earlier in the days. Lower extremity pain EXAM: BILATERAL LOWER EXTREMITY VENOUS DUPLEX ULTRASOUND TECHNIQUE: Gray-scale sonography with graded compression, as well as color  Doppler and duplex ultrasound were performed to evaluate the lower extremity deep venous systems from the level of the common femoral vein and including the common femoral, femoral, profunda femoral, popliteal and calf veins including the posterior tibial, peroneal and gastrocnemius veins when visible. The superficial great saphenous vein was also interrogated. Spectral Doppler was utilized to evaluate flow at rest and with distal augmentation maneuvers in the common femoral, femoral and popliteal veins. COMPARISON:  None. FINDINGS: RIGHT LOWER EXTREMITY Common Femoral Vein: There is thrombus throughout the right common femoral vein which appears acute. There is only limited Doppler signal in this vessel and no compression or augmentation. Saphenofemoral Junction: No evidence of thrombus. Normal compressibility and flow on color Doppler imaging. Profunda Femoral Vein: There is acute appearing thrombus which appears occlusive. Absent Doppler signal. No compression or augmentation. Femoral Vein: There is acute appearing thrombus which appears occlusive. Absent Doppler signal. No compression or augmentation. Popliteal Vein: There is acute appearing thrombus which appears occlusive. Absent  Doppler signal. No compression or augmentation. Calf Veins: Thrombus is seen in the proximal posterior tibial vein. The peroneal vein appears normal. Superficial Great Saphenous Vein: No evidence of thrombus. Normal compressibility and flow on color Doppler imaging. Venous Reflux:  None. Other Findings:  None. LEFT LOWER EXTREMITY Common Femoral Vein: No evidence of thrombus. Normal compressibility, respiratory phasicity and response to augmentation. Saphenofemoral Junction: No evidence of thrombus. Normal compressibility and flow on color Doppler imaging. Profunda Femoral Vein: No evidence of thrombus. Normal compressibility and flow on color Doppler imaging. Femoral Vein: No evidence of thrombus. Normal compressibility, respiratory  phasicity and response to augmentation. Popliteal Vein: No evidence of thrombus. Normal compressibility, respiratory phasicity and response to augmentation. Calf Veins: No evidence of thrombus. Normal compressibility and flow on color Doppler imaging. Superficial Great Saphenous Vein: No evidence of thrombus. Normal compressibility and flow on color Doppler imaging. Venous Reflux:  None. Other Findings:  None. IMPRESSION: Extensive acute appearing deep venous thrombosis throughout the right lower extremity venous system as described above. No thrombus is seen in the left lower extremity venous system. Electronically Signed   By: Lowella Grip III M.D.   On: 02/11/2016 16:49    Microbiology: Recent Results (from the past 240 hour(s))  MRSA PCR Screening     Status: None   Collection Time: 02/11/16  9:34 PM  Result Value Ref Range Status   MRSA by PCR NEGATIVE NEGATIVE Final    Comment:        The GeneXpert MRSA Assay (FDA approved for NASAL specimens only), is one component of a comprehensive MRSA colonization surveillance program. It is not intended to diagnose MRSA infection nor to guide or monitor treatment for MRSA infections.      Labs: Basic Metabolic Panel:  Recent Labs Lab 02/08/16 1400 02/11/16 1351 02/12/16 0519 02/14/16 0845  NA 141 139 140 140  K 4.1 3.4* 4.4 4.3  CL 107 107 108 106  CO2 26 25 24 27   GLUCOSE 76 134* 188* 82  BUN 16 15 14 17   CREATININE 0.84 0.83 0.86 0.95  CALCIUM 8.7 8.5* 9.1 9.0   Liver Function Tests:  Recent Labs Lab 02/08/16 1400 02/12/16 0519  AST 18 22  ALT 15 17  ALKPHOS 96 80  BILITOT 0.6 0.7  PROT 6.5 6.0*  ALBUMIN 3.8 3.0*   No results for input(s): LIPASE, AMYLASE in the last 168 hours. No results for input(s): AMMONIA in the last 168 hours. CBC:  Recent Labs Lab 02/08/16 1400 02/11/16 1351 02/12/16 0519 02/13/16 0418 02/14/16 0845  WBC 7.0 6.6 9.5 9.4 6.9  NEUTROABS  --  4.5 8.4* 6.8  --   HGB 14.5 15.0  14.4 13.2 13.5  HCT 42.8 43.4 41.3 38.7* 39.1  MCV 90.5 88.6 87.1 89.6 88.9  PLT 150 169 179 151 152   Cardiac Enzymes:  Recent Labs Lab 02/11/16 1630 02/11/16 2326 02/12/16 0519 02/12/16 1108  TROPONINI <0.03 <0.03 <0.03 <0.03   BNP: BNP (last 3 results)  Recent Labs  02/08/16 1400 02/11/16 1351  BNP 94.9 24.6    ProBNP (last 3 results) No results for input(s): PROBNP in the last 8760 hours.  CBG: No results for input(s): GLUCAP in the last 168 hours.     SignedIrine Seal MD.  [facility] 02/14/2016, 11:56 AM

## 2016-02-14 NOTE — Clinical Social Work Note (Signed)
Patient medically stable for discharge today and request made for cab voucher as patient's vehicle is at [facility].   Voucher provided to patient's nurse.  Inaara Tye Givens, MSW, LCSW Licensed Clinical Social Worker [place] [phone]

## 2016-02-15 LAB — BETA-2-GLYCOPROTEIN I ABS, IGG/M/A
Beta-2 Glyco I IgG: <9 GPI IgG units (ref 0–20)
Beta-2-Glycoprotein I IgA: <9 GPI IgA units (ref 0–25)
Beta-2-Glycoprotein I IgM: <9 GPI IgM units (ref 0–32)

## 2016-02-20 ENCOUNTER — Encounter: Payer: Self-pay | Admitting: Osteopathic Medicine

## 2016-02-20 ENCOUNTER — Ambulatory Visit (INDEPENDENT_AMBULATORY_CARE_PROVIDER_SITE_OTHER): Payer: Medicare Other | Admitting: Osteopathic Medicine

## 2016-02-20 VITALS — BP 121/89 | HR 78 | Ht 72.0 in | Wt 173.0 lb

## 2016-02-20 DIAGNOSIS — I2699 Other pulmonary embolism without acute cor pulmonale: Secondary | ICD-10-CM | POA: Diagnosis not present

## 2016-02-20 DIAGNOSIS — I824Y1 Acute embolism and thrombosis of unspecified deep veins of right proximal lower extremity: Secondary | ICD-10-CM | POA: Diagnosis not present

## 2016-02-20 DIAGNOSIS — I824Z1 Acute embolism and thrombosis of unspecified deep veins of right distal lower extremity: Secondary | ICD-10-CM | POA: Diagnosis not present

## 2016-02-20 LAB — CBC
HCT: 43.8 % (ref 38.5–50.0)
HEMOGLOBIN: 14.7 g/dL (ref 13.2–17.1)
MCH: 30.2 pg (ref 27.0–33.0)
MCHC: 33.6 g/dL (ref 32.0–36.0)
MCV: 90.1 fL (ref 80.0–100.0)
MPV: 10 fL (ref 7.5–12.5)
PLATELETS: 254 10*3/uL (ref 140–400)
RBC: 4.86 MIL/uL (ref 4.20–5.80)
RDW: 14 % (ref 11.0–15.0)
WBC: 7.9 10*3/uL (ref 3.8–10.8)

## 2016-02-20 MED ORDER — OMEPRAZOLE 20 MG PO CPDR: 20.0000 mg | DELAYED_RELEASE_CAPSULE | Freq: Every day | ORAL | 3 refills | Status: DC

## 2016-02-20 NOTE — Patient Instructions (Addendum)
Will plan to be on the blood thinner for at least 6 months! Let's see you back here in 3 months for you annual physical, sooner if needed.   Again, if any chest pains or trouble breathing, go straight to ER! Please let me know if you have any other questions or concerns! -Dr. Loni Muse.

## 2016-02-20 NOTE — Progress Notes (Signed)
HPI: David Armstrong is a 65 y.o. male  who presents to [facility] today, 02/20/16,  for chief complaint of:  Chief Complaint  Patient presents with  . Hospitalization Follow-up    DVT/PE    Recently hospitalized for DVT/PE. Patient had been complaining of low back pain which developed into more severe leg pain and shortness of breath so went to the emergency room. Currently on Xarelto blood thinner. Labs and hospital records reviewed. Imaging personally reviewed with the patient. Has a lot of questions including what may have caused this, how long he will need to be on medications. He reports that on the right lower extremity he was also wearing some type of below the knee exercise band, not sure whether this may have had anything to do with the venous stasis issue.  Past medical, surgical, social and family history reviewed: Past Medical History:  Diagnosis Date  . Anal fissure   . ANXIETY   . Arthritis    "lower back; hips;' right knee" (02/11/2016)  . Bilateral pulmonary embolism ([place]) 02/11/2016  . Bipolar disorder ([place])   . Chronic constipation   . Chronic lower back pain   . DEPRESSION   . DIVERTICULOSIS, COLON [phone]   Colonoscopy  . DVT (deep venous thrombosis) ([place]) 02/11/2016   RLE  . Fatty liver   . GERD   . H. pylori infection   . Hemorrhoids [id number]   Colonoscopy   . Hiatal hernia [id number]   EGD  . History of kidney stones   . HYPERLIPIDEMIA   . IBS (irritable bowel syndrome)   . Internal hemorrhoids   . Peripheral neuropathy ([place])   . Personal history of colonic polyps 10/27/2007   HYPERPLASTIC POLYP  . Pneumonia 12/2014  . TMJ (temporomandibular joint syndrome)   . Unspecified gastritis and gastroduodenitis without mention of hemorrhage 2009   EGD   Past Surgical History:  Procedure Laterality Date  . BELPHAROPTOSIS REPAIR Bilateral 01/2015   "droopy eyelids"  . CYSTOSCOPY/RETROGRADE/URETEROSCOPY/STONE  EXTRACTION WITH BASKET  1987; 1992; 1999; 2010  . EXCISIONAL HEMORRHOIDECTOMY    . FOOT NEUROMA SURGERY Left 09/2015  . FOOT NEUROMA SURGERY Right 07/2015  . FOOT NEUROMA SURGERY Right 2004   "between big and 2nd toes; shortened my 2nd toe"  . FRACTURE SURGERY    . HAMMER TOE SURGERY Left    "little toe"  . INCISION AND DRAINAGE FOOT Left 1992   "ball of foot; stepped on piece of hard wire & it went thru my shoe"  . KNEE ARTHROSCOPY Bilateral   . LITHOTRIPSY  1992  . NASAL FRACTURE SURGERY  1997  . PARTIAL KNEE ARTHROPLASTY Right 06/2003  . PLANTAR FASCIA RELEASE Bilateral 1987-1993   right-left  . RHINOPLASTY  2000  . SHOULDER ARTHROSCOPY Left 03/2011; 2013  . SHOULDER ARTHROSCOPY Right 2007   "frozen shoulder"  . TOENAIL EXCISION Bilateral 2012   "big toes"   Social History  Substance Use Topics  . Smoking status: Never Smoker  . Smokeless tobacco: Never Used  . Alcohol use No   Family History  Problem Relation Age of Onset  . Heart disease Mother   . Stroke Mother   . Cirrhosis Father   . Heart disease Father   . Skin cancer Father   . Prostate cancer Maternal Grandfather   . Parkinson's disease Paternal Grandfather   . Heart disease Brother   . Colon cancer Neg Hx      Current medication list  and allergy/intolerance information reviewed:   Current Outpatient Prescriptions on File Prior to Visit  Medication Sig Dispense Refill  . desonide (DESOWEN) 0.05 % lotion Apply 1 application topically daily as needed (for skin).    Marland Kitchen dicyclomine (BENTYL) 10 MG capsule Take 1 capsule (10 mg total) by mouth 4 (four) times daily -  before meals and at bedtime. 90 capsule 1  . fluticasone (FLONASE) 50 MCG/ACT nasal spray Place 2 sprays into both nostrils daily as needed for allergies or rhinitis.    Marland Kitchen ipratropium (ATROVENT) 0.06 % nasal spray Place 2 sprays into both nostrils every 4 (four) hours as needed for rhinitis. 10 mL 6  . LORazepam (ATIVAN) 1 MG tablet Take 1 mg by  mouth every 8 (eight) hours as needed for anxiety.     . naproxen (NAPROSYN) 500 MG tablet Take 1 tablet (500 mg total) by mouth 2 (two) times daily with a meal. (Patient taking differently: Take 500 mg by mouth 2 (two) times daily as needed for moderate pain. ) 60 tablet 0  . omeprazole (PRILOSEC) 20 MG capsule Take 1 capsule (20 mg total) by mouth daily. (Patient taking differently: Take 20 mg by mouth daily as needed (for acid reflux). ) 30 capsule 0  . [START ON 03/15/2016] rivaroxaban (XARELTO) 20 MG TABS tablet Take 1 tablet (20 mg total) by mouth daily with supper. 30 tablet 2  . Rivaroxaban 15 & 20 MG TBPK Take as directed on package: Start with one 15mg  tablet by mouth twice a day with food. On Day 22, switch to one 20mg  tablet once a day with food. 51 each 0  . tiZANidine (ZANAFLEX) 4 MG tablet Take 1 tablet (4 mg total) by mouth every 6 (six) hours as needed for muscle spasms. 30 tablet 0   No current facility-administered medications on file prior to visit.    Allergies  Allergen Reactions  . Dexilant [Dexlansoprazole] Other (See Comments)    Muscle aches  . Ivp Dye [Iodinated Diagnostic Agents] Rash  . Soma [Carisoprodol] Other (See Comments)    Sores on arm  . Sulfamethoxazole Other (See Comments)    REACTION: unspecified      Review of Systems:  Constitutional: No recent illness  HEENT: No  headache, no vision change  Cardiac: No  chest pain, No  pressure, No palpitations  Respiratory:  No  shortness of breath. No  Cough  Gastrointestinal: No  abdominal pain, no change on bowel habits  Musculoskeletal: No new myalgia/arthralgia  Skin: No  Rash  Hem/Onc: +easy bruising/bleedingOn blood thinner, no bleeding gums, no blood in stool.  Neurologic: No  weakness, No  Dizziness   Exam:  BP 121/89   Pulse 78   Ht 6' (1.829 m)   Wt 173 lb (78.5 kg)   BMI 23.46 kg/m   Constitutional: VS see above. General Appearance: alert, well-developed, well-nourished,  NAD  Eyes: Normal lids and conjunctive, non-icteric sclera  Ears, Nose, Mouth, Throat: MMM, Normal external inspection ears/nares/mouth/lips/gums.  Neck: No masses, trachea midline.   Respiratory: Normal respiratory effort. no wheeze, no rhonchi, no rales  Cardiovascular: S1/S2 normal, no murmur, no rub/gallop auscultated. RRR.   Musculoskeletal: Gait normal. Symmetric and independent movement of all extremities  Neurological: Normal balance/coordination. No tremor.  Skin: warm, dry, intact.   Psychiatric: Normal judgment/insight. Normal mood and affect. Oriented x3.   Discharge summary reviewed. Anticardiolipin anti-beta glycoprotein antibodies were negative. CBC pending.  "Hospital Course:  #1 acute bilateral PE/extensive acute DVT throughout  right lower extremityvenous system Patient was admitted with a two-week history of shortness of breath and calf pain. CT angiogram of chest was done which was consistent with acute bilateral PE. Lower extremity Dopplers done did show an extensive acute DVT throughout the right lower extremity venous system. Patient was admitted placed on telemetry and placed on IV heparin.  2-D echo was done to rule out right ventricular strain, as there was some concern on CT angiogram of the chest. 2-D echo showed a EF of 65-70% with no wall motion abnormalities. Right systolic function was normal. Right lower extremity with no edema, no erythema, no significant tenderness to palpation. Patient improved clinically did not have any further chest pain or shortness of breath with improvement with calf pain. An anti-Cardiolipin antibody as well as anti-beta glycoprotein antibody was ordered and pending at time of discharge which will need to be followed up upon. Patient was subsequently transitioned from IV heparin to oral Xarelto which she tolerated. Patient did not have any signs of bleeding. As this is likely an unprovoked, DVT patient will likely need to be on  prolonged anticoagulation. Patient will be discharged home on Xarelto with outpatient follow-up with PCP."  No results found for this or any previous visit (from the past 24 hour(s)).  No results found.   ASSESSMENT/PLAN:   Hospital course mentions most likely unprovoked DVT, I'm not sure what to make of this additional history that the patient was using some sort of exercise support band just distal to the knee on the right side and whether this may have had anything to do with it. No specific cause of provoked DVT such as prolonged travel, immobilization/surgery, hypercoagulable state of other cause, etc.   I advised that the patient would at least need to be on anticoagulation for 6 months but possibly indefinitely, we can consider repeating ultrasound at that point since he would like to confirm resolution of the DVT, as well as discuss further risks versus benefits of long-term anticoagulation.   Bilateral pulmonary embolism (HCC) - Plan: CBC  DVT, lower extremity, distal, acute, right (HCC)  DVT, lower extremity, proximal, acute, right [facility])    Patient Instructions  Will plan to be on the blood thinner for at least 6 months! Let's see you back here in 3 months for you annual physical, sooner if needed.   Again, if any chest pains or trouble breathing, go straight to ER! Please let me know if you have any other questions or concerns! -Dr. Loni Muse.    Visit summary with medication list and pertinent instructions was printed for patient to review. All questions at time of visit were answered - patient instructed to contact office with any additional concerns. ER/RTC precautions were reviewed with the patient. Follow-up plan: Return in about 3 months (around 05/22/2016) for [place] .  Note: Total time spent 25 minutes, greater than 50% of the visit was spent face-to-face counseling and coordinating care for the following: The primary encounter diagnosis was Bilateral pulmonary embolism  ([place]). Diagnoses of DVT, lower extremity, distal, acute, right (HCC) and DVT, lower extremity, proximal, acute, right ([place]) were also pertinent to this visit.Marland Kitchen

## 2016-02-25 ENCOUNTER — Other Ambulatory Visit: Payer: Medicare Other

## 2016-02-26 DIAGNOSIS — H25013 Cortical age-related cataract, bilateral: Secondary | ICD-10-CM | POA: Diagnosis not present

## 2016-02-26 DIAGNOSIS — H2511 Age-related nuclear cataract, right eye: Secondary | ICD-10-CM | POA: Diagnosis not present

## 2016-02-26 DIAGNOSIS — H2513 Age-related nuclear cataract, bilateral: Secondary | ICD-10-CM | POA: Diagnosis not present

## 2016-02-29 ENCOUNTER — Ambulatory Visit (INDEPENDENT_AMBULATORY_CARE_PROVIDER_SITE_OTHER): Payer: Medicare Other | Admitting: Osteopathic Medicine

## 2016-02-29 ENCOUNTER — Encounter: Payer: Self-pay | Admitting: Osteopathic Medicine

## 2016-02-29 VITALS — BP 132/81 | HR 81 | Ht 72.0 in | Wt 177.0 lb

## 2016-02-29 DIAGNOSIS — I824Y1 Acute embolism and thrombosis of unspecified deep veins of right proximal lower extremity: Secondary | ICD-10-CM

## 2016-02-29 DIAGNOSIS — I824Z1 Acute embolism and thrombosis of unspecified deep veins of right distal lower extremity: Secondary | ICD-10-CM

## 2016-02-29 DIAGNOSIS — I2699 Other pulmonary embolism without acute cor pulmonale: Secondary | ICD-10-CM

## 2016-02-29 NOTE — Progress Notes (Signed)
HPI: David Armstrong is a 65 y.o. male  who presents to [facility] today, 03/03/16,  for chief complaint of:  Chief Complaint  Patient presents with  . Leg Pain    RIGHT     . Contex/Locationt: Recently treated for DVT. Patient is concerned about the soreness in his right leg along the distribution of the affected venous system . Quality: Sore, aching . Duration: Since DVT diagnosis . Assoc signs/symptoms: No shortness of breath, no chest pain    Past medical, surgical, social and family history reviewed: Patient Active Problem List   Diagnosis Date Noted  . Right calf pain   . DVT, lower extremity, proximal, acute, right ([place]) 02/13/2016  . DVT, lower extremity, distal, acute, right ([place]) 02/13/2016  . Bilateral pulmonary embolism ([place]) 02/11/2016  . Pulmonary embolism ([place]) 02/11/2016  . Shortness of breath 02/08/2016  . Hearing loss due to cerumen impaction, right 01/19/2015  . Acute bronchitis 01/15/2015  . Nephrolithiasis 04/19/2014  . Peripheral neuropathy ([place]) 12/15/2013  . Thrombosed external hemorrhoid s/p I&D [id number] 06/09/2012  . 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 IBS (irritable bowel syndrome) 03/09/2012  . Abdominal pain 05/19/2011  . 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 ABDOMINAL PAIN 11/23/2009  . GERD 10/26/2007  . DIVERTICULOSIS, COLON 10/25/2007  . RENAL CALCULUS 10/25/2007  . HEMORRHOIDS 11/24/2006  . Hyperlipidemia 11/23/2006  . Anxiety state 11/23/2006  . DEPRESSION 11/23/2006  . COLONIC POLYPS, HX OF 11/23/2006   Past Surgical History:  Procedure Laterality Date  . BELPHAROPTOSIS REPAIR Bilateral 01/2015   "droopy eyelids"  . CYSTOSCOPY/RETROGRADE/URETEROSCOPY/STONE EXTRACTION WITH BASKET  1987; 1992; 1999; 2010  . EXCISIONAL HEMORRHOIDECTOMY    . FOOT NEUROMA SURGERY Left 09/2015  . FOOT NEUROMA SURGERY Right 07/2015  . FOOT NEUROMA SURGERY Right 2004   "between big and 2nd toes; shortened my  2nd toe"  . FRACTURE SURGERY    . HAMMER TOE SURGERY Left    "little toe"  . INCISION AND DRAINAGE FOOT Left 1992   "ball of foot; stepped on piece of hard wire & it went thru my shoe"  . KNEE ARTHROSCOPY Bilateral   . LITHOTRIPSY  1992  . NASAL FRACTURE SURGERY  1997  . PARTIAL KNEE ARTHROPLASTY Right 06/2003  . PLANTAR FASCIA RELEASE Bilateral 1987-1993   right-left  . RHINOPLASTY  2000  . SHOULDER ARTHROSCOPY Left 03/2011; 2013  . SHOULDER ARTHROSCOPY Right 2007   "frozen shoulder"  . TOENAIL EXCISION Bilateral 2012   "big toes"   Social History  Substance Use Topics  . Smoking status: Never Smoker  . Smokeless tobacco: Never Used  . Alcohol use No   Family History  Problem Relation Age of Onset  . Heart disease Mother   . Stroke Mother   . Cirrhosis Father   . Heart disease Father   . Skin cancer Father   . Prostate cancer Maternal Grandfather   . Parkinson's disease Paternal Grandfather   . Heart disease Brother   . Colon cancer Neg Hx      Current medication list and allergy/intolerance information reviewed:   Current Outpatient Prescriptions on File Prior to Visit  Medication Sig Dispense Refill  . desonide (DESOWEN) 0.05 % lotion Apply 1 application topically daily as needed (for skin).    Marland Kitchen dicyclomine (BENTYL) 10 MG capsule Take 1 capsule (10 mg total) by mouth 4 (four) times daily -  before meals and at bedtime. 90 capsule 1  . fluticasone (FLONASE)  50 MCG/ACT nasal spray Place 2 sprays into both nostrils daily as needed for allergies or rhinitis.    Marland Kitchen ipratropium (ATROVENT) 0.06 % nasal spray Place 2 sprays into both nostrils every 4 (four) hours as needed for rhinitis. 10 mL 6  . LORazepam (ATIVAN) 1 MG tablet Take 1 mg by mouth every 8 (eight) hours as needed for anxiety.     . naproxen (NAPROSYN) 500 MG tablet Take 1 tablet (500 mg total) by mouth 2 (two) times daily with a meal. (Patient taking differently: Take 500 mg by mouth 2 (two) times daily as  needed for moderate pain. ) 60 tablet 0  . omeprazole (PRILOSEC) 20 MG capsule Take 1 capsule (20 mg total) by mouth daily. 30 capsule 3  . [START ON 03/15/2016] rivaroxaban (XARELTO) 20 MG TABS tablet Take 1 tablet (20 mg total) by mouth daily with supper. 30 tablet 2  . Rivaroxaban 15 & 20 MG TBPK Take as directed on package: Start with one 15mg  tablet by mouth twice a day with food. On Day 22, switch to one 20mg  tablet once a day with food. 51 each 0  . tiZANidine (ZANAFLEX) 4 MG tablet Take 1 tablet (4 mg total) by mouth every 6 (six) hours as needed for muscle spasms. 30 tablet 0   No current facility-administered medications on file prior to visit.    Allergies  Allergen Reactions  . Dexilant [Dexlansoprazole] Other (See Comments)    Muscle aches  . Ivp Dye [Iodinated Diagnostic Agents] Rash  . Soma [Carisoprodol] Other (See Comments)    Sores on arm  . Sulfamethoxazole Other (See Comments)    REACTION: unspecified      Review of Systems:  Constitutional: No recent illness  Cardiac: No  chest pain, No  pressure, No palpitations  Respiratory:  No  shortness of breath.   Gastrointestinal: No  abdominal pain,  Musculoskeletal: No new myalgia/arthralgia  Skin: No  Rash  Neurologic: No  weakness, No  Dizziness   Exam:  BP 132/81   Pulse 81   Ht 6' (1.829 m)   Wt 177 lb (80.3 kg)   BMI 24.01 kg/m   Constitutional: VS see above. General Appearance: alert, well-developed, well-nourished, NAD  Eyes: Normal lids and conjunctive, non-icteric sclera  Ears, Nose, Mouth, Throat: MMM, Normal external inspection ears/nares/mouth/lips/gums.  Neck: No masses, trachea midline.   Respiratory: Normal respiratory effort. no wheeze, no rhonchi, no rales  Cardiovascular: S1/S2 normal, no murmur, no rub/gallop auscultated. RRR. Calf circumference symmetrical bilaterally. No lower extremity edema.  Musculoskeletal: Gait normal. Symmetric and independent movement of all  extremities  Neurological: Normal balance/coordination. No tremor.  Skin: warm, dry, intact.   Psychiatric: Normal judgment/insight. Normal mood and affect. Oriented x3.     ASSESSMENT/PLAN:   Reassured patient that he is on appropriate treatment for DVT/PE, he is not having concerning symptoms of shortness of breath/chest pain and if he does have these symptoms he knows to go to the hospital. He has a lot of questions about what may have caused the DVT, what he can do to prevent another one, how long he will need to be on medication. This is been discussed with the patient her to this visit as well. With no clear cause, though may have had something to do with an exercise band he was wearing distal to the right knee, no clear-cut cause for this like recent travel or surgery or clotting disorder. I advised patient that he would, per the guidelines,  need to be on anticoagulation indefinitely though we can of course reevaluate this in terms of risks versus benefits and his personal healthcare goals within the next 6-9 months or so. Advise walking as tolerated. Advised acetaminophen for pain. Advised patient if anything worsens or changes, care ASAP but we can expect some persistent dull pain in the distribution of the venous system, and I think that his anxiety about the diagnosis plays a role here. Patient information printed from up-to-date regarding DVT  DVT, lower extremity, distal, acute, right (HCC)  DVT, lower extremity, proximal, acute, right ([place])  Bilateral pulmonary embolism ([place])      Visit summary with medication list and pertinent instructions was printed for patient to review. All questions at time of visit were answered - patient instructed to contact office with any additional concerns. ER/RTC precautions were reviewed with the patient. Follow-up plan: Return if symptoms worsen or fail to improve.  Note: Total time spent 25 minutes, greater than 50% of the visit was spent  face-to-face counseling and coordinating care for the following: The primary encounter diagnosis was DVT, lower extremity, distal, acute, right ([place]). Diagnoses of DVT, lower extremity, proximal, acute, right ([place]) and Bilateral pulmonary embolism ([place]) were also pertinent to this visit.Marland Kitchen

## 2016-03-13 DIAGNOSIS — R310 Gross hematuria: Secondary | ICD-10-CM | POA: Diagnosis not present

## 2016-03-14 ENCOUNTER — Ambulatory Visit (INDEPENDENT_AMBULATORY_CARE_PROVIDER_SITE_OTHER): Payer: Medicare Other | Admitting: Physician Assistant

## 2016-03-14 ENCOUNTER — Encounter: Payer: Self-pay | Admitting: Physician Assistant

## 2016-03-14 VITALS — BP 110/80 | HR 90 | Temp 98.1°F | Ht 72.0 in | Wt 178.0 lb

## 2016-03-14 DIAGNOSIS — R319 Hematuria, unspecified: Secondary | ICD-10-CM

## 2016-03-14 DIAGNOSIS — Z79899 Other long term (current) drug therapy: Secondary | ICD-10-CM

## 2016-03-14 DIAGNOSIS — I2699 Other pulmonary embolism without acute cor pulmonale: Secondary | ICD-10-CM | POA: Diagnosis not present

## 2016-03-14 LAB — POCT URINALYSIS DIPSTICK
Bilirubin, UA: NEGATIVE
GLUCOSE UA: NEGATIVE
Ketones, UA: NEGATIVE
LEUKOCYTES UA: NEGATIVE
NITRITE UA: NEGATIVE
Protein, UA: NEGATIVE
Spec Grav, UA: >=1.03
UROBILINOGEN UA: 0.2
pH, UA: 5.5

## 2016-03-14 MED ORDER — RIVAROXABAN 15 MG PO TABS: 15.0000 mg | ORAL_TABLET | Freq: Every day | ORAL | 0 refills | Status: DC

## 2016-03-14 NOTE — Patient Instructions (Signed)
Go down to 15mg  xarelto.

## 2016-03-14 NOTE — Progress Notes (Addendum)
Subjective:    Patient ID: David Armstrong, male    DOB: 11-03-1950, 65 y.o.   MRN: [id number]  HPI Patient is a 65 yo male coming to the office today complaining of hematuria since he got out of the hospital for a PE at the end of October of this year and was placed on Xarelto 20 MG. Two days ago, he noticed more blood in his urine. He went to [facility] yesterday to follow-up with kidney stones that were found while he was at the hospital. He reports that he is scheduled to go back to [facility] on December 12. Patient also reports left groin pain and left testicular pain. He also reports dysuria and increased frequency. He also reports fatigued. He denies fevers and shortness of breath.   He also complains of pain in his right lower leg in his calf where he had his previous DVT. He rates his pain a 6/10 and describes the pain as achy.   He also complains of right sided chest pain. He describes the pain as dull and achy . He rates the pain a 5/10. Patient denies palpations or leg swelling.    Review of Systems  Constitutional: Positive for fatigue. Negative for appetite change and fever.  Cardiovascular: Positive for chest pain. Negative for palpitations and leg swelling.  Genitourinary: Positive for dysuria, frequency, hematuria and testicular pain. Negative for difficulty urinating.       Objective: Blood pressure 110/80, pulse 90, temperature 98.1 F (36.7 C), height 6' (1.829 m), weight 178 lb (80.7 kg).   Physical Exam  Constitutional: He is oriented to person, place, and time. No distress.  Cardiovascular: Normal rate and regular rhythm.  Exam reveals no gallop and no friction rub.   No murmur heard. Pulmonary/Chest: Effort normal and breath sounds normal. No respiratory distress. He has no wheezes. He has no rales.  Abdominal: Soft. Bowel sounds are normal. He exhibits no distension. There is tenderness (Tenderness to palpation in suprapubic region and left groin.  ). There is guarding (In suprapubic region).  Musculoskeletal: He exhibits tenderness (Tenderness to palpation of gastrocnemius. ). He exhibits no edema or deformity.  Neurological: He is alert and oriented to person, place, and time.   UA- Large about of blood     Assessment & Plan:  David Armstrong was seen today for hematuria.  Diagnoses and all orders for this visit:  Hematuria, unspecified type -  POCT urinalysis dipstick resulted and showed a large amount of blood.  -  Urine Culture due to the results of UA  - Changed patient's dose of Xarelto from 20 MG to 15 MG due to possible hematuria from Xarelto.  - COMPLETE METABOLIC PANEL WITH GFR to check kidney function.  - CBC to monitor hemoglobin and hematocrit  - Call urology on Monday if hematuria worsens over the weekend to try to get an appointment sooner. Called urology to get notes and blood work that was completed at [facility]. Waiting for a call back from the on call provider. Spoke with on call provider at 5:30pm and reported pt never had any blood work done yesterday. I called patient and discussed importance of coming in on Monday to have labs. Pt did report blood seemed to be decreasing in urine.   Bilateral pulmonary embolism (HCC)- Patient currently taking Xarelto 20 MG - CBC to check hemoglobin and hematocrit due to blood loss.  -Changed patient's dose of Xarelto to 15 MG due to possible hematuria from  Xarelto.

## 2016-03-16 LAB — URINE CULTURE: ORGANISM ID, BACTERIA: NO GROWTH

## 2016-03-17 ENCOUNTER — Encounter: Payer: Self-pay | Admitting: Osteopathic Medicine

## 2016-03-17 ENCOUNTER — Telehealth: Payer: Self-pay

## 2016-03-17 DIAGNOSIS — R310 Gross hematuria: Secondary | ICD-10-CM | POA: Insufficient documentation

## 2016-03-17 DIAGNOSIS — I2699 Other pulmonary embolism without acute cor pulmonale: Secondary | ICD-10-CM | POA: Diagnosis not present

## 2016-03-17 DIAGNOSIS — R319 Hematuria, unspecified: Secondary | ICD-10-CM | POA: Diagnosis not present

## 2016-03-17 DIAGNOSIS — Z79899 Other long term (current) drug therapy: Secondary | ICD-10-CM | POA: Diagnosis not present

## 2016-03-17 LAB — CBC
HCT: 43 % (ref 38.5–50.0)
Hemoglobin: 15.1 g/dL (ref 13.2–17.1)
MCH: 31.1 pg (ref 27.0–33.0)
MCHC: 35.1 g/dL (ref 32.0–36.0)
MCV: 88.5 fL (ref 80.0–100.0)
MPV: 9.4 fL (ref 7.5–12.5)
Platelets: 222 10*3/uL (ref 140–400)
RBC: 4.86 MIL/uL (ref 4.20–5.80)
RDW: 14.2 % (ref 11.0–15.0)
WBC: 5.9 10*3/uL (ref 3.8–10.8)

## 2016-03-17 MED ORDER — RIVAROXABAN 15 MG PO TABS: 15.0000 mg | ORAL_TABLET | Freq: Every day | ORAL | 0 refills | Status: DC

## 2016-03-17 NOTE — Telephone Encounter (Signed)
We had changed his xarelto due to possible cause of hematuria to 15mg . That is the last dose I sent.

## 2016-03-18 LAB — COMPLETE METABOLIC PANEL WITH GFR
ALBUMIN: 4.1 g/dL (ref 3.6–5.1)
ALK PHOS: 76 U/L (ref 40–115)
ALT: 14 U/L (ref 9–46)
AST: 17 U/L (ref 10–35)
BILIRUBIN TOTAL: 0.7 mg/dL (ref 0.2–1.2)
BUN: 13 mg/dL (ref 7–25)
CALCIUM: 9.1 mg/dL (ref 8.6–10.3)
CO2: 28 mmol/L (ref 20–31)
CREATININE: 0.81 mg/dL (ref 0.70–1.25)
Chloride: 106 mmol/L (ref 98–110)
Glucose, Bld: 78 mg/dL (ref 65–99)
Potassium: 4.2 mmol/L (ref 3.5–5.3)
Sodium: 142 mmol/L (ref 135–146)
TOTAL PROTEIN: 6.8 g/dL (ref 6.1–8.1)

## 2016-03-18 NOTE — Progress Notes (Signed)
Call pt: kidney function looks great. Keep follow up appt with urology on the 12th for lower abdominal pain.

## 2016-03-25 DIAGNOSIS — R310 Gross hematuria: Secondary | ICD-10-CM | POA: Diagnosis not present

## 2016-03-25 DIAGNOSIS — N200 Calculus of kidney: Secondary | ICD-10-CM | POA: Diagnosis not present

## 2016-04-10 ENCOUNTER — Encounter (HOSPITAL_BASED_OUTPATIENT_CLINIC_OR_DEPARTMENT_OTHER): Payer: Medicare Other

## 2016-04-14 HISTORY — PX: CATARACT EXTRACTION W/ INTRAOCULAR LENS  IMPLANT, BILATERAL: SHX1307

## 2016-04-28 DIAGNOSIS — H2511 Age-related nuclear cataract, right eye: Secondary | ICD-10-CM | POA: Diagnosis not present

## 2016-04-28 DIAGNOSIS — H268 Other specified cataract: Secondary | ICD-10-CM | POA: Diagnosis not present

## 2016-04-28 DIAGNOSIS — H25041 Posterior subcapsular polar age-related cataract, right eye: Secondary | ICD-10-CM | POA: Diagnosis not present

## 2016-04-29 DIAGNOSIS — H2512 Age-related nuclear cataract, left eye: Secondary | ICD-10-CM | POA: Diagnosis not present

## 2016-05-01 ENCOUNTER — Ambulatory Visit: Payer: Medicare Other | Admitting: Osteopathic Medicine

## 2016-05-05 ENCOUNTER — Ambulatory Visit: Payer: Medicare Other | Admitting: Osteopathic Medicine

## 2016-05-12 DIAGNOSIS — H2511 Age-related nuclear cataract, right eye: Secondary | ICD-10-CM | POA: Diagnosis not present

## 2016-05-12 DIAGNOSIS — H2512 Age-related nuclear cataract, left eye: Secondary | ICD-10-CM | POA: Diagnosis not present

## 2016-05-20 DIAGNOSIS — L400 Psoriasis vulgaris: Secondary | ICD-10-CM | POA: Diagnosis not present

## 2016-05-20 DIAGNOSIS — L570 Actinic keratosis: Secondary | ICD-10-CM | POA: Diagnosis not present

## 2016-05-20 DIAGNOSIS — L219 Seborrheic dermatitis, unspecified: Secondary | ICD-10-CM | POA: Diagnosis not present

## 2016-05-21 NOTE — Telephone Encounter (Signed)
Can we call patient - prefer he be on 20 mg Xarelto for DVT treatment, I understand jade reduced dose due to blood in urine. If this has resolved, we should consider increase dose back to 20 mg. I think he also missed an apointment recently, if he is having other concerns/questions, he should schedule appt

## 2016-05-22 ENCOUNTER — Ambulatory Visit (INDEPENDENT_AMBULATORY_CARE_PROVIDER_SITE_OTHER): Payer: Medicare Other | Admitting: Osteopathic Medicine

## 2016-05-22 ENCOUNTER — Encounter: Payer: Self-pay | Admitting: Osteopathic Medicine

## 2016-05-22 VITALS — BP 113/77 | HR 75 | Ht 72.0 in | Wt 178.0 lb

## 2016-05-22 DIAGNOSIS — K219 Gastro-esophageal reflux disease without esophagitis: Secondary | ICD-10-CM

## 2016-05-22 DIAGNOSIS — I82401 Acute embolism and thrombosis of unspecified deep veins of right lower extremity: Secondary | ICD-10-CM

## 2016-05-22 DIAGNOSIS — K589 Irritable bowel syndrome without diarrhea: Secondary | ICD-10-CM

## 2016-05-22 MED ORDER — HYOSCYAMINE SULFATE 0.125 MG PO TBDP: 0.1250 mg | ORAL_TABLET | Freq: Four times a day (QID) | ORAL | 3 refills | Status: DC | PRN

## 2016-05-22 MED ORDER — OMEPRAZOLE 20 MG PO CPDR: 20.0000 mg | DELAYED_RELEASE_CAPSULE | Freq: Every day | ORAL | 3 refills | Status: DC

## 2016-05-22 NOTE — Patient Instructions (Addendum)
Plan:  Continue blood thinners  Will get a referral for the vein doctors - you should get a call about setting up this appointment    If back pain and/or knee pain continue to bother you, please follow-up with Dr. Darene Lamer  We will get records form the [place] - if your primary care person is there, we will defer annual physical/labs to that person

## 2016-05-22 NOTE — Progress Notes (Signed)
HPI: David Armstrong is a 66 y.o. male  who presents to [place] today, 05/22/16,  for chief complaint of:  Chief Complaint  Patient presents with  . Leg Pain   . Leg pain: History of DVT and PE. "I don't feel good." Still having a lot of pain in the RLE where the DVT was.Patient is having some pain in right lower extremity and is very concerned about whether this could be due to DVT versus other musculoskeletal problem as he does have a history of right knee replacement and lumbar disc disease. Describes pain as soreness. No numbness/tingling/shooting pain. Has been going on for a few months. Walking seems to make it worse but sometimes the timing is random. Her shortness of breath or dizziness, no lower extremity swelling.  [facility], they switched to Eliquis. Not sure of the name of his doctor there. Unclear of whether IM patient's PCP or there is a PCP over there. If they are altering medications in this manner, we need records.      Past medical, surgical, social and family history reviewed: Patient Active Problem List   Diagnosis Date Noted  . Gross hematuria 03/17/2016  . Right calf pain   . DVT, lower extremity, proximal, acute, right ([place]) 02/13/2016  . DVT, lower extremity, distal, acute, right ([place]) 02/13/2016  . Bilateral pulmonary embolism ([place]) 02/11/2016  . Pulmonary embolism ([place]) 02/11/2016  . Shortness of breath 02/08/2016  . Hearing loss due to cerumen impaction, right 01/19/2015  . Acute bronchitis 01/15/2015  . Nephrolithiasis 04/19/2014  . Peripheral neuropathy ([place]) 12/15/2013  . Thrombosed external hemorrhoid s/p I&D [id number] 06/09/2012  . 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 IBS (irritable bowel syndrome) 03/09/2012  . Abdominal pain 05/19/2011  . 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 ABDOMINAL PAIN 11/23/2009  . GERD 10/26/2007  . DIVERTICULOSIS, COLON 10/25/2007  . RENAL  CALCULUS 10/25/2007  . HEMORRHOIDS 11/24/2006  . Hyperlipidemia 11/23/2006  . Anxiety state 11/23/2006  . DEPRESSION 11/23/2006  . COLONIC POLYPS, HX OF 11/23/2006   Past Surgical History:  Procedure Laterality Date  . BELPHAROPTOSIS REPAIR Bilateral 01/2015   "droopy eyelids"  . CYSTOSCOPY/RETROGRADE/URETEROSCOPY/STONE EXTRACTION WITH BASKET  1987; 1992; 1999; 2010  . EXCISIONAL HEMORRHOIDECTOMY    . FOOT NEUROMA SURGERY Left 09/2015  . FOOT NEUROMA SURGERY Right 07/2015  . FOOT NEUROMA SURGERY Right 2004   "between big and 2nd toes; shortened my 2nd toe"  . FRACTURE SURGERY    . HAMMER TOE SURGERY Left    "little toe"  . INCISION AND DRAINAGE FOOT Left 1992   "ball of foot; stepped on piece of hard wire & it went thru my shoe"  . KNEE ARTHROSCOPY Bilateral   . LITHOTRIPSY  1992  . NASAL FRACTURE SURGERY  1997  . PARTIAL KNEE ARTHROPLASTY Right 06/2003  . PLANTAR FASCIA RELEASE Bilateral 1987-1993   right-left  . RHINOPLASTY  2000  . SHOULDER ARTHROSCOPY Left 03/2011; 2013  . SHOULDER ARTHROSCOPY Right 2007   "frozen shoulder"  . TOENAIL EXCISION Bilateral 2012   "big toes"   Social History  Substance Use Topics  . Smoking status: Never Smoker  . Smokeless tobacco: Never Used  . Alcohol use No   Family History  Problem Relation Age of Onset  . Heart disease Mother   . Stroke Mother   . Cirrhosis Father   . Heart disease Father   . Skin cancer  Father   . Prostate cancer Maternal Grandfather   . Parkinson's disease Paternal Grandfather   . Heart disease Brother   . Colon cancer Neg Hx      Current medication list and allergy/intolerance information reviewed:   Current Outpatient Prescriptions  Medication Sig Dispense Refill  . desonide (DESOWEN) 0.05 % lotion Apply 1 application topically daily as needed (for skin).    Marland Kitchen dicyclomine (BENTYL) 10 MG capsule Take 1 capsule (10 mg total) by mouth 4 (four) times daily -  before meals and at bedtime. 90 capsule 1   . fluticasone (FLONASE) 50 MCG/ACT nasal spray Place 2 sprays into both nostrils daily as needed for allergies or rhinitis.    Marland Kitchen ipratropium (ATROVENT) 0.06 % nasal spray Place 2 sprays into both nostrils every 4 (four) hours as needed for rhinitis. 10 mL 6  . LORazepam (ATIVAN) 1 MG tablet Take 1 mg by mouth every 8 (eight) hours as needed for anxiety.     . naproxen (NAPROSYN) 500 MG tablet Take 1 tablet (500 mg total) by mouth 2 (two) times daily with a meal. (Patient taking differently: Take 500 mg by mouth 2 (two) times daily as needed for moderate pain. ) 60 tablet 0  . omeprazole (PRILOSEC) 20 MG capsule Take 1 capsule (20 mg total) by mouth daily. 30 capsule 3  . Rivaroxaban (XARELTO) 15 MG TABS tablet Take 1 tablet (15 mg total) by mouth daily with supper. 30 tablet 0  . tiZANidine (ZANAFLEX) 4 MG tablet Take 1 tablet (4 mg total) by mouth every 6 (six) hours as needed for muscle spasms. 30 tablet 0   No current facility-administered medications for this visit.    Allergies  Allergen Reactions  . Dexilant [Dexlansoprazole] Other (See Comments)    Muscle aches  . Ivp Dye [Iodinated Diagnostic Agents] Rash  . Soma [Carisoprodol] Other (See Comments)    Sores on arm  . Sulfamethoxazole Other (See Comments)    REACTION: unspecified      Review of Systems:  Constitutional:  No  fever, no chills, No recent illness  HEENT: No  headache,  Cardiac: No  chest pain, No  pressure, No palpitations, No  Orthopnea  Respiratory:  No  shortness of breath. No  Cough  Gastrointestinal: No  abdominal pain  Musculoskeletal: No new myalgia/arthralgia  Skin: No  Rash, No other wounds/concerning lesions  Neurologic: No  weakness, No  dizziness  Exam:  BP 113/77   Pulse 75   Ht 6' (1.829 m)   Wt 178 lb (80.7 kg)   BMI 24.14 kg/m   Constitutional: VS see above. General Appearance: alert, well-developed, well-nourished, NAD  Ears, Nose, Mouth, Throat: MMM, Normal external inspection  ears/nares/mouth/lips/gums.   Neck: No masses, trachea midline.   Respiratory: Normal respiratory effort. no wheeze, no rhonchi, no rales  Cardiovascular: S1/S2 normal, no murmur, no rub/gallop auscultated. RRR. No lower extremity edema. Pedal pulse II/IV bilaterally DP and PT. Homans sign negative bilaterally.  Gastrointestinal: Nontender, no masses.   Musculoskeletal: Gait normal. No clubbing/cyanosis of digits. Straight leg raise on right side elicits lower back pain but no radiculopathy. Log roll test negative for hip pain.   Neurological: Normal balance/coordination. No tremor.   Skin: warm, dry, intact.   Psychiatric: Normal judgment/insight. Normal mood and affect. Oriented x3.    ASSESSMENT/PLAN:   Patient has a lot of long-standing and repeated concerns about whether leg pain can be due to DVT, he is adequately anticoagulated, though we need  records from the [place] if they are going to be altering medications such as anticoagulation. Patient needs to clarify if I or if his doctor at the [place] is his PCP. Will request records.  Will request second opinion from vascular. Patient has some questions about IVC filter placement, whether any procedure can be done to alleviate this pain discomfort. Will defer to their judgment for further workup/intervention. Patient is advised that due to unprovoked nature of DVT/PE he will likely need to be on anticoagulation forever. This has been explained to him on multiple occasions, though of course he has the right to decline anticoagulant therapy in consideration of risks versus benefits, though I would not advise this without consultation from vascular or hematology.  Deep vein thrombosis (DVT) of right lower extremity, unspecified chronicity, unspecified vein (HCC) - Plan: Ambulatory referral to Vascular Surgery  Gastroesophageal reflux disease, esophagitis presence not specified - Plan: omeprazole (PRILOSEC) 20 MG capsule  Irritable bowel syndrome,  unspecified type    Patient Instructions  Plan:  Continue blood thinners  Will get a referral for the vein doctors - you should get a call about setting up this appointment    If back pain and/or knee pain continue to bother you, please follow-up with Dr. Darene Lamer  We will get records form the [place] - if your primary care person is there, we will defer annual physical/labs to that person    Visit summary with medication list and pertinent instructions was printed for patient to review. All questions at time of visit were answered - patient instructed to contact office with any additional concerns. ER/RTC precautions were reviewed with the patient. Follow-up plan: Return in about 6 months (around 11/19/2016) for ANNUAL PHYSICAL, SOONER IF NEEDED .  Note: Total time spent 40 minutes, greater than 50% of the visit was spent face-to-face counseling and coordinating care for the following: The primary encounter diagnosis was Deep vein thrombosis (DVT) of right lower extremity, unspecified chronicity, unspecified vein ([place]). Diagnoses of Gastroesophageal reflux disease, esophagitis presence not specified and Irritable bowel syndrome, unspecified type were also pertinent to this visit.Marland Kitchen

## 2016-05-23 MED ORDER — XARELTO 20 MG PO TABS
20.0000 mg | ORAL_TABLET | Freq: Every day | ORAL | 0 refills | Status: DC
Start: 2016-05-23 — End: 2016-05-26

## 2016-05-23 NOTE — Addendum Note (Signed)
Addended by: Doree Albee on: 05/23/2016 01:58 PM   Modules accepted: Orders

## 2016-05-23 NOTE — Telephone Encounter (Signed)
Patient spoek to patient gave heim information as noted below. Patient stated that he will go up to the 20 mg. Rx has been sent to pharmacy. Caris Cerveny,CMA

## 2016-05-26 ENCOUNTER — Telehealth: Payer: Self-pay

## 2016-05-26 MED ORDER — XARELTO 20 MG PO TABS: 20.0000 mg | ORAL_TABLET | Freq: Every day | ORAL | 0 refills | Status: DC

## 2016-05-26 NOTE — Telephone Encounter (Signed)
Patient also request a letter saying why he is to be on the Xarelto instead of the Eliquis. Please advise as patient will be coming to pick up Rx this week. Patient called requested a written script for Xarelto to take to [facility] on the 21st. Rx has been printed and placed in provider basket for signature and patient will be notified that it is ready for pickup. Estaban [place],CMA

## 2016-05-26 NOTE — Telephone Encounter (Signed)
When I saw this patient recently, he stated that he had been taken off of the Xarelto by the [place] and placed on Eliquis, and he was going to continue with this medication (continue Eliquis). I do not currently have records from the [place]. Patient was asked to make a decision about who is going to be his PCP/manage medications. He should not be taking both of these medicines - please confirm that he is not on both. Please confirm with the patient is actually taking and we need to get records from the [place], he should have signed a release at his last visit.

## 2016-05-28 ENCOUNTER — Telehealth: Payer: Self-pay

## 2016-05-28 MED ORDER — XARELTO 20 MG PO TABS: 20.0000 mg | ORAL_TABLET | Freq: Every day | ORAL | 0 refills | Status: DC

## 2016-05-28 NOTE — Telephone Encounter (Signed)
Reprinted Rx for Xarelto 20 mg. Rhonda Cunningham,CMA

## 2016-05-29 NOTE — Addendum Note (Signed)
Addended by: Doree Albee on: 05/29/2016 01:07 PM   Modules accepted: Orders

## 2016-05-29 NOTE — Telephone Encounter (Signed)
Patient informed and stated that he is not taking both the Eliquis and the Xarelto. Rhonda Cunningham,CMA

## 2016-06-04 ENCOUNTER — Encounter: Payer: Self-pay | Admitting: Osteopathic Medicine

## 2016-06-04 DIAGNOSIS — Z7901 Long term (current) use of anticoagulants: Secondary | ICD-10-CM | POA: Insufficient documentation

## 2016-06-05 DIAGNOSIS — M62838 Other muscle spasm: Secondary | ICD-10-CM | POA: Diagnosis not present

## 2016-06-05 DIAGNOSIS — M9905 Segmental and somatic dysfunction of pelvic region: Secondary | ICD-10-CM | POA: Diagnosis not present

## 2016-06-05 DIAGNOSIS — M9902 Segmental and somatic dysfunction of thoracic region: Secondary | ICD-10-CM | POA: Diagnosis not present

## 2016-06-05 DIAGNOSIS — M9903 Segmental and somatic dysfunction of lumbar region: Secondary | ICD-10-CM | POA: Diagnosis not present

## 2016-06-05 DIAGNOSIS — M9901 Segmental and somatic dysfunction of cervical region: Secondary | ICD-10-CM | POA: Diagnosis not present

## 2016-06-05 DIAGNOSIS — M25562 Pain in left knee: Secondary | ICD-10-CM | POA: Diagnosis not present

## 2016-06-05 DIAGNOSIS — M9904 Segmental and somatic dysfunction of sacral region: Secondary | ICD-10-CM | POA: Diagnosis not present

## 2016-06-06 ENCOUNTER — Encounter: Payer: Self-pay | Admitting: Vascular Surgery

## 2016-06-13 ENCOUNTER — Encounter: Payer: Self-pay | Admitting: Vascular Surgery

## 2016-06-13 ENCOUNTER — Ambulatory Visit (INDEPENDENT_AMBULATORY_CARE_PROVIDER_SITE_OTHER): Payer: Medicare Other | Admitting: Vascular Surgery

## 2016-06-13 VITALS — BP 114/82 | HR 76 | Temp 97.4°F | Resp 16 | Ht 72.0 in | Wt 176.0 lb

## 2016-06-13 DIAGNOSIS — I82411 Acute embolism and thrombosis of right femoral vein: Secondary | ICD-10-CM | POA: Diagnosis not present

## 2016-06-13 NOTE — Progress Notes (Signed)
Patient ID: David Armstrong, male   DOB: 08-01-1950, 66 y.o.   MRN: [id number]  Reason for Consult: New Evaluation (pain right leg)   Referred by Emeterio Reeve, DO  Subjective:     HPI:  David Armstrong is a 66 y.o. male presents for evaluation of right lower extremity pain. He is a mostly healthy gentleman who in October had extensive right lower extremity DVT from his femoral vein all the way through his posterior tibial vein. At the same time he also had bilateral pulmonary emboli. He is now referred for persistent right posterior thigh pain. He Did not tell me the time when this started possibly he did have some issues prior to his DVT. At the time of his DVT he did not have any illnesses had not had recent surgery was otherwise in his usual state of health. He does not have a prior history of DVT before October and does not have any known family history. As far as his pain is in his posterior thigh is exacerbated with any touching does limit his walking ability. He is not found alleviating factors. It is very sharp pain and focal and does not radiate down his leg. He is currently on Xarelto for at least a 6 month time period.  Past Medical History:  Diagnosis Date  . Anal fissure   . ANXIETY   . Arthritis    "lower back; hips;' right knee" (02/11/2016)  . Bilateral pulmonary embolism ([place]) 02/11/2016  . Bipolar disorder ([place])   . Chronic constipation   . Chronic lower back pain   . DEPRESSION   . DIVERTICULOSIS, COLON [id number]   Colonoscopy  . DVT (deep venous thrombosis) ([place]) 02/11/2016   RLE  . Fatty liver   . GERD   . H. pylori infection   . Hemorrhoids [id number]   Colonoscopy   . Hiatal hernia [id number]   EGD  . History of kidney stones   . HYPERLIPIDEMIA   . IBS (irritable bowel syndrome)   . Internal hemorrhoids   . Peripheral neuropathy ([place])   . Personal history of colonic polyps 10/27/2007   HYPERPLASTIC POLYP  . Pneumonia 12/2014  . TMJ  (temporomandibular joint syndrome)   . Unspecified gastritis and gastroduodenitis without mention of hemorrhage 2009   EGD   Family History  Problem Relation Age of Onset  . Heart disease Mother   . Stroke Mother   . Cirrhosis Father   . Heart disease Father   . Skin cancer Father   . Prostate cancer Maternal Grandfather   . Parkinson's disease Paternal Grandfather   . Heart disease Brother   . Colon cancer Neg Hx    Past Surgical History:  Procedure Laterality Date  . BELPHAROPTOSIS REPAIR Bilateral 01/2015   "droopy eyelids"  . CYSTOSCOPY/RETROGRADE/URETEROSCOPY/STONE EXTRACTION WITH BASKET  1987; 1992; 1999; 2010  . EXCISIONAL HEMORRHOIDECTOMY    . FOOT NEUROMA SURGERY Left 09/2015  . FOOT NEUROMA SURGERY Right 07/2015  . FOOT NEUROMA SURGERY Right 2004   "between big and 2nd toes; shortened my 2nd toe"  . FRACTURE SURGERY    . HAMMER TOE SURGERY Left    "little toe"  . INCISION AND DRAINAGE FOOT Left 1992   "ball of foot; stepped on piece of hard wire & it went thru my shoe"  . KNEE ARTHROSCOPY Bilateral   . LITHOTRIPSY  1992  . NASAL FRACTURE SURGERY  1997  . PARTIAL KNEE ARTHROPLASTY Right 06/2003  . PLANTAR FASCIA RELEASE  Bilateral 1987-1993   right-left  . RHINOPLASTY  2000  . SHOULDER ARTHROSCOPY Left 03/2011; 2013  . SHOULDER ARTHROSCOPY Right 2007   "frozen shoulder"  . TOENAIL EXCISION Bilateral 2012   "big toes"    Short Social History:  Social History  Substance Use Topics  . Smoking status: Never Smoker  . Smokeless tobacco: Never Used  . Alcohol use No    Allergies  Allergen Reactions  . Dexilant [Dexlansoprazole] Other (See Comments)    Muscle aches  . Ivp Dye [Iodinated Diagnostic Agents] Rash  . Soma [Carisoprodol] Other (See Comments)    Sores on arm  . Sulfamethoxazole Other (See Comments)    REACTION: unspecified    Current Outpatient Prescriptions  Medication Sig Dispense Refill  . LORazepam (ATIVAN) 1 MG tablet Take 1 mg by  mouth every 8 (eight) hours as needed for anxiety.     Alveda Reasons 20 MG TABS tablet Take 1 tablet (20 mg total) by mouth daily. 30 tablet 0  . desonide (DESOWEN) 0.05 % lotion Apply 1 application topically daily as needed (for skin).    Marland Kitchen dicyclomine (BENTYL) 10 MG capsule Take 1 capsule (10 mg total) by mouth 4 (four) times daily -  before meals and at bedtime. (Patient not taking: Reported on 06/13/2016) 90 capsule 1  . fluticasone (FLONASE) 50 MCG/ACT nasal spray Place 2 sprays into both nostrils daily as needed for allergies or rhinitis.    . hyoscyamine (ANASPAZ) 0.125 MG TBDP disintergrating tablet Place 1 tablet (0.125 mg total) under the tongue every 6 (six) hours as needed. (Patient not taking: Reported on 06/13/2016) 30 tablet 3  . ipratropium (ATROVENT) 0.06 % nasal spray Place 2 sprays into both nostrils every 4 (four) hours as needed for rhinitis. (Patient not taking: Reported on 06/13/2016) 10 mL 6  . naproxen (NAPROSYN) 500 MG tablet Take 1 tablet (500 mg total) by mouth 2 (two) times daily with a meal. (Patient not taking: Reported on 06/13/2016) 60 tablet 0  . omeprazole (PRILOSEC) 20 MG capsule Take 1 capsule (20 mg total) by mouth daily. (Patient not taking: Reported on 06/13/2016) 30 capsule 3  . tiZANidine (ZANAFLEX) 4 MG tablet Take 1 tablet (4 mg total) by mouth every 6 (six) hours as needed for muscle spasms. (Patient not taking: Reported on 06/13/2016) 30 tablet 0   No current facility-administered medications for this visit.     Review of Systems  Constitutional:  Constitutional negative. Eyes: Eyes negative.  Respiratory: Respiratory negative.  Cardiovascular: Cardiovascular negative.  GI: Gastrointestinal negative.  Musculoskeletal: Positive for leg pain, joint pain and myalgias.  Skin: Skin negative.  Neurological: Neurological negative. Hematologic: Hematologic/lymphatic negative.  Psychiatric: Psychiatric negative.        Objective:  Objective   Vitals:   06/13/16  0941  BP: 114/82  Pulse: 76  Resp: 16  Temp: 97.4 F (36.3 C)  SpO2: 100%  Weight: 176 lb (79.8 kg)  Height: 6' (1.829 m)   Body mass index is 23.87 kg/m.  Physical Exam  Constitutional: He is oriented to person, place, and time. He appears well-developed and well-nourished.  HENT:  Head: Normocephalic.  Eyes: Pupils are equal, round, and reactive to light.  Neck: Normal range of motion.  Cardiovascular: Normal rate.   Pulses:      Carotid pulses are 2+ on the right side, and 2+ on the left side.      Radial pulses are 2+ on the right side, and 2+ on the left  side.       Femoral pulses are 2+ on the right side, and 2+ on the left side.      Popliteal pulses are 2+ on the right side, and 2+ on the left side.       Dorsalis pedis pulses are 2+ on the right side, and 2+ on the left side.       Posterior tibial pulses are 2+ on the right side, and 2+ on the left side.  Pulmonary/Chest: Effort normal. No respiratory distress.  Abdominal: Soft.  Musculoskeletal: Normal range of motion.  Neurological: He is alert and oriented to person, place, and time.  Skin: Skin is warm and dry. No erythema.  Psychiatric: He has a normal mood and affect. His behavior is normal. Judgment and thought content normal.    Data: Right lower extremity duplex reviewed with extensive acute appearing DVT that the right lower extremity from femoral to posterior tibial vein from October 2017.  Villalta score 4     Assessment/Plan:     This is a 66 year old male presenting with history of extensive right lower extremity DVT with PE. He is now maintained on Xarelto since beginning of November. He has a very low Villalta score at 4 which gives him no posttraumatic syndrome although he does have right posterior thigh pain which I think is likely Multivite factorial. We do not know the etiology of his DVT and there is some concern that he could have a compressive syndrome in his IVC and iliac veins. Because of  this we will pursue duplex of those veins as well as his reflux study in his right lower extremity. He merits at least 6 months of anticoagulation and possibly consideration for longer up to 1 year. I told him that when he travels he needs to stay very hydrated and walk at intermittent time periods. Should we not find any compressive issues there will be no intervention from a vascular standpoint. I'll plan to see him in 3 months if he does not need a sitter.     Waynetta Sandy MD [facility]

## 2016-06-17 ENCOUNTER — Ambulatory Visit (HOSPITAL_BASED_OUTPATIENT_CLINIC_OR_DEPARTMENT_OTHER): Payer: Medicare Other

## 2016-06-20 ENCOUNTER — Telehealth: Payer: Self-pay | Admitting: Internal Medicine

## 2016-06-20 NOTE — Addendum Note (Signed)
Addended by: Lianne Cure A on: 06/20/2016 09:41 AM   Modules accepted: Orders

## 2016-06-20 NOTE — Telephone Encounter (Signed)
Pt states he is having problems with anal itching. Requests script for nitroglycerin cream, states Dr. Sharlett Iles treated him with this in the past. Pt has not been seen in several years. Pt scheduled to see Tye Savoy NP 06/26/16@3pm . Pt aware of appt.

## 2016-06-26 ENCOUNTER — Ambulatory Visit (INDEPENDENT_AMBULATORY_CARE_PROVIDER_SITE_OTHER): Payer: Medicare Other | Admitting: Nurse Practitioner

## 2016-06-26 ENCOUNTER — Encounter: Payer: Self-pay | Admitting: Nurse Practitioner

## 2016-06-26 VITALS — BP 110/70 | HR 98 | Ht 71.0 in | Wt 180.0 lb

## 2016-06-26 DIAGNOSIS — K602 Anal fissure, unspecified: Secondary | ICD-10-CM | POA: Diagnosis not present

## 2016-06-26 MED ORDER — AMBULATORY NON FORMULARY MEDICATION: 0 refills | Status: DC

## 2016-06-26 NOTE — Progress Notes (Addendum)
HPI: Patient is a 66 year old male followed by previously by Dr. Sharlett Iles, now by Dr. Hilarie Fredrickson. He has chronic constipation refractory of Linzess. He changed him to [place] in June. Med was approved by insurance but he no longer takes it for unclear reasons. He uses Dulcolax supp as needed. He is here with anorectal burning and bleeding. Screening colonoscopy in 2014 was unremarkable except for diverticulosis. It was mentioned at that time it patient had idiopathic anal pain. We have seen the patient a few times for constipation and perianal itching since his last colonoscopy. He has been treated with steroid supp and recticare. Remotely someone treated him with nitroglycerin for anal fissure and this brought him the most relief.    Past Medical History:  Diagnosis Date  . Anal fissure   . ANXIETY   . Arthritis    "lower back; hips;' right knee" (02/11/2016)  . Bilateral pulmonary embolism ([place]) 02/11/2016  . Bipolar disorder ([place])   . Chronic constipation   . Chronic lower back pain   . DEPRESSION   . DIVERTICULOSIS, COLON [phone]   Colonoscopy  . DVT (deep venous thrombosis) ([place]) 02/11/2016   RLE  . Fatty liver   . GERD   . H. pylori infection   . Hemorrhoids [id number]   Colonoscopy   . Hiatal hernia [id number]   EGD  . History of kidney stones   . HYPERLIPIDEMIA   . IBS (irritable bowel syndrome)   . Internal hemorrhoids   . Peripheral neuropathy ([place])   . Personal history of colonic polyps 10/27/2007   HYPERPLASTIC POLYP  . Pneumonia 12/2014  . TMJ (temporomandibular joint syndrome)   . Unspecified gastritis and gastroduodenitis without mention of hemorrhage 2009   EGD    Patient's surgical history, family medical history, social history, medications and allergies were all reviewed in Epic    Physical Exam: BP 110/70   Pulse 98   Ht 5\' 11"  (1.803 m)   Wt 180 lb (81.6 kg)   BMI 25.10 kg/m   GENERAL: well developed white male in NAD PSYCH: :Pleasant,  cooperative, normal affect HEENT: Normocephalic, conjunctiva pink, mucous membranes moist, neck supple without masses ABDOMEN:  soft, nontender, nondistended, no obvious masses,  normal bowel sounds Rectal: very superficial linear excoriation at 6 o'clock in left decubitus position. Starts a few millimeters away from anus so not really anal fissure. Just inside anal verge there is a small thrombosed hemorrhoid at about 5 o'clock. Significant discomfort in posterior midline area when attempting DRE so attempt aborted.  SKIN:  turgor, no lesions seen Musculoskeletal:  Normal muscle tone, normal strength NEURO: Alert and oriented x 3, no focal neurologic deficits   ASSESSMENT and PLAN:  29. 66 year old with recurrent perianal burning, itching and now minor rectal bleeding. Previously pain and itching felt to be related to internal hemorrhoids and treatment has consisted of steroid preparations, Recti-Care and management of constipation. Sounds like he has a remote history of anal fissures successfully treated with NTG while in [place]. Limited DRE today due to discomfort. I was able to see a small thrombosed hemorrhoid just inside anus.  Suspect pain, bleeding and itching are from hemorrhoids as well as a posterior midline fissure based on my exam.  -Trial of NTG ointment for 6 weeks for probably fissure. Avoid straining. -sitz baths TID - Return to clinic in 4 weeks. Will treat hemorrhoids once fissure healed as steroids may impede healing of fissure.     2. Hx  of PE, Since we last saw Mr. Suess he has been hospitalized with a massive PE and is on Xarelto now.    Tye Savoy , NP 06/26/2016, 3:52 PM   Addendum: Reviewed and agree with initial management. Jerene Bears, MD

## 2016-06-26 NOTE — Patient Instructions (Addendum)
We have given you a printed prescription of Nitroglycerin Gel to take to [facility] to be compounded  Do SITZ baths three times a day  Avoid Straining   Your follow up appointment is scheduled on 08/04/2016 at 11am

## 2016-07-03 DIAGNOSIS — H01009 Unspecified blepharitis unspecified eye, unspecified eyelid: Secondary | ICD-10-CM | POA: Diagnosis not present

## 2016-07-16 ENCOUNTER — Telehealth: Payer: Self-pay | Admitting: Nurse Practitioner

## 2016-07-16 NOTE — Telephone Encounter (Signed)
The pt has been scheduled for follow up with Nevin Bloodgood on 07/17/16 3 pm.  Pt aware and will keep appt for continued rectal itching

## 2016-07-17 ENCOUNTER — Encounter (INDEPENDENT_AMBULATORY_CARE_PROVIDER_SITE_OTHER): Payer: Self-pay

## 2016-07-17 ENCOUNTER — Ambulatory Visit (INDEPENDENT_AMBULATORY_CARE_PROVIDER_SITE_OTHER): Payer: Medicare Other | Admitting: Nurse Practitioner

## 2016-07-17 ENCOUNTER — Encounter: Payer: Self-pay | Admitting: Nurse Practitioner

## 2016-07-17 VITALS — BP 96/60 | HR 92 | Ht 70.25 in | Wt 178.0 lb

## 2016-07-17 DIAGNOSIS — G8929 Other chronic pain: Secondary | ICD-10-CM | POA: Diagnosis not present

## 2016-07-17 DIAGNOSIS — K6289 Other specified diseases of anus and rectum: Secondary | ICD-10-CM | POA: Diagnosis not present

## 2016-07-17 DIAGNOSIS — K5909 Other constipation: Secondary | ICD-10-CM | POA: Diagnosis not present

## 2016-07-17 DIAGNOSIS — K648 Other hemorrhoids: Secondary | ICD-10-CM

## 2016-07-17 MED ORDER — LUBIPROSTONE 24 MCG PO CAPS
24.0000 ug | ORAL_CAPSULE | Freq: Two times a day (BID) | ORAL | 3 refills | Status: DC
Start: 2016-07-17 — End: 2016-09-12

## 2016-07-17 MED ORDER — HYDROCORTISONE 2.5 % RE CREA: 1.0000 "application " | TOPICAL_CREAM | Freq: Every day | RECTAL | 1 refills | Status: DC

## 2016-07-17 NOTE — Progress Notes (Addendum)
     HPI: Patient is a 66 yo male with chronic anal pain ( ? Fissures) and chronic constipation. He had a screening colonoscopy in 2014 with findings of mild sigmoid diverticulosis. I saw him mid March for evaluation of perianal burning / itching and rectal bleeding. Rectal exam was limited by discomfort but a small thrombosed hemorrhoid was seen just inside the anus.I couldn't appreciate a fissure on exam but patient was exquisitely tender.   Patient had used NTG ointment in the past and per his request I refilled it for him. I  recommended sitz baths and follow-up in a few weeks. His anorectal pain and bleeding have improved.  He continues to have perianal itching. Still strains to have BMs. Patient cannot remember why he ever stop taking the Linzess or Amitiza but thinks one of both of the meds did help his constipation.    Past Medical History:  Diagnosis Date  . Anal fissure   . ANXIETY   . Arthritis    "lower back; hips;' right knee" (02/11/2016)  . Bilateral pulmonary embolism ([place]) 02/11/2016  . Bipolar disorder ([place])   . Chronic constipation   . Chronic lower back pain   . DEPRESSION   . DIVERTICULOSIS, COLON [phone]   Colonoscopy  . DVT (deep venous thrombosis) ([place]) 02/11/2016   RLE  . Fatty liver   . GERD   . H. pylori infection   . Hemorrhoids [id number]   Colonoscopy   . Hiatal hernia [id number]   EGD  . History of kidney stones   . HYPERLIPIDEMIA   . IBS (irritable bowel syndrome)   . Internal hemorrhoids   . Peripheral neuropathy ([place])   . Personal history of colonic polyps 10/27/2007   HYPERPLASTIC POLYP  . Pneumonia 12/2014  . TMJ (temporomandibular joint syndrome)   . Unspecified gastritis and gastroduodenitis without mention of hemorrhage 2009   EGD    Patient's surgical history, family medical history, social history, medications and allergies were all reviewed in Epic    Physical Exam: BP 96/60 (BP Location: Left Arm, Patient Position: Sitting,  Cuff Size: Normal)   Pulse 92   Ht 5' 10.25" (1.784 m)   Wt 178 lb (80.7 kg)   BMI 25.36 kg/m   GENERAL: white male in NAD PSYCH: :Pleasant, cooperative, normal affect CARDIAC:  RRR,  No peripheral edema PULM: Normal respiratory effort Rectal: No obvious fissures or hemorrhoids. Anoscopy revealed internal hemorrhoids. NEURO: Alert and oriented x 3, no focal neurologic deficits   ASSESSMENT and PLAN:  66 year old male with chronic constipation, chronic idiopathic anorectal pain and internal hemorrhoids. I saw him a few weeks ago for recurrent anorectal pain and bleeding. No obvious fissures but I suspected one based on his degree of pain, especially with attempted DRE. Prescribed NTG ointment and pain has improved. I was able to do an anoscopy today, internal hemorrhoids founds. Recent bleeding probably from internal hemorrhoids, +/- anal fissure.  - No obvious fissure but asked him to complete the prescribed course of NTG ointment in case fissure was overlooked.  - Will proceed with treatment of internal hemorrhoids with steroid preparation.   -avoid straining. Patient cannot remember why Linzess or Amitiza was stopped. Will try Linzess again.    Tye Savoy , NP 07/17/2016, 3:26 PM  Addendum: Reviewed and agree with initial management. Jerene Bears, MD

## 2016-07-17 NOTE — Patient Instructions (Signed)
We will send in your prescriptions to your pharmacy  Complete the Diltiazem Gel

## 2016-07-29 ENCOUNTER — Ambulatory Visit (INDEPENDENT_AMBULATORY_CARE_PROVIDER_SITE_OTHER): Payer: Medicare Other | Admitting: Osteopathic Medicine

## 2016-07-29 ENCOUNTER — Encounter: Payer: Self-pay | Admitting: Osteopathic Medicine

## 2016-07-29 VITALS — BP 127/78 | HR 78 | Wt 178.0 lb

## 2016-07-29 DIAGNOSIS — M79604 Pain in right leg: Secondary | ICD-10-CM

## 2016-07-29 DIAGNOSIS — G8929 Other chronic pain: Secondary | ICD-10-CM

## 2016-07-29 DIAGNOSIS — M545 Low back pain: Secondary | ICD-10-CM

## 2016-07-29 DIAGNOSIS — Z86718 Personal history of other venous thrombosis and embolism: Secondary | ICD-10-CM

## 2016-07-29 MED ORDER — TRAMADOL HCL 50 MG PO TABS: 50.0000 mg | ORAL_TABLET | Freq: Two times a day (BID) | ORAL | 0 refills | Status: DC | PRN

## 2016-07-29 MED ORDER — DULOXETINE HCL 20 MG PO CPEP: 20.0000 mg | ORAL_CAPSULE | Freq: Every day | ORAL | 1 refills | Status: DC

## 2016-07-29 NOTE — Patient Instructions (Addendum)
Plan:  1. Will see if Dr. Donzetta Matters would like to schedule you for an ultrasound to look at the veins - I'll get back to you or his office will call you for imaging   2. I would like to try a medication for the pain but we need to avoid antiinflammatories since you're on a blood thinner (to prevent bleeding), and avoid starting an anti-depressant/anti-pain medication without first consulting with your psychiatrist (I don't want to make the bipolar issue worse). Let's get some records from your psychiatrist and I can talk to her about starting a medication if it's ok with her.   3. Would try a follow-up visit with Dr. Darene Lamer to give [place] a second opinion on the possible musculoskeletal component of this pain (back pain, knee pain).   4. In the meantime, prescription written for Tramadol to take as needed for SEVERE pain - caution as this can cause tolerance/dependence. DO NOT take this medicine with the Ativan.

## 2016-07-29 NOTE — Progress Notes (Signed)
HPI: David Armstrong is a 66 y.o. male  who presents to [facility] today, 07/29/16,  for chief complaint of:  Chief Complaint  Patient presents with  . Leg Pain    RIGHT    Patient is still having right lower extremity pain along the groin/back of the leg consistent with distribution of venous system involved in DVT the patient also has history of low back pain.  Per Dr. Donzetta Matters (Vasc/Vein) will pursue duplex of those veins as well as his reflux study in his right lower extremity. These orders are in place for the next few months.  Patient is particularly anxious about whether clot may still be present, requests ultrasound be done sooner if possible.   Past medical, surgical, social and family history reviewed: Patient Active Problem List   Diagnosis Date Noted  . Chronic anticoagulation 06/04/2016  . Gross hematuria 03/17/2016  . Right calf pain   . DVT, lower extremity, proximal, acute, right ([place]) 02/13/2016  . DVT, lower extremity, distal, acute, right ([place]) 02/13/2016  . Bilateral pulmonary embolism ([place]) 02/11/2016  . Pulmonary embolism ([place]) 02/11/2016  . Shortness of breath 02/08/2016  . Hearing loss due to cerumen impaction, right 01/19/2015  . Acute bronchitis 01/15/2015  . Nephrolithiasis 04/19/2014  . Peripheral neuropathy ([place]) 12/15/2013  . Thrombosed external hemorrhoid s/p I&D [id number] 06/09/2012  . 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 IBS (irritable bowel syndrome) 03/09/2012  . Abdominal pain 05/19/2011  . 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 ABDOMINAL PAIN 11/23/2009  . GERD 10/26/2007  . DIVERTICULOSIS, COLON 10/25/2007  . RENAL CALCULUS 10/25/2007  . HEMORRHOIDS 11/24/2006  . Hyperlipidemia 11/23/2006  . Anxiety state 11/23/2006  . DEPRESSION 11/23/2006  . COLONIC POLYPS, HX OF 11/23/2006   Past Surgical History:  Procedure Laterality Date  . BELPHAROPTOSIS REPAIR Bilateral 01/2015   "droopy eyelids"  .  CYSTOSCOPY/RETROGRADE/URETEROSCOPY/STONE EXTRACTION WITH BASKET  1987; 1992; 1999; 2010  . EXCISIONAL HEMORRHOIDECTOMY    . FOOT NEUROMA SURGERY Left 09/2015  . FOOT NEUROMA SURGERY Right 07/2015  . FOOT NEUROMA SURGERY Right 2004   "between big and 2nd toes; shortened my 2nd toe"  . FRACTURE SURGERY    . HAMMER TOE SURGERY Left    "little toe"  . INCISION AND DRAINAGE FOOT Left 1992   "ball of foot; stepped on piece of hard wire & it went thru my shoe"  . KNEE ARTHROSCOPY Bilateral   . LITHOTRIPSY  1992  . NASAL FRACTURE SURGERY  1997  . PARTIAL KNEE ARTHROPLASTY Right 06/2003  . PLANTAR FASCIA RELEASE Bilateral 1987-1993   right-left  . RHINOPLASTY  2000  . SHOULDER ARTHROSCOPY Left 03/2011; 2013  . SHOULDER ARTHROSCOPY Right 2007   "frozen shoulder"  . TOENAIL EXCISION Bilateral 2012   "big toes"   Social History  Substance Use Topics  . Smoking status: Never Smoker  . Smokeless tobacco: Never Used  . Alcohol use No   Family History  Problem Relation Age of Onset  . Heart disease Mother   . Stroke Mother   . Cirrhosis Father   . Heart disease Father   . Skin cancer Father   . Prostate cancer Maternal Grandfather   . Parkinson's disease Paternal Grandfather   . Heart disease Brother   . Colon cancer Neg Hx   . Stomach cancer Neg Hx      Current medication list and allergy/intolerance information reviewed:   Current Outpatient Prescriptions  Medication Sig Dispense Refill  .  AMBULATORY NON FORMULARY MEDICATION Medication Name: Nitroglycerin Gel .0125% use rectally three times a day 30 g 0  . desonide (DESOWEN) 0.05 % lotion Apply 1 application topically daily as needed (for skin).    Marland Kitchen dicyclomine (BENTYL) 10 MG capsule Take 1 capsule (10 mg total) by mouth 4 (four) times daily -  before meals and at bedtime. 90 capsule 1  . fluticasone (FLONASE) 50 MCG/ACT nasal spray Place 2 sprays into both nostrils daily as needed for allergies or rhinitis.    . hydrocortisone  (ANUSOL-HC) 2.5 % rectal cream Place 1 application rectally at bedtime. At bedtime for 7 days 30 g 1  . hyoscyamine (ANASPAZ) 0.125 MG TBDP disintergrating tablet Place 1 tablet (0.125 mg total) under the tongue every 6 (six) hours as needed. 30 tablet 3  . ipratropium (ATROVENT) 0.06 % nasal spray Place 2 sprays into both nostrils every 4 (four) hours as needed for rhinitis. 10 mL 6  . LORazepam (ATIVAN) 1 MG tablet Take 1 mg by mouth every 8 (eight) hours as needed for anxiety.     Marland Kitchen lubiprostone (AMITIZA) 24 MCG capsule Take 1 capsule (24 mcg total) by mouth 2 (two) times daily with a meal. 60 capsule 3  . naproxen (NAPROSYN) 500 MG tablet Take 1 tablet (500 mg total) by mouth 2 (two) times daily with a meal. 60 tablet 0  . omeprazole (PRILOSEC) 20 MG capsule Take 1 capsule (20 mg total) by mouth daily. 30 capsule 3  . tiZANidine (ZANAFLEX) 4 MG tablet Take 1 tablet (4 mg total) by mouth every 6 (six) hours as needed for muscle spasms. 30 tablet 0  . XARELTO 20 MG TABS tablet Take 1 tablet (20 mg total) by mouth daily. 30 tablet 0   No current facility-administered medications for this visit.    Allergies  Allergen Reactions  . Dexilant [Dexlansoprazole] Other (See Comments)    Muscle aches  . Ivp Dye [Iodinated Diagnostic Agents] Rash  . Soma [Carisoprodol] Other (See Comments)    Sores on arm  . Sulfamethoxazole Other (See Comments)    REACTION: unspecified      Review of Systems:  Constitutional:  No  fever, no chills, No recent illness.   HEENT: No  headache, no vision change  Cardiac: No  chest pain, No  pressure, No palpitation  Respiratory:  No  shortness of breath. No  Cough  Gastrointestinal: No  abdominal pain, No  nausea,   Musculoskeletal: No new myalgia/arthralgia  Neurologic: No  weakness, No  dizziness  Psychiatric: No  concerns with depression, +concerns with anxiety, No sleep problems, Hx bipolar   Exam:  BP 127/78   Pulse 78   Wt 178 lb (80.7 kg)    BMI 25.36 kg/m   Constitutional: VS see above. General Appearance: alert, well-developed, well-nourished, NAD  Eyes: Normal lids and conjunctive, non-icteric sclera  Ears, Nose, Mouth, Throat: MMM, Normal external inspection ears/nares/mouth/lips/gums.  Neck: No masses, trachea midline. No thyroid enlargement.  Respiratory: Normal respiratory effort. no wheeze, no rhonchi, no rales  Cardiovascular: S1/S2 normal, no murmur, no rub/gallop auscultated. RRR. No lower extremity edema.  Musculoskeletal: Gait normal. No clubbing/cyanosis of digits. Tenderness to palpation posterior right leg, straight leg raise negative bilaterally, some tenderness to quadratus lumborum area on right and left, normal range of motion lumbar spine  Neurological: Normal balance/coordination. No tremor. No cranial nerve deficit on limited exam.  Skin: warm, dry, intact. No rash/ulcer. No concerning nevi or subq nodules on limited exam.  Psychiatric: Normal judgment/insight. Normal mood and affect. Oriented x3.     ASSESSMENT/PLAN: The primary encounter diagnosis was Leg pain, central, right. Diagnoses of History of DVT of lower extremity and Chronic bilateral low back pain without sciatica were also pertinent to this visit. Advised patient to follow-up with sports medicine to further evaluate any musculoskeletal component which may be present. I suspect possible psychosomatic component versus residual pain if any significant vascular disruption from extensive DVT. Would consider fibromyalgia component however treatment of this would be difficult given the patient's history of bipolar and other medications as above, would need records from his psychiatrist and would like to cooperate with them if we were to start something like an amitriptyline or Cymbalta. Patient has history of intolerance to gabapentin.    Patient Instructions  Plan:  1. Will see if Dr. Donzetta Matters would like to schedule you for an ultrasound to  look at the veins - I'll get back to you or his office will call you for imaging   2. I would like to try a medication for the pain but we need to avoid antiinflammatories since you're on a blood thinner (to prevent bleeding), and avoid starting an anti-depressant/anti-pain medication without first consulting with your psychiatrist (I don't want to make the bipolar issue worse). Let's get some records from your psychiatrist and I can talk to her about starting a medication if it's ok with her.   3. Would try a follow-up visit with Dr. Darene Lamer to give [place] a second opinion on the possible musculoskeletal component of this pain (back pain, knee pain).   4. In the meantime, prescription written for Tramadol to take as needed for SEVERE pain - caution as this can cause tolerance/dependence. DO NOT take this medicine with the Ativan.     Visit summary with medication list and pertinent instructions was printed for patient to review. All questions at time of visit were answered - patient instructed to contact office with any additional concerns. ER/RTC precautions were reviewed with the patient. Follow-up plan: Return in about 1 week (around 08/05/2016) for follow-up with Dr. Sheppard Coil re: medications after discussion w/specialists, consult w/ Dr. Darene Lamer.   Message was sent through Epic to Dr. Donzetta Matters, have not heard back as of 08/06/2016. Have not received records from psychiatry either.

## 2016-07-31 ENCOUNTER — Ambulatory Visit (INDEPENDENT_AMBULATORY_CARE_PROVIDER_SITE_OTHER): Payer: Medicare Other | Admitting: Sports Medicine

## 2016-07-31 ENCOUNTER — Encounter: Payer: Self-pay | Admitting: Sports Medicine

## 2016-07-31 DIAGNOSIS — M5136 Other intervertebral disc degeneration, lumbar region: Secondary | ICD-10-CM

## 2016-07-31 DIAGNOSIS — M51369 Other intervertebral disc degeneration, lumbar region without mention of lumbar back pain or lower extremity pain: Secondary | ICD-10-CM | POA: Insufficient documentation

## 2016-07-31 MED ORDER — PREDNISONE 50 MG PO TABS: ORAL_TABLET | ORAL | 0 refills | Status: DC

## 2016-07-31 NOTE — Progress Notes (Signed)
   Subjective:    I'm seeing this patient as a consultation for:  Dr. Emeterio Reeve  CC: back pain  HPI: David Armstrong is a 66 y.o. Male with a history of DVT, peripheral neuropathy, nephrolithiasis, lumbar degenerative disc disease who presents with right-sided back pain that radiates down the back of the leg. He says he has had back pain for years. He says the pain is worse when he is sitting for a long time. He takes tylenol sometimes for the pain, but did not get lasting relief. He does have tramadol that he uses for breakthrough pain. He has tried going to the chiropractor, but he doesn't think that it helps. He denies any swelling, SOB, constitutional symptoms. He does report dark colored urine that he correlates to taking his Xarelto. Patient reports occasional numbness that travels down the back of his leg and sometimes into his toes.   Previous x-rays significant for degenerative disc disease L5-S1 with multilevel facet arthropathy.  Patient also complains of a lesion on the head of his penis that appeared a few days ago. Pt will follow-up with PCP.  Past medical history:  DVT, peripheral neuropathy, nephrolithiasis, lumbar degenerative disc disease.  See flowsheet/record as well for more information.  Surgical history: Negative.  See flowsheet/record as well for more information.  Family history: Negative.  See flowsheet/record as well for more information.  Social history: Negative.  See flowsheet/record as well for more information.  Allergies, and medications have been entered into the medical record, reviewed, and no changes needed.   Review of Systems: No headache, visual changes, nausea, vomiting, diarrhea, constipation, dizziness, abdominal pain, skin rash, fevers, chills, night sweats, weight loss, swollen lymph nodes, body aches, joint swelling, muscle aches, chest pain, shortness of breath, mood changes, visual or auditory hallucinations.   Objective:   General: Well  Developed, well nourished, and in no acute distress.  Neuro/Psych: Alert and oriented x3, extra-ocular muscles intact, able to move all 4 extremities, sensation grossly intact. Skin: Warm and dry, no rashes noted.  Respiratory: Not using accessory muscles, speaking in full sentences, trachea midline.  Cardiovascular: Pulses palpable, no extremity edema. Abdomen: Does not appear distended. Musculoskeletal: Full strength and ROM in all planes of hip and knee joints bilaterally. Some diffuse back soreness bilaterally, but could not palpate focal pain generator in the back. Pain on internal hip rotation bilaterally. Patellar and Achilles reflexes 2+ bilaterally.   Impression and Recommendations:   This case required medical decision making of moderate complexity.  Lumbar degenerative disc disease Likely lumbar spinal stenosis. Multiple degenerative changes on a previous x-ray. Prednisone, formal physical therapy. Return to see me in one month, MRI for interventional planning if no better.

## 2016-07-31 NOTE — Assessment & Plan Note (Signed)
Likely lumbar spinal stenosis. Multiple degenerative changes on a previous x-ray. Prednisone, formal physical therapy. Return to see me in one month, MRI for interventional planning if no better.

## 2016-08-04 ENCOUNTER — Ambulatory Visit: Payer: Medicare Other | Admitting: Nurse Practitioner

## 2016-08-05 DIAGNOSIS — L400 Psoriasis vulgaris: Secondary | ICD-10-CM | POA: Diagnosis not present

## 2016-08-05 DIAGNOSIS — N509 Disorder of male genital organs, unspecified: Secondary | ICD-10-CM | POA: Diagnosis not present

## 2016-08-06 ENCOUNTER — Ambulatory Visit (INDEPENDENT_AMBULATORY_CARE_PROVIDER_SITE_OTHER): Payer: Medicare Other | Admitting: Osteopathic Medicine

## 2016-08-06 ENCOUNTER — Ambulatory Visit (INDEPENDENT_AMBULATORY_CARE_PROVIDER_SITE_OTHER): Payer: Medicare Other | Admitting: Rehabilitative and Restorative Service Providers"

## 2016-08-06 VITALS — BP 113/73 | HR 82 | Wt 178.0 lb

## 2016-08-06 DIAGNOSIS — M545 Low back pain, unspecified: Secondary | ICD-10-CM

## 2016-08-06 DIAGNOSIS — R531 Weakness: Secondary | ICD-10-CM

## 2016-08-06 DIAGNOSIS — G8929 Other chronic pain: Secondary | ICD-10-CM

## 2016-08-06 DIAGNOSIS — R29898 Other symptoms and signs involving the musculoskeletal system: Secondary | ICD-10-CM

## 2016-08-06 DIAGNOSIS — Z86718 Personal history of other venous thrombosis and embolism: Secondary | ICD-10-CM | POA: Diagnosis not present

## 2016-08-06 DIAGNOSIS — M5136 Other intervertebral disc degeneration, lumbar region: Secondary | ICD-10-CM | POA: Diagnosis not present

## 2016-08-06 DIAGNOSIS — M79604 Pain in right leg: Secondary | ICD-10-CM | POA: Diagnosis not present

## 2016-08-06 NOTE — Therapy (Signed)
[place], [place], [address] Phone: [phone]   Fax:  [phone]  Physical Therapy Evaluation  Patient Details  Name: David Armstrong MRN: [id number] Date of Birth: 01-Jul-1950 Referring Provider: Dr Dianah Field  Encounter Date: 08/06/2016      PT End of Session - 08/06/16 1401    Visit Number 1   Number of Visits 12   Date for PT Re-Evaluation 09/17/16   PT Start Time 3474   PT Stop Time 1512   PT Time Calculation (min) 57 min   Activity Tolerance Patient tolerated treatment well      Past Medical History:  Diagnosis Date  . Anal fissure   . ANXIETY   . Arthritis    "lower back; hips;' right knee" (02/11/2016)  . Bilateral pulmonary embolism ([place]) 02/11/2016  . Bipolar disorder ([place])   . Chronic constipation   . Chronic lower back pain   . DEPRESSION   . DIVERTICULOSIS, COLON [phone]   Colonoscopy  . DVT (deep venous thrombosis) ([place]) 02/11/2016   RLE  . Fatty liver   . GERD   . H. pylori infection   . Hemorrhoids [id number]   Colonoscopy   . Hiatal hernia [id number]   EGD  . History of kidney stones   . HYPERLIPIDEMIA   . IBS (irritable bowel syndrome)   . Internal hemorrhoids   . Peripheral neuropathy   . Personal history of colonic polyps 10/27/2007   HYPERPLASTIC POLYP  . Pneumonia 12/2014  . TMJ (temporomandibular joint syndrome)   . Unspecified gastritis and gastroduodenitis without mention of hemorrhage 2009   EGD    Past Surgical History:  Procedure Laterality Date  . BELPHAROPTOSIS REPAIR Bilateral 01/2015   "droopy eyelids"  . CYSTOSCOPY/RETROGRADE/URETEROSCOPY/STONE EXTRACTION WITH BASKET  1987; 1992; 1999; 2010  . EXCISIONAL HEMORRHOIDECTOMY    . FOOT NEUROMA SURGERY Left 09/2015  . FOOT NEUROMA SURGERY Right 07/2015  . FOOT NEUROMA SURGERY Right 2004   "between big and 2nd toes; shortened my 2nd toe"  . FRACTURE SURGERY    . HAMMER TOE SURGERY  Left    "little toe"  . INCISION AND DRAINAGE FOOT Left 1992   "ball of foot; stepped on piece of hard wire & it went thru my shoe"  . KNEE ARTHROSCOPY Bilateral   . LITHOTRIPSY  1992  . NASAL FRACTURE SURGERY  1997  . PARTIAL KNEE ARTHROPLASTY Right 06/2003  . PLANTAR FASCIA RELEASE Bilateral 1987-1993   right-left  . RHINOPLASTY  2000  . SHOULDER ARTHROSCOPY Left 03/2011; 2013  . SHOULDER ARTHROSCOPY Right 2007   "frozen shoulder"  . TOENAIL EXCISION Bilateral 2012   "big toes"    There were no vitals filed for this visit.       Subjective Assessment - 08/06/16 1418    Subjective Patient reports that he has narrowing of discs in the lumbar spine - he has been having lumbar injections for 2-3 yrs, with last injection 6 months. He has had iincreased LBP and stiffness for the past 2-3 months. He feels weak and "rough" today. Had shingles and pneumonia injections yesterday and feels that is why he is not feeling well today.    Pertinent History Pheripherial neuropathy 2009; lumbar DDD for several years; partial Rt knee replacement 2005; Lt knee surgery 1997; 3 shoudler surgeries; 6 foot surgeries; DVT Rt LE 2017   How long can you sit comfortably? 5-30 min    How long can you stand comfortably?  10 min    How long can you walk comfortably? 30-45 min    Diagnostic tests xrays   Patient Stated Goals feel better; decrease pain    Currently in Pain? Yes   Pain Score 7    Pain Location Back   Pain Orientation Lower   Pain Descriptors / Indicators Sore   Pain Type Chronic pain   Pain Radiating Towards down into the back of the Rt thigh mostly in thigh occasionally into  the calf    Pain Onset More than a month ago   Pain Frequency Intermittent  almost constant    Aggravating Factors  standing; lying down; not sure    Pain Relieving Factors does not know - chiropractic care was helping but not lately             [facility] PT Assessment - 08/06/16 0001      Assessment   Medical  Diagnosis Lumbar DDD    Referring Provider Dr Dianah Field   Onset Date/Surgical Date 04/28/16   Hand Dominance Right   Next MD Visit none scheduled    Prior Therapy chiropractic care in past      Precautions   Precautions None     Balance Screen   Has the patient fallen in the past 6 months No   Has the patient had a decrease in activity level because of a fear of falling?  No   Is the patient reluctant to leave their home because of a fear of falling?  No     Home Environment   Additional Comments multilevel home - some difficulty with stairs - bedroom on primary level      Prior Function   Level of Independence Independent   Vocation Retired;On disability  01/2006   Vocation Requirements [place] [employer]- physical job    Leisure household activities; walking 2-3 times/wk - about 2 miles      Observation/Other Assessments   Focus on Therapeutic Outcomes (FOTO)  54% limitation      Sensation   Additional Comments intermittent symptoms from neuropathy      Posture/Postural Control   Posture Comments patient sitts forward flexed, arms resting on knees; standing with head forward, shoulders rounded, incresed thoracic kyphosis, decresaed lumbar lordosis      AROM   AROM Assessment Site --  pt reports stiffness in all spinal movements    Lumbar Flexion 100%   Lumbar Extension 55%   Lumbar - Right Side Bend 75%   Lumbar - Left Side Bend 75%   Lumbar - Right Rotation 50%   Lumbar - Left Rotation 50%     Strength   Overall Strength Comments bilat LE's grossly 5/5 except hip extension and abduction 4+/5      Flexibility   Hamstrings tight Rt at 75deg; Lt 80 deg    Quadriceps tight Rt 100 deg; Lt 120 deg    ITB tight Rt > Lt    Piriformis tight Rt > Lt     Palpation   Spinal mobility hypomobile lumbar spine with CPA mobs    Palpation comment muscular tightness Rt > Lt hip piriformis/hip abductor                    [facility] Adult PT Treatment/Exercise -  08/06/16 0001      Lumbar Exercises: Stretches   Passive Hamstring Stretch 2 reps;30 seconds   Press Ups --  4 reps 2-3 sec hold    ITB Stretch 2 reps;30  seconds   Piriformis Stretch 2 reps;30 seconds  travell      Moist Heat Therapy   Number Minutes Moist Heat 15 Minutes   Moist Heat Location Lumbar Spine;Hip  bilat      Electrical Stimulation   Electrical Stimulation Location bilat lumbar bilat hips   Electrical Stimulation Action IFC   Electrical Stimulation Parameters to tolerance   Electrical Stimulation Goals Pain;Tone                PT Education - 08/06/16 1501    Education provided Yes   Education Details HEP DN   Person(s) Educated Patient   Methods Explanation;Demonstration;Tactile cues;Verbal cues;Handout   Comprehension Verbalized understanding;Returned demonstration;Verbal cues required;Tactile cues required             PT Long Term Goals - 08/06/16 1516      PT LONG TERM GOAL #1   Title Improve mobility and ROM bilat LE's 09/17/16   Time 6   Period Weeks   Status New     PT LONG TERM GOAL #2   Title 5/5 bilat LE's 09/17/16   Time 6   Period Weeks   Status New     PT LONG TERM GOAL #3   Title Patient reports and demonstrates increased activity tolerance with decreased pain 09/17/16   Time 6   Period Weeks   Status New     PT LONG TERM GOAL #4   Title Independent HEP 09/17/16   Time 6   Period Weeks   Status New     PT LONG TERM GOAL #5   Title Improve FOTO to </= 42% limitation 09/17/16   Time 6   Period Weeks   Status New               Plan - 08/06/16 1502    Clinical Impression Statement Patient was seen for low complexity evaluation 69f lumbar DDD with intermittent radicular pain into the Rt posterior thigh. He has poor posture and alignment; limited trunk extension and LE mobilty. There is muscular tightness to palpatiion through the piriformis and hip abductors Rt > Lt; hypomobility through the lumbar spine; pain in the  lumbar spine and into the Rt LE. He has poor core stability and functional activity tolerance. Patiient will benefit form PT to address problems identified.    Rehab Potential Good   PT Frequency 2x / week   PT Duration 6 weeks   PT Treatment/Interventions Patient/family education;ADLs/Self Care Home Management;Cryotherapy;Electrical Stimulation;Iontophoresis 4mg /ml Dexamethasone;Moist Heat;Ultrasound;Traction;Dry needling;Manual techniques;Therapeutic activities;Therapeutic exercise;Neuromuscular re-education   PT Next Visit Plan progress with stretching; core stabilization; strengthening; education in back care and body mechanics; DN as indicated    Consulted and Agree with Plan of Care Patient      Patient will benefit from skilled therapeutic intervention in order to improve the following deficits and impairments:  Postural dysfunction, Improper body mechanics, Pain, Decreased range of motion, Decreased strength, Decreased mobility, Decreased activity tolerance  Visit Diagnosis: Chronic bilateral low back pain, with sciatica presence unspecified - Plan: PT plan of care cert/re-cert  Other symptoms and signs involving the musculoskeletal system - Plan: PT plan of care cert/re-cert  Weakness generalized - Plan: PT plan of care cert/re-cert     Problem List Patient Active Problem List   Diagnosis Date Noted  . Lumbar degenerative disc disease 07/31/2016  . Chronic anticoagulation 06/04/2016  . Gross hematuria 03/17/2016  . Right calf pain   . DVT, lower extremity, proximal, acute, right ([place])  02/13/2016  . DVT, lower extremity, distal, acute, right ([place]) 02/13/2016  . Bilateral pulmonary embolism ([place]) 02/11/2016  . Pulmonary embolism ([place]) 02/11/2016  . Shortness of breath 02/08/2016  . Hearing loss due to cerumen impaction, right 01/19/2015  . Acute bronchitis 01/15/2015  . Nephrolithiasis 04/19/2014  . Peripheral neuropathy ([place]) 12/15/2013  . Thrombosed external hemorrhoid  s/p I&D [id number] 06/09/2012  . 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 IBS (irritable bowel syndrome) 03/09/2012  . Abdominal pain 05/19/2011  . 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 ABDOMINAL PAIN 11/23/2009  . GERD 10/26/2007  . DIVERTICULOSIS, COLON 10/25/2007  . RENAL CALCULUS 10/25/2007  . HEMORRHOIDS 11/24/2006  . Hyperlipidemia 11/23/2006  . Anxiety state 11/23/2006  . DEPRESSION 11/23/2006  . COLONIC POLYPS, HX OF 11/23/2006    David Armstrong PT, MPH  08/06/2016, 3:23 PM  [facility] [place], [place], [address] Phone: [phone]   Fax:  [phone]  Name: David Armstrong MRN: [id number] Date of Birth: 09/01/1950

## 2016-08-06 NOTE — Patient Instructions (Signed)
HIP: Hamstrings - Supine   Place strap around foot. Raise leg up, keeping knee straight.  Bend opposite knee to protect back if indicated. Hold 30 seconds. 3 reps per set, 2-3 sets per day     Outer Hip Stretch: Reclined IT Band Stretch (Strap)   Strap around one foot, pull leg across body until you feel a pull or stretch, with shoulders on mat. Hold for 30 seconds. Repeat 3 times each leg. 2-3 times/day.  Piriformis Stretch   Lying on back, pull right knee toward opposite shoulder. Hold 30 seconds. Repeat 3 times. Do 2-3 sessions per day.   Trunk: Prone Extension (Press-Ups)    Lie on stomach on firm, flat surface. Relax bottom and legs. Raise chest in air with elbows straight. Keep hips flat on surface, sag stomach. Hold __1-2__ seconds. Repeat _5-10___ times. Do _2___ sessions per day. CAUTION: Movement should be gentle and slow.   Trunk Extension    Standing, place back of open hands on low back. Straighten spine then arch the back and move shoulders back. Repeat _2-3___ times per session. Do _several___ sessions per day   Trigger Point Dry Needling  . What is Trigger Point Dry Needling (DN)? o DN is a physical therapy technique used to treat muscle pain and dysfunction. Specifically, DN helps deactivate muscle trigger points (muscle knots).  o A thin filiform needle is used to penetrate the skin and stimulate the underlying trigger point. The goal is for a local twitch response (LTR) to occur and for the trigger point to relax. No medication of any kind is injected during the procedure.   . What Does Trigger Point Dry Needling Feel Like?  o The procedure feels different for each individual patient. Some patients report that they do not actually feel the needle enter the skin and overall the process is not painful. Very mild bleeding may occur. However, many patients feel a deep cramping in the muscle in which the needle was inserted. This is the local twitch  response.   Marland Kitchen How Will I feel after the treatment? o Soreness is normal, and the onset of soreness may not occur for a few hours. Typically this soreness does not last longer than two days.  o Bruising is uncommon, however; ice can be used to decrease any possible bruising.  o In rare cases feeling tired or nauseous after the treatment is normal. In addition, your symptoms may get worse before they get better, this period will typically not last longer than 24 hours.   . What Can I do After My Treatment? o Increase your hydration by drinking more water for the next 24 hours. o You may place ice or heat on the areas treated that have become sore, however, do not use heat on inflamed or bruised areas. Heat often brings more relief post needling. o You can continue your regular activities, but vigorous activity is not recommended initially after the treatment for 24 hours. o DN is best combined with other physical therapy such as strengthening, stretching, and other therapies.

## 2016-08-06 NOTE — Progress Notes (Signed)
HPI: David Armstrong is a 66 y.o. male  who presents to [facility] today, 08/06/16,  for chief complaint of:  Chief Complaint  Patient presents with  . Follow-up    leg and back pain - worried about blood clot    David Armstrong is following up after recent evaluation with sports medicine for back pain and how this may relate to leg pain which he worries is form DVT. He is on Xarelto. He is following with vascular surgery - they are planning for repeat imaging in a few months. He is following with sports med - they have recommended PT and considering MRI for back issues /Lumbar DDD and they Rx prednisone which helped somewhat per patient.   Past medical history, surgical history, social history and family history reviewed.  Patient Active Problem List   Diagnosis Date Noted  . Lumbar degenerative disc disease 07/31/2016  . Chronic anticoagulation 06/04/2016  . Gross hematuria 03/17/2016  . Right calf pain   . DVT, lower extremity, proximal, acute, right ([place]) 02/13/2016  . DVT, lower extremity, distal, acute, right ([place]) 02/13/2016  . Bilateral pulmonary embolism ([place]) 02/11/2016  . Pulmonary embolism ([place]) 02/11/2016  . Shortness of breath 02/08/2016  . Hearing loss due to cerumen impaction, right 01/19/2015  . Acute bronchitis 01/15/2015  . Nephrolithiasis 04/19/2014  . Peripheral neuropathy ([place]) 12/15/2013  . Thrombosed external hemorrhoid s/p I&D [id number] 06/09/2012  . 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 IBS (irritable bowel syndrome) 03/09/2012  . Abdominal pain 05/19/2011  . 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 ABDOMINAL PAIN 11/23/2009  . GERD 10/26/2007  . DIVERTICULOSIS, COLON 10/25/2007  . RENAL CALCULUS 10/25/2007  . HEMORRHOIDS 11/24/2006  . Hyperlipidemia 11/23/2006  . Anxiety state 11/23/2006  . DEPRESSION 11/23/2006  . COLONIC POLYPS, HX OF 11/23/2006    Current medication list and allergy/intolerance information  reviewed.   Current Outpatient Prescriptions on File Prior to Visit  Medication Sig Dispense Refill  . AMBULATORY NON FORMULARY MEDICATION Medication Name: Nitroglycerin Gel .0125% use rectally three times a day 30 g 0  . desonide (DESOWEN) 0.05 % lotion Apply 1 application topically daily as needed (for skin).    Marland Kitchen dicyclomine (BENTYL) 10 MG capsule Take 1 capsule (10 mg total) by mouth 4 (four) times daily -  before meals and at bedtime. 90 capsule 1  . fluticasone (FLONASE) 50 MCG/ACT nasal spray Place 2 sprays into both nostrils daily as needed for allergies or rhinitis.    . hydrocortisone (ANUSOL-HC) 2.5 % rectal cream Place 1 application rectally at bedtime. At bedtime for 7 days 30 g 1  . hyoscyamine (ANASPAZ) 0.125 MG TBDP disintergrating tablet Place 1 tablet (0.125 mg total) under the tongue every 6 (six) hours as needed. 30 tablet 3  . ipratropium (ATROVENT) 0.06 % nasal spray Place 2 sprays into both nostrils every 4 (four) hours as needed for rhinitis. 10 mL 6  . LORazepam (ATIVAN) 1 MG tablet Take 1 mg by mouth every 8 (eight) hours as needed for anxiety.     Marland Kitchen lubiprostone (AMITIZA) 24 MCG capsule Take 1 capsule (24 mcg total) by mouth 2 (two) times daily with a meal. 60 capsule 3  . omeprazole (PRILOSEC) 20 MG capsule Take 1 capsule (20 mg total) by mouth daily. 30 capsule 3  . predniSONE (DELTASONE) 50 MG tablet One tab PO daily for 5 days. 5 tablet 0  . tiZANidine (ZANAFLEX) 4 MG tablet Take 1 tablet (4  mg total) by mouth every 6 (six) hours as needed for muscle spasms. 30 tablet 0  . traMADol (ULTRAM) 50 MG tablet Take 1 tablet (50 mg total) by mouth 2 (two) times daily as needed for severe pain. 15 tablet 0  . XARELTO 20 MG TABS tablet Take 1 tablet (20 mg total) by mouth daily. 30 tablet 0   No current facility-administered medications on file prior to visit.    Allergies  Allergen Reactions  . Dexilant [Dexlansoprazole] Other (See Comments)    Muscle aches  . Ivp Dye  [Iodinated Diagnostic Agents] Rash  . Soma [Carisoprodol] Other (See Comments)    Sores on arm  . Sulfamethoxazole Other (See Comments)    REACTION: unspecified      Review of Systems:  Cardiac: No  chest pain, No  pressure, No palpitations  Respiratory:  No  shortness of breath.   Musculoskeletal: No new myalgia/arthralgia   Exam:  BP 113/73   Pulse 82   Wt 178 lb (80.7 kg)   BMI 25.36 kg/m   Constitutional: VS see above. General Appearance: alert, well-developed, well-nourished, NAD  Respiratory: Normal respiratory effort.   Cardiovascular: S1/S2 normal RRR  Musculoskeletal: Gait normal. Symmetric and independent movement of all extremities  Neurological: Normal balance/coordination. No tremor.  Skin: warm, dry, intact.   Psychiatric: Fair judgment/insight. Anxious mood and affect. Oriented x3.       ASSESSMENT/PLAN: The primary encounter diagnosis was Lumbar degenerative disc disease. Diagnoses of Leg pain, central, right, History of DVT of lower extremity, and Chronic bilateral low back pain without sciatica were also pertinent to this visit.   Pt is anxious about the blood clot causing his pain. I have reassured him on multiple occasions that he is on appropriate DVT treatment and we have consulted other specialists as well to evaluate for unusual causes. I suspect psychosomatic component - he is understandably shaken by this illness, there may be some endothelial damage from DVT but I doubt enough to explain his symptoms, this is better explained by lumbar pathology which is also being worked up and treated.   Reassurance with watchful waiting - Sports med is addressing lumbar DDD and considering MRI for further evaluation. I'm reluctant to get any vascular imaging early without the recommendation of David Armstrong, pt is ok to wait to get the imaging done as scheduled and I encouraged him to contact David Armstrong directly if he feels that his concerns were not fully  addressed    Follow-up plan: Return in about 6 months (around 02/05/2017) for [place], sooner if needed .  Visit summary with medication list and pertinent instructions was printed for patient to review, alert [place] if any changes needed. All questions at time of visit were answered - patient instructed to contact office with any additional concerns. ER/RTC precautions were reviewed with the patient and understanding verbalized.   Note: Total time spent 15 minutes, greater than 50% of the visit was spent face-to-face counseling and coordinating care for the following: The primary encounter diagnosis was Lumbar degenerative disc disease. Diagnoses of Leg pain, central, right, History of DVT of lower extremity, and Chronic bilateral low back pain without sciatica were also pertinent to this visit.Marland Kitchen

## 2016-08-11 ENCOUNTER — Ambulatory Visit (INDEPENDENT_AMBULATORY_CARE_PROVIDER_SITE_OTHER): Payer: Medicare Other | Admitting: Physical Therapy

## 2016-08-11 DIAGNOSIS — R531 Weakness: Secondary | ICD-10-CM | POA: Diagnosis not present

## 2016-08-11 DIAGNOSIS — G8929 Other chronic pain: Secondary | ICD-10-CM | POA: Diagnosis not present

## 2016-08-11 DIAGNOSIS — M545 Low back pain: Secondary | ICD-10-CM

## 2016-08-11 DIAGNOSIS — R29898 Other symptoms and signs involving the musculoskeletal system: Secondary | ICD-10-CM

## 2016-08-11 NOTE — Therapy (Signed)
[facility] [place], [place], [address] Phone: [phone]   Fax:  [phone]  Physical Therapy Treatment  Patient Details  Name: David Armstrong MRN: [id number] Date of Birth: 1951-04-02 Referring Provider: Dr. Dianah Field   Encounter Date: 08/11/2016      PT End of Session - 08/11/16 1433    Visit Number 2   Number of Visits 12   Date for PT Re-Evaluation 09/17/16   PT Start Time 1402   PT Stop Time 1447   PT Time Calculation (min) 45 min   Activity Tolerance Patient tolerated treatment well      Past Medical History:  Diagnosis Date  . Anal fissure   . ANXIETY   . Arthritis    "lower back; hips;' right knee" (02/11/2016)  . Bilateral pulmonary embolism ([place]) 02/11/2016  . Bipolar disorder ([place])   . Chronic constipation   . Chronic lower back pain   . DEPRESSION   . DIVERTICULOSIS, COLON [phone]   Colonoscopy  . DVT (deep venous thrombosis) ([place]) 02/11/2016   RLE  . Fatty liver   . GERD   . H. pylori infection   . Hemorrhoids [id number]   Colonoscopy   . Hiatal hernia [id number]   EGD  . History of kidney stones   . HYPERLIPIDEMIA   . IBS (irritable bowel syndrome)   . Internal hemorrhoids   . Peripheral neuropathy   . Personal history of colonic polyps 10/27/2007   HYPERPLASTIC POLYP  . Pneumonia 12/2014  . TMJ (temporomandibular joint syndrome)   . Unspecified gastritis and gastroduodenitis without mention of hemorrhage 2009   EGD    Past Surgical History:  Procedure Laterality Date  . BELPHAROPTOSIS REPAIR Bilateral 01/2015   "droopy eyelids"  . CYSTOSCOPY/RETROGRADE/URETEROSCOPY/STONE EXTRACTION WITH BASKET  1987; 1992; 1999; 2010  . EXCISIONAL HEMORRHOIDECTOMY    . FOOT NEUROMA SURGERY Left 09/2015  . FOOT NEUROMA SURGERY Right 07/2015  . FOOT NEUROMA SURGERY Right 2004   "between big and 2nd toes; shortened my 2nd toe"  . FRACTURE SURGERY    . HAMMER TOE  SURGERY Left    "little toe"  . INCISION AND DRAINAGE FOOT Left 1992   "ball of foot; stepped on piece of hard wire & it went thru my shoe"  . KNEE ARTHROSCOPY Bilateral   . LITHOTRIPSY  1992  . NASAL FRACTURE SURGERY  1997  . PARTIAL KNEE ARTHROPLASTY Right 06/2003  . PLANTAR FASCIA RELEASE Bilateral 1987-1993   right-left  . RHINOPLASTY  2000  . SHOULDER ARTHROSCOPY Left 03/2011; 2013  . SHOULDER ARTHROSCOPY Right 2007   "frozen shoulder"  . TOENAIL EXCISION Bilateral 2012   "big toes"    There were no vitals filed for this visit.      Subjective Assessment - 08/11/16 1404    Subjective Pt reports he did some walking and stretches since last visit.  Pain in thigh is not as severe as it once was.    Currently in Pain? Yes   Pain Score 5    Pain Location Axilla   Pain Orientation Lower;Right   Pain Descriptors / Indicators Sore   Aggravating Factors  prolonged positions    Pain Relieving Factors ??            [facility] PT Assessment - 08/11/16 0001      Assessment   Medical Diagnosis Lumbar DDD    Referring Provider Dr. Dianah Field    Onset Date/Surgical Date 04/28/16   Hand  Dominance Right           [facility] Adult PT Treatment/Exercise - 08/11/16 0001      Lumbar Exercises: Stretches   Passive Hamstring Stretch 2 reps;30 seconds  supine with strap, opp knee bent   Standing Extension 5 reps  2- 5 sec hold   Prone on Elbows Stretch 5 reps;10 seconds   Prone on Elbows Stretch Limitations VC and demo from improved form.    ITB Stretch 2 reps;30 seconds  supine with strap   Piriformis Stretch 2 reps;30 seconds  travell      Lumbar Exercises: Aerobic   Stationary Bike NuStep L5: (arms/legs) 5 min      Lumbar Exercises: Supine   Ab Set 5 seconds;10 reps   AB Set Limitations VC/tactile cues; VC to breathe.    Other Supine Lumbar Exercises VC to engage core muscles for supine to sit and sit to stand.      Moist Heat Therapy   Number Minutes Moist Heat 15  Minutes   Moist Heat Location Lumbar Spine;Hip  bilat      Electrical Stimulation   Electrical Stimulation Location bilat lumbar parapsinals, hips    Electrical Stimulation Action IFC   Electrical Stimulation Parameters  to tolerance    Electrical Stimulation Goals Pain            PT Long Term Goals - 08/11/16 1418      PT LONG TERM GOAL #1   Title Improve mobility and ROM bilat LE's 09/17/16   Time 6   Period Weeks   Status On-going     PT LONG TERM GOAL #2   Title 5/5 bilat LE's 09/17/16   Time 6   Period Weeks   Status On-going     PT LONG TERM GOAL #3   Title Patient reports and demonstrates increased activity tolerance with decreased pain 09/17/16   Time 6   Period Weeks   Status On-going     PT LONG TERM GOAL #4   Title Independent HEP 09/17/16   Time 6   Period Weeks   Status On-going     PT LONG TERM GOAL #5   Title Improve FOTO to </= 42% limitation 09/17/16   Time 6   Period Weeks   Status On-going               Plan - 08/11/16 1540    Clinical Impression Statement HEP was reviewed with pt today.  Pt reported slight decrease in pain since eval, and further reduction of LBP after estim/MHP at end of session.  No goals met yet; only 2nd visit.    Rehab Potential Good   PT Frequency 2x / week   PT Duration 6 weeks   PT Treatment/Interventions Patient/family education;ADLs/Self Care Home Management;Cryotherapy;Electrical Stimulation;Iontophoresis 26m/ml Dexamethasone;Moist Heat;Ultrasound;Traction;Dry needling;Manual techniques;Therapeutic activities;Therapeutic exercise;Neuromuscular re-education   PT Next Visit Plan progress with stretching; core stabilization; strengthening; education in back care and body mechanics; DN as indicated    Consulted and Agree with Plan of Care Patient      Patient will benefit from skilled therapeutic intervention in order to improve the following deficits and impairments:  Postural dysfunction, Improper body mechanics,  Pain, Decreased range of motion, Decreased strength, Decreased mobility, Decreased activity tolerance  Visit Diagnosis: Chronic bilateral low back pain, with sciatica presence unspecified  Other symptoms and signs involving the musculoskeletal system  Weakness generalized     Problem List Patient Active Problem List   Diagnosis Date Noted  .  Lumbar degenerative disc disease 07/31/2016  . Chronic anticoagulation 06/04/2016  . Gross hematuria 03/17/2016  . Right calf pain   . DVT, lower extremity, proximal, acute, right ([place]) 02/13/2016  . DVT, lower extremity, distal, acute, right ([place]) 02/13/2016  . Bilateral pulmonary embolism ([place]) 02/11/2016  . Pulmonary embolism ([place]) 02/11/2016  . Shortness of breath 02/08/2016  . Hearing loss due to cerumen impaction, right 01/19/2015  . Acute bronchitis 01/15/2015  . Nephrolithiasis 04/19/2014  . Peripheral neuropathy ([place]) 12/15/2013  . Thrombosed external hemorrhoid s/p I&D [id number] 06/09/2012  . 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 IBS (irritable bowel syndrome) 03/09/2012  . Abdominal pain 05/19/2011  . 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 ABDOMINAL PAIN 11/23/2009  . GERD 10/26/2007  . DIVERTICULOSIS, COLON 10/25/2007  . RENAL CALCULUS 10/25/2007  . HEMORRHOIDS 11/24/2006  . Hyperlipidemia 11/23/2006  . Anxiety state 11/23/2006  . DEPRESSION 11/23/2006  . COLONIC POLYPS, HX OF 11/23/2006   Kerin Perna, PTA 08/11/16 3:45 PM  [facility] [place], [place], [address] Phone: [phone]   Fax:  [phone]  Name: BELINDA BRINGHURST MRN: [id number] Date of Birth: 1950-07-18

## 2016-08-14 ENCOUNTER — Ambulatory Visit (INDEPENDENT_AMBULATORY_CARE_PROVIDER_SITE_OTHER): Payer: Medicare Other | Admitting: Rehabilitative and Restorative Service Providers"

## 2016-08-14 ENCOUNTER — Encounter: Payer: Self-pay | Admitting: Rehabilitative and Restorative Service Providers"

## 2016-08-14 DIAGNOSIS — G8929 Other chronic pain: Secondary | ICD-10-CM

## 2016-08-14 DIAGNOSIS — R29898 Other symptoms and signs involving the musculoskeletal system: Secondary | ICD-10-CM | POA: Diagnosis not present

## 2016-08-14 DIAGNOSIS — M545 Low back pain: Secondary | ICD-10-CM | POA: Diagnosis not present

## 2016-08-14 DIAGNOSIS — R531 Weakness: Secondary | ICD-10-CM | POA: Diagnosis not present

## 2016-08-14 NOTE — Patient Instructions (Signed)
.  Trigger Point Dry Needling  . What is Trigger Point Dry Needling (DN)? o DN is a physical therapy technique used to treat muscle pain and dysfunction. Specifically, DN helps deactivate muscle trigger points (muscle knots).  o A thin filiform needle is used to penetrate the skin and stimulate the underlying trigger point. The goal is for a local twitch response (LTR) to occur and for the trigger point to relax. No medication of any kind is injected during the procedure.   . What Does Trigger Point Dry Needling Feel Like?  o The procedure feels different for each individual patient. Some patients report that they do not actually feel the needle enter the skin and overall the process is not painful. Very mild bleeding may occur. However, many patients feel a deep cramping in the muscle in which the needle was inserted. This is the local twitch response.   Marland Kitchen How Will I feel after the treatment? o Soreness is normal, and the onset of soreness may not occur for a few hours. Typically this soreness does not last longer than two days.  o Bruising is uncommon, however; ice can be used to decrease any possible bruising.  o In rare cases feeling tired or nauseous after the treatment is normal. In addition, your symptoms may get worse before they get better, this period will typically not last longer than 24 hours.   . What Can I do After My Treatment? o Increase your hydration by drinking more water for the next 24 hours. o You may place ice or heat on the areas treated that have become sore, however, do not use heat on inflamed or bruised areas. Heat often brings more relief post needling. o You can continue your regular activities, but vigorous activity is not recommended initially after the treatment for 24 hours. o DN is best combined with other physical therapy such as strengthening, stretching, and other therapies.    Stretch out strap - green strap  [email or web address]

## 2016-08-14 NOTE — Therapy (Signed)
[facility] [place], [place], [address] Phone: [phone]   Fax:  [phone]  Physical Therapy Treatment  Patient Details  Name: David Armstrong MRN: [id number] Date of Birth: [birth year] Referring Provider: Dr. Dianah Field   Encounter Date: 08/14/2016      PT End of Session - 08/14/16 1409    Visit Number 3   Number of Visits 12   Date for PT Re-Evaluation 09/17/16   PT Start Time 1401   PT Stop Time 1456   PT Time Calculation (min) 55 min   Activity Tolerance Patient tolerated treatment well      Past Medical History:  Diagnosis Date  . Anal fissure   . ANXIETY   . Arthritis    "lower back; hips;' right knee" (02/11/2016)  . Bilateral pulmonary embolism ([place]) 02/11/2016  . Bipolar disorder ([place])   . Chronic constipation   . Chronic lower back pain   . DEPRESSION   . DIVERTICULOSIS, COLON [phone]   Colonoscopy  . DVT (deep venous thrombosis) ([place]) 02/11/2016   RLE  . Fatty liver   . GERD   . H. pylori infection   . Hemorrhoids [id number]   Colonoscopy   . Hiatal hernia [id number]   EGD  . History of kidney stones   . HYPERLIPIDEMIA   . IBS (irritable bowel syndrome)   . Internal hemorrhoids   . Peripheral neuropathy   . Personal history of colonic polyps 10/27/2007   HYPERPLASTIC POLYP  . Pneumonia 12/2014  . TMJ (temporomandibular joint syndrome)   . Unspecified gastritis and gastroduodenitis without mention of hemorrhage 2009   EGD    Past Surgical History:  Procedure Laterality Date  . BELPHAROPTOSIS REPAIR Bilateral 01/2015   "droopy eyelids"  . CYSTOSCOPY/RETROGRADE/URETEROSCOPY/STONE EXTRACTION WITH BASKET  1987; 1992; 1999; 2010  . EXCISIONAL HEMORRHOIDECTOMY    . FOOT NEUROMA SURGERY Left 09/2015  . FOOT NEUROMA SURGERY Right 07/2015  . FOOT NEUROMA SURGERY Right 2004   "between big and 2nd toes; shortened my 2nd toe"  . FRACTURE SURGERY    . HAMMER TOE SURGERY  Left    "little toe"  . INCISION AND DRAINAGE FOOT Left 1992   "ball of foot; stepped on piece of hard wire & it went thru my shoe"  . KNEE ARTHROSCOPY Bilateral   . LITHOTRIPSY  1992  . NASAL FRACTURE SURGERY  1997  . PARTIAL KNEE ARTHROPLASTY Right 06/2003  . PLANTAR FASCIA RELEASE Bilateral 1987-1993   right-left  . RHINOPLASTY  2000  . SHOULDER ARTHROSCOPY Left 03/2011; 2013  . SHOULDER ARTHROSCOPY Right 2007   "frozen shoulder"  . TOENAIL EXCISION Bilateral 2012   "big toes"    There were no vitals filed for this visit.      Subjective Assessment - 08/14/16 1406    Subjective David Armstrong reports that he has more back pain today. Did a lot of walking yesterday.    Currently in Pain? Yes   Pain Score 5    Pain Location Back   Pain Orientation Lower;Right   Pain Descriptors / Indicators Sore;Tightness   Pain Type Chronic pain   Pain Onset More than a month ago                         [facility] Adult PT Treatment/Exercise - 08/14/16 0001      Therapeutic Activites    Therapeutic Activities --  working on transfers and transitional movements  Lumbar Exercises: Stretches   Passive Hamstring Stretch 2 reps;30 seconds  supine with strap, opp knee bent   Prone on Elbows Stretch 3 reps;10 seconds   Prone on Elbows Stretch Limitations VC and demo from improved form.    Press Ups --  prone press up 1-2 sec x 5    ITB Stretch 2 reps;30 seconds  supine with strap   Piriformis Stretch 2 reps;30 seconds  travell      Lumbar Exercises: Aerobic   Stationary Bike NuStep L5: (arms - 8/legs) 6 min      Moist Heat Therapy   Number Minutes Moist Heat 20 Minutes   Moist Heat Location Lumbar Spine;Hip  bilat      Electrical Stimulation   Electrical Stimulation Location bilat lumbar parapsinals, hips    Electrical Stimulation Action IFC   Electrical Stimulation Parameters to tolerance   Electrical Stimulation Goals Pain;Tone     Manual Therapy   Manual therapy  comments pt prone    Joint Mobilization lumbar CPA Grade II/III   Soft tissue mobilization bilat lumbar to piriformis/hip abductors   Myofascial Release lumbar/hips           Trigger Point Dry Needling - 08/14/16 1444    Consent Given? Yes   Education Handout Provided Yes   Muscles Treated Upper Body Longissimus  bilat lumbar and hip DN   Longissimus Response Palpable increased muscle length  bilat lumbar - multifidi area    Gluteus Maximus Response Palpable increased muscle length   Gluteus Minimus Response Palpable increased muscle length   Piriformis Response Palpable increased muscle length              PT Education - 08/14/16 1417    Education provided Yes   Education Details DN; stretch out strap   Person(s) Educated Patient   Methods Explanation   Comprehension Verbalized understanding             PT Long Term Goals - 08/11/16 1418      PT LONG TERM GOAL #1   Title Improve mobility and ROM bilat LE's 09/17/16   Time 6   Period Weeks   Status On-going     PT LONG TERM GOAL #2   Title 5/5 bilat LE's 09/17/16   Time 6   Period Weeks   Status On-going     PT LONG TERM GOAL #3   Title Patient reports and demonstrates increased activity tolerance with decreased pain 09/17/16   Time 6   Period Weeks   Status On-going     PT LONG TERM GOAL #4   Title Independent HEP 09/17/16   Time 6   Period Weeks   Status On-going     PT LONG TERM GOAL #5   Title Improve FOTO to </= 42% limitation 09/17/16   Time 6   Period Weeks   Status On-going               Plan - 08/14/16 1409    Clinical Impression Statement Pain continues. Patient tolerated DN well. Note decrease in palpable tightness following DN and manual work.    Rehab Potential Good   PT Frequency 2x / week   PT Duration 6 weeks   PT Treatment/Interventions Patient/family education;ADLs/Self Care Home Management;Cryotherapy;Electrical Stimulation;Iontophoresis 4mg /ml Dexamethasone;Moist  Heat;Ultrasound;Traction;Dry needling;Manual techniques;Therapeutic activities;Therapeutic exercise;Neuromuscular re-education   PT Next Visit Plan progress with stretching; core stabilization; strengthening; education in back care and body mechanics; assess response DN   Consulted and Agree with  Plan of Care Patient      Patient will benefit from skilled therapeutic intervention in order to improve the following deficits and impairments:  Postural dysfunction, Improper body mechanics, Pain, Decreased range of motion, Decreased strength, Decreased mobility, Decreased activity tolerance  Visit Diagnosis: Chronic bilateral low back pain, with sciatica presence unspecified  Other symptoms and signs involving the musculoskeletal system  Weakness generalized     Problem List Patient Active Problem List   Diagnosis Date Noted  . Lumbar degenerative disc disease 07/31/2016  . Chronic anticoagulation 06/04/2016  . Gross hematuria 03/17/2016  . Right calf pain   . DVT, lower extremity, proximal, acute, right ([place]) 02/13/2016  . DVT, lower extremity, distal, acute, right ([place]) 02/13/2016  . Bilateral pulmonary embolism ([place]) 02/11/2016  . Pulmonary embolism ([place]) 02/11/2016  . Shortness of breath 02/08/2016  . Hearing loss due to cerumen impaction, right 01/19/2015  . Acute bronchitis 01/15/2015  . Nephrolithiasis 04/19/2014  . Peripheral neuropathy ([place]) 12/15/2013  . Thrombosed external hemorrhoid s/p I&D [id number] 06/09/2012  . 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 IBS (irritable bowel syndrome) 03/09/2012  . Abdominal pain 05/19/2011  . 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 ABDOMINAL PAIN 11/23/2009  . GERD 10/26/2007  . DIVERTICULOSIS, COLON 10/25/2007  . RENAL CALCULUS 10/25/2007  . HEMORRHOIDS 11/24/2006  . Hyperlipidemia 11/23/2006  . Anxiety state 11/23/2006  . DEPRESSION 11/23/2006  . COLONIC POLYPS, HX OF 11/23/2006    David Armstrong PT, MPH  08/14/2016, 2:46  PM  [facility] [place], [place], [address] Phone: [phone]   Fax:  [phone]  Name: David Armstrong MRN: [id number] Date of Birth: October 19, 1950

## 2016-08-18 ENCOUNTER — Ambulatory Visit (INDEPENDENT_AMBULATORY_CARE_PROVIDER_SITE_OTHER): Payer: Medicare Other | Admitting: Physical Therapy

## 2016-08-18 DIAGNOSIS — R531 Weakness: Secondary | ICD-10-CM | POA: Diagnosis not present

## 2016-08-18 DIAGNOSIS — G8929 Other chronic pain: Secondary | ICD-10-CM | POA: Diagnosis not present

## 2016-08-18 DIAGNOSIS — M545 Low back pain: Secondary | ICD-10-CM | POA: Diagnosis not present

## 2016-08-18 DIAGNOSIS — R29898 Other symptoms and signs involving the musculoskeletal system: Secondary | ICD-10-CM | POA: Diagnosis not present

## 2016-08-18 NOTE — Therapy (Signed)
[facility] [place], [place], [address] Phone: [phone]   Fax:  [phone]  Physical Therapy Treatment  Patient Details  Name: David Armstrong MRN: [id number] Date of Birth: October 31, 1950 Referring Provider: Dr. Dianah Field  Encounter Date: 08/18/2016      PT End of Session - 08/18/16 1406    Visit Number 4   Number of Visits 12   Date for PT Re-Evaluation 09/17/16   PT Start Time 1402   PT Stop Time 1447   PT Time Calculation (min) 45 min   Activity Tolerance Patient tolerated treatment well   Behavior During Therapy [facility] for tasks assessed/performed      Past Medical History:  Diagnosis Date  . Anal fissure   . ANXIETY   . Arthritis    "lower back; hips;' right knee" (02/11/2016)  . Bilateral pulmonary embolism ([place]) 02/11/2016  . Bipolar disorder ([place])   . Chronic constipation   . Chronic lower back pain   . DEPRESSION   . DIVERTICULOSIS, COLON [phone]   Colonoscopy  . DVT (deep venous thrombosis) ([place]) 02/11/2016   RLE  . Fatty liver   . GERD   . H. pylori infection   . Hemorrhoids [id number]   Colonoscopy   . Hiatal hernia 3546,5681   EGD  . History of kidney stones   . HYPERLIPIDEMIA   . IBS (irritable bowel syndrome)   . Internal hemorrhoids   . Peripheral neuropathy   . Personal history of colonic polyps 10/27/2007   HYPERPLASTIC POLYP  . Pneumonia 12/2014  . TMJ (temporomandibular joint syndrome)   . Unspecified gastritis and gastroduodenitis without mention of hemorrhage 2009   EGD    Past Surgical History:  Procedure Laterality Date  . BELPHAROPTOSIS REPAIR Bilateral 01/2015   "droopy eyelids"  . CYSTOSCOPY/RETROGRADE/URETEROSCOPY/STONE EXTRACTION WITH BASKET  1987; 1992; 1999; 2010  . EXCISIONAL HEMORRHOIDECTOMY    . FOOT NEUROMA SURGERY Left 09/2015  . FOOT NEUROMA SURGERY Right 07/2015  . FOOT NEUROMA SURGERY Right 2004   "between big and 2nd toes;  shortened my 2nd toe"  . FRACTURE SURGERY    . HAMMER TOE SURGERY Left    "little toe"  . INCISION AND DRAINAGE FOOT Left 1992   "ball of foot; stepped on piece of hard wire & it went thru my shoe"  . KNEE ARTHROSCOPY Bilateral   . LITHOTRIPSY  1992  . NASAL FRACTURE SURGERY  1997  . PARTIAL KNEE ARTHROPLASTY Right 06/2003  . PLANTAR FASCIA RELEASE Bilateral 1987-1993   right-left  . RHINOPLASTY  2000  . SHOULDER ARTHROSCOPY Left 03/2011; 2013  . SHOULDER ARTHROSCOPY Right 2007   "frozen shoulder"  . TOENAIL EXCISION Bilateral 2012   "big toes"    There were no vitals filed for this visit.      Subjective Assessment - 08/18/16 1406    Subjective Pt reports he is unsure the dry needling helped any.  He hasn't done any of the stretches, but recently ordered the stretch strap.  He had to do some cleaning around house; pain in low back continues. He is having dental surgery next wk for tooth fracture.   Pertinent History Pheripherial neuropathy 2009; lumbar DDD for several years; partial Rt knee replacement 2005; Lt knee surgery 1997; 3 shoulder surgeries; 6 foot surgeries; DVT Rt LE 2017   Currently in Pain? Yes   Pain Score 5    Pain Location Back   Pain Orientation Right;Lower   Pain Descriptors /  Indicators Aching;Sore   Aggravating Factors  prolonged positions   Pain Relieving Factors ???            [facility] PT Assessment - 08/18/16 0001      Assessment   Medical Diagnosis Lumbar DDD    Referring Provider Dr. Dianah Field   Onset Date/Surgical Date 04/28/16   Hand Dominance Right           [facility] Adult PT Treatment/Exercise - 08/18/16 0001      Lumbar Exercises: Stretches   Passive Hamstring Stretch 2 reps;30 seconds  supine with strap, opp knee bent   ITB Stretch 2 reps;30 seconds  supine with strap   Piriformis Stretch 2 reps;30 seconds  travell      Lumbar Exercises: Aerobic   Stationary Bike NuStep L5: (arms - 10/legs) 6 min      Moist Heat Therapy    Number Minutes Moist Heat 15 Minutes   Moist Heat Location Lumbar Spine;Hip  bilat      Electrical Stimulation   Electrical Stimulation Location bilat lumbar   Electrical Stimulation Action IFC   Electrical Stimulation Parameters to tolerance    Electrical Stimulation Goals Pain     Manual Therapy   Manual therapy comments pt prone   difficulty tolerating position due to pain in cheek   Soft tissue mobilization bilat lumbar to piriformis/hip abductors           PT Long Term Goals - 08/11/16 1418      PT LONG TERM GOAL #1   Title Improve mobility and ROM bilat LE's 09/17/16   Time 6   Period Weeks   Status On-going     PT LONG TERM GOAL #2   Title 5/5 bilat LE's 09/17/16   Time 6   Period Weeks   Status On-going     PT LONG TERM GOAL #3   Title Patient reports and demonstrates increased activity tolerance with decreased pain 09/17/16   Time 6   Period Weeks   Status On-going     PT LONG TERM GOAL #4   Title Independent HEP 09/17/16   Time 6   Period Weeks   Status On-going     PT LONG TERM GOAL #5   Title Improve FOTO to </= 42% limitation 09/17/16   Time 6   Period Weeks   Status On-going               Plan - 08/18/16 1610    Clinical Impression Statement Pt reports minimal change in pain since DN and initiating therapy.  Pt reported decrease in pain by 2 points at end of session today. Encouraged pt to find his home TENS unit and try self massage with ball at home to help decrease pain and tightness.    Rehab Potential Good   PT Frequency 2x / week   PT Duration 6 weeks   PT Treatment/Interventions Patient/family education;ADLs/Self Care Home Management;Cryotherapy;Electrical Stimulation;Iontophoresis 4mg /ml Dexamethasone;Moist Heat;Ultrasound;Traction;Dry needling;Manual techniques;Therapeutic activities;Therapeutic exercise;Neuromuscular re-education   PT Next Visit Plan progress with stretching; core stabilization; strengthening; education in back care and  body mechanics. MMT LLE (goal#2)   Consulted and Agree with Plan of Care Patient      Patient will benefit from skilled therapeutic intervention in order to improve the following deficits and impairments:  Postural dysfunction, Improper body mechanics, Pain, Decreased range of motion, Decreased strength, Decreased mobility, Decreased activity tolerance  Visit Diagnosis: Chronic bilateral low back pain, with sciatica presence unspecified  Other symptoms and  signs involving the musculoskeletal system  Weakness generalized     Problem List Patient Active Problem List   Diagnosis Date Noted  . Lumbar degenerative disc disease 07/31/2016  . Chronic anticoagulation 06/04/2016  . Gross hematuria 03/17/2016  . Right calf pain   . DVT, lower extremity, proximal, acute, right ([place]) 02/13/2016  . DVT, lower extremity, distal, acute, right ([place]) 02/13/2016  . Bilateral pulmonary embolism ([place]) 02/11/2016  . Pulmonary embolism ([place]) 02/11/2016  . Shortness of breath 02/08/2016  . Hearing loss due to cerumen impaction, right 01/19/2015  . Acute bronchitis 01/15/2015  . Nephrolithiasis 04/19/2014  . Peripheral neuropathy ([place]) 12/15/2013  . Thrombosed external hemorrhoid s/p I&D [id number] 06/09/2012  . 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 IBS (irritable bowel syndrome) 03/09/2012  . Abdominal pain 05/19/2011  . 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 ABDOMINAL PAIN 11/23/2009  . GERD 10/26/2007  . DIVERTICULOSIS, COLON 10/25/2007  . RENAL CALCULUS 10/25/2007  . HEMORRHOIDS 11/24/2006  . Hyperlipidemia 11/23/2006  . Anxiety state 11/23/2006  . DEPRESSION 11/23/2006  . COLONIC POLYPS, HX OF 11/23/2006   Kerin Perna, PTA 08/18/16 4:17 PM  [facility] [place], [place], [address] Phone: [phone]   Fax:  [phone]  Name: David Armstrong MRN: [id number] Date of Birth: 1950-08-21

## 2016-08-21 ENCOUNTER — Ambulatory Visit (INDEPENDENT_AMBULATORY_CARE_PROVIDER_SITE_OTHER): Payer: Medicare Other | Admitting: Rehabilitative and Restorative Service Providers"

## 2016-08-21 ENCOUNTER — Encounter: Payer: Self-pay | Admitting: Rehabilitative and Restorative Service Providers"

## 2016-08-21 DIAGNOSIS — R531 Weakness: Secondary | ICD-10-CM

## 2016-08-21 DIAGNOSIS — R29898 Other symptoms and signs involving the musculoskeletal system: Secondary | ICD-10-CM

## 2016-08-21 DIAGNOSIS — G8929 Other chronic pain: Secondary | ICD-10-CM | POA: Diagnosis not present

## 2016-08-21 DIAGNOSIS — M545 Low back pain: Secondary | ICD-10-CM

## 2016-08-21 NOTE — Therapy (Addendum)
[place], [place], [address] Phone: [phone]   Fax:  [phone]  Physical Therapy Treatment  Patient Details  Name: David Armstrong MRN: [id number] Date of Birth: 03-15-1951 Referring Provider: Dr Dianah Field   Encounter Date: 08/21/2016      PT End of Session - 08/21/16 1425    Visit Number 5   Number of Visits 12   Date for PT Re-Evaluation 09/17/16   PT Start Time 6734   PT Stop Time 1937   PT Time Calculation (min) 55 min   Activity Tolerance Patient tolerated treatment well      Past Medical History:  Diagnosis Date  . Anal fissure   . ANXIETY   . Arthritis    "lower back; hips;' right knee" (02/11/2016)  . Bilateral pulmonary embolism ([place]) 02/11/2016  . Bipolar disorder ([place])   . Chronic constipation   . Chronic lower back pain   . DEPRESSION   . DIVERTICULOSIS, COLON [phone]   Colonoscopy  . DVT (deep venous thrombosis) ([place]) 02/11/2016   RLE  . Fatty liver   . GERD   . H. pylori infection   . Hemorrhoids [id number]   Colonoscopy   . Hiatal hernia [id number]   EGD  . History of kidney stones   . HYPERLIPIDEMIA   . IBS (irritable bowel syndrome)   . Internal hemorrhoids   . Peripheral neuropathy   . Personal history of colonic polyps 10/27/2007   HYPERPLASTIC POLYP  . Pneumonia 12/2014  . TMJ (temporomandibular joint syndrome)   . Unspecified gastritis and gastroduodenitis without mention of hemorrhage 2009   EGD    Past Surgical History:  Procedure Laterality Date  . BELPHAROPTOSIS REPAIR Bilateral 01/2015   "droopy eyelids"  . CYSTOSCOPY/RETROGRADE/URETEROSCOPY/STONE EXTRACTION WITH BASKET  1987; 1992; 1999; 2010  . EXCISIONAL HEMORRHOIDECTOMY    . FOOT NEUROMA SURGERY Left 09/2015  . FOOT NEUROMA SURGERY Right 07/2015  . FOOT NEUROMA SURGERY Right 2004   "between big and 2nd toes; shortened my 2nd toe"  . FRACTURE SURGERY    . HAMMER TOE SURGERY  Left    "little toe"  . INCISION AND DRAINAGE FOOT Left 1992   "ball of foot; stepped on piece of hard wire & it went thru my shoe"  . KNEE ARTHROSCOPY Bilateral   . LITHOTRIPSY  1992  . NASAL FRACTURE SURGERY  1997  . PARTIAL KNEE ARTHROPLASTY Right 06/2003  . PLANTAR FASCIA RELEASE Bilateral 1987-1993   right-left  . RHINOPLASTY  2000  . SHOULDER ARTHROSCOPY Left 03/2011; 2013  . SHOULDER ARTHROSCOPY Right 2007   "frozen shoulder"  . TOENAIL EXCISION Bilateral 2012   "big toes"    There were no vitals filed for this visit.      Subjective Assessment - 08/21/16 1423    Subjective Some improvement in back pain "a little better". Has not found his TENS unit yet but it does not feel lke the one in the clinic. Should have his strap(stretch out strap) today.    Currently in Pain? Yes   Pain Score 4    Pain Location Back   Pain Orientation Right;Lower   Pain Descriptors / Indicators Aching;Sore   Pain Type Chronic pain   Pain Onset More than a month ago   Pain Frequency Intermittent            [facility] PT Assessment - 08/21/16 0001      Assessment   Medical Diagnosis Lumbar DDD  Referring Provider Dr Dianah Field    Onset Date/Surgical Date 04/28/16   Hand Dominance Right     AROM   Lumbar Flexion 100%   Lumbar Extension 60%   Lumbar - Right Side Bend 75%   Lumbar - Left Side Bend 75%   Lumbar - Right Rotation 50%   Lumbar - Left Rotation 50%     Strength   Overall Strength Comments 5/5 bilat LE's except hip ext 5-/5 bilat                      [facility] Adult PT Treatment/Exercise - 08/21/16 0001      Lumbar Exercises: Stretches   Passive Hamstring Stretch 30 seconds;3 reps  supine with strap, opp knee bent   ITB Stretch 2 reps;30 seconds  supine with strap   Piriformis Stretch 2 reps;30 seconds  travell      Lumbar Exercises: Standing   Scapular Retraction Both;Strengthening;20 reps  isometric ER against TB attempting to engage core     Theraband Level (Scapular Retraction) Level 3 (Green)   Other Standing Lumbar Exercises hip abduction leading with heel green TB above knees 10 x 2 sets core engaged   Other Standing Lumbar Exercises hip extension green TB above knees 10 x 2 sets core engaged      Moist Heat Therapy   Number Minutes Moist Heat 20 Minutes   Moist Heat Location Lumbar Spine;Hip  bilat      Acupuncturist Location bilat lumbar   Electrical Stimulation Action IFC   Electrical Stimulation Parameters to tolerance   Electrical Stimulation Goals Pain;Tone     Manual Therapy   Manual therapy comments pt prone    Joint Mobilization lumbar CPA Grade II/III   Soft tissue mobilization bilat lumbar to piriformis/hip abductors   Myofascial Release lumbar/hips                 PT Education - 08/21/16 1430    Education provided Yes   Education Details HEP; TENS    Person(s) Educated Patient   Methods Explanation;Demonstration;Tactile cues;Verbal cues;Handout   Comprehension Verbalized understanding;Returned demonstration;Verbal cues required;Tactile cues required             PT Long Term Goals - 08/21/16 1410      PT LONG TERM GOAL #1   Title Improve mobility and ROM bilat LE's 09/17/16   Time 6   Period Weeks   Status On-going     PT LONG TERM GOAL #2   Title 5/5 bilat LE's 09/17/16   Time 6   Period Weeks   Status On-going     PT LONG TERM GOAL #3   Title Patient reports and demonstrates increased activity tolerance with decreased pain 09/17/16   Time 6   Period Weeks   Status On-going     PT LONG TERM GOAL #4   Title Independent HEP 09/17/16   Time 6   Period Weeks   Status On-going     PT LONG TERM GOAL #5   Title Improve FOTO to </= 42% limitation 09/17/16   Time 6   Period Weeks   Status On-going               Plan - 08/21/16 1411    Clinical Impression Statement Back may be feeling "a little bit better" less pain into the Lt thigh. David Armstrong  continues to have decreased lumbar mobilty; decresed core stability; limited functional activity level. David Armstrong demonstrates  increased strength bilat hips. He is making gradual progress toward stated goals of therapy.    Rehab Potential Good   PT Frequency 2x / week   PT Duration 6 weeks   PT Treatment/Interventions Patient/family education;ADLs/Self Care Home Management;Cryotherapy;Electrical Stimulation;Iontophoresis 4mg /ml Dexamethasone;Moist Heat;Ultrasound;Traction;Dry needling;Manual techniques;Therapeutic activities;Therapeutic exercise;Neuromuscular re-education   PT Next Visit Plan progress with stretching; core stabilization; strengthening; education in back care and body mechanics.    Consulted and Agree with Plan of Care Patient      Patient will benefit from skilled therapeutic intervention in order to improve the following deficits and impairments:  Postural dysfunction, Improper body mechanics, Pain, Decreased range of motion, Decreased strength, Decreased mobility, Decreased activity tolerance  Visit Diagnosis: Chronic bilateral low back pain, with sciatica presence unspecified  Other symptoms and signs involving the musculoskeletal system  Weakness generalized     Problem List Patient Active Problem List   Diagnosis Date Noted  . Lumbar degenerative disc disease 07/31/2016  . Chronic anticoagulation 06/04/2016  . Gross hematuria 03/17/2016  . Right calf pain   . DVT, lower extremity, proximal, acute, right ([place]) 02/13/2016  . DVT, lower extremity, distal, acute, right ([place]) 02/13/2016  . Bilateral pulmonary embolism ([place]) 02/11/2016  . Pulmonary embolism ([place]) 02/11/2016  . Shortness of breath 02/08/2016  . Hearing loss due to cerumen impaction, right 01/19/2015  . Acute bronchitis 01/15/2015  . Nephrolithiasis 04/19/2014  . Peripheral neuropathy ([place]) 12/15/2013  . Thrombosed external hemorrhoid s/p I&D [id number] 06/09/2012  . Chronic idiopathic anal pain 03/09/2012   . IBS (irritable bowel syndrome) 03/09/2012  . Abdominal pain 05/19/2011  . 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 ABDOMINAL PAIN 11/23/2009  . GERD 10/26/2007  . DIVERTICULOSIS, COLON 10/25/2007  . RENAL CALCULUS 10/25/2007  . HEMORRHOIDS 11/24/2006  . Hyperlipidemia 11/23/2006  . Anxiety state 11/23/2006  . DEPRESSION 11/23/2006  . COLONIC POLYPS, HX OF 11/23/2006    David Armstrong David Armstrong PT, MPH  08/21/2016, 2:50 PM  [facility] [place], [place], [address] Phone: [phone]   Fax:  [phone]  Name: David Armstrong MRN: [id number] Date of Birth: 10/29/1950

## 2016-08-21 NOTE — Patient Instructions (Addendum)
Strengthening: Hip Abduction - Resisted    With tubing around right leg, other side toward anchor, extend leg out from side. Repeat __10__ times per set. Do __2-3__ sets per session. Do __2__ sessions per day.   Strengthening: Hip Extension - Resisted    With tubing around right ankle, face anchor and pull leg straight back. Repeat _10___ times per set. Do _2-3___ sets per session. Do __2__ sessions per day.  Stand with core tight - green band around wrist. Tense band Hold 5 sec while you exhale. Repeat  x 10 -20 reps    TENS UNIT: This is helpful for muscle pain and spasm.   Search and Purchase a TENS 7000 2nd edition at www.tenspros.com. It should be less than $30.     TENS unit instructions: Do not shower or bathe with the unit on Turn the unit off before removing electrodes or batteries If the electrodes lose stickiness add a drop of water to the electrodes after they are disconnected from the unit and place on plastic sheet. If you continued to have difficulty, call the TENS unit company to purchase more electrodes. Do not apply lotion on the skin area prior to use. Make sure the skin is clean and dry as this will help prolong the life of the electrodes. After use, always check skin for unusual red areas, rash or other skin difficulties. If there are any skin problems, does not apply electrodes to the same area. Never remove the electrodes from the unit by pulling the wires. Do not use the TENS unit or electrodes other than as directed. Do not change electrode placement without consultating your therapist or physician. Keep 2 fingers with between each electrode.

## 2016-08-25 ENCOUNTER — Ambulatory Visit (INDEPENDENT_AMBULATORY_CARE_PROVIDER_SITE_OTHER): Payer: Medicare Other | Admitting: Physical Therapy

## 2016-08-25 DIAGNOSIS — R531 Weakness: Secondary | ICD-10-CM | POA: Diagnosis not present

## 2016-08-25 DIAGNOSIS — G8929 Other chronic pain: Secondary | ICD-10-CM

## 2016-08-25 DIAGNOSIS — R29898 Other symptoms and signs involving the musculoskeletal system: Secondary | ICD-10-CM | POA: Diagnosis not present

## 2016-08-25 DIAGNOSIS — M545 Low back pain: Secondary | ICD-10-CM | POA: Diagnosis not present

## 2016-08-25 NOTE — Therapy (Signed)
[place], [place], [address] Phone: [phone]   Fax:  [phone]  Physical Therapy Treatment  Patient Details  Name: David Armstrong MRN: [id number] Date of Birth: 11/12/1950 Referring Provider: Dr. Dianah Field  Encounter Date: 08/25/2016      PT End of Session - 08/25/16 1529    Visit Number 6   Number of Visits 12   Date for PT Re-Evaluation 09/17/16   PT Start Time 0321   PT Stop Time 1611   PT Time Calculation (min) 54 min   Activity Tolerance Patient tolerated treatment well;No increased pain   Behavior During Therapy WFL for tasks assessed/performed      Past Medical History:  Diagnosis Date  . Anal fissure   . ANXIETY   . Arthritis    "lower back; hips;' right knee" (02/11/2016)  . Bilateral pulmonary embolism ([facility]) 02/11/2016  . Bipolar disorder ([place])   . Chronic constipation   . Chronic lower back pain   . DEPRESSION   . DIVERTICULOSIS, COLON [phone]   Colonoscopy  . DVT (deep venous thrombosis) ([facility]) 02/11/2016   RLE  . Fatty liver   . GERD   . H. pylori infection   . Hemorrhoids [id number]   Colonoscopy   . Hiatal hernia [id number]   EGD  . History of kidney stones   . HYPERLIPIDEMIA   . IBS (irritable bowel syndrome)   . Internal hemorrhoids   . Peripheral neuropathy   . Personal history of colonic polyps 10/27/2007   HYPERPLASTIC POLYP  . Pneumonia 12/2014  . TMJ (temporomandibular joint syndrome)   . Unspecified gastritis and gastroduodenitis without mention of hemorrhage 2009   EGD    Past Surgical History:  Procedure Laterality Date  . BELPHAROPTOSIS REPAIR Bilateral 01/2015   "droopy eyelids"  . CYSTOSCOPY/RETROGRADE/URETEROSCOPY/STONE EXTRACTION WITH BASKET  1987; 1992; 1999; 2010  . EXCISIONAL HEMORRHOIDECTOMY    . FOOT NEUROMA SURGERY Left 09/2015  . FOOT NEUROMA SURGERY Right 07/2015  . FOOT NEUROMA SURGERY Right 2004   "between big and  2nd toes; shortened my 2nd toe"  . FRACTURE SURGERY    . HAMMER TOE SURGERY Left    "little toe"  . INCISION AND DRAINAGE FOOT Left 1992   "ball of foot; stepped on piece of hard wire & it went thru my shoe"  . KNEE ARTHROSCOPY Bilateral   . LITHOTRIPSY  1992  . NASAL FRACTURE SURGERY  1997  . PARTIAL KNEE ARTHROPLASTY Right 06/2003  . PLANTAR FASCIA RELEASE Bilateral 1987-1993   right-left  . RHINOPLASTY  2000  . SHOULDER ARTHROSCOPY Left 03/2011; 2013  . SHOULDER ARTHROSCOPY Right 2007   "frozen shoulder"  . TOENAIL EXCISION Bilateral 2012   "big toes"    There were no vitals filed for this visit.      Subjective Assessment - 08/25/16 1522    Subjective Pt received stretch out strap; has used it once since then. He complains of persistent stiffness in his low back.  The Rt hip pain has decreased some since initiating therapy.   He is having surgery on tooth tomorrow.    Pertinent History Pheripherial neuropathy 2009; lumbar DDD for several years; partial Rt knee replacement 2005; Lt knee surgery 1997; 3 shoulder surgeries; 6 foot surgeries; DVT Rt LE 2017   Currently in Pain? Yes   Pain Score 5    Pain Location Back   Pain Orientation Right;Left;Lower   Pain Descriptors / Indicators Aching;Sore  Aggravating Factors  prolonged positions    Pain Relieving Factors ??            [facility] PT Assessment - 08/25/16 0001      Assessment   Medical Diagnosis Lumbar DDD    Referring Provider Dr. Dianah Field   Onset Date/Surgical Date 04/28/16   Hand Dominance Right   Next MD Visit none scheduled          [facility] Adult PT Treatment/Exercise - 08/25/16 0001      Exercises   Exercises Knee/Hip     Lumbar Exercises: Stretches   Passive Hamstring Stretch 30 seconds;3 reps  supine with strap, opp knee bent   ITB Stretch 2 reps;30 seconds  supine with strap   Piriformis Stretch 2 reps;30 seconds  travell      Lumbar Exercises: Aerobic   Stationary Bike NuStep L5: (arms -  10/legs) 6 min      Knee/Hip Exercises: Stretches   Hip Flexor Stretch Right;Left;2 reps;30 seconds  seated, leg off of chair.    Gastroc Stretch Right;Left;3 reps;30 seconds     Moist Heat Therapy   Number Minutes Moist Heat 15 Minutes   Moist Heat Location Lumbar Spine;Hip  bilat      Electrical Stimulation   Electrical Stimulation Location bilat lumbar paraspinals    Electrical Stimulation Action IFC   Electrical Stimulation Parameters  to tolerance   Electrical Stimulation Goals Pain     Manual Therapy   Manual therapy comments pt prone    Joint Mobilization lumbar CPA Grade II/III, bilat lumbar lateral glides (entire lumbar spine).   performed by Gillermo Murdoch, PT   Soft tissue mobilization bilat lumbar paraspinals.    Myofascial Release lumbar/bilat hips                      PT Long Term Goals - 08/21/16 1410      PT LONG TERM GOAL #1   Title Improve mobility and ROM bilat LE's 09/17/16   Time 6   Period Weeks   Status On-going     PT LONG TERM GOAL #2   Title 5/5 bilat LE's 09/17/16   Time 6   Period Weeks   Status On-going     PT LONG TERM GOAL #3   Title Patient reports and demonstrates increased activity tolerance with decreased pain 09/17/16   Time 6   Period Weeks   Status On-going     PT LONG TERM GOAL #4   Title Independent HEP 09/17/16   Time 6   Period Weeks   Status On-going     PT LONG TERM GOAL #5   Title Improve FOTO to </= 42% limitation 09/17/16   Time 6   Period Weeks   Status On-going               Plan - 08/25/16 1529    Clinical Impression Statement Pt reports he is 25-30% better since initiating therapy. Mobility in lumbar spine similar to last visit.   Pt reports decrease in LBP with LE stretches, further reduction of pain with MHP and estim at end of session.  Pt making gradual progress towards established goals.    Rehab Potential Good   PT Frequency 2x / week   PT Duration 6 weeks   PT Treatment/Interventions  Patient/family education;ADLs/Self Care Home Management;Cryotherapy;Electrical Stimulation;Iontophoresis 4mg /ml Dexamethasone;Moist Heat;Ultrasound;Traction;Dry needling;Manual techniques;Therapeutic activities;Therapeutic exercise;Neuromuscular re-education   PT Next Visit Plan progress with stretching; core stabilization; strengthening; education in back  care and body mechanics.    Consulted and Agree with Plan of Care Patient      Patient will benefit from skilled therapeutic intervention in order to improve the following deficits and impairments:  Postural dysfunction, Improper body mechanics, Pain, Decreased range of motion, Decreased strength, Decreased mobility, Decreased activity tolerance  Visit Diagnosis: Chronic bilateral low back pain, with sciatica presence unspecified  Other symptoms and signs involving the musculoskeletal system  Weakness generalized     Problem List Patient Active Problem List   Diagnosis Date Noted  . Lumbar degenerative disc disease 07/31/2016  . Chronic anticoagulation 06/04/2016  . Gross hematuria 03/17/2016  . Right calf pain   . DVT, lower extremity, proximal, acute, right ([facility]) 02/13/2016  . DVT, lower extremity, distal, acute, right ([place]) 02/13/2016  . Bilateral pulmonary embolism ([facility]) 02/11/2016  . Pulmonary embolism ([place]) 02/11/2016  . Shortness of breath 02/08/2016  . Hearing loss due to cerumen impaction, right 01/19/2015  . Acute bronchitis 01/15/2015  . Nephrolithiasis 04/19/2014  . Peripheral neuropathy ([facility]) 12/15/2013  . Thrombosed external hemorrhoid s/p I&D [id number] 06/09/2012  . 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 IBS (irritable bowel syndrome) 03/09/2012  . Abdominal pain 05/19/2011  . 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 ABDOMINAL PAIN 11/23/2009  . GERD 10/26/2007  . DIVERTICULOSIS, COLON 10/25/2007  . RENAL CALCULUS 10/25/2007  . HEMORRHOIDS 11/24/2006  . Hyperlipidemia 11/23/2006  . Anxiety state  11/23/2006  . DEPRESSION 11/23/2006  . COLONIC POLYPS, HX OF 11/23/2006   Kerin Perna, PTA 08/25/16 3:58 PM  Celyn P. Helene Kelp PT, MPH 08/25/16 4:00 PM   [facility] [place], [place], [address] Phone: [phone]   Fax:  [phone]  Name: David Armstrong MRN: [id number] Date of Birth: October 06, 1950

## 2016-08-29 ENCOUNTER — Ambulatory Visit (INDEPENDENT_AMBULATORY_CARE_PROVIDER_SITE_OTHER): Payer: Medicare Other | Admitting: Rehabilitative and Restorative Service Providers"

## 2016-08-29 ENCOUNTER — Encounter: Payer: Self-pay | Admitting: Rehabilitative and Restorative Service Providers"

## 2016-08-29 DIAGNOSIS — M545 Low back pain: Secondary | ICD-10-CM | POA: Diagnosis not present

## 2016-08-29 DIAGNOSIS — R531 Weakness: Secondary | ICD-10-CM

## 2016-08-29 DIAGNOSIS — R29898 Other symptoms and signs involving the musculoskeletal system: Secondary | ICD-10-CM

## 2016-08-29 DIAGNOSIS — G8929 Other chronic pain: Secondary | ICD-10-CM

## 2016-08-29 NOTE — Therapy (Signed)
[place], [place], [address] Phone: [phone]   Fax:  [phone]  Physical Therapy Treatment  Patient Details  Name: David Armstrong MRN: [id number] Date of Birth: 1950-11-01 Referring Provider: Dr. Dianah Field  Encounter Date: 08/29/2016      PT End of Session - 08/29/16 1410    Visit Number 7   Number of Visits 12   Date for PT Re-Evaluation 09/17/16   PT Start Time 1402   PT Stop Time 1448   PT Time Calculation (min) 46 min   Activity Tolerance Patient tolerated treatment well      Past Medical History:  Diagnosis Date  . Anal fissure   . ANXIETY   . Arthritis    "lower back; hips;' right knee" (02/11/2016)  . Bilateral pulmonary embolism ([place]) 02/11/2016  . Bipolar disorder ([place])   . Chronic constipation   . Chronic lower back pain   . DEPRESSION   . DIVERTICULOSIS, COLON [phone]   Colonoscopy  . DVT (deep venous thrombosis) ([place]) 02/11/2016   RLE  . Fatty liver   . GERD   . H. pylori infection   . Hemorrhoids [id number]   Colonoscopy   . Hiatal hernia [id number]   EGD  . History of kidney stones   . HYPERLIPIDEMIA   . IBS (irritable bowel syndrome)   . Internal hemorrhoids   . Peripheral neuropathy   . Personal history of colonic polyps 10/27/2007   HYPERPLASTIC POLYP  . Pneumonia 12/2014  . TMJ (temporomandibular joint syndrome)   . Unspecified gastritis and gastroduodenitis without mention of hemorrhage 2009   EGD    Past Surgical History:  Procedure Laterality Date  . BELPHAROPTOSIS REPAIR Bilateral 01/2015   "droopy eyelids"  . CYSTOSCOPY/RETROGRADE/URETEROSCOPY/STONE EXTRACTION WITH BASKET  1987; 1992; 1999; 2010  . EXCISIONAL HEMORRHOIDECTOMY    . FOOT NEUROMA SURGERY Left 09/2015  . FOOT NEUROMA SURGERY Right 07/2015  . FOOT NEUROMA SURGERY Right 2004   "between big and 2nd toes; shortened my 2nd toe"  . FRACTURE SURGERY    . HAMMER TOE SURGERY  Left    "little toe"  . INCISION AND DRAINAGE FOOT Left 1992   "ball of foot; stepped on piece of hard wire & it went thru my shoe"  . KNEE ARTHROSCOPY Bilateral   . LITHOTRIPSY  1992  . NASAL FRACTURE SURGERY  1997  . PARTIAL KNEE ARTHROPLASTY Right 06/2003  . PLANTAR FASCIA RELEASE Bilateral 1987-1993   right-left  . RHINOPLASTY  2000  . SHOULDER ARTHROSCOPY Left 03/2011; 2013  . SHOULDER ARTHROSCOPY Right 2007   "frozen shoulder"  . TOENAIL EXCISION Bilateral 2012   "big toes"    There were no vitals filed for this visit.      Subjective Assessment - 08/29/16 1417    Subjective Surgery was "rough" and he continues to have some pain. Wants to "take it easy" in therpay today. Noticed that he has had less back pain in his back when he stopped the zeralto for 4-5 days. He will contact MD about changing the medication.    Currently in Pain? Yes   Pain Score 5    Pain Location Back   Pain Orientation Right;Left;Lower   Pain Descriptors / Indicators Aching;Sore   Pain Type Chronic pain                         [facility] Adult PT Treatment/Exercise - 08/29/16 0001  Lumbar Exercises: Aerobic   Stationary Bike NuStep L6: (arms - 10/legs) 8 min      Lumbar Exercises: Standing   Other Standing Lumbar Exercises hip abduction leading with heel green TB above knees 10 x 2 sets core engaged   Other Standing Lumbar Exercises hip extension green TB above knees 10 x 2 sets core engaged      Knee/Hip Exercises: Stretches   Hip Flexor Stretch Right;Left;2 reps;30 seconds  seated, leg off of chair.    Gastroc Stretch Right;Left;3 reps;30 seconds     Moist Heat Therapy   Number Minutes Moist Heat 15 Minutes   Moist Heat Location Lumbar Spine;Hip  bilat      Electrical Stimulation   Electrical Stimulation Location bilat lumbar paraspinals    Electrical Stimulation Action IFC   Electrical Stimulation Parameters to tolerance    Electrical Stimulation Goals Pain                 PT Education - 08/29/16 1426    Education provided Yes   Education Details encouraged pt to contact MD about medication    Person(s) Educated Patient   Methods Explanation   Comprehension Verbalized understanding             PT Long Term Goals - 08/29/16 1411      PT LONG TERM GOAL #1   Title Improve mobility and ROM bilat LE's 09/17/16   Time 6   Period Weeks   Status On-going     PT LONG TERM GOAL #2   Title 5/5 bilat LE's 09/17/16   Time 6   Period Weeks   Status On-going     PT LONG TERM GOAL #3   Title Patient reports and demonstrates increased activity tolerance with decreased pain 09/17/16   Time 6   Period Weeks   Status On-going     PT LONG TERM GOAL #4   Title Independent HEP 09/17/16   Time 6   Period Weeks   Status On-going     PT LONG TERM GOAL #5   Title Improve FOTO to </= 42% limitation 09/17/16   Time 6   Period Weeks   Status On-going               Plan - 08/29/16 1413    Clinical Impression Statement Patient reports that the sinus/tooth surgery has been difficult. Has noticed that his back is not as sore when he was off zeralto for 4 to 5 days. He will contact MD to discuss changing medication. He has not done exercises because of not feeling well. Tolerated modified exercise program without difficulty today. Wil progress exercise next week as tolerated.     Rehab Potential Good   PT Frequency 2x / week   PT Duration 6 weeks   PT Treatment/Interventions Patient/family education;ADLs/Self Care Home Management;Cryotherapy;Electrical Stimulation;Iontophoresis 4mg /ml Dexamethasone;Moist Heat;Ultrasound;Traction;Dry needling;Manual techniques;Therapeutic activities;Therapeutic exercise;Neuromuscular re-education   PT Next Visit Plan progress with stretching; core stabilization; strengthening; education in back care and body mechanics.       Patient will benefit from skilled therapeutic intervention in order to improve the  following deficits and impairments:  Postural dysfunction, Improper body mechanics, Pain, Decreased range of motion, Decreased strength, Decreased mobility, Decreased activity tolerance  Visit Diagnosis: Chronic bilateral low back pain, with sciatica presence unspecified  Other symptoms and signs involving the musculoskeletal system  Weakness generalized     Problem List Patient Active Problem List   Diagnosis Date Noted  . Lumbar  degenerative disc disease 07/31/2016  . Chronic anticoagulation 06/04/2016  . Gross hematuria 03/17/2016  . Right calf pain   . DVT, lower extremity, proximal, acute, right ([place]) 02/13/2016  . DVT, lower extremity, distal, acute, right ([place]) 02/13/2016  . Bilateral pulmonary embolism ([facility] [place]) 02/11/2016  . Pulmonary embolism ([place]) 02/11/2016  . Shortness of breath 02/08/2016  . Hearing loss due to cerumen impaction, right 01/19/2015  . Acute bronchitis 01/15/2015  . Nephrolithiasis 04/19/2014  . Peripheral neuropathy ([place] [facility]) 12/15/2013  . Thrombosed external hemorrhoid s/p I&D [id number] 06/09/2012  . 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 IBS (irritable bowel syndrome) 03/09/2012  . Abdominal pain 05/19/2011  . 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 ABDOMINAL PAIN 11/23/2009  . GERD 10/26/2007  . DIVERTICULOSIS, COLON 10/25/2007  . RENAL CALCULUS 10/25/2007  . HEMORRHOIDS 11/24/2006  . Hyperlipidemia 11/23/2006  . Anxiety state 11/23/2006  . DEPRESSION 11/23/2006  . COLONIC POLYPS, HX OF 11/23/2006    Mikela Senn Nilda Simmer PT, MPH  08/29/2016, 2:27 PM  [facility] [place], [place], [address] Phone: [phone]   Fax:  [phone]  Name: David Armstrong MRN: [id number] Date of Birth: [birth year]

## 2016-09-01 ENCOUNTER — Ambulatory Visit (INDEPENDENT_AMBULATORY_CARE_PROVIDER_SITE_OTHER): Payer: Medicare Other | Admitting: Physical Therapy

## 2016-09-01 DIAGNOSIS — G8929 Other chronic pain: Secondary | ICD-10-CM

## 2016-09-01 DIAGNOSIS — R29898 Other symptoms and signs involving the musculoskeletal system: Secondary | ICD-10-CM | POA: Diagnosis not present

## 2016-09-01 DIAGNOSIS — Z961 Presence of intraocular lens: Secondary | ICD-10-CM | POA: Diagnosis not present

## 2016-09-01 DIAGNOSIS — H35371 Puckering of macula, right eye: Secondary | ICD-10-CM | POA: Diagnosis not present

## 2016-09-01 DIAGNOSIS — M545 Low back pain: Secondary | ICD-10-CM | POA: Diagnosis not present

## 2016-09-01 DIAGNOSIS — R531 Weakness: Secondary | ICD-10-CM | POA: Diagnosis not present

## 2016-09-01 DIAGNOSIS — H01009 Unspecified blepharitis unspecified eye, unspecified eyelid: Secondary | ICD-10-CM | POA: Diagnosis not present

## 2016-09-01 NOTE — Patient Instructions (Signed)
  Abdominal Bracing With Pelvic Floor (Hook-Lying)   With neutral spine, tighten pelvic floor and abdominals. Hold 10 seconds. Repeat __10_ times. Do _1__ times a day.   Knee to Chest: Transverse Plane Stability   Bring one knee up, then return. Be sure pelvis does not roll side to side. Keep pelvis still. Lift knee __10_ times each leg. Restabilize pelvis. Repeat with other leg. Do _1-2__ sets, _1__ times per day.   Hip External Rotation With Pillow: Transverse Plane Stability   One knee bent, one leg straight, on pillow. Slowly roll bent knee out. Be sure pelvis does not rotate. Do _10__ times. Restabilize pelvis. Repeat with other leg. Do _1-2__ sets, _1__ times per day.   [facility] [address] [place],  [address]  [phone] (office) [phone] (fax)   

## 2016-09-01 NOTE — Therapy (Signed)
[place], [place], [address] Phone: [phone]   Fax:  [phone]  Physical Therapy Treatment  Patient Details  Name: David Armstrong MRN: [id number] Date of Birth: March 18, 1951 Referring Provider: Dr. Dianah Field  Encounter Date: 09/01/2016      PT End of Session - 09/01/16 1407    Visit Number 8   Number of Visits 12   Date for PT Re-Evaluation 09/17/16   PT Start Time 8250   PT Stop Time 1448   PT Time Calculation (min) 45 min   Activity Tolerance Patient tolerated treatment well   Behavior During Therapy [facility] for tasks assessed/performed      Past Medical History:  Diagnosis Date  . Anal fissure   . ANXIETY   . Arthritis    "lower back; hips;' right knee" (02/11/2016)  . Bilateral pulmonary embolism ([place]) 02/11/2016  . Bipolar disorder ([place])   . Chronic constipation   . Chronic lower back pain   . DEPRESSION   . DIVERTICULOSIS, COLON [phone]   Colonoscopy  . DVT (deep venous thrombosis) ([place]) 02/11/2016   RLE  . Fatty liver   . GERD   . H. pylori infection   . Hemorrhoids [id number]   Colonoscopy   . Hiatal hernia [id number]   EGD  . History of kidney stones   . HYPERLIPIDEMIA   . IBS (irritable bowel syndrome)   . Internal hemorrhoids   . Peripheral neuropathy   . Personal history of colonic polyps 10/27/2007   HYPERPLASTIC POLYP  . Pneumonia 12/2014  . TMJ (temporomandibular joint syndrome)   . Unspecified gastritis and gastroduodenitis without mention of hemorrhage 2009   EGD    Past Surgical History:  Procedure Laterality Date  . BELPHAROPTOSIS REPAIR Bilateral 01/2015   "droopy eyelids"  . CYSTOSCOPY/RETROGRADE/URETEROSCOPY/STONE EXTRACTION WITH BASKET  1987; 1992; 1999; 2010  . EXCISIONAL HEMORRHOIDECTOMY    . FOOT NEUROMA SURGERY Left 09/2015  . FOOT NEUROMA SURGERY Right 07/2015  . FOOT NEUROMA SURGERY Right 2004   "between big and 2nd toes;  shortened my 2nd toe"  . FRACTURE SURGERY    . HAMMER TOE SURGERY Left    "little toe"  . INCISION AND DRAINAGE FOOT Left 1992   "ball of foot; stepped on piece of hard wire & it went thru my shoe"  . KNEE ARTHROSCOPY Bilateral   . LITHOTRIPSY  1992  . NASAL FRACTURE SURGERY  1997  . PARTIAL KNEE ARTHROPLASTY Right 06/2003  . PLANTAR FASCIA RELEASE Bilateral 1987-1993   right-left  . RHINOPLASTY  2000  . SHOULDER ARTHROSCOPY Left 03/2011; 2013  . SHOULDER ARTHROSCOPY Right 2007   "frozen shoulder"  . TOENAIL EXCISION Bilateral 2012   "big toes"    There were no vitals filed for this visit.      Subjective Assessment - 09/01/16 1405    Subjective Pt states he is still recovering from oral surgery.  He thinks he may be having a reaction to the antibiotic.  He started back on Zorelto and his back pain has started to return.  His back pain comes and goes; today is not too bad.     Currently in Pain? Yes   Pain Score 3    Pain Location Back   Pain Orientation Right;Lower   Pain Descriptors / Indicators Dull;Aching;Sore   Aggravating Factors  prolonged positions.    Pain Relieving Factors ??            [facility] PT  Assessment - 09/01/16 0001      Assessment   Medical Diagnosis Lumbar DDD    Referring Provider Dr. Dianah Field   Onset Date/Surgical Date 04/28/16   Hand Dominance Right   Next MD Visit none scheduled          [facility] Adult PT Treatment/Exercise - 09/01/16 0001      Lumbar Exercises: Stretches   Passive Hamstring Stretch 2 reps;30 seconds   Single Knee to Chest Stretch 2 reps;20 seconds   Lower Trunk Rotation 2 reps;60 seconds     Lumbar Exercises: Aerobic   Stationary Bike NuStep L5: (arms - 9/legs) 5 min      Lumbar Exercises: Supine   Clam 10 reps;2 seconds  with ab set   Bent Knee Raise 15 reps;2 seconds  with ab set   Bridge 15 reps  core engaged.      Lumbar Exercises: Prone   Other Prone Lumbar Exercises Pt declined performing exercises in  this position due to sinus pain and "neck issues".      Moist Heat Therapy   Number Minutes Moist Heat 15 Minutes   Moist Heat Location Lumbar Spine;Hip  bilat      Electrical Stimulation   Electrical Stimulation Location bilat lumbar paraspinals    Electrical Stimulation Action IFC   Electrical Stimulation Parameters to tolerance    Electrical Stimulation Goals Pain                PT Education - 09/01/16 1652    Education provided Yes   Education Details Encouraged pt to contact MD regarding medication.  HEP- handout.    Person(s) Educated Patient   Methods Explanation;Handout;Demonstration;Tactile cues;Verbal cues   Comprehension Verbalized understanding;Returned demonstration             PT Long Term Goals - 09/01/16 1424      PT LONG TERM GOAL #1   Title Improve mobility and ROM bilat LE's 09/17/16   Time 6   Period Weeks   Status On-going     PT LONG TERM GOAL #2   Title 5/5 bilat LE's 09/17/16   Time 6   Period Weeks   Status On-going     PT LONG TERM GOAL #3   Title Patient reports and demonstrates increased activity tolerance with decreased pain 09/17/16   Time 6   Period Weeks   Status On-going     PT LONG TERM GOAL #4   Title Independent HEP 09/17/16   Time 6   Period Weeks   Status On-going     PT LONG TERM GOAL #5   Title Improve FOTO to </= 42% limitation 09/17/16   Time 6   Period Weeks   Status On-going               Plan - 09/01/16 1652    Clinical Impression Statement Pt tolerated all exercises without increase in symptoms.  Pt declined to exercises in prone position due to sinus/facial pressure/"neck issues".   Limited progress due to most recent surgery.   No new goals met this visit.     Rehab Potential Good   PT Frequency 2x / week   PT Duration 6 weeks   PT Treatment/Interventions Patient/family education;ADLs/Self Care Home Management;Cryotherapy;Electrical Stimulation;Iontophoresis 47m/ml Dexamethasone;Moist  Heat;Ultrasound;Traction;Dry needling;Manual techniques;Therapeutic activities;Therapeutic exercise;Neuromuscular re-education   PT Next Visit Plan progress with stretching; core stabilization; strengthening; manual therapy to low back/hips.    Consulted and Agree with Plan of Care Patient  Patient will benefit from skilled therapeutic intervention in order to improve the following deficits and impairments:  Postural dysfunction, Improper body mechanics, Pain, Decreased range of motion, Decreased strength, Decreased mobility, Decreased activity tolerance  Visit Diagnosis: Chronic bilateral low back pain, with sciatica presence unspecified  Other symptoms and signs involving the musculoskeletal system  Weakness generalized     Problem List Patient Active Problem List   Diagnosis Date Noted  . Lumbar degenerative disc disease 07/31/2016  . Chronic anticoagulation 06/04/2016  . Gross hematuria 03/17/2016  . Right calf pain   . DVT, lower extremity, proximal, acute, right ([place]) 02/13/2016  . DVT, lower extremity, distal, acute, right ([place]) 02/13/2016  . Bilateral pulmonary embolism ([place]) 02/11/2016  . Pulmonary embolism ([place]) 02/11/2016  . Shortness of breath 02/08/2016  . Hearing loss due to cerumen impaction, right 01/19/2015  . Acute bronchitis 01/15/2015  . Nephrolithiasis 04/19/2014  . Peripheral neuropathy ([place]) 12/15/2013  . Thrombosed external hemorrhoid s/p I&D [id number] 06/09/2012  . 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 IBS (irritable bowel syndrome) 03/09/2012  . Abdominal pain 05/19/2011  . 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 ABDOMINAL PAIN 11/23/2009  . GERD 10/26/2007  . DIVERTICULOSIS, COLON 10/25/2007  . RENAL CALCULUS 10/25/2007  . HEMORRHOIDS 11/24/2006  . Hyperlipidemia 11/23/2006  . Anxiety state 11/23/2006  . DEPRESSION 11/23/2006  . COLONIC POLYPS, HX OF 11/23/2006   Kerin Perna, PTA 09/01/16 5:00 PM  [place], [place], [address] Phone: [phone]   Fax:  [phone]  Name: David Armstrong MRN: [id number] Date of Birth: January 20, 1951

## 2016-09-04 ENCOUNTER — Ambulatory Visit (INDEPENDENT_AMBULATORY_CARE_PROVIDER_SITE_OTHER): Payer: Medicare Other | Admitting: Physical Therapy

## 2016-09-04 DIAGNOSIS — G8929 Other chronic pain: Secondary | ICD-10-CM | POA: Diagnosis not present

## 2016-09-04 DIAGNOSIS — R531 Weakness: Secondary | ICD-10-CM

## 2016-09-04 DIAGNOSIS — N509 Disorder of male genital organs, unspecified: Secondary | ICD-10-CM | POA: Diagnosis not present

## 2016-09-04 DIAGNOSIS — R29898 Other symptoms and signs involving the musculoskeletal system: Secondary | ICD-10-CM | POA: Diagnosis not present

## 2016-09-04 DIAGNOSIS — M545 Low back pain: Secondary | ICD-10-CM | POA: Diagnosis not present

## 2016-09-04 NOTE — Therapy (Signed)
[place], [place], [address] Phone: [phone]   Fax:  [phone]  Physical Therapy Treatment  Patient Details  Name: David Armstrong MRN: [id number] Date of Birth: Oct 03, 1950 Referring Provider: Dr. Dianah Field  Encounter Date: 09/04/2016      PT End of Session - 09/04/16 1406    Visit Number 9   Number of Visits 12   Date for PT Re-Evaluation 09/17/16   PT Start Time 1402   PT Stop Time 1504   PT Time Calculation (min) 62 min   Activity Tolerance Patient tolerated treatment well   Behavior During Therapy [facility] for tasks assessed/performed      Past Medical History:  Diagnosis Date  . Anal fissure   . ANXIETY   . Arthritis    "lower back; hips;' right knee" (02/11/2016)  . Bilateral pulmonary embolism ([place]) 02/11/2016  . Bipolar disorder ([place])   . Chronic constipation   . Chronic lower back pain   . DEPRESSION   . DIVERTICULOSIS, COLON [phone]   Colonoscopy  . DVT (deep venous thrombosis) ([place]) 02/11/2016   RLE  . Fatty liver   . GERD   . H. pylori infection   . Hemorrhoids [id number]   Colonoscopy   . Hiatal hernia [id number]   EGD  . History of kidney stones   . HYPERLIPIDEMIA   . IBS (irritable bowel syndrome)   . Internal hemorrhoids   . Peripheral neuropathy   . Personal history of colonic polyps 10/27/2007   HYPERPLASTIC POLYP  . Pneumonia 12/2014  . TMJ (temporomandibular joint syndrome)   . Unspecified gastritis and gastroduodenitis without mention of hemorrhage 2009   EGD    Past Surgical History:  Procedure Laterality Date  . BELPHAROPTOSIS REPAIR Bilateral 01/2015   "droopy eyelids"  . CYSTOSCOPY/RETROGRADE/URETEROSCOPY/STONE EXTRACTION WITH BASKET  1987; 1992; 1999; 2010  . EXCISIONAL HEMORRHOIDECTOMY    . FOOT NEUROMA SURGERY Left 09/2015  . FOOT NEUROMA SURGERY Right 07/2015  . FOOT NEUROMA SURGERY Right 2004   "between big and 2nd toes;  shortened my 2nd toe"  . FRACTURE SURGERY    . HAMMER TOE SURGERY Left    "little toe"  . INCISION AND DRAINAGE FOOT Left 1992   "ball of foot; stepped on piece of hard wire & it went thru my shoe"  . KNEE ARTHROSCOPY Bilateral   . LITHOTRIPSY  1992  . NASAL FRACTURE SURGERY  1997  . PARTIAL KNEE ARTHROPLASTY Right 06/2003  . PLANTAR FASCIA RELEASE Bilateral 1987-1993   right-left  . RHINOPLASTY  2000  . SHOULDER ARTHROSCOPY Left 03/2011; 2013  . SHOULDER ARTHROSCOPY Right 2007   "frozen shoulder"  . TOENAIL EXCISION Bilateral 2012   "big toes"    There were no vitals filed for this visit.      Subjective Assessment - 09/04/16 1407    Subjective [name] states he hasn't talked to MD regarding medication. He reports he feels a little stronger, with less stiffness than when initiating therapy.  He reports he is having posterior thigh pain today.  He went walking at mall yesterday; unsure if that's why his back is stff/leg sore..    Pertinent History Pheripherial neuropathy 2009; lumbar DDD for several years; partial Rt knee replacement 2005; Lt knee surgery 1997; 3 shoulder surgeries; 6 foot surgeries; DVT Rt LE 2017   Currently in Pain? Yes   Pain Score 4   up to 5/10 in thigh   Pain Location Back  Pain Orientation Right;Lower   Pain Descriptors / Indicators Dull;Sore   Pain Radiating Towards down back of Rt thigh    Aggravating Factors  prolonged positions   Pain Relieving Factors ??            [facility] PT Assessment - 09/04/16 0001      Assessment   Medical Diagnosis Lumbar DDD    Referring Provider Dr. Dianah Field   Onset Date/Surgical Date 04/28/16   Hand Dominance Right     ROM / Strength   AROM / PROM / Strength Strength     Strength   Strength Assessment Site Hip   Right/Left Hip Right;Left   Right Hip Flexion --  5-/5   Left Hip Flexion 5/5          [facility] Adult PT Treatment/Exercise - 09/04/16 0001      Lumbar Exercises: Stretches   Active Hamstring  Stretch --  10 leg swings each direction   Passive Hamstring Stretch 2 reps;30 seconds   Passive Hamstring Stretch Limitations 1 rep straight knee, 1 rep bent knee;  Adductor stretch in supine x 2 reps each leg.    Single Knee to Chest Stretch 2 reps;20 seconds   Lower Trunk Rotation 2 reps;60 seconds   Piriformis Stretch 2 reps;30 seconds  travell      Lumbar Exercises: Aerobic   Stationary Bike NuStep L5: (arms - 9/legs) 6 min      Lumbar Exercises: Standing   Row Strengthening;Both;10 reps  core engaged   Theraband Level (Row) Level 3 (Green)   Shoulder Extension Strengthening;Both;10 reps;Theraband   Theraband Level (Shoulder Extension) Level 3 (Green)   Other Standing Lumbar Exercises anti-rotation with green band x 10 each side.      Lumbar Exercises: Supine   Bridge 15 reps  core engaged, ball squeeze between legs; 3 count hold.      Moist Heat Therapy   Number Minutes Moist Heat 15 Minutes   Moist Heat Location Lumbar Spine;Hip  bilat      Electrical Stimulation   Electrical Stimulation Location bilat SI jt; Rt medial hamstring    Electrical Stimulation Action premod to each area   Electrical Stimulation Parameters to tolerance    Electrical Stimulation Goals Pain                     PT Long Term Goals - 09/04/16 1418      PT LONG TERM GOAL #1   Title Improve mobility and ROM bilat LE's 09/17/16   Time 6   Period Weeks   Status On-going     PT LONG TERM GOAL #2   Title 5/5 bilat LE's 09/17/16   Time 6   Period Weeks   Status On-going     PT LONG TERM GOAL #3   Title Patient reports and demonstrates increased activity tolerance with decreased pain 09/17/16   Time 6   Period Weeks   Status On-going     PT LONG TERM GOAL #4   Title Independent HEP 09/17/16   Time 6   Period Weeks   Status On-going     PT LONG TERM GOAL #5   Title Improve FOTO to </= 42% limitation 09/17/16   Time 6   Period Weeks   Status On-going                Plan - 09/04/16 1456    Clinical Impression Statement Pt had reported some increased Rt post thigh today; reduced  with stretches and further reduced with estim.  He is reporting decreased pain and increased strength in LE with functional activities.  Pt making gradual gains towards goals.      Rehab Potential Good   PT Frequency 2x / week   PT Duration 6 weeks   PT Treatment/Interventions Patient/family education;ADLs/Self Care Home Management;Cryotherapy;Electrical Stimulation;Iontophoresis 4mg /ml Dexamethasone;Moist Heat;Ultrasound;Traction;Dry needling;Manual techniques;Therapeutic activities;Therapeutic exercise;Neuromuscular re-education   PT Next Visit Plan FOTO, gcode. Cont progressive LE/lumbar stretches/ strengthening.    Consulted and Agree with Plan of Care Patient      Patient will benefit from skilled therapeutic intervention in order to improve the following deficits and impairments:  Postural dysfunction, Improper body mechanics, Pain, Decreased range of motion, Decreased strength, Decreased mobility, Decreased activity tolerance  Visit Diagnosis: Chronic bilateral low back pain, with sciatica presence unspecified  Other symptoms and signs involving the musculoskeletal system  Weakness generalized     Problem List Patient Active Problem List   Diagnosis Date Noted  . Lumbar degenerative disc disease 07/31/2016  . Chronic anticoagulation 06/04/2016  . Gross hematuria 03/17/2016  . Right calf pain   . DVT, lower extremity, proximal, acute, right ([place]) 02/13/2016  . DVT, lower extremity, distal, acute, right ([place]) 02/13/2016  . Bilateral pulmonary embolism ([place]) 02/11/2016  . Pulmonary embolism ([place]) 02/11/2016  . Shortness of breath 02/08/2016  . Hearing loss due to cerumen impaction, right 01/19/2015  . Acute bronchitis 01/15/2015  . Nephrolithiasis 04/19/2014  . Peripheral neuropathy ([place]) 12/15/2013  . Thrombosed external hemorrhoid s/p I&D [id number] 06/09/2012   . 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 IBS (irritable bowel syndrome) 03/09/2012  . Abdominal pain 05/19/2011  . 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 ABDOMINAL PAIN 11/23/2009  . GERD 10/26/2007  . DIVERTICULOSIS, COLON 10/25/2007  . RENAL CALCULUS 10/25/2007  . HEMORRHOIDS 11/24/2006  . Hyperlipidemia 11/23/2006  . Anxiety state 11/23/2006  . DEPRESSION 11/23/2006  . COLONIC POLYPS, HX OF 11/23/2006   Kerin Perna, PTA 09/04/16 3:21 PM  [facility] [place], [place], [address] Phone: [phone]   Fax:  [phone]  Name: CLINTON DRAGONE MRN: [id number] Date of Birth: 03/01/1951

## 2016-09-09 ENCOUNTER — Ambulatory Visit (INDEPENDENT_AMBULATORY_CARE_PROVIDER_SITE_OTHER): Payer: Medicare Other | Admitting: Physical Therapy

## 2016-09-09 DIAGNOSIS — R29898 Other symptoms and signs involving the musculoskeletal system: Secondary | ICD-10-CM | POA: Diagnosis not present

## 2016-09-09 DIAGNOSIS — R531 Weakness: Secondary | ICD-10-CM | POA: Diagnosis not present

## 2016-09-09 DIAGNOSIS — G8929 Other chronic pain: Secondary | ICD-10-CM

## 2016-09-09 DIAGNOSIS — M545 Low back pain: Secondary | ICD-10-CM | POA: Diagnosis not present

## 2016-09-09 NOTE — Therapy (Signed)
[facility], [place], [address] Phone: [phone]   Fax:  [phone]  Physical Therapy Treatment  Patient Details  Name: David Armstrong MRN: [id number] Date of Birth: [birth year] Referring Provider: Dr. Dianah Field  Encounter Date: 09/09/2016      PT End of Session - 09/09/16 1102    Visit Number 10   Number of Visits 12   Date for PT Re-Evaluation 09/17/16   PT Start Time 1102   PT Stop Time 1157   PT Time Calculation (min) 55 min   Activity Tolerance Patient tolerated treatment well   Behavior During Therapy [facility] for tasks assessed/performed      Past Medical History:  Diagnosis Date  . Anal fissure   . ANXIETY   . Arthritis    "lower back; hips;' right knee" (02/11/2016)  . Bilateral pulmonary embolism ([place]) 02/11/2016  . Bipolar disorder ([place])   . Chronic constipation   . Chronic lower back pain   . DEPRESSION   . DIVERTICULOSIS, COLON [phone]   Colonoscopy  . DVT (deep venous thrombosis) ([place]) 02/11/2016   RLE  . Fatty liver   . GERD   . H. pylori infection   . Hemorrhoids [id number]   Colonoscopy   . Hiatal hernia [id number]   EGD  . History of kidney stones   . HYPERLIPIDEMIA   . IBS (irritable bowel syndrome)   . Internal hemorrhoids   . Peripheral neuropathy   . Personal history of colonic polyps 10/27/2007   HYPERPLASTIC POLYP  . Pneumonia 12/2014  . TMJ (temporomandibular joint syndrome)   . Unspecified gastritis and gastroduodenitis without mention of hemorrhage 2009   EGD    Past Surgical History:  Procedure Laterality Date  . BELPHAROPTOSIS REPAIR Bilateral 01/2015   "droopy eyelids"  . CYSTOSCOPY/RETROGRADE/URETEROSCOPY/STONE EXTRACTION WITH BASKET  1987; 1992; 1999; 2010  . EXCISIONAL HEMORRHOIDECTOMY    . FOOT NEUROMA SURGERY Left 09/2015  . FOOT NEUROMA SURGERY Right 07/2015  . FOOT NEUROMA SURGERY Right 2004   "between big and 2nd toes;  shortened my 2nd toe"  . FRACTURE SURGERY    . HAMMER TOE SURGERY Left    "little toe"  . INCISION AND DRAINAGE FOOT Left 1992   "ball of foot; stepped on piece of hard wire & it went thru my shoe"  . KNEE ARTHROSCOPY Bilateral   . LITHOTRIPSY  1992  . NASAL FRACTURE SURGERY  1997  . PARTIAL KNEE ARTHROPLASTY Right 06/2003  . PLANTAR FASCIA RELEASE Bilateral 1987-1993   right-left  . RHINOPLASTY  2000  . SHOULDER ARTHROSCOPY Left 03/2011; 2013  . SHOULDER ARTHROSCOPY Right 2007   "frozen shoulder"  . TOENAIL EXCISION Bilateral 2012   "big toes"    There were no vitals filed for this visit.      Subjective Assessment - 09/09/16 1102    Subjective Pt reports he has been walking more; his Lt knee has been more bothersome.  His low back and Rt hamstring are less stiff.  Rt hamstring mostly hurts when he sits too long on that side.    Currently in Pain? Yes   Pain Score 3    Pain Location Back   Pain Orientation Right;Lower   Pain Descriptors / Indicators Tightness;Dull   Pain Radiating Towards down back of Rt thigh   Aggravating Factors  prolonged positions   Pain Relieving Factors ??            [facility] PT Assessment -  09/09/16 0001      Assessment   Medical Diagnosis Lumbar DDD    Referring Provider Dr. Dianah Field   Onset Date/Surgical Date 04/28/16   Hand Dominance Right     Observation/Other Assessments   Focus on Therapeutic Outcomes (FOTO)  31% limited          [facility] Adult PT Treatment/Exercise - 09/09/16 0001      Lumbar Exercises: Stretches   Passive Hamstring Stretch 2 reps;30 seconds   Passive Hamstring Stretch Limitations 1 rep straight knee, 1 rep bent knee;  Adductor stretch in supine x 2 reps each leg.    Single Knee to Chest Stretch 2 reps;20 seconds   Double Knee to Chest Stretch 1 rep;10 seconds   Lower Trunk Rotation 2 reps;60 seconds   Piriformis Stretch 2 reps;30 seconds  travell      Lumbar Exercises: Aerobic   Stationary Bike NuStep  L5: (arms - 9/legs) 6 min      Lumbar Exercises: Standing   Scapular Retraction Both;Strengthening;10 reps  isometric ER against TB attempting to engage core    Theraband Level (Scapular Retraction) Level 3 (Green)   Row Strengthening;Both;10 reps  core engaged   Theraband Level (Row) Level 3 (Green)   Shoulder Extension Strengthening;Both;10 reps;Theraband   Theraband Level (Shoulder Extension) Level 3 (Green)   Other Standing Lumbar Exercises anti-rotation with green band x 10 each side.  Wood chop with green band x 10 reps each side- difficulty with form despite cues.    Other Standing Lumbar Exercises Lat stretch with arms on waist level      Lumbar Exercises: Supine   Bridge 15 reps;5 seconds     Lumbar Exercises: Sidelying   Clam 10 reps  ball between feet     Moist Heat Therapy   Number Minutes Moist Heat 15 Minutes   Moist Heat Location Lumbar Spine     Electrical Stimulation   Electrical Stimulation Location bilat lumbar paraspinals   Electrical Stimulation Action IFC   Electrical Stimulation Parameters to tolerance    Electrical Stimulation Goals Pain                     PT Long Term Goals - 09/09/16 1134      PT LONG TERM GOAL #1   Title Improve mobility and ROM bilat LE's 09/17/16   Time 6   Period Weeks   Status On-going     PT LONG TERM GOAL #2   Title 5/5 bilat LE's 09/17/16   Time 6   Period Weeks   Status On-going     PT LONG TERM GOAL #3   Title Patient reports and demonstrates increased activity tolerance with decreased pain 09/17/16   Time 6   Period Weeks   Status On-going  pt reports 50% improvement in pain since starting therapy     PT LONG TERM GOAL #4   Title Independent HEP 09/17/16   Time 6   Period Weeks     PT LONG TERM GOAL #5   Title Improve FOTO to </= 42% limitation 09/17/16   Time 6   Period Weeks   Status Achieved               Plan - 09/09/16 1144    Clinical Impression Statement Pt had improved FOT  score of 31% limited; has met LTG#5.  He tolerated all exercises, including new ones, without any increase in symptoms.  His exercise tolerance has improved.  David Armstrong is  reporting 50% improvement in pain since initiating therapy.  He is making gains towards established goals.    Rehab Potential Good   PT Frequency 2x / week   PT Duration 6 weeks   PT Treatment/Interventions Patient/family education;ADLs/Self Care Home Management;Cryotherapy;Electrical Stimulation;Iontophoresis 25m/ml Dexamethasone;Moist Heat;Ultrasound;Traction;Dry needling;Manual techniques;Therapeutic activities;Therapeutic exercise;Neuromuscular re-education   PT Next Visit Plan Cont progressive LE/lumbar stretches/ strengthening.    Consulted and Agree with Plan of Care Patient      Patient will benefit from skilled therapeutic intervention in order to improve the following deficits and impairments:  Postural dysfunction, Improper body mechanics, Pain, Decreased range of motion, Decreased strength, Decreased mobility, Decreased activity tolerance  Visit Diagnosis: Chronic bilateral low back pain, with sciatica presence unspecified  Other symptoms and signs involving the musculoskeletal system  Weakness generalized     Problem List Patient Active Problem List   Diagnosis Date Noted  . Lumbar degenerative disc disease 07/31/2016  . Chronic anticoagulation 06/04/2016  . Gross hematuria 03/17/2016  . Right calf pain   . DVT, lower extremity, proximal, acute, right ([place] 02/13/2016  . DVT, lower extremity, distal, acute, right ([place] 02/13/2016  . Bilateral pulmonary embolism ([place] 02/11/2016  . Pulmonary embolism ([place] 02/11/2016  . Shortness of breath 02/08/2016  . Hearing loss due to cerumen impaction, right 01/19/2015  . Acute bronchitis 01/15/2015  . Nephrolithiasis 04/19/2014  . Peripheral neuropathy ([place] 12/15/2013  . Thrombosed external hemorrhoid s/p I&D [id number]/26/2014  . Chronic idiopathic anal pain  03/09/2012  . IBS (irritable bowel syndrome) 03/09/2012  . Abdominal pain 05/19/2011  . 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 ABDOMINAL PAIN 11/23/2009  . GERD 10/26/2007  . DIVERTICULOSIS, COLON 10/25/2007  . RENAL CALCULUS 10/25/2007  . HEMORRHOIDS 11/24/2006  . Hyperlipidemia 11/23/2006  . Anxiety state 11/23/2006  . DEPRESSION 11/23/2006  . COLONIC POLYPS, HX OF 11/23/2006   JKerin Perna PTA 09/09/16 11:48 AM  Celyn P. HHelene KelpPT, MPH 09/09/16 12:54 PM   [facility] [address]Phone: [phone]  Fax:  [phone] Name: JGJON LETARTEMRN: [id number] of Birth: [birth year]

## 2016-09-12 ENCOUNTER — Encounter: Payer: Self-pay | Admitting: Rehabilitative and Restorative Service Providers"

## 2016-09-12 ENCOUNTER — Ambulatory Visit (INDEPENDENT_AMBULATORY_CARE_PROVIDER_SITE_OTHER): Payer: Medicare Other | Admitting: Physician Assistant

## 2016-09-12 ENCOUNTER — Ambulatory Visit (INDEPENDENT_AMBULATORY_CARE_PROVIDER_SITE_OTHER): Payer: Medicare Other | Admitting: Rehabilitative and Restorative Service Providers"

## 2016-09-12 VITALS — BP 113/74 | HR 75 | Temp 97.6°F | Wt 178.0 lb

## 2016-09-12 DIAGNOSIS — R531 Weakness: Secondary | ICD-10-CM

## 2016-09-12 DIAGNOSIS — R29898 Other symptoms and signs involving the musculoskeletal system: Secondary | ICD-10-CM

## 2016-09-12 DIAGNOSIS — G8929 Other chronic pain: Secondary | ICD-10-CM | POA: Diagnosis not present

## 2016-09-12 DIAGNOSIS — M545 Low back pain: Secondary | ICD-10-CM | POA: Diagnosis not present

## 2016-09-12 DIAGNOSIS — L309 Dermatitis, unspecified: Secondary | ICD-10-CM

## 2016-09-12 MED ORDER — TRIAMCINOLONE ACETONIDE 0.5 % EX OINT
1.0000 | TOPICAL_OINTMENT | Freq: Two times a day (BID) | CUTANEOUS | 0 refills | Status: DC
Start: 2016-09-12 — End: 2016-11-11

## 2016-09-12 MED ORDER — CEPHALEXIN 500 MG PO CAPS: 500.0000 mg | ORAL_CAPSULE | Freq: Two times a day (BID) | ORAL | 0 refills | Status: DC

## 2016-09-12 NOTE — Therapy (Signed)
[facility] [name], [place], [address] Phone: [phone]   Fax:  [phone]  Physical Therapy Treatment  Patient Details  Name: David Armstrong MRN: [id number] Date of Birth: 29-Jul-1950 Referring Provider: Dr. Dianah Field  Encounter Date: 09/12/2016      PT End of Session - 09/12/16 1454    Visit Number 11   Number of Visits 12   Date for PT Re-Evaluation 09/17/16   PT Start Time 1448   PT Stop Time 1540   PT Time Calculation (min) 52 min   Activity Tolerance Patient tolerated treatment well      Past Medical History:  Diagnosis Date  . Anal fissure   . ANXIETY   . Arthritis    "lower back; hips;' right knee" (02/11/2016)  . Bilateral pulmonary embolism ([place]) 02/11/2016  . Bipolar disorder ([place])   . Chronic constipation   . Chronic lower back pain   . DEPRESSION   . DIVERTICULOSIS, COLON [phone]   Colonoscopy  . DVT (deep venous thrombosis) ([place]) 02/11/2016   RLE  . Fatty liver   . GERD   . H. pylori infection   . Hemorrhoids [id number]   Colonoscopy   . Hiatal hernia [id number]   EGD  . History of kidney stones   . HYPERLIPIDEMIA   . IBS (irritable bowel syndrome)   . Internal hemorrhoids   . Peripheral neuropathy   . Personal history of colonic polyps 10/27/2007   HYPERPLASTIC POLYP  . Pneumonia 12/2014  . TMJ (temporomandibular joint syndrome)   . Unspecified gastritis and gastroduodenitis without mention of hemorrhage 2009   EGD    Past Surgical History:  Procedure Laterality Date  . BELPHAROPTOSIS REPAIR Bilateral 01/2015   "droopy eyelids"  . CYSTOSCOPY/RETROGRADE/URETEROSCOPY/STONE EXTRACTION WITH BASKET  1987; 1992; 1999; 2010  . EXCISIONAL HEMORRHOIDECTOMY    . FOOT NEUROMA SURGERY Left 09/2015  . FOOT NEUROMA SURGERY Right 07/2015  . FOOT NEUROMA SURGERY Right 2004   "between big and 2nd toes; shortened my 2nd toe"  . FRACTURE SURGERY    . HAMMER TOE SURGERY  Left    "little toe"  . INCISION AND DRAINAGE FOOT Left 1992   "ball of foot; stepped on piece of hard wire & it went thru my shoe"  . KNEE ARTHROSCOPY Bilateral   . LITHOTRIPSY  1992  . NASAL FRACTURE SURGERY  1997  . PARTIAL KNEE ARTHROPLASTY Right 06/2003  . PLANTAR FASCIA RELEASE Bilateral 1987-1993   right-left  . RHINOPLASTY  2000  . SHOULDER ARTHROSCOPY Left 03/2011; 2013  . SHOULDER ARTHROSCOPY Right 2007   "frozen shoulder"  . TOENAIL EXCISION Bilateral 2012   "big toes"    There were no vitals filed for this visit.      Subjective Assessment - 09/12/16 1455    Subjective Patient reports that he has had more pain in the LB since last visit. May have been some of the exercises he did - especially the rotation through his back. He is having some pain in the Lt knee today as well. He has had two surgeries on the Lt knee in the past.    Currently in Pain? Yes   Pain Score 5    Pain Location Back   Pain Orientation Right;Lower;Left   Pain Descriptors / Indicators Tightness;Dull   Pain Type Chronic pain   Pain Onset More than a month ago  [place] Adult PT Treatment/Exercise - 09/12/16 0001      Lumbar Exercises: Stretches   Passive Hamstring Stretch 2 reps;30 seconds   Piriformis Stretch 2 reps;30 seconds  travell      Lumbar Exercises: Aerobic   Stationary Bike NuStep L5: (arms - 9/legs) 6 min      Lumbar Exercises: Standing   Scapular Retraction Both;Strengthening;10 reps  isometric ER against TB attempting to engage core    Theraband Level (Scapular Retraction) Level 3 (Green)   Row Strengthening;Both;10 reps  core engaged   Theraband Level (Row) Level 3 (Green)   Shoulder Extension Strengthening;Both;10 reps;Theraband   Theraband Level (Shoulder Extension) Level 3 (Green)   Other Standing Lumbar Exercises hold rotation      Lumbar Exercises: Supine   Ab Set --  three part core 10 sec x 10    Bridge 20 reps;5 seconds      Lumbar Exercises: Sidelying   Clam 10 reps     Moist Heat Therapy   Number Minutes Moist Heat 15 Minutes   Moist Heat Location Lumbar Spine     Cryotherapy   Number Minutes Cryotherapy 15 Minutes   Cryotherapy Location Knee  bilat anterior    Type of Cryotherapy Ice pack     Electrical Stimulation   Electrical Stimulation Location bilat lumbar paraspinals   Electrical Stimulation Action IFC   Electrical Stimulation Parameters to tolerance   Electrical Stimulation Goals Pain                     PT Long Term Goals - 09/09/16 1134      PT LONG TERM GOAL #1   Title Improve mobility and ROM bilat LE's 09/17/16   Time 6   Period Weeks   Status On-going     PT LONG TERM GOAL #2   Title 5/5 bilat LE's 09/17/16   Time 6   Period Weeks   Status On-going     PT LONG TERM GOAL #3   Title Patient reports and demonstrates increased activity tolerance with decreased pain 09/17/16   Time 6   Period Weeks   Status On-going  pt reports 50% improvement in pain since starting therapy     PT LONG TERM GOAL #4   Title Independent HEP 09/17/16   Time 6   Period Weeks     PT LONG TERM GOAL #5   Title Improve FOTO to </= 42% limitation 09/17/16   Time 6   Period Weeks   Status Achieved               Plan - 09/12/16 1507    Clinical Impression Statement [name] has increased pain in the LB as well as some painin the Lt knee today. May have had difficulty with rotational activities from last visit. Held all rotational activities today - focus on single plane activities including stretching and stabilization. Will assess response to treatment and determine need for further treatment at next visit. Was making progress toward goals but has experienced flare up of LBP as well as onset of recurrent Lt knee pain.    Rehab Potential Good   PT Frequency 2x / week   PT Duration 6 weeks   PT Treatment/Interventions Patient/family education;ADLs/Self Care Home  Management;Cryotherapy;Electrical Stimulation;Iontophoresis 4mg /ml Dexamethasone;Moist Heat;Ultrasound;Traction;Dry needling;Manual techniques;Therapeutic activities;Therapeutic exercise;Neuromuscular re-education   PT Next Visit Plan Cont progressive LE/lumbar stretches/ strengthening.    Consulted and Agree with Plan of Care Patient      Patient will  benefit from skilled therapeutic intervention in order to improve the following deficits and impairments:  Postural dysfunction, Improper body mechanics, Pain, Decreased range of motion, Decreased strength, Decreased mobility, Decreased activity tolerance  Visit Diagnosis: Chronic bilateral low back pain, with sciatica presence unspecified  Other symptoms and signs involving the musculoskeletal system  Weakness generalized     Problem List Patient Active Problem List   Diagnosis Date Noted  . Lumbar degenerative disc disease 07/31/2016  . Chronic anticoagulation 06/04/2016  . Gross hematuria 03/17/2016  . Right calf pain   . DVT, lower extremity, proximal, acute, right ([place]) 02/13/2016  . DVT, lower extremity, distal, acute, right ([place]) 02/13/2016  . Bilateral pulmonary embolism ([place]) 02/11/2016  . Pulmonary embolism ([place]) 02/11/2016  . Shortness of breath 02/08/2016  . Hearing loss due to cerumen impaction, right 01/19/2015  . Acute bronchitis 01/15/2015  . Nephrolithiasis 04/19/2014  . Peripheral neuropathy ([place]) 12/15/2013  . Thrombosed external hemorrhoid s/p I&D [id number] 06/09/2012  . 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 IBS (irritable bowel syndrome) 03/09/2012  . Abdominal pain 05/19/2011  . 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 ABDOMINAL PAIN 11/23/2009  . GERD 10/26/2007  . DIVERTICULOSIS, COLON 10/25/2007  . RENAL CALCULUS 10/25/2007  . HEMORRHOIDS 11/24/2006  . Hyperlipidemia 11/23/2006  . Anxiety state 11/23/2006  . DEPRESSION 11/23/2006  . COLONIC POLYPS, HX OF 11/23/2006    Lakeesha Fontanilla Nilda Simmer PT, MPH   09/12/2016, 3:30 PM  [facility] [place], [place], [address] Phone: [phone]   Fax:  [phone]  Name: David Armstrong MRN: [id number] Date of Birth: Jun 11, 1950

## 2016-09-12 NOTE — Patient Instructions (Addendum)
-   Apply steroid cream to affected area twice a day - Avoid skin picking. Keep area clean and dry - If you develop signs of infection - redness spreading outside the marker lines, pain, purulent drainage, fevers - fill the antibiotic - Follow-up if no improvement in 1 week   Rash A rash is a change in the color of the skin. A rash can also change the way your skin feels. There are many different conditions and factors that can cause a rash. Follow these instructions at home: Pay attention to any changes in your symptoms. Follow these instructions to help with your condition: Medicine Take or apply over-the-counter and prescription medicines only as told by your health care provider. These may include:  Corticosteroid cream.  Anti-itch lotions.  Oral antihistamines.  Skin Care  Apply cool compresses to the affected areas.  Try taking a bath with: ? Epsom salts. Follow the instructions on the packaging. You can get these at your local pharmacy or grocery store. ? Baking soda. Pour a small amount into the bath as told by your health care provider. ? Colloidal oatmeal. Follow the instructions on the packaging. You can get this at your local pharmacy or grocery store.  Try applying baking soda paste to your skin. Stir water into baking soda until it reaches a paste-like consistency.  Do not scratch or rub your skin.  Avoid covering the rash. Make sure the rash is exposed to air as much as possible. General instructions  Avoid hot showers or baths, which can make itching worse. A cold shower may help.  Avoid scented soaps, detergents, and perfumes. Use gentle soaps, detergents, perfumes, and other cosmetic products.  Avoid any substance that causes your rash. Keep a journal to help track what causes your rash. Write down: ? What you eat. ? What cosmetic products you use. ? What you drink. ? What you wear. This includes jewelry.  Keep all follow-up visits as told by your health  care provider. This is important. Contact a health care provider if:  You sweat at night.  You lose weight.  You urinate more than normal.  You feel weak.  You vomit.  Your skin or the whites of your eyes look yellow (jaundice).  Your skin: ? Tingles. ? Is numb.  Your rash: ? Does not go away after several days. ? Gets worse.  You are: ? Unusually thirsty. ? More tired than normal.  You have: ? New symptoms. ? Pain in your abdomen. ? A fever. ? Diarrhea. Get help right away if:  You develop a rash that covers all or most of your body. The rash may or may not be painful.  You develop blisters that: ? Are on top of the rash. ? Grow larger or grow together. ? Are painful. ? Are inside your nose or mouth.  You develop a rash that: ? Looks like purple pinprick-sized spots all over your body. ? Has a "bull's eye" or looks like a target. ? Is not related to sun exposure, is red and painful, and causes your skin to peel. This information is not intended to replace advice given to you by your health care provider. Make sure you discuss any questions you have with your health care provider. Document Released: 03/21/2002 Document Revised: 09/04/2015 Document Reviewed: 08/16/2014 Elsevier Interactive Patient Education  2017 [employer].

## 2016-09-12 NOTE — Progress Notes (Signed)
HPI:                                                                David Armstrong is a 66 y.o. male who presents to [facility]: [facility] today for skin lesion  Onset: 3 weeks ago Location: started on right lower extremity Duration: constant Character: pruritic, nontender, non-bleeding, non-weeping Aggravating factors / Triggers: patient removed a scab after a dermatologic procedure Treatments tried: none  Recent illness / systemic symptoms: none  Medication / drug exposure: none Recent travel: none Animal/insect exposure: none History of allergies: history of dermatitis Exposure to new soaps, perfumes, cleaning products: none Exposure to chemicals: none      Past Medical History:  Diagnosis Date  . Anal fissure   . ANXIETY   . Arthritis    "lower back; hips;' right knee" (02/11/2016)  . Bilateral pulmonary embolism ([place]) 02/11/2016  . Bipolar disorder ([place])   . Chronic constipation   . Chronic lower back pain   . DEPRESSION   . DIVERTICULOSIS, COLON [phone]   Colonoscopy  . DVT (deep venous thrombosis) ([place]) 02/11/2016   RLE  . Fatty liver   . GERD   . H. pylori infection   . Hemorrhoids [id number]   Colonoscopy   . Hiatal hernia [id number]   EGD  . History of kidney stones   . HYPERLIPIDEMIA   . IBS (irritable bowel syndrome)   . Internal hemorrhoids   . Peripheral neuropathy   . Personal history of colonic polyps 10/27/2007   HYPERPLASTIC POLYP  . Pneumonia 12/2014  . TMJ (temporomandibular joint syndrome)   . Unspecified gastritis and gastroduodenitis without mention of hemorrhage 2009   EGD   Past Surgical History:  Procedure Laterality Date  . BELPHAROPTOSIS REPAIR Bilateral 01/2015   "droopy eyelids"  . CYSTOSCOPY/RETROGRADE/URETEROSCOPY/STONE EXTRACTION WITH BASKET  1987; 1992; 1999; 2010  . EXCISIONAL HEMORRHOIDECTOMY    . FOOT NEUROMA SURGERY Left 09/2015  . FOOT NEUROMA SURGERY Right 07/2015   . FOOT NEUROMA SURGERY Right 2004   "between big and 2nd toes; shortened my 2nd toe"  . FRACTURE SURGERY    . HAMMER TOE SURGERY Left    "little toe"  . INCISION AND DRAINAGE FOOT Left 1992   "ball of foot; stepped on piece of hard wire & it went thru my shoe"  . KNEE ARTHROSCOPY Bilateral   . LITHOTRIPSY  1992  . NASAL FRACTURE SURGERY  1997  . PARTIAL KNEE ARTHROPLASTY Right 06/2003  . PLANTAR FASCIA RELEASE Bilateral 1987-1993   right-left  . RHINOPLASTY  2000  . SHOULDER ARTHROSCOPY Left 03/2011; 2013  . SHOULDER ARTHROSCOPY Right 2007   "frozen shoulder"  . TOENAIL EXCISION Bilateral 2012   "big toes"   Social History  Substance Use Topics  . Smoking status: Never Smoker  . Smokeless tobacco: Never Used  . Alcohol use No   family history includes Cirrhosis in his father; Heart disease in his brother, father, and mother; Parkinson's disease in his paternal grandfather; Prostate cancer in his maternal grandfather; Skin cancer in his father; Stroke in his mother.  ROS: negative except as noted in the HPI  Medications: Current Outpatient Prescriptions  Medication Sig Dispense Refill  . AMBULATORY NON FORMULARY MEDICATION Medication  Name: Nitroglycerin Gel .0125% use rectally three times a day 30 g 0  . desonide (DESOWEN) 0.05 % lotion Apply 1 application topically daily as needed (for skin).    Marland Kitchen dicyclomine (BENTYL) 10 MG capsule Take 1 capsule (10 mg total) by mouth 4 (four) times daily -  before meals and at bedtime. 90 capsule 1  . fluticasone (FLONASE) 50 MCG/ACT nasal spray Place 2 sprays into both nostrils daily as needed for allergies or rhinitis.    . hydrocortisone (ANUSOL-HC) 2.5 % rectal cream Place 1 application rectally at bedtime. At bedtime for 7 days 30 g 1  . hyoscyamine (ANASPAZ) 0.125 MG TBDP disintergrating tablet Place 1 tablet (0.125 mg total) under the tongue every 6 (six) hours as needed. 30 tablet 3  . ipratropium (ATROVENT) 0.06 % nasal spray Place  2 sprays into both nostrils every 4 (four) hours as needed for rhinitis. 10 mL 6  . LORazepam (ATIVAN) 1 MG tablet Take 1 mg by mouth every 8 (eight) hours as needed for anxiety.     Marland Kitchen lubiprostone (AMITIZA) 24 MCG capsule Take 1 capsule (24 mcg total) by mouth 2 (two) times daily with a meal. 60 capsule 3  . omeprazole (PRILOSEC) 20 MG capsule Take 1 capsule (20 mg total) by mouth daily. 30 capsule 3  . predniSONE (DELTASONE) 50 MG tablet One tab PO daily for 5 days. 5 tablet 0  . tiZANidine (ZANAFLEX) 4 MG tablet Take 1 tablet (4 mg total) by mouth every 6 (six) hours as needed for muscle spasms. 30 tablet 0  . traMADol (ULTRAM) 50 MG tablet Take 1 tablet (50 mg total) by mouth 2 (two) times daily as needed for severe pain. 15 tablet 0  . XARELTO 20 MG TABS tablet Take 1 tablet (20 mg total) by mouth daily. 30 tablet 0   No current facility-administered medications for this visit.    Allergies  Allergen Reactions  . Dexilant [Dexlansoprazole] Other (See Comments)    Muscle aches  . Ivp Dye [Iodinated Diagnostic Agents] Rash  . Soma [Carisoprodol] Other (See Comments)    Sores on arm  . Sulfamethoxazole Other (See Comments)    REACTION: unspecified       Objective:  BP 113/74   Pulse 75   Temp 97.6 F (36.4 C) (Oral)   Wt 178 lb (80.7 kg)   BMI 25.36 kg/m  Gen: well-groomed, cooperative, not ill-appearing, no distress Pulm: Normal work of breathing, normal phonation  Neuro: alert and oriented x 3, EOM's intact MSK: moving all extremities, normal gait and station, no peripheral edema Skin: approx. 1 cm x 1.5 cm shallow ulceration on the posterior distal right lower extremity, no surrounding erythema or induration, no drainage   No results found for this or any previous visit (from the past 72 hour(s)). No results found.    Assessment and Plan: 66 y.o. male with   1. Dermatitis - no signs of cellulitis on exam today. Instructed to avoid skin-picking and apply topical  corticosteroid bid. Patient educated on signs of infection and provided with hardcopy prescription for Keflex to fill if he develops signs of cellulitis - triamcinolone ointment (KENALOG) 0.5 %; Apply 1 application topically 2 (two) times daily. To affected area, avoid eyes and face  Dispense: 30 g; Refill: 0 - cephALEXin (KEFLEX) 500 MG capsule; Take 1 capsule (500 mg total) by mouth 2 (two) times daily.  Dispense: 14 capsule; Refill: 0  Patient education and anticipatory guidance given Patient agrees with  treatment plan Follow-up as needed if symptoms worsen or fail to improve  Darlyne Russian PA-C

## 2016-09-14 ENCOUNTER — Encounter: Payer: Self-pay | Admitting: Physician Assistant

## 2016-09-15 ENCOUNTER — Ambulatory Visit (INDEPENDENT_AMBULATORY_CARE_PROVIDER_SITE_OTHER): Payer: Medicare Other | Admitting: Physical Therapy

## 2016-09-15 DIAGNOSIS — M545 Low back pain: Secondary | ICD-10-CM | POA: Diagnosis not present

## 2016-09-15 DIAGNOSIS — G8929 Other chronic pain: Secondary | ICD-10-CM | POA: Diagnosis not present

## 2016-09-15 DIAGNOSIS — R29898 Other symptoms and signs involving the musculoskeletal system: Secondary | ICD-10-CM | POA: Diagnosis not present

## 2016-09-15 DIAGNOSIS — R531 Weakness: Secondary | ICD-10-CM

## 2016-09-15 NOTE — Therapy (Addendum)
[facility] [place], [place], [address] Phone: [phone]   Fax:  [phone]  Physical Therapy Treatment  Patient Details  Name: David Armstrong MRN: [id number] Date of Birth: Feb 21, 1951 Referring Provider: Dr. Dianah Field  Encounter Date: 09/15/2016      PT End of Session - 09/15/16 1108    Visit Number 12   Number of Visits 12   Date for PT Re-Evaluation 09/17/16   PT Start Time 1101   PT Stop Time 1159   PT Time Calculation (min) 58 min   Activity Tolerance Patient tolerated treatment well   Behavior During Therapy [facility] for tasks assessed/performed      Past Medical History:  Diagnosis Date  . Anal fissure   . ANXIETY   . Arthritis    "lower back; hips;' right knee" (02/11/2016)  . Bilateral pulmonary embolism ([place]) 02/11/2016  . Bipolar disorder ([place])   . Chronic constipation   . Chronic lower back pain   . DEPRESSION   . DIVERTICULOSIS, COLON [phone]   Colonoscopy  . DVT (deep venous thrombosis) ([place]) 02/11/2016   RLE  . Fatty liver   . GERD   . H. pylori infection   . Hemorrhoids [id number]   Colonoscopy   . Hiatal hernia [id number]   EGD  . History of kidney stones   . HYPERLIPIDEMIA   . IBS (irritable bowel syndrome)   . Internal hemorrhoids   . Peripheral neuropathy   . Personal history of colonic polyps 10/27/2007   HYPERPLASTIC POLYP  . Pneumonia 12/2014  . TMJ (temporomandibular joint syndrome)   . Unspecified gastritis and gastroduodenitis without mention of hemorrhage 2009   EGD    Past Surgical History:  Procedure Laterality Date  . BELPHAROPTOSIS REPAIR Bilateral 01/2015   "droopy eyelids"  . CYSTOSCOPY/RETROGRADE/URETEROSCOPY/STONE EXTRACTION WITH BASKET  1987; 1992; 1999; 2010  . EXCISIONAL HEMORRHOIDECTOMY    . FOOT NEUROMA SURGERY Left 09/2015  . FOOT NEUROMA SURGERY Right 07/2015  . FOOT NEUROMA SURGERY Right 2004   "between big and 2nd toes;  shortened my 2nd toe"  . FRACTURE SURGERY    . HAMMER TOE SURGERY Left    "little toe"  . INCISION AND DRAINAGE FOOT Left 1992   "ball of foot; stepped on piece of hard wire & it went thru my shoe"  . KNEE ARTHROSCOPY Bilateral   . LITHOTRIPSY  1992  . NASAL FRACTURE SURGERY  1997  . PARTIAL KNEE ARTHROPLASTY Right 06/2003  . PLANTAR FASCIA RELEASE Bilateral 1987-1993   right-left  . RHINOPLASTY  2000  . SHOULDER ARTHROSCOPY Left 03/2011; 2013  . SHOULDER ARTHROSCOPY Right 2007   "frozen shoulder"  . TOENAIL EXCISION Bilateral 2012   "big toes"    There were no vitals filed for this visit.      Subjective Assessment - 09/15/16 1104    Subjective Pt reports his back is stiff, but not painful.  His knees have been more troublesome.  Pt reports he feels the same as last week.    Patient Stated Goals feel better; decrease pain    Currently in Pain? Yes   Pain Score 4    Pain Location Knee   Pain Orientation Right;Left   Pain Descriptors / Indicators Tender   Aggravating Factors  twisting, bending   Pain Relieving Factors ??            [facility] PT Assessment - 09/15/16 0001      Assessment   Medical  Diagnosis Lumbar DDD    Referring Provider Dr. Dianah Field   Onset Date/Surgical Date 04/28/16   Hand Dominance Right   Next MD Visit none scheduled      Strength   Right/Left Hip Right;Left   Right Hip Flexion 5/5   Right Hip Extension --  5-/5   Right Hip ABduction 5/5   Left Hip Flexion 5/5   Left Hip Extension --  5-/5   Left Hip ABduction 5/5          [facility] Adult PT Treatment/Exercise - 09/15/16 0001      Lumbar Exercises: Stretches   Passive Hamstring Stretch 2 reps;60 seconds  supine with strap   Quad Stretch 2 reps;30 seconds  and hip flexor; seated one leg back   Piriformis Stretch 2 reps;30 seconds  travell      Lumbar Exercises: Aerobic   Stationary Bike --  pt declined aerobic equip due to knee pain     Lumbar Exercises: Standing    Scapular Retraction Both;Strengthening;10 reps  isometric ER against TB attempting to engage core x 2 sets   Theraband Level (Scapular Retraction) Level 3 (Green)   Row Strengthening;Both;10 reps  core engaged, 2 sets   Theraband Level (Row) Level 3 (Green)   Shoulder Extension Strengthening;Both;10 reps;Theraband   Theraband Level (Shoulder Extension) Level 3 (Green)   Other Standing Lumbar Exercises held rotation exercises.    Other Standing Lumbar Exercises Lat stretch with arms on waist level x 15 sec, 2 x     Lumbar Exercises: Supine   Ab Set --  three part core 10 sec x 10    Bridge 20 reps;5 seconds     Lumbar Exercises: Sidelying   Clam 10 reps  2 sets     Moist Heat Therapy   Number Minutes Moist Heat 15 Minutes   Moist Heat Location Lumbar Spine     Cryotherapy   Number Minutes Cryotherapy 15 Minutes   Cryotherapy Location Knee  bilat anterior    Type of Cryotherapy Ice pack     Electrical Stimulation   Electrical Stimulation Location bilat lumbar paraspinals   Electrical Stimulation Action IFC   Electrical Stimulation Parameters to tolerance    Electrical Stimulation Goals Pain                PT Education - 09/15/16 1134    Education provided Yes   Education Details HEP    Person(s) Educated Patient   Methods Explanation;Demonstration;Handout;Tactile cues   Comprehension Verbalized understanding;Returned demonstration             PT Long Term Goals - 09/15/16 1144      PT LONG TERM GOAL #1   Title Improve mobility and ROM bilat LE's 09/17/16   Time 6   Period Weeks   Status On-going     PT LONG TERM GOAL #2   Title 5/5 bilat LE's 09/17/16   Time 6   Period Weeks   Status Partially Met     PT LONG TERM GOAL #3   Title Patient reports and demonstrates increased activity tolerance with decreased pain 09/17/16   Time 6   Period Weeks   Status On-going  pt reports 50% improvement in symptoms.      PT LONG TERM GOAL #4   Title  Independent HEP 09/17/16   Time 6   Period Weeks   Status On-going     PT LONG TERM GOAL #5   Title Improve FOTO to </= 42% limitation  09/17/16   Time 6   Period Weeks   Status Achieved               Plan - 09/15/16 1145    Clinical Impression Statement Pt had difficulty tolerating band exercises; reported it hurt his hands/wrists to hold band.  Pt tolerated all other exercises well, without increase in symptoms in back/knees.  Pt continues with some weakness in bilat hip extensors.  Pt has met LTG#5, partially met LTG#2 and had very gradual progress towards remaining goals.    Rehab Potential Good   PT Frequency 2x / week   PT Duration 6 weeks   PT Treatment/Interventions Patient/family education;ADLs/Self Care Home Management;Cryotherapy;Electrical Stimulation;Iontophoresis 1m/ml Dexamethasone;Moist Heat;Ultrasound;Traction;Dry needling;Manual techniques;Therapeutic activities;Therapeutic exercise;Neuromuscular re-education   PT Next Visit Plan Will hold therapy, while pt returns to MD for further testing/recommendations.     Consulted and Agree with Plan of Care Patient      Patient will benefit from skilled therapeutic intervention in order to improve the following deficits and impairments:  Postural dysfunction, Improper body mechanics, Pain, Decreased range of motion, Decreased strength, Decreased mobility, Decreased activity tolerance  Visit Diagnosis: Chronic bilateral low back pain, with sciatica presence unspecified  Other symptoms and signs involving the musculoskeletal system  Weakness generalized     Problem List Patient Active Problem List   Diagnosis Date Noted  . Lumbar degenerative disc disease 07/31/2016  . Chronic anticoagulation 06/04/2016  . Gross hematuria 03/17/2016  . Right calf pain   . DVT, lower extremity, proximal, acute, right ([place] 02/13/2016  . DVT, lower extremity, distal, acute, right ([place] 02/13/2016  . Bilateral pulmonary embolism  ([place] 02/11/2016  . Pulmonary embolism ([place] 02/11/2016  . Shortness of breath 02/08/2016  . Hearing loss due to cerumen impaction, right 01/19/2015  . Acute bronchitis 01/15/2015  . Nephrolithiasis 04/19/2014  . Peripheral neuropathy ([place] 12/15/2013  . Thrombosed external hemorrhoid s/p I&D [id number]/26/2014  . 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 IBS (irritable bowel syndrome) 03/09/2012  . Abdominal pain 05/19/2011  . 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 ABDOMINAL PAIN 11/23/2009  . GERD 10/26/2007  . DIVERTICULOSIS, COLON 10/25/2007  . RENAL CALCULUS 10/25/2007  . HEMORRHOIDS 11/24/2006  . Hyperlipidemia 11/23/2006  . Anxiety state 11/23/2006  . DEPRESSION 11/23/2006  . COLONIC POLYPS, HX OF 11/23/2006   JKerin Perna PTA 09/15/16 1:12 PM  [facility] [address] [address] [address]Phone: [phone]  Fax:  [phone] Name: JPAYNE GARSKEMRN: [id number] of Birth: 1February 16, 1952 PHYSICAL THERAPY DISCHARGE SUMMARY  Visits from Start of Care: 12  Current functional level related to goals / functional outcomes: See progress note for discharge status   Remaining deficits: Some continued pain at last visit   Education / Equipment: HEP Plan: Patient agrees to discharge.  Patient goals were partially met. Patient is being discharged due to not returning since the last visit.  ?????    Celyn P. HHelene KelpPT, MPH 10/28/16 10:05 AM

## 2016-09-15 NOTE — Patient Instructions (Signed)
Abduction: Clam (Eccentric) - Side-Lying    Lie on side with knees bent. Lift top knee, keeping feet together. Keep trunk steady. Slowly lower for 3-5 seconds. _10__ reps per set, _2__ sets per day, _4__ days per week.  Therapeutic - Bridging    Tighten core muscles. Lift buttocks, keeping back straight and arms on floor. Hold __5__ seconds. Repeat __10__ times.  3 part core exercise    With neutral spine, tighten pelvic floor, then tighten abdominal muscles sucking your belly button to back bone, then tighten muscles in low back at waist. Hold 10 seconds. Repeat _10_ times. Do _several__ times a day.  * When you have mastered this, you can contract all 3 muscle groups at same time.   * Progress to do this activity sitting, standing, walking and functional activities.    Supine: Leg Stretch With Strap (Intermediate)    Lie on back with one knee bent, foot flat on floor. Hook strap around other foot. Straighten knee. Raise leg further toward its maximal range. Hold _30__ seconds. Relax leg completely down to floor.  Repeat _2-3__ times per session. Do __2_ sessions per day.

## 2016-09-18 ENCOUNTER — Encounter: Payer: Medicare Other | Admitting: Physical Therapy

## 2016-09-18 ENCOUNTER — Encounter: Payer: Self-pay | Admitting: Vascular Surgery

## 2016-09-19 DIAGNOSIS — G8929 Other chronic pain: Secondary | ICD-10-CM | POA: Diagnosis not present

## 2016-09-19 DIAGNOSIS — M5441 Lumbago with sciatica, right side: Secondary | ICD-10-CM | POA: Diagnosis not present

## 2016-09-19 DIAGNOSIS — M4686 Other specified inflammatory spondylopathies, lumbar region: Secondary | ICD-10-CM | POA: Diagnosis not present

## 2016-09-19 DIAGNOSIS — M5136 Other intervertebral disc degeneration, lumbar region: Secondary | ICD-10-CM | POA: Diagnosis not present

## 2016-09-22 ENCOUNTER — Encounter: Payer: Self-pay | Admitting: Osteopathic Medicine

## 2016-09-22 ENCOUNTER — Ambulatory Visit (INDEPENDENT_AMBULATORY_CARE_PROVIDER_SITE_OTHER): Payer: Medicare Other | Admitting: Osteopathic Medicine

## 2016-09-22 VITALS — BP 106/74 | HR 76 | Ht 71.5 in | Wt 178.0 lb

## 2016-09-22 DIAGNOSIS — R21 Rash and other nonspecific skin eruption: Secondary | ICD-10-CM | POA: Diagnosis not present

## 2016-09-22 NOTE — Patient Instructions (Addendum)
I suspect fungal infection - will await lab results before initiating treatment. Please stop the triamcinolone cream. Please keep your appointment with your demratologist as well. If this starts to look worse, please call us or your dermatologist right away.

## 2016-09-22 NOTE — Progress Notes (Signed)
HPI: David Armstrong is a 66 y.o. male  who presents to [facility] today, 09/22/16,  for chief complaint of:  Chief Complaint  Patient presents with  . Rash    RIGHT LEG    Recently seen by colleague for circular rash on right leg, has similar rash on right buttock. Itching. Steroid medication topical treatment does not seem to have helped.   Past medical history, surgical history, social history and family history reviewed.  Patient Active Problem List   Diagnosis Date Noted  . Lumbar degenerative disc disease 07/31/2016  . Chronic anticoagulation 06/04/2016  . Gross hematuria 03/17/2016  . Right calf pain   . DVT, lower extremity, proximal, acute, right ([place]) 02/13/2016  . DVT, lower extremity, distal, acute, right ([place]) 02/13/2016  . Bilateral pulmonary embolism ([place]) 02/11/2016  . Pulmonary embolism ([place]) 02/11/2016  . Shortness of breath 02/08/2016  . Hearing loss due to cerumen impaction, right 01/19/2015  . Acute bronchitis 01/15/2015  . Nephrolithiasis 04/19/2014  . Peripheral neuropathy ([place]) 12/15/2013  . Thrombosed external hemorrhoid s/p I&D [id number] 06/09/2012  . 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 IBS (irritable bowel syndrome) 03/09/2012  . Abdominal pain 05/19/2011  . 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 ABDOMINAL PAIN 11/23/2009  . GERD 10/26/2007  . DIVERTICULOSIS, COLON 10/25/2007  . RENAL CALCULUS 10/25/2007  . HEMORRHOIDS 11/24/2006  . Hyperlipidemia 11/23/2006  . Anxiety state 11/23/2006  . DEPRESSION 11/23/2006  . COLONIC POLYPS, HX OF 11/23/2006    Current medication list and allergy/intolerance information reviewed.   Current Outpatient Prescriptions on File Prior to Visit  Medication Sig Dispense Refill  . cephALEXin (KEFLEX) 500 MG capsule Take 1 capsule (500 mg total) by mouth 2 (two) times daily. 14 capsule 0  . desonide (DESOWEN) 0.05 % lotion Apply 1 application topically daily as needed (for  skin).    . fluticasone (FLONASE) 50 MCG/ACT nasal spray Place 2 sprays into both nostrils daily as needed for allergies or rhinitis.    . hydrocortisone (ANUSOL-HC) 2.5 % rectal cream Place 1 application rectally at bedtime. At bedtime for 7 days 30 g 1  . hyoscyamine (ANASPAZ) 0.125 MG TBDP disintergrating tablet Place 1 tablet (0.125 mg total) under the tongue every 6 (six) hours as needed. 30 tablet 3  . ipratropium (ATROVENT) 0.06 % nasal spray Place 2 sprays into both nostrils every 4 (four) hours as needed for rhinitis. 10 mL 6  . LORazepam (ATIVAN) 1 MG tablet Take 1 mg by mouth every 8 (eight) hours as needed for anxiety.     Marland Kitchen omeprazole (PRILOSEC) 20 MG capsule Take 1 capsule (20 mg total) by mouth daily. 30 capsule 3  . tiZANidine (ZANAFLEX) 4 MG tablet Take 1 tablet (4 mg total) by mouth every 6 (six) hours as needed for muscle spasms. 30 tablet 0  . traMADol (ULTRAM) 50 MG tablet Take 1 tablet (50 mg total) by mouth 2 (two) times daily as needed for severe pain. 15 tablet 0  . triamcinolone ointment (KENALOG) 0.5 % Apply 1 application topically 2 (two) times daily. To affected area, avoid eyes and face 30 g 0  . XARELTO 20 MG TABS tablet Take 1 tablet (20 mg total) by mouth daily. 30 tablet 0   No current facility-administered medications on file prior to visit.    Allergies  Allergen Reactions  . Dexilant [Dexlansoprazole] Other (See Comments)    Muscle aches  . Ivp Dye [Iodinated Diagnostic Agents] Rash  .  Soma [Carisoprodol] Other (See Comments)    Sores on arm  . Sulfamethoxazole Other (See Comments)    REACTION: unspecified      Review of Systems:  Constitutional: No recent illness  Cardiac: No  chest pain, No  pressure  Respiratory:  No  shortness of breath. No  Cough  Musculoskeletal: No new myalgia/arthralgia  Skin: +Rash  Neurologic: No  weakness, No  Dizziness   Exam:  BP 106/74   Pulse 76   Ht 5' 11.5" (1.816 m)   Wt 178 lb (80.7 kg)   BMI 24.48  kg/m   Constitutional: VS see above. General Appearance: alert, well-developed, well-nourished, NAD  Respiratory: Normal respiratory effort. no wheeze, no rhonchi, no rales  Cardiovascular: S1/S2 normal, no murmur, no rub/gallop auscultated. RRR.   Musculoskeletal: Gait normal. Symmetric and independent movement of all extremities  Neurological: Normal balance/coordination. No tremor.  Skin: warm, dry, intact. Annular rash with fairly distinct borders, scaling, right lower extremity and right buttock. About quarter sized.  Psychiatric: Normal judgment/insight. Anxious mood and affect. Oriented x3.    ASSESSMENT/PLAN:   Looks more to me like possible tinea infection versus unusual form of psoriasis. Patient states that he has upcoming dermatology appointment, advised keep this but we will get fungal stain today for further evaluation and start topical antifungals if this is possible. Advised stop steroid  Rash and nonspecific skin eruption - Plan: Fungal stain    Patient Instructions  I suspect fungal infection - will await lab results before initiating treatment. Please stop the triamcinolone cream. Please keep your appointment with your demratologist as well. If this starts to look worse, please call us or your dermatologist right away.      Follow-up plan: Return for annual physical 01/2017, sooner if needed.  Visit summary with medication list and pertinent instructions was printed for patient to review, alert [place] if any changes needed. All questions at time of visit were answered - patient instructed to contact office with any additional concerns. ER/RTC precautions were reviewed with the patient and understanding verbalized.

## 2016-09-23 DIAGNOSIS — H26491 Other secondary cataract, right eye: Secondary | ICD-10-CM | POA: Diagnosis not present

## 2016-09-23 DIAGNOSIS — H02839 Dermatochalasis of unspecified eye, unspecified eyelid: Secondary | ICD-10-CM | POA: Diagnosis not present

## 2016-09-23 DIAGNOSIS — H18413 Arcus senilis, bilateral: Secondary | ICD-10-CM | POA: Diagnosis not present

## 2016-09-23 DIAGNOSIS — Z961 Presence of intraocular lens: Secondary | ICD-10-CM | POA: Diagnosis not present

## 2016-09-24 LAB — FUNGAL STAIN

## 2016-09-25 DIAGNOSIS — G8929 Other chronic pain: Secondary | ICD-10-CM | POA: Diagnosis not present

## 2016-09-25 DIAGNOSIS — M5441 Lumbago with sciatica, right side: Secondary | ICD-10-CM | POA: Diagnosis not present

## 2016-09-26 ENCOUNTER — Encounter: Payer: Self-pay | Admitting: Vascular Surgery

## 2016-09-26 ENCOUNTER — Ambulatory Visit (INDEPENDENT_AMBULATORY_CARE_PROVIDER_SITE_OTHER): Payer: Medicare Other | Admitting: Vascular Surgery

## 2016-09-26 ENCOUNTER — Ambulatory Visit (INDEPENDENT_AMBULATORY_CARE_PROVIDER_SITE_OTHER)
Admission: RE | Admit: 2016-09-26 | Discharge: 2016-09-26 | Disposition: A | Discharge status: Home / Self Care | Payer: Medicare Other | Source: Ambulatory Visit | Attending: Vascular Surgery | Admitting: Vascular Surgery

## 2016-09-26 ENCOUNTER — Ambulatory Visit (HOSPITAL_COMMUNITY)
Admission: RE | Admit: 2016-09-26 | Discharge: 2016-09-26 | Disposition: A | Discharge status: Home / Self Care | Payer: Medicare Other | Source: Ambulatory Visit | Attending: Vascular Surgery | Admitting: Vascular Surgery

## 2016-09-26 VITALS — BP 110/81 | HR 71 | Temp 97.1°F | Resp 20 | Ht 71.5 in | Wt 175.0 lb

## 2016-09-26 DIAGNOSIS — I82411 Acute embolism and thrombosis of right femoral vein: Secondary | ICD-10-CM

## 2016-09-26 DIAGNOSIS — R9439 Abnormal result of other cardiovascular function study: Secondary | ICD-10-CM | POA: Insufficient documentation

## 2016-09-26 DIAGNOSIS — I872 Venous insufficiency (chronic) (peripheral): Secondary | ICD-10-CM | POA: Insufficient documentation

## 2016-09-26 DIAGNOSIS — Z86718 Personal history of other venous thrombosis and embolism: Secondary | ICD-10-CM | POA: Diagnosis not present

## 2016-09-26 NOTE — Progress Notes (Signed)
Patient ID: David Armstrong, male   DOB: [birth year], 66 y.o.   MRN: [id number]  Reason for Consult: Re-evaluation (3 month f/u )   Referred by Emeterio Reeve, DO  Subjective:     HPI:  David Armstrong is a 66 y.o. male history of extensive right lower extremity DVT last October with bilateral pe. I later saw him for right lower extremity posterior thigh pain that is exacerbated with any touching as well as positional. He has very few alleviating factors states that it is local starting at his buttocks down into his thigh but does not radiate down below his knee. He has remained on Xarelto since October without any bleeding issues. He is not taking aspirin at this time. He has not had further DVT that he is aware of and has no history of DVT and no provocative causes from the DVT either. Pain is unchanged since last visit.  Past Medical History:  Diagnosis Date  . Anal fissure   . ANXIETY   . Arthritis    "lower back; hips;' right knee" (02/11/2016)  . Bilateral pulmonary embolism ([place]) 02/11/2016  . Bipolar disorder ([place])   . Chronic constipation   . Chronic lower back pain   . DEPRESSION   . DIVERTICULOSIS, COLON [phone]   Colonoscopy  . DVT (deep venous thrombosis) ([place]) 02/11/2016   RLE  . Fatty liver   . GERD   . H. pylori infection   . Hemorrhoids [id number]   Colonoscopy   . Hiatal hernia [id number]   EGD  . History of kidney stones   . HYPERLIPIDEMIA   . IBS (irritable bowel syndrome)   . Internal hemorrhoids   . Peripheral neuropathy   . Personal history of colonic polyps 10/27/2007   HYPERPLASTIC POLYP  . Pneumonia 12/2014  . TMJ (temporomandibular joint syndrome)   . Unspecified gastritis and gastroduodenitis without mention of hemorrhage 2009   EGD   Family History  Problem Relation Age of Onset  . Heart disease Mother   . Stroke Mother   . Cirrhosis Father   . Heart disease Father   . Skin cancer Father   . Prostate cancer Maternal  Grandfather   . Parkinson's disease Paternal Grandfather   . Heart disease Brother   . Colon cancer Neg Hx   . Stomach cancer Neg Hx    Past Surgical History:  Procedure Laterality Date  . BELPHAROPTOSIS REPAIR Bilateral 01/2015   "droopy eyelids"  . CYSTOSCOPY/RETROGRADE/URETEROSCOPY/STONE EXTRACTION WITH BASKET  1987; 1992; 1999; 2010  . EXCISIONAL HEMORRHOIDECTOMY    . FOOT NEUROMA SURGERY Left 09/2015  . FOOT NEUROMA SURGERY Right 07/2015  . FOOT NEUROMA SURGERY Right 2004   "between big and 2nd toes; shortened my 2nd toe"  . FRACTURE SURGERY    . HAMMER TOE SURGERY Left    "little toe"  . INCISION AND DRAINAGE FOOT Left 1992   "ball of foot; stepped on piece of hard wire & it went thru my shoe"  . KNEE ARTHROSCOPY Bilateral   . LITHOTRIPSY  1992  . NASAL FRACTURE SURGERY  1997  . PARTIAL KNEE ARTHROPLASTY Right 06/2003  . PLANTAR FASCIA RELEASE Bilateral 1987-1993   right-left  . RHINOPLASTY  2000  . SHOULDER ARTHROSCOPY Left 03/2011; 2013  . SHOULDER ARTHROSCOPY Right 2007   "frozen shoulder"  . TOENAIL EXCISION Bilateral 2012   "big toes"    Short Social History:  Social History  Substance Use Topics  . Smoking status: Never Smoker  .  Smokeless tobacco: Never Used  . Alcohol use No    Allergies  Allergen Reactions  . Dexilant [Dexlansoprazole] Other (See Comments)    Muscle aches  . Ivp Dye [Iodinated Diagnostic Agents] Rash  . Soma [Carisoprodol] Other (See Comments)    Sores on arm  . Sulfamethoxazole Other (See Comments)    REACTION: unspecified    Current Outpatient Prescriptions  Medication Sig Dispense Refill  . cephALEXin (KEFLEX) 500 MG capsule Take 1 capsule (500 mg total) by mouth 2 (two) times daily. 14 capsule 0  . desonide (DESOWEN) 0.05 % lotion Apply 1 application topically daily as needed (for skin).    . fluticasone (FLONASE) 50 MCG/ACT nasal spray Place 2 sprays into both nostrils daily as needed for allergies or rhinitis.    .  hydrocortisone (ANUSOL-HC) 2.5 % rectal cream Place 1 application rectally at bedtime. At bedtime for 7 days 30 g 1  . hyoscyamine (ANASPAZ) 0.125 MG TBDP disintergrating tablet Place 1 tablet (0.125 mg total) under the tongue every 6 (six) hours as needed. 30 tablet 3  . ipratropium (ATROVENT) 0.06 % nasal spray Place 2 sprays into both nostrils every 4 (four) hours as needed for rhinitis. 10 mL 6  . LORazepam (ATIVAN) 1 MG tablet Take 1 mg by mouth every 8 (eight) hours as needed for anxiety.     Marland Kitchen omeprazole (PRILOSEC) 20 MG capsule Take 1 capsule (20 mg total) by mouth daily. 30 capsule 3  . tiZANidine (ZANAFLEX) 4 MG tablet Take 1 tablet (4 mg total) by mouth every 6 (six) hours as needed for muscle spasms. 30 tablet 0  . traMADol (ULTRAM) 50 MG tablet Take 1 tablet (50 mg total) by mouth 2 (two) times daily as needed for severe pain. 15 tablet 0  . triamcinolone ointment (KENALOG) 0.5 % Apply 1 application topically 2 (two) times daily. To affected area, avoid eyes and face 30 g 0  . XARELTO 20 MG TABS tablet Take 1 tablet (20 mg total) by mouth daily. 30 tablet 0   No current facility-administered medications for this visit.     Review of Systems  Constitutional:  Constitutional negative. HENT: HENT negative.  Eyes: Eyes negative.  Respiratory: Respiratory negative.  Cardiovascular: Cardiovascular negative.  GI: Gastrointestinal negative.  Musculoskeletal: Positive for back pain and leg pain.  Neurological: Neurological negative. Hematologic: Hematologic/lymphatic negative.  Psychiatric: Positive for depressed mood.        Objective:  Objective   Vitals:   09/26/16 1006  BP: 110/81  Pulse: 71  Resp: 20  Temp: 97.1 F (36.2 C)  TempSrc: Oral  SpO2: 98%  Weight: 175 lb (79.4 kg)  Height: 5' 11.5" (1.816 m)   Body mass index is 24.07 kg/m.  Physical Exam  Constitutional: He is oriented to person, place, and time. He appears well-developed.  HENT:  Head:  Normocephalic.  Eyes: Pupils are equal, round, and reactive to light.  Neck: Normal range of motion.  Cardiovascular: Normal rate.   Pulses:      Radial pulses are 2+ on the right side, and 2+ on the left side.       Popliteal pulses are 2+ on the right side, and 2+ on the left side.       Dorsalis pedis pulses are 2+ on the right side, and 2+ on the left side.  Pulmonary/Chest: Effort normal.  Abdominal: Soft. He exhibits no mass.  Musculoskeletal: He exhibits no edema.  Spider veins bilaterally, no varicosities  Neurological: He  is alert and oriented to person, place, and time.  Skin: Skin is warm and dry.  Psychiatric: He has a normal mood and affect. His behavior is normal. Judgment and thought content normal.    Data: I've independently interpreted his IVC iliac duplex today which demonstrates that he does have some reflux in his deep system of the right leg with a right common iliac vein but does have flow with deep inspiration but possibly represents chronic thrombus. There is no apparent compression in the IVC appears patent.     Assessment/Plan:     66yo male returns for right lower extremity pain with history of extensive right lower extremity DVT. He remains on Xarelto for this and I have counseled him that though I do not know causes DVT he really should be able to stop at least after one year which would be October. From a DVT standpoint he is to remain on at least baby aspirin for the rest of his life and should not take long car or airplane rides without interrupting every 2 hours with walking. Also counseled him that I believe his pain is multifactorial and not caused by any central venous obstructive syndromes. As such there is no vascular intervention needed at this time. Should he have further clotting or vein issues in the future we can certainly see him back he will otherwise follow-up on a when necessary basis.       Waynetta Sandy MD [facility]

## 2016-09-30 DIAGNOSIS — H26491 Other secondary cataract, right eye: Secondary | ICD-10-CM | POA: Diagnosis not present

## 2016-10-01 DIAGNOSIS — M9905 Segmental and somatic dysfunction of pelvic region: Secondary | ICD-10-CM | POA: Diagnosis not present

## 2016-10-01 DIAGNOSIS — M9903 Segmental and somatic dysfunction of lumbar region: Secondary | ICD-10-CM | POA: Diagnosis not present

## 2016-10-01 DIAGNOSIS — M9904 Segmental and somatic dysfunction of sacral region: Secondary | ICD-10-CM | POA: Diagnosis not present

## 2016-10-01 DIAGNOSIS — M5430 Sciatica, unspecified side: Secondary | ICD-10-CM | POA: Diagnosis not present

## 2016-10-02 DIAGNOSIS — M9903 Segmental and somatic dysfunction of lumbar region: Secondary | ICD-10-CM | POA: Diagnosis not present

## 2016-10-02 DIAGNOSIS — M9904 Segmental and somatic dysfunction of sacral region: Secondary | ICD-10-CM | POA: Diagnosis not present

## 2016-10-02 DIAGNOSIS — M5430 Sciatica, unspecified side: Secondary | ICD-10-CM | POA: Diagnosis not present

## 2016-10-02 DIAGNOSIS — M9905 Segmental and somatic dysfunction of pelvic region: Secondary | ICD-10-CM | POA: Diagnosis not present

## 2016-10-03 ENCOUNTER — Other Ambulatory Visit: Payer: Self-pay | Admitting: Osteopathic Medicine

## 2016-10-03 DIAGNOSIS — M4686 Other specified inflammatory spondylopathies, lumbar region: Secondary | ICD-10-CM | POA: Diagnosis not present

## 2016-10-03 DIAGNOSIS — K219 Gastro-esophageal reflux disease without esophagitis: Secondary | ICD-10-CM

## 2016-10-03 DIAGNOSIS — M5136 Other intervertebral disc degeneration, lumbar region: Secondary | ICD-10-CM | POA: Diagnosis not present

## 2016-10-03 DIAGNOSIS — M5441 Lumbago with sciatica, right side: Secondary | ICD-10-CM | POA: Diagnosis not present

## 2016-10-03 DIAGNOSIS — G8929 Other chronic pain: Secondary | ICD-10-CM | POA: Diagnosis not present

## 2016-10-06 DIAGNOSIS — M9904 Segmental and somatic dysfunction of sacral region: Secondary | ICD-10-CM | POA: Diagnosis not present

## 2016-10-06 DIAGNOSIS — M9905 Segmental and somatic dysfunction of pelvic region: Secondary | ICD-10-CM | POA: Diagnosis not present

## 2016-10-06 DIAGNOSIS — M5430 Sciatica, unspecified side: Secondary | ICD-10-CM | POA: Diagnosis not present

## 2016-10-06 DIAGNOSIS — M9903 Segmental and somatic dysfunction of lumbar region: Secondary | ICD-10-CM | POA: Diagnosis not present

## 2016-10-07 DIAGNOSIS — L570 Actinic keratosis: Secondary | ICD-10-CM | POA: Diagnosis not present

## 2016-10-07 DIAGNOSIS — L219 Seborrheic dermatitis, unspecified: Secondary | ICD-10-CM | POA: Diagnosis not present

## 2016-10-08 DIAGNOSIS — M5430 Sciatica, unspecified side: Secondary | ICD-10-CM | POA: Diagnosis not present

## 2016-10-08 DIAGNOSIS — M9905 Segmental and somatic dysfunction of pelvic region: Secondary | ICD-10-CM | POA: Diagnosis not present

## 2016-10-08 DIAGNOSIS — M9903 Segmental and somatic dysfunction of lumbar region: Secondary | ICD-10-CM | POA: Diagnosis not present

## 2016-10-08 DIAGNOSIS — M9904 Segmental and somatic dysfunction of sacral region: Secondary | ICD-10-CM | POA: Diagnosis not present

## 2016-10-09 DIAGNOSIS — R509 Fever, unspecified: Secondary | ICD-10-CM | POA: Diagnosis not present

## 2016-10-13 DIAGNOSIS — M9905 Segmental and somatic dysfunction of pelvic region: Secondary | ICD-10-CM | POA: Diagnosis not present

## 2016-10-13 DIAGNOSIS — M5430 Sciatica, unspecified side: Secondary | ICD-10-CM | POA: Diagnosis not present

## 2016-10-13 DIAGNOSIS — M9903 Segmental and somatic dysfunction of lumbar region: Secondary | ICD-10-CM | POA: Diagnosis not present

## 2016-10-13 DIAGNOSIS — M9904 Segmental and somatic dysfunction of sacral region: Secondary | ICD-10-CM | POA: Diagnosis not present

## 2016-10-16 DIAGNOSIS — M9904 Segmental and somatic dysfunction of sacral region: Secondary | ICD-10-CM | POA: Diagnosis not present

## 2016-10-16 DIAGNOSIS — M9903 Segmental and somatic dysfunction of lumbar region: Secondary | ICD-10-CM | POA: Diagnosis not present

## 2016-10-16 DIAGNOSIS — M9905 Segmental and somatic dysfunction of pelvic region: Secondary | ICD-10-CM | POA: Diagnosis not present

## 2016-10-16 DIAGNOSIS — M5430 Sciatica, unspecified side: Secondary | ICD-10-CM | POA: Diagnosis not present

## 2016-10-20 DIAGNOSIS — M9905 Segmental and somatic dysfunction of pelvic region: Secondary | ICD-10-CM | POA: Diagnosis not present

## 2016-10-20 DIAGNOSIS — M9903 Segmental and somatic dysfunction of lumbar region: Secondary | ICD-10-CM | POA: Diagnosis not present

## 2016-10-20 DIAGNOSIS — M9904 Segmental and somatic dysfunction of sacral region: Secondary | ICD-10-CM | POA: Diagnosis not present

## 2016-10-20 DIAGNOSIS — M5430 Sciatica, unspecified side: Secondary | ICD-10-CM | POA: Diagnosis not present

## 2016-10-22 DIAGNOSIS — M9905 Segmental and somatic dysfunction of pelvic region: Secondary | ICD-10-CM | POA: Diagnosis not present

## 2016-10-22 DIAGNOSIS — M5430 Sciatica, unspecified side: Secondary | ICD-10-CM | POA: Diagnosis not present

## 2016-10-22 DIAGNOSIS — M9904 Segmental and somatic dysfunction of sacral region: Secondary | ICD-10-CM | POA: Diagnosis not present

## 2016-10-22 DIAGNOSIS — M9903 Segmental and somatic dysfunction of lumbar region: Secondary | ICD-10-CM | POA: Diagnosis not present

## 2016-10-23 DIAGNOSIS — M9905 Segmental and somatic dysfunction of pelvic region: Secondary | ICD-10-CM | POA: Diagnosis not present

## 2016-10-23 DIAGNOSIS — M5430 Sciatica, unspecified side: Secondary | ICD-10-CM | POA: Diagnosis not present

## 2016-10-23 DIAGNOSIS — M9904 Segmental and somatic dysfunction of sacral region: Secondary | ICD-10-CM | POA: Diagnosis not present

## 2016-10-23 DIAGNOSIS — M9903 Segmental and somatic dysfunction of lumbar region: Secondary | ICD-10-CM | POA: Diagnosis not present

## 2016-10-27 DIAGNOSIS — M5430 Sciatica, unspecified side: Secondary | ICD-10-CM | POA: Diagnosis not present

## 2016-10-27 DIAGNOSIS — M9904 Segmental and somatic dysfunction of sacral region: Secondary | ICD-10-CM | POA: Diagnosis not present

## 2016-10-27 DIAGNOSIS — M9905 Segmental and somatic dysfunction of pelvic region: Secondary | ICD-10-CM | POA: Diagnosis not present

## 2016-10-27 DIAGNOSIS — M9903 Segmental and somatic dysfunction of lumbar region: Secondary | ICD-10-CM | POA: Diagnosis not present

## 2016-11-03 ENCOUNTER — Telehealth: Payer: Self-pay | Admitting: Family Medicine

## 2016-11-03 DIAGNOSIS — M5430 Sciatica, unspecified side: Secondary | ICD-10-CM | POA: Diagnosis not present

## 2016-11-03 DIAGNOSIS — M9904 Segmental and somatic dysfunction of sacral region: Secondary | ICD-10-CM | POA: Diagnosis not present

## 2016-11-03 DIAGNOSIS — M9903 Segmental and somatic dysfunction of lumbar region: Secondary | ICD-10-CM | POA: Diagnosis not present

## 2016-11-03 DIAGNOSIS — M9905 Segmental and somatic dysfunction of pelvic region: Secondary | ICD-10-CM | POA: Diagnosis not present

## 2016-11-03 NOTE — Telephone Encounter (Signed)
Called patient to let him know he could establish care with Juleen China and transfer to [place] in August. Left message on phone number [phone] -hs

## 2016-11-06 DIAGNOSIS — M9903 Segmental and somatic dysfunction of lumbar region: Secondary | ICD-10-CM | POA: Diagnosis not present

## 2016-11-06 DIAGNOSIS — M9904 Segmental and somatic dysfunction of sacral region: Secondary | ICD-10-CM | POA: Diagnosis not present

## 2016-11-06 DIAGNOSIS — M5430 Sciatica, unspecified side: Secondary | ICD-10-CM | POA: Diagnosis not present

## 2016-11-06 DIAGNOSIS — M9905 Segmental and somatic dysfunction of pelvic region: Secondary | ICD-10-CM | POA: Diagnosis not present

## 2016-11-10 DIAGNOSIS — M9904 Segmental and somatic dysfunction of sacral region: Secondary | ICD-10-CM | POA: Diagnosis not present

## 2016-11-10 DIAGNOSIS — M5430 Sciatica, unspecified side: Secondary | ICD-10-CM | POA: Diagnosis not present

## 2016-11-10 DIAGNOSIS — M9905 Segmental and somatic dysfunction of pelvic region: Secondary | ICD-10-CM | POA: Diagnosis not present

## 2016-11-10 DIAGNOSIS — M9903 Segmental and somatic dysfunction of lumbar region: Secondary | ICD-10-CM | POA: Diagnosis not present

## 2016-11-11 ENCOUNTER — Emergency Department (HOSPITAL_BASED_OUTPATIENT_CLINIC_OR_DEPARTMENT_OTHER)
Admit: 2016-11-11 | Discharge: 2016-11-11 | Disposition: A | Discharge status: Home / Self Care | Payer: Medicare Other | Attending: Physician Assistant | Admitting: Physician Assistant

## 2016-11-11 ENCOUNTER — Emergency Department (HOSPITAL_BASED_OUTPATIENT_CLINIC_OR_DEPARTMENT_OTHER)
Admission: EM | Admit: 2016-11-11 | Discharge: 2016-11-11 | Discharge status: Against Medical Advice | Payer: Medicare Other | Attending: Emergency Medicine | Admitting: Emergency Medicine

## 2016-11-11 ENCOUNTER — Encounter: Payer: Self-pay | Admitting: Physician Assistant

## 2016-11-11 ENCOUNTER — Ambulatory Visit (HOSPITAL_BASED_OUTPATIENT_CLINIC_OR_DEPARTMENT_OTHER)
Admission: RE | Admit: 2016-11-11 | Discharge: 2016-11-11 | Disposition: A | Discharge status: Home / Self Care | Payer: Medicare Other | Source: Ambulatory Visit | Attending: Physician Assistant | Admitting: Physician Assistant

## 2016-11-11 ENCOUNTER — Encounter (HOSPITAL_BASED_OUTPATIENT_CLINIC_OR_DEPARTMENT_OTHER): Payer: Self-pay | Admitting: Emergency Medicine

## 2016-11-11 ENCOUNTER — Ambulatory Visit (INDEPENDENT_AMBULATORY_CARE_PROVIDER_SITE_OTHER): Payer: Medicare Other | Admitting: Physician Assistant

## 2016-11-11 VITALS — BP 120/82 | HR 85 | Temp 97.8°F | Ht 71.5 in | Wt 177.5 lb

## 2016-11-11 DIAGNOSIS — M79661 Pain in right lower leg: Secondary | ICD-10-CM | POA: Diagnosis not present

## 2016-11-11 DIAGNOSIS — G8929 Other chronic pain: Secondary | ICD-10-CM

## 2016-11-11 DIAGNOSIS — F411 Generalized anxiety disorder: Secondary | ICD-10-CM | POA: Diagnosis not present

## 2016-11-11 DIAGNOSIS — M545 Low back pain: Principal | ICD-10-CM

## 2016-11-11 DIAGNOSIS — Z86718 Personal history of other venous thrombosis and embolism: Secondary | ICD-10-CM

## 2016-11-11 DIAGNOSIS — R21 Rash and other nonspecific skin eruption: Secondary | ICD-10-CM | POA: Diagnosis not present

## 2016-11-11 DIAGNOSIS — M5440 Lumbago with sciatica, unspecified side: Secondary | ICD-10-CM | POA: Insufficient documentation

## 2016-11-11 NOTE — ED Notes (Signed)
Pt was suppose to have an OP [place] of his right leg. Xray is expecting him. Dr Johnney Killian aware. States send pt to his scheduled OP testing.

## 2016-11-11 NOTE — Patient Instructions (Signed)
It was great to meet you!  You will be contacted about your ultrasound.  Continue baby aspirin.  If you develop chest pain, shortness of breath, or worsening pain --> please go to the ER!!

## 2016-11-11 NOTE — ED Triage Notes (Signed)
patent states that her has a DVT study last month and was told to stop taking his xorelto. The patient reports that he is concerned because he now has pain in this right calf, and swelling. Was sent here from his PMD

## 2016-11-11 NOTE — Progress Notes (Addendum)
David Armstrong is a 66 y.o. male here to [place] and discuss back issue.  I acted as a Education administrator for [employer], PA-C Anselmo Pickler, LPN  History of Present Illness:   Chief Complaint  Patient presents with  . Establish Care    Medicare  . Discuss back issue    Chronic Issues: Chronic back pain -- per chart review, this has been a chronic issue for him. For several years during his early career around 1975 he worked in a [employer] and had several issues with Midwife and has had back pain since that time. In 1983 he claims that he had a limp in addition to his chronic back pain but this resolved with regular chiropractor visit. He has been seen by sports medicine and his PCP for this during this year for his lumbar DDD. He has undergone PT and reports that Dr. Nelva Bush of [facility] ordered a lumbar MRI however I do not have these images to review (we are requesting records.) Most recent imaging available to me is a DG lumbar spine xray which shows "degenerative disc disease L5-S1 with multilevel degenerative facet arthropathy. No acute compression fracture of the lumbar vertebrae.Multiple left-sided renal calculi." Planning to go to an appointment with neurology in two days so "they can put another injection in my back." His most recent injection was back in September 2017. He denies any urinary issues, incontinence of bowel/bladder, numbness/tingling of lower extremities, or significant changes in his back pain. He has not taken anything for his back pain. He has a prescription of Tramadol that he has not taken recently. Also, he is very concerned and asks multiple times during our visit if I think he has a DVT and asks if his pain is related to that. Per chart review, he had a similar visit with his PCP on 08/06/16 where he had back pain and he was concerned about DVT. Interestingly, that provider wrote at that time "Pt is anxious about the blood clot causing his pain. I  have reassured him on multiple occasions that he is on appropriate DVT treatment and we have consulted other specialists as well to evaluate for unusual causes. 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 Most recent vascular note from Dr. Donzetta Matters on 09/26/16 stated that he should remain on Xarelto x 1 year, but he stopped in June 2018 per his own discretion and is now only on a baby ASA. Had feet surgery last spring and summer and thinks that this is what caused the DVT. Denies further calf swelling but does admit to calf pain in the R leg intermittently over the past week or so. Also endorses pain to the back of his R leg. Rash -- has a 2-3 cm area of erythema located on R calf; was evaluated by dermatology but patient unable to tell me his diagnosis "maybe dermatitis." A fungal stain was done by his PCP and was negative. He is unable to tell me the treatment he was prescribed for this by his dermatologist. He reports slow improvement of rash. Anxiety state -- managed by psych. Has significant health anxiety, and reports that he takes 1 mg Ativan  prn to help with this.  Health Maintenance: Immunizations -- up to date Weight -- Weight: 177 lb 8 oz (80.5 kg)   Depression screen [facility] 2/9 11/11/2016  Decreased Interest 0  Down, Depressed, Hopeless 1  PHQ - 2 Score 1    No flowsheet data found.  Other providers/specialists: Dr. Leavy Cella -- psych, manages anxiety, depression, bipolar; sees every 6 months Dr. Donzetta Matters -- vascular, no longer sees regularly, only prn Dr. Nelva Bush -- physiatrist at [facility] Dr. Trula Ore -- neurologist Dr. Darrall Dears (?) -- dermatologist  Past Medical  History:  Diagnosis Date  . Anal fissure   . Arthritis    "lower back; hips;' right knee" (02/11/2016)  . Bilateral pulmonary embolism ([place]) 02/11/2016  . Bipolar disorder ([place])   . Chronic constipation   . DEPRESSION   . DIVERTICULOSIS, COLON [phone]   Colonoscopy  . Fatty liver   . GERD   . H. pylori infection   . Hiatal hernia [id number]   EGD  . IBS (irritable bowel syndrome)   . Peripheral neuropathy   . Personal history of colonic polyps 10/27/2007   HYPERPLASTIC POLYP  . Pneumonia 12/2014  . TMJ (temporomandibular joint syndrome)   . Unspecified gastritis and gastroduodenitis without mention of hemorrhage 2009   EGD     Social History   Social History  . Marital status: Divorced    Spouse name: N/A  . Number of children: 0  . Years of education: N/A   Occupational History  . retired [employer]    disabled now   Social History Main Topics  . Smoking status: Never Smoker  . Smokeless tobacco: Never Used  . Alcohol use No  . Drug use: No  . Sexual activity: No   Other Topics Concern  . Not on file   Social History Narrative   Goes by "[name]"   Divorced   No children   Retired from the post office    Past Surgical History:  Procedure Laterality Date  . BELPHAROPTOSIS REPAIR Bilateral 01/2015   "droopy eyelids"  . CYSTOSCOPY/RETROGRADE/URETEROSCOPY/STONE EXTRACTION WITH BASKET  1987; 1992; 1999; 2010  . EXCISIONAL HEMORRHOIDECTOMY    . FOOT NEUROMA SURGERY Left 09/2015  . FOOT NEUROMA SURGERY Right 07/2015  . FOOT NEUROMA SURGERY Right 2004   "between big and 2nd toes; shortened my 2nd toe"  . FRACTURE SURGERY    . HAMMER TOE SURGERY Left    "little toe"  . INCISION AND DRAINAGE FOOT Left 1992   "ball of foot; stepped on piece of hard wire & it went thru my shoe"  . KNEE ARTHROSCOPY Bilateral   . LITHOTRIPSY  1992  . NASAL FRACTURE SURGERY  1997  . PARTIAL KNEE ARTHROPLASTY Right 06/2003  . PLANTAR FASCIA RELEASE Bilateral 1987-1993    right-left  . RHINOPLASTY  2000  . SHOULDER ARTHROSCOPY Left 03/2011; 2013  . SHOULDER ARTHROSCOPY Right 2007   "frozen shoulder"  . TOENAIL EXCISION Bilateral 2012   "big toes"    Family History  Problem Relation Age of Onset  . Heart disease Mother   . Stroke Mother   . Cirrhosis Father   . Heart disease Father   . Skin cancer Father   . Prostate cancer Maternal Grandfather   . Parkinson's disease Paternal Grandfather   . Heart disease Brother   . Colon cancer Neg Hx   . Stomach cancer Neg Hx     Allergies  Allergen Reactions  . Dexilant [Dexlansoprazole] Other (See Comments)  Muscle aches  . Ivp Dye [Iodinated Diagnostic Agents] Rash  . Soma [Carisoprodol] Other (See Comments)    Sores on arm  . Sulfamethoxazole Other (See Comments)    REACTION: unspecified     Current Medications:   Current Outpatient Prescriptions:  .  aspirin EC 81 MG tablet, Take 81 mg by mouth daily., Disp: , Rfl:  .  desonide (DESOWEN) 0.05 % lotion, Apply 1 application topically daily as needed (for skin)., Disp: , Rfl:  .  fluticasone (CUTIVATE) 0.05 % cream, apply to affected area twice a day if needed, Disp: , Rfl: 0 .  fluticasone (FLONASE) 50 MCG/ACT nasal spray, Place 2 sprays into both nostrils daily as needed for allergies or rhinitis., Disp: , Rfl:  .  hydrocortisone (ANUSOL-HC) 2.5 % rectal cream, Place 1 application rectally at bedtime. At bedtime for 7 days, Disp: 30 g, Rfl: 1 .  LORazepam (ATIVAN) 1 MG tablet, Take 1 mg by mouth every 8 (eight) hours as needed for anxiety. , Disp: , Rfl:  .  omeprazole (PRILOSEC) 20 MG capsule, take 1 capsule by mouth once daily, Disp: 30 capsule, Rfl: 3 .  hyoscyamine (ANASPAZ) 0.125 MG TBDP disintergrating tablet, Place 1 tablet (0.125 mg total) under the tongue every 6 (six) hours as needed. (Patient not taking: Reported on 11/11/2016), Disp: 30 tablet, Rfl: 3 .  tiZANidine (ZANAFLEX) 4 MG tablet, Take 1 tablet (4 mg total) by mouth every 6  (six) hours as needed for muscle spasms. (Patient not taking: Reported on 11/11/2016), Disp: 30 tablet, Rfl: 0 .  traMADol (ULTRAM) 50 MG tablet, Take 1 tablet (50 mg total) by mouth 2 (two) times daily as needed for severe pain. (Patient not taking: Reported on 11/11/2016), Disp: 15 tablet, Rfl: 0 .  XARELTO 20 MG TABS tablet, Take 1 tablet (20 mg total) by mouth daily. (Patient not taking: Reported on 11/11/2016), Disp: 30 tablet, Rfl: 0   Review of Systems:   Review of Systems  Constitutional: Negative for chills, fever, malaise/fatigue and weight loss.  Respiratory: Negative for shortness of breath.   Cardiovascular: Negative for chest pain, palpitations, orthopnea and PND.  Genitourinary: Negative for dysuria and urgency.  Musculoskeletal: Positive for back pain.  Skin: Positive for rash.  Neurological: Negative for dizziness, tingling, tremors and headaches.  Psychiatric/Behavioral: The patient is nervous/anxious.     Vitals:   Vitals:   11/11/16 1437  BP: 120/82  Pulse: 85  Temp: 97.8 F (36.6 C)  TempSrc: Oral  SpO2: 97%  Weight: 177 lb 8 oz (80.5 kg)  Height: 5' 11.5" (1.816 m)     Body mass index is 24.41 kg/m.  Physical Exam:   Physical Exam  Constitutional: He appears well-developed. He is cooperative.  Non-toxic appearance. He does not have a sickly appearance. He does not appear ill. No distress.  Cardiovascular: Normal rate, regular rhythm, S1 normal, S2 normal, normal heart sounds and normal pulses.   No LE edema, distal pulses present  Pulmonary/Chest: Effort normal and breath sounds normal.  Musculoskeletal:  No bony tenderness to vertebrae. Full ROM without pain. Tenderness to deep palpation of lumbar paraspinal muscles.   Tenderness to deep palpation of R calf muscle. No warmth/erythema. Calves measuring equal sizes.  Neurological: He is alert. He has normal strength. No cranial nerve deficit or sensory deficit. Coordination and gait normal. GCS eye  subscore is 4. GCS verbal subscore is 5. GCS motor subscore is 6.  Reflex Scores:      Patellar reflexes  are 2+ on the right side and 2+ on the left side.      Achilles reflexes are 2+ on the right side and 2+ on the left side. Skin: Skin is warm, dry and intact.  2-3 cm area of erythema to distal R calf, no fluctuance or induration, no evidence of screening  Psychiatric: His speech is normal and behavior is normal. His mood appears anxious.  Nursing note and vitals reviewed.   Assessment and Plan:    Trapper was seen today for establish care and discuss back issue.  Diagnoses and all orders for this visit:  Chronic bilateral low back pain, with sciatica presence unspecified He denies any changes in chronic back pain and is perseverating on the idea that his back pain is probably caused by his DVT. Exam benign. Has appointment with his neurologist on Thursday (2 days from now) for further evaluation and treatment of his back pain. Will check UA to r/o urinary etiology. Discussed that he may take Tramadol prescription, as directed, for any breakthrough pain. -     Urinalysis, Routine w reflex microscopic -     US Venous Img Lower Bilateral; Future  History of DVT (deep vein thrombosis) and Right calf pain Dr. Briscoe Deutscher in to evaluate patient. Given tenderness to R calf and recent discontinuation of Xarelto despite medical advice, discussed US venous bilateral lower extremity. At first patient was agreeable to doing this as an outpatient, but when he found out that the ultrasound couldn't be scheduled until tomorrow, he decided that he would go to the ER for further evaluation. Will discuss Xarelto with patient after ED visit. -     US Venous Img Lower Bilateral; Future  Anxiety state Patient has significant health anxiety and concerns about reoccurring DVT. Encouraged patient to reach out to psych MD for further care regarding his anxiety if he feels as it continues to be  uncontrolled.  Rash Per patient report, appears to be slowly improving. Follow-up with Dr. [name] or dermatologist for further evaluation.  Patient has requested to transfer to a male provider. He has a scheduled follow-up with Dr. [name] in 2-3 weeks for establish care. Per chart review, physical exam is due in Oct 2018. (He reports that he has had Medicare for 5 years, and is thus not eligible for Welcome to Medicare per his report.)  . Reviewed expectations re: course of current medical issues. . Discussed self-management of symptoms. . Outlined signs and symptoms indicating need for more acute intervention. . Patient verbalized understanding and all questions were answered. . See orders for this visit as documented in the electronic medical record. . Patient received an After-Visit Summary.  CMA or LPN served as scribe during this visit. History, Physical, and Plan performed by medical provider. Documentation and orders reviewed and attested to.  Inda Coke, PA-C

## 2016-11-12 ENCOUNTER — Other Ambulatory Visit: Payer: Self-pay | Admitting: Physician Assistant

## 2016-11-12 ENCOUNTER — Other Ambulatory Visit: Payer: Medicare Other

## 2016-11-12 ENCOUNTER — Telehealth: Payer: Self-pay

## 2016-11-12 DIAGNOSIS — R311 Benign essential microscopic hematuria: Secondary | ICD-10-CM | POA: Diagnosis not present

## 2016-11-12 LAB — URINALYSIS, ROUTINE W REFLEX MICROSCOPIC
BILIRUBIN URINE: NEGATIVE
Ketones, ur: NEGATIVE
LEUKOCYTES UA: NEGATIVE
Nitrite: NEGATIVE
PH: 6 (ref 5.0–8.0)
Specific Gravity, Urine: >=1.03 — AB (ref 1.000–1.030)
TOTAL PROTEIN, URINE-UPE24: NEGATIVE
Urine Glucose: NEGATIVE
Urobilinogen, UA: 0.2 (ref 0.0–1.0)

## 2016-11-12 NOTE — Telephone Encounter (Signed)
Pharmacist, community at [place] Client Site [place] [facility] [place] Night Physician [place], [place] Type Call Who Is Calling Physician / Provider / Hospital Call Type Provider Call [facility] Page Now Reason for Call Request to speak to Physician Initial Comment Caller states that she is needing to speak to the on call about a stat ultra sound. Additional Comment Patient Name David Armstrong Patient DOB October 08, 1950 Requesting Provider Oneita Kras Physician Number [phone] Facility Name [facility] Phone DateTime Result/Outcome Message Type Notes [name] [phone] 11/11/2016 7:36:02 PM Called On Call Provider - Reached Doctor Paged Billey Gosling - MD 11/11/2016 7:36:11 PM Spoke with On Call - General Message Result Connected on call with caller. Call Closed By: Renato Shin Transaction Date/Time: 11/11/2016 7:31:11 PM (ET)

## 2016-11-13 ENCOUNTER — Ambulatory Visit: Payer: Medicare Other | Admitting: Family Medicine

## 2016-11-13 DIAGNOSIS — M9904 Segmental and somatic dysfunction of sacral region: Secondary | ICD-10-CM | POA: Diagnosis not present

## 2016-11-13 DIAGNOSIS — M9903 Segmental and somatic dysfunction of lumbar region: Secondary | ICD-10-CM | POA: Diagnosis not present

## 2016-11-13 DIAGNOSIS — G5622 Lesion of ulnar nerve, left upper limb: Secondary | ICD-10-CM | POA: Diagnosis not present

## 2016-11-13 DIAGNOSIS — R200 Anesthesia of skin: Secondary | ICD-10-CM | POA: Diagnosis not present

## 2016-11-13 DIAGNOSIS — M5412 Radiculopathy, cervical region: Secondary | ICD-10-CM | POA: Diagnosis not present

## 2016-11-13 DIAGNOSIS — M9905 Segmental and somatic dysfunction of pelvic region: Secondary | ICD-10-CM | POA: Diagnosis not present

## 2016-11-13 DIAGNOSIS — M5481 Occipital neuralgia: Secondary | ICD-10-CM | POA: Diagnosis not present

## 2016-11-13 DIAGNOSIS — G603 Idiopathic progressive neuropathy: Secondary | ICD-10-CM | POA: Diagnosis not present

## 2016-11-13 DIAGNOSIS — R252 Cramp and spasm: Secondary | ICD-10-CM | POA: Diagnosis not present

## 2016-11-13 DIAGNOSIS — G5603 Carpal tunnel syndrome, bilateral upper limbs: Secondary | ICD-10-CM | POA: Diagnosis not present

## 2016-11-13 DIAGNOSIS — M5417 Radiculopathy, lumbosacral region: Secondary | ICD-10-CM | POA: Diagnosis not present

## 2016-11-13 DIAGNOSIS — M5430 Sciatica, unspecified side: Secondary | ICD-10-CM | POA: Diagnosis not present

## 2016-11-14 DIAGNOSIS — N200 Calculus of kidney: Secondary | ICD-10-CM | POA: Diagnosis not present

## 2016-11-14 DIAGNOSIS — R3121 Asymptomatic microscopic hematuria: Secondary | ICD-10-CM | POA: Diagnosis not present

## 2016-11-16 ENCOUNTER — Other Ambulatory Visit: Payer: Self-pay | Admitting: Nurse Practitioner

## 2016-11-17 DIAGNOSIS — M9905 Segmental and somatic dysfunction of pelvic region: Secondary | ICD-10-CM | POA: Diagnosis not present

## 2016-11-17 DIAGNOSIS — M9903 Segmental and somatic dysfunction of lumbar region: Secondary | ICD-10-CM | POA: Diagnosis not present

## 2016-11-17 DIAGNOSIS — M5430 Sciatica, unspecified side: Secondary | ICD-10-CM | POA: Diagnosis not present

## 2016-11-17 DIAGNOSIS — M9904 Segmental and somatic dysfunction of sacral region: Secondary | ICD-10-CM | POA: Diagnosis not present

## 2016-11-18 ENCOUNTER — Telehealth: Payer: Self-pay | Admitting: Physician Assistant

## 2016-11-18 NOTE — Telephone Encounter (Signed)
ROI faxed to [facility]

## 2016-11-19 DIAGNOSIS — G603 Idiopathic progressive neuropathy: Secondary | ICD-10-CM | POA: Diagnosis not present

## 2016-11-19 DIAGNOSIS — G5603 Carpal tunnel syndrome, bilateral upper limbs: Secondary | ICD-10-CM | POA: Diagnosis not present

## 2016-11-19 DIAGNOSIS — M5412 Radiculopathy, cervical region: Secondary | ICD-10-CM | POA: Diagnosis not present

## 2016-11-19 DIAGNOSIS — M5481 Occipital neuralgia: Secondary | ICD-10-CM | POA: Diagnosis not present

## 2016-11-19 DIAGNOSIS — M5417 Radiculopathy, lumbosacral region: Secondary | ICD-10-CM | POA: Diagnosis not present

## 2016-11-24 ENCOUNTER — Encounter: Payer: Self-pay | Admitting: Family Medicine

## 2016-11-24 ENCOUNTER — Ambulatory Visit (INDEPENDENT_AMBULATORY_CARE_PROVIDER_SITE_OTHER): Payer: Medicare Other | Admitting: Family Medicine

## 2016-11-24 VITALS — BP 100/70 | HR 70 | Ht 71.5 in | Wt 178.2 lb

## 2016-11-24 DIAGNOSIS — E785 Hyperlipidemia, unspecified: Secondary | ICD-10-CM | POA: Diagnosis not present

## 2016-11-24 DIAGNOSIS — Z114 Encounter for screening for human immunodeficiency virus [HIV]: Secondary | ICD-10-CM

## 2016-11-24 DIAGNOSIS — Z1159 Encounter for screening for other viral diseases: Secondary | ICD-10-CM | POA: Diagnosis not present

## 2016-11-24 LAB — CBC WITH DIFFERENTIAL/PLATELET
BASOS PCT: 0.2 % (ref 0.0–3.0)
Basophils Absolute: 0 10*3/uL (ref 0.0–0.1)
EOS PCT: 0.5 % (ref 0.0–5.0)
Eosinophils Absolute: 0 10*3/uL (ref 0.0–0.7)
HEMATOCRIT: 44.5 % (ref 39.0–52.0)
HEMOGLOBIN: 14.8 g/dL (ref 13.0–17.0)
LYMPHS PCT: 11.6 % — AB (ref 12.0–46.0)
Lymphs Abs: 1 10*3/uL (ref 0.7–4.0)
MCHC: 33.2 g/dL (ref 30.0–36.0)
MCV: 89.2 fl (ref 78.0–100.0)
MONO ABS: 0.8 10*3/uL (ref 0.1–1.0)
Monocytes Relative: 9.9 % (ref 3.0–12.0)
Neutro Abs: 6.4 10*3/uL (ref 1.4–7.7)
Neutrophils Relative %: 77.8 % — ABNORMAL HIGH (ref 43.0–77.0)
Platelets: 211 10*3/uL (ref 150.0–400.0)
RBC: 4.99 Mil/uL (ref 4.22–5.81)
RDW: 13.9 % (ref 11.5–15.5)
WBC: 8.3 10*3/uL (ref 4.0–10.5)

## 2016-11-24 LAB — LIPID PANEL
CHOLESTEROL: 180 mg/dL (ref 0–200)
HDL: 49.3 mg/dL (ref >39.00)
LDL Cholesterol: 115 mg/dL — ABNORMAL HIGH (ref 0–99)
NonHDL: 130.43
TRIGLYCERIDES: 78 mg/dL (ref 0.0–149.0)
Total CHOL/HDL Ratio: 4
VLDL: 15.6 mg/dL (ref 0.0–40.0)

## 2016-11-24 LAB — COMPREHENSIVE METABOLIC PANEL
ALBUMIN: 3.8 g/dL (ref 3.5–5.2)
ALK PHOS: 93 U/L (ref 39–117)
ALT: 11 U/L (ref 0–53)
AST: 11 U/L (ref 0–37)
BUN: 20 mg/dL (ref 6–23)
CALCIUM: 8.5 mg/dL (ref 8.4–10.5)
CO2: 27 mEq/L (ref 19–32)
Chloride: 107 mEq/L (ref 96–112)
Creatinine, Ser: 0.82 mg/dL (ref 0.40–1.50)
GFR: 100.01 mL/min (ref >60.00)
Glucose, Bld: 93 mg/dL (ref 70–99)
POTASSIUM: 3.7 meq/L (ref 3.5–5.1)
SODIUM: 141 meq/L (ref 135–145)
TOTAL PROTEIN: 5.7 g/dL — AB (ref 6.0–8.3)
Total Bilirubin: 0.6 mg/dL (ref 0.2–1.2)

## 2016-11-24 MED ORDER — SIMVASTATIN 40 MG PO TABS: 40.0000 mg | ORAL_TABLET | Freq: Every day | ORAL | 3 refills | Status: DC

## 2016-11-24 NOTE — Patient Instructions (Signed)
We will start simvastatin today.  Take 1 pill a day.  Let us know if it causes aches.  We will check blood work today.  Come back in 3-6 months for a recheck.  Take care,  Dr [name]

## 2016-11-24 NOTE — Progress Notes (Signed)
    Subjective:  David Armstrong is a 66 y.o. male who presents today with a chief complaint of hyperlipidemia.   HPI:  Hyperlipidemia, Chronic Patient has not been on a statin in a few years due to myalgias. He has tried high potency statins lipitor and crestor in the past. Currently walks some. Is interested in going cycling again. Tries to eat a healthy diet.   ROS- No chest pain or shortness of breath. No myalgias  Objective:  Physical Exam: BP 100/70   Pulse 70   Ht 5' 11.5" (1.816 m)   Wt 178 lb 3.2 oz (80.8 kg)   SpO2 98%   BMI 24.51 kg/m   Gen: NAD, resting comfortably CV: RRR with no murmurs appreciated Pulm: NWOB, CTAB with no crackles, wheezes, or rhonchi GI: Normal bowel sounds present. Soft, Nontender, Nondistended. MSK: no edema, cyanosis, or clubbing noted Neuro: grossly normal, moves all extremities Psych: Normal affect and thought content  Assessment/Plan:  Hyperlipidemia Elevated on panel 2 years ago. Will start simvastatin today - hopefully this will be better tolerated. Check lipid panel today. If continues to have myalfias, consider every other day dozing vs starting fibrate. Follow up in 3-6 months for recheck lipids.  [name]. [name], MD 11/24/2016 10:52 AM

## 2016-11-24 NOTE — Assessment & Plan Note (Signed)
Elevated on panel 2 years ago. Will start simvastatin today - hopefully this will be better tolerated. Check lipid panel today. If continues to have myalfias, consider every other day dozing vs starting fibrate. Follow up in 3-6 months for recheck lipids.

## 2016-11-25 ENCOUNTER — Other Ambulatory Visit: Payer: Self-pay

## 2016-11-25 DIAGNOSIS — E785 Hyperlipidemia, unspecified: Secondary | ICD-10-CM

## 2016-11-25 LAB — HEPATITIS C ANTIBODY: HCV AB: NONREACTIVE

## 2016-11-25 LAB — HIV ANTIBODY (ROUTINE TESTING W REFLEX): HIV: NONREACTIVE

## 2016-11-28 ENCOUNTER — Telehealth: Payer: Self-pay | Admitting: Family Medicine

## 2016-11-28 NOTE — Telephone Encounter (Signed)
Patient called in reference to waking up  "soaking wet" and "clammy" this morning. Patient stated this has happened before but not this bad. Patient would like to know if he needs to be seen or what he should do. Please call patient and advise. OK to leave message.

## 2016-12-02 NOTE — Telephone Encounter (Signed)
Tried calling patient yesterday with no answer.

## 2016-12-03 NOTE — Telephone Encounter (Signed)
Spoke with pt. He reported at he will call us back to make an appointment if symptoms are recurrent. No other concerns at this time.

## 2016-12-04 DIAGNOSIS — R8279 Other abnormal findings on microbiological examination of urine: Secondary | ICD-10-CM | POA: Diagnosis not present

## 2016-12-04 DIAGNOSIS — R8271 Bacteriuria: Secondary | ICD-10-CM | POA: Diagnosis not present

## 2016-12-04 DIAGNOSIS — R350 Frequency of micturition: Secondary | ICD-10-CM | POA: Diagnosis not present

## 2016-12-18 ENCOUNTER — Ambulatory Visit (INDEPENDENT_AMBULATORY_CARE_PROVIDER_SITE_OTHER): Payer: Medicare Other | Admitting: Family Medicine

## 2016-12-18 ENCOUNTER — Encounter: Payer: Self-pay | Admitting: Family Medicine

## 2016-12-18 VITALS — BP 100/70 | HR 76 | Temp 97.6°F | Ht 71.5 in | Wt 171.4 lb

## 2016-12-18 DIAGNOSIS — R399 Unspecified symptoms and signs involving the genitourinary system: Secondary | ICD-10-CM | POA: Diagnosis not present

## 2016-12-18 DIAGNOSIS — M533 Sacrococcygeal disorders, not elsewhere classified: Secondary | ICD-10-CM | POA: Diagnosis not present

## 2016-12-18 DIAGNOSIS — R350 Frequency of micturition: Secondary | ICD-10-CM | POA: Diagnosis not present

## 2016-12-18 LAB — POC URINALSYSI DIPSTICK (AUTOMATED)
Bilirubin, UA: NEGATIVE
Blood, UA: NEGATIVE
Glucose, UA: NEGATIVE
Ketones, UA: NEGATIVE
Leukocytes, UA: NEGATIVE
Nitrite, UA: NEGATIVE
Protein, UA: NEGATIVE
SPEC GRAV UA: 1.015 (ref 1.010–1.025)
Urobilinogen, UA: 0.2 E.U./dL
pH, UA: 7 (ref 5.0–8.0)

## 2016-12-18 NOTE — Assessment & Plan Note (Signed)
AUA symptome score of 15 today. UA negative. Will send for culture. Offered flomax, however patient deferred. Will follow up with urology in 3 weeks.

## 2016-12-18 NOTE — Progress Notes (Signed)
    Subjective:  David Armstrong is a 66 y.o. male who presents today with a chief complaint of urinary frequency.   HPI:  Urinary Frequency, New Problem Recently went to the urologist and was told that he had a UTI. Was started on antibiotic. He completed this antibiotic but the frequency did not significantly improve. Getting better. No fevers or chills. He has some dribbling and difficulty starting/stopping urination.  IPSS Questionnaire (AUA-7): Over the past month.   1)  How often have you had a sensation of not emptying your bladder completely after you finish urinating?  4 - More than half the time  2)  How often have you had to urinate again less than two hours after you finished urinating? 2 - Less than half the time  3)  How often have you found you stopped and started again several times when you urinated?  3 - About half the time  4) How difficult have you found it to postpone urination?  3 - About half the time  5) How often have you had a weak urinary stream?  1 - Less than 1 time in 5  6) How often have you had to push or strain to begin urination?  1 - Less than 1 time in 5  7) How many times did you most typically get up to urinate from the time you went to bed until the time you got up in the morning?  1 - 1 time  Total score:  0-7 mildly symptomatic   8-19 moderately symptomatic   20-35 severely symptomatic  Total: 15   Coccyx Pain, new problem Patient also with a few days of coccyx pain. Thinks it is due to sitting in his computer chair. No treatments tried. No fevers or chills. No incontinence. No weakness or numbnes.   ROS: Per HPI  PMH: Smoking history reviewed. Never smoker.   Objective:  Physical Exam: BP 100/70   Pulse 76   Temp 97.6 F (36.4 C) (Oral)   Ht 5' 11.5" (1.816 m)   Wt 171 lb 6.4 oz (77.7 kg)   SpO2 98%   BMI 23.57 kg/m   Gen: NAD, resting comfortably Skin: warm, dry MSK: Tender to palpation over coccyx. LE with normal strength and  sensation.  Neuro: grossly normal, moves all extremities Psych: Normal affect and thought content  Results for orders placed or performed in visit on 12/18/16 (from the past 72 hour(s))  POCT Urinalysis Dipstick (Automated)     Status: None   Collection Time: 12/18/16  9:05 AM  Result Value Ref Range   Color, UA Yellow    Clarity, UA Clear    Glucose, UA Negative    Bilirubin, UA Negative    Ketones, UA Negative    Spec Grav, UA 1.015 1.010 - 1.025   Blood, UA Negative    pH, UA 7.0 5.0 - 8.0   Protein, UA Negative    Urobilinogen, UA 0.2 0.2 or 1.0 E.U./dL   Nitrite, UA Negative    Leukocytes, UA Negative Negative   Assessment/Plan:  Lower urinary tract symptoms (LUTS) AUA symptome score of 15 today. UA negative. Will send for culture. Offered flomax, however patient deferred. Will follow up with urology in 3 weeks.   Coccydynia No red flag signs or symptoms. Offered supportive care: NSAIDs as needed and discussed obtaining a donut pillow while sitting. Return precautions reviewed. Follow up as needed.   [name]. [name], MD 12/18/2016 9:48 AM

## 2016-12-21 LAB — URINE CULTURE
MICRO NUMBER:: 80980007
SPECIMEN QUALITY:: ADEQUATE

## 2016-12-23 ENCOUNTER — Other Ambulatory Visit: Payer: Self-pay | Admitting: Family Medicine

## 2016-12-23 MED ORDER — NITROFURANTOIN MONOHYD MACRO 100 MG PO CAPS: 100.0000 mg | ORAL_CAPSULE | Freq: Two times a day (BID) | ORAL | 0 refills | Status: AC

## 2016-12-24 DIAGNOSIS — M5481 Occipital neuralgia: Secondary | ICD-10-CM | POA: Diagnosis not present

## 2016-12-24 DIAGNOSIS — M5441 Lumbago with sciatica, right side: Secondary | ICD-10-CM | POA: Diagnosis not present

## 2016-12-24 DIAGNOSIS — M5442 Lumbago with sciatica, left side: Secondary | ICD-10-CM | POA: Diagnosis not present

## 2016-12-24 DIAGNOSIS — R200 Anesthesia of skin: Secondary | ICD-10-CM | POA: Diagnosis not present

## 2016-12-29 ENCOUNTER — Telehealth: Payer: Self-pay | Admitting: Family Medicine

## 2016-12-29 NOTE — Telephone Encounter (Signed)
MEDICATION: hyoscyamine (ANASPAZ) 0.125 MG TBDP disintergrating tablet  PHARMACY:   [id number], [place] [phone] (Phone) [phone] (Fax)     IS THIS A 90 DAY SUPPLY : y  IS PATIENT OUT OF MEDICATION: y  IF NOT; HOW MUCH IS LEFT:   LAST APPOINTMENT DATE: @9 /09/2016  NEXT APPOINTMENT DATE:@11 /29/2018  OTHER COMMENTS:    **Let patient know to contact pharmacy at the end of the day to make sure medication is ready. **  ** Please notify patient to allow 48-72 hours to process**  **Encourage patient to contact the pharmacy for refills or they can request refills through [facility]**

## 2016-12-30 MED ORDER — HYOSCYAMINE SULFATE 0.125 MG PO TBDP: 0.1250 mg | ORAL_TABLET | Freq: Four times a day (QID) | ORAL | 3 refills | Status: DC | PRN

## 2016-12-30 NOTE — Telephone Encounter (Signed)
Rx filled.  [name]. [name], MD 12/30/2016 8:34 AM

## 2017-01-06 DIAGNOSIS — N401 Enlarged prostate with lower urinary tract symptoms: Secondary | ICD-10-CM | POA: Diagnosis not present

## 2017-01-06 DIAGNOSIS — R3121 Asymptomatic microscopic hematuria: Secondary | ICD-10-CM | POA: Diagnosis not present

## 2017-01-06 DIAGNOSIS — R351 Nocturia: Secondary | ICD-10-CM | POA: Diagnosis not present

## 2017-01-14 DIAGNOSIS — L570 Actinic keratosis: Secondary | ICD-10-CM | POA: Diagnosis not present

## 2017-01-14 DIAGNOSIS — D485 Neoplasm of uncertain behavior of skin: Secondary | ICD-10-CM | POA: Diagnosis not present

## 2017-01-26 DIAGNOSIS — N200 Calculus of kidney: Secondary | ICD-10-CM | POA: Diagnosis not present

## 2017-01-26 DIAGNOSIS — R3121 Asymptomatic microscopic hematuria: Secondary | ICD-10-CM | POA: Diagnosis not present

## 2017-02-10 DIAGNOSIS — R3121 Asymptomatic microscopic hematuria: Secondary | ICD-10-CM | POA: Diagnosis not present

## 2017-02-10 DIAGNOSIS — N202 Calculus of kidney with calculus of ureter: Secondary | ICD-10-CM | POA: Diagnosis not present

## 2017-02-16 DIAGNOSIS — M5431 Sciatica, right side: Secondary | ICD-10-CM | POA: Diagnosis not present

## 2017-02-16 DIAGNOSIS — M9903 Segmental and somatic dysfunction of lumbar region: Secondary | ICD-10-CM | POA: Diagnosis not present

## 2017-02-16 DIAGNOSIS — M9904 Segmental and somatic dysfunction of sacral region: Secondary | ICD-10-CM | POA: Diagnosis not present

## 2017-02-16 DIAGNOSIS — M9905 Segmental and somatic dysfunction of pelvic region: Secondary | ICD-10-CM | POA: Diagnosis not present

## 2017-02-19 ENCOUNTER — Other Ambulatory Visit: Payer: Self-pay | Admitting: Urology

## 2017-02-19 DIAGNOSIS — M9903 Segmental and somatic dysfunction of lumbar region: Secondary | ICD-10-CM | POA: Diagnosis not present

## 2017-02-19 DIAGNOSIS — M5431 Sciatica, right side: Secondary | ICD-10-CM | POA: Diagnosis not present

## 2017-02-19 DIAGNOSIS — M9904 Segmental and somatic dysfunction of sacral region: Secondary | ICD-10-CM | POA: Diagnosis not present

## 2017-02-19 DIAGNOSIS — M9905 Segmental and somatic dysfunction of pelvic region: Secondary | ICD-10-CM | POA: Diagnosis not present

## 2017-02-20 DIAGNOSIS — H0015 Chalazion left lower eyelid: Secondary | ICD-10-CM | POA: Diagnosis not present

## 2017-02-20 DIAGNOSIS — D485 Neoplasm of uncertain behavior of skin: Secondary | ICD-10-CM | POA: Diagnosis not present

## 2017-02-20 DIAGNOSIS — H0012 Chalazion right lower eyelid: Secondary | ICD-10-CM | POA: Diagnosis not present

## 2017-02-20 DIAGNOSIS — H00024 Hordeolum internum left upper eyelid: Secondary | ICD-10-CM | POA: Diagnosis not present

## 2017-02-25 DIAGNOSIS — M9905 Segmental and somatic dysfunction of pelvic region: Secondary | ICD-10-CM | POA: Diagnosis not present

## 2017-02-25 DIAGNOSIS — M9903 Segmental and somatic dysfunction of lumbar region: Secondary | ICD-10-CM | POA: Diagnosis not present

## 2017-02-25 DIAGNOSIS — M5431 Sciatica, right side: Secondary | ICD-10-CM | POA: Diagnosis not present

## 2017-02-25 DIAGNOSIS — M9904 Segmental and somatic dysfunction of sacral region: Secondary | ICD-10-CM | POA: Diagnosis not present

## 2017-02-26 DIAGNOSIS — M9903 Segmental and somatic dysfunction of lumbar region: Secondary | ICD-10-CM | POA: Diagnosis not present

## 2017-02-26 DIAGNOSIS — M9904 Segmental and somatic dysfunction of sacral region: Secondary | ICD-10-CM | POA: Diagnosis not present

## 2017-02-26 DIAGNOSIS — M5431 Sciatica, right side: Secondary | ICD-10-CM | POA: Diagnosis not present

## 2017-02-26 DIAGNOSIS — M9905 Segmental and somatic dysfunction of pelvic region: Secondary | ICD-10-CM | POA: Diagnosis not present

## 2017-03-02 ENCOUNTER — Ambulatory Visit (INDEPENDENT_AMBULATORY_CARE_PROVIDER_SITE_OTHER): Payer: Medicare Other | Admitting: Family Medicine

## 2017-03-02 ENCOUNTER — Encounter: Payer: Self-pay | Admitting: Family Medicine

## 2017-03-02 VITALS — BP 100/70 | HR 80 | Ht 71.5 in | Wt 178.6 lb

## 2017-03-02 DIAGNOSIS — M9904 Segmental and somatic dysfunction of sacral region: Secondary | ICD-10-CM | POA: Diagnosis not present

## 2017-03-02 DIAGNOSIS — M9905 Segmental and somatic dysfunction of pelvic region: Secondary | ICD-10-CM | POA: Diagnosis not present

## 2017-03-02 DIAGNOSIS — M25473 Effusion, unspecified ankle: Secondary | ICD-10-CM | POA: Diagnosis not present

## 2017-03-02 DIAGNOSIS — M25511 Pain in right shoulder: Secondary | ICD-10-CM | POA: Diagnosis not present

## 2017-03-02 DIAGNOSIS — R600 Localized edema: Secondary | ICD-10-CM

## 2017-03-02 DIAGNOSIS — M9903 Segmental and somatic dysfunction of lumbar region: Secondary | ICD-10-CM | POA: Diagnosis not present

## 2017-03-02 DIAGNOSIS — M5431 Sciatica, right side: Secondary | ICD-10-CM | POA: Diagnosis not present

## 2017-03-02 MED ORDER — PREDNISONE 50 MG PO TABS: 50.0000 mg | ORAL_TABLET | Freq: Every day | ORAL | 0 refills | Status: AC

## 2017-03-02 MED ORDER — HYOSCYAMINE SULFATE 0.125 MG PO TBDP: 0.1250 mg | ORAL_TABLET | Freq: Four times a day (QID) | ORAL | 3 refills | Status: DC | PRN

## 2017-03-02 NOTE — Progress Notes (Signed)
    Subjective:  David Armstrong is a 66 y.o. male who presents today with a chief complaint of bilateral ankle swelling.   HPI:  Bilateral ankle swelling, acute issue Symptoms started about 2 weeks ago.  Have worsened over that time.  Feels like his right lower extremity is a little larger than his left lower extremities.  No chest pain.  No shortness of breath.  No orthopnea.  No increased urination.  No obvious precipitating events.  No weight gain.  He has a history of DVT in his right leg and is concerned that it may be coming back.  No recent prolonged immobility.  Right shoulder pain, acute issue Started about a week ago.  No obvious precipitating events.  Stable over the last week.  Pain is described as sharp and located in the anterior aspect of the shoulder.  Sometimes will radiate down his entire arm.  No weakness or numbness.  He does have some mild neck pain.  ROS: Per HPI  PMH: Smoking history reviewed.  Never smoker.  Objective:  Physical Exam: BP 100/70   Pulse 80   Ht 5' 11.5" (1.816 m)   Wt 178 lb 9.6 oz (81 kg)   SpO2 98%   BMI 24.56 kg/m   Gen: NAD, resting comfortably CV: RRR with no murmurs appreciated Pulm: NWOB, CTAB with no crackles, wheezes, or rhonchi MSK:  -Neck: No deformities.  Spurling negative bilaterally.  Mild tenderness to palpation along right paraspinal muscles. -Right shoulder: No deformities.  Full range of motion.  Strength 5 out of 5 with supraspinatus, internal rotation, and external rotation testing.  Tinel sign negative at ulnar tunnel.  Tinel sign negative at wrist.  Phalen's negative.  Grip strength 5 out of 5.  Crossover test negative.  Neer and Hawking test negative. -Lower extremities: 2+ pitting edema to midshin bilaterally.  Varicosities noted bilaterally.  Mid calf circumference approximately 24 cm bilaterally.  Assessment/Plan:  Lower extremity edema, acute issue Likely secondary to venous insufficiency.  He does have some  varicosities and a prior history of DVT, however this is less likely given his bilateral distribution.  Will check d-dimer to rule out.  He had an echocardiogram last year with normal ejection fraction and grade 1 diastolic function.  He does not have any other signs or symptoms of heart failure.  We will check CBC, CMET, and TSH to rule out other causes of lower extremity edema.  Advised patient to continue elevating his feet.  Advised low-salt diet.  If above workup negative and symptoms fail to improve with conservative measures, would consider starting him on diuretic if symptoms continue to be problematic.  Return precautions reviewed.  Right shoulder pain, acute issue Likely secondary to mild radiculopathy, and paraspinal muscle inflammation.  His rotator cuff testing is intact.  His neurovascular exam is normal distally.  Reassured patient.  GERD.  Will start prednisone burst.  Return precautions reviewed.  Consider referral to sports medicine not improving.  [name]. [name], MD 03/03/2017 10:49 AM

## 2017-03-03 ENCOUNTER — Encounter: Payer: Self-pay | Admitting: Family Medicine

## 2017-03-03 LAB — D-DIMER, QUANTITATIVE (NOT AT ARMC): D DIMER QUANT: 0.39 ug{FEU}/mL (ref <0.50)

## 2017-03-03 LAB — COMPREHENSIVE METABOLIC PANEL
ALK PHOS: 88 U/L (ref 39–117)
ALT: 19 U/L (ref 0–53)
AST: 25 U/L (ref 0–37)
Albumin: 3.8 g/dL (ref 3.5–5.2)
BILIRUBIN TOTAL: 0.6 mg/dL (ref 0.2–1.2)
BUN: 13 mg/dL (ref 6–23)
CALCIUM: 9 mg/dL (ref 8.4–10.5)
CO2: 28 mEq/L (ref 19–32)
Chloride: 107 mEq/L (ref 96–112)
Creatinine, Ser: 0.78 mg/dL (ref 0.40–1.50)
GFR: 105.86 mL/min (ref >60.00)
Glucose, Bld: 130 mg/dL — ABNORMAL HIGH (ref 70–99)
Potassium: 3.9 mEq/L (ref 3.5–5.1)
Sodium: 141 mEq/L (ref 135–145)
TOTAL PROTEIN: 6.3 g/dL (ref 6.0–8.3)

## 2017-03-03 LAB — CBC WITH DIFFERENTIAL/PLATELET
BASOS ABS: 0.1 10*3/uL (ref 0.0–0.1)
Basophils Relative: 1.1 % (ref 0.0–3.0)
EOS PCT: 6 % — AB (ref 0.0–5.0)
Eosinophils Absolute: 0.3 10*3/uL (ref 0.0–0.7)
HEMATOCRIT: 41.8 % (ref 39.0–52.0)
Hemoglobin: 13.9 g/dL (ref 13.0–17.0)
LYMPHS PCT: 27.3 % (ref 12.0–46.0)
Lymphs Abs: 1.3 10*3/uL (ref 0.7–4.0)
MCHC: 33.3 g/dL (ref 30.0–36.0)
MCV: 90.6 fl (ref 78.0–100.0)
MONOS PCT: 11.1 % (ref 3.0–12.0)
Monocytes Absolute: 0.5 10*3/uL (ref 0.1–1.0)
NEUTROS ABS: 2.6 10*3/uL (ref 1.4–7.7)
Neutrophils Relative %: 54.5 % (ref 43.0–77.0)
PLATELETS: 183 10*3/uL (ref 150.0–400.0)
RBC: 4.61 Mil/uL (ref 4.22–5.81)
RDW: 14.9 % (ref 11.5–15.5)
WBC: 4.8 10*3/uL (ref 4.0–10.5)

## 2017-03-03 LAB — TSH: TSH: 2.94 u[IU]/mL (ref 0.35–4.50)

## 2017-03-03 NOTE — Progress Notes (Signed)
Labs all normal. Test negative for blood clot. He should continue keeping his feet elevated and avoiding salt.  [name]. [name], MD 03/03/2017 1:25 PM

## 2017-03-09 ENCOUNTER — Ambulatory Visit: Payer: Medicare Other | Admitting: *Deleted

## 2017-03-09 ENCOUNTER — Other Ambulatory Visit: Payer: Medicare Other

## 2017-03-09 DIAGNOSIS — H0012 Chalazion right lower eyelid: Secondary | ICD-10-CM | POA: Diagnosis not present

## 2017-03-09 DIAGNOSIS — H0015 Chalazion left lower eyelid: Secondary | ICD-10-CM | POA: Diagnosis not present

## 2017-03-09 DIAGNOSIS — H0014 Chalazion left upper eyelid: Secondary | ICD-10-CM | POA: Diagnosis not present

## 2017-03-09 NOTE — Progress Notes (Deleted)
Subjective:   David Armstrong is a 66 y.o. male who presents for Medicare Annual/Subsequent preventive examination.  Review of Systems:  No ROS.  Medicare Wellness Visit. Additional risk factors are reflected in the social history.        Objective:    Vitals: There were no vitals taken for this visit.  There is no height or weight on file to calculate BMI.  Tobacco Social History   Tobacco Use  Smoking Status Never Smoker  Smokeless Tobacco Never Used     Counseling given: Not Answered   Past Medical History:  Diagnosis Date  . Anal fissure   . Arthritis    "lower back; hips;' right knee" (02/11/2016)  . Bilateral pulmonary embolism ([place]) 02/11/2016  . Bipolar disorder ([place])   . Chronic constipation   . DEPRESSION   . DIVERTICULOSIS, COLON [phone]   Colonoscopy  . Fatty liver   . GERD   . H. pylori infection   . Hiatal hernia [id number]   EGD  . IBS (irritable bowel syndrome)   . Peripheral neuropathy   . Personal history of colonic polyps 10/27/2007   HYPERPLASTIC POLYP  . Pneumonia 12/2014  . TMJ (temporomandibular joint syndrome)   . Unspecified gastritis and gastroduodenitis without mention of hemorrhage 2009   EGD   Past Surgical History:  Procedure Laterality Date  . BELPHAROPTOSIS REPAIR Bilateral 01/2015   "droopy eyelids"  . CYSTOSCOPY/RETROGRADE/URETEROSCOPY/STONE EXTRACTION WITH BASKET  1987; 1992; 1999; 2010  . EXCISIONAL HEMORRHOIDECTOMY    . FOOT NEUROMA SURGERY Left 09/2015  . FOOT NEUROMA SURGERY Right 07/2015  . FOOT NEUROMA SURGERY Right 2004   "between big and 2nd toes; shortened my 2nd toe"  . FRACTURE SURGERY    . HAMMER TOE SURGERY Left    "little toe"  . INCISION AND DRAINAGE FOOT Left 1992   "ball of foot; stepped on piece of hard wire & it went thru my shoe"  . KNEE ARTHROSCOPY Bilateral   . LITHOTRIPSY  1992  . NASAL FRACTURE SURGERY  1997  . PARTIAL KNEE ARTHROPLASTY Right 06/2003  . PLANTAR FASCIA RELEASE  Bilateral 1987-1993   right-left  . RHINOPLASTY  2000  . SHOULDER ARTHROSCOPY Left 03/2011; 2013  . SHOULDER ARTHROSCOPY Right 2007   "frozen shoulder"  . TOENAIL EXCISION Bilateral 2012   "big toes"   Family History  Problem Relation Age of Onset  . Heart disease Mother   . Stroke Mother   . Cirrhosis Father   . Heart disease Father   . Skin cancer Father   . Prostate cancer Maternal Grandfather   . Parkinson's disease Paternal Grandfather   . Heart disease Brother   . Colon cancer Neg Hx   . Stomach cancer Neg Hx    Social History   Substance and Sexual Activity  Sexual Activity No    Outpatient Encounter Medications as of 03/09/2017  Medication Sig  . aspirin EC 81 MG tablet Take 81 mg by mouth daily.  Marland Kitchen desonide (DESOWEN) 0.05 % lotion Apply 1 application topically daily as needed (for skin).  . fluticasone (CUTIVATE) 0.05 % cream apply to affected area twice a day if needed  . fluticasone (FLONASE) 50 MCG/ACT nasal spray Place 2 sprays into both nostrils daily as needed for allergies or rhinitis.  . hyoscyamine (ANASPAZ) 0.125 MG TBDP disintergrating tablet Place 1 tablet (0.125 mg total) every 6 (six) hours as needed under the tongue.  Marland Kitchen LORazepam (ATIVAN) 1 MG tablet Take 1 mg  by mouth every 8 (eight) hours as needed for anxiety.   Marland Kitchen omeprazole (PRILOSEC) 20 MG capsule take 1 capsule by mouth once daily  . PROCTO-MED HC 2.5 % rectal cream apply rectally as directed at bedtime for 7 days  . sildenafil (VIAGRA) 50 MG tablet   . simvastatin (ZOCOR) 40 MG tablet Take 1 tablet (40 mg total) by mouth at bedtime.   No facility-administered encounter medications on file as of 03/09/2017.     Activities of Daily Living No flowsheet data found.  Patient Care Team: Vivi Barrack, MD as PCP - General (Family Medicine) Michael Boston, MD as Consulting Physician (General Surgery) Sable Feil, MD as Consulting Physician (Gastroenterology) Willia Craze, NP as  Nurse Practitioner (Gastroenterology)   Assessment:    Physical assessment deferred to PCP.  Exercise Activities and Dietary recommendations    Goals    None     Fall Risk Fall Risk  11/11/2016  Falls in the past year? No   Depression Screen PHQ 2/9 Scores 11/11/2016  PHQ - 2 Score 1    Cognitive Function        Immunization History  Administered Date(s) Administered  . Influenza Split 12/22/2012  . Influenza,inj,Quad PF,6+ Mos 01/07/2014  . Influenza-Unspecified 02/12/2015, 01/13/2016  . Tdap 11/29/2012  . Zoster 11/29/2012  . Zoster Recombinat (Shingrix) 07/13/2016, 09/12/2016   Screening Tests Health Maintenance  Topic Date Due  . PNA vac Low Risk Adult (1 of 2 - PCV13) 03/20/2016  . TETANUS/TDAP  11/30/2022  . COLONOSCOPY  03/03/2023  . INFLUENZA VACCINE  Completed  . Hepatitis C Screening  Completed  . HIV Screening  Completed      Plan:   Follow up with PCP as directed.  I have personally reviewed and noted the following in the patient's chart:   . Medical and social history . Use of alcohol, tobacco or illicit drugs  . Current medications and supplements . Functional ability and status . Nutritional status . Physical activity . Advanced directives . List of other physicians . Vitals . Screenings to include cognitive, depression, and falls . Referrals and appointments  In addition, I have reviewed and discussed with patient certain preventive protocols, quality metrics, and best practice recommendations. A written personalized care plan for preventive services as well as general preventive health recommendations were provided to patient.     Williemae Area, RN  03/09/2017

## 2017-03-09 NOTE — Progress Notes (Deleted)
Pre visit review using our clinic review tool, if applicable. No additional management support is needed unless otherwise documented below in the visit note. 

## 2017-03-09 NOTE — Progress Notes (Deleted)
PCP notes:   Health maintenance: PCV13:  Abnormal screenings:    Patient concerns:    Nurse concerns:   Next PCP appt: 03/12/17 10:00

## 2017-03-10 ENCOUNTER — Encounter (HOSPITAL_BASED_OUTPATIENT_CLINIC_OR_DEPARTMENT_OTHER): Payer: Self-pay | Admitting: *Deleted

## 2017-03-10 ENCOUNTER — Other Ambulatory Visit: Payer: Self-pay

## 2017-03-10 NOTE — Progress Notes (Signed)
SPOKE W/ PT VIA PHONE FOR PRE-OP INTERVIEW.  NPO AFTER MN.  ARRIVE AT 0530.  NEEDS KUB. CURRENT LAB RESULTS IN CHART AND Epic.  WILL TAKE PRILOSEC AM DOS W/ SIPS OF WATER.

## 2017-03-12 ENCOUNTER — Encounter: Payer: Self-pay | Admitting: Family Medicine

## 2017-03-12 ENCOUNTER — Ambulatory Visit (INDEPENDENT_AMBULATORY_CARE_PROVIDER_SITE_OTHER): Payer: Medicare Other | Admitting: Family Medicine

## 2017-03-12 VITALS — BP 110/70 | HR 77 | Temp 97.4°F | Ht 71.5 in | Wt 175.4 lb

## 2017-03-12 DIAGNOSIS — M25511 Pain in right shoulder: Secondary | ICD-10-CM

## 2017-03-12 DIAGNOSIS — D692 Other nonthrombocytopenic purpura: Secondary | ICD-10-CM | POA: Diagnosis not present

## 2017-03-12 DIAGNOSIS — K219 Gastro-esophageal reflux disease without esophagitis: Secondary | ICD-10-CM | POA: Diagnosis not present

## 2017-03-12 DIAGNOSIS — R600 Localized edema: Secondary | ICD-10-CM | POA: Diagnosis not present

## 2017-03-12 NOTE — Assessment & Plan Note (Signed)
Discussed long-term risks associated with chronic PPI use.  Patient elected to continue with this at this point.  In the future would consider switching to H2 blocker such as ranitidine

## 2017-03-12 NOTE — Assessment & Plan Note (Signed)
Likely due to daily aspirin use.  Reassured patient.

## 2017-03-12 NOTE — Progress Notes (Signed)
    Subjective:  NAITHEN RIVENBURG is a 66 y.o. male who presents today with a chief complaint of follow-up lower extremity swelling.   HPI:  Lower Extremity Swelling, established problem, improving Patient seen 10 days ago for this.  Had workup at that time including CBC, CMET, d-dimer, TSH all of which were within normal ranges.  Patient was instructed to avoid salt, elevate his legs, and use compression stockings.  Symptoms have improved moderately since her last visit.  However he has not started compression stockings yet.  No chest pain or shortness of breath.  Right shoulder pain, established problem, improving Patient also seen 10 days ago for this.  He was given a steroid burst which helped with his symptoms.  He will be going back to his orthopedist soon for further evaluation.  GERD, established problem, stable Stable on Prilosec 20 mg daily.  Senile purpura, new problem Chronic history and lower extremities.  Patient was previously told that these were iron deposits by previous physician.  ROS: Per HPI  PMH: Smoking history reviewed.  Never smoker.  Objective:  Physical Exam: BP 110/70 (BP Location: Left Arm, Patient Position: Sitting, Cuff Size: Normal)   Pulse 77   Temp (!) 97.4 F (36.3 C) (Oral)   Ht 5' 11.5" (1.816 m)   Wt 175 lb 6.1 oz (79.6 kg)   SpO2 96%   BMI 24.12 kg/m   Gen: NAD, resting comfortably MSK: Lower extremities with 1+ pitting edema to knees bilaterally.  Small purpura noted on lower extremities bilaterally.  Calves nontender to palpation.  Assessment/Plan:  Lower extremity edema Reassured patient.  Advised him to start compression stockings.  Also discussed other lifestyle interventions including frequent elevation and avoidance of high salt diet.  Shoulder pain Improved with prednisone burst.  He will be following up with his orthopedist for further management.  He does have a history of degenerative disc disease in his neck which is likely  contributing.  May benefit from epidural steroid injection.  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  Reassured patient.  GERD Discussed long-term risks associated with chronic PPI use.  Patient elected to continue with this at this point.  In the future would consider switching to H2 blocker such as ranitidine  Preventive healthcare Advised patient to return soon for his annual wellness visit.  [name]. [name], MD 03/12/2017 12:55 PM

## 2017-03-15 NOTE — H&P (Signed)
HPI: David Armstrong is a 66 year-old male who presents for ureteroscopy for a left ureteral stone.  He did not see the blood in his urine. He has been told that they had blood in the urine in the past.   He does not have a history of smoking. He does have a history of exposure to chemicals or fumes. He does have a history of urinary infections. He does not have a burning sensation when he urinates. There is not a history of GU malignancy in the family. He is having pain. He has not recently had unwanted weight loss.   This condition would be considered of mild to moderate severity with no modifying factors or associated signs or symptoms other than as noted above.   01/06/17: He came in today and indicated that since I had seen him last he saw his primary care physician and was found to have another infection was placed on antibiotics. He is not having any dysuria but continues to have nocturia 2-3 times. He says he has a good force of stream at night but in the daytime his stream seems slower. He has no hesitancy. He has never smoked but he has worked around chemicals in the past.   02/10/17: He has returned today having undergone a contrasted CT scan to evaluate the upper tract. Cystoscopy in 8/18 revealed no intravesical lesions. He reports he has been having some intermittent pain in the right flank region. It is not severe.     ALLERGIES: Contrast Media Ready-Box MISC - Skin Rash Soma Compound TABS - Skin Rash Sulfa Drugs - Other Reaction, unknown    MEDICATIONS: Aspir 81 81 mg tablet, delayed release 1 tablet PO Daily  Ativan 1 MG Oral Tablet Oral  Fish Oil 1 PO Daily  Prednisone 50 mg tablet 1 tablet PO As Directed Take 1 pill 13, 7 and 1 hours prior to your schedule procedure.     GU PSH: Cystoscopy - 11/14/2016, 03/25/2016 Cystoscopy Ureteroscopy - 2010, 2010, 2010 ESWL - 2010 Locm 300-399Mg /Ml Iodine,1Ml - 01/26/2017      PSH Notes: oral surgery 08/2016- compound bone graft      NON-GU PSH: Cataract surgery, Bilateral Foot surgery (unspecified), Bilateral Knee Arthroscopy/surgery Revise Knee Joint - 2013 Shoulder Surgery (Unspecified), X2    GU PMH: Microscopic hematuria (Stable), Although he has had a renal ultrasound and does have known stones he has had recurrent UTIs and has persistent microscopic hematuria and therefore I have recommended evaluating his upper tract with a CT scan as a renal ultrasound is not able to detect urothelial lesions. He underwent cystoscopy in 8/18 with no significant abnormality noted. - 01/06/2017, (Stable), I found no abnormality on cystoscopy. It would appear his microscopic hematuria is from a benign source and with known left renal calculi that's going to be the most likely cause., - 11/14/2016 Nocturia (Stable), When he returns we will discuss the use of tamsulosin for his voiding symptoms. - 01/06/2017 Urinary Frequency, My suspicion is that his acute onset of frequency may be due to UTI so I will culture his urine and treat according to sensitivities. - 12/04/2016 Microscopic hematuria (Worsening, Chronic), Chronic intermittent microscopic hematuria. Will have pt RTC for possible cysto w/Dr. Karsten Ro. With not overt hydronephrosis noted and last CT 10/17 will hold off on CT at this time. With several large left renal calculi noted this could be contributing to microscopic hematuria and may need elective therapy of stones. - 11/12/2016 Disorder of male genital organs,  unspecified (Improving), Interval resolution of a glans penis lesion most consistent with an inflammatory/infectious etiology. I have reassured him and he will return to see me as needed. - 09/04/2016, He has developed a reddened area on the glans penis. We discussed the possible etiologies including mycotic infection versus superficial squamous cell carcinoma. I recommendation is a trial of topical medication and reassessment with biopsy if it does not improve., - 08/05/2016 Gross  hematuria (Stable, Chronic), He has not seen any further gross hematuria with his decreased dosage of Xarelto. I told him if he sees any further hematuria or develops any new pain he should contact me otherwise I will plan to see him back on a yearly basis for KUB as well as PSA and DRE. - 03/25/2016, (Acute), No signs of UTI. Significant microscopic hematuria noted. Will have pt RTC for possible cysto w/Dr. Karsten Ro. Recommend he f/u w/PCP about possible need to stop/change Xarelto. , - 03/13/2016 Lower abdominal pain, unspecified (Acute), It appears that his groin pain is most likely musculoskeletal as he had no abnormality of the testicle on exam with no hernia and no abnormality noted on CT. - 01/17/2016 Renal calculus (Stable), Bilateral, Stones in his left kidney remain unchanged. He is not passing the stone currently. - 01/17/2016, Kidney stone on left side, - 09/05/2015 LLQ pain, Abdominal pain, LLQ (left lower quadrant) - 09/05/2015 BPH w/LUTS, Benign prostatic hyperplasia with urinary obstruction - 2017 ED due to arterial insufficiency, Erectile dysfunction due to arterial insufficiency - 2017 Encounter for Prostate Cancer screening, Prostate cancer screening - 2017 Unil Inguinal Hernia W/O obst or gang,non-recurrent, Inguinal hernia, right - 2014      PMH Notes: Microscopic hematuria: In 8/18 he was found to have microscopic hematuria again. A renal ultrasound was performed which revealed multiple calcifications within the left kidney consistent with stones and microscopic hematuria. No hydronephrosis was present on the ultrasound. A KUB revealed several left renal stones almost certainly resulting in his microscopic hematuria. Cystoscopy was negative for any intravesical lesions.   NON-GU PMH: Bacteriuria, He did have bacteriuria today. - 12/04/2016 Pyuria/other UA findings, I noted pyuria associated with his bacteriuria. Urine culture will be performed to rule out a UTI as the source of his  recent change in voiding pattern. - 12/04/2016 Encounter for general adult medical examination without abnormal findings, Encounter for preventive health examination - 2015 Anxiety, Anxiety (Symptom) - 2014 Personal history of other diseases of the digestive system, History of esophageal reflux - 2014 Personal history of other mental and behavioral disorders, History of depression - 2014 Acute embolism and thrombosis of unspecified deep veins of right lower extremity Pulmonary Embolism, History    FAMILY HISTORY: Death In The Family Father - Father Death In The Family Mother - Mother Heart Disease - Runs In Family nephrolithiasis - Runs In Family No pertinent family history - Other Stroke Syndrome - Runs In Family   SOCIAL HISTORY: Marital Status: Single Preferred Language: English; Ethnicity: Not Hispanic Or Latino; Race: White Current Smoking Status: Patient has never smoked.  Has never drank.  Drinks 1 caffeinated drink per day.    REVIEW OF SYSTEMS:    GU Review Male:   Patient denies frequent urination, hard to postpone urination, burning/ pain with urination, get up at night to urinate, leakage of urine, stream starts and stops, trouble starting your stream, have to strain to urinate , erection problems, and penile pain.  Gastrointestinal (Upper):   Patient denies nausea, vomiting, and indigestion/ heartburn.  Gastrointestinal (  Lower):   Patient denies diarrhea and constipation.  Constitutional:   Patient denies fever, night sweats, weight loss, and fatigue.  Skin:   Patient denies skin rash/ lesion and itching.  Eyes:   Patient denies blurred vision and double vision.  Ears/ Nose/ Throat:   Patient denies sore throat and sinus problems.  Hematologic/Lymphatic:   Patient denies easy bruising and swollen glands.  Cardiovascular:   Patient denies leg swelling and chest pains.  Respiratory:   Patient denies cough and shortness of breath.  Endocrine:   Patient denies excessive  thirst.  Musculoskeletal:   Patient denies back pain and joint pain.  Neurological:   Patient denies headaches and dizziness.  Psychologic:   Patient denies depression and anxiety.   VITAL SIGNS:    Weight 168 lb / 76.2 kg  Height 71 in / 180.34 cm  BP 100/69 mmHg  Pulse 76 /min  Temperature 97.4 F / 36.3 C  BMI 23.4 kg/m   MULTI-SYSTEM PHYSICAL EXAMINATION:    Constitutional: Well-nourished. No physical deformities. Normally developed. Good grooming.   Respiratory: No labored breathing, no use of accessory muscles.   Cardiovascular: Normal temperature, normal extremity pulses, no swelling, no varicosities.   Skin: No paleness, no jaundice, no cyanosis. No lesion, no ulcer, no rash.   Neurologic / Psychiatric: Oriented to time, oriented to place, oriented to person. No depression, no anxiety, no agitation.   Gastrointestinal: No mass, no tenderness, no rigidity, non obese abdomen. No CVAT  Urethral Meatus: Normal size. No lesion, no wart, no discharge, no polyp. Normal location.  Penis: Circumcised, no warts, no cracks. No dorsal Peyronie's plaques, no left corporal Peyronie's plaques, no right corporal Peyronie's plaques, no scarring, no warts. No balanitis, no meatal stenosis.  Rectal: Rectal exam demonstrates normal sphincter tone, the anus is normal on inspection. and no tenderness. Estimated prostate size is 3+. Normal rectal tone, no rectal masses, prostate is smooth, symmetric and non-tender. The perineum is normal on inspection, no perineal tenderness  PAST DATA REVIEWED:  Source Of History:  Patient  Records Review:   Previous Patient Records, POC Tool  X-Ray Review: C.T. Hematuria: Reviewed Films. Reviewed Report. Discussed With Patient. EXAM: CT ABDOMEN AND PELVIS WITHOUT AND WITH CONTRAST TECHNIQUE: Multidetector CT imaging of the abdomen and pelvis was performed following the standard protocol before and following the bolus administration of intravenous contrast. CONTRAST:  125 cc of Isovue-300 COMPARISON: 01/17/2016 FINDINGS: Lower chest: No acute abnormality. Hepatobiliary: No focal liver abnormality is seen. No gallstones, gallbladder wall thickening, or biliary dilatation. Pancreas: Unremarkable. No pancreatic ductal dilatation or surrounding inflammatory changes. Spleen: Normal in size without focal abnormality. Adrenals/Urinary Tract: The adrenal glands are normal. Punctate stone within the inferior pole of the right kidney measures 1-2 mm. Multiple left renal calculi. The largest stone is in the left mid kidney measuring 8 mm, image 41 of series 2. Within the proximal left ureter there is a stone measuring 1 x 0.7 cm, image 67 of series 3. No significant hydronephrosis identified. Urinary bladder is unremarkable. Stomach/Bowel: Small hiatal hernia. Stomach is otherwise within normal limits. Appendix appears normal. No evidence of bowel wall thickening, distention, or inflammatory changes. Vascular/Lymphatic: Aortic atherosclerosis. No aneurysm. No adenopathy identified within the abdomen. No pelvic or inguinal adenopathy. Reproductive: Prostate is unremarkable. Other: No abdominal wall hernia or abnormality. No abdominopelvic ascites. Musculoskeletal: Degenerative disc disease identified within the lumbar spine. No aggressive lytic or sclerotic bone lesions. IMPRESSION: 1. Bilateral nephrolithiasis. 2. Left proximal ureteral stone  measures 1 x 0.7 cm. 3. Small hiatal hernia 4. Aortic Atherosclerosis    07/05/15 03/31/14 03/31/13 01/27/12  PSA  Total PSA 0.95  0.70  0.58  0.71     PROCEDURES: None   ASSESSMENT/PLAN:      ICD-10 Details  1 GU:   Microscopic hematuria - R31.21 Stable - It appears his hematuria is secondary to the presence of a left ureteral stone.  2   Renal calculus - N20.0 Bilateral, Stable - His bilateral renal calculi were again noted and are nonobstructing  3   Ureteral calculus - N20.1 Left, He has a 1 cm stone adjacent to the L4 transverse  process on the left with Hounsfield units of 1200. We discussed the management of urinary stones. These options include observation, ureteroscopy, shockwave lithotripsy, and PCNL. We discussed which options are relevant to these particular stones. We discussed the natural history of stones as well as the complications of untreated stones and the impact on quality of life without treatment as well as with each of the above listed treatments. We also discussed the efficacy of each treatment in its ability to clear the stone burden. With any of these management options I discussed the signs and symptoms of infection and the need for emergent treatment should these be experienced. For each option we discussed the ability of each procedure to clear the patient of their stone burden.   For observation I described the risks which include but are not limited to silent renal damage, life-threatening infection, need for emergent surgery, failure to pass stone, and pain.   For ureteroscopy I described the risks which include heart attack, stroke, pulmonary embolus, death, bleeding, infection, damage to contiguous structures, positioning injury, ureteral stricture, ureteral avulsion, ureteral injury, need for ureteral stent, inability to perform ureteroscopy, need for an interval procedure, inability to clear stone burden, stent discomfort and pain.    He has undergone previous ureteroscopic stone management and has elected to proceed with that form of therapy at this time.

## 2017-03-16 ENCOUNTER — Encounter (HOSPITAL_BASED_OUTPATIENT_CLINIC_OR_DEPARTMENT_OTHER): Payer: Self-pay

## 2017-03-16 ENCOUNTER — Ambulatory Visit (HOSPITAL_COMMUNITY): Payer: Medicare Other

## 2017-03-16 ENCOUNTER — Ambulatory Visit (HOSPITAL_BASED_OUTPATIENT_CLINIC_OR_DEPARTMENT_OTHER): Payer: Medicare Other | Admitting: Anesthesiology

## 2017-03-16 ENCOUNTER — Ambulatory Visit (HOSPITAL_BASED_OUTPATIENT_CLINIC_OR_DEPARTMENT_OTHER)
Admission: RE | Admit: 2017-03-16 | Discharge: 2017-03-16 | Disposition: A | Discharge status: Home / Self Care | Payer: Medicare Other | Source: Ambulatory Visit | Attending: Urology | Admitting: Urology

## 2017-03-16 ENCOUNTER — Encounter (HOSPITAL_BASED_OUTPATIENT_CLINIC_OR_DEPARTMENT_OTHER)
Admission: RE | Disposition: A | Discharge status: Home / Self Care | Payer: Self-pay | Source: Ambulatory Visit | Attending: Urology

## 2017-03-16 DIAGNOSIS — A419 Sepsis, unspecified organism: Secondary | ICD-10-CM | POA: Diagnosis not present

## 2017-03-16 DIAGNOSIS — Z7982 Long term (current) use of aspirin: Secondary | ICD-10-CM | POA: Insufficient documentation

## 2017-03-16 DIAGNOSIS — Z86711 Personal history of pulmonary embolism: Secondary | ICD-10-CM

## 2017-03-16 DIAGNOSIS — D649 Anemia, unspecified: Secondary | ICD-10-CM | POA: Diagnosis not present

## 2017-03-16 DIAGNOSIS — R109 Unspecified abdominal pain: Secondary | ICD-10-CM | POA: Diagnosis not present

## 2017-03-16 DIAGNOSIS — Z79899 Other long term (current) drug therapy: Secondary | ICD-10-CM

## 2017-03-16 DIAGNOSIS — E871 Hypo-osmolality and hyponatremia: Secondary | ICD-10-CM | POA: Diagnosis not present

## 2017-03-16 DIAGNOSIS — I951 Orthostatic hypotension: Secondary | ICD-10-CM | POA: Diagnosis not present

## 2017-03-16 DIAGNOSIS — N390 Urinary tract infection, site not specified: Secondary | ICD-10-CM | POA: Diagnosis not present

## 2017-03-16 DIAGNOSIS — N200 Calculus of kidney: Secondary | ICD-10-CM | POA: Diagnosis not present

## 2017-03-16 DIAGNOSIS — Z77098 Contact with and (suspected) exposure to other hazardous, chiefly nonmedicinal, chemicals: Secondary | ICD-10-CM

## 2017-03-16 DIAGNOSIS — F319 Bipolar disorder, unspecified: Secondary | ICD-10-CM | POA: Diagnosis not present

## 2017-03-16 DIAGNOSIS — Z86718 Personal history of other venous thrombosis and embolism: Secondary | ICD-10-CM | POA: Diagnosis not present

## 2017-03-16 DIAGNOSIS — J189 Pneumonia, unspecified organism: Secondary | ICD-10-CM | POA: Diagnosis not present

## 2017-03-16 DIAGNOSIS — E785 Hyperlipidemia, unspecified: Secondary | ICD-10-CM | POA: Diagnosis not present

## 2017-03-16 DIAGNOSIS — N132 Hydronephrosis with renal and ureteral calculous obstruction: Secondary | ICD-10-CM | POA: Diagnosis not present

## 2017-03-16 DIAGNOSIS — K219 Gastro-esophageal reflux disease without esophagitis: Secondary | ICD-10-CM | POA: Insufficient documentation

## 2017-03-16 DIAGNOSIS — N202 Calculus of kidney with calculus of ureter: Secondary | ICD-10-CM

## 2017-03-16 DIAGNOSIS — N201 Calculus of ureter: Secondary | ICD-10-CM

## 2017-03-16 DIAGNOSIS — R9431 Abnormal electrocardiogram [ECG] [EKG]: Secondary | ICD-10-CM | POA: Diagnosis not present

## 2017-03-16 DIAGNOSIS — N136 Pyonephrosis: Secondary | ICD-10-CM | POA: Diagnosis not present

## 2017-03-16 HISTORY — DX: Diverticulosis of large intestine without perforation or abscess without bleeding: K57.30

## 2017-03-16 HISTORY — DX: Personal history of pneumonia (recurrent): Z87.01

## 2017-03-16 HISTORY — DX: Personal history of pulmonary embolism: Z86.711

## 2017-03-16 HISTORY — DX: Anxiety disorder, unspecified: F41.9

## 2017-03-16 HISTORY — PX: CYSTOSCOPY/URETEROSCOPY/HOLMIUM LASER/STENT PLACEMENT: SHX6546

## 2017-03-16 HISTORY — DX: Personal history of other infectious and parasitic diseases: Z86.19

## 2017-03-16 HISTORY — DX: Solitary pulmonary nodule: R91.1

## 2017-03-16 HISTORY — DX: Calculus of ureter: N20.1

## 2017-03-16 HISTORY — DX: Asymptomatic varicose veins of unspecified lower extremity: I83.90

## 2017-03-16 HISTORY — DX: Hyperlipidemia, unspecified: E78.5

## 2017-03-16 HISTORY — DX: Personal history of other diseases of the digestive system: Z87.19

## 2017-03-16 HISTORY — DX: Localized edema: R60.0

## 2017-03-16 HISTORY — DX: Gastro-esophageal reflux disease without esophagitis: K21.9

## 2017-03-16 HISTORY — DX: Depression, unspecified: F32.A

## 2017-03-16 HISTORY — DX: Major depressive disorder, single episode, unspecified: F32.9

## 2017-03-16 HISTORY — DX: Nocturia: R35.1

## 2017-03-16 HISTORY — DX: Personal history of other venous thrombosis and embolism: Z86.718

## 2017-03-16 SURGERY — CYSTOSCOPY/URETEROSCOPY/HOLMIUM LASER/STENT PLACEMENT
Anesthesia: General | Laterality: Left

## 2017-03-16 MED ORDER — EPHEDRINE SULFATE 50 MG/ML IJ SOLN
INTRAMUSCULAR | Status: DC | PRN
  Administered 2017-03-16: 10 mg via INTRAVENOUS

## 2017-03-16 MED ORDER — HYDROCODONE-ACETAMINOPHEN 10-325 MG PO TABS: 1.0000 | ORAL_TABLET | ORAL | 0 refills | Status: DC | PRN

## 2017-03-16 MED ORDER — DEXAMETHASONE SODIUM PHOSPHATE 4 MG/ML IJ SOLN
INTRAMUSCULAR | Status: DC | PRN
  Administered 2017-03-16: 10 mg via INTRAVENOUS

## 2017-03-16 MED ORDER — MENTHOL 3 MG MT LOZG
LOZENGE | OROMUCOSAL | Status: AC
  Filled 2017-03-16: qty 9

## 2017-03-16 MED ORDER — HYDROCODONE-ACETAMINOPHEN 10-325 MG PO TABS
1.0000 | ORAL_TABLET | Freq: Four times a day (QID) | ORAL | Status: DC | PRN
  Administered 2017-03-16: 1 via ORAL
  Filled 2017-03-16: qty 1

## 2017-03-16 MED ORDER — LACTATED RINGERS IV SOLN
INTRAVENOUS | Status: DC
  Administered 2017-03-16: 07:00:00 via INTRAVENOUS
  Filled 2017-03-16: qty 1000

## 2017-03-16 MED ORDER — PROPOFOL 10 MG/ML IV BOLUS
INTRAVENOUS | Status: DC | PRN
  Administered 2017-03-16: 180 mg via INTRAVENOUS

## 2017-03-16 MED ORDER — EPHEDRINE 5 MG/ML INJ
INTRAVENOUS | Status: AC
  Filled 2017-03-16: qty 10

## 2017-03-16 MED ORDER — OXYCODONE HCL 5 MG/5ML PO SOLN
5.0000 mg | Freq: Once | ORAL | Status: DC | PRN
  Filled 2017-03-16: qty 5

## 2017-03-16 MED ORDER — KETOROLAC TROMETHAMINE 30 MG/ML IJ SOLN
30.0000 mg | Freq: Once | INTRAMUSCULAR | Status: DC | PRN
Start: 2017-03-16 — End: 2017-03-16
  Filled 2017-03-16: qty 1

## 2017-03-16 MED ORDER — OXYCODONE HCL 5 MG PO TABS
5.0000 mg | ORAL_TABLET | Freq: Once | ORAL | Status: DC | PRN
  Filled 2017-03-16: qty 1

## 2017-03-16 MED ORDER — DEXAMETHASONE SODIUM PHOSPHATE 10 MG/ML IJ SOLN
INTRAMUSCULAR | Status: AC
  Filled 2017-03-16: qty 1

## 2017-03-16 MED ORDER — ONDANSETRON HCL 4 MG/2ML IJ SOLN
INTRAMUSCULAR | Status: AC
  Filled 2017-03-16: qty 2

## 2017-03-16 MED ORDER — HYDROCODONE-ACETAMINOPHEN 10-325 MG PO TABS
ORAL_TABLET | ORAL | Status: AC
  Filled 2017-03-16: qty 1

## 2017-03-16 MED ORDER — PROMETHAZINE HCL 25 MG/ML IJ SOLN
6.2500 mg | INTRAMUSCULAR | Status: DC | PRN
  Filled 2017-03-16: qty 1

## 2017-03-16 MED ORDER — MIDAZOLAM HCL 2 MG/2ML IJ SOLN
INTRAMUSCULAR | Status: AC
  Filled 2017-03-16: qty 2

## 2017-03-16 MED ORDER — FENTANYL CITRATE (PF) 100 MCG/2ML IJ SOLN
INTRAMUSCULAR | Status: AC
  Filled 2017-03-16: qty 2

## 2017-03-16 MED ORDER — FENTANYL CITRATE (PF) 100 MCG/2ML IJ SOLN
25.0000 ug | INTRAMUSCULAR | Status: DC | PRN
Start: 2017-03-16 — End: 2017-03-16
  Administered 2017-03-16 (×4): 25 ug via INTRAVENOUS
  Filled 2017-03-16: qty 1

## 2017-03-16 MED ORDER — PHENAZOPYRIDINE HCL 200 MG PO TABS: 200.0000 mg | ORAL_TABLET | Freq: Three times a day (TID) | ORAL | 0 refills | Status: DC | PRN

## 2017-03-16 MED ORDER — CIPROFLOXACIN IN D5W 400 MG/200ML IV SOLN
400.0000 mg | Freq: Once | INTRAVENOUS | Status: AC
  Administered 2017-03-16: 400 mg via INTRAVENOUS
  Filled 2017-03-16: qty 200

## 2017-03-16 MED ORDER — CIPROFLOXACIN IN D5W 400 MG/200ML IV SOLN
INTRAVENOUS | Status: AC
  Filled 2017-03-16: qty 200

## 2017-03-16 MED ORDER — IOHEXOL 300 MG/ML  SOLN
INTRAMUSCULAR | Status: DC | PRN
  Administered 2017-03-16: 10 mL via URETHRAL

## 2017-03-16 MED ORDER — ONDANSETRON HCL 4 MG/2ML IJ SOLN
INTRAMUSCULAR | Status: DC | PRN
  Administered 2017-03-16: 4 mg via INTRAVENOUS

## 2017-03-16 MED ORDER — PROPOFOL 10 MG/ML IV BOLUS
INTRAVENOUS | Status: AC
  Filled 2017-03-16: qty 40

## 2017-03-16 MED ORDER — SODIUM CHLORIDE 0.9 % IR SOLN
Status: DC | PRN
  Administered 2017-03-16: 1000 mL
  Administered 2017-03-16 (×2): 3000 mL via INTRAVESICAL

## 2017-03-16 MED ORDER — FENTANYL CITRATE (PF) 100 MCG/2ML IJ SOLN
INTRAMUSCULAR | Status: DC | PRN
  Administered 2017-03-16 (×4): 25 ug via INTRAVENOUS
  Administered 2017-03-16: 50 ug via INTRAVENOUS
  Administered 2017-03-16 (×2): 25 ug via INTRAVENOUS

## 2017-03-16 MED ORDER — LIDOCAINE 2% (20 MG/ML) 5 ML SYRINGE
INTRAMUSCULAR | Status: AC
  Filled 2017-03-16: qty 5

## 2017-03-16 MED ORDER — LIDOCAINE 2% (20 MG/ML) 5 ML SYRINGE
INTRAMUSCULAR | Status: DC | PRN
  Administered 2017-03-16: 80 mg via INTRAVENOUS

## 2017-03-16 SURGICAL SUPPLY — 27 items
BAG DRAIN URO-CYSTO SKYTR STRL (DRAIN) ×2 IMPLANT
BAG DRN UROCATH (DRAIN) ×1
BASKET ZERO TIP NITINOL 2.4FR (BASKET) IMPLANT
BSKT STON RTRVL ZERO TP 2.4FR (BASKET)
CATH INTERMIT  6FR 70CM (CATHETERS) IMPLANT
CATH URET 5FR 28IN CONE TIP (BALLOONS)
CATH URET 5FR 70CM CONE TIP (BALLOONS) IMPLANT
CLOTH BEACON ORANGE TIMEOUT ST (SAFETY) ×2 IMPLANT
EXTRACTOR STONE 1.7FRX115CM (UROLOGICAL SUPPLIES) ×4 IMPLANT
FIBER LASER FLEXIVA 200 (UROLOGICAL SUPPLIES) ×1 IMPLANT
FIBER LASER FLEXIVA 365 (UROLOGICAL SUPPLIES) IMPLANT
FIBER LASER TRAC TIP (UROLOGICAL SUPPLIES) IMPLANT
GLOVE BIO SURGEON STRL SZ8 (GLOVE) ×2 IMPLANT
GOWN STRL REUS W/TWL XL LVL3 (GOWN DISPOSABLE) ×2 IMPLANT
GUIDEWIRE 0.038 PTFE COATED (WIRE) IMPLANT
GUIDEWIRE ANG ZIPWIRE 038X150 (WIRE) IMPLANT
GUIDEWIRE STR DUAL SENSOR (WIRE) ×2 IMPLANT
INFUSOR MANOMETER BAG 3000ML (MISCELLANEOUS) ×2 IMPLANT
IV NS IRRIG 3000ML ARTHROMATIC (IV SOLUTION) ×4 IMPLANT
KIT RM TURNOVER CYSTO AR (KITS) ×2 IMPLANT
MANIFOLD NEPTUNE II (INSTRUMENTS) IMPLANT
NS IRRIG 500ML POUR BTL (IV SOLUTION) ×2 IMPLANT
PACK CYSTO (CUSTOM PROCEDURE TRAY) ×2 IMPLANT
SHEATH URETERAL 12FRX28CM (UROLOGICAL SUPPLIES) ×1 IMPLANT
STENT URET 6FRX26 CONTOUR (STENTS) ×1 IMPLANT
TUBE CONNECTING 12X1/4 (SUCTIONS) IMPLANT
WATER STERILE IRR 3000ML UROMA (IV SOLUTION) IMPLANT

## 2017-03-16 NOTE — Discharge Instructions (Signed)
Post stone removal/stent placement surgery instructions ° ° °Definitions: ° °Ureter: The duct that transports urine from the kidney to the bladder. °Stent: A plastic hollow tube that is placed into the ureter, from the kidney to the bladder to prevent the ureter from swelling shut. ° °General instructions: ° °Despite the fact that no skin incisions were used, the area around the ureter and bladder is raw and irritated. The stent is a foreign body which will further irritate the bladder wall. This irritation is manifested by increased frequency of urination, both day and night, and by an increase in the urge to urinate. In some, the urge to urinate is present almost always. Sometimes the urge is strong enough that you may not be able to stop your self from urinating. The only real cure is to remove the stent and then give time for the bladder wall to heal which can't be done until the danger of the ureter swelling shut has passed. (This varies from 2-21 days). ° °You may see some blood in your urine while the stent is in place and a few days afterward. Do not be alarmed, even if the urine is clear for a while. Get off your feet and drink lots of fluids until clearing occurs. If you start to pass clots or don't improve, call us. ° °If you have a string coming from your urethra:  The stent string is attached to your ureteral stent.  Do not pull on thisIf you have a string coming from your urethra:  The stent string is attached to your ureteral stent.  Do not pull on this. ° °Diet: ° °You may return to your normal diet immediately. Because of the raw surface of your bladder, alcohol, spicy foods, foods high in acid and drinks with caffeine may cause irritation or frequency and should be used in moderation. To keep your urine flowing freely and avoid constipation, drink plenty of fluids during the day (8-10 glasses). Tip: Avoid cranberry juice because it is very acidic. ° °Activity: ° °Your physical activity doesn't need  to be restricted. However, if you are very active, you may see some blood in the urine. We suggest that you reduce your activity under the circumstances until the bleeding has stopped. ° °Bowels: ° °It is important to keep your bowels regular during the postoperative period. Straining with bowel movements can cause bleeding. A bowel movement every other day is reasonable. Use a mild laxative if needed, such as milk of magnesia 2-3 tablespoons, or 2 Dulcolax tablets. Call if you continue to have problems. If you had been taking narcotics for pain, before, during or after your surgery, you may be constipated. Take a laxative if necessary. ° ° ° ° °Medication: ° °You should resume your pre-surgery medications unless told not to. DO NOT RESUME YOUR ASPIRIN, or any other medicines like ibuprofen, motrin, excedrin, advil, aleve, vitamin E, fish oil as these can all cause bleeding x 7 days. In addition you may be given an antibiotic to prevent or treat infection. Antibiotics are not always necessary. All medication should be taken as prescribed until the bottles are finished unless you are having an unusual reaction to one of the drugs. ° °Problems you should report to us: ° °a. Fever greater than 101°F. °b. Heavy bleeding, or clots (see notes above about blood in urine). °c. Inability to urinate. °d. Drug reactions (hives, rash, nausea, vomiting, diarrhea). °e. Severe burning or pain with urination that is not improving. ° °Followup: ° °  You will need a followup appointment to monitor your progress in most cases. Please call the office for this appointment when you get home if your appointment has not already been scheduled. Usually the first appointment will be about 5-14 days after your surgery and if you have a stent in place it will likely be removed at that time. ° ° °Post Anesthesia Home Care Instructions ° °Activity: °Get plenty of rest for the remainder of the day. A responsible individual must stay with you for 24  hours following the procedure.  °For the next 24 hours, DO NOT: °-Drive a car °-Operate machinery °-Drink alcoholic beverages °-Take any medication unless instructed by your physician °-Make any legal decisions or sign important papers. ° °Meals: °Start with liquid foods such as gelatin or soup. Progress to regular foods as tolerated. Avoid greasy, spicy, heavy foods. If nausea and/or vomiting occur, drink only clear liquids until the nausea and/or vomiting subsides. Call your physician if vomiting continues. ° °Special Instructions/Symptoms: °Your throat may feel dry or sore from the anesthesia or the breathing tube placed in your throat during surgery. If this causes discomfort, gargle with warm salt water. The discomfort should disappear within 24 hours. ° °If you had a scopolamine patch placed behind your ear for the management of post- operative nausea and/or vomiting: ° °1. The medication in the patch is effective for 72 hours, after which it should be removed.  Wrap patch in a tissue and discard in the trash. Wash hands thoroughly with soap and water. °2. You may remove the patch earlier than 72 hours if you experience unpleasant side effects which may include dry mouth, dizziness or visual disturbances. °3. Avoid touching the patch. Wash your hands with soap and water after contact with the patch. ° ° °

## 2017-03-16 NOTE — Anesthesia Preprocedure Evaluation (Signed)
Anesthesia Evaluation  Patient identified by MRN, date of birth, ID band Patient awake    Reviewed: Allergy & Precautions, NPO status , Patient's Chart, lab work & pertinent test results  Airway Mallampati: II  TM Distance: >3 FB Neck ROM: Full    Dental no notable dental hx.    Pulmonary  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/11/2016 multiple bilateral     Pulmonary exam normal breath sounds clear to auscultation       Cardiovascular negative cardio ROS Normal cardiovascular exam Rhythm:Regular Rate:Normal     Neuro/Psych Bipolar Disorder negative neurological ROS     GI/Hepatic Neg liver ROS, GERD  ,  Endo/Other  negative endocrine ROS  Renal/GU negative Renal ROS  negative genitourinary   Musculoskeletal negative musculoskeletal ROS (+)   Abdominal   Peds negative pediatric ROS (+)  Hematology negative hematology ROS (+)   Anesthesia Other Findings   Reproductive/Obstetrics negative OB ROS                             Anesthesia Physical Anesthesia Plan  ASA: II  Anesthesia Plan: General   Post-op Pain Management:    Induction: Intravenous  PONV Risk Score and Plan: 2 and Ondansetron, Dexamethasone and Treatment may vary due to age or medical condition  Airway Management Planned: LMA  Additional Equipment:   Intra-op Plan:   Post-operative Plan: Extubation in OR  Informed Consent: I have reviewed the patients History and Physical, chart, labs and discussed the procedure including the risks, benefits and alternatives for the proposed anesthesia with the patient or authorized representative who has indicated his/her understanding and acceptance.   Dental advisory given  Plan Discussed with: CRNA and Surgeon  Anesthesia Plan Comments:         Anesthesia Quick Evaluation

## 2017-03-16 NOTE — Anesthesia Procedure Notes (Signed)
Procedure Name: LMA Insertion Date/Time: 03/16/2017 7:38 AM Performed by: Justice Rocher, CRNA Pre-anesthesia Checklist: Patient identified, Emergency Drugs available, Suction available and Patient being monitored Patient Re-evaluated:Patient Re-evaluated prior to induction Oxygen Delivery Method: Circle system utilized Preoxygenation: Pre-oxygenation with 100% oxygen Induction Type: IV induction Ventilation: Mask ventilation without difficulty LMA: LMA inserted LMA Size: 4.0 Number of attempts: 1 Airway Equipment and Method: Bite block Placement Confirmation: positive ETCO2 and breath sounds checked- equal and bilateral Tube secured with: Tape Dental Injury: Teeth and Oropharynx as per pre-operative assessment

## 2017-03-16 NOTE — Op Note (Signed)
PATIENT:  David Armstrong  PRE-OPERATIVE DIAGNOSIS: 1. left Ureteral calculus 2.  Left renal calculi  POST-OPERATIVE DIAGNOSIS: Same  PROCEDURE:  1. Cystoscopy with left retrograde pyelogram including interpretation. 2.  Left ureteroscopy and laser lithotripsy with stone extraction. 3.  Left renal stone extraction. 4.  Left double-J stent placement. 5.  Fluoroscopy time less than 1 hour  SURGEON: Claybon Jabs, MD  INDICATION: Mr. Keithly is a 66 year old male with a known left proximal ureteral stone that is large and has not progressed.  He also has known left renal calculi that are asymptomatic.  He is brought to the operating room today for management of his left proximal ureteral stone and renal calculi if all proceeds smoothly with treatment of his ureteral stone.  ANESTHESIA:  General  EBL:  Minimal  DRAINS: 6 French, 26 cm double-J stent in the left ureter (with string)  SPECIMEN: A 5 not stone given to patient  DESCRIPTION OF PROCEDURE: The patient was taken to the major OR and placed on the table. General anesthesia was administered and then the patient was moved to the dorsal lithotomy position. The genitalia was sterilely prepped and draped. An official timeout was performed.  Initially the 57 French cystoscope with 30 lens was passed under direct vision into the bladder. The bladder was then fully inspected. It was noted be free of any tumors, stones or inflammatory lesions. Ureteral orifices were of normal configuration and position. A 6 French open-ended ureteral catheter was then passed through the cystoscope into the ureteral orifice in order to perform a left retrograde pyelogram.  A retrograde pyelogram was performed by injecting full-strength contrast up the left ureter under direct fluoroscopic control. It revealed a filling defect in the proximal ureter consistent with the stone seen on the preoperative KUB. The remainder of the ureter was noted to be normal as  was the intrarenal collecting system. I then passed a 0.038 inch floppy-tipped guidewire through the open ended catheter and into the area of the renal pelvis and this was left in place. The inner portion of a ureteral access sheath was then passed over the guidewire to gently dilate the intramural ureter followed by placement of the ureteral access sheath over the guidewire.  I then removed the inner portion of the access sheath and guidewire and affixed the sheath to the drape.  I then proceeded with ureteroscopy.  A 6 French dual-lumen, flexible, digital ureteroscope passed through the access sheath and up the ureter. The stone was identified and I felt it was too large to extract and therefore elected to proceed with laser lithotripsy. The 200  holmium laser fiber was used to fragment the stone. I then used the [place] basket to extract all of the stone fragments and reinspection of the ureter ureteroscopically revealed no further stone fragments and no injury to the ureter.   Since this portion of the operation proceeded smoothly I elected to proceed with removal of his renal calculi as well.  I identified several stones in the peripheral calyces and these were engaged with the [place] basket and extracted.  There was a single large stone noted as well.  It was grasped and drawn down into the ureter where I used the laser to fragment the stone and then extract all of the fragments.  Again no injury to the ureter was identified and a therefore I passed the guidewire back through the ureteroscope and into the area of the renal pelvis under fluoroscopy.  This was  left in place as the ureteroscope and access sheath were removed.  I then backloaded the cystoscope over the guidewire and passed the stent over the guidewire into the area of the renal pelvis. As the guidewire was removed good curl was noted in the renal pelvis. The bladder was drained and the cystoscope was then removed. The patient tolerated the  procedure well no intraoperative complications.  PLAN OF CARE: Discharge to home after PACU  PATIENT DISPOSITION:  PACU - hemodynamically stable.

## 2017-03-16 NOTE — Transfer of Care (Signed)
Immediate Anesthesia Transfer of Care Note  Patient: David Armstrong  Procedure(s) Performed: Procedure(s) (LRB): CYSTOSCOPY/RETROGRADE/URETEROSCOPY/HOLMIUM LASER/STENT PLACEMENT, STONE BASKET EXTRACTION AND STENT PLACEMENT (Left)  Patient Location: PACU  Anesthesia Type: General  Level of Consciousness: awake, sedated, patient cooperative and responds to stimulation  Airway & Oxygen Therapy: Patient Spontanous Breathing and Patient connected to [place] oxygen  Post-op Assessment: Report given to PACU RN, Post -op Vital signs reviewed and stable and Patient moving all extremities  Post vital signs: Reviewed and stable  Complications: No apparent anesthesia complications

## 2017-03-16 NOTE — Anesthesia Postprocedure Evaluation (Signed)
Anesthesia Post Note  Patient: David Armstrong  Procedure(s) Performed: CYSTOSCOPY/RETROGRADE/URETEROSCOPY/HOLMIUM LASER/STENT PLACEMENT, STONE BASKET EXTRACTION AND STENT PLACEMENT (Left )     Patient location during evaluation: PACU Anesthesia Type: General Level of consciousness: awake and alert Pain management: pain level controlled Vital Signs Assessment: post-procedure vital signs reviewed and stable Respiratory status: spontaneous breathing, nonlabored ventilation, respiratory function stable and patient connected to nasal cannula oxygen Cardiovascular status: blood pressure returned to baseline and stable Postop Assessment: no apparent nausea or vomiting Anesthetic complications: no    Last Vitals:  Vitals:   03/16/17 0902 03/16/17 0915  BP:  108/71  Pulse: 76 77  Resp:  14  Temp:    SpO2: 93% 100%    Last Pain:  Vitals:   03/16/17 0545  TempSrc: Oral                 [name] S

## 2017-03-18 ENCOUNTER — Ambulatory Visit: Payer: Self-pay

## 2017-03-18 ENCOUNTER — Emergency Department (HOSPITAL_COMMUNITY): Payer: Medicare Other

## 2017-03-18 ENCOUNTER — Encounter (HOSPITAL_BASED_OUTPATIENT_CLINIC_OR_DEPARTMENT_OTHER): Payer: Self-pay | Admitting: Urology

## 2017-03-18 ENCOUNTER — Inpatient Hospital Stay (HOSPITAL_COMMUNITY)
Admission: EM | Admit: 2017-03-18 | Discharge: 2017-03-24 | DRG: 853 | Disposition: A | Discharge status: Home / Self Care | Payer: Medicare Other | Attending: Internal Medicine | Admitting: Internal Medicine

## 2017-03-18 ENCOUNTER — Other Ambulatory Visit: Payer: Self-pay

## 2017-03-18 DIAGNOSIS — K219 Gastro-esophageal reflux disease without esophagitis: Secondary | ICD-10-CM | POA: Diagnosis present

## 2017-03-18 DIAGNOSIS — A419 Sepsis, unspecified organism: Principal | ICD-10-CM | POA: Diagnosis present

## 2017-03-18 DIAGNOSIS — Z7982 Long term (current) use of aspirin: Secondary | ICD-10-CM

## 2017-03-18 DIAGNOSIS — K760 Fatty (change of) liver, not elsewhere classified: Secondary | ICD-10-CM | POA: Diagnosis present

## 2017-03-18 DIAGNOSIS — Z8042 Family history of malignant neoplasm of prostate: Secondary | ICD-10-CM

## 2017-03-18 DIAGNOSIS — K589 Irritable bowel syndrome without diarrhea: Secondary | ICD-10-CM | POA: Diagnosis present

## 2017-03-18 DIAGNOSIS — N132 Hydronephrosis with renal and ureteral calculous obstruction: Secondary | ICD-10-CM | POA: Diagnosis not present

## 2017-03-18 DIAGNOSIS — Z961 Presence of intraocular lens: Secondary | ICD-10-CM | POA: Diagnosis present

## 2017-03-18 DIAGNOSIS — M1711 Unilateral primary osteoarthritis, right knee: Secondary | ICD-10-CM | POA: Diagnosis present

## 2017-03-18 DIAGNOSIS — N202 Calculus of kidney with calculus of ureter: Secondary | ICD-10-CM | POA: Diagnosis present

## 2017-03-18 DIAGNOSIS — Z86718 Personal history of other venous thrombosis and embolism: Secondary | ICD-10-CM | POA: Diagnosis not present

## 2017-03-18 DIAGNOSIS — F319 Bipolar disorder, unspecified: Secondary | ICD-10-CM | POA: Diagnosis present

## 2017-03-18 DIAGNOSIS — Z808 Family history of malignant neoplasm of other organs or systems: Secondary | ICD-10-CM

## 2017-03-18 DIAGNOSIS — J181 Lobar pneumonia, unspecified organism: Secondary | ICD-10-CM | POA: Diagnosis not present

## 2017-03-18 DIAGNOSIS — E785 Hyperlipidemia, unspecified: Secondary | ICD-10-CM | POA: Diagnosis present

## 2017-03-18 DIAGNOSIS — Z9842 Cataract extraction status, left eye: Secondary | ICD-10-CM

## 2017-03-18 DIAGNOSIS — Z823 Family history of stroke: Secondary | ICD-10-CM

## 2017-03-18 DIAGNOSIS — R404 Transient alteration of awareness: Secondary | ICD-10-CM | POA: Diagnosis not present

## 2017-03-18 DIAGNOSIS — E871 Hypo-osmolality and hyponatremia: Secondary | ICD-10-CM | POA: Diagnosis present

## 2017-03-18 DIAGNOSIS — J189 Pneumonia, unspecified organism: Secondary | ICD-10-CM | POA: Diagnosis present

## 2017-03-18 DIAGNOSIS — Z91041 Radiographic dye allergy status: Secondary | ICD-10-CM

## 2017-03-18 DIAGNOSIS — N390 Urinary tract infection, site not specified: Secondary | ICD-10-CM | POA: Diagnosis not present

## 2017-03-18 DIAGNOSIS — R531 Weakness: Secondary | ICD-10-CM | POA: Diagnosis not present

## 2017-03-18 DIAGNOSIS — Z820 Family history of epilepsy and other diseases of the nervous system: Secondary | ICD-10-CM

## 2017-03-18 DIAGNOSIS — D649 Anemia, unspecified: Secondary | ICD-10-CM | POA: Diagnosis present

## 2017-03-18 DIAGNOSIS — F419 Anxiety disorder, unspecified: Secondary | ICD-10-CM | POA: Diagnosis present

## 2017-03-18 DIAGNOSIS — K449 Diaphragmatic hernia without obstruction or gangrene: Secondary | ICD-10-CM | POA: Diagnosis present

## 2017-03-18 DIAGNOSIS — Z86010 Personal history of colonic polyps: Secondary | ICD-10-CM | POA: Diagnosis not present

## 2017-03-18 DIAGNOSIS — Z882 Allergy status to sulfonamides status: Secondary | ICD-10-CM

## 2017-03-18 DIAGNOSIS — I951 Orthostatic hypotension: Secondary | ICD-10-CM | POA: Diagnosis present

## 2017-03-18 DIAGNOSIS — Z79899 Other long term (current) drug therapy: Secondary | ICD-10-CM

## 2017-03-18 DIAGNOSIS — N136 Pyonephrosis: Secondary | ICD-10-CM | POA: Diagnosis present

## 2017-03-18 DIAGNOSIS — M5136 Other intervertebral disc degeneration, lumbar region: Secondary | ICD-10-CM | POA: Diagnosis present

## 2017-03-18 DIAGNOSIS — Z87442 Personal history of urinary calculi: Secondary | ICD-10-CM

## 2017-03-18 DIAGNOSIS — N529 Male erectile dysfunction, unspecified: Secondary | ICD-10-CM | POA: Diagnosis present

## 2017-03-18 DIAGNOSIS — Z8619 Personal history of other infectious and parasitic diseases: Secondary | ICD-10-CM | POA: Diagnosis not present

## 2017-03-18 DIAGNOSIS — R109 Unspecified abdominal pain: Secondary | ICD-10-CM | POA: Diagnosis not present

## 2017-03-18 DIAGNOSIS — N200 Calculus of kidney: Secondary | ICD-10-CM

## 2017-03-18 DIAGNOSIS — K5909 Other constipation: Secondary | ICD-10-CM | POA: Diagnosis present

## 2017-03-18 DIAGNOSIS — R9431 Abnormal electrocardiogram [ECG] [EKG]: Secondary | ICD-10-CM | POA: Diagnosis not present

## 2017-03-18 DIAGNOSIS — Z8249 Family history of ischemic heart disease and other diseases of the circulatory system: Secondary | ICD-10-CM | POA: Diagnosis not present

## 2017-03-18 DIAGNOSIS — Z9841 Cataract extraction status, right eye: Secondary | ICD-10-CM

## 2017-03-18 DIAGNOSIS — R630 Anorexia: Secondary | ICD-10-CM | POA: Diagnosis present

## 2017-03-18 DIAGNOSIS — M160 Bilateral primary osteoarthritis of hip: Secondary | ICD-10-CM | POA: Diagnosis present

## 2017-03-18 DIAGNOSIS — Z888 Allergy status to other drugs, medicaments and biological substances status: Secondary | ICD-10-CM

## 2017-03-18 DIAGNOSIS — Z79891 Long term (current) use of opiate analgesic: Secondary | ICD-10-CM

## 2017-03-18 DIAGNOSIS — Z86711 Personal history of pulmonary embolism: Secondary | ICD-10-CM | POA: Diagnosis present

## 2017-03-18 DIAGNOSIS — Z8701 Personal history of pneumonia (recurrent): Secondary | ICD-10-CM

## 2017-03-18 LAB — URINALYSIS, ROUTINE W REFLEX MICROSCOPIC
BACTERIA UA: NONE SEEN
BILIRUBIN URINE: NEGATIVE
Glucose, UA: NEGATIVE mg/dL
KETONES UR: 5 mg/dL — AB
Nitrite: POSITIVE — AB
PROTEIN: 100 mg/dL — AB
SQUAMOUS EPITHELIAL / LPF: NONE SEEN
Specific Gravity, Urine: 1.013 (ref 1.005–1.030)
pH: 8 (ref 5.0–8.0)

## 2017-03-18 LAB — COMPREHENSIVE METABOLIC PANEL
ALBUMIN: 3.6 g/dL (ref 3.5–5.0)
ALT: 17 U/L (ref 17–63)
AST: 24 U/L (ref 15–41)
Alkaline Phosphatase: 82 U/L (ref 38–126)
Anion gap: 6 (ref 5–15)
BILIRUBIN TOTAL: 1.1 mg/dL (ref 0.3–1.2)
BUN: 16 mg/dL (ref 6–20)
CHLORIDE: 101 mmol/L (ref 101–111)
CO2: 25 mmol/L (ref 22–32)
Calcium: 8 mg/dL — ABNORMAL LOW (ref 8.9–10.3)
Creatinine, Ser: 1.01 mg/dL (ref 0.61–1.24)
GFR calc Af Amer: >60 mL/min (ref >60)
GFR calc non Af Amer: >60 mL/min (ref >60)
GLUCOSE: 97 mg/dL (ref 65–99)
POTASSIUM: 3.6 mmol/L (ref 3.5–5.1)
SODIUM: 132 mmol/L — AB (ref 135–145)
Total Protein: 6 g/dL — ABNORMAL LOW (ref 6.5–8.1)

## 2017-03-18 LAB — CBC WITH DIFFERENTIAL/PLATELET
Basophils Absolute: 0 10*3/uL (ref 0.0–0.1)
Basophils Relative: 0 %
Eosinophils Absolute: 0 10*3/uL (ref 0.0–0.7)
Eosinophils Relative: 0 %
HEMATOCRIT: 39 % (ref 39.0–52.0)
HEMOGLOBIN: 13.2 g/dL (ref 13.0–17.0)
LYMPHS ABS: 1 10*3/uL (ref 0.7–4.0)
Lymphocytes Relative: 11 %
MCH: 30.4 pg (ref 26.0–34.0)
MCHC: 33.8 g/dL (ref 30.0–36.0)
MCV: 89.9 fL (ref 78.0–100.0)
MONO ABS: 1.1 10*3/uL — AB (ref 0.1–1.0)
MONOS PCT: 11 %
NEUTROS ABS: 7.5 10*3/uL (ref 1.7–7.7)
Neutrophils Relative %: 78 %
Platelets: 159 10*3/uL (ref 150–400)
RBC: 4.34 MIL/uL (ref 4.22–5.81)
RDW: 14.1 % (ref 11.5–15.5)
WBC: 9.7 10*3/uL (ref 4.0–10.5)

## 2017-03-18 LAB — I-STAT CG4 LACTIC ACID, ED
LACTIC ACID, VENOUS: 2.65 mmol/L — AB (ref 0.5–1.9)
Lactic Acid, Venous: 1.17 mmol/L (ref 0.5–1.9)

## 2017-03-18 LAB — D-DIMER, QUANTITATIVE: D-Dimer, Quant: 0.61 ug/mL-FEU — ABNORMAL HIGH (ref 0.00–0.50)

## 2017-03-18 LAB — LACTIC ACID, PLASMA: LACTIC ACID, VENOUS: 1.5 mmol/L (ref 0.5–1.9)

## 2017-03-18 LAB — PROCALCITONIN: Procalcitonin: 0.22 ng/mL

## 2017-03-18 LAB — PROTIME-INR
INR: 1.04
Prothrombin Time: 13.5 seconds (ref 11.4–15.2)

## 2017-03-18 LAB — CBG MONITORING, ED: GLUCOSE-CAPILLARY: 84 mg/dL (ref 65–99)

## 2017-03-18 LAB — TROPONIN I

## 2017-03-18 MED ORDER — FLUTICASONE PROPIONATE 50 MCG/ACT NA SUSP
2.0000 | Freq: Every day | NASAL | Status: DC | PRN
  Filled 2017-03-18: qty 16

## 2017-03-18 MED ORDER — CEFEPIME HCL 2 G IJ SOLR
2.0000 g | Freq: Once | INTRAMUSCULAR | Status: AC
  Administered 2017-03-18: 2 g via INTRAVENOUS
  Filled 2017-03-18: qty 2

## 2017-03-18 MED ORDER — PANTOPRAZOLE SODIUM 40 MG PO TBEC
40.0000 mg | DELAYED_RELEASE_TABLET | Freq: Every day | ORAL | Status: DC
  Administered 2017-03-18 – 2017-03-24 (×7): 40 mg via ORAL
  Filled 2017-03-18 (×7): qty 1

## 2017-03-18 MED ORDER — ENOXAPARIN SODIUM 40 MG/0.4ML ~~LOC~~ SOLN
40.0000 mg | Freq: Every day | SUBCUTANEOUS | Status: DC
  Administered 2017-03-18 – 2017-03-23 (×6): 40 mg via SUBCUTANEOUS
  Filled 2017-03-18 (×6): qty 0.4

## 2017-03-18 MED ORDER — HYOSCYAMINE SULFATE 0.125 MG PO TBDP
0.1250 mg | ORAL_TABLET | Freq: Four times a day (QID) | ORAL | Status: DC | PRN
  Filled 2017-03-18: qty 1

## 2017-03-18 MED ORDER — DEXTROSE 5 % IV SOLN
500.0000 mg | INTRAVENOUS | Status: DC
  Filled 2017-03-18: qty 500

## 2017-03-18 MED ORDER — HYDROCODONE-ACETAMINOPHEN 10-325 MG PO TABS
1.0000 | ORAL_TABLET | ORAL | Status: DC | PRN
  Administered 2017-03-19 – 2017-03-20 (×2): 1 via ORAL
  Administered 2017-03-21 – 2017-03-22 (×2): 2 via ORAL
  Filled 2017-03-18: qty 1
  Filled 2017-03-18 (×2): qty 2
  Filled 2017-03-18: qty 1

## 2017-03-18 MED ORDER — IPRATROPIUM-ALBUTEROL 0.5-2.5 (3) MG/3ML IN SOLN: 3.0000 mL | Freq: Four times a day (QID) | RESPIRATORY_TRACT | Status: DC | PRN

## 2017-03-18 MED ORDER — SODIUM CHLORIDE 0.9 % IV SOLN
1.0000 g | Freq: Once | INTRAVENOUS | Status: AC
  Administered 2017-03-18: 1 g via INTRAVENOUS
  Filled 2017-03-18: qty 10

## 2017-03-18 MED ORDER — ACETAMINOPHEN 325 MG PO TABS
650.0000 mg | ORAL_TABLET | Freq: Four times a day (QID) | ORAL | Status: DC | PRN
  Administered 2017-03-18 – 2017-03-19 (×3): 650 mg via ORAL
  Filled 2017-03-18 (×3): qty 2

## 2017-03-18 MED ORDER — SODIUM CHLORIDE 0.9 % IV BOLUS (SEPSIS)
1000.0000 mL | Freq: Once | INTRAVENOUS | Status: AC
  Administered 2017-03-18: 1000 mL via INTRAVENOUS

## 2017-03-18 MED ORDER — DEXTROSE 5 % IV SOLN: 2.0000 g | Freq: Once | INTRAVENOUS | Status: DC

## 2017-03-18 MED ORDER — ONDANSETRON HCL 4 MG PO TABS: 4.0000 mg | ORAL_TABLET | Freq: Four times a day (QID) | ORAL | Status: DC | PRN

## 2017-03-18 MED ORDER — GUAIFENESIN ER 600 MG PO TB12
600.0000 mg | ORAL_TABLET | Freq: Two times a day (BID) | ORAL | Status: DC
  Administered 2017-03-18 – 2017-03-24 (×12): 600 mg via ORAL
  Filled 2017-03-18 (×12): qty 1

## 2017-03-18 MED ORDER — ASPIRIN EC 81 MG PO TBEC
81.0000 mg | DELAYED_RELEASE_TABLET | Freq: Every day | ORAL | Status: DC
  Administered 2017-03-19 – 2017-03-24 (×7): 81 mg via ORAL
  Filled 2017-03-18 (×7): qty 1

## 2017-03-18 MED ORDER — ACETAMINOPHEN 650 MG RE SUPP: 650.0000 mg | Freq: Four times a day (QID) | RECTAL | Status: DC | PRN

## 2017-03-18 MED ORDER — LORAZEPAM 0.5 MG PO TABS
0.5000 mg | ORAL_TABLET | Freq: Three times a day (TID) | ORAL | Status: DC | PRN
  Administered 2017-03-20 – 2017-03-23 (×5): 1 mg via ORAL
  Filled 2017-03-18 (×6): qty 2

## 2017-03-18 MED ORDER — AZITHROMYCIN 500 MG IV SOLR
500.0000 mg | Freq: Once | INTRAVENOUS | Status: AC
  Administered 2017-03-18: 500 mg via INTRAVENOUS
  Filled 2017-03-18: qty 500

## 2017-03-18 MED ORDER — SODIUM CHLORIDE 0.9 % IV BOLUS (SEPSIS)
500.0000 mL | Freq: Once | INTRAVENOUS | Status: AC
  Administered 2017-03-18: 500 mL via INTRAVENOUS

## 2017-03-18 MED ORDER — SODIUM CHLORIDE 0.9 % IV SOLN
INTRAVENOUS | Status: DC
  Administered 2017-03-18 – 2017-03-21 (×4): via INTRAVENOUS
  Administered 2017-03-22: 100 mL/h via INTRAVENOUS
  Administered 2017-03-23 – 2017-03-24 (×3): via INTRAVENOUS

## 2017-03-18 MED ORDER — ONDANSETRON HCL 4 MG/2ML IJ SOLN: 4.0000 mg | Freq: Four times a day (QID) | INTRAMUSCULAR | Status: DC | PRN

## 2017-03-18 MED ORDER — PHENAZOPYRIDINE HCL 200 MG PO TABS
200.0000 mg | ORAL_TABLET | Freq: Three times a day (TID) | ORAL | Status: DC | PRN
  Administered 2017-03-18: 200 mg via ORAL
  Filled 2017-03-18: qty 1

## 2017-03-18 MED ORDER — CEFEPIME HCL 1 G IJ SOLR
1.0000 g | Freq: Three times a day (TID) | INTRAMUSCULAR | Status: DC
  Administered 2017-03-19 – 2017-03-21 (×7): 1 g via INTRAVENOUS
  Filled 2017-03-18 (×8): qty 1

## 2017-03-18 NOTE — Telephone Encounter (Signed)
Pt. called with c/o "shaking all over, numbness in right index and middle finger, bleeding with urination, backache with urination, pain all over in arms, legs, back, and feet."  Does not have a thermometer, so hasn't checked his temperature.  Reported he had surgery on Monday, for removal of kidney stones and stent placement.  Reported he called Dr. Simone Curia office, and was advised to call PCP, or go to the ER.  Reported he woke up about 8:00 AM with bloody underwear, and the above symptoms. Reported he isn't sure if he is passing any clots with urination.  Stated his urinary stream is "decent."   Advised with the back pain with urination, constant shaking, bloody urine, and numbness of 2 fingers, he should go to the ER.  Advised to have someone drive him to the hospital.  Stated he drove earlier today.  Encouraged him to try and find a driver.  Pt. Agrees with plan.    Reason for Disposition . [1] Back pain AND [2] numbness (loss of sensation) in groin or rectal area  Answer Assessment - Initial Assessment Questions 1. SYMPTOM: "What is the main symptom you are concerned about?" (e.g., weakness, numbness)     Numbness in right index and middle finger 2. ONSET: "When did this start?" (minutes, hours, days; while sleeping)     About 8:00 AM this morning;  3. LAST NORMAL: "When was the last time you were normal (no symptoms)?"     Felt okay 4. PATTERN "Does this come and go, or has it been constant since it started?"  "Is it present now?"    C/o constant pain, back ache with urination 5. CARDIAC SYMPTOMS: "Have you had any of the following symptoms: chest pain, difficulty breathing, palpitations?"     No  6. NEUROLOGIC SYMPTOMS: "Have you had any of the following symptoms: headache, dizziness, vision loss, double vision, changes in speech, unsteady on your feet?"     No  7. OTHER SYMPTOMS: "Do you have any other symptoms?"    C/o Pain all over; "legs, back, feet, knees, arms"    8. PREGNANCY: "Is  there any chance you are pregnant?" "When was your last menstrual period?"    n/a  Protocols used: NEUROLOGIC DEFICIT-A-AH

## 2017-03-18 NOTE — ED Notes (Signed)
Assigned room 1412 @2123 

## 2017-03-18 NOTE — Progress Notes (Signed)
Pharmacy Antibiotic Note  David Armstrong is a 66 y.o. male admitted on 03/18/2017 with sepsis, UTI. Pharmacy has been consulted for cefepime dosing.  Plan: Cefepime 1g IV q8h.  Height: 5\' 11"  (180.3 cm) Weight: 178 lb (80.7 kg) IBW/kg (Calculated) : 75.3  Temp (24hrs), Avg:99.8 F (37.7 C), Min:98.5 F (36.9 C), Max:102.1 F (38.9 C)  Recent Labs  Lab 03/18/17 1635 03/18/17 1640 03/18/17 1952 03/18/17 2002  WBC  --  9.7  --   --   CREATININE  --  1.01  --   --   LATICACIDVEN 2.65*  --  1.17 1.5    Estimated Creatinine Clearance: 77.7 mL/min (by C-G formula based on SCr of 1.01 mg/dL).    Allergies  Allergen Reactions  . Acyclovir And Related   . Zocor [Simvastatin] Other (See Comments)    Muscle ache/ pain  . Dexilant [Dexlansoprazole] Other (See Comments)    Muscle aches  . Ivp Dye [Iodinated Diagnostic Agents] Rash  . Soma [Carisoprodol] Other (See Comments)    Sores on arm  . Sulfa Antibiotics Rash      Thank you for allowing pharmacy to be a part of this patient's care.  David Armstrong 03/18/2017 9:16 PM

## 2017-03-18 NOTE — ED Provider Notes (Signed)
[facility] Provider Note   [name]: [id number] Arrival date & time: 03/18/17  1551     History   Chief Complaint Chief Complaint  Patient presents with  . Near Syncope  . Generalized Body Aches  . Fever    HPI David Armstrong is a 66 y.o. male.  HPI  66 year old male with a history of DVT and PE, nephrolithiasis with recent cystoscopy, lithotripsy, and stent placement on the left with Dr. Karsten Ro 12/3, presents with concern for fever and generalized weakness.  Reports he has had consistent flank pain and dysuria since he had the kidney stone and stent placement, however it worsened today.  Also reports fever at home, sensation of generalized weakness, lightheadedness.  Reports he believes he had a syncopal episode in his bed.  Denies any falls or trauma.  Denies chest pain, shortness of breath.  Does report that he has had a mild cough beginning today.  No abdominal pain, diarrhea.  Past Medical History:  Diagnosis Date  . Anxiety   . Arthritis    "lower back; hips;' right knee"   . Bipolar disorder ([place])   . Chronic constipation   . Depression   . Diverticulosis of colon   . Edema of both lower extremities    ANKLES  . Fatty liver   . GERD (gastroesophageal reflux disease)   . Hiatal hernia   . History of anal fissures   . History of deep vein thrombosis (DVT) of lower extremity 02/11/2016   right lower extremity distal and proximal extensive acute  . History of Helicobacter pylori infection 2012; 2009  . History of kidney stones   . History of pneumonia 01/2015   CAP  . 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 Hyperlipidemia   . IBS (irritable bowel syndrome)   . Left ureteral stone   . Nocturia   . Peripheral neuropathy    per pt due to back DDD  . Personal history of colonic polyps    HYPERPLASTIC POLYP  . Pulmonary nodule, right    incidental finding CT chest angio 02-11-2016 stable  .  TMJ (temporomandibular joint syndrome)   . Varicosities of leg     Patient Active Problem List   Diagnosis Date Noted  . Sepsis ([place]) 03/18/2017  . Hypocalcemia 03/18/2017  . Hyponatremia 03/18/2017  . Pneumonia 03/18/2017  . Acute lower UTI 03/18/2017  . Senile purpura ([place]) 03/12/2017  . Lower urinary tract symptoms (LUTS) 12/18/2016  . Lumbar degenerative disc disease 07/31/2016  . Gross hematuria 03/17/2016  . History of pulmonary embolism and DVT (Oct 2017) 02/11/2016  . Nephrolithiasis 04/19/2014  . Peripheral neuropathy ([place]) 12/15/2013  . Thrombosed external hemorrhoid s/p I&D [id number] 06/09/2012  . 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 IBS (irritable bowel syndrome) 03/09/2012  . 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 GERD 10/26/2007  . Hyperlipidemia 11/23/2006  . Anxiety state 11/23/2006  . DEPRESSION 11/23/2006    Past Surgical History:  Procedure Laterality Date  . BELPHAROPTOSIS REPAIR Bilateral 01/2015  . CATARACT EXTRACTION W/ INTRAOCULAR LENS  IMPLANT, BILATERAL  04/2016  . COLONOSCOPY WITH ESOPHAGOGASTRODUODENOSCOPY (EGD)  last one 03-02-2013  . CYSTOSCOPY/RETROGRADE/URETEROSCOPY/STONE EXTRACTION WITH BASKET  2010  . CYSTOSCOPY/URETEROSCOPY/HOLMIUM LASER/STENT PLACEMENT Left 03/16/2017   Procedure: CYSTOSCOPY/RETROGRADE/URETEROSCOPY/HOLMIUM LASER/STENT PLACEMENT, STONE BASKET EXTRACTION AND STENT PLACEMENT;  Surgeon: [name], MD;  Location: [facility];  Service: Urology;  Laterality: Left;  . EXCISIONAL HEMORRHOIDECTOMY  2015 approx.  Marland Kitchen  EXTRACORPOREAL SHOCK WAVE LITHOTRIPSY  1987; 1992  . FOOT NEUROMA SURGERY Bilateral left 09/2015; right 04/ 2017   morton' neuroma  . FOOT NEUROMA SURGERY Right 2004   morton's neuroma and removal heel spur  . HAMMER TOE SURGERY Left 2013   5th toe  . INCISION AND DRAINAGE FOOT Left 1992   "ball of foot; stepped on piece of hard wire & it went thru my shoe"  . KNEE ARTHROSCOPY Bilateral left x2  1997;  right 2004 & 2005  . NASAL FRACTURE SURGERY  1997  . PATELLA-FEMORAL ARTHROPLASTY Right 07-06-2003   dr Eddie Dibbles [facility]  . PLANTAR FASCIA RELEASE Bilateral 1987 & 1993  . RHINOPLASTY  2000  . SHOULDER ARTHROSCOPY Left 2012;  2013  . SHOULDER ARTHROSCOPY WITH DISTAL CLAVICLE RESECTION Right 07-31-2005   dr Percell Miller  [facility]   w/ Debridement labral and adhesions, acromioplasty, bursectomy, and CA ligament release  . TRANSTHORACIC ECHOCARDIOGRAM  02/12/2016   ef 65-68%, grade 1 diastolic dysfunction/  trivial TR       Home Medications    Prior to Admission medications   Medication Sig Start Date End Date Taking? Authorizing Provider  aspirin EC 81 MG tablet Take 81 mg by mouth daily.   Yes [provider]  desonide (DESOWEN) 0.05 % lotion Apply 1 application topically daily as needed (for skin).   Yes [provider]  hyoscyamine (ANASPAZ) 0.125 MG TBDP disintergrating tablet Place 1 tablet (0.125 mg total) every 6 (six) hours as needed under the tongue. 03/02/17  Yes Vivi Barrack, MD  LORazepam (ATIVAN) 1 MG tablet Take 0.5-1 mg by mouth every 8 (eight) hours as needed for anxiety.  04/25/10  Yes [provider]  omeprazole (PRILOSEC) 20 MG capsule take 1 capsule by mouth once daily 10/03/16  Yes Emeterio Reeve, DO  phenazopyridine (PYRIDIUM) 200 MG tablet Take 1 tablet (200 mg total) by mouth 3 (three) times daily as needed for pain. 03/16/17  Yes [name], MD  fluticasone (CUTIVATE) 0.05 % cream apply to affected area twice a day if needed 10/07/16   [provider]  fluticasone (FLONASE) 50 MCG/ACT nasal spray Place 2 sprays into both nostrils daily as needed for allergies or rhinitis.    [provider]  HYDROcodone-acetaminophen (NORCO) 10-325 MG tablet Take 1-2 tablets by mouth every 4 (four) hours as needed for moderate pain. Maximum dose per 24 hours - 8 pills 03/16/17   [name], MD  sildenafil (VIAGRA) 50 MG tablet Take 50 mg by  mouth as needed for erectile dysfunction.  11/24/16   [provider]    Family History Family History  Problem Relation Age of Onset  . Heart disease Mother   . Stroke Mother   . Cirrhosis Father   . Heart disease Father   . Skin cancer Father   . Prostate cancer Maternal Grandfather   . Parkinson's disease Paternal Grandfather   . Heart disease Brother   . Colon cancer Neg Hx   . Stomach cancer Neg Hx     Social History Social History   Tobacco Use  . Smoking status: Never Smoker  . Smokeless tobacco: Never Used  Substance Use Topics  . Alcohol use: No    Alcohol/week: 0.0 oz  . Drug use: No     Allergies   Acyclovir and related; Zocor [simvastatin]; Dexilant [dexlansoprazole]; Ivp dye [iodinated diagnostic agents]; Soma [carisoprodol]; and Sulfa antibiotics   Review of Systems Review of Systems  Constitutional: Positive for  chills, fatigue and fever.  HENT: Negative for sore throat.   Eyes: Negative for visual disturbance.  Respiratory: Negative for shortness of breath.   Cardiovascular: Negative for chest pain.  Gastrointestinal: Negative for abdominal pain.  Genitourinary: Positive for dysuria, flank pain and hematuria. Negative for difficulty urinating.  Musculoskeletal: Negative for back pain and neck stiffness.  Skin: Negative for rash.  Neurological: Positive for syncope and light-headedness. Negative for headaches.     Physical Exam Updated Vital Signs BP 110/62 (BP Location: Left Arm)   Pulse 76   Temp 98.9 F (37.2 C) (Oral)   Resp (!) 21   Ht 6' (1.829 m)   Wt 81.9 kg (180 lb 8.9 oz)   SpO2 100%   BMI 24.49 kg/m   Physical Exam  Constitutional: He is oriented to person, place, and time. He appears well-developed and well-nourished. No distress.  HENT:  Head: Normocephalic and atraumatic.  Eyes: Conjunctivae and EOM are normal.  Neck: Normal range of motion.  Cardiovascular: Normal rate, regular rhythm, normal heart sounds and  intact distal pulses. Exam reveals no gallop and no friction rub.  No murmur heard. Pulmonary/Chest: Effort normal and breath sounds normal. No respiratory distress. He has no wheezes. He has no rales.  Abdominal: Soft. He exhibits no distension. There is no tenderness. There is no guarding.  Left CVA tenderness  Musculoskeletal: He exhibits no edema.  Neurological: He is alert and oriented to person, place, and time.  Skin: Skin is warm and dry. He is not diaphoretic.  Nursing note and vitals reviewed.    ED Treatments / Results  Labs (all labs ordered are listed, but only abnormal results are displayed) Labs Reviewed  COMPREHENSIVE METABOLIC PANEL - Abnormal; Notable for the following components:      Result Value   Sodium 132 (*)    Calcium 8.0 (*)    Total Protein 6.0 (*)    All other components within normal limits  CBC WITH DIFFERENTIAL/PLATELET - Abnormal; Notable for the following components:   Monocytes Absolute 1.1 (*)    All other components within normal limits  URINALYSIS, ROUTINE W REFLEX MICROSCOPIC - Abnormal; Notable for the following components:   Color, Urine AMBER (*)    APPearance HAZY (*)    Hgb urine dipstick LARGE (*)    Ketones, ur 5 (*)    Protein, ur 100 (*)    Nitrite POSITIVE (*)    Leukocytes, UA TRACE (*)    All other components within normal limits  CBC - Abnormal; Notable for the following components:   RBC 3.81 (*)    Hemoglobin 11.5 (*)    HCT 34.3 (*)    Platelets 111 (*)    All other components within normal limits  BASIC METABOLIC PANEL - Abnormal; Notable for the following components:   Sodium 134 (*)    CO2 20 (*)    Calcium 7.3 (*)    All other components within normal limits  D-DIMER, QUANTITATIVE (NOT AT [facility]) - Abnormal; Notable for the following components:   D-Dimer, Quant 0.61 (*)    All other components within normal limits  I-STAT CG4 LACTIC ACID, ED - Abnormal; Notable for the following components:   Lactic Acid,  Venous 2.65 (*)    All other components within normal limits  CULTURE, BLOOD (ROUTINE X 2)  CULTURE, BLOOD (ROUTINE X 2)  URINE CULTURE  RESPIRATORY PANEL BY PCR  CULTURE, EXPECTORATED SPUTUM-ASSESSMENT  GRAM STAIN  PROTIME-INR  LACTIC ACID, PLASMA  TROPONIN I  PROCALCITONIN  STREP PNEUMONIAE URINARY ANTIGEN  CBG MONITORING, ED  I-STAT CG4 LACTIC ACID, ED    EKG  EKG Interpretation  Date/Time:  Wednesday March 18 2017 16:17:25 EST Ventricular Rate:  97 PR Interval:    QRS Duration: 86 QT Interval:  316 QTC Calculation: 402 R Axis:   14 Text Interpretation:  Sinus rhythm No significant change since last tracing Confirmed by Gareth Morgan [id number]) on 03/18/2017 7:28:05 PM       Radiology Dg Chest 2 View  Result Date: 03/18/2017 CLINICAL DATA:  Fever and left flank pain and to date. Left ureteral stent placed 2 days ago. EXAM: CHEST  2 VIEW COMPARISON:  None. FINDINGS: Heart size normal. Mild pulmonary vascular congestion is present without frank edema. There no effusions. Asymmetric left lower lobe airspace disease is present. Visualized soft tissues and bony thorax are unremarkable. The top of the left double-J ureteral stent is noted. IMPRESSION: 1. Asymmetric left lower lobe airspace disease is concerning for pneumonia. 2. Low lung volumes without other focal airspace disease. Electronically Signed   By: San Morelle M.D.   On: 03/18/2017 17:56   Ct Renal Stone Study  Result Date: 03/18/2017 CLINICAL DATA:  Flank pain and syncope. Recent double-J stent placement on the left EXAM: CT ABDOMEN AND PELVIS WITHOUT CONTRAST TECHNIQUE: Multidetector CT imaging of the abdomen and pelvis was performed following the standard protocol without oral or intravenous contrast material administration. COMPARISON:  January 16, 2017 FINDINGS: Lower chest: There is patchy atelectasis in both lower lobes. There is lower lobe bronchiectatic change bilaterally. There are foci of coronary  artery calcification. There is a focal hiatal hernia. Hepatobiliary: No focal liver lesions are evident on this noncontrast enhanced study. Gallbladder wall is not appreciably thickened. There is no biliary duct dilatation. Pancreas: There is no pancreatic mass or inflammatory focus. Spleen: Spleen measures 14.6 x 11.8 x 8.2 cm with a measured splenic volume of 706 cubic cm. No focal splenic lesions are evident on this noncontrast enhanced study. Adrenals/Urinary Tract: Adrenals appear unremarkable bilaterally. There is no renal mass on either side. Each kidney has an extrarenal pelvis. There is a double-J stent extending from the left renal pelvis to the bladder. There is slight hydronephrosis on the left. There is no appreciable hydronephrosis on the right. There is a 1 mm calculus in the upper pole left kidney. There is a 2 mm calculus in the mid left kidney. In the lower pole region, there is a 1 mm calculus with a nearby 3 mm calculus. No ureteral calculi are evident on this study. Urinary bladder is midline with wall thickness within normal limits. A small amount of air in the urinary bladder is probably due to instrumentation. Stomach/Bowel: There are scattered colonic diverticula without diverticulitis. No bowel wall or mesenteric thickening is evident. There is no appreciable bowel obstruction. No free air or portal venous air evident. Vascular/Lymphatic: Aorta is somewhat tortuous without aneurysm. There are foci of calcification in each proximal iliac artery. Major mesenteric vessels appear patent on this noncontrast enhanced study. No adenopathy is appreciable in the abdomen or pelvis. Reproductive: Prostate and seminal vesicles appear normal in size and contour. No pelvic mass evident. Other: Appendix appears normal. There is fat in each inguinal ring. No abscess or ascites evident in the abdomen or pelvis. Musculoskeletal: There are no blastic or lytic bone lesions. No intramuscular or abdominal wall  lesions. IMPRESSION: 1. Slight hydronephrosis on the left. Double-J stent extends from the  left renal pelvis to the bladder. Nonobstructing calculi noted in left kidney. No ureteral calculi noted on either side. No hydronephrosis on the right. 2. Small amount of air in the urinary bladder is likely due to instrumentation. Has gas-forming infectious organism could present in this manner, correlation with urinalysis advised in this regard. 3.  Splenomegaly of uncertain etiology. 4.  No bowel obstruction.  No abscess.  Appendix appears normal. 5. There is a focal hiatal hernia. There is fat in each inguinal ring. 6.  Iliac artery atherosclerosis noted. 7. Bibasilar lung atelectasis with lower lobe bronchiectatic change bilaterally. Electronically Signed   By: Lowella Grip III M.D.   On: 03/18/2017 19:37    Procedures .Critical Care Performed by: Gareth Morgan, MD Authorized by: Gareth Morgan, MD   Critical care provider statement:    Critical care time (minutes):  30   Critical care was necessary to treat or prevent imminent or life-threatening deterioration of the following conditions:  Sepsis   Critical care was time spent personally by me on the following activities:  Development of treatment plan with patient or surrogate, discussions with consultants, examination of patient, evaluation of patient's response to treatment, pulse oximetry, ordering and review of laboratory studies, ordering and review of radiographic studies, ordering and performing treatments and interventions, review of old charts and re-evaluation of patient's condition Comments:     Sepsis, hypotension, multiple antibioitics and fluid boluses   (including critical care time)  Medications Ordered in ED Medications  azithromycin (ZITHROMAX) 500 mg in dextrose 5 % 250 mL IVPB (not administered)  aspirin EC tablet 81 mg (81 mg Oral Given 03/19/17 1238)  fluticasone (FLONASE) 50 MCG/ACT nasal spray 2 spray (not  administered)  HYDROcodone-acetaminophen (NORCO) 10-325 MG per tablet 1-2 tablet (not administered)  LORazepam (ATIVAN) tablet 0.5-1 mg (not administered)  pantoprazole (PROTONIX) EC tablet 40 mg (40 mg Oral Given 03/19/17 1237)  phenazopyridine (PYRIDIUM) tablet 200 mg (200 mg Oral Given 03/18/17 2353)  hyoscyamine (ANASPAZ) disintergrating tablet 0.125 mg (not administered)  enoxaparin (LOVENOX) injection 40 mg (40 mg Subcutaneous Given 03/18/17 2352)  0.9 %  sodium chloride infusion ( Intravenous New Bag/Given 03/19/17 1238)  ondansetron (ZOFRAN) tablet 4 mg (not administered)    Or  ondansetron (ZOFRAN) injection 4 mg (not administered)  acetaminophen (TYLENOL) tablet 650 mg (650 mg Oral Given 03/19/17 0830)    Or  acetaminophen (TYLENOL) suppository 650 mg ( Rectal See Alternative 03/19/17 0830)  ipratropium-albuterol (DUONEB) 0.5-2.5 (3) MG/3ML nebulizer solution 3 mL (not administered)  guaiFENesin (MUCINEX) 12 hr tablet 600 mg (600 mg Oral Given 03/19/17 1237)  ceFEPIme (MAXIPIME) 1 g in dextrose 5 % 50 mL IVPB (1 g Intravenous New Bag/Given 03/19/17 1239)  sodium chloride 0.9 % bolus 1,000 mL (0 mLs Intravenous Stopped 03/18/17 1914)    And  sodium chloride 0.9 % bolus 1,000 mL (0 mLs Intravenous Stopped 03/18/17 1914)    And  sodium chloride 0.9 % bolus 500 mL (0 mLs Intravenous Stopped 03/18/17 2057)  ceFEPIme (MAXIPIME) 2 g in dextrose 5 % 50 mL IVPB (0 g Intravenous Stopped 03/18/17 1914)  azithromycin (ZITHROMAX) 500 mg in dextrose 5 % 250 mL IVPB (500 mg Intravenous New Bag/Given 03/18/17 1900)  calcium gluconate 1 g in sodium chloride 0.9 % 100 mL IVPB (0 g Intravenous Stopped 03/19/17 0052)     Initial Impression / Assessment and Plan / ED Course  I have reviewed the triage vital signs and the nursing notes.  Pertinent labs &  imaging results that were available during my care of the patient were reviewed by me and considered in my medical decision making (see chart for details).       66 year old male with a history of DVT and PE, nephrolithiasis with recent cystoscopy, lithotripsy, and stent placement on the left with Dr. Karsten Ro 12/3, presents with concern for fever and generalized weakness.  Patient febrile to 102.1 on arrival to the emergency department, with blood pressures down to 88 systolic.  He was given 30cc/kg normal saline and initiated on cefepime for suspected urinary source.  X-ray returned showing concern for possible pneumonia, and added on azithromycin.  Urinalysis is also concerning for urinary tract infection.  Blood pressures stabilized following fluid administration.  Lactic acid improved.   CT stone study was done to evaluate for signs of abscess, stent placement, and hydronephrosis, and showed slight hydronephrosis.  Discussed with Dr. Matilde Sprang of Urology. Recommend medical management of his infection, no need for Urologic evaluation or intervention at this time. Admitted to hospitalist for further care.     Final Clinical Impressions(s) / ED Diagnoses   Final diagnoses:  Sepsis, due to unspecified organism [facility])  Urinary tract infection without hematuria, site unspecified  Pneumonia of left lower lobe due to infectious organism [facility])    ED Discharge Orders    None       Gareth Morgan, MD 03/19/17 1246

## 2017-03-18 NOTE — H&P (Addendum)
History and Physical    SZYMON FOILES TMH:[id number] DOB: 1950-05-28 DOA: 03/18/2017  Referring MD/NP/PA: Dr. Gareth Morgan PCP: Vivi Barrack, MD  Patient coming from: via EMS   Chief Complaint: I feel sick  I have personally briefly reviewed patient's old medical records in [place]   HPI: David Armstrong is a 66 y.o. male with medical history significant of nephrolithiasis, anxiety, DVT not currently on anticoagulation; who presents with complaints of feeling ill.  Reports having 5 kidney stones removed and stent placement 2 days ago with Dr. Karsten Ro of Urology.  For the procedure he was put to sleep and after waking up reported coughing up brown sputum.  He complained of some mild left-sided abdominal tenderness,  intermittent dry cough, and sore throat.  However, this morning when he woke up he complained of generalized malaise, subjective fever, rigors, nausea, generalized muscle aches, decreased appetite, and headache.  Patient reports at one point waking up across his bed, but does not remember lying down and question if he had passed out.  Other associated symptoms include mild shortness of breath, dark urine with decreased urine output.  Denies any chest pain, leg swelling, vomiting, diarrhea, or recent sick contacts to his knowledge.  He reports being up-to-date on his influenza and pneumonia vaccines.   ED Course: Orthostatics with EMS were negative and vital signs were relatively within normal limits.  Upon admission into the emergency department patient was noted to be febrile up to 102.1 F, pulse 84 to 95, respirations 16-25, blood pressure as low as 88/64-118/92, and O2 saturations maintained.  Labs revealed WBC 9.7, hemoglobin 13.2, platelets 159, sodium 132, calcium 8, and lactic acid initially 2.65.  Urinalysis was positive for nitrites, trace leukocytes, TNTC RBCs, TNTC WBCs, but no bacteria seen.  Chest x-ray showed asymmetric left lower lobe opacity concerning  for pneumonia.  CT scan of the abdomen showed signs of left-sided hydronephrosis, splenomegaly, and possible air in the bladder.  The on-call urologist was notified, but did not report any need for intervention from a urology standpoint at this time.  Review of Systems  Constitutional: Positive for chills, fever and malaise/fatigue.  HENT: Positive for sore throat. Negative for ear pain.   Eyes: Negative for pain and discharge.  Respiratory: Positive for cough and shortness of breath.   Cardiovascular: Negative for chest pain and leg swelling.  Gastrointestinal: Positive for abdominal pain and nausea. Negative for constipation, diarrhea and vomiting.  Genitourinary: Positive for dysuria and flank pain.       Positive for decrease urine output  Musculoskeletal: Positive for myalgias. Negative for falls.  Skin: Negative for itching and rash.  Neurological: Positive for loss of consciousness (Possible) and weakness. Negative for speech change and focal weakness.  Psychiatric/Behavioral: Negative for substance abuse and suicidal ideas.    Past Medical History:  Diagnosis Date  . Anxiety   . Arthritis    "lower back; hips;' right knee"   . Bipolar disorder ([place])   . Chronic constipation   . Depression   . Diverticulosis of colon   . Edema of both lower extremities    ANKLES  . Fatty liver   . GERD (gastroesophageal reflux disease)   . Hiatal hernia   . History of anal fissures   . History of deep vein thrombosis (DVT) of lower extremity 02/11/2016   right lower extremity distal and proximal extensive acute  . History of Helicobacter pylori infection 2012; 2009  . History of kidney  stones   . History of pneumonia 01/2015   CAP  . 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 Hyperlipidemia   . IBS (irritable bowel syndrome)   . Left ureteral stone   . Nocturia   . Peripheral neuropathy    per pt due to back DDD  . Personal history of colonic polyps     HYPERPLASTIC POLYP  . Pulmonary nodule, right    incidental finding CT chest angio 02-11-2016 stable  . TMJ (temporomandibular joint syndrome)   . Varicosities of leg     Past Surgical History:  Procedure Laterality Date  . BELPHAROPTOSIS REPAIR Bilateral 01/2015  . CATARACT EXTRACTION W/ INTRAOCULAR LENS  IMPLANT, BILATERAL  04/2016  . COLONOSCOPY WITH ESOPHAGOGASTRODUODENOSCOPY (EGD)  last one 03-02-2013  . CYSTOSCOPY/RETROGRADE/URETEROSCOPY/STONE EXTRACTION WITH BASKET  2010  . CYSTOSCOPY/URETEROSCOPY/HOLMIUM LASER/STENT PLACEMENT Left 03/16/2017   Procedure: CYSTOSCOPY/RETROGRADE/URETEROSCOPY/HOLMIUM LASER/STENT PLACEMENT, STONE BASKET EXTRACTION AND STENT PLACEMENT;  Surgeon: [name], MD;  Location: [facility];  Service: Urology;  Laterality: Left;  . EXCISIONAL HEMORRHOIDECTOMY  2015 approx.  Marland Kitchen EXTRACORPOREAL SHOCK WAVE LITHOTRIPSY  1987; 1992  . FOOT NEUROMA SURGERY Bilateral left 09/2015; right 04/ 2017   morton' neuroma  . FOOT NEUROMA SURGERY Right 2004   morton's neuroma and removal heel spur  . HAMMER TOE SURGERY Left 2013   5th toe  . INCISION AND DRAINAGE FOOT Left 1992   "ball of foot; stepped on piece of hard wire & it went thru my shoe"  . KNEE ARTHROSCOPY Bilateral left x2 1997;  right 2004 & 2005  . NASAL FRACTURE SURGERY  1997  . PATELLA-FEMORAL ARTHROPLASTY Right 07-06-2003   dr Eddie Dibbles [facility]  . PLANTAR FASCIA RELEASE Bilateral 1987 & 1993  . RHINOPLASTY  2000  . SHOULDER ARTHROSCOPY Left 2012;  2013  . SHOULDER ARTHROSCOPY WITH DISTAL CLAVICLE RESECTION Right 07-31-2005   dr Percell Miller  [facility]   w/ Debridement labral and adhesions, acromioplasty, bursectomy, and CA ligament release  . TRANSTHORACIC ECHOCARDIOGRAM  02/12/2016   ef 54-62%, grade 1 diastolic dysfunction/  trivial TR     reports that  has never smoked. he has never used smokeless tobacco. He reports that he does not drink alcohol or use drugs.  Allergies  Allergen Reactions  .  Acyclovir And Related   . Zocor [Simvastatin] Other (See Comments)    Muscle ache/ pain  . Dexilant [Dexlansoprazole] Other (See Comments)    Muscle aches  . Ivp Dye [Iodinated Diagnostic Agents] Rash  . Soma [Carisoprodol] Other (See Comments)    Sores on arm  . Sulfa Antibiotics Rash    Family History  Problem Relation Age of Onset  . Heart disease Mother   . Stroke Mother   . Cirrhosis Father   . Heart disease Father   . Skin cancer Father   . Prostate cancer Maternal Grandfather   . Parkinson's disease Paternal Grandfather   . Heart disease Brother   . Colon cancer Neg Hx   . Stomach cancer Neg Hx     Prior to Admission medications   Medication Sig Start Date End Date Taking? Authorizing Provider  aspirin EC 81 MG tablet Take 81 mg by mouth daily.   Yes [provider]  desonide (DESOWEN) 0.05 % lotion Apply 1 application topically daily as needed (for skin).   Yes [provider]  hyoscyamine (ANASPAZ) 0.125 MG TBDP disintergrating tablet Place 1 tablet (0.125 mg total) every 6 (six) hours  as needed under the tongue. 03/02/17  Yes Vivi Barrack, MD  LORazepam (ATIVAN) 1 MG tablet Take 0.5-1 mg by mouth every 8 (eight) hours as needed for anxiety.  04/25/10  Yes [provider]  omeprazole (PRILOSEC) 20 MG capsule take 1 capsule by mouth once daily 10/03/16  Yes Emeterio Reeve, DO  phenazopyridine (PYRIDIUM) 200 MG tablet Take 1 tablet (200 mg total) by mouth 3 (three) times daily as needed for pain. 03/16/17  Yes [name], MD  fluticasone (CUTIVATE) 0.05 % cream apply to affected area twice a day if needed 10/07/16   [provider]  fluticasone (FLONASE) 50 MCG/ACT nasal spray Place 2 sprays into both nostrils daily as needed for allergies or rhinitis.    [provider]  HYDROcodone-acetaminophen (NORCO) 10-325 MG tablet Take 1-2 tablets by mouth every 4 (four) hours as needed for moderate pain. Maximum dose per 24 hours -  8 pills 03/16/17   [name], MD  sildenafil (VIAGRA) 50 MG tablet Take 50 mg by mouth as needed for erectile dysfunction.  11/24/16   [provider]    Physical Exam:  Constitutional: Sick appearing elderly male who is able to follow commands at this time Vitals:   03/18/17 1811 03/18/17 1821 03/18/17 1830 03/18/17 1845  BP: 100/67  105/63   Pulse: 84  85 85  Resp: 20  18 (!) 24  Temp:  98.9 F (37.2 C)    TempSrc:  Oral    SpO2: 98%  99% 100%  Weight:      Height:       Eyes: PERRL, lids and conjunctivae normal ENMT: Mucous membranes are dry Posterior pharynx clear of any exudate or lesions.  Neck: normal, supple, no masses, no thyromegaly Respiratory: Regular respiratory rate with rales noted on the left lower lung field.  No wheezes or rhonchi appreciated.  Patient able to talk in complete sentences. Cardiovascular: Regular rate and rhythm, no murmurs / rubs / gallops. Trace-1+ lower extremity edema laterally. 2+ pedal pulses. No carotid bruits.  Abdomen: Mild tenderness palpation of the left quadrants, no masses palpated. No hepatosplenomegaly. Bowel sounds positive.  Musculoskeletal: no clubbing / cyanosis. No joint deformity upper and lower extremities. Good ROM, no contractures. Normal muscle tone.  Skin: no rashes, lesions, ulcers. No induration Neurologic: CN 2-12 grossly intact. Sensation intact, DTR normal. Strength 5/5 in all 4.  Psychiatric: Normal judgment and insight. Alert and oriented x 3. Normal mood.     Labs on Admission: I have personally reviewed following labs and imaging studies  CBC: Recent Labs  Lab 03/18/17 1640  WBC 9.7  NEUTROABS 7.5  HGB 13.2  HCT 39.0  MCV 89.9  PLT 035   Basic Metabolic Panel: Recent Labs  Lab 03/18/17 1640  NA 132*  K 3.6  CL 101  CO2 25  GLUCOSE 97  BUN 16  CREATININE 1.01  CALCIUM 8.0*   GFR: Estimated Creatinine Clearance: 77.7 mL/min (by C-G formula based on SCr of 1.01 mg/dL). Liver  Function Tests: Recent Labs  Lab 03/18/17 1640  AST 24  ALT 17  ALKPHOS 82  BILITOT 1.1  PROT 6.0*  ALBUMIN 3.6   No results for input(s): LIPASE, AMYLASE in the last 168 hours. No results for input(s): AMMONIA in the last 168 hours. Coagulation Profile: Recent Labs  Lab 03/18/17 1640  INR 1.04   Cardiac Enzymes: No results for input(s): CKTOTAL, CKMB, CKMBINDEX, TROPONINI in the last 168 hours. BNP (last 3 results)  No results for input(s): PROBNP in the last 8760 hours. HbA1C: No results for input(s): HGBA1C in the last 72 hours. CBG: Recent Labs  Lab 03/18/17 1645  GLUCAP 84   Lipid Profile: No results for input(s): CHOL, HDL, LDLCALC, TRIG, CHOLHDL, LDLDIRECT in the last 72 hours. Thyroid Function Tests: No results for input(s): TSH, T4TOTAL, FREET4, T3FREE, THYROIDAB in the last 72 hours. Anemia Panel: No results for input(s): VITAMINB12, FOLATE, FERRITIN, TIBC, IRON, RETICCTPCT in the last 72 hours. Urine analysis:    Component Value Date/Time   COLORURINE AMBER (A) 03/18/2017 1736   APPEARANCEUR HAZY (A) 03/18/2017 1736   LABSPEC 1.013 03/18/2017 1736   PHURINE 8.0 03/18/2017 1736   GLUCOSEU NEGATIVE 03/18/2017 1736   GLUCOSEU NEGATIVE 11/11/2016 1536   HGBUR LARGE (A) 03/18/2017 1736   HGBUR negative 06/02/2008 1026   BILIRUBINUR NEGATIVE 03/18/2017 1736   BILIRUBINUR Negative 12/18/2016 0905   KETONESUR 5 (A) 03/18/2017 1736   PROTEINUR 100 (A) 03/18/2017 1736   UROBILINOGEN 0.2 12/18/2016 0905   UROBILINOGEN 0.2 11/11/2016 1536   NITRITE POSITIVE (A) 03/18/2017 1736   LEUKOCYTESUR TRACE (A) 03/18/2017 1736   Sepsis Labs: No results found for this or any previous visit (from the past 240 hour(s)).   Radiological Exams on Admission: Dg Chest 2 View  Result Date: 03/18/2017 CLINICAL DATA:  Fever and left flank pain and to date. Left ureteral stent placed 2 days ago. EXAM: CHEST  2 VIEW COMPARISON:  None. FINDINGS: Heart size normal. Mild pulmonary  vascular congestion is present without frank edema. There no effusions. Asymmetric left lower lobe airspace disease is present. Visualized soft tissues and bony thorax are unremarkable. The top of the left double-J ureteral stent is noted. IMPRESSION: 1. Asymmetric left lower lobe airspace disease is concerning for pneumonia. 2. Low lung volumes without other focal airspace disease. Electronically Signed   By: San Morelle M.D.   On: 03/18/2017 17:56   Ct Renal Stone Study  Result Date: 03/18/2017 CLINICAL DATA:  Flank pain and syncope. Recent double-J stent placement on the left EXAM: CT ABDOMEN AND PELVIS WITHOUT CONTRAST TECHNIQUE: Multidetector CT imaging of the abdomen and pelvis was performed following the standard protocol without oral or intravenous contrast material administration. COMPARISON:  January 16, 2017 FINDINGS: Lower chest: There is patchy atelectasis in both lower lobes. There is lower lobe bronchiectatic change bilaterally. There are foci of coronary artery calcification. There is a focal hiatal hernia. Hepatobiliary: No focal liver lesions are evident on this noncontrast enhanced study. Gallbladder wall is not appreciably thickened. There is no biliary duct dilatation. Pancreas: There is no pancreatic mass or inflammatory focus. Spleen: Spleen measures 14.6 x 11.8 x 8.2 cm with a measured splenic volume of 706 cubic cm. No focal splenic lesions are evident on this noncontrast enhanced study. Adrenals/Urinary Tract: Adrenals appear unremarkable bilaterally. There is no renal mass on either side. Each kidney has an extrarenal pelvis. There is a double-J stent extending from the left renal pelvis to the bladder. There is slight hydronephrosis on the left. There is no appreciable hydronephrosis on the right. There is a 1 mm calculus in the upper pole left kidney. There is a 2 mm calculus in the mid left kidney. In the lower pole region, there is a 1 mm calculus with a nearby 3 mm  calculus. No ureteral calculi are evident on this study. Urinary bladder is midline with wall thickness within normal limits. A small amount of air in the urinary bladder is  probably due to instrumentation. Stomach/Bowel: There are scattered colonic diverticula without diverticulitis. No bowel wall or mesenteric thickening is evident. There is no appreciable bowel obstruction. No free air or portal venous air evident. Vascular/Lymphatic: Aorta is somewhat tortuous without aneurysm. There are foci of calcification in each proximal iliac artery. Major mesenteric vessels appear patent on this noncontrast enhanced study. No adenopathy is appreciable in the abdomen or pelvis. Reproductive: Prostate and seminal vesicles appear normal in size and contour. No pelvic mass evident. Other: Appendix appears normal. There is fat in each inguinal ring. No abscess or ascites evident in the abdomen or pelvis. Musculoskeletal: There are no blastic or lytic bone lesions. No intramuscular or abdominal wall lesions. IMPRESSION: 1. Slight hydronephrosis on the left. Double-J stent extends from the left renal pelvis to the bladder. Nonobstructing calculi noted in left kidney. No ureteral calculi noted on either side. No hydronephrosis on the right. 2. Small amount of air in the urinary bladder is likely due to instrumentation. Has gas-forming infectious organism could present in this manner, correlation with urinalysis advised in this regard. 3.  Splenomegaly of uncertain etiology. 4.  No bowel obstruction.  No abscess.  Appendix appears normal. 5. There is a focal hiatal hernia. There is fat in each inguinal ring. 6.  Iliac artery atherosclerosis noted. 7. Bibasilar lung atelectasis with lower lobe bronchiectatic change bilaterally. Electronically Signed   By: Lowella Grip III M.D.   On: 03/18/2017 19:37    EKG: Independently reviewed.  Sinus rhythm similar to previous EKGs.  Assessment/Plan Sepsis 2/2 suspected pneumonia  and/or UTI: Acute.  Patient presents febrile to 102.1 F, tachypneic, and initial lactic acid 2.65.  Urinalysis was positive for signs of infection with hematuria.  Chest x-ray showed signs of possible left lower lobe pneumonia.  Sepsis protocol was initiated with full fluid bolus and patient was started on empiric antibiotics of cefepime and azithromycin.  Constellation of symptoms also gives concern for the possibility of viral causes like influenza as well. - Admit to a telemetry bed - Sepsis protocol initiated - Follow-up blood, sputum, and urine cultures - Check respiratory virus panel - Added on pro-calcitonin level - Continue empiric antibiotics of cefepime and azithromycin - Mucinex - Incentive spirometry  Low blood pressure: Resolving.  Patient initially presented with blood pressures as low as 88/64.  Blood pressures improved with IV fluids. - Continuous IV fluids overnight normal saline at 100 ml/hr  Possible syncopal event - check troponin and d- dimer - Follow-up telemetry overnight  Nephrolithiasis, left sided hydronephrosis, hematuria: Patient just underwent renal stone removal with subsequent double-J stent placement by Dr. Karsten Ro on 12/3.  CT scans show some mild left-sided hydronephrosis.  Urology was notified, but thought to be likely secondary to the procedure. - Continue hyoscyamine prn spasm, Pyridium prn dysuria, and hydrocodone prn pain - Will likely need continued outpatient follow-up with urology  Hyponatremia: Acute.  Suspect symptoms could be secondary to fluid loss related to fever and patient reports a poor appetite. - Recheck BMP in a.m.  Hypocalcemia: Patient's calcium level was noted to be 8 on admission.  This could be the cause of patient's myalgias. - Give 1 g of calcium gluconate IV - Continue to monitor and replace as needed  H/O PE/ DVT: Previously back in 2017 not currently on anticoagulation. -  Check D-dimer  Anxiety - Continue Ativan   DVT  prophylaxis: lovenox  Code Status: Full  Family Communication: none Disposition Plan: Likely discharge home in 2-3  days Consults called: none  Admission status: Inpatient  Norval Morton MD [facility] Pager [phone]   If 7PM-7AM, please contact night-coverage [email or web address] Password [facility]  03/18/2017, 8:23 PM

## 2017-03-18 NOTE — ED Notes (Signed)
1st blood culture right hand 5cc 1627

## 2017-03-18 NOTE — ED Notes (Signed)
Patient transported to CT 

## 2017-03-18 NOTE — Progress Notes (Signed)
A consult was received from an ED physician for Cefepime per pharmacy dosing.  The patient's profile has been reviewed for ht/wt/allergies/indication/available labs.   A one time order has been placed for Cefepime 2g.  Further antibiotics/pharmacy consults should be ordered by admitting physician if indicated.                       Thank you, [name] 03/18/2017  5:02 PM

## 2017-03-18 NOTE — ED Triage Notes (Signed)
Pt BIB ems with c.o generalized weakness and possible syncope. Patient reports having kidney surgery with stent placement on Monday. He states he woke up across bed and doesn't remember lying down so thinks he passed out. Per ems patient negative for orthostatic hypotension, ambulatory but weak, A&O x4, neuro intact. Does report decrease urination and fluid/appetite since surgery. VS: NSR, 104/62, 95, 18, 98%RA, NSR, CBG 85.

## 2017-03-18 NOTE — ED Notes (Signed)
Bed: WA22 Expected date:  Expected time:  Means of arrival:  Comments: Triage 1  

## 2017-03-19 DIAGNOSIS — N200 Calculus of kidney: Secondary | ICD-10-CM

## 2017-03-19 DIAGNOSIS — Z86711 Personal history of pulmonary embolism: Secondary | ICD-10-CM

## 2017-03-19 DIAGNOSIS — A419 Sepsis, unspecified organism: Principal | ICD-10-CM

## 2017-03-19 DIAGNOSIS — N390 Urinary tract infection, site not specified: Secondary | ICD-10-CM

## 2017-03-19 DIAGNOSIS — J181 Lobar pneumonia, unspecified organism: Secondary | ICD-10-CM

## 2017-03-19 LAB — BASIC METABOLIC PANEL
ANION GAP: 5 (ref 5–15)
BUN: 12 mg/dL (ref 6–20)
CALCIUM: 7.3 mg/dL — AB (ref 8.9–10.3)
CO2: 20 mmol/L — AB (ref 22–32)
Chloride: 109 mmol/L (ref 101–111)
Creatinine, Ser: 0.85 mg/dL (ref 0.61–1.24)
GFR calc non Af Amer: >60 mL/min (ref >60)
Glucose, Bld: 91 mg/dL (ref 65–99)
POTASSIUM: 3.7 mmol/L (ref 3.5–5.1)
Sodium: 134 mmol/L — ABNORMAL LOW (ref 135–145)

## 2017-03-19 LAB — STREP PNEUMONIAE URINARY ANTIGEN: STREP PNEUMO URINARY ANTIGEN: NEGATIVE

## 2017-03-19 LAB — CBC
HEMATOCRIT: 34.3 % — AB (ref 39.0–52.0)
HEMOGLOBIN: 11.5 g/dL — AB (ref 13.0–17.0)
MCH: 30.2 pg (ref 26.0–34.0)
MCHC: 33.5 g/dL (ref 30.0–36.0)
MCV: 90 fL (ref 78.0–100.0)
Platelets: 111 10*3/uL — ABNORMAL LOW (ref 150–400)
RBC: 3.81 MIL/uL — AB (ref 4.22–5.81)
RDW: 14 % (ref 11.5–15.5)
WBC: 6.7 10*3/uL (ref 4.0–10.5)

## 2017-03-19 LAB — RESPIRATORY PANEL BY PCR
ADENOVIRUS-RVPPCR: NOT DETECTED
BORDETELLA PERTUSSIS-RVPCR: NOT DETECTED
CHLAMYDOPHILA PNEUMONIAE-RVPPCR: NOT DETECTED
CORONAVIRUS 229E-RVPPCR: NOT DETECTED
Coronavirus HKU1: NOT DETECTED
Coronavirus NL63: NOT DETECTED
Coronavirus OC43: NOT DETECTED
INFLUENZA B-RVPPCR: NOT DETECTED
Influenza A: NOT DETECTED
METAPNEUMOVIRUS-RVPPCR: NOT DETECTED
MYCOPLASMA PNEUMONIAE-RVPPCR: NOT DETECTED
Parainfluenza Virus 1: NOT DETECTED
Parainfluenza Virus 2: NOT DETECTED
Parainfluenza Virus 3: NOT DETECTED
Parainfluenza Virus 4: NOT DETECTED
RESPIRATORY SYNCYTIAL VIRUS-RVPPCR: NOT DETECTED
Rhinovirus / Enterovirus: NOT DETECTED

## 2017-03-19 MED ORDER — AZITHROMYCIN 250 MG PO TABS
500.0000 mg | ORAL_TABLET | Freq: Every day | ORAL | Status: DC
  Administered 2017-03-19 – 2017-03-20 (×2): 500 mg via ORAL
  Filled 2017-03-19 (×3): qty 2

## 2017-03-19 NOTE — Progress Notes (Signed)
PROGRESS NOTE    David Armstrong  DZH:[id number] DOB: 1950/10/19 DOA: 03/18/2017 PCP: Vivi Barrack, MD  Brief Narrative: David Armstrong is a 66 y.o. male with medical history significant of nephrolithiasis, anxiety, DVT not currently on anticoagulation; who presents with complaints of feeling ill.  Reports having 5 kidney stones removed and stent placement 2 days ago with Dr. Karsten Ro of Urology.  Patient reports having generalized weakness fevers Reiger shortness of breath nausea.  He was admitted for further evaluation of the above.   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  On admission patient was febrile tachypneic tachycardic with elevated lactic acid, hypotensive, abnormal UA and abnormal chest x-ray showing possible left lower lobe pneumonia.  Patient was started on empiric antibiotics IV cefepime and azithromycin.  Blood cultures, sputum cultures, urine cultures sent.  Respiratory virus panel was negative. Resume IV antibiotics and will request urology consult.   Presyncopal episode Possibly secondary to orthostatic hypotension.   Nephrolithiasis, left-sided hydronephrosis, hematuria, pyelonephritis. Status post double-J stent placement by Dr. Karsten Ro on 12 /3 Of follow-up with urology as recommended.  History of PE/DVT back in 2017. Not on anticoagulation at this time. Asymptomatic.   Anemia : hemoglobin around 11.   DVT prophylaxis: Lovenox Code Status: Full code Family Communication: None at bedside. Disposition Plan: Pending resolution of symptoms   Consultants:   Urology   Procedures: None   Antimicrobials: Cefepime  and azithromycin since admission   Subjective: It is feeling reports not feeling good  today.  Objective: Vitals:   03/18/17 2223 03/19/17 0639 03/19/17 0754 03/19/17 1411  BP:  110/62  110/60  Pulse:  76  66  Resp: (!) 23 (!) 21  16  Temp: 98.9 F (37.2 C) 98.9 F (37.2 C)  97.8 F (36.6 C)  TempSrc: Oral Oral  Oral  SpO2: 100% 95% 100% 100%  Weight: 81.9 kg (180 lb 8.9 oz)     Height: 6' (1.829 m)       Intake/Output Summary (Last 24 hours) at 03/19/2017 1532 Last data filed at 03/19/2017 1414 Gross per 24 hour  Intake 4053.34 ml  Output 2425 ml  Net 1628.34 ml   Filed Weights   03/18/17 1607 03/18/17 2223  Weight: 80.7 kg (178 lb) 81.9 kg (180 lb 8.9 oz)    Examination:  General exam: Appears calm and comfortable  Respiratory system: Clear to auscultation. Respiratory effort normal. Cardiovascular system: S1 & S2 heard, RRR. No JVD, murmurs, rubs, gallops or clicks. No pedal edema. Gastrointestinal system: Abdomen is nondistended, soft and nontender. No organomegaly or masses felt. Normal bowel sounds heard. Central nervous system: Alert and oriented. No focal neurological deficits. Extremities: Symmetric 5 x 5 power. Skin: No rashes, lesions or ulcers Psychiatry: Judgement and insight appear normal. Mood & affect appropriate.     Data Reviewed: I have personally reviewed following labs and imaging studies  CBC: Recent Labs  Lab 03/18/17 1640 03/19/17 0356  WBC 9.7 6.7  NEUTROABS 7.5  --   HGB 13.2 11.5*  HCT 39.0 34.3*  MCV 89.9 90.0  PLT 159 419*   Basic Metabolic Panel: Recent Labs  Lab 03/18/17 1640 03/19/17 0356  NA 132* 134*  K 3.6 3.7  CL 101 109  CO2 25  20*  GLUCOSE 97 91  BUN 16 12  CREATININE 1.01 0.85  CALCIUM 8.0* 7.3*   GFR: Estimated Creatinine Clearance: 95.1 mL/min (by C-G formula based on SCr of 0.85 mg/dL). Liver Function Tests: Recent Labs  Lab 03/18/17 1640  AST 24  ALT 17  ALKPHOS 82  BILITOT 1.1  PROT 6.0*  ALBUMIN 3.6   No results for input(s): LIPASE, AMYLASE in the last 168 hours. No results  for input(s): AMMONIA in the last 168 hours. Coagulation Profile: Recent Labs  Lab 03/18/17 1640  INR 1.04   Cardiac Enzymes: Recent Labs  Lab 03/18/17 2046  TROPONINI <0.03   BNP (last 3 results) No results for input(s): PROBNP in the last 8760 hours. HbA1C: No results for input(s): HGBA1C in the last 72 hours. CBG: Recent Labs  Lab 03/18/17 1645  GLUCAP 84   Lipid Profile: No results for input(s): CHOL, HDL, LDLCALC, TRIG, CHOLHDL, LDLDIRECT in the last 72 hours. Thyroid Function Tests: No results for input(s): TSH, T4TOTAL, FREET4, T3FREE, THYROIDAB in the last 72 hours. Anemia Panel: No results for input(s): VITAMINB12, FOLATE, FERRITIN, TIBC, IRON, RETICCTPCT in the last 72 hours. Sepsis Labs: Recent Labs  Lab 03/18/17 1635 03/18/17 1639 03/18/17 1952 03/18/17 2002  PROCALCITON  --  0.22  --   --   LATICACIDVEN 2.65*  --  1.17 1.5    Recent Results (from the past 240 hour(s))  Culture, blood (Routine x 2)     Status: None (Preliminary result)   Collection Time: 03/18/17  4:25 PM  Result Value Ref Range Status   Specimen Description BLOOD RIGHT HAND  Final   Special Requests   Final    BOTTLES DRAWN AEROBIC ONLY Blood Culture adequate volume   Culture   Final    NO GROWTH < 24 HOURS Performed at [facility], [place] [address] [place], [place] [address]    Report Status PENDING  Incomplete  Culture, blood (Routine x 2)     Status: None (Preliminary result)   Collection Time: 03/18/17  6:18 PM  Result Value Ref Range Status   Specimen Description BLOOD LEFT FOREARM  Final   Special Requests   Final    BOTTLES DRAWN AEROBIC AND ANAEROBIC Blood Culture adequate volume   Culture   Final    NO GROWTH < 12 HOURS Performed at [facility], [place] [address] [place], [place] [address]    Report Status PENDING  Incomplete  Respiratory Panel by PCR     Status: None   Collection Time: 03/19/17 12:10 AM  Result Value Ref Range Status   Adenovirus NOT  DETECTED NOT DETECTED Final   Coronavirus 229E NOT DETECTED NOT DETECTED Final   Coronavirus HKU1 NOT DETECTED NOT DETECTED Final   Coronavirus NL63 NOT DETECTED NOT DETECTED Final   Coronavirus OC43 NOT DETECTED NOT DETECTED Final   Metapneumovirus NOT DETECTED NOT DETECTED Final   Rhinovirus / Enterovirus NOT DETECTED NOT DETECTED Final   Influenza A NOT DETECTED NOT DETECTED Final   Influenza B NOT DETECTED NOT DETECTED Final   Parainfluenza Virus 1 NOT DETECTED NOT DETECTED Final   Parainfluenza Virus 2 NOT DETECTED NOT DETECTED Final   Parainfluenza Virus 3 NOT DETECTED NOT DETECTED Final   Parainfluenza Virus 4 NOT DETECTED NOT DETECTED Final   Respiratory Syncytial Virus NOT DETECTED NOT DETECTED Final   Bordetella pertussis NOT DETECTED NOT DETECTED Final   Chlamydophila pneumoniae NOT DETECTED NOT DETECTED Final   Mycoplasma  pneumoniae NOT DETECTED NOT DETECTED Final    Comment: Performed at [facility], [place] [address] [place], [place] [address]         Radiology Studies: Dg Chest 2 View  Result Date: 03/18/2017 CLINICAL DATA:  Fever and left flank pain and to date. Left ureteral stent placed 2 days ago. EXAM: CHEST  2 VIEW COMPARISON:  None. FINDINGS: Heart size normal. Mild pulmonary vascular congestion is present without frank edema. There no effusions. Asymmetric left lower lobe airspace disease is present. Visualized soft tissues and bony thorax are unremarkable. The top of the left double-J ureteral stent is noted. IMPRESSION: 1. Asymmetric left lower lobe airspace disease is concerning for pneumonia. 2. Low lung volumes without other focal airspace disease. Electronically Signed   By: San Morelle M.D.   On: 03/18/2017 17:56   Ct Renal Stone Study  Result Date: 03/18/2017 CLINICAL DATA:  Flank pain and syncope. Recent double-J stent placement on the left EXAM: CT ABDOMEN AND PELVIS WITHOUT CONTRAST TECHNIQUE: Multidetector CT imaging of the abdomen and  pelvis was performed following the standard protocol without oral or intravenous contrast material administration. COMPARISON:  January 16, 2017 FINDINGS: Lower chest: There is patchy atelectasis in both lower lobes. There is lower lobe bronchiectatic change bilaterally. There are foci of coronary artery calcification. There is a focal hiatal hernia. Hepatobiliary: No focal liver lesions are evident on this noncontrast enhanced study. Gallbladder wall is not appreciably thickened. There is no biliary duct dilatation. Pancreas: There is no pancreatic mass or inflammatory focus. Spleen: Spleen measures 14.6 x 11.8 x 8.2 cm with a measured splenic volume of 706 cubic cm. No focal splenic lesions are evident on this noncontrast enhanced study. Adrenals/Urinary Tract: Adrenals appear unremarkable bilaterally. There is no renal mass on either side. Each kidney has an extrarenal pelvis. There is a double-J stent extending from the left renal pelvis to the bladder. There is slight hydronephrosis on the left. There is no appreciable hydronephrosis on the right. There is a 1 mm calculus in the upper pole left kidney. There is a 2 mm calculus in the mid left kidney. In the lower pole region, there is a 1 mm calculus with a nearby 3 mm calculus. No ureteral calculi are evident on this study. Urinary bladder is midline with wall thickness within normal limits. A small amount of air in the urinary bladder is probably due to instrumentation. Stomach/Bowel: There are scattered colonic diverticula without diverticulitis. No bowel wall or mesenteric thickening is evident. There is no appreciable bowel obstruction. No free air or portal venous air evident. Vascular/Lymphatic: Aorta is somewhat tortuous without aneurysm. There are foci of calcification in each proximal iliac artery. Major mesenteric vessels appear patent on this noncontrast enhanced study. No adenopathy is appreciable in the abdomen or pelvis. Reproductive: Prostate and  seminal vesicles appear normal in size and contour. No pelvic mass evident. Other: Appendix appears normal. There is fat in each inguinal ring. No abscess or ascites evident in the abdomen or pelvis. Musculoskeletal: There are no blastic or lytic bone lesions. No intramuscular or abdominal wall lesions. IMPRESSION: 1. Slight hydronephrosis on the left. Double-J stent extends from the left renal pelvis to the bladder. Nonobstructing calculi noted in left kidney. No ureteral calculi noted on either side. No hydronephrosis on the right. 2. Small amount of air in the urinary bladder is likely due to instrumentation. Has gas-forming infectious organism could present in this manner, correlation with urinalysis advised in this  regard. 3.  Splenomegaly of uncertain etiology. 4.  No bowel obstruction.  No abscess.  Appendix appears normal. 5. There is a focal hiatal hernia. There is fat in each inguinal ring. 6.  Iliac artery atherosclerosis noted. 7. Bibasilar lung atelectasis with lower lobe bronchiectatic change bilaterally. Electronically Signed   By: Lowella Grip III M.D.   On: 03/18/2017 19:37        Scheduled Meds: . aspirin EC  81 mg Oral Daily  . azithromycin  500 mg Oral Daily  . enoxaparin (LOVENOX) injection  40 mg Subcutaneous QHS  . guaiFENesin  600 mg Oral BID  . pantoprazole  40 mg Oral Daily   Continuous Infusions: . sodium chloride 100 mL/hr at 03/19/17 1238  . ceFEPime (MAXIPIME) IV 1 g (03/19/17 1239)     LOS: 1 day    Time spent: 35 minutes.     Hosie Poisson, MD [facility] Pager [phone]  If 7PM-7AM, please contact night-coverage [email or web address] Password [facility] 03/19/2017, 3:32 PM

## 2017-03-19 NOTE — Progress Notes (Signed)
PHARMACIST - PHYSICIAN COMMUNICATION DR:   [place]ula CONCERNING: Antibiotic IV to Oral Route Change Policy  RECOMMENDATION: This patient is receiving Azithromycin by the intravenous route.  Based on criteria approved by the Pharmacy and Therapeutics Committee, the antibiotic(s) is/are being converted to the equivalent oral dose form(s).   DESCRIPTION: These criteria include:  Patient being treated for a respiratory tract infection, urinary tract infection, cellulitis or clostridium difficile associated diarrhea if on metronidazole  The patient is not neutropenic and does not exhibit a GI malabsorption state  The patient is eating (either orally or via tube) and/or has been taking other orally administered medications for a least 24 hours  The patient is improving clinically and has a Tmax < 100.5  If you have questions about this conversion, please contact the Pharmacy Department  []  ( [phone] )  [place] []  ( [phone]  [facility] []  ( [phone]  [place] []  ( [phone]  [facility] [x]  ( [phone] )  [facility]    

## 2017-03-19 NOTE — Plan of Care (Signed)
  Education: Knowledge of General Education information will improve 03/19/2017 0111 - Progressing by Marvie Calender, Sherryll Burger, RN   Educated on Droplet precautions. Patient verbalized understanding

## 2017-03-20 LAB — URINE CULTURE

## 2017-03-20 MED ORDER — ALUM & MAG HYDROXIDE-SIMETH 200-200-20 MG/5ML PO SUSP
30.0000 mL | ORAL | Status: DC | PRN
  Administered 2017-03-20 – 2017-03-23 (×7): 30 mL via ORAL
  Filled 2017-03-20 (×7): qty 30

## 2017-03-20 NOTE — Progress Notes (Signed)
PROGRESS NOTE    David Armstrong  HWE:[id number] DOB: 1950/04/22 DOA: 03/18/2017 PCP: Vivi Barrack, MD  Brief Narrative: David Armstrong is a 66 y.o. male with medical history significant of nephrolithiasis, anxiety, DVT not currently on anticoagulation; who presents with complaints of feeling ill.  Reports having 5 kidney stones removed and stent placement 2 days ago with Dr. Karsten Ro of Urology.  Patient reports having generalized weakness fevers Reiger shortness of breath nausea.  He was admitted for further evaluation of the above.   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  On admission patient was febrile tachypneic tachycardic with elevated lactic acid, hypotensive, abnormal UA and abnormal chest x-ray showing possible left lower lobe pneumonia.  Patient was started on empiric antibiotics IV cefepime and azithromycin.  Blood cultures, sputum cultures, urine cultures sent.  Respiratory virus panel was negative. Febrile overnight, would recommend continuing broad-spectrum antibiotics for another 24 hours.  Urine cultures showed multiple bacteria, blood cultures have been negative so far    Presyncopal episode Possibly secondary to orthostatic hypotension.  Repeat orthostatic vital signs in the morning   Nephrolithiasis, left-sided hydronephrosis, hematuria, pyelonephritis. Status post double-J stent placement by Dr. Karsten Ro on 12 /3 Of follow-up with urology as recommended.  History of PE/DVT back in 2017. Not on anticoagulation at this time. Asymptomatic.   Anemia : hemoglobin around 11.   DVT prophylaxis: Lovenox Code Status: Full code Family Communication: None at bedside. Disposition Plan: Pending resolution of symptoms possibly home in the next  24 hours.   Consultants:   Urology   Procedures: None   Antimicrobials: Cefepime  and azithromycin since admission   Subjective: Patient reports feeling weak tired.  No chest pain shortness of breath or cough.  Febrile overnight and some chills.  No nausea vomiting or abdominal pain  Objective: Vitals:   03/19/17 2004 03/19/17 2110 03/20/17 0510 03/20/17 1455  BP: 108/67  103/62 119/68  Pulse: 78  71 66  Resp: 20  20 (!) 22  Temp: (!) 102.2 F (39 C) 99.5 F (37.5 C) 98.7 F (37.1 C) 97.8 F (36.6 C)  TempSrc: Axillary Oral Oral Oral  SpO2: 96%  98% 100%  Weight:      Height:        Intake/Output Summary (Last 24 hours) at 03/20/2017 1714 Last data filed at 03/20/2017 1547 Gross per 24 hour  Intake 2790 ml  Output 2475 ml  Net 315 ml   Filed Weights   03/18/17 1607 03/18/17 2223  Weight: 80.7 kg (178 lb) 81.9 kg (180 lb 8.9 oz)    Examination:  General exam: Appears calm and comfortable not in any kind of distress Respiratory system: Clear to auscultation. Respiratory effort normal.  No wheezing or rhonchi Cardiovascular system: S1 & S2 heard, RRR. No JVD, murmurs, rubs, gallops or clicks. No pedal edema. Gastrointestinal system: Abdomen is soft nontender nondistended bowel sounds are good.  No flank pain Central nervous system: Alert and oriented. No focal neurological deficits. Extremities: Symmetric 5 x 5 power. Skin: No rashes, lesions or ulcers Psychiatry: Judgement and insight appear normal.    Data Reviewed: I have personally reviewed following labs and imaging studies  CBC: Recent Labs  Lab 03/18/17 1640 03/19/17 0356  WBC 9.7 6.7  NEUTROABS 7.5  --   HGB 13.2 11.5*  HCT 39.0 34.3*  MCV 89.9 90.0  PLT 159 182*   Basic Metabolic Panel: Recent Labs  Lab 03/18/17 1640 03/19/17 0356  NA 132* 134*  K 3.6 3.7  CL 101 109  CO2 25 20*  GLUCOSE 97 91  BUN 16 12  CREATININE 1.01 0.85  CALCIUM 8.0* 7.3*   GFR: Estimated Creatinine  Clearance: 93.8 mL/min (by C-G formula based on SCr of 0.85 mg/dL). Liver Function Tests: Recent Labs  Lab 03/18/17 1640  AST 24  ALT 17  ALKPHOS 82  BILITOT 1.1  PROT 6.0*  ALBUMIN 3.6   No results for input(s): LIPASE, AMYLASE in the last 168 hours. No results for input(s): AMMONIA in the last 168 hours. Coagulation Profile: Recent Labs  Lab 03/18/17 1640  INR 1.04   Cardiac Enzymes: Recent Labs  Lab 03/18/17 2046  TROPONINI <0.03   BNP (last 3 results) No results for input(s): PROBNP in the last 8760 hours. HbA1C: No results for input(s): HGBA1C in the last 72 hours. CBG: Recent Labs  Lab 03/18/17 1645  GLUCAP 84   Lipid Profile: No results for input(s): CHOL, HDL, LDLCALC, TRIG, CHOLHDL, LDLDIRECT in the last 72 hours. Thyroid Function Tests: No results for input(s): TSH, T4TOTAL, FREET4, T3FREE, THYROIDAB in the last 72 hours. Anemia Panel: No results for input(s): VITAMINB12, FOLATE, FERRITIN, TIBC, IRON, RETICCTPCT in the last 72 hours. Sepsis Labs: Recent Labs  Lab 03/18/17 1635 03/18/17 1639 03/18/17 1952 03/18/17 2002  PROCALCITON  --  0.22  --   --   LATICACIDVEN 2.65*  --  1.17 1.5    Recent Results (from the past 240 hour(s))  Culture, blood (Routine x 2)     Status: None (Preliminary result)   Collection Time: 03/18/17  4:25 PM  Result Value Ref Range Status   Specimen Description BLOOD RIGHT HAND  Final   Special Requests   Final    BOTTLES DRAWN AEROBIC ONLY Blood Culture adequate volume   Culture   Final    NO GROWTH 2 DAYS Performed at [facility], [place] [address] [place], [place] [address]    Report Status PENDING  Incomplete  Urine culture     Status: Abnormal   Collection Time: 03/18/17  5:36 PM  Result Value Ref Range Status   Specimen Description URINE, CLEAN CATCH  Final   Special Requests NONE  Final   Culture MULTIPLE SPECIES PRESENT, SUGGEST RECOLLECTION (A)  Final   Report Status 03/20/2017 FINAL  Final    Culture, blood (Routine x 2)     Status: None (Preliminary result)   Collection Time: 03/18/17  6:18 PM  Result Value Ref Range Status   Specimen Description BLOOD LEFT FOREARM  Final   Special Requests   Final    BOTTLES DRAWN AEROBIC AND ANAEROBIC Blood Culture adequate volume   Culture   Final    NO GROWTH 2 DAYS Performed at [facility], [place] [address] [place],  [address]    Report Status PENDING  Incomplete  Respiratory Panel by PCR     Status: None   Collection Time: 03/19/17 12:10 AM  Result Value Ref Range Status   Adenovirus NOT DETECTED NOT DETECTED Final   Coronavirus 229E NOT DETECTED NOT DETECTED Final   Coronavirus HKU1 NOT DETECTED NOT DETECTED Final   Coronavirus NL63 NOT DETECTED NOT DETECTED Final   Coronavirus OC43 NOT DETECTED NOT DETECTED Final  Metapneumovirus NOT DETECTED NOT DETECTED Final   Rhinovirus / Enterovirus NOT DETECTED NOT DETECTED Final   Influenza A NOT DETECTED NOT DETECTED Final   Influenza B NOT DETECTED NOT DETECTED Final   Parainfluenza Virus 1 NOT DETECTED NOT DETECTED Final   Parainfluenza Virus 2 NOT DETECTED NOT DETECTED Final   Parainfluenza Virus 3 NOT DETECTED NOT DETECTED Final   Parainfluenza Virus 4 NOT DETECTED NOT DETECTED Final   Respiratory Syncytial Virus NOT DETECTED NOT DETECTED Final   Bordetella pertussis NOT DETECTED NOT DETECTED Final   Chlamydophila pneumoniae NOT DETECTED NOT DETECTED Final   Mycoplasma pneumoniae NOT DETECTED NOT DETECTED Final    Comment: Performed at [facility], [place] [address], [place], [place] [address]         Radiology Studies: Dg Chest 2 View  Result Date: 03/18/2017 CLINICAL DATA:  Fever and left flank pain and to date. Left ureteral stent placed 2 days ago. EXAM: CHEST  2 VIEW COMPARISON:  None. FINDINGS: Heart size normal. Mild pulmonary vascular congestion is present without frank edema. There no effusions. Asymmetric left lower lobe airspace disease is  present. Visualized soft tissues and bony thorax are unremarkable. The top of the left double-J ureteral stent is noted. IMPRESSION: 1. Asymmetric left lower lobe airspace disease is concerning for pneumonia. 2. Low lung volumes without other focal airspace disease. Electronically Signed   By: San Morelle M.D.   On: 03/18/2017 17:56   Ct Renal Stone Study  Result Date: 03/18/2017 CLINICAL DATA:  Flank pain and syncope. Recent double-J stent placement on the left EXAM: CT ABDOMEN AND PELVIS WITHOUT CONTRAST TECHNIQUE: Multidetector CT imaging of the abdomen and pelvis was performed following the standard protocol without oral or intravenous contrast material administration. COMPARISON:  January 16, 2017 FINDINGS: Lower chest: There is patchy atelectasis in both lower lobes. There is lower lobe bronchiectatic change bilaterally. There are foci of coronary artery calcification. There is a focal hiatal hernia. Hepatobiliary: No focal liver lesions are evident on this noncontrast enhanced study. Gallbladder wall is not appreciably thickened. There is no biliary duct dilatation. Pancreas: There is no pancreatic mass or inflammatory focus. Spleen: Spleen measures 14.6 x 11.8 x 8.2 cm with a measured splenic volume of 706 cubic cm. No focal splenic lesions are evident on this noncontrast enhanced study. Adrenals/Urinary Tract: Adrenals appear unremarkable bilaterally. There is no renal mass on either side. Each kidney has an extrarenal pelvis. There is a double-J stent extending from the left renal pelvis to the bladder. There is slight hydronephrosis on the left. There is no appreciable hydronephrosis on the right. There is a 1 mm calculus in the upper pole left kidney. There is a 2 mm calculus in the mid left kidney. In the lower pole region, there is a 1 mm calculus with a nearby 3 mm calculus. No ureteral calculi are evident on this study. Urinary bladder is midline with wall thickness within normal limits.  A small amount of air in the urinary bladder is probably due to instrumentation. Stomach/Bowel: There are scattered colonic diverticula without diverticulitis. No bowel wall or mesenteric thickening is evident. There is no appreciable bowel obstruction. No free air or portal venous air evident. Vascular/Lymphatic: Aorta is somewhat tortuous without aneurysm. There are foci of calcification in each proximal iliac artery. Major mesenteric vessels appear patent on this noncontrast enhanced study. No adenopathy is appreciable in the abdomen or pelvis. Reproductive: Prostate and seminal vesicles appear normal in size and contour.  No pelvic mass evident. Other: Appendix appears normal. There is fat in each inguinal ring. No abscess or ascites evident in the abdomen or pelvis. Musculoskeletal: There are no blastic or lytic bone lesions. No intramuscular or abdominal wall lesions. IMPRESSION: 1. Slight hydronephrosis on the left. Double-J stent extends from the left renal pelvis to the bladder. Nonobstructing calculi noted in left kidney. No ureteral calculi noted on either side. No hydronephrosis on the right. 2. Small amount of air in the urinary bladder is likely due to instrumentation. Has gas-forming infectious organism could present in this manner, correlation with urinalysis advised in this regard. 3.  Splenomegaly of uncertain etiology. 4.  No bowel obstruction.  No abscess.  Appendix appears normal. 5. There is a focal hiatal hernia. There is fat in each inguinal ring. 6.  Iliac artery atherosclerosis noted. 7. Bibasilar lung atelectasis with lower lobe bronchiectatic change bilaterally. Electronically Signed   By: Lowella Grip III M.D.   On: 03/18/2017 19:37        Scheduled Meds: . aspirin EC  81 mg Oral Daily  . azithromycin  500 mg Oral Daily  . enoxaparin (LOVENOX) injection  40 mg Subcutaneous QHS  . guaiFENesin  600 mg Oral BID  . pantoprazole  40 mg Oral Daily   Continuous Infusions: .  sodium chloride 100 mL/hr at 03/19/17 1238  . ceFEPime (MAXIPIME) IV Stopped (03/20/17 1230)     LOS: 2 days    Time spent: 35 minutes.     Hosie Poisson, MD [facility] Pager [phone]  If 7PM-7AM, please contact night-coverage [email or web address] Password [email or web address] 03/20/2017, 5:14 PM

## 2017-03-21 DIAGNOSIS — E871 Hypo-osmolality and hyponatremia: Secondary | ICD-10-CM

## 2017-03-21 LAB — BASIC METABOLIC PANEL
Anion gap: 6 (ref 5–15)
BUN: 8 mg/dL (ref 6–20)
CALCIUM: 7.6 mg/dL — AB (ref 8.9–10.3)
CO2: 24 mmol/L (ref 22–32)
CREATININE: 0.71 mg/dL (ref 0.61–1.24)
Chloride: 107 mmol/L (ref 101–111)
GFR calc Af Amer: >60 mL/min (ref >60)
GLUCOSE: 86 mg/dL (ref 65–99)
Potassium: 3.5 mmol/L (ref 3.5–5.1)
SODIUM: 137 mmol/L (ref 135–145)

## 2017-03-21 LAB — CBC WITH DIFFERENTIAL/PLATELET
Basophils Absolute: 0 10*3/uL (ref 0.0–0.1)
Basophils Relative: 0 %
EOS ABS: 0.2 10*3/uL (ref 0.0–0.7)
EOS PCT: 3 %
HCT: 33.9 % — ABNORMAL LOW (ref 39.0–52.0)
Hemoglobin: 11.5 g/dL — ABNORMAL LOW (ref 13.0–17.0)
LYMPHS ABS: 1 10*3/uL (ref 0.7–4.0)
LYMPHS PCT: 21 %
MCH: 29.9 pg (ref 26.0–34.0)
MCHC: 33.9 g/dL (ref 30.0–36.0)
MCV: 88.3 fL (ref 78.0–100.0)
MONO ABS: 0.7 10*3/uL (ref 0.1–1.0)
Monocytes Relative: 15 %
Neutro Abs: 2.9 10*3/uL (ref 1.7–7.7)
Neutrophils Relative %: 61 %
PLATELETS: 122 10*3/uL — AB (ref 150–400)
RBC: 3.84 MIL/uL — ABNORMAL LOW (ref 4.22–5.81)
RDW: 13.6 % (ref 11.5–15.5)
WBC: 4.8 10*3/uL (ref 4.0–10.5)

## 2017-03-21 MED ORDER — LEVOFLOXACIN 750 MG PO TABS
750.0000 mg | ORAL_TABLET | Freq: Every day | ORAL | Status: AC
  Administered 2017-03-21 – 2017-03-23 (×3): 750 mg via ORAL
  Filled 2017-03-21 (×3): qty 1

## 2017-03-21 NOTE — Plan of Care (Signed)
Upon my first assessment pt stent string is out approx 9 inches which the patient states is about 7 inches more that it had been.  Pt is voiding well.  Dr Dayle Points on call and notified.  Stent retaped up to head of penis.

## 2017-03-21 NOTE — Evaluation (Signed)
Physical Therapy Evaluation Patient Details Name: David Armstrong MRN: [id number] DOB: Feb 06, 1951 Today's Date: 03/21/2017   History of Present Illness   66 y.o. male with medical history significant of nephrolithiasis, anxiety, DVT not currently on anticoagulation; who presents with complaints of feeling ill.  Reports having 5 kidney stones removed and stent placement 2 days ago with Dr. Karsten Armstrong of Urology.  Patient reports having generalized weakness,  fevers, shortness of breath nausea.   Clinical Impression  Pt is independent with mobility, he ambulated 800' in hall without assistive device, no loss of balance, HR in 80s. No further PT indicated. Encouraged pt to ambulate in halls 2-3x/day to minimize deconditioning. PT signing off.      Follow Up Recommendations No PT follow up    Equipment Recommendations  None recommended by PT    Recommendations for Other Services       Precautions / Restrictions Precautions Precautions: None Precaution Comments: denies h/o falls in past 1 year Restrictions Weight Bearing Restrictions: No      Mobility  Bed Mobility Overal bed mobility: Independent                Transfers Overall transfer level: Independent                  Ambulation/Gait Ambulation/Gait assistance: Independent Ambulation Distance (Feet): 800 Feet Assistive device: None Gait Pattern/deviations: WFL(Within Functional Limits)     General Gait Details: steady, no LOB, HR 80s  Stairs            Wheelchair Mobility    Modified Rankin (Stroke Patients Only)       Balance Overall balance assessment: Independent                                           Pertinent Vitals/Pain Pain Assessment: No/denies pain    Home Living Family/patient expects to be discharged to:: Private residence Living Arrangements: Alone Available Help at Discharge: Family;Available PRN/intermittently         Home Layout: Two level;Able  to live on main level with bedroom/bathroom Home Equipment: None      Prior Function Level of Independence: Independent         Comments: walks 2-3 miles/day     Hand Dominance        Extremity/Trunk Assessment   Upper Extremity Assessment Upper Extremity Assessment: Overall WFL for tasks assessed    Lower Extremity Assessment Lower Extremity Assessment: Overall WFL for tasks assessed    Cervical / Trunk Assessment Cervical / Trunk Assessment: Normal  Communication   Communication: No difficulties  Cognition Arousal/Alertness: Awake/alert Behavior During Therapy: WFL for tasks assessed/performed Overall Cognitive Status: Within Functional Limits for tasks assessed                                        General Comments      Exercises     Assessment/Plan    PT Assessment Patent does not need any further PT services  PT Problem List         PT Treatment Interventions      PT Goals (Current goals can be found in the Care Plan section)  Acute Rehab PT Goals Patient Stated Goal: walk 2-3 miles/day PT Goal Formulation: All assessment and education complete, DC  therapy    Frequency     Barriers to discharge        Co-evaluation               AM-PAC PT "6 Clicks" Daily Activity  Outcome Measure Difficulty turning over in bed (including adjusting bedclothes, sheets and blankets)?: None Difficulty moving from lying on back to sitting on the side of the bed? : None Difficulty sitting down on and standing up from a chair with arms (e.g., wheelchair, bedside commode, etc,.)?: None Help needed moving to and from a bed to chair (including a wheelchair)?: None Help needed walking in hospital room?: None Help needed climbing 3-5 steps with a railing? : None 6 Click Score: 24    End of Session Equipment Utilized During Treatment: Gait belt Activity Tolerance: Patient tolerated treatment well Patient left: in chair;with call bell/phone  within reach Nurse Communication: Mobility status      Time: 1700-1719 PT Time Calculation (min) (ACUTE ONLY): 19 min   Charges:   PT Evaluation $PT Eval Low Complexity: 1 Low     PT G CodesPhilomena Armstrong 03/21/2017, 5:22 PM [phone]

## 2017-03-21 NOTE — Progress Notes (Signed)
PROGRESS NOTE    NAKHI CHOI  PIR:[id number] DOB: 27-Jan-1951 DOA: 03/18/2017 PCP: Vivi Barrack, MD  Brief Narrative: David Armstrong is a 66 y.o. male with medical history significant of nephrolithiasis, anxiety, DVT not currently on anticoagulation; who presents with complaints of feeling ill.  Reports having 5 kidney stones removed and stent placement 2 days ago with Dr. Karsten Ro of Urology.  Patient reports having generalized weakness fevers Reiger shortness of breath nausea.  He was admitted for further evaluation of the above.   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  On admission patient was febrile tachypneic tachycardic with elevated lactic acid, hypotensive, abnormal UA and abnormal chest x-ray showing possible left lower lobe pneumonia.  Patient was started on empiric antibiotics IV cefepime and azithromycin.  Blood cultures, sputum cultures, urine cultures sent.  Respiratory virus panel was negative.   Urine cultures showed multiple bacteria, blood cultures have been negative so far. The antibiotics to oral Levaquin    Presyncopal episode Possibly secondary to orthostatic hypotension.  Repeat orthostatic vital signs in the morning   Nephrolithiasis, left-sided hydronephrosis, hematuria, pyelonephritis. Status post double-J stent placement by Dr. Karsten Ro on 12 /3 Of follow-up with urology as recommended.  History of PE/DVT back in 2017. Not on anticoagulation at this time. Asymptomatic.   Anemia : hemoglobin around 11.   DVT prophylaxis: Lovenox Code Status: Full code Family Communication: None at bedside. Disposition Plan: Pending resolution of symptoms possibly home in the next 24 hours.   Consultants:   Urology   Procedures:  None   Antimicrobials: Cefepime  and azithromycin since admission   Subjective: Patient reports generalized body aches, tired, exhausted Objective: Vitals:   03/20/17 1455 03/20/17 2248 03/21/17 0550 03/21/17 1300  BP: 119/68 105/62 121/69 114/73  Pulse: 66 74 69 73  Resp: (!) 22 20 18 18   Temp: 97.8 F (36.6 C) 99.6 F (37.6 C) 98.3 F (36.8 C) 98.7 F (37.1 C)  TempSrc: Oral Oral Oral Oral  SpO2: 100% 98% 96% 100%  Weight:      Height:        Intake/Output Summary (Last 24 hours) at 03/21/2017 1745 Last data filed at 03/21/2017 1400 Gross per 24 hour  Intake 1553.33 ml  Output 4150 ml  Net -2596.67 ml   Filed Weights   03/18/17 1607 03/18/17 2223  Weight: 80.7 kg (178 lb) 81.9 kg (180 lb 8.9 oz)    Examination:  General exam: Appears calm and comfortable not in any kind of distress Respiratory system: Clear to auscultation. Respiratory effort normal.  No wheezing or rhonchi Cardiovascular system: S1 & S2 heard, RRR. No JVD, murmurs, rubs, gallops or clicks. No pedal edema. Gastrointestinal system: Abdomen is soft nontender nondistended bowel sounds are good.  No flank pain Central nervous system: Alert and oriented. No focal neurological deficits. Extremities: Symmetric 5 x 5 power. Skin: No rashes, lesions or ulcers Psychiatry: Judgement and insight appear normal.    Data Reviewed: I have personally reviewed following labs and imaging studies  CBC: Recent Labs  Lab 03/18/17 1640 03/19/17 0356 03/21/17 1148  WBC 9.7 6.7 4.8  NEUTROABS 7.5  --  2.9  HGB 13.2 11.5* 11.5*  HCT 39.0 34.3* 33.9*  MCV 89.9 90.0 88.3  PLT  159 111* 789*   Basic Metabolic Panel: Recent Labs  Lab 03/18/17 1640 03/19/17 0356 03/21/17 1148  NA 132* 134* 137  K 3.6 3.7 3.5  CL 101 109 107  CO2 25 20* 24  GLUCOSE 97 91 86  BUN 16 12 8   CREATININE 1.01 0.85 0.71  CALCIUM 8.0* 7.3* 7.6*   GFR: Estimated Creatinine Clearance: 99.7 mL/min (by C-G formula based on SCr of  0.71 mg/dL). Liver Function Tests: Recent Labs  Lab 03/18/17 1640  AST 24  ALT 17  ALKPHOS 82  BILITOT 1.1  PROT 6.0*  ALBUMIN 3.6   No results for input(s): LIPASE, AMYLASE in the last 168 hours. No results for input(s): AMMONIA in the last 168 hours. Coagulation Profile: Recent Labs  Lab 03/18/17 1640  INR 1.04   Cardiac Enzymes: Recent Labs  Lab 03/18/17 2046  TROPONINI <0.03   BNP (last 3 results) No results for input(s): PROBNP in the last 8760 hours. HbA1C: No results for input(s): HGBA1C in the last 72 hours. CBG: Recent Labs  Lab 03/18/17 1645  GLUCAP 84   Lipid Profile: No results for input(s): CHOL, HDL, LDLCALC, TRIG, CHOLHDL, LDLDIRECT in the last 72 hours. Thyroid Function Tests: No results for input(s): TSH, T4TOTAL, FREET4, T3FREE, THYROIDAB in the last 72 hours. Anemia Panel: No results for input(s): VITAMINB12, FOLATE, FERRITIN, TIBC, IRON, RETICCTPCT in the last 72 hours. Sepsis Labs: Recent Labs  Lab 03/18/17 1635 03/18/17 1639 03/18/17 1952 03/18/17 2002  PROCALCITON  --  0.22  --   --   LATICACIDVEN 2.65*  --  1.17 1.5    Recent Results (from the past 240 hour(s))  Culture, blood (Routine x 2)     Status: None (Preliminary result)   Collection Time: 03/18/17  4:25 PM  Result Value Ref Range Status   Specimen Description BLOOD RIGHT HAND  Final   Special Requests   Final    BOTTLES DRAWN AEROBIC ONLY Blood Culture adequate volume   Culture   Final    NO GROWTH 3 DAYS Performed at [facility], [name] [address], [place], [place] [address]    Report Status PENDING  Incomplete  Urine culture     Status: Abnormal   Collection Time: 03/18/17  5:36 PM  Result Value Ref Range Status   Specimen Description URINE, CLEAN CATCH  Final   Special Requests NONE  Final   Culture MULTIPLE SPECIES PRESENT, SUGGEST RECOLLECTION (A)  Final   Report Status 03/20/2017 FINAL  Final  Culture, blood (Routine x 2)     Status: None (Preliminary  result)   Collection Time: 03/18/17  6:18 PM  Result Value Ref Range Status   Specimen Description BLOOD LEFT FOREARM  Final   Special Requests   Final    BOTTLES DRAWN AEROBIC AND ANAEROBIC Blood Culture adequate volume   Culture   Final    NO GROWTH 3 DAYS Performed at [facility], [place] [address] [place], [place] [address]    Report Status PENDING  Incomplete  Respiratory Panel by PCR     Status: None   Collection Time: 03/19/17 12:10 AM  Result Value Ref Range Status   Adenovirus NOT DETECTED NOT DETECTED Final   Coronavirus 229E NOT DETECTED NOT DETECTED Final   Coronavirus HKU1 NOT DETECTED NOT DETECTED Final   Coronavirus NL63 NOT DETECTED NOT DETECTED Final   Coronavirus OC43 NOT DETECTED NOT DETECTED Final   Metapneumovirus NOT DETECTED NOT DETECTED Final  Rhinovirus / Enterovirus NOT DETECTED NOT DETECTED Final   Influenza A NOT DETECTED NOT DETECTED Final   Influenza B NOT DETECTED NOT DETECTED Final   Parainfluenza Virus 1 NOT DETECTED NOT DETECTED Final   Parainfluenza Virus 2 NOT DETECTED NOT DETECTED Final   Parainfluenza Virus 3 NOT DETECTED NOT DETECTED Final   Parainfluenza Virus 4 NOT DETECTED NOT DETECTED Final   Respiratory Syncytial Virus NOT DETECTED NOT DETECTED Final   Bordetella pertussis NOT DETECTED NOT DETECTED Final   Chlamydophila pneumoniae NOT DETECTED NOT DETECTED Final   Mycoplasma pneumoniae NOT DETECTED NOT DETECTED Final    Comment: Performed at [facility], [place] [address] [place], [place] [address]         Radiology Studies: No results found.      Scheduled Meds: . aspirin EC  81 mg Oral Daily  . enoxaparin (LOVENOX) injection  40 mg Subcutaneous QHS  . guaiFENesin  600 mg Oral BID  . pantoprazole  40 mg Oral Daily   Continuous Infusions: . sodium chloride 100 mL/hr at 03/21/17 1414     LOS: 3 days    Time spent: 35 minutes.     Hosie Poisson, MD [facility] Pager [phone]  If 7PM-7AM,  please contact night-coverage [email or web address] Password [facility] 03/21/2017, 5:45 PM

## 2017-03-22 ENCOUNTER — Encounter (HOSPITAL_COMMUNITY): Payer: Self-pay

## 2017-03-22 ENCOUNTER — Inpatient Hospital Stay (HOSPITAL_COMMUNITY): Payer: Medicare Other

## 2017-03-22 NOTE — Progress Notes (Signed)
Continued to void well thru night.  The stent has not moved since beginning of shift.  No increase in pain

## 2017-03-22 NOTE — Progress Notes (Signed)
Contacted by nurse about David Armstrong. He is s/p L USE with Dr. Karsten Ro on 12/3. Subsequently admitted for presume UTI. He has been doing well. Afebrile for > 48 hours, WBC 4.8, Cr 0.7. On oral abx.   Some concern that stent string was pulled out. No leaking. No flank pain. Will obtain KUB today to evaluate position of stent.

## 2017-03-22 NOTE — Progress Notes (Signed)
PROGRESS NOTE    David Armstrong  NFA:[id number] DOB: 05-28-1950 DOA: 03/18/2017 PCP: Vivi Barrack, MD  Brief Narrative: David Armstrong is a 66 y.o. male with medical history significant of nephrolithiasis, anxiety, DVT not currently on anticoagulation; who presents with complaints of feeling ill.  Reports having 5 kidney stones removed and stent placement 2 days ago with Dr. Karsten Ro of Urology.  Patient reports having generalized weakness fevers Reiger shortness of breath nausea.  He was admitted for further evaluation of the above.   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  On admission patient was febrile tachypneic tachycardic with elevated lactic acid, hypotensive, abnormal UA and abnormal chest x-ray showing possible left lower lobe pneumonia.  Patient was started on empiric antibiotics IV cefepime and azithromycin.  Blood cultures, sputum cultures, urine cultures sent.  Respiratory virus panel was negative.lactic acid normalized.    Urine cultures showed multiple bacteria, blood cultures have been negative so far. Sputum cultures not taken. Transitioned the antibiotics to oral Levaquin to complete the course. Pt reports he is not back to baseline, reports feeling congested, having trouble breathing on ambulation.     Presyncopal episode Possibly secondary to orthostatic hypotension. Negative orthostatics.    Nephrolithiasis, left-sided hydronephrosis, hematuria, pyelonephritis. Status post double-J stent placement by Dr. Karsten Ro on 12 /3 Of follow-up with urology as recommended. No change.   History of PE/DVT back in 2017. Not on anticoagulation at this time. Asymptomatic.   Anemia : hemoglobin around 11. Stable .    Hyponatremia resolved.    DVT prophylaxis: Lovenox Code Status: Full code Family Communication: None at bedside. Disposition Plan: Home tomorrow if he feels better.    Consultants:   Urology   Procedures: None   Antimicrobials: Cefepime  and azithromycin since admission   Subjective: Reports congested and sob on ambulation. Weaned him off oxygen.  Objective: Vitals:   03/21/17 1300 03/21/17 2106 03/22/17 0108 03/22/17 0428  BP: 114/73 122/68  (!) 97/54  Pulse: 73 72  62  Resp: 18 16  18   Temp: 98.7 F (37.1 C) 98.1 F (36.7 C) 99.1 F (37.3 C) 97.6 F (36.4 C)  TempSrc: Oral Oral Oral Oral  SpO2: 100% 100%  100%  Weight:      Height:        Intake/Output Summary (Last 24 hours) at 03/22/2017 1332 Last data filed at 03/22/2017 0432 Gross per 24 hour  Intake 2313.33 ml  Output 3350 ml  Net -1036.67 ml   Filed Weights   03/18/17 1607 03/18/17 2223  Weight: 80.7 kg (178 lb) 81.9 kg (180 lb 8.9 oz)    Examination:  General exam: Appears calm and comfortable appears uncomfortable.  Respiratory system: air entry fair, no wheezing or rhonchi.  Cardiovascular system: S1 & S2 heard RRR, no murmer or JVD.  Gastrointestinal system: Abdomen is soft NT ND BS+ Central nervous system: Alert and oriented. No focal neurological deficits. Extremities: Symmetric 5 x 5 power. Skin: No rashes, lesions or ulcers Psychiatry: Judgement and insight appear normal. Mood normal.     Data Reviewed: I have personally reviewed following labs and imaging studies  CBC: Recent Labs  Lab 03/18/17 1640 03/19/17 0356 03/21/17 1148  WBC 9.7 6.7 4.8  NEUTROABS 7.5  --  2.9  HGB 13.2 11.5* 11.5*  HCT 39.0 34.3* 33.9*  MCV 89.9 90.0 88.3  PLT 159 111* 767*   Basic Metabolic Panel: Recent Labs  Lab 03/18/17 1640 03/19/17 0356 03/21/17 1148  NA 132* 134* 137  K 3.6 3.7 3.5  CL 101 109 107  CO2 25 20* 24  GLUCOSE 97 91 86  BUN 16 12 8   CREATININE 1.01 0.85 0.71  CALCIUM 8.0* 7.3* 7.6*    GFR: Estimated Creatinine Clearance: 99.7 mL/min (by C-G formula based on SCr of 0.71 mg/dL). Liver Function Tests: Recent Labs  Lab 03/18/17 1640  AST 24  ALT 17  ALKPHOS 82  BILITOT 1.1  PROT 6.0*  ALBUMIN 3.6   No results for input(s): LIPASE, AMYLASE in the last 168 hours. No results for input(s): AMMONIA in the last 168 hours. Coagulation Profile: Recent Labs  Lab 03/18/17 1640  INR 1.04   Cardiac Enzymes: Recent Labs  Lab 03/18/17 2046  TROPONINI <0.03   BNP (last 3 results) No results for input(s): PROBNP in the last 8760 hours. HbA1C: No results for input(s): HGBA1C in the last 72 hours. CBG: Recent Labs  Lab 03/18/17 1645  GLUCAP 84   Lipid Profile: No results for input(s): CHOL, HDL, LDLCALC, TRIG, CHOLHDL, LDLDIRECT in the last 72 hours. Thyroid Function Tests: No results for input(s): TSH, T4TOTAL, FREET4, T3FREE, THYROIDAB in the last 72 hours. Anemia Panel: No results for input(s): VITAMINB12, FOLATE, FERRITIN, TIBC, IRON, RETICCTPCT in the last 72 hours. Sepsis Labs: Recent Labs  Lab 03/18/17 1635 03/18/17 1639 03/18/17 1952 03/18/17 2002  PROCALCITON  --  0.22  --   --   LATICACIDVEN 2.65*  --  1.17 1.5    Recent Results (from the past 240 hour(s))  Culture, blood (Routine x 2)     Status: None (Preliminary result)   Collection Time: 03/18/17  4:25 PM  Result Value Ref Range Status   Specimen Description BLOOD RIGHT HAND  Final   Special Requests   Final    BOTTLES DRAWN AEROBIC ONLY Blood Culture adequate volume   Culture   Final    NO GROWTH 3 DAYS Performed at [facility], [place] [address], [place], [place] [address]    Report Status PENDING  Incomplete  Urine culture     Status: Abnormal   Collection Time: 03/18/17  5:36 PM  Result Value Ref Range Status   Specimen Description URINE, CLEAN CATCH  Final   Special Requests NONE  Final   Culture MULTIPLE SPECIES PRESENT, SUGGEST RECOLLECTION (A)  Final   Report Status  03/20/2017 FINAL  Final  Culture, blood (Routine x 2)     Status: None (Preliminary result)   Collection Time: 03/18/17  6:18 PM  Result Value Ref Range Status   Specimen Description BLOOD LEFT FOREARM  Final   Special Requests   Final    BOTTLES DRAWN AEROBIC AND ANAEROBIC Blood Culture adequate volume   Culture   Final    NO GROWTH 3 DAYS Performed at [facility], [place] [address] [place], [place] [address]    Report Status PENDING  Incomplete  Respiratory Panel by PCR     Status: None   Collection Time: 03/19/17 12:10 AM  Result Value Ref Range Status   Adenovirus NOT DETECTED NOT DETECTED Final   Coronavirus 229E NOT DETECTED NOT DETECTED Final   Coronavirus HKU1 NOT DETECTED NOT DETECTED Final   Coronavirus NL63 NOT DETECTED NOT DETECTED Final  Coronavirus OC43 NOT DETECTED NOT DETECTED Final   Metapneumovirus NOT DETECTED NOT DETECTED Final   Rhinovirus / Enterovirus NOT DETECTED NOT DETECTED Final   Influenza A NOT DETECTED NOT DETECTED Final   Influenza B NOT DETECTED NOT DETECTED Final   Parainfluenza Virus 1 NOT DETECTED NOT DETECTED Final   Parainfluenza Virus 2 NOT DETECTED NOT DETECTED Final   Parainfluenza Virus 3 NOT DETECTED NOT DETECTED Final   Parainfluenza Virus 4 NOT DETECTED NOT DETECTED Final   Respiratory Syncytial Virus NOT DETECTED NOT DETECTED Final   Bordetella pertussis NOT DETECTED NOT DETECTED Final   Chlamydophila pneumoniae NOT DETECTED NOT DETECTED Final   Mycoplasma pneumoniae NOT DETECTED NOT DETECTED Final    Comment: Performed at [facility], [place] [address] [place], [place] [address]         Radiology Studies: No results found.      Scheduled Meds: . aspirin EC  81 mg Oral Daily  . enoxaparin (LOVENOX) injection  40 mg Subcutaneous QHS  . guaiFENesin  600 mg Oral BID  . levofloxacin  750 mg Oral Q1500  . pantoprazole  40 mg Oral Daily   Continuous Infusions: . sodium chloride 100 mL/hr (03/22/17 0107)      LOS: 4 days    Time spent: 35 minutes.     Hosie Poisson, MD [facility] Pager [phone]  If 7PM-7AM, please contact night-coverage [email or web address] Password [email or web address] 03/22/2017, 1:32 PM

## 2017-03-23 LAB — CULTURE, BLOOD (ROUTINE X 2)
CULTURE: NO GROWTH
Culture: NO GROWTH
SPECIAL REQUESTS: ADEQUATE
SPECIAL REQUESTS: ADEQUATE

## 2017-03-23 MED ORDER — POLYVINYL ALCOHOL 1.4 % OP SOLN
1.0000 [drp] | OPHTHALMIC | Status: DC | PRN
  Administered 2017-03-23: 1 [drp] via OPHTHALMIC
  Filled 2017-03-23: qty 15

## 2017-03-23 NOTE — Progress Notes (Signed)
PROGRESS NOTE    David Armstrong  YHC:[id number] DOB: 1950-12-15 DOA: 03/18/2017 PCP: Vivi Barrack, MD  Brief Narrative: David Armstrong is a 66 y.o. male with medical history significant of nephrolithiasis, anxiety, DVT not currently on anticoagulation; who presents with complaints of feeling ill.  Reports having 5 kidney stones removed and stent placement 2 days ago with Dr. Karsten Ro of Urology.  Patient reports having generalized weakness fevers Reiger shortness of breath nausea.  He was admitted for further evaluation of the above.   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  On admission patient was febrile tachypneic tachycardic with elevated lactic acid, hypotensive, abnormal UA and abnormal chest x-ray showing possible left lower lobe pneumonia.  Patient was started on empiric antibiotics IV cefepime and azithromycin.  Blood cultures, sputum cultures, urine cultures sent.  Respiratory virus panel was negative.lactic acid normalized.    Urine cultures showed multiple bacteria, blood cultures have been negative so far. Sputum cultures not taken. Transitioned the antibiotics to oral Levaquin to complete the course.  Patient denies any other complaints.  He is stable to be discharged.     Presyncopal episode Possibly secondary to orthostatic hypotension. Negative orthostatics.    Nephrolithiasis, left-sided hydronephrosis, hematuria, pyelonephritis. Status post double-J stent placement by Dr. Karsten Ro on 12 /3 Of follow-up with urology as recommended. No change.   History of PE/DVT back in 2017. Not on anticoagulation at this time. Asymptomatic.   Anemia : hemoglobin around 11. Stable .    Hyponatremia resolved.   DVT prophylaxis: Lovenox Code Status:  Full code Family Communication: None at bedside. Disposition Plan: Home tomorrow  If transportation can be provided.    Consultants:   Urology   Procedures: None   Antimicrobials: Completed 6 days of antibiotic   Subjective: Feels little bit weak but no new complaints. Objective: Vitals:   03/22/17 0428 03/22/17 1300 03/22/17 2121 03/23/17 0430  BP: (!) 97/54 110/62 125/74 122/74  Pulse: 62 70 74 67  Resp: 18 18 18 18   Temp: 97.6 F (36.4 C) 98.5 F (36.9 C) 98 F (36.7 C) (!) 97.4 F (36.3 C)  TempSrc: Oral Oral Oral Oral  SpO2: 100% 100% 100% 97%  Weight:      Height:        Intake/Output Summary (Last 24 hours) at 03/23/2017 1832 Last data filed at 03/23/2017 1548 Gross per 24 hour  Intake 4066.67 ml  Output 1000 ml  Net 3066.67 ml   Filed Weights   03/18/17 1607 03/18/17 2223  Weight: 80.7 kg (178 lb) 81.9 kg (180 lb 8.9 oz)    Examination: No change in exam  General exam: Appears calm and comfortable appears uncomfortable.  Respiratory system: air entry fair, no wheezing or rhonchi.  Cardiovascular system: S1 & S2 heard RRR, no murmer or JVD.  Gastrointestinal system: Abdomen is soft NT ND BS+ Central nervous system: Alert and oriented. No focal neurological deficits. Extremities: Symmetric 5 x 5 power. Skin: No rashes, lesions or ulcers Psychiatry: Judgement and insight appear normal. Mood normal.     Data Reviewed: I have personally reviewed following labs and imaging studies  CBC: Recent Labs  Lab 03/18/17 1640 03/19/17 0356 03/21/17 1148  WBC 9.7 6.7 4.8  NEUTROABS 7.5  --  2.9  HGB 13.2 11.5* 11.5*  HCT 39.0 34.3* 33.9*  MCV 89.9 90.0 88.3  PLT 159 111* 233*   Basic Metabolic Panel: Recent Labs  Lab 03/18/17 1640 03/19/17 0356 03/21/17 1148  NA 132* 134* 137  K 3.6 3.7 3.5  CL 101 109 107  CO2 25 20* 24  GLUCOSE 97 91 86  BUN 16 12 8   CREATININE 1.01 0.85 0.71  CALCIUM 8.0* 7.3* 7.6*   GFR: Estimated Creatinine  Clearance: 99.7 mL/min (by C-G formula based on SCr of 0.71 mg/dL). Liver Function Tests: Recent Labs  Lab 03/18/17 1640  AST 24  ALT 17  ALKPHOS 82  BILITOT 1.1  PROT 6.0*  ALBUMIN 3.6   No results for input(s): LIPASE, AMYLASE in the last 168 hours. No results for input(s): AMMONIA in the last 168 hours. Coagulation Profile: Recent Labs  Lab 03/18/17 1640  INR 1.04   Cardiac Enzymes: Recent Labs  Lab 03/18/17 2046  TROPONINI <0.03   BNP (last 3 results) No results for input(s): PROBNP in the last 8760 hours. HbA1C: No results for input(s): HGBA1C in the last 72 hours. CBG: Recent Labs  Lab 03/18/17 1645  GLUCAP 84   Lipid Profile: No results for input(s): CHOL, HDL, LDLCALC, TRIG, CHOLHDL, LDLDIRECT in the last 72 hours. Thyroid Function Tests: No results for input(s): TSH, T4TOTAL, FREET4, T3FREE, THYROIDAB in the last 72 hours. Anemia Panel: No results for input(s): VITAMINB12, FOLATE, FERRITIN, TIBC, IRON, RETICCTPCT in the last 72 hours. Sepsis Labs: Recent Labs  Lab 03/18/17 1635 03/18/17 1639 03/18/17 1952 03/18/17 2002  PROCALCITON  --  0.22  --   --   LATICACIDVEN 2.65*  --  1.17 1.5    Recent Results (from the past 240 hour(s))  Culture, blood (Routine x 2)     Status: None   Collection Time: 03/18/17  4:25 PM  Result Value Ref Range Status   Specimen Description BLOOD RIGHT HAND  Final   Special Requests   Final    BOTTLES DRAWN AEROBIC ONLY Blood Culture adequate volume   Culture   Final    NO GROWTH 5 DAYS Performed at [facility], [place] [address], [place], [place] [address]    Report Status 03/23/2017 FINAL  Final  Urine culture     Status: Abnormal   Collection Time: 03/18/17  5:36 PM  Result Value Ref Range Status   Specimen Description URINE, CLEAN CATCH  Final   Special Requests NONE  Final   Culture MULTIPLE SPECIES PRESENT, SUGGEST RECOLLECTION (A)  Final   Report Status 03/20/2017 FINAL  Final  Culture, blood (Routine  x 2)     Status: None   Collection Time: 03/18/17  6:18 PM  Result Value Ref Range Status   Specimen Description BLOOD LEFT FOREARM  Final   Special Requests   Final    BOTTLES DRAWN AEROBIC AND ANAEROBIC Blood Culture adequate volume   Culture   Final    NO GROWTH 5 DAYS Performed at [facility], [place] [address], [place], [place] [address]    Report Status 03/23/2017 FINAL  Final  Respiratory Panel by PCR     Status: None   Collection Time: 03/19/17 12:10 AM  Result Value Ref Range Status   Adenovirus NOT DETECTED NOT DETECTED Final   Coronavirus 229E NOT DETECTED NOT DETECTED Final   Coronavirus HKU1 NOT DETECTED NOT DETECTED Final   Coronavirus NL63 NOT DETECTED NOT DETECTED Final   Coronavirus  OC43 NOT DETECTED NOT DETECTED Final   Metapneumovirus NOT DETECTED NOT DETECTED Final   Rhinovirus / Enterovirus NOT DETECTED NOT DETECTED Final   Influenza A NOT DETECTED NOT DETECTED Final   Influenza B NOT DETECTED NOT DETECTED Final   Parainfluenza Virus 1 NOT DETECTED NOT DETECTED Final   Parainfluenza Virus 2 NOT DETECTED NOT DETECTED Final   Parainfluenza Virus 3 NOT DETECTED NOT DETECTED Final   Parainfluenza Virus 4 NOT DETECTED NOT DETECTED Final   Respiratory Syncytial Virus NOT DETECTED NOT DETECTED Final   Bordetella pertussis NOT DETECTED NOT DETECTED Final   Chlamydophila pneumoniae NOT DETECTED NOT DETECTED Final   Mycoplasma pneumoniae NOT DETECTED NOT DETECTED Final    Comment: Performed at [facility], [place] [address] [place], [place] [address]         Radiology Studies: Dg Abd 1 View  Result Date: 03/22/2017 CLINICAL DATA:  Patient status post cystoscopy an ureteral stent placement 03/16/2017. EXAM: ABDOMEN - 1 VIEW COMPARISON:  CT abdomen and pelvis 03/18/2017. FINDINGS: Left double-J ureteral stent projects in good position. Punctate nonobstructing stones in the left kidney seen on the comparison exam are not visualized on this study. No evidence  of right urinary tract calculus is seen. Bowel gas pattern is unremarkable. IMPRESSION: Left double-J ureteral stent projects in good position. Small nonobstructing stones in the left kidney seen on CT are not visualized on this exam. No evidence of ureteral stone. Electronically Signed   By: Inge Rise M.D.   On: 03/22/2017 14:25        Scheduled Meds: . aspirin EC  81 mg Oral Daily  . enoxaparin (LOVENOX) injection  40 mg Subcutaneous QHS  . guaiFENesin  600 mg Oral BID  . pantoprazole  40 mg Oral Daily   Continuous Infusions: . sodium chloride 100 mL/hr at 03/23/17 1025     LOS: 5 days    Time spent: 35 minutes.     Hosie Poisson, MD [facility] Pager [phone]  If 7PM-7AM, please contact night-coverage [email or web address] Password [facility] 03/23/2017, 6:32 PM

## 2017-03-23 NOTE — Care Management Note (Signed)
Case Management Note  Patient Details  Name: David Armstrong MRN: [id number] Date of Birth: 09-05-1950  Subjective/Objective:  66 y/o m admitted w/Sepsis. From home. CSW notified of transportation needs.                  Action/Plan:d/c home.   Expected Discharge Date:  (unknown)               Expected Discharge Plan:  Home/Self Care  In-House Referral:  Clinical Social Work  Discharge planning Services  CM Consult  Post Acute Care Choice:    Choice offered to:     DME Arranged:    DME Agency:     HH Arranged:    HH Agency:     Status of Service:  In process, will continue to follow  If discussed at Long Length of Stay Meetings, dates discussed:    Additional Comments:  Dessa Phi, RN 03/23/2017, 11:11 AM

## 2017-03-24 MED ORDER — GUAIFENESIN ER 600 MG PO TB12: 600.0000 mg | ORAL_TABLET | Freq: Two times a day (BID) | ORAL | 0 refills | Status: DC

## 2017-03-24 NOTE — Care Management Note (Signed)
Case Management Note  Patient Details  Name: David Armstrong MRN: [id number] Date of Birth: 05-18-1950  Subjective/Objective: Pt admitted with Sepsis                     Action/Plan: Pt states he have transportation home.   Expected Discharge Date:  03/24/17               Expected Discharge Plan:  Home/Self Care  In-House Referral:  Clinical Social Work  Discharge planning Services  CM Consult  Post Acute Care Choice:    Choice offered to:     DME Arranged:    DME Agency:     HH Arranged:    HH Agency:     Status of Service:  Completed, signed off  If discussed at [employer], dates discussed:    Additional CommentsPurcell Mouton, RN 03/24/2017, 11:06 AM

## 2017-03-27 DIAGNOSIS — N201 Calculus of ureter: Secondary | ICD-10-CM | POA: Diagnosis not present

## 2017-03-28 NOTE — Discharge Summary (Signed)
Physician Discharge Summary  David Armstrong WNU:[id number] DOB: 26-Jun-1950 DOA: 03/18/2017  PCP: Vivi Barrack, MD  Admit date: 03/18/2017 Discharge date: 03/24/2017  Admitted From: Home.  Disposition:  Home.   Recommendations for Outpatient Follow-up:  1. Follow up with PCP in 1-2 weeks 2. Please obtain BMP/CBC in one week 3. Please follow up with urology as recommended.   Home Health:yes   Discharge Condition:stable.  CODE STATUS: full code.  Diet recommendation: regular diet.   Brief/Interim Summary: David Armstrong a 66 y.o.malewith medical history significant ofnephrolithiasis, anxiety, DVT not currently on anticoagulation; whopresents with complaints of feeling ill. Reports having 5 kidney stones removedand stent placement 2 days ago with Dr. Karsten Ro of Urology.  Patient reports having generalized weakness fevers Reiger shortness of breath nausea.  He was admitted for further evaluation of the above.    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  On admission patient was febrile tachypneic tachycardic with elevated lactic acid, hypotensive, abnormal UA and abnormal chest x-ray showing possible left lower lobe pneumonia.  Patient was started on empiric antibiotics IV cefepime and azithromycin.  Blood cultures, sputum cultures, urine cultures sent.  Respiratory virus panel was negative.lactic acid normalized.    Urine cultures showed multiple bacteria, blood cultures have been negative so far. Sputum cultures not taken. Transitioned the antibiotics to oral Levaquin to complete the course.  Patient denies any other complaints.  He is stable to be discharged.     Presyncopal episode Possibly secondary to orthostatic hypotension. Negative  orthostatics.    Nephrolithiasis, left-sided hydronephrosis, hematuria, pyelonephritis. Status post double-J stent placement by Dr. Karsten Ro on 12 /3 Out follow-up with urology as recommended.   History of PE/DVT back in 2017. Not on anticoagulation at this time. Asymptomatic.   Anemia : hemoglobin around 11. Stable .    Hyponatremia resolved.       Discharge Instructions  Discharge Instructions    Diet - low sodium heart healthy   Complete by:  As directed    Discharge instructions   Complete by:  As directed    Please follow up with Urology as recommended.     Allergies as of 03/24/2017      Reactions   Acyclovir And Related    Zocor [simvastatin] Other (See Comments)   Muscle ache/ pain   Dexilant [dexlansoprazole] Other (See Comments)   Muscle aches   Ivp Dye [iodinated Diagnostic Agents] Rash   Soma [carisoprodol] Other (See Comments)   Sores on arm   Sulfa Antibiotics Rash      Medication List    STOP taking these medications   HYDROcodone-acetaminophen 10-325 MG tablet Commonly known as:  NORCO     TAKE these medications   aspirin EC 81 MG tablet Take 81 mg by mouth daily.   desonide 0.05 % lotion Commonly known as:  DESOWEN Apply 1 application topically daily as needed (for skin).   fluticasone 0.05 % cream Commonly known as:  CUTIVATE apply to affected area twice a day if needed   fluticasone 50 MCG/ACT nasal spray Commonly known as:  FLONASE Place 2 sprays into both nostrils daily as needed for allergies or rhinitis.   guaiFENesin 600 MG 12 hr tablet Commonly known as:  MUCINEX Take 1 tablet (600 mg total)  by mouth 2 (two) times daily.   hyoscyamine 0.125 MG Tbdp disintergrating tablet Commonly known as:  ANASPAZ Place 1 tablet (0.125 mg total) every 6 (six) hours as needed under the tongue.   LORazepam 1 MG tablet Commonly known as:  ATIVAN Take 0.5-1 mg by mouth every 8 (eight) hours as needed for anxiety.   omeprazole 20  MG capsule Commonly known as:  PRILOSEC take 1 capsule by mouth once daily   phenazopyridine 200 MG tablet Commonly known as:  PYRIDIUM Take 1 tablet (200 mg total) by mouth 3 (three) times daily as needed for pain.   sildenafil 50 MG tablet Commonly known as:  VIAGRA Take 50 mg by mouth as needed for erectile dysfunction.      Follow-up Information    Vivi Barrack, MD. Schedule an appointment as soon as possible for a visit in 1 week(s).   Specialty:  Family Medicine Contact information: [place] [address] [phone]          Allergies  Allergen Reactions  . Acyclovir And Related   . Zocor [Simvastatin] Other (See Comments)    Muscle ache/ pain  . Dexilant [Dexlansoprazole] Other (See Comments)    Muscle aches  . Ivp Dye [Iodinated Diagnostic Agents] Rash  . Soma [Carisoprodol] Other (See Comments)    Sores on arm  . Sulfa Antibiotics Rash    Consultations:  Urology.    Procedures/Studies: Dg Chest 2 View  Result Date: 03/18/2017 CLINICAL DATA:  Fever and left flank pain and to date. Left ureteral stent placed 2 days ago. EXAM: CHEST  2 VIEW COMPARISON:  None. FINDINGS: Heart size normal. Mild pulmonary vascular congestion is present without frank edema. There no effusions. Asymmetric left lower lobe airspace disease is present. Visualized soft tissues and bony thorax are unremarkable. The top of the left double-J ureteral stent is noted. IMPRESSION: 1. Asymmetric left lower lobe airspace disease is concerning for pneumonia. 2. Low lung volumes without other focal airspace disease. Electronically Signed   By: San Morelle M.D.   On: 03/18/2017 17:56   Dg Abd 1 View  Result Date: 03/22/2017 CLINICAL DATA:  Patient status post cystoscopy an ureteral stent placement 03/16/2017. EXAM: ABDOMEN - 1 VIEW COMPARISON:  CT abdomen and pelvis 03/18/2017. FINDINGS: Left double-J ureteral stent projects in good position. Punctate nonobstructing  stones in the left kidney seen on the comparison exam are not visualized on this study. No evidence of right urinary tract calculus is seen. Bowel gas pattern is unremarkable. IMPRESSION: Left double-J ureteral stent projects in good position. Small nonobstructing stones in the left kidney seen on CT are not visualized on this exam. No evidence of ureteral stone. Electronically Signed   By: Inge Rise M.D.   On: 03/22/2017 14:25   Kub Day Of Procedure  Result Date: 03/16/2017 CLINICAL DATA:  Preoperative examination prior to procedure for left ureteral stone removal. EXAM: ABDOMEN - 1 VIEW COMPARISON:  CT scan of the abdomen and pelvis of January 26, 2017 FINDINGS: There is a large stone projecting over the proximal third of the left ureter at approximately the L3-4 disc level. This measures 9 mm in greatest dimension. There are smaller stones projecting over the mid and lower pole of the left kidney with the largest measuring 6 mm. No right-sided stones are observed. There are pelvic phleboliths. IMPRESSION: Large proximal left ureteral stone with smaller stones in the left kidney. Electronically Signed   By: David  Martinique M.D.  On: 03/16/2017 07:51   Ct Renal Stone Study  Result Date: 03/18/2017 CLINICAL DATA:  Flank pain and syncope. Recent double-J stent placement on the left EXAM: CT ABDOMEN AND PELVIS WITHOUT CONTRAST TECHNIQUE: Multidetector CT imaging of the abdomen and pelvis was performed following the standard protocol without oral or intravenous contrast material administration. COMPARISON:  January 16, 2017 FINDINGS: Lower chest: There is patchy atelectasis in both lower lobes. There is lower lobe bronchiectatic change bilaterally. There are foci of coronary artery calcification. There is a focal hiatal hernia. Hepatobiliary: No focal liver lesions are evident on this noncontrast enhanced study. Gallbladder wall is not appreciably thickened. There is no biliary duct dilatation. Pancreas:  There is no pancreatic mass or inflammatory focus. Spleen: Spleen measures 14.6 x 11.8 x 8.2 cm with a measured splenic volume of 706 cubic cm. No focal splenic lesions are evident on this noncontrast enhanced study. Adrenals/Urinary Tract: Adrenals appear unremarkable bilaterally. There is no renal mass on either side. Each kidney has an extrarenal pelvis. There is a double-J stent extending from the left renal pelvis to the bladder. There is slight hydronephrosis on the left. There is no appreciable hydronephrosis on the right. There is a 1 mm calculus in the upper pole left kidney. There is a 2 mm calculus in the mid left kidney. In the lower pole region, there is a 1 mm calculus with a nearby 3 mm calculus. No ureteral calculi are evident on this study. Urinary bladder is midline with wall thickness within normal limits. A small amount of air in the urinary bladder is probably due to instrumentation. Stomach/Bowel: There are scattered colonic diverticula without diverticulitis. No bowel wall or mesenteric thickening is evident. There is no appreciable bowel obstruction. No free air or portal venous air evident. Vascular/Lymphatic: Aorta is somewhat tortuous without aneurysm. There are foci of calcification in each proximal iliac artery. Major mesenteric vessels appear patent on this noncontrast enhanced study. No adenopathy is appreciable in the abdomen or pelvis. Reproductive: Prostate and seminal vesicles appear normal in size and contour. No pelvic mass evident. Other: Appendix appears normal. There is fat in each inguinal ring. No abscess or ascites evident in the abdomen or pelvis. Musculoskeletal: There are no blastic or lytic bone lesions. No intramuscular or abdominal wall lesions. IMPRESSION: 1. Slight hydronephrosis on the left. Double-J stent extends from the left renal pelvis to the bladder. Nonobstructing calculi noted in left kidney. No ureteral calculi noted on either side. No hydronephrosis on  the right. 2. Small amount of air in the urinary bladder is likely due to instrumentation. Has gas-forming infectious organism could present in this manner, correlation with urinalysis advised in this regard. 3.  Splenomegaly of uncertain etiology. 4.  No bowel obstruction.  No abscess.  Appendix appears normal. 5. There is a focal hiatal hernia. There is fat in each inguinal ring. 6.  Iliac artery atherosclerosis noted. 7. Bibasilar lung atelectasis with lower lobe bronchiectatic change bilaterally. Electronically Signed   By: Lowella Grip III M.D.   On: 03/18/2017 19:37     Subjective: No new complaints.   Discharge Exam: Vitals:   03/23/17 2115 03/24/17 0435  BP: (!) 133/92 126/82  Pulse: 75 65  Resp: 18 18  Temp: 98 F (36.7 C) 98.8 F (37.1 C)  SpO2: 100% 100%   Vitals:   03/22/17 2121 03/23/17 0430 03/23/17 2115 03/24/17 0435  BP: 125/74 122/74 (!) 133/92 126/82  Pulse: 74 67 75 65  Resp: 18 18 18  18  Temp: 98 F (36.7 C) (!) 97.4 F (36.3 C) 98 F (36.7 C) 98.8 F (37.1 C)  TempSrc: Oral Oral Oral Oral  SpO2: 100% 97% 100% 100%  Weight:      Height:        General: Pt is alert, awake, not in acute distress Cardiovascular: RRR, S1/S2 +, no rubs, no gallops Respiratory: CTA bilaterally, no wheezing, no rhonchi Abdominal: Soft, NT, ND, bowel sounds + Extremities: no edema, no cyanosis    The results of significant diagnostics from this hospitalization (including imaging, microbiology, ancillary and laboratory) are listed below for reference.     Microbiology: Recent Results (from the past 240 hour(s))  Respiratory Panel by PCR     Status: None   Collection Time: 03/19/17 12:10 AM  Result Value Ref Range Status   Adenovirus NOT DETECTED NOT DETECTED Final   Coronavirus 229E NOT DETECTED NOT DETECTED Final   Coronavirus HKU1 NOT DETECTED NOT DETECTED Final   Coronavirus NL63 NOT DETECTED NOT DETECTED Final   Coronavirus OC43 NOT DETECTED NOT DETECTED Final    Metapneumovirus NOT DETECTED NOT DETECTED Final   Rhinovirus / Enterovirus NOT DETECTED NOT DETECTED Final   Influenza A NOT DETECTED NOT DETECTED Final   Influenza B NOT DETECTED NOT DETECTED Final   Parainfluenza Virus 1 NOT DETECTED NOT DETECTED Final   Parainfluenza Virus 2 NOT DETECTED NOT DETECTED Final   Parainfluenza Virus 3 NOT DETECTED NOT DETECTED Final   Parainfluenza Virus 4 NOT DETECTED NOT DETECTED Final   Respiratory Syncytial Virus NOT DETECTED NOT DETECTED Final   Bordetella pertussis NOT DETECTED NOT DETECTED Final   Chlamydophila pneumoniae NOT DETECTED NOT DETECTED Final   Mycoplasma pneumoniae NOT DETECTED NOT DETECTED Final    Comment: Performed at [facility], [address], [place], [place] [address]     Labs: BNP (last 3 results) No results for input(s): BNP in the last 8760 hours. Basic Metabolic Panel: No results for input(s): NA, K, CL, CO2, GLUCOSE, BUN, CREATININE, CALCIUM, MG, PHOS in the last 168 hours. Liver Function Tests: No results for input(s): AST, ALT, ALKPHOS, BILITOT, PROT, ALBUMIN in the last 168 hours. No results for input(s): LIPASE, AMYLASE in the last 168 hours. No results for input(s): AMMONIA in the last 168 hours. CBC: No results for input(s): WBC, NEUTROABS, HGB, HCT, MCV, PLT in the last 168 hours. Cardiac Enzymes: No results for input(s): CKTOTAL, CKMB, CKMBINDEX, TROPONINI in the last 168 hours. BNP: Invalid input(s): POCBNP CBG: No results for input(s): GLUCAP in the last 168 hours. D-Dimer No results for input(s): DDIMER in the last 72 hours. Hgb A1c No results for input(s): HGBA1C in the last 72 hours. Lipid Profile No results for input(s): CHOL, HDL, LDLCALC, TRIG, CHOLHDL, LDLDIRECT in the last 72 hours. Thyroid function studies No results for input(s): TSH, T4TOTAL, T3FREE, THYROIDAB in the last 72 hours.  Invalid input(s): FREET3 Anemia work up No results for input(s): VITAMINB12, FOLATE, FERRITIN,  TIBC, IRON, RETICCTPCT in the last 72 hours. Urinalysis    Component Value Date/Time   COLORURINE AMBER (A) 03/18/2017 1736   APPEARANCEUR HAZY (A) 03/18/2017 1736   LABSPEC 1.013 03/18/2017 1736   PHURINE 8.0 03/18/2017 1736   GLUCOSEU NEGATIVE 03/18/2017 1736   GLUCOSEU NEGATIVE 11/11/2016 1536   HGBUR LARGE (A) 03/18/2017 1736   HGBUR negative 06/02/2008 1026   BILIRUBINUR NEGATIVE 03/18/2017 1736   BILIRUBINUR Negative 12/18/2016 0905   KETONESUR 5 (A) 03/18/2017 1736  PROTEINUR 100 (A) 03/18/2017 1736   UROBILINOGEN 0.2 12/18/2016 0905   UROBILINOGEN 0.2 11/11/2016 1536   NITRITE POSITIVE (A) 03/18/2017 1736   LEUKOCYTESUR TRACE (A) 03/18/2017 1736   Sepsis Labs Invalid input(s): PROCALCITONIN,  WBC,  LACTICIDVEN Microbiology Recent Results (from the past 240 hour(s))  Respiratory Panel by PCR     Status: None   Collection Time: 03/19/17 12:10 AM  Result Value Ref Range Status   Adenovirus NOT DETECTED NOT DETECTED Final   Coronavirus 229E NOT DETECTED NOT DETECTED Final   Coronavirus HKU1 NOT DETECTED NOT DETECTED Final   Coronavirus NL63 NOT DETECTED NOT DETECTED Final   Coronavirus OC43 NOT DETECTED NOT DETECTED Final   Metapneumovirus NOT DETECTED NOT DETECTED Final   Rhinovirus / Enterovirus NOT DETECTED NOT DETECTED Final   Influenza A NOT DETECTED NOT DETECTED Final   Influenza B NOT DETECTED NOT DETECTED Final   Parainfluenza Virus 1 NOT DETECTED NOT DETECTED Final   Parainfluenza Virus 2 NOT DETECTED NOT DETECTED Final   Parainfluenza Virus 3 NOT DETECTED NOT DETECTED Final   Parainfluenza Virus 4 NOT DETECTED NOT DETECTED Final   Respiratory Syncytial Virus NOT DETECTED NOT DETECTED Final   Bordetella pertussis NOT DETECTED NOT DETECTED Final   Chlamydophila pneumoniae NOT DETECTED NOT DETECTED Final   Mycoplasma pneumoniae NOT DETECTED NOT DETECTED Final    Comment: Performed at [facility], [place] [address] [place], [place] [address]     Time  coordinating discharge: Over 30 minutes  SIGNED:   Hosie Poisson, MD  [facility] 03/28/2017, 11:25 PM Pager   If 7PM-7AM, please contact night-coverage [email or web address] Password [email or web address]

## 2017-04-01 DIAGNOSIS — D485 Neoplasm of uncertain behavior of skin: Secondary | ICD-10-CM | POA: Diagnosis not present

## 2017-04-01 DIAGNOSIS — L820 Inflamed seborrheic keratosis: Secondary | ICD-10-CM | POA: Diagnosis not present

## 2017-04-02 ENCOUNTER — Encounter: Payer: Self-pay | Admitting: Family Medicine

## 2017-04-02 ENCOUNTER — Ambulatory Visit (INDEPENDENT_AMBULATORY_CARE_PROVIDER_SITE_OTHER): Payer: Medicare Other | Admitting: Family Medicine

## 2017-04-02 VITALS — BP 118/78 | HR 78 | Temp 97.4°F | Ht 71.5 in | Wt 176.2 lb

## 2017-04-02 DIAGNOSIS — G609 Hereditary and idiopathic neuropathy, unspecified: Secondary | ICD-10-CM

## 2017-04-02 DIAGNOSIS — R300 Dysuria: Secondary | ICD-10-CM | POA: Diagnosis not present

## 2017-04-02 DIAGNOSIS — D696 Thrombocytopenia, unspecified: Secondary | ICD-10-CM | POA: Diagnosis not present

## 2017-04-02 DIAGNOSIS — D649 Anemia, unspecified: Secondary | ICD-10-CM | POA: Diagnosis not present

## 2017-04-02 LAB — BASIC METABOLIC PANEL
BUN: 15 mg/dL (ref 6–23)
CHLORIDE: 105 meq/L (ref 96–112)
CO2: 28 mEq/L (ref 19–32)
Calcium: 8.9 mg/dL (ref 8.4–10.5)
Creatinine, Ser: 0.76 mg/dL (ref 0.40–1.50)
GFR: 109.05 mL/min (ref >60.00)
GLUCOSE: 84 mg/dL (ref 70–99)
POTASSIUM: 4 meq/L (ref 3.5–5.1)
Sodium: 138 mEq/L (ref 135–145)

## 2017-04-02 LAB — POCT URINALYSIS DIP (MANUAL ENTRY)
BILIRUBIN UA: NEGATIVE
BILIRUBIN UA: NEGATIVE mg/dL
Blood, UA: NEGATIVE
GLUCOSE UA: NEGATIVE mg/dL
Leukocytes, UA: NEGATIVE
Nitrite, UA: NEGATIVE
PH UA: 6 (ref 5.0–8.0)
Protein Ur, POC: NEGATIVE mg/dL
Spec Grav, UA: >=1.03 — AB (ref 1.010–1.025)
Urobilinogen, UA: 0.2 E.U./dL

## 2017-04-02 LAB — CBC
HEMATOCRIT: 42.3 % (ref 39.0–52.0)
HEMOGLOBIN: 14 g/dL (ref 13.0–17.0)
MCHC: 33 g/dL (ref 30.0–36.0)
MCV: 91.5 fl (ref 78.0–100.0)
PLATELETS: 263 10*3/uL (ref 150.0–400.0)
RBC: 4.62 Mil/uL (ref 4.22–5.81)
RDW: 13.4 % (ref 11.5–15.5)
WBC: 5.1 10*3/uL (ref 4.0–10.5)

## 2017-04-02 NOTE — Progress Notes (Signed)
Subjective:  David Armstrong is a 66 y.o. male who presents today with a chief complaint of hospital follow up for sepsis.   HPI:  Summary of recent hospitalization: Patient presented to the ED on 03/18/2017 with generalized weakness, fevers, rigors, shortness of breath, and nausea after having a urological procedure for stent placement for hydronephrosis and nephrolithiasis. In the ED, he was found to be septic.  He was started on empiric antibiotics including cefepime and azithromycin to treat for possible left lower lobe pneumonia. Patient was admitted for presumed sepsis secondary to pneumonia and/or pyelonephritis.  He was transitioned to oral antibiotics and finished a course of Levaquin while hospitalized.  He was discharged on 03/24/2017.   Dysuria, new issue Patient has noticed moderate amount of dysuria since being discharged from hospital.  Notes that he did have a urological procedure done and is not sure if this is due to irritation from this or if he is having a urinary tract infection.  No fevers.  No chills.  Has a little bit of left back pain.  No treatments tried.  He was given a prescription for Pyridium however has not used this yet.  No other obvious precipitating events.  No obvious alleviating or aggravating factors.  Neuropathy, established problem, stable Patient also concerned that he may be having worsening neuropathic symptoms in his arms and feet.  Anemia, established problem, stable Noted while he was hospitalized.  Hemoglobin at discharge was 11.5.  No overt signs of bleeding.  Hypocalcemia, new problem Also noted while he was hospitalized.  Calcium at discharge was 7.6.    ROS: Per HPI, otherwise a 14 point review of systems was performed and was negative  PMH:  The following were reviewed and entered/updated in epic: Past Medical History:  Diagnosis Date  . Anxiety   . Arthritis    "lower back; hips;' right knee"   . Bipolar disorder ([place])   .  Chronic constipation   . Depression   . Diverticulosis of colon   . Edema of both lower extremities    ANKLES  . Fatty liver   . GERD (gastroesophageal reflux disease)   . Hiatal hernia   . History of anal fissures   . History of deep vein thrombosis (DVT) of lower extremity 02/11/2016   right lower extremity distal and proximal extensive acute  . History of Helicobacter pylori infection 2012; 2009  . History of kidney stones   . History of pneumonia 01/2015   CAP  . 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 Hyperlipidemia   . IBS (irritable bowel syndrome)   . Left ureteral stone   . Nocturia   . Peripheral neuropathy    per pt due to back DDD  . Personal history of colonic polyps    HYPERPLASTIC POLYP  . Pulmonary nodule, right    incidental finding CT chest angio 02-11-2016 stable  . TMJ (temporomandibular joint syndrome)   . Varicosities of leg    Patient Active Problem List   Diagnosis Date Noted  . Pneumonia 03/18/2017  . Senile purpura ([place]) 03/12/2017  . Lumbar degenerative disc disease 07/31/2016  . Gross hematuria 03/17/2016  . History of pulmonary embolism and DVT (Oct 2017) 02/11/2016  . Nephrolithiasis 04/19/2014  . Peripheral neuropathy ([place]) 12/15/2013  . Thrombosed external hemorrhoid s/p I&D [id number] 06/09/2012  . 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 IBS (irritable bowel syndrome) 03/09/2012  . 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  GERD 10/26/2007  . Hyperlipidemia 11/23/2006  . Anxiety state 11/23/2006  . DEPRESSION 11/23/2006   Past Surgical History:  Procedure Laterality Date  . BELPHAROPTOSIS REPAIR Bilateral 01/2015  . CATARACT EXTRACTION W/ INTRAOCULAR LENS  IMPLANT, BILATERAL  04/2016  . COLONOSCOPY WITH ESOPHAGOGASTRODUODENOSCOPY (EGD)  last one 03-02-2013  . CYSTOSCOPY/RETROGRADE/URETEROSCOPY/STONE EXTRACTION WITH BASKET  2010  . CYSTOSCOPY/URETEROSCOPY/HOLMIUM LASER/STENT PLACEMENT Left 03/16/2017    Procedure: CYSTOSCOPY/RETROGRADE/URETEROSCOPY/HOLMIUM LASER/STENT PLACEMENT, STONE BASKET EXTRACTION AND STENT PLACEMENT;  Surgeon: [name], MD;  Location: [facility];  Service: Urology;  Laterality: Left;  . EXCISIONAL HEMORRHOIDECTOMY  2015 approx.  Marland Kitchen EXTRACORPOREAL SHOCK WAVE LITHOTRIPSY  1987; 1992  . FOOT NEUROMA SURGERY Bilateral left 09/2015; right 04/ 2017   morton' neuroma  . FOOT NEUROMA SURGERY Right 2004   morton's neuroma and removal heel spur  . HAMMER TOE SURGERY Left 2013   5th toe  . INCISION AND DRAINAGE FOOT Left 1992   "ball of foot; stepped on piece of hard wire & it went thru my shoe"  . KNEE ARTHROSCOPY Bilateral left x2 1997;  right 2004 & 2005  . NASAL FRACTURE SURGERY  1997  . PATELLA-FEMORAL ARTHROPLASTY Right 07-06-2003   dr Eddie Dibbles [facility]  . PLANTAR FASCIA RELEASE Bilateral 1987 & 1993  . RHINOPLASTY  2000  . SHOULDER ARTHROSCOPY Left 2012;  2013  . SHOULDER ARTHROSCOPY WITH DISTAL CLAVICLE RESECTION Right 07-31-2005   dr Percell Miller  [facility]   w/ Debridement labral and adhesions, acromioplasty, bursectomy, and CA ligament release  . TRANSTHORACIC ECHOCARDIOGRAM  02/12/2016   ef 97-98%, grade 1 diastolic dysfunction/  trivial TR    Family History  Problem Relation Age of Onset  . Heart disease Mother   . Stroke Mother   . Cirrhosis Father   . Heart disease Father   . Skin cancer Father   . Prostate cancer Maternal Grandfather   . Parkinson's disease Paternal Grandfather   . Heart disease Brother   . Colon cancer Neg Hx   . Stomach cancer Neg Hx     Medications- reviewed and updated Current Outpatient Medications  Medication Sig Dispense Refill  . aspirin EC 81 MG tablet Take 81 mg by mouth daily.    Marland Kitchen desonide (DESOWEN) 0.05 % lotion Apply 1 application topically daily as needed (for skin).    . fluticasone (CUTIVATE) 0.05 % cream apply to affected area twice a day if needed  0  . fluticasone (FLONASE) 50 MCG/ACT nasal spray Place 2  sprays into both nostrils daily as needed for allergies or rhinitis.    Marland Kitchen guaiFENesin (MUCINEX) 600 MG 12 hr tablet Take 1 tablet (600 mg total) by mouth 2 (two) times daily. 20 tablet 0  . hyoscyamine (ANASPAZ) 0.125 MG TBDP disintergrating tablet Place 1 tablet (0.125 mg total) every 6 (six) hours as needed under the tongue. 90 tablet 3  . LORazepam (ATIVAN) 1 MG tablet Take 0.5-1 mg by mouth every 8 (eight) hours as needed for anxiety.     Marland Kitchen omeprazole (PRILOSEC) 20 MG capsule take 1 capsule by mouth once daily 30 capsule 3  . phenazopyridine (PYRIDIUM) 200 MG tablet Take 1 tablet (200 mg total) by mouth 3 (three) times daily as needed for pain. 20 tablet 0  . sildenafil (VIAGRA) 50 MG tablet Take 50 mg by mouth as needed for erectile dysfunction.   0   No current facility-administered medications for this visit.     Allergies-reviewed and updated Allergies  Allergen Reactions  .  Acyclovir And Related   . Zocor [Simvastatin] Other (See Comments)    Muscle ache/ pain  . Dexilant [Dexlansoprazole] Other (See Comments)    Muscle aches  . Ivp Dye [Iodinated Diagnostic Agents] Rash  . Soma [Carisoprodol] Other (See Comments)    Sores on arm  . Sulfa Antibiotics Rash    Social History   Socioeconomic History  . Marital status: Divorced    Spouse name: None  . Number of children: 0  . Years of education: None  . Highest education level: None  Social Needs  . Financial resource strain: None  . Food insecurity - worry: None  . Food insecurity - inability: None  . Transportation needs - medical: None  . Transportation needs - non-medical: None  Occupational History  . Occupation: retired    Fish farm manager: [employer]    Comment: disabled now  Tobacco Use  . Smoking status: Never Smoker  . Smokeless tobacco: Never Used  Substance and Sexual Activity  . Alcohol use: No    Alcohol/week: 0.0 oz  . Drug use: No  . Sexual activity: No  Other Topics Concern  . None  Social  History Narrative   Goes by "[name]"   Divorced   No children   Retired from the post office   Has internet girlfriend on facebook from [place]     Objective:  Physical Exam: BP 118/78 (BP Location: Left Arm, Patient Position: Sitting, Cuff Size: Normal)   Pulse 78   Temp (!) 97.4 F (36.3 C) (Oral)   Ht 5' 11.5" (1.816 m)   Wt 176 lb 3.2 oz (79.9 kg)   SpO2 96%   BMI 24.23 kg/m   Gen: NAD, resting comfortably CV: RRR with no murmurs appreciated Pulm: NWOB, CTAB with no crackles, wheezes, or rhonchi GI: Normal bowel sounds present. Soft, Nontender, Nondistended. MSK: Mild left CVA tenderness.  Summary/review of labs and imaging while hospitalized: CBC 03/19/2017: WBC 6.7, hemoglobin 11.5, hematocrit 34.3, platelets 111 BMET 03/19/2017: Sodium 134, potassium 3.7, chloride 109, CO2 20, glucose 91, BUN 12, creatinine 0.85, calcium 7.3  DG chest 03/18/2017: Left lower lobe airspace disease possibly consistent with pneumonia  Urine culture 03/18/2017: Multiple species present.   Assessment/Plan:  Dysuria Check UA and urine culture to rule out infection.  Symptoms most likely secondary to recent urological procedure with urethral irritation.  Advised continued use of Pyridium to see if this helps alleviate his symptoms.  Return precautions reviewed.  Follow-up as needed.  Anemia/thrombocytopenia. Check CBC today  Hypocalcemia Check BMET today.  Neuropathy Check B12-patient is at increased risk due to his chronic use of PPI.  [name]. [name], MD 04/02/2017 10:12 AM

## 2017-04-02 NOTE — Patient Instructions (Signed)
Start the pyridium.  We will check your urine and blood work today.  Come back in a few weeks for your annual wellness visit.  Take care,  Dr [name]

## 2017-04-03 LAB — URINE CULTURE
MICRO NUMBER:: 81434115
Result:: NO GROWTH
SPECIMEN QUALITY:: ADEQUATE

## 2017-04-06 NOTE — Progress Notes (Signed)
Urine culture negative. Blood work is normal. Please inform patient.  [name]. [name], MD 04/06/2017 8:20 AM

## 2017-04-08 DIAGNOSIS — M9904 Segmental and somatic dysfunction of sacral region: Secondary | ICD-10-CM | POA: Diagnosis not present

## 2017-04-08 DIAGNOSIS — M5431 Sciatica, right side: Secondary | ICD-10-CM | POA: Diagnosis not present

## 2017-04-08 DIAGNOSIS — M9905 Segmental and somatic dysfunction of pelvic region: Secondary | ICD-10-CM | POA: Diagnosis not present

## 2017-04-08 DIAGNOSIS — M9903 Segmental and somatic dysfunction of lumbar region: Secondary | ICD-10-CM | POA: Diagnosis not present

## 2017-04-09 ENCOUNTER — Telehealth: Payer: Self-pay | Admitting: Family Medicine

## 2017-04-09 DIAGNOSIS — M9903 Segmental and somatic dysfunction of lumbar region: Secondary | ICD-10-CM | POA: Diagnosis not present

## 2017-04-09 DIAGNOSIS — M9905 Segmental and somatic dysfunction of pelvic region: Secondary | ICD-10-CM | POA: Diagnosis not present

## 2017-04-09 DIAGNOSIS — M9904 Segmental and somatic dysfunction of sacral region: Secondary | ICD-10-CM | POA: Diagnosis not present

## 2017-04-09 DIAGNOSIS — M5431 Sciatica, right side: Secondary | ICD-10-CM | POA: Diagnosis not present

## 2017-04-09 NOTE — Telephone Encounter (Signed)
Copied from [place] [id number]. Topic: Quick Communication - See Telephone Encounter >> Apr 09, 2017  9:53 AM Cleaster Corin, NT wrote: CRM for notification. See Telephone encounter for:   04/09/17.pt. Calling to see if he can get a rx. Called in for predisone he is having elbow and arm pain again and wants to see if Dr. [name] can just call the rx. In for him without being seen for the same issue. Pt. Can be reached at [facility] [address], [address] [place] [address] Phone: [phone] Fax: [phone]

## 2017-04-09 NOTE — Telephone Encounter (Signed)
Please advise.  Patient having same symptoms with his arm.  Would like to know if you will call in prednisone or if he must be seen.

## 2017-04-09 NOTE — Telephone Encounter (Signed)
We can send in 5 day course of prednisone 50mg  daily. If not improving, will need office appointment. This is not something he should be on chronically or recurrently.   David Armstrong. [name], MD 04/09/2017 5:39 PM

## 2017-04-09 NOTE — Telephone Encounter (Signed)
See note

## 2017-04-10 ENCOUNTER — Other Ambulatory Visit: Payer: Self-pay

## 2017-04-10 MED ORDER — PREDNISONE 50 MG PO TABS: ORAL_TABLET | ORAL | 0 refills | Status: DC

## 2017-04-10 NOTE — Telephone Encounter (Signed)
Rx sent to pharmacy. Patient notified. 

## 2017-04-13 DIAGNOSIS — M9905 Segmental and somatic dysfunction of pelvic region: Secondary | ICD-10-CM | POA: Diagnosis not present

## 2017-04-13 DIAGNOSIS — M9904 Segmental and somatic dysfunction of sacral region: Secondary | ICD-10-CM | POA: Diagnosis not present

## 2017-04-13 DIAGNOSIS — M9903 Segmental and somatic dysfunction of lumbar region: Secondary | ICD-10-CM | POA: Diagnosis not present

## 2017-04-13 DIAGNOSIS — M5431 Sciatica, right side: Secondary | ICD-10-CM | POA: Diagnosis not present

## 2017-04-20 DIAGNOSIS — M9904 Segmental and somatic dysfunction of sacral region: Secondary | ICD-10-CM | POA: Diagnosis not present

## 2017-04-20 DIAGNOSIS — M9903 Segmental and somatic dysfunction of lumbar region: Secondary | ICD-10-CM | POA: Diagnosis not present

## 2017-04-20 DIAGNOSIS — M5431 Sciatica, right side: Secondary | ICD-10-CM | POA: Diagnosis not present

## 2017-04-20 DIAGNOSIS — M9905 Segmental and somatic dysfunction of pelvic region: Secondary | ICD-10-CM | POA: Diagnosis not present

## 2017-04-22 DIAGNOSIS — R270 Ataxia, unspecified: Secondary | ICD-10-CM | POA: Diagnosis not present

## 2017-04-22 DIAGNOSIS — M79601 Pain in right arm: Secondary | ICD-10-CM | POA: Diagnosis not present

## 2017-04-22 DIAGNOSIS — M5417 Radiculopathy, lumbosacral region: Secondary | ICD-10-CM | POA: Diagnosis not present

## 2017-04-22 DIAGNOSIS — M5481 Occipital neuralgia: Secondary | ICD-10-CM | POA: Diagnosis not present

## 2017-04-23 ENCOUNTER — Encounter: Payer: Self-pay | Admitting: Family Medicine

## 2017-04-23 ENCOUNTER — Ambulatory Visit (INDEPENDENT_AMBULATORY_CARE_PROVIDER_SITE_OTHER): Payer: Medicare Other | Admitting: Family Medicine

## 2017-04-23 VITALS — BP 120/74 | HR 89 | Temp 97.9°F | Ht 71.5 in | Wt 178.0 lb

## 2017-04-23 DIAGNOSIS — N1330 Unspecified hydronephrosis: Secondary | ICD-10-CM

## 2017-04-23 DIAGNOSIS — R109 Unspecified abdominal pain: Secondary | ICD-10-CM

## 2017-04-23 DIAGNOSIS — J309 Allergic rhinitis, unspecified: Secondary | ICD-10-CM | POA: Diagnosis not present

## 2017-04-23 DIAGNOSIS — F411 Generalized anxiety disorder: Secondary | ICD-10-CM

## 2017-04-23 DIAGNOSIS — F320 Major depressive disorder, single episode, mild: Secondary | ICD-10-CM

## 2017-04-23 MED ORDER — VENLAFAXINE HCL ER 37.5 MG PO CP24: ORAL_CAPSULE | ORAL | 0 refills | Status: DC

## 2017-04-23 MED ORDER — FLUTICASONE PROPIONATE 50 MCG/ACT NA SUSP: 2.0000 | Freq: Every day | NASAL | 11 refills | Status: DC | PRN

## 2017-04-23 NOTE — Patient Instructions (Signed)
Start the effexor.  Start the flonase.  We need to get an ultrasound of your kidney.  Come back to see me in 1 month, or sooner as needed.  Take care, Dr [name]

## 2017-04-23 NOTE — Assessment & Plan Note (Signed)
No SI or HI.  Currently is prescribed Ativan by his psychiatrist.  He would like to start Effexor again as this has worked well in the past.  Will start low-dose.  Sent in a prescription for 37.5 mg daily.  Instructed patient to increase to 75 mg daily after a week if he did well without side effects.  He will come back to see me in approximately 1 month for follow-up.  We will titrate slowly as tolerated.

## 2017-04-23 NOTE — Assessment & Plan Note (Signed)
No signs of infection.  Restart Flonase.

## 2017-04-23 NOTE — Assessment & Plan Note (Signed)
See anxiety A/P.  Start Effexor 37.5 mg daily.  Increase to 75 mg daily in 1 week.  He will follow-up with me in 4 weeks.

## 2017-04-23 NOTE — Progress Notes (Signed)
Subjective:  David Armstrong is a 67 y.o. male who presents today with a chief complaint of left flank pain.   HPI:   Left Flank Pain, Acute Issue Symptoms started a few weeks ago. Worsened over that time. He has a history of recurrent kidney stones.  Patient was hospitalized last month with sepsis secondary to UTI in setting of recent stent placement for hydronephrosis and nephrolithiasis.  Patient reports that the stent was removed about a month ago after he was discharged from the hospital.  Symptoms feel like his last kidney stone.  Pain feels more like a mild ache.  No fevers or chills.  No dysuria.  No hematuria.  Has an appointment scheduled with urologist in several weeks.  He has not tried any home treatments.  Nasal congestion, new issue Patient with history of allergic rhinitis.  He has been on Flonase in the past however has recently stopped taking this.  No obvious precipitating events.  Symptoms mostly confined to the right side of his face.  No home treatments tried.  No ear pain.  No sore throat.  No cough or sneeze.  Depression/anxiety, new problem this provider Several year history.  Patient has previously been treated by his psychiatrist.  Currently only on Ativan.  He has been on several medications in the past including SSRIs and SSRIs.  Does not remember the name of all of his medications however notes that he was on Effexor at one point which seemed to help both with his mood as well as his peripheral neuropathy.  He would like to go back on Effexor today.  ROS: Per HPI, otherwise 10 point review of systems was negative.   [place]: PMH significant for nephrolithiasis.  He reports that  has never smoked. he has never used smokeless tobacco. He reports that he does not drink alcohol or use drugs.  Objective:  Physical Exam: BP 120/74 (BP Location: Left Arm, Patient Position: Sitting, Cuff Size: Normal)   Pulse 89   Temp 97.9 F (36.6 C) (Oral)   Ht 5' 11.5" (1.816 m)    Wt 178 lb (80.7 kg)   SpO2 96%   BMI 24.48 kg/m   Gen: NAD, resting comfortably HEENT: TMs clear.  Nasal mucosa erythematous with clear nasal discharge.  Maxillary sinuses with decreased transillumination bilaterally.  Oropharynx clear.  No lymphadenopathy. CV: RRR with no murmurs appreciated Pulm: NWOB, CTAB with no crackles, wheezes, or rhonchi MSK: Mild left CVA tenderness.  Assessment/Plan:  Left flank pain Given patient's recent urological procedures, he has increased risk for obstruction or recurrent nephrolithiasis.  We will check a stat ultrasound to rule this out.  He did have mild hydronephrosis on his left kidney on a CT scan from about a month ago.  He does not have any signs of infection today.  Advised patient to closely follow-up with his urologist.  Allergic rhinitis No signs of infection.  Restart Flonase.  Anxiety state No SI or HI.  Currently is prescribed Ativan by his psychiatrist.  He would like to start Effexor again as this has worked well in the past.  Will start low-dose.  Sent in a prescription for 37.5 mg daily.  Instructed patient to increase to 75 mg daily after a week if he did well without side effects.  He will come back to see me in approximately 1 month for follow-up.  We will titrate slowly as tolerated.  Depression, major, single episode, mild ([place]) See anxiety A/P.  Start Effexor  37.5 mg daily.  Increase to 75 mg daily in 1 week.  He will follow-up with me in 4 weeks.  Time Spent: I spent 43 minutes face-to-face with the patient, with more than half spent on counseling for diagnostic possibilities for his flank pain, treatment for his depression and anxiety, and treatment for his allergic rhinitis.   [name]. [name], MD 04/23/2017 1:56 PM

## 2017-04-24 ENCOUNTER — Ambulatory Visit (HOSPITAL_COMMUNITY)
Admission: RE | Admit: 2017-04-24 | Discharge: 2017-04-24 | Disposition: A | Discharge status: Home / Self Care | Payer: Medicare Other | Source: Ambulatory Visit | Attending: Family Medicine | Admitting: Family Medicine

## 2017-04-24 DIAGNOSIS — R109 Unspecified abdominal pain: Secondary | ICD-10-CM | POA: Insufficient documentation

## 2017-04-24 DIAGNOSIS — N200 Calculus of kidney: Secondary | ICD-10-CM | POA: Diagnosis not present

## 2017-04-24 DIAGNOSIS — N1330 Unspecified hydronephrosis: Secondary | ICD-10-CM

## 2017-04-24 NOTE — Progress Notes (Signed)
Patient's ultrasound does not show any signs of obstructing stone or signs of urinary blockage.  His flank pain is most likely not coming from his kidney.  It is possible he had a muscular strain related to his recent massage.  He should continue using a heating pad.  He can also try using Tylenol.  If his symptoms worsen or are not improving in the next 5-7 days, he should come back in for another office visit.

## 2017-04-30 DIAGNOSIS — M7701 Medial epicondylitis, right elbow: Secondary | ICD-10-CM | POA: Insufficient documentation

## 2017-04-30 DIAGNOSIS — M25521 Pain in right elbow: Secondary | ICD-10-CM | POA: Diagnosis not present

## 2017-05-05 ENCOUNTER — Ambulatory Visit (INDEPENDENT_AMBULATORY_CARE_PROVIDER_SITE_OTHER): Payer: Medicare Other | Admitting: *Deleted

## 2017-05-05 ENCOUNTER — Encounter: Payer: Self-pay | Admitting: *Deleted

## 2017-05-05 VITALS — BP 122/78 | HR 72 | Resp 16 | Ht 72.0 in | Wt 175.0 lb

## 2017-05-05 DIAGNOSIS — Z0000 Encounter for general adult medical examination without abnormal findings: Secondary | ICD-10-CM

## 2017-05-05 NOTE — Patient Instructions (Addendum)
Mr. David Armstrong , Thank you for taking time to come for your Medicare Wellness Visit. I appreciate your ongoing commitment to your health goals. Please review the following plan we discussed and let me know if I can assist you in the future.   These are the goals we discussed: Goals    . Avoid kidney stones. Maintain current health status. (pt-stated)       This is a list of the screening recommended for you and due dates:  Health Maintenance  Topic Date Due  . Pneumonia vaccines (1 of 2 - PCV13) 03/20/2016  . Tetanus Vaccine  11/30/2022  . Colon Cancer Screening  03/03/2023  . Flu Shot  Completed  .  Hepatitis C: One time screening is recommended by Center for Disease Control  (CDC) for  adults born from [birth year] through 1965.   Completed   Preventive Care for Adults  A healthy lifestyle and preventive care can promote health and wellness. Preventive health guidelines for adults include the following key practices.  . A routine yearly physical is a good way to check with your health care provider about your health and preventive screening. It is a chance to share any concerns and updates on your health and to receive a thorough exam.  . Visit your dentist for a routine exam and preventive care every 6 months. Brush your teeth twice a day and floss once a day. Good oral hygiene prevents tooth decay and gum disease.  . The frequency of eye exams is based on your age, health, family medical history, use  of contact lenses, and other factors. Follow your health care provider's recommendations for frequency of eye exams.  . Eat a healthy diet. Foods like vegetables, fruits, whole grains, low-fat dairy products, and lean protein foods contain the nutrients you need without too many calories. Decrease your intake of foods high in solid fats, added sugars, and salt. Eat the right amount of calories for you. Get information about a proper diet from your health care provider, if necessary.  . Regular  physical exercise is one of the most important things you can do for your health. Most adults should get at least 150 minutes of moderate-intensity exercise (any activity that increases your heart rate and causes you to sweat) each week. In addition, most adults need muscle-strengthening exercises on 2 or more days a week.  Silver Sneakers may be a benefit available to you. To determine eligibility, you may visit the website: www.silversneakers.com or contact program at (218)479-8825 Mon-Fri between 8AM-8PM.   . Maintain a healthy weight. The body mass index (BMI) is a screening tool to identify possible weight problems. It provides an estimate of body fat based on height and weight. Your health care provider can find your BMI and can help you achieve or maintain a healthy weight.   For adults 20 years and older: ? A BMI below 18.5 is considered underweight. ? A BMI of 18.5 to 24.9 is normal. ? A BMI of 25 to 29.9 is considered overweight. ? A BMI of 30 and above is considered obese.   . Maintain normal blood lipids and cholesterol levels by exercising and minimizing your intake of saturated fat. Eat a balanced diet with plenty of fruit and vegetables. Blood tests for lipids and cholesterol should begin at age 69 and be repeated every 5 years. If your lipid or cholesterol levels are high, you are over 50, or you are at high risk for heart disease, you may need  your cholesterol levels checked more frequently. Ongoing high lipid and cholesterol levels should be treated with medicines if diet and exercise are not working.  . If you smoke, find out from your health care provider how to quit. If you do not use tobacco, please do not start.  . If you choose to drink alcohol, please do not consume more than 2 drinks per day. One drink is considered to be 12 ounces (355 mL) of beer, 5 ounces (148 mL) of wine, or 1.5 ounces (44 mL) of liquor.  . If you are 57-56 years old, ask your health care provider if  you should take aspirin to prevent strokes.  . Use sunscreen. Apply sunscreen liberally and repeatedly throughout the day. You should seek shade when your shadow is shorter than you. Protect yourself by wearing long sleeves, pants, a wide-brimmed hat, and sunglasses year round, whenever you are outdoors.  . Once a month, do a whole body skin exam, using a mirror to look at the skin on your back. Tell your health care provider of new moles, moles that have irregular borders, moles that are larger than a pencil eraser, or moles that have changed in shape or color.

## 2017-05-05 NOTE — Progress Notes (Signed)
I have personally reviewed the Medicare Annual Wellness Visit and agree with the assessment and plan.  [name]. [name], MD 05/05/2017 11:39 AM

## 2017-05-05 NOTE — Progress Notes (Signed)
Subjective:   David Armstrong is a 67 y.o. male who presents for Medicare Annual/Subsequent preventive examination.  Lives alone in two story single family home.   Review of Systems:  No ROS.  Medicare Wellness Visit. Additional risk factors are reflected in the social history.  Sleeps 6-8 hrs/night. Wakes up mostly feeling rested.  Cardiac Risk Factors include: advanced age (>67men, >70 women);male gender     Objective:    Vitals: BP 122/78 (BP Location: Left Arm, Patient Position: Sitting, Cuff Size: Normal)   Pulse 72   Resp 16   Ht 6' (1.829 m)   Wt 175 lb (79.4 kg)   SpO2 99%   BMI 23.73 kg/m   Body mass index is 23.73 kg/m.  Advanced Directives 05/05/2017 03/18/2017 03/16/2017 11/11/2016 08/06/2016 02/11/2016 01/14/2015  Does Patient Have a Medical Advance Directive? No No No No No No No  Would patient like information on creating a medical advance directive? Yes (MAU/Ambulatory/Procedural Areas - Information given) No - Patient declined No - Patient declined No - Patient declined No - Patient declined No - patient declined information No - patient declined information  Patient does not have advanced directives. I gave him a blank copy and thoroughly explained the documents. All questions were answered. Total time spent on this topic was 16 minutes.   Tobacco Social History   Tobacco Use  Smoking Status Never Smoker  Smokeless Tobacco Never Used     Counseling given: Not Answered  Past Medical History:  Diagnosis Date  . Anxiety   . Arthritis    "lower back; hips;' right knee"   . Bipolar disorder ([place])   . Chronic constipation   . Depression   . Diverticulosis of colon   . Edema of both lower extremities    ANKLES  . Fatty liver   . GERD (gastroesophageal reflux disease)   . Hiatal hernia   . History of anal fissures   . History of deep vein thrombosis (DVT) of lower extremity 02/11/2016   right lower extremity distal and proximal extensive acute  .  History of Helicobacter pylori infection 2012; 2009  . History of kidney stones   . History of pneumonia 01/2015   CAP  . 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 Hyperlipidemia   . IBS (irritable bowel syndrome)   . Left ureteral stone   . Nocturia   . Peripheral neuropathy    per pt due to back DDD  . Personal history of colonic polyps    HYPERPLASTIC POLYP  . Pulmonary nodule, right    incidental finding CT chest angio 02-11-2016 stable  . TMJ (temporomandibular joint syndrome)   . Varicosities of leg    Past Surgical History:  Procedure Laterality Date  . BELPHAROPTOSIS REPAIR Bilateral 01/2015  . CATARACT EXTRACTION W/ INTRAOCULAR LENS  IMPLANT, BILATERAL  04/2016  . COLONOSCOPY WITH ESOPHAGOGASTRODUODENOSCOPY (EGD)  last one 03-02-2013  . CYSTOSCOPY/RETROGRADE/URETEROSCOPY/STONE EXTRACTION WITH BASKET  2010  . CYSTOSCOPY/URETEROSCOPY/HOLMIUM LASER/STENT PLACEMENT Left 03/16/2017   Procedure: CYSTOSCOPY/RETROGRADE/URETEROSCOPY/HOLMIUM LASER/STENT PLACEMENT, STONE BASKET EXTRACTION AND STENT PLACEMENT;  Surgeon: [name], MD;  Location: [facility];  Service: Urology;  Laterality: Left;  . EXCISIONAL HEMORRHOIDECTOMY  2015 approx.  Marland Kitchen EXTRACORPOREAL SHOCK WAVE LITHOTRIPSY  1987; 1992  . FOOT NEUROMA SURGERY Bilateral left 09/2015; right 04/ 2017   morton' neuroma  . FOOT NEUROMA SURGERY Right 2004   morton's neuroma and removal heel spur  . HAMMER TOE SURGERY  Left 2013   5th toe  . INCISION AND DRAINAGE FOOT Left 1992   "ball of foot; stepped on piece of hard wire & it went thru my shoe"  . KNEE ARTHROSCOPY Bilateral left x2 1997;  right 2004 & 2005  . NASAL FRACTURE SURGERY  1997  . PATELLA-FEMORAL ARTHROPLASTY Right 07-06-2003   dr Eddie Dibbles [facility]  . PLANTAR FASCIA RELEASE Bilateral 1987 & 1993  . RHINOPLASTY  2000  . SHOULDER ARTHROSCOPY Left 2012;  2013  . SHOULDER ARTHROSCOPY WITH DISTAL CLAVICLE RESECTION Right 07-31-2005    dr Percell Miller  [facility]   w/ Debridement labral and adhesions, acromioplasty, bursectomy, and CA ligament release  . TRANSTHORACIC ECHOCARDIOGRAM  02/12/2016   ef 78-24%, grade 1 diastolic dysfunction/  trivial TR   Family History  Problem Relation Age of Onset  . Heart disease Mother   . Stroke Mother   . Cirrhosis Father   . Heart disease Father   . Skin cancer Father   . Prostate cancer Maternal Grandfather   . Parkinson's disease Paternal Grandfather   . Heart disease Brother   . Colon cancer Neg Hx   . Stomach cancer Neg Hx    Social History   Socioeconomic History  . Marital status: Divorced    Spouse name: None  . Number of children: 0  . Years of education: None  . Highest education level: None  Social Needs  . Financial resource strain: None  . Food insecurity - worry: None  . Food insecurity - inability: None  . Transportation needs - medical: None  . Transportation needs - non-medical: None  Occupational History  . Occupation: retired    Fish farm manager: [employer]    Comment: disabled now  Tobacco Use  . Smoking status: Never Smoker  . Smokeless tobacco: Never Used  Substance and Sexual Activity  . Alcohol use: No    Alcohol/week: 0.0 oz  . Drug use: No  . Sexual activity: No  Other Topics Concern  . None  Social History Narrative   Goes by "[name]"   Divorced   No children   Retired from the post [employer]   Has internet girlfriend on facebook from [place]    Outpatient Encounter Medications as of 05/05/2017  Medication Sig  . aspirin EC 81 MG tablet Take 81 mg by mouth daily.  Marland Kitchen desonide (DESOWEN) 0.05 % lotion Apply 1 application topically daily as needed (for skin).  . fluticasone (CUTIVATE) 0.05 % cream apply to affected area twice a day if needed  . fluticasone (FLONASE) 50 MCG/ACT nasal spray Place 2 sprays into both nostrils daily as needed for allergies or rhinitis.  . hyoscyamine (ANASPAZ) 0.125 MG TBDP disintergrating tablet Place 1 tablet (0.125 mg  total) every 6 (six) hours as needed under the tongue.  Marland Kitchen LORazepam (ATIVAN) 1 MG tablet Take 0.5-1 mg by mouth every 8 (eight) hours as needed for anxiety.   Marland Kitchen omeprazole (PRILOSEC) 20 MG capsule take 1 capsule by mouth once daily  . phenazopyridine (PYRIDIUM) 200 MG tablet Take 1 tablet (200 mg total) by mouth 3 (three) times daily as needed for pain.  . sildenafil (VIAGRA) 50 MG tablet Take 50 mg by mouth as needed for erectile dysfunction.   Marland Kitchen venlafaxine XR (EFFEXOR XR) 37.5 MG 24 hr capsule Take 1 capsule (37.5 mg total) by mouth daily with breakfast for 7 days, THEN 2 capsules (75 mg total) daily with breakfast for 21 days.  . [DISCONTINUED] guaiFENesin (MUCINEX) 600 MG  12 hr tablet Take 1 tablet (600 mg total) by mouth 2 (two) times daily. (Patient not taking: Reported on 05/05/2017)  . [DISCONTINUED] predniSONE (DELTASONE) 50 MG tablet Take 1 tablet daily for 5 days, then stop (Patient not taking: Reported on 05/05/2017)   No facility-administered encounter medications on file as of 05/05/2017.     Activities of Daily Living In your present state of health, do you have any difficulty performing the following activities: 05/05/2017 03/18/2017  Hearing? N N  Vision? N N  Difficulty concentrating or making decisions? N N  Walking or climbing stairs? N N  Dressing or bathing? N N  Doing errands, shopping? N Y  Conservation officer, nature and eating ? N -  Using the Toilet? N -  In the past six months, have you accidently leaked urine? N -  Do you have problems with loss of bowel control? N -  Managing your Medications? N -  Managing your Finances? N -  Housekeeping or managing your Housekeeping? N -  Some recent data might be hidden    Patient Care Team: Vivi Barrack, MD as PCP - General (Family Medicine) Michael Boston, MD as Consulting Physician (General Surgery) Sable Feil, MD as Consulting Physician (Gastroenterology) Willia Craze, NP as Nurse Practitioner  (Gastroenterology) [name], MD as Consulting Physician (Urology)   Assessment:   This is a routine wellness examination for David Armstrong.  Exercise Activities and Dietary recommendations Current Exercise Habits: Home exercise routine, Type of exercise: walking, Time (Minutes): 45, Frequency (Times/Week): 5, Weekly Exercise (Minutes/Week): 225, Intensity: Mild, Exercise limited by: None identified   Meals vary day to day. Sometimes snacks a lot. Mostly eats 2 meals a day with a snack. States he does not drink enough water. Drinks caffeine throughout day.  Goals    . Avoid kidney stones. Maintain current health status. (pt-stated)       Fall Risk Fall Risk  05/05/2017 11/11/2016  Falls in the past year? No No   Depression Screen PHQ 2/9 Scores 05/05/2017 11/11/2016  PHQ - 2 Score 2 1  PHQ- 9 Score 5 -  PHQ 9 score is 5. Patient states that he did not like the Effexor. States he will discuss this with Dr [name] at next visit. States that ativan has helped with his depression. Denies suicidal or homicidal thoughts.   Cognitive Function Ad8 score reviewed for issues:  Issues making decisions:no  Less interest in hobbies / activities:no  Repeats questions, stories (family complaining):no  Trouble using ordinary gadgets (microwave, computer, phone):no  Forgets the month or year: no  Mismanaging finances: no  Remembering appts:no  Daily problems with thinking and/or memory:no Ad8 score is=0 Patient states he does a lot of history research.         Immunization History  Administered Date(s) Administered  . Influenza Split 12/22/2012  . Influenza,inj,Quad PF,6+ Mos 01/07/2014  . Influenza-Unspecified 02/12/2015, 01/13/2016  . Tdap 11/29/2012  . Zoster 11/29/2012  . Zoster Recombinat (Shingrix) 07/13/2016, 09/12/2016   Screening Tests Health Maintenance  Topic Date Due  . PNA vac Low Risk Adult (1 of 2 - PCV13) 03/20/2016  . TETANUS/TDAP  11/30/2022  . COLONOSCOPY   03/03/2023  . INFLUENZA VACCINE  Completed  . Hepatitis C Screening  Completed      Plan:   Follow up with PCP as directed.   I have personally reviewed and noted the following in the patient's chart:   . Medical and social history . Use of alcohol,  tobacco or illicit drugs  . Current medications and supplements . Functional ability and status . Nutritional status . Physical activity . Advanced directives . List of other physicians . Vitals . Screenings to include cognitive, depression, and falls . Referrals and appointments  In addition, I have reviewed and discussed with patient certain preventive protocols, quality metrics, and best practice recommendations. A written personalized care plan for preventive services as well as general preventive health recommendations were provided to patient.     Williemae Area, RN  05/05/2017

## 2017-05-05 NOTE — Progress Notes (Signed)
PCP notes:   Health maintenance: PCV13: Received at [facility], called [facility] and got verification and updated Health Maintenance.    Abnormal screenings: PHQ9 score is 5. Denies any suicidal or homicidal thoughts. Stopped taking Effexor due to trouble sleeping. Patient will discuss this with Dr [name] at next office visit.   Patient concerns: None.   Nurse concerns: None.   Next PCP appt: 05/26/17

## 2017-05-05 NOTE — Progress Notes (Signed)
Pre visit review using our clinic review tool, if applicable. No additional management support is needed unless otherwise documented below in the visit note. 

## 2017-05-06 DIAGNOSIS — L723 Sebaceous cyst: Secondary | ICD-10-CM | POA: Diagnosis not present

## 2017-05-06 DIAGNOSIS — N200 Calculus of kidney: Secondary | ICD-10-CM | POA: Diagnosis not present

## 2017-05-14 DIAGNOSIS — H02132 Senile ectropion of right lower eyelid: Secondary | ICD-10-CM | POA: Diagnosis not present

## 2017-05-14 DIAGNOSIS — H02135 Senile ectropion of left lower eyelid: Secondary | ICD-10-CM | POA: Diagnosis not present

## 2017-05-14 DIAGNOSIS — H0102B Squamous blepharitis left eye, upper and lower eyelids: Secondary | ICD-10-CM | POA: Diagnosis not present

## 2017-05-14 DIAGNOSIS — H0011 Chalazion right upper eyelid: Secondary | ICD-10-CM | POA: Diagnosis not present

## 2017-05-14 DIAGNOSIS — H0102A Squamous blepharitis right eye, upper and lower eyelids: Secondary | ICD-10-CM | POA: Diagnosis not present

## 2017-05-14 DIAGNOSIS — H02422 Myogenic ptosis of left eyelid: Secondary | ICD-10-CM | POA: Diagnosis not present

## 2017-05-23 ENCOUNTER — Ambulatory Visit (HOSPITAL_COMMUNITY): Payer: Medicare Other | Attending: Student

## 2017-05-23 ENCOUNTER — Emergency Department (HOSPITAL_COMMUNITY): Payer: Medicare Other

## 2017-05-23 ENCOUNTER — Emergency Department (HOSPITAL_COMMUNITY)
Admission: EM | Admit: 2017-05-23 | Discharge: 2017-05-23 | Disposition: A | Discharge status: Home / Self Care | Payer: Medicare Other | Attending: Emergency Medicine | Admitting: Emergency Medicine

## 2017-05-23 DIAGNOSIS — S299XXA Unspecified injury of thorax, initial encounter: Secondary | ICD-10-CM | POA: Diagnosis not present

## 2017-05-23 DIAGNOSIS — R1030 Lower abdominal pain, unspecified: Secondary | ICD-10-CM | POA: Insufficient documentation

## 2017-05-23 DIAGNOSIS — M545 Low back pain: Secondary | ICD-10-CM | POA: Diagnosis not present

## 2017-05-23 DIAGNOSIS — S161XXA Strain of muscle, fascia and tendon at neck level, initial encounter: Secondary | ICD-10-CM | POA: Diagnosis not present

## 2017-05-23 DIAGNOSIS — Z79899 Other long term (current) drug therapy: Secondary | ICD-10-CM | POA: Diagnosis not present

## 2017-05-23 DIAGNOSIS — R0789 Other chest pain: Secondary | ICD-10-CM | POA: Diagnosis not present

## 2017-05-23 DIAGNOSIS — W2211XA Striking against or struck by driver side automobile airbag, initial encounter: Secondary | ICD-10-CM | POA: Diagnosis not present

## 2017-05-23 DIAGNOSIS — Y998 Other external cause status: Secondary | ICD-10-CM | POA: Diagnosis not present

## 2017-05-23 DIAGNOSIS — R079 Chest pain, unspecified: Secondary | ICD-10-CM | POA: Diagnosis not present

## 2017-05-23 DIAGNOSIS — M79605 Pain in left leg: Secondary | ICD-10-CM | POA: Diagnosis not present

## 2017-05-23 DIAGNOSIS — T148XXA Other injury of unspecified body region, initial encounter: Secondary | ICD-10-CM | POA: Diagnosis not present

## 2017-05-23 DIAGNOSIS — Y92410 Unspecified street and highway as the place of occurrence of the external cause: Secondary | ICD-10-CM | POA: Diagnosis not present

## 2017-05-23 DIAGNOSIS — S199XXA Unspecified injury of neck, initial encounter: Secondary | ICD-10-CM | POA: Diagnosis not present

## 2017-05-23 DIAGNOSIS — R51 Headache: Secondary | ICD-10-CM | POA: Diagnosis not present

## 2017-05-23 DIAGNOSIS — S8992XA Unspecified injury of left lower leg, initial encounter: Secondary | ICD-10-CM | POA: Diagnosis not present

## 2017-05-23 DIAGNOSIS — M542 Cervicalgia: Secondary | ICD-10-CM | POA: Diagnosis not present

## 2017-05-23 DIAGNOSIS — S3991XA Unspecified injury of abdomen, initial encounter: Secondary | ICD-10-CM | POA: Diagnosis not present

## 2017-05-23 DIAGNOSIS — Y9389 Activity, other specified: Secondary | ICD-10-CM | POA: Diagnosis not present

## 2017-05-23 DIAGNOSIS — Z7982 Long term (current) use of aspirin: Secondary | ICD-10-CM | POA: Diagnosis not present

## 2017-05-23 DIAGNOSIS — S0990XA Unspecified injury of head, initial encounter: Secondary | ICD-10-CM | POA: Diagnosis not present

## 2017-05-23 DIAGNOSIS — M79606 Pain in leg, unspecified: Secondary | ICD-10-CM | POA: Diagnosis not present

## 2017-05-23 DIAGNOSIS — Z96651 Presence of right artificial knee joint: Secondary | ICD-10-CM | POA: Insufficient documentation

## 2017-05-23 DIAGNOSIS — R109 Unspecified abdominal pain: Secondary | ICD-10-CM | POA: Diagnosis not present

## 2017-05-23 DIAGNOSIS — M79662 Pain in left lower leg: Secondary | ICD-10-CM | POA: Diagnosis not present

## 2017-05-23 MED ORDER — HYDROCORTISONE NA SUCCINATE PF 250 MG IJ SOLR
200.0000 mg | Freq: Once | INTRAMUSCULAR | Status: DC
  Filled 2017-05-23: qty 200

## 2017-05-23 MED ORDER — TRAMADOL HCL 50 MG PO TABS: 50.0000 mg | ORAL_TABLET | Freq: Four times a day (QID) | ORAL | 0 refills | Status: DC | PRN

## 2017-05-23 MED ORDER — DIPHENHYDRAMINE HCL 25 MG PO CAPS: 50.0000 mg | ORAL_CAPSULE | Freq: Once | ORAL | Status: DC

## 2017-05-23 MED ORDER — DIPHENHYDRAMINE HCL 50 MG/ML IJ SOLN: 50.0000 mg | Freq: Once | INTRAMUSCULAR | Status: DC
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
## 2017-05-23 NOTE — ED Notes (Signed)
Pt refusing CT scans due to time constraints. Provider aware.

## 2017-05-23 NOTE — Discharge Instructions (Signed)
Please continue take Motrin and Tylenol for pain as this will help with inflammation.  I would use warm compresses to your back and neck.  Use cold compresses to your chest wall.  Have given you a short course of tramadol which helps with the pain however this medication will make you drowsy so be careful when taking this medication.  I would suggest taking this at night to help you sleep.  If you develop any worsening symptoms including bruising to your belly, right lower quadrant abdominal pain, fever, nausea, vomiting, shortness of breath return to the ED.  Make sure you follow-up with your primary care doctor 1-2 days.  May also soak in warm bath with Epsom salt water to help with the pain and muscle aches.
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
## 2017-05-23 NOTE — ED Notes (Signed)
Patient transported to CT 

## 2017-05-26 ENCOUNTER — Encounter: Payer: Self-pay | Admitting: Family Medicine

## 2017-05-26 ENCOUNTER — Ambulatory Visit (INDEPENDENT_AMBULATORY_CARE_PROVIDER_SITE_OTHER): Payer: Medicare Other | Admitting: Family Medicine

## 2017-05-26 VITALS — BP 110/70 | HR 88 | Temp 97.8°F | Ht 72.0 in | Wt 177.0 lb

## 2017-05-26 DIAGNOSIS — R0789 Other chest pain: Secondary | ICD-10-CM

## 2017-05-26 DIAGNOSIS — R300 Dysuria: Secondary | ICD-10-CM | POA: Diagnosis not present

## 2017-05-26 DIAGNOSIS — F411 Generalized anxiety disorder: Secondary | ICD-10-CM | POA: Diagnosis not present

## 2017-05-26 LAB — POCT URINALYSIS DIPSTICK
BILIRUBIN UA: NEGATIVE
Blood, UA: NEGATIVE
GLUCOSE UA: NEGATIVE
Leukocytes, UA: NEGATIVE
Nitrite, UA: NEGATIVE
PH UA: 6 (ref 5.0–8.0)
Protein, UA: NEGATIVE
Spec Grav, UA: >=1.03 — AB (ref 1.010–1.025)
UROBILINOGEN UA: 0.2 U/dL

## 2017-05-26 MED ORDER — BACLOFEN 20 MG PO TABS: 20.0000 mg | ORAL_TABLET | Freq: Three times a day (TID) | ORAL | 0 refills | Status: DC

## 2017-05-26 MED ORDER — LORAZEPAM 1 MG PO TABS: 0.5000 mg | ORAL_TABLET | Freq: Three times a day (TID) | ORAL | 0 refills | Status: AC | PRN

## 2017-05-26 NOTE — Assessment & Plan Note (Signed)
Patient requested refill on Ativan newly can get him to see his psychologist.  Advised him that this should ideally come from his psychiatrist.  His database was reviewed today without red flags. A Rx for 1 month was sent into his pharmacy.  Advised him that he should contact the psychiatrist office prior to filling his prescription.

## 2017-05-26 NOTE — Patient Instructions (Signed)
Please start the baclofen.  I sent in a refill for ativan.   We will check a urine sample.  Take care, Dr [name]
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
## 2017-05-28 LAB — URINE CULTURE
MICRO NUMBER: 90187437
RESULT: NO GROWTH
SPECIMEN QUALITY: ADEQUATE

## 2017-06-05 ENCOUNTER — Encounter: Payer: Self-pay | Admitting: Family Medicine

## 2017-06-05 ENCOUNTER — Ambulatory Visit (INDEPENDENT_AMBULATORY_CARE_PROVIDER_SITE_OTHER): Payer: Medicare Other | Admitting: Family Medicine

## 2017-06-05 VITALS — BP 118/74 | HR 90 | Temp 97.6°F | Ht 72.0 in | Wt 177.0 lb

## 2017-06-05 DIAGNOSIS — K4090 Unilateral inguinal hernia, without obstruction or gangrene, not specified as recurrent: Secondary | ICD-10-CM
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
## 2017-06-05 NOTE — Patient Instructions (Signed)
Please start the tramadol and baclofen.  I will send in a referral to surgery.  Take care, Dr [name]
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
## 2017-06-09 ENCOUNTER — Other Ambulatory Visit: Payer: Self-pay | Admitting: Family Medicine

## 2017-06-09 DIAGNOSIS — K219 Gastro-esophageal reflux disease without esophagitis: Secondary | ICD-10-CM

## 2017-06-15 ENCOUNTER — Other Ambulatory Visit: Payer: Self-pay | Admitting: Ophthalmology

## 2017-06-15 DIAGNOSIS — H02132 Senile ectropion of right lower eyelid: Secondary | ICD-10-CM | POA: Diagnosis not present

## 2017-06-15 DIAGNOSIS — H02135 Senile ectropion of left lower eyelid: Secondary | ICD-10-CM | POA: Diagnosis not present

## 2017-06-15 DIAGNOSIS — H0014 Chalazion left upper eyelid: Secondary | ICD-10-CM | POA: Diagnosis not present

## 2017-06-15 DIAGNOSIS — H02422 Myogenic ptosis of left eyelid: Secondary | ICD-10-CM | POA: Diagnosis not present

## 2017-07-01 ENCOUNTER — Other Ambulatory Visit: Payer: Self-pay

## 2017-07-01 MED ORDER — HYOSCYAMINE SULFATE SL 0.125 MG SL SUBL: 1.0000 | SUBLINGUAL_TABLET | Freq: Four times a day (QID) | SUBLINGUAL | 0 refills | Status: DC | PRN

## 2017-07-02 DIAGNOSIS — K4090 Unilateral inguinal hernia, without obstruction or gangrene, not specified as recurrent: Secondary | ICD-10-CM | POA: Diagnosis not present

## 2017-07-02 DIAGNOSIS — K429 Umbilical hernia without obstruction or gangrene: Secondary | ICD-10-CM | POA: Diagnosis not present

## 2017-07-02 DIAGNOSIS — Z86718 Personal history of other venous thrombosis and embolism: Secondary | ICD-10-CM | POA: Diagnosis not present

## 2017-07-03 ENCOUNTER — Ambulatory Visit (INDEPENDENT_AMBULATORY_CARE_PROVIDER_SITE_OTHER): Payer: Medicare Other

## 2017-07-03 ENCOUNTER — Encounter: Payer: Self-pay | Admitting: Family Medicine

## 2017-07-03 ENCOUNTER — Ambulatory Visit: Payer: Medicare Other | Admitting: Family Medicine

## 2017-07-03 ENCOUNTER — Ambulatory Visit (INDEPENDENT_AMBULATORY_CARE_PROVIDER_SITE_OTHER): Payer: Medicare Other | Admitting: Family Medicine

## 2017-07-03 ENCOUNTER — Ambulatory Visit: Payer: Self-pay | Admitting: *Deleted

## 2017-07-03 VITALS — BP 118/68 | HR 72 | Temp 97.6°F | Ht 72.0 in | Wt 177.0 lb

## 2017-07-03 DIAGNOSIS — K4090 Unilateral inguinal hernia, without obstruction or gangrene, not specified as recurrent: Secondary | ICD-10-CM

## 2017-07-03 DIAGNOSIS — R0789 Other chest pain: Secondary | ICD-10-CM | POA: Diagnosis not present

## 2017-07-03 DIAGNOSIS — R079 Chest pain, unspecified: Secondary | ICD-10-CM

## 2017-07-03 DIAGNOSIS — K148 Other diseases of tongue: Secondary | ICD-10-CM | POA: Diagnosis not present

## 2017-07-03 DIAGNOSIS — R030 Elevated blood-pressure reading, without diagnosis of hypertension: Secondary | ICD-10-CM | POA: Diagnosis not present

## 2017-07-03 MED ORDER — DICLOFENAC SODIUM 75 MG PO TBEC: 75.0000 mg | DELAYED_RELEASE_TABLET | Freq: Two times a day (BID) | ORAL | 0 refills | Status: DC

## 2017-07-03 MED ORDER — KETOROLAC TROMETHAMINE 60 MG/2ML IM SOLN
60.0000 mg | Freq: Once | INTRAMUSCULAR | Status: AC
  Administered 2017-07-03: 60 mg via INTRAMUSCULAR
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
## 2017-07-03 NOTE — Telephone Encounter (Signed)
I called patient and moved his appointment up to 11:40 per Dr Ellwood Handler request.

## 2017-07-03 NOTE — Assessment & Plan Note (Signed)
Stable.  We will continue to watch per surgery.

## 2017-07-03 NOTE — Telephone Encounter (Signed)
Noted  

## 2017-07-03 NOTE — Patient Instructions (Signed)
Your EKG and chest xray are normal.  Please try the voltaren for 5-7 days.  Let me know if your symptoms are worsening or are not improving.  Take care, Dr [name]
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
## 2017-07-27 DIAGNOSIS — M9903 Segmental and somatic dysfunction of lumbar region: Secondary | ICD-10-CM | POA: Diagnosis not present

## 2017-07-27 DIAGNOSIS — M9901 Segmental and somatic dysfunction of cervical region: Secondary | ICD-10-CM | POA: Diagnosis not present

## 2017-07-27 DIAGNOSIS — M5137 Other intervertebral disc degeneration, lumbosacral region: Secondary | ICD-10-CM | POA: Diagnosis not present

## 2017-07-27 DIAGNOSIS — M9904 Segmental and somatic dysfunction of sacral region: Secondary | ICD-10-CM | POA: Diagnosis not present

## 2017-07-27 DIAGNOSIS — M9902 Segmental and somatic dysfunction of thoracic region: Secondary | ICD-10-CM | POA: Diagnosis not present

## 2017-07-27 DIAGNOSIS — M9905 Segmental and somatic dysfunction of pelvic region: Secondary | ICD-10-CM | POA: Diagnosis not present

## 2017-07-28 ENCOUNTER — Other Ambulatory Visit: Payer: Self-pay

## 2017-07-28 ENCOUNTER — Encounter: Payer: Self-pay | Admitting: Family Medicine

## 2017-07-28 ENCOUNTER — Ambulatory Visit (INDEPENDENT_AMBULATORY_CARE_PROVIDER_SITE_OTHER): Payer: Medicare Other | Admitting: Family Medicine

## 2017-07-28 VITALS — BP 112/70 | HR 70 | Temp 97.7°F | Resp 16 | Ht 72.0 in | Wt 182.0 lb

## 2017-07-28 DIAGNOSIS — M7989 Other specified soft tissue disorders: Secondary | ICD-10-CM

## 2017-07-28 DIAGNOSIS — J309 Allergic rhinitis, unspecified: Secondary | ICD-10-CM | POA: Diagnosis not present

## 2017-07-28 DIAGNOSIS — K589 Irritable bowel syndrome without diarrhea: Secondary | ICD-10-CM

## 2017-07-28 DIAGNOSIS — M5136 Other intervertebral disc degeneration, lumbar region: Secondary | ICD-10-CM

## 2017-07-28 MED ORDER — DICLOFENAC SODIUM 75 MG PO TBEC: 75.0000 mg | DELAYED_RELEASE_TABLET | Freq: Two times a day (BID) | ORAL | 0 refills | Status: DC

## 2017-07-28 MED ORDER — HYOSCYAMINE SULFATE SL 0.125 MG SL SUBL
1.0000 | SUBLINGUAL_TABLET | Freq: Four times a day (QID) | SUBLINGUAL | 5 refills | Status: DC | PRN
Start: 2017-07-28 — End: 2017-09-14

## 2017-07-28 MED ORDER — KETOROLAC TROMETHAMINE 60 MG/2ML IM SOLN
60.0000 mg | Freq: Once | INTRAMUSCULAR | Status: AC
  Administered 2017-07-28: 60 mg via INTRAMUSCULAR

## 2017-07-28 MED ORDER — FLUTICASONE PROPIONATE 50 MCG/ACT NA SUSP: 2.0000 | Freq: Every day | NASAL | 11 refills | Status: DC | PRN
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
## 2017-07-28 NOTE — Patient Instructions (Addendum)
Please start the flonase and claritin or zyrtec.  Use the diclofenac as needed.  We will check blood work today.  Take care, Dr [name]

## 2017-07-28 NOTE — Assessment & Plan Note (Signed)
Stable.  Refilled hyoscyamine today.

## 2017-07-29 DIAGNOSIS — M5137 Other intervertebral disc degeneration, lumbosacral region: Secondary | ICD-10-CM | POA: Diagnosis not present

## 2017-07-29 DIAGNOSIS — M9901 Segmental and somatic dysfunction of cervical region: Secondary | ICD-10-CM | POA: Diagnosis not present

## 2017-07-29 DIAGNOSIS — M9905 Segmental and somatic dysfunction of pelvic region: Secondary | ICD-10-CM | POA: Diagnosis not present

## 2017-07-29 DIAGNOSIS — M9902 Segmental and somatic dysfunction of thoracic region: Secondary | ICD-10-CM | POA: Diagnosis not present

## 2017-07-29 DIAGNOSIS — M9904 Segmental and somatic dysfunction of sacral region: Secondary | ICD-10-CM | POA: Diagnosis not present

## 2017-07-29 DIAGNOSIS — M9903 Segmental and somatic dysfunction of lumbar region: Secondary | ICD-10-CM | POA: Diagnosis not present

## 2017-07-29 LAB — D-DIMER, QUANTITATIVE (NOT AT ARMC): D DIMER QUANT: 0.53 ug{FEU}/mL — AB (ref <0.50)

## 2017-08-03 DIAGNOSIS — M9901 Segmental and somatic dysfunction of cervical region: Secondary | ICD-10-CM | POA: Diagnosis not present

## 2017-08-03 DIAGNOSIS — M9905 Segmental and somatic dysfunction of pelvic region: Secondary | ICD-10-CM | POA: Diagnosis not present

## 2017-08-03 DIAGNOSIS — M9904 Segmental and somatic dysfunction of sacral region: Secondary | ICD-10-CM | POA: Diagnosis not present

## 2017-08-03 DIAGNOSIS — M9903 Segmental and somatic dysfunction of lumbar region: Secondary | ICD-10-CM | POA: Diagnosis not present

## 2017-08-03 DIAGNOSIS — M9902 Segmental and somatic dysfunction of thoracic region: Secondary | ICD-10-CM | POA: Diagnosis not present

## 2017-08-03 DIAGNOSIS — M5137 Other intervertebral disc degeneration, lumbosacral region: Secondary | ICD-10-CM | POA: Diagnosis not present

## 2017-08-04 DIAGNOSIS — J0141 Acute recurrent pansinusitis: Secondary | ICD-10-CM | POA: Insufficient documentation

## 2017-08-04 DIAGNOSIS — J302 Other seasonal allergic rhinitis: Secondary | ICD-10-CM | POA: Diagnosis not present

## 2017-08-05 DIAGNOSIS — M5137 Other intervertebral disc degeneration, lumbosacral region: Secondary | ICD-10-CM | POA: Diagnosis not present

## 2017-08-05 DIAGNOSIS — M9901 Segmental and somatic dysfunction of cervical region: Secondary | ICD-10-CM | POA: Diagnosis not present

## 2017-08-05 DIAGNOSIS — M9904 Segmental and somatic dysfunction of sacral region: Secondary | ICD-10-CM | POA: Diagnosis not present

## 2017-08-05 DIAGNOSIS — M9902 Segmental and somatic dysfunction of thoracic region: Secondary | ICD-10-CM | POA: Diagnosis not present

## 2017-08-05 DIAGNOSIS — M9905 Segmental and somatic dysfunction of pelvic region: Secondary | ICD-10-CM | POA: Diagnosis not present

## 2017-08-05 DIAGNOSIS — M9903 Segmental and somatic dysfunction of lumbar region: Secondary | ICD-10-CM | POA: Diagnosis not present

## 2017-08-06 DIAGNOSIS — G5603 Carpal tunnel syndrome, bilateral upper limbs: Secondary | ICD-10-CM | POA: Diagnosis not present

## 2017-08-06 DIAGNOSIS — M5442 Lumbago with sciatica, left side: Secondary | ICD-10-CM | POA: Diagnosis not present

## 2017-08-06 DIAGNOSIS — G603 Idiopathic progressive neuropathy: Secondary | ICD-10-CM | POA: Diagnosis not present

## 2017-08-06 DIAGNOSIS — M5441 Lumbago with sciatica, right side: Secondary | ICD-10-CM | POA: Diagnosis not present

## 2017-09-02 DIAGNOSIS — Z872 Personal history of diseases of the skin and subcutaneous tissue: Secondary | ICD-10-CM | POA: Diagnosis not present

## 2017-09-02 DIAGNOSIS — L570 Actinic keratosis: Secondary | ICD-10-CM | POA: Diagnosis not present

## 2017-09-02 DIAGNOSIS — L218 Other seborrheic dermatitis: Secondary | ICD-10-CM | POA: Diagnosis not present

## 2017-09-14 ENCOUNTER — Encounter: Payer: Self-pay | Admitting: Family Medicine

## 2017-09-14 ENCOUNTER — Ambulatory Visit (INDEPENDENT_AMBULATORY_CARE_PROVIDER_SITE_OTHER): Payer: Medicare Other | Admitting: Family Medicine

## 2017-09-14 VITALS — BP 120/74 | HR 81 | Temp 97.9°F | Ht 72.0 in | Wt 177.0 lb

## 2017-09-14 DIAGNOSIS — R0981 Nasal congestion: Secondary | ICD-10-CM

## 2017-09-14 DIAGNOSIS — L219 Seborrheic dermatitis, unspecified: Secondary | ICD-10-CM | POA: Diagnosis not present

## 2017-09-14 DIAGNOSIS — K219 Gastro-esophageal reflux disease without esophagitis: Secondary | ICD-10-CM | POA: Diagnosis not present

## 2017-09-14 DIAGNOSIS — K589 Irritable bowel syndrome without diarrhea: Secondary | ICD-10-CM | POA: Diagnosis not present

## 2017-09-14 MED ORDER — KETOCONAZOLE 2 % EX SHAM: 1.0000 "application " | MEDICATED_SHAMPOO | CUTANEOUS | 0 refills | Status: DC

## 2017-09-14 MED ORDER — OMEPRAZOLE 20 MG PO CPDR: 20.0000 mg | DELAYED_RELEASE_CAPSULE | Freq: Two times a day (BID) | ORAL | 3 refills | Status: DC

## 2017-09-14 MED ORDER — HYOSCYAMINE SULFATE SL 0.125 MG SL SUBL
1.0000 | SUBLINGUAL_TABLET | Freq: Four times a day (QID) | SUBLINGUAL | 5 refills | Status: DC | PRN
Start: 2017-09-14 — End: 2018-07-09

## 2017-09-14 MED ORDER — IPRATROPIUM BROMIDE 0.06 % NA SOLN: 2.0000 | Freq: Four times a day (QID) | NASAL | 0 refills | Status: DC

## 2017-09-14 NOTE — Assessment & Plan Note (Signed)
Stable. Hyoscyamine refilled.

## 2017-09-14 NOTE — Assessment & Plan Note (Signed)
Refilled omeprazole.

## 2017-09-14 NOTE — Progress Notes (Signed)
    Subjective:  David Armstrong is a 67 y.o. male who presents today for same-day appointment with a chief complaint of nasal congestion.   HPI:  Nasal Congestion, acute problem Started a few days ago. Associated with sneeze, cough, malaise, and sore throat. No fevers. Tried flonase which has helped a little. Neighbor's wife had been sick with somewhat similar symptoms. No obvious alleviating or aggravating factors.    GERD, Chronic problem Takes omprazole 20 mg as needed. Symptoms have worsened recently.   Seborrheic Dermatitis, New Problem Several year history. Tried several OTC meds. Worsened recently.   ROS: Per HPI  PMH: He reports that he has never smoked. He has never used smokeless tobacco. He reports that he does not drink alcohol or use drugs.  Objective:  Physical Exam: BP 120/74 (BP Location: Left Arm, Patient Position: Sitting, Cuff Size: Normal)   Pulse 81   Temp 97.9 F (36.6 C) (Oral)   Ht 6' (1.829 m)   Wt 177 lb (80.3 kg)   SpO2 96%   BMI 24.01 kg/m   Gen: NAD, resting comfortably HEENT: TMs clear bilaterally. OP clear. Nasal mucosa erythematous and boggy bilaterally with thick, white nasal discharge. Maxillary sinuses clear to transillumination bilaterally.   Assessment/Plan:  Nasal Congestion/Rhinorrhea No red flag signs or symptoms. Likely secondary to viral URI. Start atrovent nasal spray. Recommended good oral hydration. Recommended OTC antihistamines as needed. Discussed return precautions. Follow up as needed.   GERD Refilled omeprazole.   Seborrheic dermatitis Start ketoconzole shampoo. Follow up as needed.   IBS (irritable bowel syndrome) Stable. Hyoscyamine refilled.   Time Spent: I spent >40 minutes face-to-face with the patient, with more than half spent on counseling for possible etiologies and management of his nasal congestion, GERD, seborrheic dermatitis.  [name]. [name], MD 09/14/2017 3:46 PM

## 2017-09-14 NOTE — Assessment & Plan Note (Signed)
Start ketoconzole shampoo. Follow up as needed.

## 2017-09-14 NOTE — Patient Instructions (Addendum)
It was very nice to see you today!  I think you have a cold. Please start the atrovent spray. Let me know if your symptoms worsen or do not improve over the next few days.  I will send in the refill for your omeprazole.  Please try using the ketoconazole shampoo for your flaking.   Take care, Dr [name]

## 2017-09-22 ENCOUNTER — Encounter: Payer: Self-pay | Admitting: Family Medicine

## 2017-09-22 ENCOUNTER — Ambulatory Visit (INDEPENDENT_AMBULATORY_CARE_PROVIDER_SITE_OTHER): Payer: Medicare Other | Admitting: Family Medicine

## 2017-09-22 VITALS — BP 118/78 | HR 73 | Temp 97.8°F | Ht 72.0 in | Wt 175.8 lb

## 2017-09-22 DIAGNOSIS — J329 Chronic sinusitis, unspecified: Secondary | ICD-10-CM

## 2017-09-22 MED ORDER — AMOXICILLIN-POT CLAVULANATE 875-125 MG PO TABS: 1.0000 | ORAL_TABLET | Freq: Two times a day (BID) | ORAL | 0 refills | Status: DC

## 2017-09-22 MED ORDER — PREDNISONE 50 MG PO TABS: ORAL_TABLET | ORAL | 0 refills | Status: DC

## 2017-09-22 NOTE — Progress Notes (Signed)
   Subjective:  David Armstrong is a 67 y.o. male who presents today for same-day appointment with a chief complaint of nasal congestion.   HPI:  Nasal Congestion, acute problem Pt seen about a week ago for this. Started on atrovent nasal spray. Symptoms are a little better. Patient did not tolerated the nasal spray because it made him feel "spaced out." Not sure if it helped as he only used it a couple times.  No fevers or chills. Sometimes has a tickling in his throat. Occasionally coughs up thick, green mucus. No obvious precipitating or alleviating factors. He was seen at ENT about 6-7 weeks ago and prescribed augmentin. He is not sure if he ever started this.   ROS: Per HPI  PMH: He reports that he has never smoked. He has never used smokeless tobacco. He reports that he does not drink alcohol or use drugs.  Objective:  Physical Exam: BP 118/78 (BP Location: Left Arm, Patient Position: Sitting, Cuff Size: Normal)   Pulse 73   Temp 97.8 F (36.6 C) (Oral)   Ht 6' (1.829 m)   Wt 175 lb 12.8 oz (79.7 kg)   SpO2 97%   BMI 23.84 kg/m   Gen: NAD, resting comfortably HEENT: TMs clear bilaterally. OP clear. Nasal mucosa erythematous and boggy bilaterally with thich, white discharge.  CV: RRR with no murmurs appreciated Pulm: NWOB, CTAB with no crackles, wheezes, or rhonchi  Assessment/Plan:  Sinusitis Will start course of augmentin today given that symptoms have been persistent for a week+ without significant relief. He does not have any red flag signs or symptoms. Will stop atrovent today as he has not had significant relief and additionally he was not able to tolerate due to side effects.   Time Spent: I spent >25 minutes face-to-face with the patient, with more than half spent on counseling for etiology, pathogenesis, typical course of illness and management plan for his sinusitis.   [name]. [name], MD 09/22/2017 3:34 PM

## 2017-09-22 NOTE — Patient Instructions (Signed)
Start the augmentin and prednisone if your symptoms worsen or do not improve in a few days.  Please stay well hydrated.  You can take tylenol and/or motrin as needed for low grade fever and pain.  Please let me know if your symptoms worsen or fail to improve.  Take care, Dr [name]

## 2017-10-08 DIAGNOSIS — M9903 Segmental and somatic dysfunction of lumbar region: Secondary | ICD-10-CM | POA: Diagnosis not present

## 2017-10-08 DIAGNOSIS — M9905 Segmental and somatic dysfunction of pelvic region: Secondary | ICD-10-CM | POA: Diagnosis not present

## 2017-10-08 DIAGNOSIS — M9904 Segmental and somatic dysfunction of sacral region: Secondary | ICD-10-CM | POA: Diagnosis not present

## 2017-10-08 DIAGNOSIS — M5137 Other intervertebral disc degeneration, lumbosacral region: Secondary | ICD-10-CM | POA: Diagnosis not present

## 2017-10-12 DIAGNOSIS — M9904 Segmental and somatic dysfunction of sacral region: Secondary | ICD-10-CM | POA: Diagnosis not present

## 2017-10-12 DIAGNOSIS — M5137 Other intervertebral disc degeneration, lumbosacral region: Secondary | ICD-10-CM | POA: Diagnosis not present

## 2017-10-12 DIAGNOSIS — M9903 Segmental and somatic dysfunction of lumbar region: Secondary | ICD-10-CM | POA: Diagnosis not present

## 2017-10-12 DIAGNOSIS — M9905 Segmental and somatic dysfunction of pelvic region: Secondary | ICD-10-CM | POA: Diagnosis not present

## 2017-10-14 DIAGNOSIS — M9905 Segmental and somatic dysfunction of pelvic region: Secondary | ICD-10-CM | POA: Diagnosis not present

## 2017-10-14 DIAGNOSIS — M9903 Segmental and somatic dysfunction of lumbar region: Secondary | ICD-10-CM | POA: Diagnosis not present

## 2017-10-14 DIAGNOSIS — M9904 Segmental and somatic dysfunction of sacral region: Secondary | ICD-10-CM | POA: Diagnosis not present

## 2017-10-14 DIAGNOSIS — M5137 Other intervertebral disc degeneration, lumbosacral region: Secondary | ICD-10-CM | POA: Diagnosis not present

## 2017-10-28 ENCOUNTER — Other Ambulatory Visit: Payer: Self-pay

## 2017-10-28 MED ORDER — KETOCONAZOLE 2 % EX SHAM: 1.0000 "application " | MEDICATED_SHAMPOO | CUTANEOUS | 0 refills | Status: DC

## 2017-11-12 DIAGNOSIS — M5441 Lumbago with sciatica, right side: Secondary | ICD-10-CM | POA: Diagnosis not present

## 2017-11-12 DIAGNOSIS — M5481 Occipital neuralgia: Secondary | ICD-10-CM | POA: Diagnosis not present

## 2017-11-12 DIAGNOSIS — R200 Anesthesia of skin: Secondary | ICD-10-CM | POA: Diagnosis not present

## 2017-11-12 DIAGNOSIS — M542 Cervicalgia: Secondary | ICD-10-CM | POA: Diagnosis not present

## 2017-11-12 DIAGNOSIS — M5442 Lumbago with sciatica, left side: Secondary | ICD-10-CM | POA: Diagnosis not present

## 2017-11-12 DIAGNOSIS — G5603 Carpal tunnel syndrome, bilateral upper limbs: Secondary | ICD-10-CM | POA: Diagnosis not present

## 2017-11-19 DIAGNOSIS — H019 Unspecified inflammation of eyelid: Secondary | ICD-10-CM | POA: Diagnosis not present

## 2017-11-19 DIAGNOSIS — H04129 Dry eye syndrome of unspecified lacrimal gland: Secondary | ICD-10-CM | POA: Diagnosis not present

## 2017-12-04 ENCOUNTER — Encounter: Payer: Self-pay | Admitting: Family Medicine

## 2017-12-04 ENCOUNTER — Ambulatory Visit (INDEPENDENT_AMBULATORY_CARE_PROVIDER_SITE_OTHER): Payer: Medicare Other | Admitting: Family Medicine

## 2017-12-04 VITALS — BP 116/74 | HR 72 | Temp 97.7°F | Ht 72.0 in | Wt 170.4 lb

## 2017-12-04 DIAGNOSIS — B001 Herpesviral vesicular dermatitis: Secondary | ICD-10-CM | POA: Diagnosis not present

## 2017-12-04 DIAGNOSIS — L853 Xerosis cutis: Secondary | ICD-10-CM | POA: Diagnosis not present

## 2017-12-04 DIAGNOSIS — L219 Seborrheic dermatitis, unspecified: Secondary | ICD-10-CM | POA: Diagnosis not present

## 2017-12-04 MED ORDER — ACYCLOVIR 5 % EX OINT: 1.0000 "application " | TOPICAL_OINTMENT | CUTANEOUS | 0 refills | Status: DC

## 2017-12-04 MED ORDER — DESONIDE 0.05 % EX LOTN: 1.0000 "application " | TOPICAL_LOTION | Freq: Every day | CUTANEOUS | 1 refills | Status: DC | PRN

## 2017-12-04 NOTE — Assessment & Plan Note (Signed)
Stable. Continue ketoconazole 2% daily. Refill desonide 0.05%.

## 2017-12-04 NOTE — Patient Instructions (Addendum)
It was very nice to see you today!  You have dry skin on your back. I think this is causing your symptoms. Please make sure you apply lotion to this area. You can also use over the counter hydrocortisone cream twice daily for the next 1-2 weeks.  I will refill your desonide today.   Start the acyclovir cream for your cold sore.   Take care, Dr [name]

## 2017-12-04 NOTE — Progress Notes (Signed)
   Subjective:  David Armstrong is a 67 y.o. male who presents today for same-day appointment with a chief complaint of back pain.   HPI:  Back Pain, Acute problem Started several weeks months ago. Worsening over that time. Located in right upper back. No injuries or other obvious precipitating events. No specific treatments tried. Associated with some itchiness and burning-type sensation.  Seborrheic Dermatitis, Chronic problem Stable on ketaconzole and desonide 0.05% lotion  Herpes Labialis, acute problem Started about a week ago. Has been trying abreva with modest improvement.   ROS: Per HPI  PMH: He reports that he has never smoked. He has never used smokeless tobacco. He reports that he does not drink alcohol or use drugs.  Objective:  Physical Exam: BP 116/74 (BP Location: Left Arm, Patient Position: Sitting, Cuff Size: Normal)   Pulse 72   Temp 97.7 F (36.5 C) (Oral)   Ht 6' (1.829 m)   Wt 170 lb 6.4 oz (77.3 kg)   SpO2 96%   BMI 23.11 kg/m   Gen: NAD, resting comfortably CV: RRR with no murmurs appreciated Pulm: NWOB, CTAB with no crackles, wheezes, or rhonchi GI: Normal bowel sounds present. Soft, Nontender, Nondistended. MSK:  -Back: No deformities. Nontender to palpatation. FROM without Pain.  -LUE: No deformities. Strength 5/5 in all directions. Neurovascularly intact distally.  Skin: Dry appearing skin on back with scattered folliculitis and excoritations  Assessment/Plan:  Xerosis Likely the source of patient's symptoms. History and exam not consistent with MSK etiology. Recommended use of topical emollient and hydrocortisone cream. Discussed good skin care. Discussed reasons to return to care.   Herpes Labialis Start acyclovir ointment. Patient does not recall any previous adverse reaction to acyclovir.   Seborrheic dermatitis Stable. Continue ketoconazole 2% daily. Refill desonide 0.05%.   Time Spent: I spent >40 minutes face-to-face with the  patient, with more than half spent on counseling for treatment plan for his xerosis and seborrheic dermatitis.   [name]. [name], MD 12/04/2017 2:09 PM

## 2017-12-10 DIAGNOSIS — M5417 Radiculopathy, lumbosacral region: Secondary | ICD-10-CM | POA: Diagnosis not present

## 2017-12-10 DIAGNOSIS — G5623 Lesion of ulnar nerve, bilateral upper limbs: Secondary | ICD-10-CM | POA: Diagnosis not present

## 2017-12-10 DIAGNOSIS — G603 Idiopathic progressive neuropathy: Secondary | ICD-10-CM | POA: Diagnosis not present

## 2017-12-10 DIAGNOSIS — R252 Cramp and spasm: Secondary | ICD-10-CM | POA: Diagnosis not present

## 2017-12-10 DIAGNOSIS — G5603 Carpal tunnel syndrome, bilateral upper limbs: Secondary | ICD-10-CM | POA: Diagnosis not present

## 2017-12-10 DIAGNOSIS — R202 Paresthesia of skin: Secondary | ICD-10-CM | POA: Diagnosis not present

## 2017-12-10 DIAGNOSIS — M5481 Occipital neuralgia: Secondary | ICD-10-CM | POA: Diagnosis not present

## 2017-12-22 DIAGNOSIS — Z961 Presence of intraocular lens: Secondary | ICD-10-CM | POA: Diagnosis not present

## 2017-12-22 DIAGNOSIS — H35371 Puckering of macula, right eye: Secondary | ICD-10-CM | POA: Diagnosis not present

## 2017-12-22 DIAGNOSIS — H01009 Unspecified blepharitis unspecified eye, unspecified eyelid: Secondary | ICD-10-CM | POA: Diagnosis not present

## 2018-01-04 DIAGNOSIS — M9904 Segmental and somatic dysfunction of sacral region: Secondary | ICD-10-CM | POA: Diagnosis not present

## 2018-01-04 DIAGNOSIS — M9905 Segmental and somatic dysfunction of pelvic region: Secondary | ICD-10-CM | POA: Diagnosis not present

## 2018-01-04 DIAGNOSIS — M5137 Other intervertebral disc degeneration, lumbosacral region: Secondary | ICD-10-CM | POA: Diagnosis not present

## 2018-01-04 DIAGNOSIS — M9903 Segmental and somatic dysfunction of lumbar region: Secondary | ICD-10-CM | POA: Diagnosis not present

## 2018-01-06 DIAGNOSIS — M9905 Segmental and somatic dysfunction of pelvic region: Secondary | ICD-10-CM | POA: Diagnosis not present

## 2018-01-06 DIAGNOSIS — M9904 Segmental and somatic dysfunction of sacral region: Secondary | ICD-10-CM | POA: Diagnosis not present

## 2018-01-06 DIAGNOSIS — M9903 Segmental and somatic dysfunction of lumbar region: Secondary | ICD-10-CM | POA: Diagnosis not present

## 2018-01-06 DIAGNOSIS — M5137 Other intervertebral disc degeneration, lumbosacral region: Secondary | ICD-10-CM | POA: Diagnosis not present

## 2018-01-07 DIAGNOSIS — R51 Headache: Secondary | ICD-10-CM | POA: Diagnosis not present

## 2018-01-07 DIAGNOSIS — G603 Idiopathic progressive neuropathy: Secondary | ICD-10-CM | POA: Diagnosis not present

## 2018-01-07 DIAGNOSIS — G5603 Carpal tunnel syndrome, bilateral upper limbs: Secondary | ICD-10-CM | POA: Diagnosis not present

## 2018-01-07 DIAGNOSIS — M5412 Radiculopathy, cervical region: Secondary | ICD-10-CM | POA: Diagnosis not present

## 2018-01-07 DIAGNOSIS — M5417 Radiculopathy, lumbosacral region: Secondary | ICD-10-CM | POA: Diagnosis not present

## 2018-01-19 DIAGNOSIS — L720 Epidermal cyst: Secondary | ICD-10-CM | POA: Diagnosis not present

## 2018-01-19 DIAGNOSIS — L111 Transient acantholytic dermatosis [Grover]: Secondary | ICD-10-CM | POA: Diagnosis not present

## 2018-01-19 DIAGNOSIS — L821 Other seborrheic keratosis: Secondary | ICD-10-CM | POA: Diagnosis not present

## 2018-01-19 DIAGNOSIS — L814 Other melanin hyperpigmentation: Secondary | ICD-10-CM | POA: Diagnosis not present

## 2018-01-19 DIAGNOSIS — L570 Actinic keratosis: Secondary | ICD-10-CM | POA: Diagnosis not present

## 2018-01-19 DIAGNOSIS — D229 Melanocytic nevi, unspecified: Secondary | ICD-10-CM | POA: Diagnosis not present

## 2018-01-19 DIAGNOSIS — L218 Other seborrheic dermatitis: Secondary | ICD-10-CM | POA: Diagnosis not present

## 2018-01-19 DIAGNOSIS — L819 Disorder of pigmentation, unspecified: Secondary | ICD-10-CM | POA: Diagnosis not present

## 2018-01-19 DIAGNOSIS — D1801 Hemangioma of skin and subcutaneous tissue: Secondary | ICD-10-CM | POA: Diagnosis not present

## 2018-02-05 ENCOUNTER — Encounter: Payer: Self-pay | Admitting: Family Medicine

## 2018-02-05 ENCOUNTER — Ambulatory Visit (INDEPENDENT_AMBULATORY_CARE_PROVIDER_SITE_OTHER): Payer: Medicare Other | Admitting: Family Medicine

## 2018-02-05 VITALS — BP 120/76 | HR 74 | Temp 97.6°F | Ht 72.0 in | Wt 169.2 lb

## 2018-02-05 DIAGNOSIS — E785 Hyperlipidemia, unspecified: Secondary | ICD-10-CM | POA: Diagnosis not present

## 2018-02-05 DIAGNOSIS — R350 Frequency of micturition: Secondary | ICD-10-CM

## 2018-02-05 DIAGNOSIS — N200 Calculus of kidney: Secondary | ICD-10-CM

## 2018-02-05 DIAGNOSIS — L989 Disorder of the skin and subcutaneous tissue, unspecified: Secondary | ICD-10-CM

## 2018-02-05 DIAGNOSIS — M549 Dorsalgia, unspecified: Secondary | ICD-10-CM | POA: Diagnosis not present

## 2018-02-05 LAB — COMPREHENSIVE METABOLIC PANEL
ALK PHOS: 86 U/L (ref 39–117)
ALT: 19 U/L (ref 0–53)
AST: 21 U/L (ref 0–37)
Albumin: 4.3 g/dL (ref 3.5–5.2)
BILIRUBIN TOTAL: 0.7 mg/dL (ref 0.2–1.2)
BUN: 21 mg/dL (ref 6–23)
CALCIUM: 9.8 mg/dL (ref 8.4–10.5)
CO2: 28 mEq/L (ref 19–32)
Chloride: 106 mEq/L (ref 96–112)
Creatinine, Ser: 0.88 mg/dL (ref 0.40–1.50)
GFR: 91.84 mL/min (ref >60.00)
Glucose, Bld: 81 mg/dL (ref 70–99)
POTASSIUM: 4.7 meq/L (ref 3.5–5.1)
Sodium: 140 mEq/L (ref 135–145)
TOTAL PROTEIN: 7.2 g/dL (ref 6.0–8.3)

## 2018-02-05 LAB — CBC
HEMATOCRIT: 47.3 % (ref 39.0–52.0)
HEMOGLOBIN: 16 g/dL (ref 13.0–17.0)
MCHC: 33.9 g/dL (ref 30.0–36.0)
MCV: 91.5 fl (ref 78.0–100.0)
PLATELETS: 173 10*3/uL (ref 150.0–400.0)
RBC: 5.17 Mil/uL (ref 4.22–5.81)
RDW: 15 % (ref 11.5–15.5)
WBC: 5.2 10*3/uL (ref 4.0–10.5)

## 2018-02-05 LAB — LIPID PANEL
Cholesterol: 219 mg/dL — ABNORMAL HIGH (ref 0–200)
HDL: 55.3 mg/dL (ref >39.00)
LDL Cholesterol: 130 mg/dL — ABNORMAL HIGH (ref 0–99)
NonHDL: 163.63
TRIGLYCERIDES: 169 mg/dL — AB (ref 0.0–149.0)
Total CHOL/HDL Ratio: 4
VLDL: 33.8 mg/dL (ref 0.0–40.0)

## 2018-02-05 LAB — POCT URINALYSIS DIPSTICK
Blood, UA: NEGATIVE
GLUCOSE UA: NEGATIVE
Leukocytes, UA: NEGATIVE
NITRITE UA: NEGATIVE
PROTEIN UA: NEGATIVE
Spec Grav, UA: 1.025 (ref 1.010–1.025)
Urobilinogen, UA: 0.2 E.U./dL
pH, UA: 6 (ref 5.0–8.0)

## 2018-02-05 MED ORDER — KETOROLAC TROMETHAMINE 60 MG/2ML IM SOLN
60.0000 mg | Freq: Once | INTRAMUSCULAR | Status: AC
  Administered 2018-02-05: 60 mg via INTRAMUSCULAR

## 2018-02-05 NOTE — Progress Notes (Signed)
Subjective:  David Armstrong is a 67 y.o. male who presents today with a chief complaint of back pain.   HPI:  Back Pain Started a few months ago.  Located left mid back near his flank.  Patient is concerned that he has had recurrence of his kidney stones.  Symptoms worsened recently.  No specific treatments tried.  He has had also some increased urinary frequency over the last few weeks, he has been drinking a lot of caffeine, is not sure if this is contributed.  No nausea or vomiting.  No notable hematuria.  Skin Lesion Located on inner left thigh. Saw a dermatologist last week and had cryotherapy. Lesions  ROS: Per HPI  PMH: He reports that he has never smoked. He has never used smokeless tobacco. He reports that he does not drink alcohol or use drugs.  Objective:  Physical Exam: BP 120/76 (BP Location: Left Arm, Patient Position: Sitting, Cuff Size: Normal)   Pulse 74   Temp 97.6 F (36.4 C) (Oral)   Ht 6' (1.829 m)   Wt 169 lb 3.2 oz (76.7 kg)   SpO2 97%   BMI 22.95 kg/m   Gen: NAD, resting comfortably CV: RRR with no murmurs appreciated Pulm: NWOB, CTAB with no crackles, wheezes, or rhonchi GI: Normal bowel sounds present. Soft, Nontender, Nondistended. MSK: No edema, cyanosis, or clubbing noted.  Mild left-sided CVA tenderness. Skin: Warm, dry.  Hyperkeratotic lesion on left inner thigh with surrounding erythema. Neuro: Grossly normal, moves all extremities Psych: Normal affect and thought content  Results for orders placed or performed in visit on 02/05/18 (from the past 24 hour(s))  POCT urinalysis dipstick     Status: Abnormal   Collection Time: 02/05/18  9:52 AM  Result Value Ref Range   Color, UA Yellow    Clarity, UA Clear    Glucose, UA Negative Negative   Bilirubin, UA Small    Ketones, UA Trace    Spec Grav, UA 1.025 1.010 - 1.025   Blood, UA Negative    pH, UA 6.0 5.0 - 8.0   Protein, UA Negative Negative   Urobilinogen, UA 0.2 0.2 or 1.0 E.U./dL     Nitrite, UA Negative    Leukocytes, UA Negative Negative   Appearance     Odor       Assessment/Plan:  Nephrolithiasis Had a lengthy discussion with patient regarding possible etiology of his back pain today.  Reviewed prior imaging with patient.  He was found to have findings of left renal stones on imaging earlier this year.  This could potentially be contributing to his left flank pain, however he does not have any blood on his UA.  We will check a renal ultrasound to rule out progression of the stones or hydronephrosis.  Will give 60 mg IM of Toradol today.  Check CBC and CMET today as well.  Dyslipidemia Check lipid panel today.  Back pain See above.  Possibly nephrolithiasis.  Also possible musculoskeletal pain.  Will give 60 mg IM Toradol today.  Evaluate for nephrolithiasis as noted above.  If negative, would consider referral to sports medicine/orthopedics.  Urinary frequency Unclear etiology.  Likely secondary to excessive caffeine intake.  UA today positive for small ketones, otherwise negative.  Recommend patient to cut back on his caffeine use.  Would recommend follow-up back with urology if symptoms persist.  Skin lesion Appears to be actinic keratosis, however somewhat difficult to distinguish given recent cryotherapy.  No signs of infection.  Reassured patient.  Continue with watchful waiting.  Time Spent: I spent >40 minutes face-to-face with the patient, with more than half spent on counseling for management plan for his back pain, urinary frequency, and possible recurrent nephrolithiasis.[name]. [name], MD 02/05/2018 12:19 PM

## 2018-02-05 NOTE — Patient Instructions (Signed)
It was very nice to see you today!  We will check blood work today.  We will also set you up to have an ultrasound of your kidney done.  We will give you an injection of an anti-inflammatory today. Please do not take any other anti-inflammatories for the rest of the day.   Take care, Dr [name]

## 2018-02-05 NOTE — Assessment & Plan Note (Signed)
Check lipid panel today 

## 2018-02-05 NOTE — Assessment & Plan Note (Signed)
Had a lengthy discussion with patient regarding possible etiology of his back pain today.  Reviewed prior imaging with patient.  He was found to have findings of left renal stones on imaging earlier this year.  This could potentially be contributing to his left flank pain, however he does not have any blood on his UA.  We will check a renal ultrasound to rule out progression of the stones or hydronephrosis.  Will give 60 mg IM of Toradol today.  Check CBC and CMET today as well.

## 2018-02-07 LAB — URINE CULTURE
MICRO NUMBER:: 91286561
RESULT: NO GROWTH
SPECIMEN QUALITY:: ADEQUATE

## 2018-02-08 NOTE — Progress Notes (Signed)
Please inform patient of the following:  Urine culture was negative  Blood counts are normal.  Electrolytes, kidney function, liver function, and blood sugar levels are normal.  His cholesterol was a bit high. He would benefit from starting lipitor 40mg  daily to improve his numbers and lower his risk of heart attack and stroke. He should continue working on diet and exercise. We can recheck in 1 year.   David Armstrong. [name], MD 02/08/2018 1:11 PM

## 2018-02-16 ENCOUNTER — Telehealth: Payer: Self-pay | Admitting: Family Medicine

## 2018-02-16 NOTE — Telephone Encounter (Signed)
See result note.  

## 2018-02-16 NOTE — Telephone Encounter (Signed)
See note  Copied from [place] [phone]. Topic: General - Other >> Feb 16, 2018  1:41 PM Wynetta Emery, [place] C wrote: Reason for CRM: pt is calling in for his results.  CB: [phone]

## 2018-02-24 DIAGNOSIS — K648 Other hemorrhoids: Secondary | ICD-10-CM | POA: Insufficient documentation

## 2018-03-01 DIAGNOSIS — M9903 Segmental and somatic dysfunction of lumbar region: Secondary | ICD-10-CM | POA: Diagnosis not present

## 2018-03-01 DIAGNOSIS — M9904 Segmental and somatic dysfunction of sacral region: Secondary | ICD-10-CM | POA: Diagnosis not present

## 2018-03-01 DIAGNOSIS — M5137 Other intervertebral disc degeneration, lumbosacral region: Secondary | ICD-10-CM | POA: Diagnosis not present

## 2018-03-01 DIAGNOSIS — M9905 Segmental and somatic dysfunction of pelvic region: Secondary | ICD-10-CM | POA: Diagnosis not present

## 2018-03-04 ENCOUNTER — Telehealth: Payer: Self-pay | Admitting: Family Medicine

## 2018-03-04 NOTE — Telephone Encounter (Signed)
See patients request below / OV 02/05/18 with Dr. [name].  Patient has agreed to start Lipitor.  Pharmacy verified

## 2018-03-04 NOTE — Telephone Encounter (Signed)
Copied from [place] [id number]. Topic: Quick Communication - See Telephone Encounter >> Mar 04, 2018 10:34 AM Ivar Drape wrote: CRM for notification. See Telephone encounter for: 03/04/18. Patient stated he has decided to take the Lipitor that was suggested he take for his high cholesterol. It can be sent to his preferred pharmacy [facility] on [address] in [place].

## 2018-03-05 ENCOUNTER — Other Ambulatory Visit: Payer: Self-pay

## 2018-03-05 MED ORDER — ATORVASTATIN CALCIUM 40 MG PO TABS: 40.0000 mg | ORAL_TABLET | Freq: Every day | ORAL | 3 refills | Status: DC

## 2018-03-05 NOTE — Telephone Encounter (Signed)
Rx sent to pharmacy   

## 2018-03-08 DIAGNOSIS — M9903 Segmental and somatic dysfunction of lumbar region: Secondary | ICD-10-CM | POA: Diagnosis not present

## 2018-03-08 DIAGNOSIS — M5137 Other intervertebral disc degeneration, lumbosacral region: Secondary | ICD-10-CM | POA: Diagnosis not present

## 2018-03-08 DIAGNOSIS — M9904 Segmental and somatic dysfunction of sacral region: Secondary | ICD-10-CM | POA: Diagnosis not present

## 2018-03-08 DIAGNOSIS — M9905 Segmental and somatic dysfunction of pelvic region: Secondary | ICD-10-CM | POA: Diagnosis not present

## 2018-03-10 ENCOUNTER — Other Ambulatory Visit: Payer: Self-pay | Admitting: Family Medicine

## 2018-03-10 DIAGNOSIS — L218 Other seborrheic dermatitis: Secondary | ICD-10-CM | POA: Diagnosis not present

## 2018-03-10 DIAGNOSIS — L821 Other seborrheic keratosis: Secondary | ICD-10-CM | POA: Diagnosis not present

## 2018-03-10 DIAGNOSIS — L570 Actinic keratosis: Secondary | ICD-10-CM | POA: Diagnosis not present

## 2018-03-15 DIAGNOSIS — D485 Neoplasm of uncertain behavior of skin: Secondary | ICD-10-CM | POA: Diagnosis not present

## 2018-03-15 DIAGNOSIS — H0015 Chalazion left lower eyelid: Secondary | ICD-10-CM | POA: Diagnosis not present

## 2018-03-15 DIAGNOSIS — D210 Benign neoplasm of connective and other soft tissue of head, face and neck: Secondary | ICD-10-CM | POA: Diagnosis not present

## 2018-03-15 DIAGNOSIS — H0011 Chalazion right upper eyelid: Secondary | ICD-10-CM | POA: Diagnosis not present

## 2018-03-15 DIAGNOSIS — H0012 Chalazion right lower eyelid: Secondary | ICD-10-CM | POA: Diagnosis not present

## 2018-03-15 DIAGNOSIS — H0014 Chalazion left upper eyelid: Secondary | ICD-10-CM | POA: Diagnosis not present

## 2018-03-16 DIAGNOSIS — M9904 Segmental and somatic dysfunction of sacral region: Secondary | ICD-10-CM | POA: Diagnosis not present

## 2018-03-16 DIAGNOSIS — M9903 Segmental and somatic dysfunction of lumbar region: Secondary | ICD-10-CM | POA: Diagnosis not present

## 2018-03-16 DIAGNOSIS — M9905 Segmental and somatic dysfunction of pelvic region: Secondary | ICD-10-CM | POA: Diagnosis not present

## 2018-03-16 DIAGNOSIS — M5137 Other intervertebral disc degeneration, lumbosacral region: Secondary | ICD-10-CM | POA: Diagnosis not present

## 2018-03-17 DIAGNOSIS — K648 Other hemorrhoids: Secondary | ICD-10-CM | POA: Diagnosis not present

## 2018-03-24 DIAGNOSIS — M9903 Segmental and somatic dysfunction of lumbar region: Secondary | ICD-10-CM | POA: Diagnosis not present

## 2018-03-24 DIAGNOSIS — M9905 Segmental and somatic dysfunction of pelvic region: Secondary | ICD-10-CM | POA: Diagnosis not present

## 2018-03-24 DIAGNOSIS — M9904 Segmental and somatic dysfunction of sacral region: Secondary | ICD-10-CM | POA: Diagnosis not present

## 2018-03-24 DIAGNOSIS — M5137 Other intervertebral disc degeneration, lumbosacral region: Secondary | ICD-10-CM | POA: Diagnosis not present

## 2018-03-26 DIAGNOSIS — K648 Other hemorrhoids: Secondary | ICD-10-CM | POA: Diagnosis not present

## 2018-04-01 DIAGNOSIS — M9905 Segmental and somatic dysfunction of pelvic region: Secondary | ICD-10-CM | POA: Diagnosis not present

## 2018-04-01 DIAGNOSIS — M5137 Other intervertebral disc degeneration, lumbosacral region: Secondary | ICD-10-CM | POA: Diagnosis not present

## 2018-04-01 DIAGNOSIS — M9903 Segmental and somatic dysfunction of lumbar region: Secondary | ICD-10-CM | POA: Diagnosis not present

## 2018-04-01 DIAGNOSIS — M9904 Segmental and somatic dysfunction of sacral region: Secondary | ICD-10-CM | POA: Diagnosis not present

## 2018-04-05 DIAGNOSIS — M9904 Segmental and somatic dysfunction of sacral region: Secondary | ICD-10-CM | POA: Diagnosis not present

## 2018-04-05 DIAGNOSIS — M9903 Segmental and somatic dysfunction of lumbar region: Secondary | ICD-10-CM | POA: Diagnosis not present

## 2018-04-05 DIAGNOSIS — M9905 Segmental and somatic dysfunction of pelvic region: Secondary | ICD-10-CM | POA: Diagnosis not present

## 2018-04-05 DIAGNOSIS — M5137 Other intervertebral disc degeneration, lumbosacral region: Secondary | ICD-10-CM | POA: Diagnosis not present

## 2018-04-08 DIAGNOSIS — M9905 Segmental and somatic dysfunction of pelvic region: Secondary | ICD-10-CM | POA: Diagnosis not present

## 2018-04-08 DIAGNOSIS — M9903 Segmental and somatic dysfunction of lumbar region: Secondary | ICD-10-CM | POA: Diagnosis not present

## 2018-04-08 DIAGNOSIS — M9904 Segmental and somatic dysfunction of sacral region: Secondary | ICD-10-CM | POA: Diagnosis not present

## 2018-04-08 DIAGNOSIS — M5137 Other intervertebral disc degeneration, lumbosacral region: Secondary | ICD-10-CM | POA: Diagnosis not present

## 2018-04-12 DIAGNOSIS — M9904 Segmental and somatic dysfunction of sacral region: Secondary | ICD-10-CM | POA: Diagnosis not present

## 2018-04-12 DIAGNOSIS — M9903 Segmental and somatic dysfunction of lumbar region: Secondary | ICD-10-CM | POA: Diagnosis not present

## 2018-04-12 DIAGNOSIS — M5137 Other intervertebral disc degeneration, lumbosacral region: Secondary | ICD-10-CM | POA: Diagnosis not present

## 2018-04-12 DIAGNOSIS — H01009 Unspecified blepharitis unspecified eye, unspecified eyelid: Secondary | ICD-10-CM | POA: Diagnosis not present

## 2018-04-12 DIAGNOSIS — M9905 Segmental and somatic dysfunction of pelvic region: Secondary | ICD-10-CM | POA: Diagnosis not present

## 2018-04-13 DIAGNOSIS — M9903 Segmental and somatic dysfunction of lumbar region: Secondary | ICD-10-CM | POA: Diagnosis not present

## 2018-04-13 DIAGNOSIS — M9905 Segmental and somatic dysfunction of pelvic region: Secondary | ICD-10-CM | POA: Diagnosis not present

## 2018-04-13 DIAGNOSIS — M5137 Other intervertebral disc degeneration, lumbosacral region: Secondary | ICD-10-CM | POA: Diagnosis not present

## 2018-04-13 DIAGNOSIS — M9904 Segmental and somatic dysfunction of sacral region: Secondary | ICD-10-CM | POA: Diagnosis not present

## 2018-04-15 DIAGNOSIS — M5481 Occipital neuralgia: Secondary | ICD-10-CM | POA: Diagnosis not present

## 2018-04-15 DIAGNOSIS — R200 Anesthesia of skin: Secondary | ICD-10-CM | POA: Diagnosis not present

## 2018-04-15 DIAGNOSIS — M542 Cervicalgia: Secondary | ICD-10-CM | POA: Diagnosis not present

## 2018-04-15 DIAGNOSIS — M5440 Lumbago with sciatica, unspecified side: Secondary | ICD-10-CM | POA: Diagnosis not present

## 2018-04-15 DIAGNOSIS — D485 Neoplasm of uncertain behavior of skin: Secondary | ICD-10-CM | POA: Diagnosis not present

## 2018-04-15 DIAGNOSIS — H0014 Chalazion left upper eyelid: Secondary | ICD-10-CM | POA: Diagnosis not present

## 2018-04-15 DIAGNOSIS — G5603 Carpal tunnel syndrome, bilateral upper limbs: Secondary | ICD-10-CM | POA: Diagnosis not present

## 2018-04-15 DIAGNOSIS — H0011 Chalazion right upper eyelid: Secondary | ICD-10-CM | POA: Diagnosis not present

## 2018-04-15 DIAGNOSIS — H0015 Chalazion left lower eyelid: Secondary | ICD-10-CM | POA: Diagnosis not present

## 2018-04-15 DIAGNOSIS — H0012 Chalazion right lower eyelid: Secondary | ICD-10-CM | POA: Diagnosis not present

## 2018-04-15 DIAGNOSIS — D210 Benign neoplasm of connective and other soft tissue of head, face and neck: Secondary | ICD-10-CM | POA: Diagnosis not present

## 2018-04-27 ENCOUNTER — Other Ambulatory Visit: Payer: Self-pay | Admitting: Family Medicine

## 2018-05-03 DIAGNOSIS — H01009 Unspecified blepharitis unspecified eye, unspecified eyelid: Secondary | ICD-10-CM | POA: Diagnosis not present

## 2018-05-03 DIAGNOSIS — M9904 Segmental and somatic dysfunction of sacral region: Secondary | ICD-10-CM | POA: Diagnosis not present

## 2018-05-03 DIAGNOSIS — M5137 Other intervertebral disc degeneration, lumbosacral region: Secondary | ICD-10-CM | POA: Diagnosis not present

## 2018-05-03 DIAGNOSIS — M9905 Segmental and somatic dysfunction of pelvic region: Secondary | ICD-10-CM | POA: Diagnosis not present

## 2018-05-03 DIAGNOSIS — M9903 Segmental and somatic dysfunction of lumbar region: Secondary | ICD-10-CM | POA: Diagnosis not present

## 2018-05-10 ENCOUNTER — Other Ambulatory Visit: Payer: Self-pay | Admitting: Ophthalmology

## 2018-05-10 DIAGNOSIS — H0014 Chalazion left upper eyelid: Secondary | ICD-10-CM | POA: Diagnosis not present

## 2018-05-10 DIAGNOSIS — H0012 Chalazion right lower eyelid: Secondary | ICD-10-CM | POA: Diagnosis not present

## 2018-05-10 DIAGNOSIS — D485 Neoplasm of uncertain behavior of skin: Secondary | ICD-10-CM | POA: Diagnosis not present

## 2018-05-10 DIAGNOSIS — H0011 Chalazion right upper eyelid: Secondary | ICD-10-CM | POA: Diagnosis not present

## 2018-05-10 DIAGNOSIS — D210 Benign neoplasm of connective and other soft tissue of head, face and neck: Secondary | ICD-10-CM | POA: Diagnosis not present

## 2018-05-10 DIAGNOSIS — D23112 Other benign neoplasm of skin of right lower eyelid, including canthus: Secondary | ICD-10-CM | POA: Diagnosis not present

## 2018-05-10 DIAGNOSIS — L820 Inflamed seborrheic keratosis: Secondary | ICD-10-CM | POA: Diagnosis not present

## 2018-05-10 DIAGNOSIS — H0015 Chalazion left lower eyelid: Secondary | ICD-10-CM | POA: Diagnosis not present

## 2018-05-26 DIAGNOSIS — M5137 Other intervertebral disc degeneration, lumbosacral region: Secondary | ICD-10-CM | POA: Diagnosis not present

## 2018-05-26 DIAGNOSIS — M9904 Segmental and somatic dysfunction of sacral region: Secondary | ICD-10-CM | POA: Diagnosis not present

## 2018-05-26 DIAGNOSIS — M9903 Segmental and somatic dysfunction of lumbar region: Secondary | ICD-10-CM | POA: Diagnosis not present

## 2018-05-26 DIAGNOSIS — M9905 Segmental and somatic dysfunction of pelvic region: Secondary | ICD-10-CM | POA: Diagnosis not present

## 2018-05-31 DIAGNOSIS — M5137 Other intervertebral disc degeneration, lumbosacral region: Secondary | ICD-10-CM | POA: Diagnosis not present

## 2018-05-31 DIAGNOSIS — M9904 Segmental and somatic dysfunction of sacral region: Secondary | ICD-10-CM | POA: Diagnosis not present

## 2018-05-31 DIAGNOSIS — M9903 Segmental and somatic dysfunction of lumbar region: Secondary | ICD-10-CM | POA: Diagnosis not present

## 2018-05-31 DIAGNOSIS — M9905 Segmental and somatic dysfunction of pelvic region: Secondary | ICD-10-CM | POA: Diagnosis not present

## 2018-06-08 ENCOUNTER — Other Ambulatory Visit: Payer: Self-pay | Admitting: Family Medicine

## 2018-06-22 DIAGNOSIS — M9903 Segmental and somatic dysfunction of lumbar region: Secondary | ICD-10-CM | POA: Diagnosis not present

## 2018-06-22 DIAGNOSIS — M5137 Other intervertebral disc degeneration, lumbosacral region: Secondary | ICD-10-CM | POA: Diagnosis not present

## 2018-06-22 DIAGNOSIS — M9905 Segmental and somatic dysfunction of pelvic region: Secondary | ICD-10-CM | POA: Diagnosis not present

## 2018-06-22 DIAGNOSIS — M9904 Segmental and somatic dysfunction of sacral region: Secondary | ICD-10-CM | POA: Diagnosis not present

## 2018-06-22 IMAGING — CT CT RENAL STONE PROTOCOL
2 of 4 series · 15 of 46 positions shown, 17 images · non-contrast
Comparison: January 16, 2017

CLINICAL DATA: Flank pain and syncope. Recent double-J stent
placement on the left

EXAM:
CT ABDOMEN AND PELVIS WITHOUT CONTRAST
TECHNIQUE: Multidetector CT imaging of the abdomen and pelvis was performed
following the standard protocol without oral or intravenous contrast
material administration.

[Series 2: axial st · axial · 0.77mm/px · z∈[-522,-72]mm · 12 of 102 slices shown, 14 images]
[im 6/102  soft-tissue]
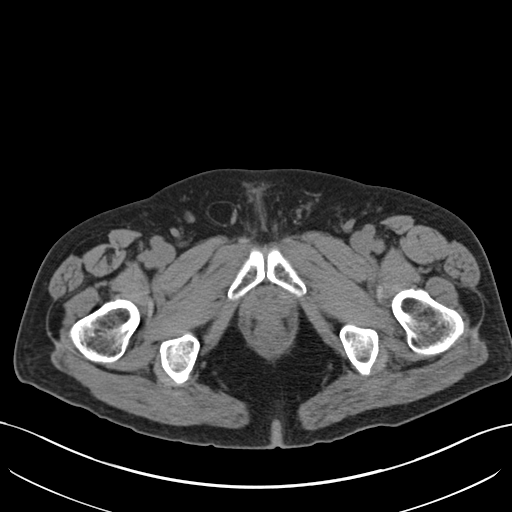
[im 6/102  bone]
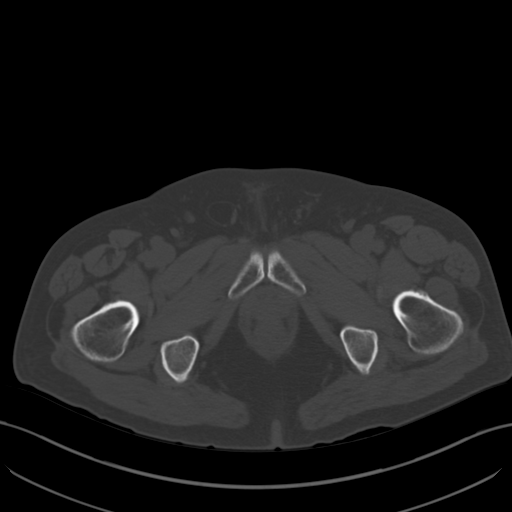
[im 18/102  soft-tissue]
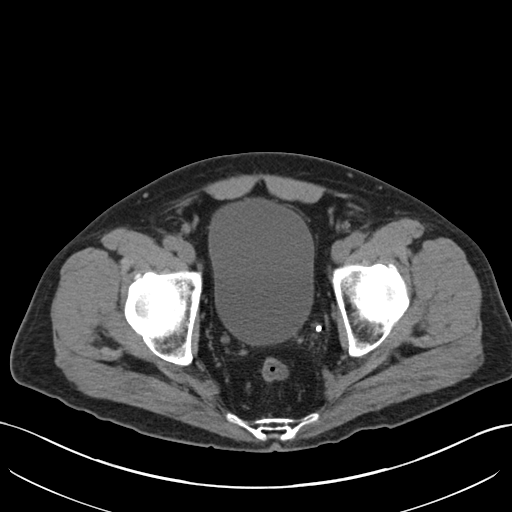
[im 24/102  soft-tissue]
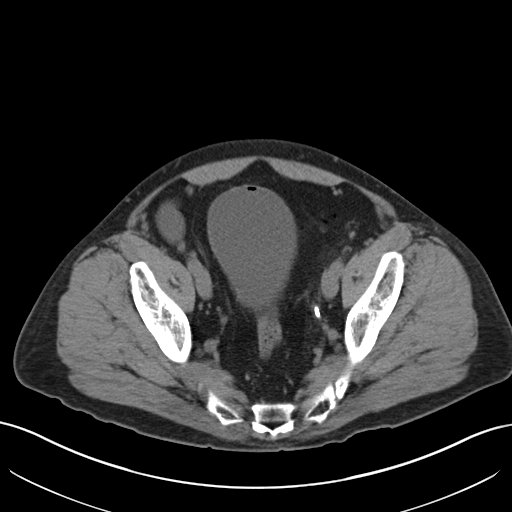
[im 30/102  soft-tissue]
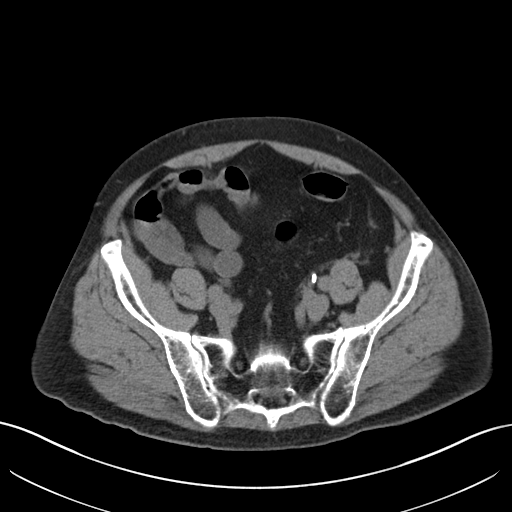
[im 42/102  soft-tissue]
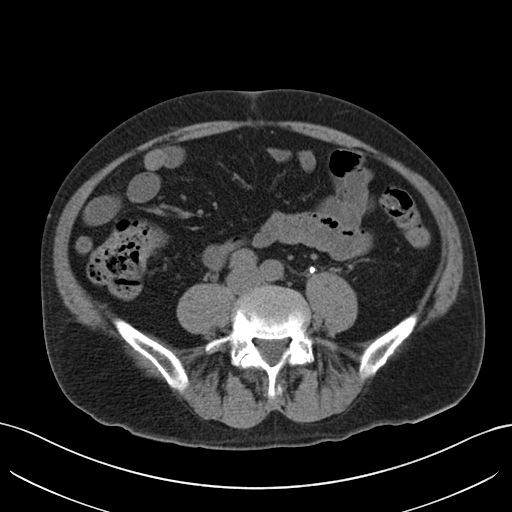
[im 48/102  soft-tissue]
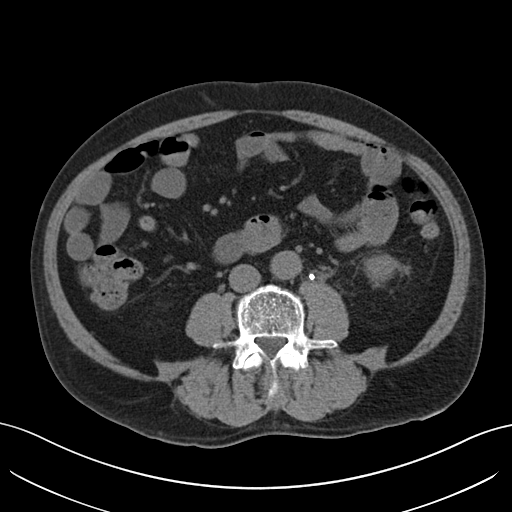
[im 54/102  soft-tissue]
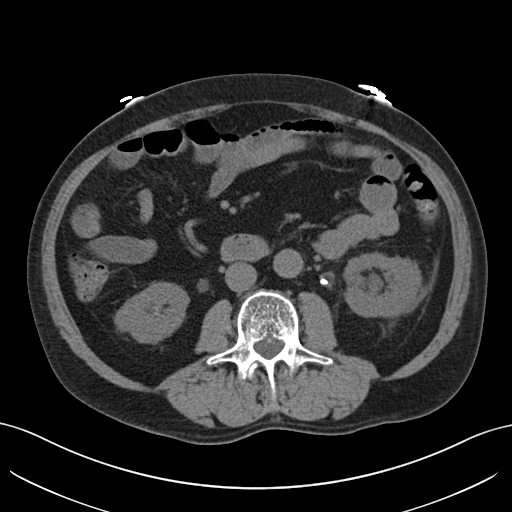
[im 66/102  soft-tissue]
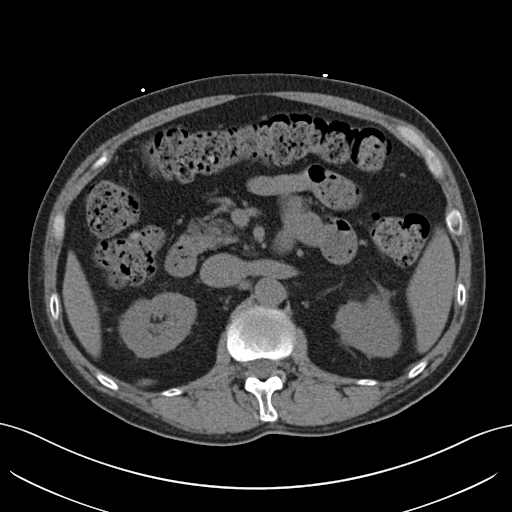
[im 72/102  soft-tissue]
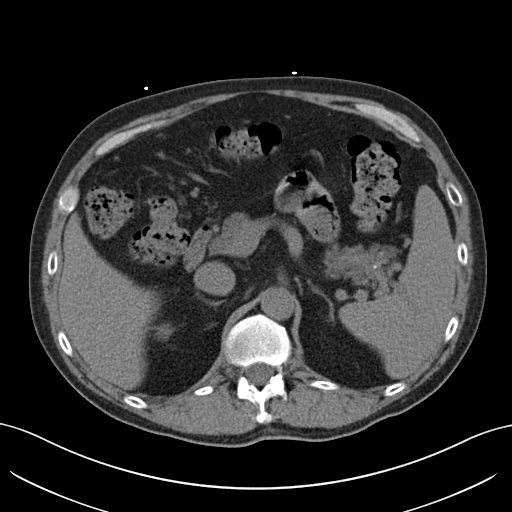
[im 72/102  bone]
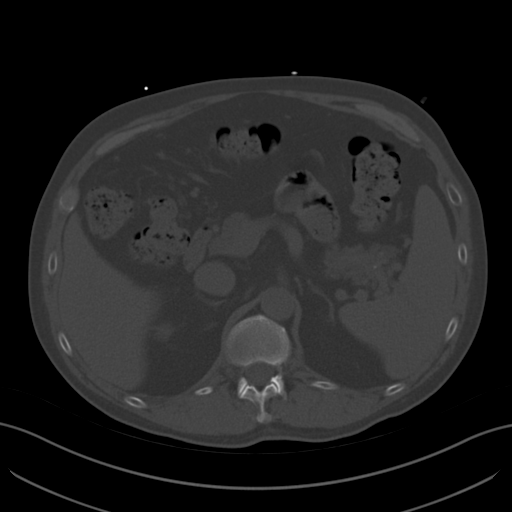
[im 78/102  soft-tissue]
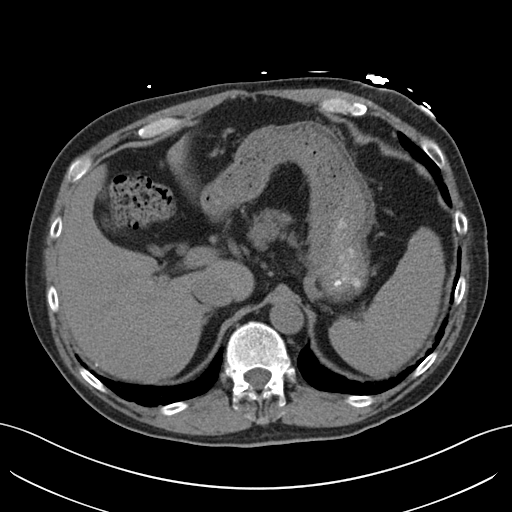
[im 90/102  soft-tissue]
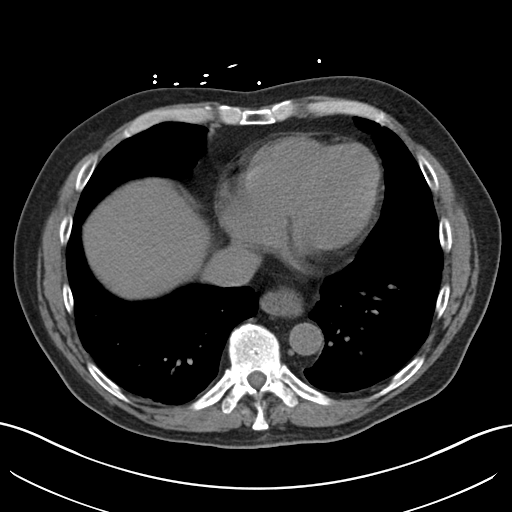
[im 96/102  soft-tissue]
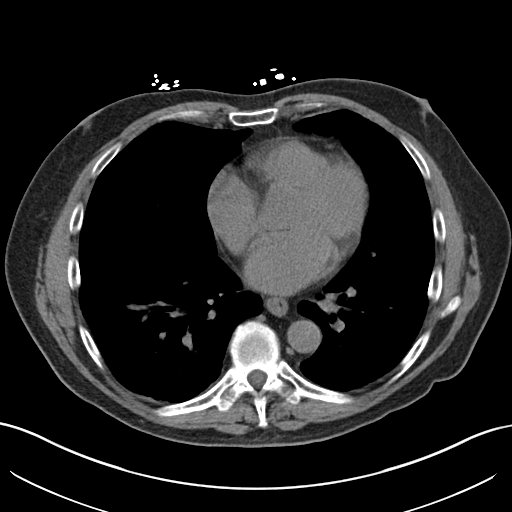

[Series 5: coronal · coronal · 0.82mm/px · 3 of 171 slices shown]
[im 57/171  soft-tissue]
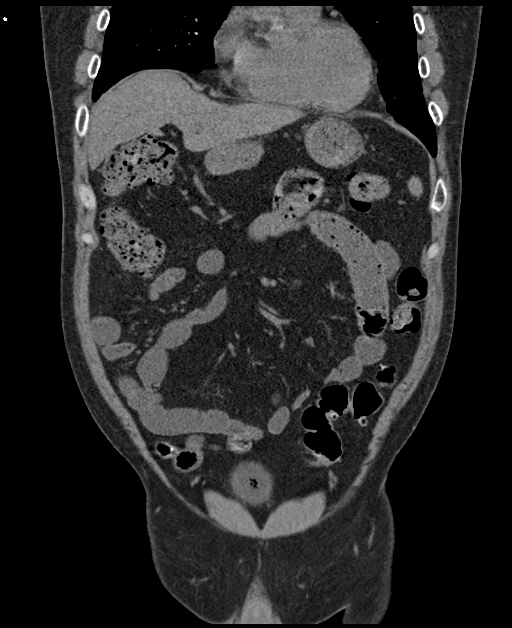
[im 76/171  soft-tissue]
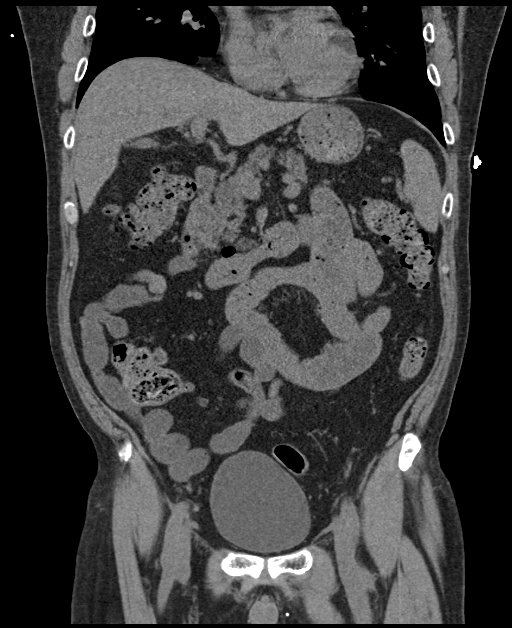
[im 95/171  soft-tissue]
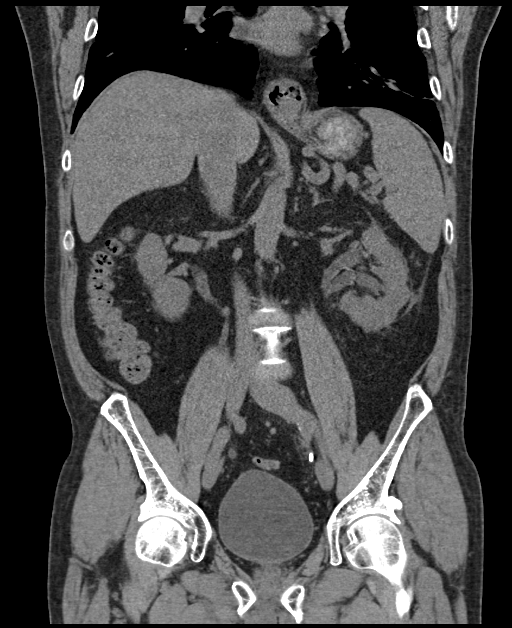

[15 of 46 positions shown; findings below may reference images not displayed]

FINDINGS: Lower chest: There is patchy atelectasis in both lower lobes. There
is lower lobe bronchiectatic change bilaterally. There are foci of
coronary artery calcification. There is a focal hiatal hernia.

Hepatobiliary: No focal liver lesions are evident on this
noncontrast enhanced study. Gallbladder wall is not appreciably
thickened. There is no biliary duct dilatation.

Pancreas: There is no pancreatic mass or inflammatory focus.

Spleen: Spleen measures 14.6 x 11.8 x 8.2 cm with a measured splenic
volume of 706 cubic cm. No focal splenic lesions are evident on this
noncontrast enhanced study.

Adrenals/Urinary Tract: Adrenals appear unremarkable bilaterally.
There is no renal mass on either side. Each kidney has an extrarenal
pelvis. There is a double-J stent extending from the left renal
pelvis to the bladder. There is slight hydronephrosis on the left.
There is no appreciable hydronephrosis on the right. There is a 1 mm
calculus in the upper pole left kidney. There is a 2 mm calculus in
the mid left kidney. In the lower pole region, there is a 1 mm
calculus with a nearby 3 mm calculus. No ureteral calculi are
evident on this study. Urinary bladder is midline with wall
thickness within normal limits. A small amount of air in the urinary
bladder is probably due to instrumentation.

Stomach/Bowel: There are scattered colonic diverticula without
diverticulitis. No bowel wall or mesenteric thickening is evident.
There is no appreciable bowel obstruction. No free air or portal
venous air evident.

Vascular/Lymphatic: Aorta is somewhat tortuous without aneurysm.
There are foci of calcification in each proximal iliac artery. Major
mesenteric vessels appear patent on this noncontrast enhanced study.
No adenopathy is appreciable in the abdomen or pelvis.

Reproductive: Prostate and seminal vesicles appear normal in size
and contour. No pelvic mass evident.

Other: Appendix appears normal. There is fat in each inguinal ring.
No abscess or ascites evident in the abdomen or pelvis.

Musculoskeletal: There are no blastic or lytic bone lesions. No
intramuscular or abdominal wall lesions.
IMPRESSION: 1. Slight hydronephrosis on the left. Double-J stent extends from
the left renal pelvis to the bladder. Nonobstructing calculi noted
in left kidney. No ureteral calculi noted on either side. No
hydronephrosis on the right.

2. Small amount of air in the urinary bladder is likely due to
instrumentation. Has gas-forming infectious organism could present
in this manner, correlation with urinalysis advised in this regard.

3.  Splenomegaly of uncertain etiology.

4.  No bowel obstruction.  No abscess.  Appendix appears normal.

5. There is a focal hiatal hernia. There is fat in each inguinal
ring.

6.  Iliac artery atherosclerosis noted.

7. Bibasilar lung atelectasis with lower lobe bronchiectatic change
bilaterally.

## 2018-06-22 IMAGING — CR DG CHEST 2V
2 series · 2 of 2 positions shown · non-contrast
Comparison: None.

CLINICAL DATA: Fever and left flank pain and to date. Left ureteral
stent placed 2 days ago.

EXAM:
CHEST  2 VIEW

[w chest lat]
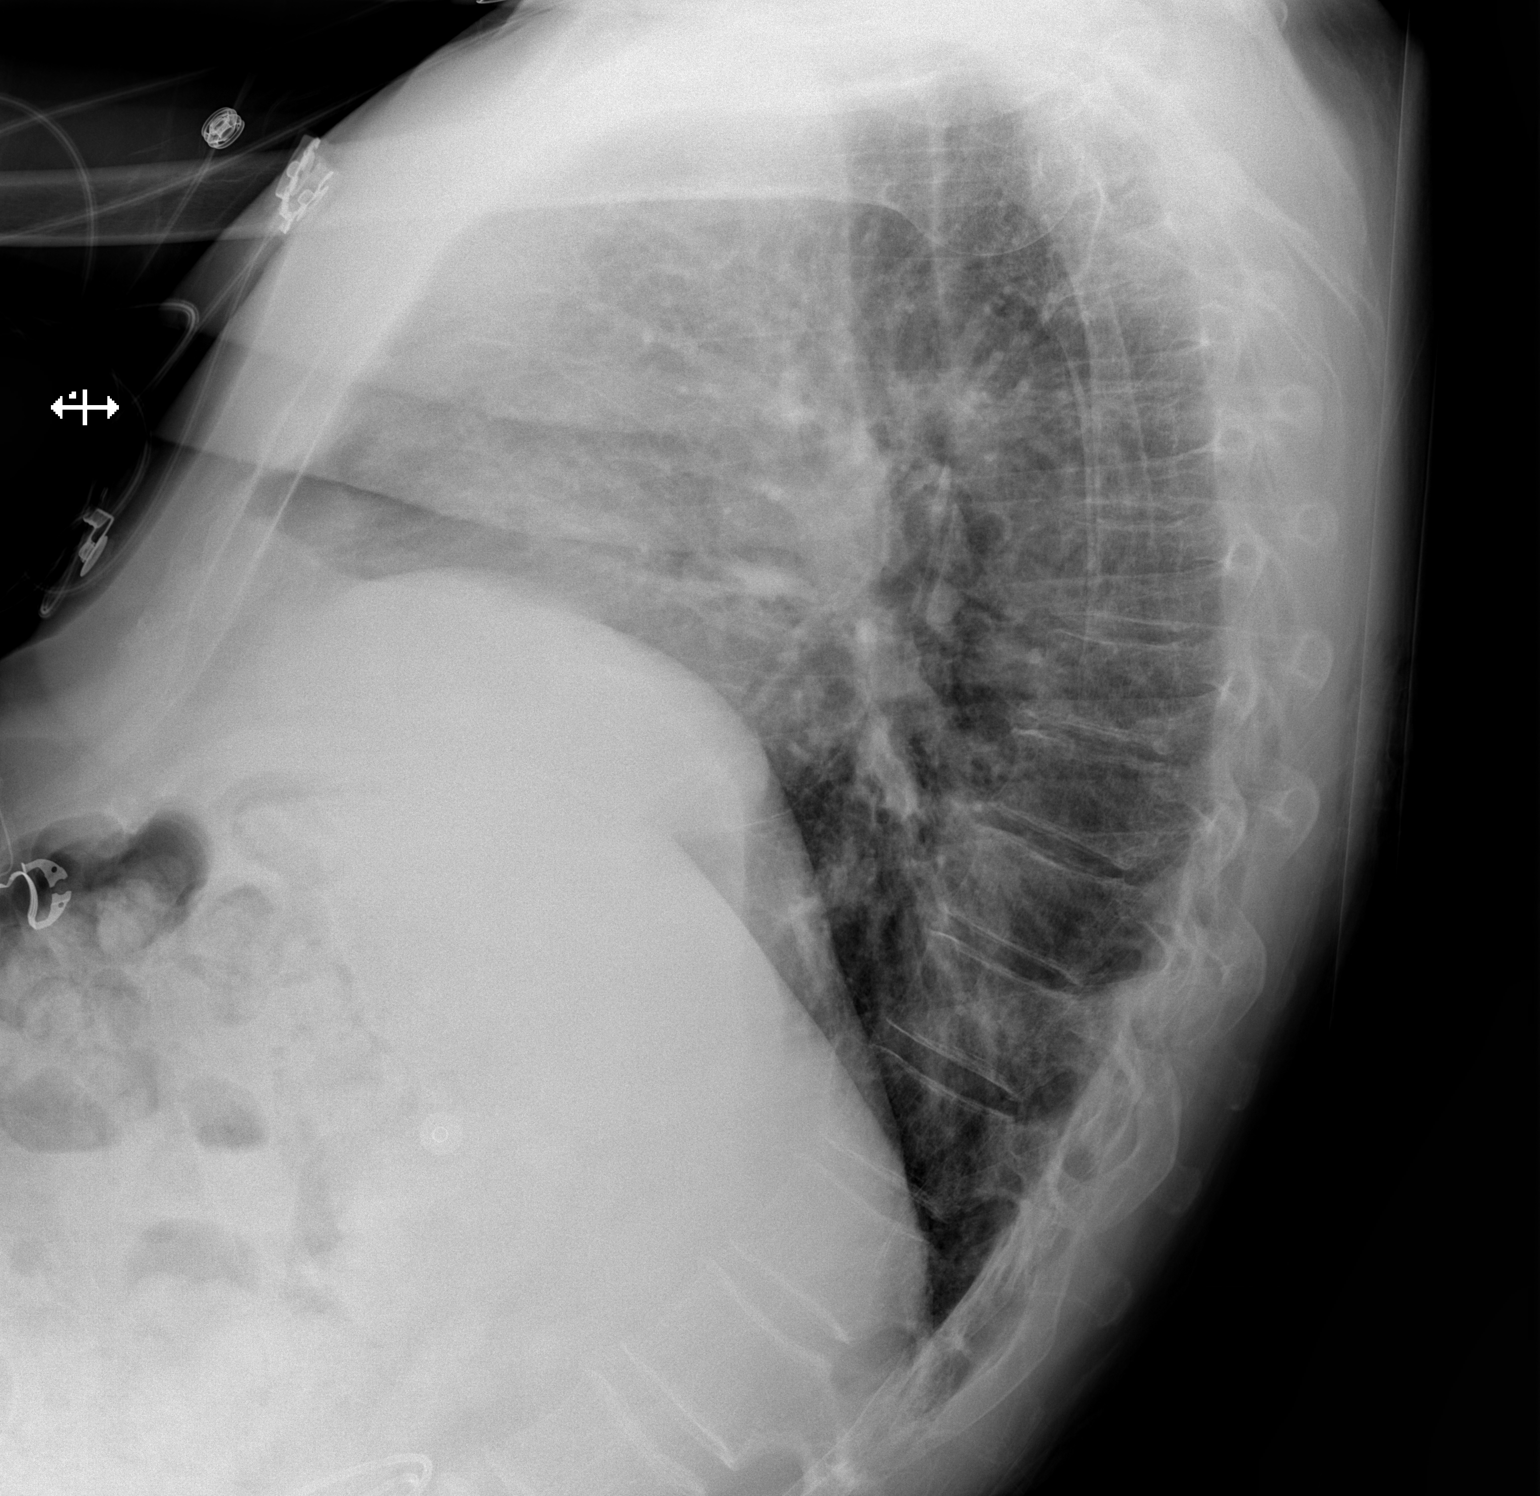

[x chest ap]
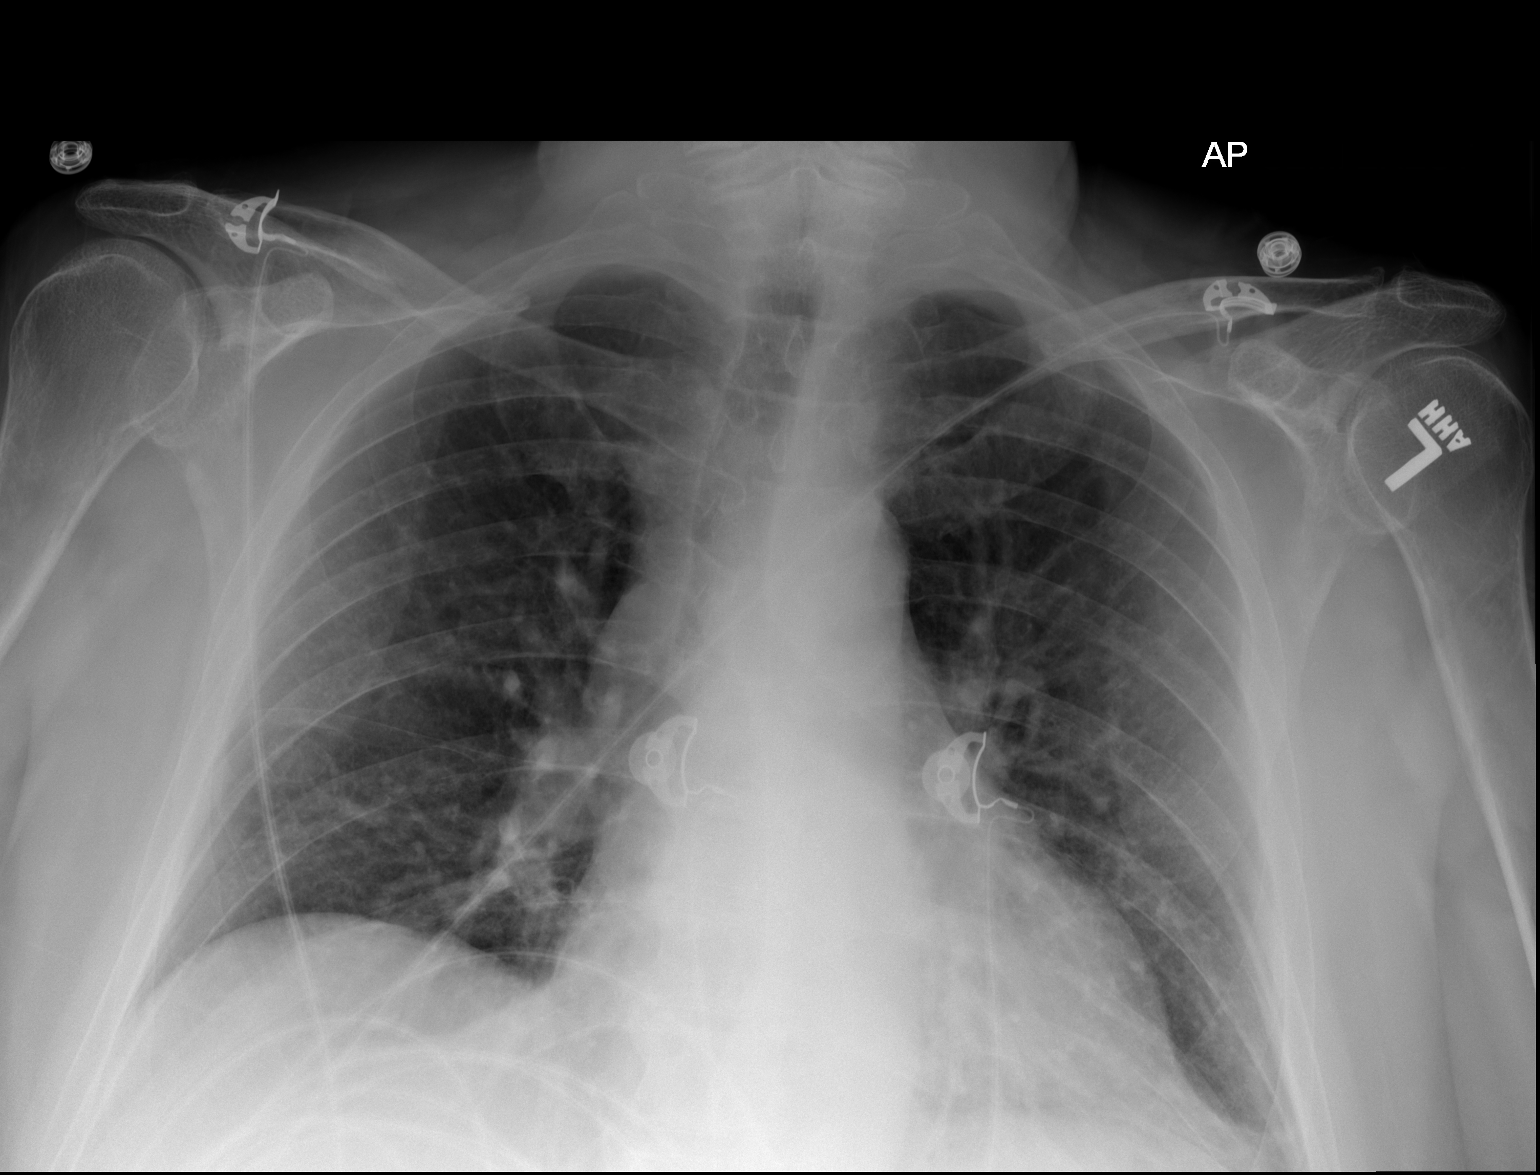

[2 of 2 positions shown; findings below may reference images not displayed]

FINDINGS: Heart size normal. Mild pulmonary vascular congestion is present
without frank edema. There no effusions. Asymmetric left lower lobe
airspace disease is present. Visualized soft tissues and bony thorax
are unremarkable. The top of the left double-J ureteral stent is
noted.
IMPRESSION: 1. Asymmetric left lower lobe airspace disease is concerning for
pneumonia.
2. Low lung volumes without other focal airspace disease.

## 2018-06-24 DIAGNOSIS — M9905 Segmental and somatic dysfunction of pelvic region: Secondary | ICD-10-CM | POA: Diagnosis not present

## 2018-06-24 DIAGNOSIS — M9903 Segmental and somatic dysfunction of lumbar region: Secondary | ICD-10-CM | POA: Diagnosis not present

## 2018-06-24 DIAGNOSIS — M5137 Other intervertebral disc degeneration, lumbosacral region: Secondary | ICD-10-CM | POA: Diagnosis not present

## 2018-06-24 DIAGNOSIS — M9904 Segmental and somatic dysfunction of sacral region: Secondary | ICD-10-CM | POA: Diagnosis not present

## 2018-06-29 DIAGNOSIS — M9903 Segmental and somatic dysfunction of lumbar region: Secondary | ICD-10-CM | POA: Diagnosis not present

## 2018-06-29 DIAGNOSIS — M5137 Other intervertebral disc degeneration, lumbosacral region: Secondary | ICD-10-CM | POA: Diagnosis not present

## 2018-06-29 DIAGNOSIS — M9904 Segmental and somatic dysfunction of sacral region: Secondary | ICD-10-CM | POA: Diagnosis not present

## 2018-06-29 DIAGNOSIS — M9905 Segmental and somatic dysfunction of pelvic region: Secondary | ICD-10-CM | POA: Diagnosis not present

## 2018-07-01 DIAGNOSIS — M9903 Segmental and somatic dysfunction of lumbar region: Secondary | ICD-10-CM | POA: Diagnosis not present

## 2018-07-01 DIAGNOSIS — M9904 Segmental and somatic dysfunction of sacral region: Secondary | ICD-10-CM | POA: Diagnosis not present

## 2018-07-01 DIAGNOSIS — M5137 Other intervertebral disc degeneration, lumbosacral region: Secondary | ICD-10-CM | POA: Diagnosis not present

## 2018-07-01 DIAGNOSIS — M9905 Segmental and somatic dysfunction of pelvic region: Secondary | ICD-10-CM | POA: Diagnosis not present

## 2018-07-08 ENCOUNTER — Other Ambulatory Visit: Payer: Self-pay | Admitting: Family Medicine

## 2018-07-17 ENCOUNTER — Other Ambulatory Visit: Payer: Self-pay | Admitting: Family Medicine

## 2018-07-22 DIAGNOSIS — M542 Cervicalgia: Secondary | ICD-10-CM | POA: Diagnosis not present

## 2018-07-22 DIAGNOSIS — M545 Low back pain: Secondary | ICD-10-CM | POA: Diagnosis not present

## 2018-07-22 DIAGNOSIS — R51 Headache: Secondary | ICD-10-CM | POA: Diagnosis not present

## 2018-07-22 DIAGNOSIS — G5622 Lesion of ulnar nerve, left upper limb: Secondary | ICD-10-CM | POA: Diagnosis not present

## 2018-07-22 DIAGNOSIS — G5603 Carpal tunnel syndrome, bilateral upper limbs: Secondary | ICD-10-CM | POA: Diagnosis not present

## 2018-07-22 DIAGNOSIS — G603 Idiopathic progressive neuropathy: Secondary | ICD-10-CM | POA: Diagnosis not present

## 2018-08-04 ENCOUNTER — Encounter: Payer: Self-pay | Admitting: Family Medicine

## 2018-08-04 ENCOUNTER — Ambulatory Visit (INDEPENDENT_AMBULATORY_CARE_PROVIDER_SITE_OTHER): Payer: Medicare Other | Admitting: Family Medicine

## 2018-08-04 VITALS — BP 115/80 | HR 81 | Wt 170.0 lb

## 2018-08-04 DIAGNOSIS — Z0000 Encounter for general adult medical examination without abnormal findings: Secondary | ICD-10-CM

## 2018-08-04 DIAGNOSIS — K589 Irritable bowel syndrome without diarrhea: Secondary | ICD-10-CM

## 2018-08-04 DIAGNOSIS — G609 Hereditary and idiopathic neuropathy, unspecified: Secondary | ICD-10-CM | POA: Diagnosis not present

## 2018-08-04 DIAGNOSIS — E785 Hyperlipidemia, unspecified: Secondary | ICD-10-CM | POA: Diagnosis not present

## 2018-08-04 DIAGNOSIS — J309 Allergic rhinitis, unspecified: Secondary | ICD-10-CM | POA: Diagnosis not present

## 2018-08-04 DIAGNOSIS — L219 Seborrheic dermatitis, unspecified: Secondary | ICD-10-CM | POA: Diagnosis not present

## 2018-08-04 DIAGNOSIS — Z0001 Encounter for general adult medical examination with abnormal findings: Secondary | ICD-10-CM

## 2018-08-04 DIAGNOSIS — F320 Major depressive disorder, single episode, mild: Secondary | ICD-10-CM

## 2018-08-04 DIAGNOSIS — F411 Generalized anxiety disorder: Secondary | ICD-10-CM

## 2018-08-04 MED ORDER — DESONIDE 0.05 % EX LOTN: TOPICAL_LOTION | CUTANEOUS | 2 refills | Status: DC

## 2018-08-04 MED ORDER — HYOSCYAMINE SULFATE 0.125 MG SL SUBL: 0.1250 mg | SUBLINGUAL_TABLET | Freq: Four times a day (QID) | SUBLINGUAL | 3 refills | Status: DC | PRN

## 2018-08-04 NOTE — Progress Notes (Signed)
Chief Complaint:  David Armstrong is a 68 y.o. male who presents today for a virtual subsequent Medicare Annual Wellness Visit and to discuss management of his chronic medical problems.  Assessment/Plan:  Seborrheic dermatitis Stable. Continue ketoconazole 2% as needed. Refill desonide 0.05%  IBS (irritable bowel syndrome) Stable. Refill hyoscyamine.   Peripheral neuropathy (HCC) Stable. Continue management per podiatry.   Dyslipidemia Stable. Continue lipitor 40mg  daily. Check lipid panel in 6 months.  Depression, major, single episode, mild (HCC) Stable. Continue management per psychiatry.   Anxiety state Stable. Continue management per psychiatry.   Preventative Healthcare Up to date on vaccines and screenings.   During the course of the visit the patient was educated and counseled about appropriate screening and preventive services including:        Fall prevention   Nutrition Physical Activity Weight Management Cognition    Subjective:  HPI:  Health Risk Assessment: Patient considers his overall health to be good. He has no difficulty performing the following: . Preparing food and eating . Bathing  . Getting dressed . Using the toilet . Shopping . Managing Finances . Moving around from place to place  He has not had any falls within the past year.   Depression screen [facility] 2/9 08/04/2018  Decreased Interest 2  Down, Depressed, Hopeless 2  PHQ - 2 Score 4  Altered sleeping 3  Tired, decreased energy 0  Change in appetite 0  Feeling bad or failure about yourself  1  Trouble concentrating 1  Moving slowly or fidgety/restless 1  Suicidal thoughts 0  PHQ-9 Score 10  Difficult doing work/chores Somewhat difficult   Lifestyle Factors: Diet: No specific diets or eating plans.  Exercise: Likes to go on walks.   Patient Care Team: Vivi Barrack, MD as PCP - General (Family Medicine) Michael Boston, MD as Consulting Physician (General Surgery)  Sable Feil, MD as Consulting Physician (Gastroenterology) Willia Craze, NP as Nurse Practitioner (Gastroenterology) [name], MD as Consulting Physician (Urology) [name], MD as Consulting Physician (Psychiatry)   His chronic medical conditions are outlined below:  # Dyslipidemia - On lipitor 40mg  daily and tolerating well - ROS: No reported myalgias  # Depression / Anxiety - Follows with psychiatry.  - On ativan 0.5mg  to 1mg  every 8 hours as needed - ROS: No reported SI or HI  # Seborrheic Dermatitis - Uses ketoconazole as needed and tolerating well  # IBS - On hyoscyamine every 6 hours as needed and tolerating well  ROS: Per HPI  PMH:  The following were reviewed and entered/updated in epic: Past Medical History:  Diagnosis Date  . Anxiety   . Arthritis    "lower back; hips;' right knee"   . Bipolar disorder ([place])   . Chronic constipation   . Depression   . Diverticulosis of colon   . Edema of both lower extremities    ANKLES  . Fatty liver   . GERD (gastroesophageal reflux disease)   . Hiatal hernia   . History of anal fissures   . History of deep vein thrombosis (DVT) of lower extremity 02/11/2016   right lower extremity distal and proximal extensive acute  . History of Helicobacter pylori infection 2012; 2009  . History of kidney stones   . History of pneumonia 01/2015   CAP  . 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 Hyperlipidemia   . IBS (irritable bowel syndrome)   . Left ureteral  stone   . Nocturia   . Peripheral neuropathy    per pt due to back DDD  . Personal history of colonic polyps    HYPERPLASTIC POLYP  . Pulmonary nodule, right    incidental finding CT chest angio 02-11-2016 stable  . TMJ (temporomandibular joint syndrome)   . Varicosities of leg    Past Surgical History:  Procedure Laterality Date  . BELPHAROPTOSIS REPAIR Bilateral 01/2015  . CATARACT EXTRACTION W/ INTRAOCULAR  LENS  IMPLANT, BILATERAL  04/2016  . COLONOSCOPY WITH ESOPHAGOGASTRODUODENOSCOPY (EGD)  last one 03-02-2013  . CYSTOSCOPY/RETROGRADE/URETEROSCOPY/STONE EXTRACTION WITH BASKET  2010  . CYSTOSCOPY/URETEROSCOPY/HOLMIUM LASER/STENT PLACEMENT Left 03/16/2017   Procedure: CYSTOSCOPY/RETROGRADE/URETEROSCOPY/HOLMIUM LASER/STENT PLACEMENT, STONE BASKET EXTRACTION AND STENT PLACEMENT;  Surgeon: [name], MD;  Location: [facility];  Service: Urology;  Laterality: Left;  . EXCISIONAL HEMORRHOIDECTOMY  2015 approx.  Marland Kitchen EXTRACORPOREAL SHOCK WAVE LITHOTRIPSY  1987; 1992  . FOOT NEUROMA SURGERY Bilateral left 09/2015; right 04/ 2017   morton' neuroma  . FOOT NEUROMA SURGERY Right 2004   morton's neuroma and removal heel spur  . HAMMER TOE SURGERY Left 2013   5th toe  . INCISION AND DRAINAGE FOOT Left 1992   "ball of foot; stepped on piece of hard wire & it went thru my shoe"  . KNEE ARTHROSCOPY Bilateral left x2 1997;  right 2004 & 2005  . NASAL FRACTURE SURGERY  1997  . PATELLA-FEMORAL ARTHROPLASTY Right 07-06-2003   dr Eddie Dibbles [facility]  . PLANTAR FASCIA RELEASE Bilateral 1987 & 1993  . RHINOPLASTY  2000  . SHOULDER ARTHROSCOPY Left 2012;  2013  . SHOULDER ARTHROSCOPY WITH DISTAL CLAVICLE RESECTION Right 07-31-2005   dr Percell Miller  [facility]   w/ Debridement labral and adhesions, acromioplasty, bursectomy, and CA ligament release  . TRANSTHORACIC ECHOCARDIOGRAM  02/12/2016   ef 29-93%, grade 1 diastolic dysfunction/  trivial TR   Family History  Problem Relation Age of Onset  . Heart disease Mother   . Stroke Mother   . Cirrhosis Father   . Heart disease Father   . Skin cancer Father   . Prostate cancer Maternal Grandfather   . Parkinson's disease Paternal Grandfather   . Heart disease Brother   . Colon cancer Neg Hx   . Stomach cancer Neg Hx     Medications- reviewed and updated Current Outpatient Medications  Medication Sig Dispense Refill  . acyclovir ointment (ZOVIRAX) 5 % Apply 1  application topically every 3 (three) hours. 30 g 0  . aspirin EC 81 MG tablet Take 81 mg by mouth daily.    Marland Kitchen atorvastatin (LIPITOR) 40 MG tablet Take 1 tablet (40 mg total) by mouth daily. 90 tablet 3  . desonide (DESOWEN) 0.05 % lotion APPLY EXTERNALLY TO THE AFFECTED AREA EVERY DAY AS NEEDED 118 mL 0  . fluticasone (FLONASE) 50 MCG/ACT nasal spray Place 2 sprays into both nostrils daily as needed for allergies or rhinitis. 17 g 11  . hyoscyamine (LEVSIN SL) 0.125 MG SL tablet DISSOLVE 1 TABLET UNDER THE TONGUE EVERY 6 HOURS AS NEEDED 30 tablet 3  . ketoconazole (NIZORAL) 2 % shampoo Apply 1 application topically 2 (two) times a week. Apply in shower, leave in for 10-15 minutes, then rinse. 120 mL 0  . LORazepam (ATIVAN) 1 MG tablet Take 0.5-1 tablets (0.5-1 mg total) by mouth every 8 (eight) hours as needed for anxiety. 90 tablet 0   No current facility-administered medications for this visit.     Allergies-reviewed and updated  Allergies  Allergen Reactions  . Effexor Xr [Venlafaxine Hcl Er] Other (See Comments)    Insomnia  . Acyclovir And Related   . Zocor [Simvastatin] Other (See Comments)    Muscle ache/ pain  . Dexilant [Dexlansoprazole] Other (See Comments)    Muscle aches  . Ivp Dye [Iodinated Diagnostic Agents] Rash  . Soma [Carisoprodol] Other (See Comments)    Sores on arm  . Sulfa Antibiotics Rash    Social History   Socioeconomic History  . Marital status: Divorced    Spouse name: Not on file  . Number of children: 0  . Years of education: Not on file  . Highest education level: Not on file  Occupational History  . Occupation: retired    Fish farm manager: [employer]    Comment: disabled now  Social Needs  . Financial resource strain: Not on file  . Food insecurity:    Worry: Not on file    Inability: Not on file  . Transportation needs:    Medical: Not on file    Non-medical: Not on file  Tobacco Use  . Smoking status: Never Smoker  . Smokeless tobacco:  Never Used  Substance and Sexual Activity  . Alcohol use: No    Alcohol/week: 0.0 standard drinks  . Drug use: No  . Sexual activity: Never  Lifestyle  . Physical activity:    Days per week: Not on file    Minutes per session: Not on file  . Stress: Not on file  Relationships  . Social connections:    Talks on phone: Not on file    Gets together: Not on file    Attends religious service: Not on file    Active member of club or organization: Not on file    Attends meetings of clubs or organizations: Not on file    Relationship status: Not on file  Other Topics Concern  . Not on file  Social History Narrative   Goes by "[name]"   Divorced   No children   Retired from the post office   Has internet girlfriend on [place] from [place]        Objective/Observations  Physical Exam: BP 115/80 (BP Location: Left Arm, Patient Position: Sitting, Cuff Size: Large)   Pulse 81   Wt 170 lb (77.1 kg)   BMI 23.06 kg/m  Wt Readings from Last 3 Encounters:  08/04/18 170 lb (77.1 kg)  02/05/18 169 lb 3.2 oz (76.7 kg)  12/04/17 170 lb 6.4 oz (77.3 kg)  Gen: NAD, resting comfortably Pulm: Normal work of breathing Neuro: Grossly normal, moves all extremities. No apparent cognitive impairment.  Psych: Normal affect and thought content  Virtual Visit via Video   I connected with David Armstrong on 08/04/18 at 11:00 AM EDT by a video enabled telemedicine application and verified that I am speaking with the correct person using two identifiers. I discussed the limitations of evaluation and management by telemedicine and the availability of in person appointments. The patient expressed understanding and agreed to proceed.   Patient location: Home Provider location: [place] participating in the virtual visit: Myself and Patient     [name]. [name], MD 08/04/2018 11:47 AM

## 2018-08-04 NOTE — Assessment & Plan Note (Signed)
Stable.  Continue management per psychiatry. 

## 2018-08-04 NOTE — Assessment & Plan Note (Signed)
Stable. Continue lipitor 40mg  daily. Check lipid panel in 6 months.

## 2018-08-04 NOTE — Assessment & Plan Note (Signed)
Stable. Continue management per podiatry.

## 2018-08-04 NOTE — Assessment & Plan Note (Signed)
Stable. Refill hyoscyamine.

## 2018-08-04 NOTE — Assessment & Plan Note (Signed)
Stable. Continue ketoconazole 2% as needed. Refill desonide 0.05%

## 2018-08-17 ENCOUNTER — Other Ambulatory Visit: Payer: Self-pay | Admitting: Family Medicine

## 2018-08-19 DIAGNOSIS — M9904 Segmental and somatic dysfunction of sacral region: Secondary | ICD-10-CM | POA: Diagnosis not present

## 2018-08-19 DIAGNOSIS — M9905 Segmental and somatic dysfunction of pelvic region: Secondary | ICD-10-CM | POA: Diagnosis not present

## 2018-08-19 DIAGNOSIS — M9903 Segmental and somatic dysfunction of lumbar region: Secondary | ICD-10-CM | POA: Diagnosis not present

## 2018-08-19 DIAGNOSIS — M5137 Other intervertebral disc degeneration, lumbosacral region: Secondary | ICD-10-CM | POA: Diagnosis not present

## 2018-08-25 DIAGNOSIS — H0234 Blepharochalasis left upper eyelid: Secondary | ICD-10-CM | POA: Diagnosis not present

## 2018-08-25 DIAGNOSIS — H57813 Brow ptosis, bilateral: Secondary | ICD-10-CM | POA: Diagnosis not present

## 2018-08-25 DIAGNOSIS — H019 Unspecified inflammation of eyelid: Secondary | ICD-10-CM | POA: Diagnosis not present

## 2018-08-25 DIAGNOSIS — H0014 Chalazion left upper eyelid: Secondary | ICD-10-CM | POA: Diagnosis not present

## 2018-08-25 DIAGNOSIS — H0231 Blepharochalasis right upper eyelid: Secondary | ICD-10-CM | POA: Diagnosis not present

## 2018-08-25 DIAGNOSIS — D210 Benign neoplasm of connective and other soft tissue of head, face and neck: Secondary | ICD-10-CM | POA: Diagnosis not present

## 2018-09-01 DIAGNOSIS — M5137 Other intervertebral disc degeneration, lumbosacral region: Secondary | ICD-10-CM | POA: Diagnosis not present

## 2018-09-01 DIAGNOSIS — M9905 Segmental and somatic dysfunction of pelvic region: Secondary | ICD-10-CM | POA: Diagnosis not present

## 2018-09-01 DIAGNOSIS — M9904 Segmental and somatic dysfunction of sacral region: Secondary | ICD-10-CM | POA: Diagnosis not present

## 2018-09-01 DIAGNOSIS — M9903 Segmental and somatic dysfunction of lumbar region: Secondary | ICD-10-CM | POA: Diagnosis not present

## 2018-09-08 DIAGNOSIS — L570 Actinic keratosis: Secondary | ICD-10-CM | POA: Diagnosis not present

## 2018-09-08 DIAGNOSIS — L821 Other seborrheic keratosis: Secondary | ICD-10-CM | POA: Diagnosis not present

## 2018-09-08 DIAGNOSIS — D1801 Hemangioma of skin and subcutaneous tissue: Secondary | ICD-10-CM | POA: Diagnosis not present

## 2018-09-09 DIAGNOSIS — M9904 Segmental and somatic dysfunction of sacral region: Secondary | ICD-10-CM | POA: Diagnosis not present

## 2018-09-09 DIAGNOSIS — M5137 Other intervertebral disc degeneration, lumbosacral region: Secondary | ICD-10-CM | POA: Diagnosis not present

## 2018-09-09 DIAGNOSIS — M9905 Segmental and somatic dysfunction of pelvic region: Secondary | ICD-10-CM | POA: Diagnosis not present

## 2018-09-09 DIAGNOSIS — M9903 Segmental and somatic dysfunction of lumbar region: Secondary | ICD-10-CM | POA: Diagnosis not present

## 2018-09-13 DIAGNOSIS — M9903 Segmental and somatic dysfunction of lumbar region: Secondary | ICD-10-CM | POA: Diagnosis not present

## 2018-09-13 DIAGNOSIS — M9904 Segmental and somatic dysfunction of sacral region: Secondary | ICD-10-CM | POA: Diagnosis not present

## 2018-09-13 DIAGNOSIS — M5137 Other intervertebral disc degeneration, lumbosacral region: Secondary | ICD-10-CM | POA: Diagnosis not present

## 2018-09-13 DIAGNOSIS — M9905 Segmental and somatic dysfunction of pelvic region: Secondary | ICD-10-CM | POA: Diagnosis not present

## 2018-09-15 DIAGNOSIS — H0014 Chalazion left upper eyelid: Secondary | ICD-10-CM | POA: Diagnosis not present

## 2018-09-15 DIAGNOSIS — H0012 Chalazion right lower eyelid: Secondary | ICD-10-CM | POA: Diagnosis not present

## 2018-09-15 DIAGNOSIS — D210 Benign neoplasm of connective and other soft tissue of head, face and neck: Secondary | ICD-10-CM | POA: Diagnosis not present

## 2018-09-20 DIAGNOSIS — G5603 Carpal tunnel syndrome, bilateral upper limbs: Secondary | ICD-10-CM | POA: Diagnosis not present

## 2018-09-20 DIAGNOSIS — G603 Idiopathic progressive neuropathy: Secondary | ICD-10-CM | POA: Diagnosis not present

## 2018-09-20 DIAGNOSIS — M5481 Occipital neuralgia: Secondary | ICD-10-CM | POA: Diagnosis not present

## 2018-09-20 DIAGNOSIS — M542 Cervicalgia: Secondary | ICD-10-CM | POA: Diagnosis not present

## 2018-09-20 DIAGNOSIS — M545 Low back pain: Secondary | ICD-10-CM | POA: Diagnosis not present

## 2018-09-29 DIAGNOSIS — H0014 Chalazion left upper eyelid: Secondary | ICD-10-CM | POA: Diagnosis not present

## 2018-09-29 DIAGNOSIS — H019 Unspecified inflammation of eyelid: Secondary | ICD-10-CM | POA: Diagnosis not present

## 2018-10-15 ENCOUNTER — Other Ambulatory Visit: Payer: Self-pay | Admitting: Family Medicine

## 2018-10-15 DIAGNOSIS — K219 Gastro-esophageal reflux disease without esophagitis: Secondary | ICD-10-CM

## 2018-10-26 ENCOUNTER — Other Ambulatory Visit: Payer: Self-pay | Admitting: Family Medicine

## 2018-10-27 DIAGNOSIS — M9903 Segmental and somatic dysfunction of lumbar region: Secondary | ICD-10-CM | POA: Diagnosis not present

## 2018-10-27 DIAGNOSIS — M5137 Other intervertebral disc degeneration, lumbosacral region: Secondary | ICD-10-CM | POA: Diagnosis not present

## 2018-10-27 DIAGNOSIS — M9905 Segmental and somatic dysfunction of pelvic region: Secondary | ICD-10-CM | POA: Diagnosis not present

## 2018-10-27 DIAGNOSIS — M9904 Segmental and somatic dysfunction of sacral region: Secondary | ICD-10-CM | POA: Diagnosis not present

## 2018-11-01 DIAGNOSIS — M5137 Other intervertebral disc degeneration, lumbosacral region: Secondary | ICD-10-CM | POA: Diagnosis not present

## 2018-11-01 DIAGNOSIS — M9903 Segmental and somatic dysfunction of lumbar region: Secondary | ICD-10-CM | POA: Diagnosis not present

## 2018-11-01 DIAGNOSIS — M9904 Segmental and somatic dysfunction of sacral region: Secondary | ICD-10-CM | POA: Diagnosis not present

## 2018-11-01 DIAGNOSIS — M9905 Segmental and somatic dysfunction of pelvic region: Secondary | ICD-10-CM | POA: Diagnosis not present

## 2018-11-08 DIAGNOSIS — M9903 Segmental and somatic dysfunction of lumbar region: Secondary | ICD-10-CM | POA: Diagnosis not present

## 2018-11-08 DIAGNOSIS — M9904 Segmental and somatic dysfunction of sacral region: Secondary | ICD-10-CM | POA: Diagnosis not present

## 2018-11-08 DIAGNOSIS — M5137 Other intervertebral disc degeneration, lumbosacral region: Secondary | ICD-10-CM | POA: Diagnosis not present

## 2018-11-08 DIAGNOSIS — M9905 Segmental and somatic dysfunction of pelvic region: Secondary | ICD-10-CM | POA: Diagnosis not present

## 2018-11-11 DIAGNOSIS — M9903 Segmental and somatic dysfunction of lumbar region: Secondary | ICD-10-CM | POA: Diagnosis not present

## 2018-11-11 DIAGNOSIS — M9904 Segmental and somatic dysfunction of sacral region: Secondary | ICD-10-CM | POA: Diagnosis not present

## 2018-11-11 DIAGNOSIS — M5137 Other intervertebral disc degeneration, lumbosacral region: Secondary | ICD-10-CM | POA: Diagnosis not present

## 2018-11-11 DIAGNOSIS — M9905 Segmental and somatic dysfunction of pelvic region: Secondary | ICD-10-CM | POA: Diagnosis not present

## 2018-11-15 DIAGNOSIS — L218 Other seborrheic dermatitis: Secondary | ICD-10-CM | POA: Diagnosis not present

## 2018-11-15 DIAGNOSIS — L570 Actinic keratosis: Secondary | ICD-10-CM | POA: Diagnosis not present

## 2018-11-15 DIAGNOSIS — L0292 Furuncle, unspecified: Secondary | ICD-10-CM | POA: Diagnosis not present

## 2018-11-15 DIAGNOSIS — L408 Other psoriasis: Secondary | ICD-10-CM | POA: Diagnosis not present

## 2018-11-17 DIAGNOSIS — M9903 Segmental and somatic dysfunction of lumbar region: Secondary | ICD-10-CM | POA: Diagnosis not present

## 2018-11-17 DIAGNOSIS — M9905 Segmental and somatic dysfunction of pelvic region: Secondary | ICD-10-CM | POA: Diagnosis not present

## 2018-11-17 DIAGNOSIS — M5137 Other intervertebral disc degeneration, lumbosacral region: Secondary | ICD-10-CM | POA: Diagnosis not present

## 2018-11-17 DIAGNOSIS — M9904 Segmental and somatic dysfunction of sacral region: Secondary | ICD-10-CM | POA: Diagnosis not present

## 2018-11-22 ENCOUNTER — Other Ambulatory Visit: Payer: Self-pay | Admitting: Family Medicine

## 2018-11-22 DIAGNOSIS — M5137 Other intervertebral disc degeneration, lumbosacral region: Secondary | ICD-10-CM | POA: Diagnosis not present

## 2018-11-22 DIAGNOSIS — M9904 Segmental and somatic dysfunction of sacral region: Secondary | ICD-10-CM | POA: Diagnosis not present

## 2018-11-22 DIAGNOSIS — M9903 Segmental and somatic dysfunction of lumbar region: Secondary | ICD-10-CM | POA: Diagnosis not present

## 2018-11-22 DIAGNOSIS — M9905 Segmental and somatic dysfunction of pelvic region: Secondary | ICD-10-CM | POA: Diagnosis not present

## 2018-11-24 DIAGNOSIS — H019 Unspecified inflammation of eyelid: Secondary | ICD-10-CM | POA: Diagnosis not present

## 2018-11-24 DIAGNOSIS — H0015 Chalazion left lower eyelid: Secondary | ICD-10-CM | POA: Diagnosis not present

## 2018-11-24 DIAGNOSIS — H0014 Chalazion left upper eyelid: Secondary | ICD-10-CM | POA: Diagnosis not present

## 2018-11-25 DIAGNOSIS — M542 Cervicalgia: Secondary | ICD-10-CM | POA: Diagnosis not present

## 2018-11-25 DIAGNOSIS — G603 Idiopathic progressive neuropathy: Secondary | ICD-10-CM | POA: Diagnosis not present

## 2018-11-25 DIAGNOSIS — M5481 Occipital neuralgia: Secondary | ICD-10-CM | POA: Diagnosis not present

## 2018-11-25 DIAGNOSIS — G5603 Carpal tunnel syndrome, bilateral upper limbs: Secondary | ICD-10-CM | POA: Diagnosis not present

## 2018-11-25 DIAGNOSIS — M545 Low back pain: Secondary | ICD-10-CM | POA: Diagnosis not present

## 2018-12-22 DIAGNOSIS — K648 Other hemorrhoids: Secondary | ICD-10-CM | POA: Diagnosis not present

## 2018-12-23 DIAGNOSIS — G5623 Lesion of ulnar nerve, bilateral upper limbs: Secondary | ICD-10-CM | POA: Diagnosis not present

## 2018-12-23 DIAGNOSIS — M5412 Radiculopathy, cervical region: Secondary | ICD-10-CM | POA: Diagnosis not present

## 2018-12-23 DIAGNOSIS — G603 Idiopathic progressive neuropathy: Secondary | ICD-10-CM | POA: Diagnosis not present

## 2018-12-23 DIAGNOSIS — M5417 Radiculopathy, lumbosacral region: Secondary | ICD-10-CM | POA: Diagnosis not present

## 2018-12-23 DIAGNOSIS — G5603 Carpal tunnel syndrome, bilateral upper limbs: Secondary | ICD-10-CM | POA: Diagnosis not present

## 2018-12-23 DIAGNOSIS — M5481 Occipital neuralgia: Secondary | ICD-10-CM | POA: Diagnosis not present

## 2018-12-23 DIAGNOSIS — R51 Headache: Secondary | ICD-10-CM | POA: Diagnosis not present

## 2018-12-29 ENCOUNTER — Ambulatory Visit (HOSPITAL_COMMUNITY)
Admission: RE | Admit: 2018-12-29 | Discharge: 2018-12-29 | Disposition: A | Discharge status: Home / Self Care | Payer: Medicare Other | Source: Ambulatory Visit | Attending: Family | Admitting: Family

## 2018-12-29 ENCOUNTER — Encounter: Payer: Self-pay | Admitting: Physician Assistant

## 2018-12-29 ENCOUNTER — Ambulatory Visit (INDEPENDENT_AMBULATORY_CARE_PROVIDER_SITE_OTHER): Payer: Medicare Other | Admitting: Physician Assistant

## 2018-12-29 ENCOUNTER — Other Ambulatory Visit: Payer: Self-pay

## 2018-12-29 VITALS — BP 110/70 | HR 78 | Temp 98.1°F | Ht 72.0 in | Wt 168.0 lb

## 2018-12-29 DIAGNOSIS — M79661 Pain in right lower leg: Secondary | ICD-10-CM

## 2018-12-29 DIAGNOSIS — L989 Disorder of the skin and subcutaneous tissue, unspecified: Secondary | ICD-10-CM

## 2018-12-29 MED ORDER — RIVAROXABAN 15 MG PO TABS: 15.0000 mg | ORAL_TABLET | Freq: Two times a day (BID) | ORAL | 0 refills | Status: DC

## 2018-12-29 MED ORDER — KETOCONAZOLE 2 % EX CREA: TOPICAL_CREAM | CUTANEOUS | 0 refills | Status: DC

## 2018-12-29 MED ORDER — KETOCONAZOLE 2 % EX SHAM: MEDICATED_SHAMPOO | CUTANEOUS | 0 refills | Status: DC

## 2018-12-29 NOTE — Progress Notes (Signed)
David Armstrong is a 68 y.o. male here for a new problem.  I acted as a Education administrator for [employer], PA-C Anselmo Pickler, LPN  History of Present Illness:   Chief Complaint  Patient presents with  . lesion on scalp  . Calf pain    HPI   Lesion on scalp Pt c/o red lesion on scalp right temporal area x 2 weeks. Area red, scaly and dry with itching. Tried Nizoral shampoo only once.  He does have a history of seborrheic dermatitis.  He states that he does see a dermatologist, but has not been able to see them for this issue.  Denies any open lesions.  Calf pain Pt c/o pain right calf x 2 months and some edema.  He states that this pain is reminiscent of his history of DVT in 2017.  At that time he had an extensive right lower extremity DVT from his femoral vein all the way through his posterior tibial vein.  He ended up following up with Dr. Anice Paganini at [facility] for this.  He was initially put on Xarelto, per patient report and was recommended to stay on this for up to 1 year for the vascular specialist.  Per most recent note from Dr. Anice Paganini, in June 2018, he was told that he could stop his Xarelto in October 2018, and be maintained on baby aspirin.  He has not seen this provider since.  He denies any chest pain, shortness of breath, diaphoresis, nausea, vomiting..  Past Medical History:  Diagnosis Date  . Anxiety   . Arthritis    "lower back; hips;' right knee"   . Bipolar disorder ([place])   . Chronic constipation   . Depression   . Diverticulosis of colon   . Edema of both lower extremities    ANKLES  . Fatty liver   . GERD (gastroesophageal reflux disease)   . Hiatal hernia   . History of anal fissures   . History of deep vein thrombosis (DVT) of lower extremity 02/11/2016   right lower extremity distal and proximal extensive acute  . History of Helicobacter pylori infection 2012; 2009  . History of kidney stones   . History of pneumonia 01/2015   CAP  .  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 Hyperlipidemia   . IBS (irritable bowel syndrome)   . Left ureteral stone   . Nocturia   . Peripheral neuropathy    per pt due to back DDD  . Personal history of colonic polyps    HYPERPLASTIC POLYP  . Pulmonary nodule, right    incidental finding CT chest angio 02-11-2016 stable  . TMJ (temporomandibular joint syndrome)   . Varicosities of leg      Social History   Socioeconomic History  . Marital status: Divorced    Spouse name: Not on file  . Number of children: 0  . Years of education: Not on file  . Highest education level: Not on file  Occupational History  . Occupation: retired    Fish farm manager: [employer]    Comment: disabled now  Social Needs  . Financial resource strain: Not on file  . Food insecurity    Worry: Not on file    Inability: Not on file  . Transportation needs    Medical: Not on file    Non-medical: Not on file  Tobacco Use  . Smoking status: Never Smoker  . Smokeless tobacco: Never  Used  Substance and Sexual Activity  . Alcohol use: No    Alcohol/week: 0.0 standard drinks  . Drug use: No  . Sexual activity: Never  Lifestyle  . Physical activity    Days per week: Not on file    Minutes per session: Not on file  . Stress: Not on file  Relationships  . Social Herbalist on phone: Not on file    Gets together: Not on file    Attends religious service: Not on file    Active member of club or organization: Not on file    Attends meetings of clubs or organizations: Not on file    Relationship status: Not on file  . Intimate partner violence    Fear of current or ex partner: Not on file    Emotionally abused: Not on file    Physically abused: Not on file    Forced sexual activity: Not on file  Other Topics Concern  . Not on file  Social History Narrative   Goes by "David Armstrong"   Divorced   No children   Retired from the post office   Has internet girlfriend on  [place] from [place]    Past Surgical History:  Procedure Laterality Date  . BELPHAROPTOSIS REPAIR Bilateral 01/2015  . CATARACT EXTRACTION W/ INTRAOCULAR LENS  IMPLANT, BILATERAL  04/2016  . COLONOSCOPY WITH ESOPHAGOGASTRODUODENOSCOPY (EGD)  last one 03-02-2013  . CYSTOSCOPY/RETROGRADE/URETEROSCOPY/STONE EXTRACTION WITH BASKET  2010  . CYSTOSCOPY/URETEROSCOPY/HOLMIUM LASER/STENT PLACEMENT Left 03/16/2017   Procedure: CYSTOSCOPY/RETROGRADE/URETEROSCOPY/HOLMIUM LASER/STENT PLACEMENT, STONE BASKET EXTRACTION AND STENT PLACEMENT;  Surgeon: [name], MD;  Location: [facility];  Service: Urology;  Laterality: Left;  . EXCISIONAL HEMORRHOIDECTOMY  2015 approx.  Marland Kitchen EXTRACORPOREAL SHOCK WAVE LITHOTRIPSY  1987; 1992  . FOOT NEUROMA SURGERY Bilateral left 09/2015; right 04/ 2017   morton' neuroma  . FOOT NEUROMA SURGERY Right 2004   morton's neuroma and removal heel spur  . HAMMER TOE SURGERY Left 2013   5th toe  . INCISION AND DRAINAGE FOOT Left 1992   "ball of foot; stepped on piece of hard wire & it went thru my shoe"  . KNEE ARTHROSCOPY Bilateral left x2 1997;  right 2004 & 2005  . NASAL FRACTURE SURGERY  1997  . PATELLA-FEMORAL ARTHROPLASTY Right 07-06-2003   dr Eddie Dibbles [facility]  . PLANTAR FASCIA RELEASE Bilateral 1987 & 1993  . RHINOPLASTY  2000  . SHOULDER ARTHROSCOPY Left 2012;  2013  . SHOULDER ARTHROSCOPY WITH DISTAL CLAVICLE RESECTION Right 07-31-2005   dr Percell Miller  [facility]   w/ Debridement labral and adhesions, acromioplasty, bursectomy, and CA ligament release  . TRANSTHORACIC ECHOCARDIOGRAM  02/12/2016   ef Q000111Q, grade 1 diastolic dysfunction/  trivial TR    Family History  Problem Relation Age of Onset  . Heart disease Mother   . Stroke Mother   . Cirrhosis Father   . Heart disease Father   . Skin cancer Father   . Prostate cancer Maternal Grandfather   . Parkinson's disease Paternal Grandfather   . Heart disease Brother   . Colon cancer Neg Hx   . Stomach  cancer Neg Hx     Allergies  Allergen Reactions  . Effexor Xr [Venlafaxine Hcl Er] Other (See Comments)    Insomnia  . Acyclovir And Related   . Zocor [Simvastatin] Other (See Comments)    Muscle ache/ pain  . Dexilant [Dexlansoprazole] Other (See Comments)    Muscle aches  . Ivp Dye [Iodinated  Diagnostic Agents] Rash  . Soma [Carisoprodol] Other (See Comments)    Sores on arm  . Sulfa Antibiotics Rash    Current Medications:   Current Outpatient Medications:  .  desonide (DESOWEN) 0.05 % lotion, APPLY EXTERNALLY TO THE AFFECTED AREA EVERY DAY AS NEEDED, Disp: 118 mL, Rfl: 2 .  doxycycline (VIBRAMYCIN) 100 MG injection, doxycycline hyclate, Disp: , Rfl:  .  fluticasone (FLONASE) 50 MCG/ACT nasal spray, Place 2 sprays into both nostrils daily as needed for allergies or rhinitis., Disp: 17 g, Rfl: 11 .  hyoscyamine (LEVSIN SL) 0.125 MG SL tablet, Take 1 tablet (0.125 mg total) by mouth every 6 (six) hours as needed., Disp: 120 tablet, Rfl: 3 .  [START ON 12/30/2018] ketoconazole (NIZORAL) 2 % shampoo, Apply topically 2 (two) times a week., Disp: 120 mL, Rfl: 0 .  LORazepam (ATIVAN) 1 MG tablet, Take 0.5-1 tablets (0.5-1 mg total) by mouth every 8 (eight) hours as needed for anxiety., Disp: 90 tablet, Rfl: 0 .  omeprazole (PRILOSEC) 20 MG capsule, TAKE 1 CAPSULE BY MOUTH ONCE DAILY, Disp: 30 capsule, Rfl: 3 .  aspirin EC 81 MG tablet, Take 81 mg by mouth daily., Disp: , Rfl:  .  atorvastatin (LIPITOR) 40 MG tablet, Take 1 tablet (40 mg total) by mouth daily. (Patient not taking: Reported on 12/29/2018), Disp: 90 tablet, Rfl: 3 .  clobetasol (TEMOVATE) 0.05 % external solution, APPLY APPLICATION TOPICALLY TO THE SCALP BID. DO NOT USE ON THE FACE, Disp: , Rfl:  .  gentamicin ointment (GARAMYCIN) 0.1 %, APPLY TO WOUND 2 TIMES DAILY, Disp: , Rfl:  .  ketoconazole (NIZORAL) 2 % cream, Apply to affected area 1-2 times daily, Disp: 15 g, Rfl: 0 .  Rivaroxaban (XARELTO) 15 MG TABS tablet, Take 1  tablet (15 mg total) by mouth 2 (two) times daily with a meal., Disp: 7 tablet, Rfl: 0   Review of Systems:   ROS Negative unless otherwise specified per HPI.  Vitals:   Vitals:   12/29/18 1323  BP: 110/70  Pulse: 78  Temp: 98.1 F (36.7 C)  TempSrc: Temporal  SpO2: 97%  Weight: 168 lb (76.2 kg)  Height: 6' (1.829 m)     Body mass index is 22.78 kg/m.  Physical Exam:   Physical Exam Vitals signs and nursing note reviewed.  Constitutional:      General: He is not in acute distress.    Appearance: He is well-developed. He is not ill-appearing or toxic-appearing.  Cardiovascular:     Rate and Rhythm: Normal rate and regular rhythm.     Pulses: Normal pulses.     Heart sounds: Normal heart sounds, S1 normal and S2 normal.  Pulmonary:     Effort: Pulmonary effort is normal.     Breath sounds: Normal breath sounds.  Musculoskeletal:     Right lower leg: No edema.     Left lower leg: 2+ Edema present.     Comments: Warmth and redness to right posterior calf.  Tenderness with palpation.  Positive Homans sign on right side.  Skin:    General: Skin is warm and dry.     Comments: 2 discrete erythematous plaques, both approximately the size of a nickel, on right side of scalp.  No discharge present no tenderness to palpation.  Neurological:     Mental Status: He is alert.     GCS: GCS eye subscore is 4. GCS verbal subscore is 5. GCS motor subscore is 6.  Psychiatric:  Speech: Speech normal.        Behavior: Behavior normal. Behavior is cooperative.      Assessment and Plan:   Kaeleb was seen today for lesion on scalp and calf pain.  Diagnoses and all orders for this visit:  Right calf pain Given his presentation as well as his history, will go ahead and empirically treat with Xarelto 15 mg twice daily.  We did send him for stat ultrasound of bilateral lower extremities today.  He was given ER precautions for further evaluation if he develops any chest pain,  shortness of breath, or other concerning symptoms.  I am also going to have him follow-up with his primary care provider, Dr. [name], tomorrow, for further evaluation as patient has numerous questions regarding whether further imaging is needed, upcoming ligation procedure next week, and also to recheck vitals.  Patient verbalized understanding of plan was in agreement. -     US Venous Img Lower Bilateral; Future -     VAS [place] LOWER EXTREMITY VENOUS (DVT); Future  Skin lesion Unclear etiology.  It does appear to possibly be fungal, will have him trial ketoconazole cream.  He also asked for ketoconazole shampoo refill at this time.  Follow-up with dermatologist if no improvement of symptoms.  Other orders -     ketoconazole (NIZORAL) 2 % cream; Apply to affected area 1-2 times daily -     ketoconazole (NIZORAL) 2 % shampoo; Apply topically 2 (two) times a week. -     Rivaroxaban (XARELTO) 15 MG TABS tablet; Take 1 tablet (15 mg total) by mouth 2 (two) times daily with a meal.  . Reviewed expectations re: course of current medical issues. . Discussed self-management of symptoms. . Outlined signs and symptoms indicating need for more acute intervention. . Patient verbalized understanding and all questions were answered. . See orders for this visit as documented in the electronic medical record. . Patient received an After-Visit Summary.  CMA or LPN served as scribe during this visit. History, Physical, and Plan performed by medical provider. The above documentation has been reviewed and is accurate and complete.   Inda Coke, PA-C

## 2018-12-29 NOTE — Patient Instructions (Signed)
It was great to see you!  *May use ketoconazole ointment on your rash. I have also refilled Nizoral for you per your request.  1. Start Xarelto 15 mg twice a day 2. Please get doppler ultrasound as we discussed 3. Follow-up with Dr. [name] tomorrow or Friday for further evaluation  If you develop severe shortness of breath, uncontrolled fevers, coughing up blood, confusion, chest pain, or other concerning symptoms --> GO TO THE ER.  Take care,  Inda Coke PA-C

## 2018-12-30 ENCOUNTER — Encounter: Payer: Self-pay | Admitting: Family Medicine

## 2018-12-30 ENCOUNTER — Ambulatory Visit (INDEPENDENT_AMBULATORY_CARE_PROVIDER_SITE_OTHER): Payer: Medicare Other | Admitting: Family Medicine

## 2018-12-30 VITALS — BP 108/70 | HR 66 | Temp 97.7°F | Ht 72.0 in | Wt 168.5 lb

## 2018-12-30 DIAGNOSIS — L219 Seborrheic dermatitis, unspecified: Secondary | ICD-10-CM

## 2018-12-30 DIAGNOSIS — M79661 Pain in right lower leg: Secondary | ICD-10-CM

## 2018-12-30 DIAGNOSIS — F411 Generalized anxiety disorder: Secondary | ICD-10-CM

## 2018-12-30 MED ORDER — METHYLPREDNISOLONE ACETATE 80 MG/ML IJ SUSP
80.0000 mg | Freq: Once | INTRAMUSCULAR | Status: AC
  Administered 2018-12-30: 80 mg via INTRAMUSCULAR

## 2018-12-30 MED ORDER — KETOROLAC TROMETHAMINE 60 MG/2ML IM SOLN
60.0000 mg | Freq: Once | INTRAMUSCULAR | Status: AC
  Administered 2018-12-30: 60 mg via INTRAMUSCULAR

## 2018-12-30 NOTE — Progress Notes (Signed)
   Chief Complaint:  David Armstrong is a 68 y.o. male who presents today with a chief complaint of right calf pain.   Assessment/Plan:  Right Calf Pain [place] negative for DVT. Will stop xarelto. Likley calf strain. May have proximal achilles tendonitis. Will give 60mg  of toradol and 80mg  depo medrol. Discussed exercises and handout was given. Discussed reasons to return to care. Follow up   Anxiety state Stable. Continue management per psychiatry.   Seborrheic dermatitis Stable. Continue ketoconazole.     Subjective:  HPI:  Calf Pain Started a couple of months ago. Patient was seen yesterday by our PA.  There was concern for DVT and he was empirically started on Xarelto.  Ultrasound was reportedly negative for DVT yesterday. He stopped his xarelto. No obvious injuries.   His stable, chronic medical conditions are outlined below:  # Depression / Anxiety - Follows with psychiatry.  - On ativan 0.5mg  to 1mg  every 8 hours as needed - ROS: No reported SI or HI  # Seborrheic Dermatitis - Uses ketoconazole as needed and tolerating well  ROS: Per HPI  PMH: He reports that he has never smoked. He has never used smokeless tobacco. He reports that he does not drink alcohol or use drugs.      Objective:  Physical Exam: BP 108/70   Pulse 66   Temp 97.7 F (36.5 C)   Ht 6' (1.829 m)   Wt 168 lb 8 oz (76.4 kg)   SpO2 99%   BMI 22.85 kg/m   Gen: NAD, resting comfortably CV: Regular rate and rhythm with no murmurs appreciated Pulm: Normal work of breathing, clear to auscultation bilaterally with no crackles, wheezes, or rhonchi MSK: right calf with no deformities. Tender to palpation along proximal achilles tendon.  Skin: Warm, dry Neuro: Grossly normal, moves all extremities Psych: Normal affect and thought content      Sangeeta Youse M. [name], MD 12/30/2018 2:11 PM

## 2018-12-30 NOTE — Patient Instructions (Signed)
It was very nice to see you today!  We will give you an injection of two medications today. This should help with your calf pain.   Take care, Dr [name]  Please try these tips to maintain a healthy lifestyle:   Eat at least 3 REAL meals and 1-2 snacks per day.  Aim for no more than 5 hours between eating.  If you eat breakfast, please do so within one hour of getting up.    Obtain twice as many fruits/vegetables as protein or carbohydrate foods for both lunch and dinner. (Half of each meal should be fruits/vegetables, one quarter protein, and one quarter starchy carbs)   Cut down on sweet beverages. This includes juice, soda, and sweet tea.    Exercise at least 150 minutes every week.

## 2018-12-30 NOTE — Addendum Note (Signed)
Addended by: Loralyn Freshwater on: 12/30/2018 02:23 PM   Modules accepted: Orders

## 2018-12-30 NOTE — Assessment & Plan Note (Signed)
Stable.  Continue management per psychiatry. 

## 2018-12-30 NOTE — Assessment & Plan Note (Signed)
Stable. Continue ketoconazole.

## 2019-01-05 DIAGNOSIS — K648 Other hemorrhoids: Secondary | ICD-10-CM | POA: Diagnosis not present

## 2019-01-12 ENCOUNTER — Other Ambulatory Visit: Payer: Self-pay | Admitting: Physician Assistant

## 2019-01-12 DIAGNOSIS — D229 Melanocytic nevi, unspecified: Secondary | ICD-10-CM | POA: Diagnosis not present

## 2019-01-12 DIAGNOSIS — L439 Lichen planus, unspecified: Secondary | ICD-10-CM | POA: Diagnosis not present

## 2019-01-12 DIAGNOSIS — H61002 Unspecified perichondritis of left external ear: Secondary | ICD-10-CM | POA: Diagnosis not present

## 2019-01-12 DIAGNOSIS — L814 Other melanin hyperpigmentation: Secondary | ICD-10-CM | POA: Diagnosis not present

## 2019-01-12 DIAGNOSIS — D485 Neoplasm of uncertain behavior of skin: Secondary | ICD-10-CM | POA: Diagnosis not present

## 2019-01-12 DIAGNOSIS — L821 Other seborrheic keratosis: Secondary | ICD-10-CM | POA: Diagnosis not present

## 2019-01-14 NOTE — Telephone Encounter (Signed)
Pt called requesting 2 refills of this Rx

## 2019-01-20 ENCOUNTER — Other Ambulatory Visit: Payer: Self-pay | Admitting: Family Medicine

## 2019-01-20 DIAGNOSIS — M542 Cervicalgia: Secondary | ICD-10-CM | POA: Diagnosis not present

## 2019-01-20 DIAGNOSIS — M5481 Occipital neuralgia: Secondary | ICD-10-CM | POA: Diagnosis not present

## 2019-01-20 DIAGNOSIS — R519 Headache, unspecified: Secondary | ICD-10-CM | POA: Diagnosis not present

## 2019-01-20 DIAGNOSIS — M545 Low back pain: Secondary | ICD-10-CM | POA: Diagnosis not present

## 2019-01-20 DIAGNOSIS — G603 Idiopathic progressive neuropathy: Secondary | ICD-10-CM | POA: Diagnosis not present

## 2019-01-20 MED ORDER — KETOCONAZOLE 2 % EX CREA: TOPICAL_CREAM | CUTANEOUS | 0 refills | Status: DC

## 2019-01-20 NOTE — Telephone Encounter (Signed)
Copied from [place] [id number]. Topic: Quick Communication - Rx Refill/Question >> Jan 20, 2019  8:56 AM Leward Quan A wrote: Medication: ketoconazole (NIZORAL) 2 % cream   Patient request a larger tube if possible and additional refills  Has the patient contacted their pharmacy? Yes.   (Agent: If no, request that the patient contact the pharmacy for the refill.) (Agent: If yes, when and what did the pharmacy advise?)  Preferred Pharmacy (with phone number or street name): [id number] - [place], [place] [address] AT [phone] (Phone) [phone] (Fax)    Agent: Please be advised that RX refills may take up to 3 business days. We ask that you follow-up with your pharmacy.

## 2019-01-20 NOTE — Telephone Encounter (Signed)
Requested medication (s) are due for refill today: yes  Requested medication (s) are on the active medication list: yes  Last refill:  01/12/2019  Future visit scheduled: no  Notes to clinic:  Patient requesting a larger tube with refills   Requested Prescriptions  Pending Prescriptions Disp Refills   ketoconazole (NIZORAL) 2 % cream 15 g 0    Sig: APPLY EXTERNALLY TO THE AFFECTED AREA 1 TO 2 TIMES DAILY     Over the Counter:  OTC Passed - 01/20/2019  9:06 AM      Passed - Valid encounter within last 12 months    Recent Outpatient Visits          3 weeks ago Right calf pain   [facility], [name], MD   3 weeks ago Right calf pain   [facility] [place], [place], [place]   5 months ago Encounter for general adult medical examination with abnormal findings   [facility], [name], MD   11 months ago Urinary frequency   [facility], [name], MD   1 year ago Xerosis cutis   [facility] [address], [name], MD

## 2019-01-26 DIAGNOSIS — L400 Psoriasis vulgaris: Secondary | ICD-10-CM | POA: Diagnosis not present

## 2019-02-08 DIAGNOSIS — Z86010 Personal history of colonic polyps: Secondary | ICD-10-CM | POA: Diagnosis not present

## 2019-02-08 DIAGNOSIS — K6289 Other specified diseases of anus and rectum: Secondary | ICD-10-CM | POA: Diagnosis not present

## 2019-02-08 DIAGNOSIS — K625 Hemorrhage of anus and rectum: Secondary | ICD-10-CM | POA: Diagnosis not present

## 2019-02-24 DIAGNOSIS — N400 Enlarged prostate without lower urinary tract symptoms: Secondary | ICD-10-CM | POA: Diagnosis not present

## 2019-02-24 DIAGNOSIS — Z125 Encounter for screening for malignant neoplasm of prostate: Secondary | ICD-10-CM | POA: Diagnosis not present

## 2019-02-24 DIAGNOSIS — N200 Calculus of kidney: Secondary | ICD-10-CM | POA: Diagnosis not present

## 2019-03-03 DIAGNOSIS — K648 Other hemorrhoids: Secondary | ICD-10-CM | POA: Diagnosis not present

## 2019-03-03 DIAGNOSIS — K644 Residual hemorrhoidal skin tags: Secondary | ICD-10-CM | POA: Diagnosis not present

## 2019-03-03 DIAGNOSIS — K4090 Unilateral inguinal hernia, without obstruction or gangrene, not specified as recurrent: Secondary | ICD-10-CM | POA: Diagnosis not present

## 2019-03-03 DIAGNOSIS — Z86718 Personal history of other venous thrombosis and embolism: Secondary | ICD-10-CM | POA: Diagnosis not present

## 2019-03-07 DIAGNOSIS — M5137 Other intervertebral disc degeneration, lumbosacral region: Secondary | ICD-10-CM | POA: Diagnosis not present

## 2019-03-07 DIAGNOSIS — M9905 Segmental and somatic dysfunction of pelvic region: Secondary | ICD-10-CM | POA: Diagnosis not present

## 2019-03-07 DIAGNOSIS — M9904 Segmental and somatic dysfunction of sacral region: Secondary | ICD-10-CM | POA: Diagnosis not present

## 2019-03-07 DIAGNOSIS — M9903 Segmental and somatic dysfunction of lumbar region: Secondary | ICD-10-CM | POA: Diagnosis not present

## 2019-03-15 DIAGNOSIS — M9903 Segmental and somatic dysfunction of lumbar region: Secondary | ICD-10-CM | POA: Diagnosis not present

## 2019-03-15 DIAGNOSIS — M9905 Segmental and somatic dysfunction of pelvic region: Secondary | ICD-10-CM | POA: Diagnosis not present

## 2019-03-15 DIAGNOSIS — M5137 Other intervertebral disc degeneration, lumbosacral region: Secondary | ICD-10-CM | POA: Diagnosis not present

## 2019-03-15 DIAGNOSIS — M9904 Segmental and somatic dysfunction of sacral region: Secondary | ICD-10-CM | POA: Diagnosis not present

## 2019-03-16 DIAGNOSIS — M9904 Segmental and somatic dysfunction of sacral region: Secondary | ICD-10-CM | POA: Diagnosis not present

## 2019-03-16 DIAGNOSIS — M9903 Segmental and somatic dysfunction of lumbar region: Secondary | ICD-10-CM | POA: Diagnosis not present

## 2019-03-16 DIAGNOSIS — M9905 Segmental and somatic dysfunction of pelvic region: Secondary | ICD-10-CM | POA: Diagnosis not present

## 2019-03-16 DIAGNOSIS — M5137 Other intervertebral disc degeneration, lumbosacral region: Secondary | ICD-10-CM | POA: Diagnosis not present

## 2019-03-17 DIAGNOSIS — M542 Cervicalgia: Secondary | ICD-10-CM | POA: Diagnosis not present

## 2019-03-17 DIAGNOSIS — M545 Low back pain: Secondary | ICD-10-CM | POA: Diagnosis not present

## 2019-03-17 DIAGNOSIS — G519 Disorder of facial nerve, unspecified: Secondary | ICD-10-CM | POA: Diagnosis not present

## 2019-03-17 DIAGNOSIS — G603 Idiopathic progressive neuropathy: Secondary | ICD-10-CM | POA: Diagnosis not present

## 2019-03-17 DIAGNOSIS — M5481 Occipital neuralgia: Secondary | ICD-10-CM | POA: Diagnosis not present

## 2019-03-17 DIAGNOSIS — G5603 Carpal tunnel syndrome, bilateral upper limbs: Secondary | ICD-10-CM | POA: Diagnosis not present

## 2019-04-05 ENCOUNTER — Other Ambulatory Visit: Payer: Self-pay | Admitting: Family Medicine

## 2019-04-11 ENCOUNTER — Ambulatory Visit: Payer: Self-pay | Admitting: Surgery

## 2019-05-04 DIAGNOSIS — M9905 Segmental and somatic dysfunction of pelvic region: Secondary | ICD-10-CM | POA: Diagnosis not present

## 2019-05-04 DIAGNOSIS — M9904 Segmental and somatic dysfunction of sacral region: Secondary | ICD-10-CM | POA: Diagnosis not present

## 2019-05-04 DIAGNOSIS — M5137 Other intervertebral disc degeneration, lumbosacral region: Secondary | ICD-10-CM | POA: Diagnosis not present

## 2019-05-04 DIAGNOSIS — M9903 Segmental and somatic dysfunction of lumbar region: Secondary | ICD-10-CM | POA: Diagnosis not present

## 2019-05-05 DIAGNOSIS — M5137 Other intervertebral disc degeneration, lumbosacral region: Secondary | ICD-10-CM | POA: Diagnosis not present

## 2019-05-05 DIAGNOSIS — M9905 Segmental and somatic dysfunction of pelvic region: Secondary | ICD-10-CM | POA: Diagnosis not present

## 2019-05-05 DIAGNOSIS — M9903 Segmental and somatic dysfunction of lumbar region: Secondary | ICD-10-CM | POA: Diagnosis not present

## 2019-05-05 DIAGNOSIS — M9904 Segmental and somatic dysfunction of sacral region: Secondary | ICD-10-CM | POA: Diagnosis not present

## 2019-05-12 DIAGNOSIS — M5137 Other intervertebral disc degeneration, lumbosacral region: Secondary | ICD-10-CM | POA: Diagnosis not present

## 2019-05-12 DIAGNOSIS — M9903 Segmental and somatic dysfunction of lumbar region: Secondary | ICD-10-CM | POA: Diagnosis not present

## 2019-05-12 DIAGNOSIS — M9904 Segmental and somatic dysfunction of sacral region: Secondary | ICD-10-CM | POA: Diagnosis not present

## 2019-05-12 DIAGNOSIS — M9905 Segmental and somatic dysfunction of pelvic region: Secondary | ICD-10-CM | POA: Diagnosis not present

## 2019-05-18 ENCOUNTER — Ambulatory Visit (INDEPENDENT_AMBULATORY_CARE_PROVIDER_SITE_OTHER): Payer: Medicare Other | Admitting: Family Medicine

## 2019-05-18 ENCOUNTER — Encounter: Payer: Self-pay | Admitting: Family Medicine

## 2019-05-18 ENCOUNTER — Other Ambulatory Visit: Payer: Self-pay

## 2019-05-18 VITALS — BP 110/68 | HR 76 | Temp 98.2°F | Ht 72.0 in | Wt 170.5 lb

## 2019-05-18 DIAGNOSIS — M79661 Pain in right lower leg: Secondary | ICD-10-CM | POA: Diagnosis not present

## 2019-05-18 DIAGNOSIS — K648 Other hemorrhoids: Secondary | ICD-10-CM

## 2019-05-18 MED ORDER — METHYLPREDNISOLONE ACETATE 80 MG/ML IJ SUSP
80.0000 mg | Freq: Once | INTRAMUSCULAR | Status: AC
  Administered 2019-05-18: 80 mg via INTRAMUSCULAR

## 2019-05-18 MED ORDER — KETOROLAC TROMETHAMINE 60 MG/2ML IM SOLN
60.0000 mg | Freq: Once | INTRAMUSCULAR | Status: AC
  Administered 2019-05-18: 60 mg via INTRAMUSCULAR

## 2019-05-18 MED ORDER — NITROGLYCERIN 2 % TD OINT: 1.0000 [in_us] | TOPICAL_OINTMENT | Freq: Four times a day (QID) | TRANSDERMAL | 1 refills | Status: DC

## 2019-05-18 NOTE — Progress Notes (Signed)
   David Armstrong is a 69 y.o. male who presents today for an office visit.  Assessment/Plan:  New/Acute Problems: Right Calf Pain No red flags. Likely gastrocnemius strain vs achilles tendonitis.  Will give 80 mg of Depo-Medrol today and 60 mg Toradol.  Also place referral to sports medicine.  Hemorrhoids Send in nitroglycerin ointment.  Will follow up with surgery as previously planned.  Preventative Healthcare Extensively discussed COVID19 and vaccine. He is currently on the waitlist for the vaccine.     Subjective:  HPI:  He is still having right calf pain. Was seen about 5 months ago for this. Was given toradol and depomedrol which helped, but has had continued pain. Had an ultrasound then which was negative for DVT.  Worse with certain motions.   He is still having hemorrhoids issues as well. Would like a refill on nitroglycerin.       Objective:  Physical Exam: BP 110/68   Pulse 76   Temp 98.2 F (36.8 C)   Ht 6' (1.829 m)   Wt 170 lb 8 oz (77.3 kg)   SpO2 99%   BMI 23.12 kg/m   Gen: No acute distress, resting comfortably MSK: RLE without deformities. Pain along lateral gastrocnemius on palpation. Homans negative. Neurovascularly intact distally.   Time Spent: 45 minutes of total time was spent on the date of the encounter performing the following actions: chart review prior to seeing the patient, obtaining history, performing a medically necessary exam, counseling on the treatment plan, placing orders, and documenting in our EHR.        [name]. [name], MD 05/18/2019 10:47 AM

## 2019-05-18 NOTE — Patient Instructions (Signed)
It was very nice to see you today!  We will give you injections for your leg pain today.  I will send in your nitroglycerin  Take care, Dr [name]  Please try these tips to maintain a healthy lifestyle:   Eat at least 3 REAL meals and 1-2 snacks per day.  Aim for no more than 5 hours between eating.  If you eat breakfast, please do so within one hour of getting up.    Each meal should contain half fruits/vegetables, one quarter protein, and one quarter carbs (no bigger than a computer mouse)   Cut down on sweet beverages. This includes juice, soda, and sweet tea.     Drink at least 1 glass of water with each meal and aim for at least 8 glasses per day   Exercise at least 150 minutes every week.

## 2019-05-19 DIAGNOSIS — M9903 Segmental and somatic dysfunction of lumbar region: Secondary | ICD-10-CM | POA: Diagnosis not present

## 2019-05-19 DIAGNOSIS — M9904 Segmental and somatic dysfunction of sacral region: Secondary | ICD-10-CM | POA: Diagnosis not present

## 2019-05-19 DIAGNOSIS — M5137 Other intervertebral disc degeneration, lumbosacral region: Secondary | ICD-10-CM | POA: Diagnosis not present

## 2019-05-19 DIAGNOSIS — M9905 Segmental and somatic dysfunction of pelvic region: Secondary | ICD-10-CM | POA: Diagnosis not present

## 2019-05-20 ENCOUNTER — Ambulatory Visit (INDEPENDENT_AMBULATORY_CARE_PROVIDER_SITE_OTHER): Payer: Medicare Other | Admitting: Family Medicine

## 2019-05-20 ENCOUNTER — Encounter: Payer: Self-pay | Admitting: Family Medicine

## 2019-05-20 ENCOUNTER — Other Ambulatory Visit: Payer: Self-pay

## 2019-05-20 ENCOUNTER — Ambulatory Visit: Payer: Self-pay

## 2019-05-20 VITALS — BP 102/70 | HR 70 | Ht 72.0 in | Wt 170.6 lb

## 2019-05-20 DIAGNOSIS — M79661 Pain in right lower leg: Secondary | ICD-10-CM | POA: Diagnosis not present

## 2019-05-20 DIAGNOSIS — M7989 Other specified soft tissue disorders: Secondary | ICD-10-CM | POA: Diagnosis not present

## 2019-05-20 NOTE — Progress Notes (Signed)
    Subjective:    CC: R calf pain  I, David Armstrong, LAT, ATC, am serving as scribe for Dr. Lynne Leader.  HPI: Pt is a 69 y/o male presenting w/ c/o R calf pain x several months.  He was seen by his PCP in September and worked up for a possible R LE DVT.  R LE doppler US was negative for DVT.  He has a history of a prior DVT in his R LE and had been taking Xarelto but stopped taking Xarelto per advice of Dr. Gwenlyn Saran and was placed on a maintenance regemin of Aspirin.  He continues to have calf pain and was most recently seen by his PCP on 05/18/19 and was given an IM Depo 80/Toradol 60 injection and referred to Sports Medicine.  He has not been using a compression stocking.  Pain intensity: 6/10 aching Radiating pain: Yes in the R posterior lower leg Swelling: Yes in his R calf Numbness/tingling: No Aggravating factors: Nothing specific noted Treatments tried: IM injections (2 previous)   Pertinent review of Systems: No fevers or chills chest pain palpitation shortness of breath.  Relevant historical information: History of prior right leg DVT and PE.  History of abnormal venous reflux study 2018.   Objective:    Vitals:   05/20/19 1127  BP: 102/70  Pulse: 70  SpO2: 98%   General: Well Developed, well nourished, and in no acute distress.   MSK: Right leg: Soft tissue swelling lateral calf.  Mildly tender palpation. Normal knee foot and ankle motion. No pain to resisted plantar flexion dorsiflexion eversion inversion. Strength is intact. Stable ligamentous exam.  Pulses cap refill and sensation intact distally.  Lab and Radiology Results  Limited musculoskeletal ultrasound right lower leg. Normal-appearing Achilles tendon from insertion onto calcaneus to musculotendinous junction at gastrocnemius.  Normal-appearing gastrocnemius and soleus muscle. Significant soft tissue hypoechoic marbling at posterior and lateral lower leg consistent in appearance with edema.   Impression:  Right leg nonpitting edema   Impression and Recommendations:    Assessment and Plan: 69 y.o. male with right lower leg swelling.  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  Final physical exam and musculoskeletal ultrasound today the swelling is effectively edema indicating likely nonmusculoskeletal cause of symptoms.  Plan to repeat DVT ultrasound.  Assuming this shows no DVT recommend continued compression stockings and salt restriction.  This will be very important for preventing further leg swelling.  If not better would repeat venous reflux study and consider discussion with vascular surgery again.Marland Kitchen  PDMP not reviewed this encounter. Orders Placed This Encounter  Procedures  . [place] LIMITED JOINT SPACE STRUCTURES LOW RIGHT(NO LINKED CHARGES)    Order Specific Question:   Reason for Exam (SYMPTOM  OR DIAGNOSIS REQUIRED)    Answer:   R calf pain    Order Specific Question:   Preferred imaging location?    Answer:   [facility]   No orders of the defined types were placed in this encounter.   Discussed warning signs or symptoms. Please see discharge instructions. Patient expresses understanding.   The above documentation has been reviewed and is accurate and complete Lynne Leader

## 2019-05-20 NOTE — Patient Instructions (Addendum)
Thank you for coming in today. Use the compression stocking.  You should hear about scheduling the blood clot test soon.  If not better let me know and I will do the fancy venus reflux test and talk with the vascular surgeon.

## 2019-05-23 ENCOUNTER — Other Ambulatory Visit: Payer: Self-pay

## 2019-05-23 ENCOUNTER — Ambulatory Visit (HOSPITAL_COMMUNITY)
Admission: RE | Admit: 2019-05-23 | Discharge: 2019-05-23 | Disposition: A | Discharge status: Home / Self Care | Payer: Medicare Other | Source: Ambulatory Visit | Attending: Family Medicine | Admitting: Family Medicine

## 2019-05-23 DIAGNOSIS — M79661 Pain in right lower leg: Secondary | ICD-10-CM | POA: Diagnosis not present

## 2019-05-23 DIAGNOSIS — M7989 Other specified soft tissue disorders: Secondary | ICD-10-CM | POA: Insufficient documentation

## 2019-05-24 ENCOUNTER — Telehealth: Payer: Self-pay | Admitting: Family Medicine

## 2019-05-24 DIAGNOSIS — I82561 Chronic embolism and thrombosis of right calf muscular vein: Secondary | ICD-10-CM | POA: Insufficient documentation

## 2019-05-24 MED ORDER — RIVAROXABAN 20 MG PO TABS: 20.0000 mg | ORAL_TABLET | Freq: Every day | ORAL | 0 refills | Status: DC

## 2019-05-24 NOTE — Telephone Encounter (Signed)
-----   Message from Wendy Poet, LAT sent at 05/24/2019 11:52 AM EST ----- Called pt and relayed his venous doppler study results.  Pt states that he would like a prescription for Xarelto and not Eliquis and he would like that sent to the [facility] at [address].

## 2019-05-24 NOTE — Telephone Encounter (Signed)
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  Recommend follow-up with PCP for this issue.

## 2019-05-24 NOTE — Progress Notes (Signed)
It looks like you have a DVT in the right calf vein which is probably chronic.  Based on this and continued swelling is probably a good idea to get started back on Eliquis.  Would you like me to prescribe that?  Ideally this should be done by your primary care doctor.

## 2019-05-26 ENCOUNTER — Telehealth: Payer: Self-pay

## 2019-05-26 NOTE — Telephone Encounter (Signed)
Fax transmission successful.

## 2019-05-26 NOTE — Telephone Encounter (Signed)
Faxed all documents requested.

## 2019-05-26 NOTE — Telephone Encounter (Signed)
Patient called in and stated that the [facility] needs to get the [place] progress report/results, OV notes, and a copy of the prescription Xarelto that was prescribed that way the [place] will cover the cost. The fax number is [phone].  Patient stated if there is any trouble give him a call.

## 2019-06-01 DIAGNOSIS — M9905 Segmental and somatic dysfunction of pelvic region: Secondary | ICD-10-CM | POA: Diagnosis not present

## 2019-06-01 DIAGNOSIS — M9903 Segmental and somatic dysfunction of lumbar region: Secondary | ICD-10-CM | POA: Diagnosis not present

## 2019-06-01 DIAGNOSIS — M5137 Other intervertebral disc degeneration, lumbosacral region: Secondary | ICD-10-CM | POA: Diagnosis not present

## 2019-06-01 DIAGNOSIS — M9904 Segmental and somatic dysfunction of sacral region: Secondary | ICD-10-CM | POA: Diagnosis not present

## 2019-06-06 DIAGNOSIS — M5137 Other intervertebral disc degeneration, lumbosacral region: Secondary | ICD-10-CM | POA: Diagnosis not present

## 2019-06-06 DIAGNOSIS — M9905 Segmental and somatic dysfunction of pelvic region: Secondary | ICD-10-CM | POA: Diagnosis not present

## 2019-06-06 DIAGNOSIS — M9903 Segmental and somatic dysfunction of lumbar region: Secondary | ICD-10-CM | POA: Diagnosis not present

## 2019-06-06 DIAGNOSIS — M9904 Segmental and somatic dysfunction of sacral region: Secondary | ICD-10-CM | POA: Diagnosis not present

## 2019-06-08 DIAGNOSIS — M5137 Other intervertebral disc degeneration, lumbosacral region: Secondary | ICD-10-CM | POA: Diagnosis not present

## 2019-06-08 DIAGNOSIS — M9904 Segmental and somatic dysfunction of sacral region: Secondary | ICD-10-CM | POA: Diagnosis not present

## 2019-06-08 DIAGNOSIS — M9905 Segmental and somatic dysfunction of pelvic region: Secondary | ICD-10-CM | POA: Diagnosis not present

## 2019-06-08 DIAGNOSIS — M9903 Segmental and somatic dysfunction of lumbar region: Secondary | ICD-10-CM | POA: Diagnosis not present

## 2019-06-09 DIAGNOSIS — M545 Low back pain: Secondary | ICD-10-CM | POA: Diagnosis not present

## 2019-06-09 DIAGNOSIS — R200 Anesthesia of skin: Secondary | ICD-10-CM | POA: Diagnosis not present

## 2019-06-09 DIAGNOSIS — G603 Idiopathic progressive neuropathy: Secondary | ICD-10-CM | POA: Diagnosis not present

## 2019-06-09 DIAGNOSIS — M542 Cervicalgia: Secondary | ICD-10-CM | POA: Diagnosis not present

## 2019-06-09 DIAGNOSIS — R519 Headache, unspecified: Secondary | ICD-10-CM | POA: Diagnosis not present

## 2019-06-09 DIAGNOSIS — M5481 Occipital neuralgia: Secondary | ICD-10-CM | POA: Diagnosis not present

## 2019-06-14 ENCOUNTER — Other Ambulatory Visit: Payer: Self-pay

## 2019-06-14 MED ORDER — DESONIDE 0.05 % EX LOTN: TOPICAL_LOTION | CUTANEOUS | 2 refills | Status: DC

## 2019-06-27 ENCOUNTER — Telehealth: Payer: Self-pay | Admitting: Family Medicine

## 2019-06-27 NOTE — Telephone Encounter (Signed)
Please advise 

## 2019-06-27 NOTE — Telephone Encounter (Signed)
Patient calls in saying that he has been seen previously for blood clot behind the knee, states he has noticed since taking the blood thinner that he has been feeling really fatigued, having a hard time standing for a short periods of time. Also says that the day after receiving the blood thinner he had gone to the gym to workout and later that evening he had taken the medication and also took Lorazepam and noticed some bad fluttering in his heart for a few seconds -no other symptoms with it. Patient would like to know if he can discontinue taking the blood thinner, or if he needs to schedule an appointment to discuss this further.

## 2019-06-27 NOTE — Telephone Encounter (Signed)
Recommend he continue with blood thinner and schedule OV ASAP - he probably needs blood work.  [name]. [name], MD 06/27/2019 3:55 PM

## 2019-06-28 ENCOUNTER — Other Ambulatory Visit: Payer: Self-pay

## 2019-06-28 NOTE — Telephone Encounter (Signed)
Patient notified and scheduled appt

## 2019-06-29 ENCOUNTER — Ambulatory Visit (INDEPENDENT_AMBULATORY_CARE_PROVIDER_SITE_OTHER): Payer: Medicare Other | Admitting: Family Medicine

## 2019-06-29 VITALS — BP 120/80 | HR 75 | Temp 97.6°F | Wt 169.2 lb

## 2019-06-29 DIAGNOSIS — R5383 Other fatigue: Secondary | ICD-10-CM

## 2019-06-29 DIAGNOSIS — I82561 Chronic embolism and thrombosis of right calf muscular vein: Secondary | ICD-10-CM

## 2019-06-29 LAB — COMPREHENSIVE METABOLIC PANEL
ALT: 23 U/L (ref 0–53)
AST: 18 U/L (ref 0–37)
Albumin: 3.7 g/dL (ref 3.5–5.2)
Alkaline Phosphatase: 89 U/L (ref 39–117)
BUN: 20 mg/dL (ref 6–23)
CO2: 29 mEq/L (ref 19–32)
Calcium: 8.9 mg/dL (ref 8.4–10.5)
Chloride: 103 mEq/L (ref 96–112)
Creatinine, Ser: 0.73 mg/dL (ref 0.40–1.50)
GFR: 106.76 mL/min (ref >60.00)
Glucose, Bld: 83 mg/dL (ref 70–99)
Potassium: 4.1 mEq/L (ref 3.5–5.1)
Sodium: 141 mEq/L (ref 135–145)
Total Bilirubin: 0.4 mg/dL (ref 0.2–1.2)
Total Protein: 6.3 g/dL (ref 6.0–8.3)

## 2019-06-29 LAB — CBC
HCT: 46.6 % (ref 39.0–52.0)
Hemoglobin: 15.6 g/dL (ref 13.0–17.0)
MCHC: 33.4 g/dL (ref 30.0–36.0)
MCV: 92.1 fl (ref 78.0–100.0)
Platelets: 167 10*3/uL (ref 150.0–400.0)
RBC: 5.06 Mil/uL (ref 4.22–5.81)
RDW: 14.3 % (ref 11.5–15.5)
WBC: 7 10*3/uL (ref 4.0–10.5)

## 2019-06-29 LAB — URINALYSIS, ROUTINE W REFLEX MICROSCOPIC
Bilirubin Urine: NEGATIVE
Leukocytes,Ua: NEGATIVE
Nitrite: NEGATIVE
Specific Gravity, Urine: >=1.03 — AB (ref 1.000–1.030)
Total Protein, Urine: NEGATIVE
Urine Glucose: NEGATIVE
Urobilinogen, UA: 0.2 (ref 0.0–1.0)
pH: 5.5 (ref 5.0–8.0)

## 2019-06-29 NOTE — Progress Notes (Signed)
   David Armstrong is a 69 y.o. male who presents today for an office visit.  Assessment/Plan:  New/Acute Problems: Fatigue Check CBC, CMET, TSH, and UA.   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  Based on appearance of his DVT on ultrasound this likely represents a chronic DVT.  We will continue Xarelto 20 mg daily for at least the next 6 months.  Patient does not want to be on lifelong anticoagulation.  He is still having a little bit of pain in the area and may have some component of phlebitis.  Hopefully this will improve as his clot resolves.  May need referral to vascular surgery if continues to have pain in the area.  Discussed reasons return to care.  Will check CBC, C met, TSH, and UA today.     Subjective:  HPI:  Patient here for follow-up visit.  He was last seen about 6 weeks ago.  Since our last visit he was referred to sports medicine for persistent right calf pain.  Was eventually found to have a DVT and was started on Xarelto.  Over the last several days to weeks he has noticed increased fatigue and difficulty with standing.  He also has some palpitations.  Symptoms are overall stable.  Not noticed any hematuria.  No blood in stool.  He still has a little pain in his right lower extremity.  Seems to be at baseline.       Objective:  Physical Exam: BP 120/80   Pulse 75   Temp 97.6 F (36.4 C)   Wt 169 lb 3.2 oz (76.7 kg)   HC 6" (15.2 cm)   SpO2 96%   BMI 22.95 kg/m   Gen: No acute distress, resting comfortably Neuro: Grossly normal, moves all extremities Psych: Normal affect and thought content  Time Spent: 46 minutes of total time was spent on the date of the encounter performing the following actions: chart review prior to seeing the patient, obtaining history, performing a medically necessary exam, counseling on the treatment plan, placing  orders, and documenting in our EHR.        David Armstrong. [name], MD 06/29/2019 12:18 PM

## 2019-06-29 NOTE — Patient Instructions (Signed)
It was very nice to see you today!  We will check blood work today.  We will also check a urine sample.  We will need to repeat your ultrasound in about 6 months.  Take care, Dr [name]  Please try these tips to maintain a healthy lifestyle:   Eat at least 3 REAL meals and 1-2 snacks per day.  Aim for no more than 5 hours between eating.  If you eat breakfast, please do so within one hour of getting up.    Each meal should contain half fruits/vegetables, one quarter protein, and one quarter carbs (no bigger than a computer mouse)   Cut down on sweet beverages. This includes juice, soda, and sweet tea.     Drink at least 1 glass of water with each meal and aim for at least 8 glasses per day   Exercise at least 150 minutes every week.

## 2019-06-29 NOTE — Assessment & Plan Note (Signed)
Discussed recent diagnosis with patient.  Based on appearance of his DVT on ultrasound this likely represents a chronic DVT.  We will continue Xarelto 20 mg daily for at least the next 6 months.  Patient does not want to be on lifelong anticoagulation.  He is still having a little bit of pain in the area and may have some component of phlebitis.  Hopefully this will improve as his clot resolves.  May need referral to vascular surgery if continues to have pain in the area.  Discussed reasons return to care.  Will check CBC, C met, TSH, and UA today.

## 2019-06-30 DIAGNOSIS — M9904 Segmental and somatic dysfunction of sacral region: Secondary | ICD-10-CM | POA: Diagnosis not present

## 2019-06-30 DIAGNOSIS — M5137 Other intervertebral disc degeneration, lumbosacral region: Secondary | ICD-10-CM | POA: Diagnosis not present

## 2019-06-30 DIAGNOSIS — M9905 Segmental and somatic dysfunction of pelvic region: Secondary | ICD-10-CM | POA: Diagnosis not present

## 2019-06-30 DIAGNOSIS — M9903 Segmental and somatic dysfunction of lumbar region: Secondary | ICD-10-CM | POA: Diagnosis not present

## 2019-06-30 NOTE — Progress Notes (Signed)
Please inform patient of the following:  Blood work is all stable but urine sample shows that he is a bit dehydrated. Would like for him to make sure that he is getting at least 8 glass of water per day.  Would like for him to let us know if his symptoms are not improving.  David Armstrong. [name], MD 06/30/2019 1:01 PM

## 2019-07-04 DIAGNOSIS — M9904 Segmental and somatic dysfunction of sacral region: Secondary | ICD-10-CM | POA: Diagnosis not present

## 2019-07-04 DIAGNOSIS — M5137 Other intervertebral disc degeneration, lumbosacral region: Secondary | ICD-10-CM | POA: Diagnosis not present

## 2019-07-04 DIAGNOSIS — M9905 Segmental and somatic dysfunction of pelvic region: Secondary | ICD-10-CM | POA: Diagnosis not present

## 2019-07-04 DIAGNOSIS — M9903 Segmental and somatic dysfunction of lumbar region: Secondary | ICD-10-CM | POA: Diagnosis not present

## 2019-07-06 DIAGNOSIS — M9905 Segmental and somatic dysfunction of pelvic region: Secondary | ICD-10-CM | POA: Diagnosis not present

## 2019-07-06 DIAGNOSIS — M9904 Segmental and somatic dysfunction of sacral region: Secondary | ICD-10-CM | POA: Diagnosis not present

## 2019-07-06 DIAGNOSIS — M9903 Segmental and somatic dysfunction of lumbar region: Secondary | ICD-10-CM | POA: Diagnosis not present

## 2019-07-06 DIAGNOSIS — M5137 Other intervertebral disc degeneration, lumbosacral region: Secondary | ICD-10-CM | POA: Diagnosis not present

## 2019-07-21 DIAGNOSIS — R200 Anesthesia of skin: Secondary | ICD-10-CM | POA: Diagnosis not present

## 2019-07-21 DIAGNOSIS — G603 Idiopathic progressive neuropathy: Secondary | ICD-10-CM | POA: Diagnosis not present

## 2019-07-21 DIAGNOSIS — M5417 Radiculopathy, lumbosacral region: Secondary | ICD-10-CM | POA: Diagnosis not present

## 2019-07-21 DIAGNOSIS — R519 Headache, unspecified: Secondary | ICD-10-CM | POA: Diagnosis not present

## 2019-07-21 DIAGNOSIS — G5623 Lesion of ulnar nerve, bilateral upper limbs: Secondary | ICD-10-CM | POA: Diagnosis not present

## 2019-07-21 DIAGNOSIS — G5603 Carpal tunnel syndrome, bilateral upper limbs: Secondary | ICD-10-CM | POA: Diagnosis not present

## 2019-07-21 DIAGNOSIS — M5412 Radiculopathy, cervical region: Secondary | ICD-10-CM | POA: Diagnosis not present

## 2019-07-27 DIAGNOSIS — L570 Actinic keratosis: Secondary | ICD-10-CM | POA: Diagnosis not present

## 2019-07-27 DIAGNOSIS — L819 Disorder of pigmentation, unspecified: Secondary | ICD-10-CM | POA: Diagnosis not present

## 2019-08-01 DIAGNOSIS — L408 Other psoriasis: Secondary | ICD-10-CM | POA: Diagnosis not present

## 2019-08-01 DIAGNOSIS — L821 Other seborrheic keratosis: Secondary | ICD-10-CM | POA: Diagnosis not present

## 2019-08-01 DIAGNOSIS — L578 Other skin changes due to chronic exposure to nonionizing radiation: Secondary | ICD-10-CM | POA: Diagnosis not present

## 2019-08-01 DIAGNOSIS — L570 Actinic keratosis: Secondary | ICD-10-CM | POA: Diagnosis not present

## 2019-08-04 DIAGNOSIS — R202 Paresthesia of skin: Secondary | ICD-10-CM | POA: Diagnosis not present

## 2019-08-04 DIAGNOSIS — M545 Low back pain: Secondary | ICD-10-CM | POA: Diagnosis not present

## 2019-08-04 DIAGNOSIS — M542 Cervicalgia: Secondary | ICD-10-CM | POA: Diagnosis not present

## 2019-08-04 DIAGNOSIS — R200 Anesthesia of skin: Secondary | ICD-10-CM | POA: Diagnosis not present

## 2019-08-04 DIAGNOSIS — R519 Headache, unspecified: Secondary | ICD-10-CM | POA: Diagnosis not present

## 2019-08-17 ENCOUNTER — Telehealth: Payer: Self-pay | Admitting: Family Medicine

## 2019-08-17 NOTE — Telephone Encounter (Signed)
Patient called in this afternoon wanting to see Dr.Parker

## 2019-08-17 NOTE — Telephone Encounter (Signed)
Patient transferred  to [place]  to be scheduled.

## 2019-08-17 NOTE — Telephone Encounter (Signed)
Patient is wanting to see Dr.Parker due recently having some really bad fatigue after lunch, would prefer to do an in office visit on Monday, okay to take virtual slot for 3:40?

## 2019-08-17 NOTE — Telephone Encounter (Signed)
Patient has been scheduled for Monday at 11am to see Dr. [name].

## 2019-08-19 ENCOUNTER — Other Ambulatory Visit: Payer: Self-pay

## 2019-08-22 ENCOUNTER — Encounter: Payer: Self-pay | Admitting: Family Medicine

## 2019-08-22 ENCOUNTER — Ambulatory Visit (INDEPENDENT_AMBULATORY_CARE_PROVIDER_SITE_OTHER): Payer: Medicare Other | Admitting: Family Medicine

## 2019-08-22 ENCOUNTER — Other Ambulatory Visit: Payer: Self-pay

## 2019-08-22 VITALS — BP 119/77 | HR 67 | Temp 98.7°F | Ht 72.0 in | Wt 171.5 lb

## 2019-08-22 DIAGNOSIS — G4730 Sleep apnea, unspecified: Secondary | ICD-10-CM

## 2019-08-22 DIAGNOSIS — F320 Major depressive disorder, single episode, mild: Secondary | ICD-10-CM

## 2019-08-22 DIAGNOSIS — F411 Generalized anxiety disorder: Secondary | ICD-10-CM

## 2019-08-22 NOTE — Assessment & Plan Note (Signed)
Stable.  Continue management per psychiatry.  Hopefully will have some improvement in energy levels if we treat undiagnosed OSA.

## 2019-08-22 NOTE — Progress Notes (Signed)
   David Armstrong is a 69 y.o. male who presents today for an office visit.  Assessment/Plan:  New/Acute Problems: Sleep disordered breathing/fatigue Concern for sleep apnea.  Recent labs within normal limits.  Will place referral for sleep study.  Chronic Problems Addressed Today: Depression, major, single episode, mild ([facility]) Stable.  Continue management per psychiatry.  Hopefully will have some improvement in energy levels if we treat undiagnosed OSA.  Anxiety state Stable.  Continue management per psychiatry.  Have some improvement if OSA diagnosis is confirmed and treated appropriately.     Subjective:  HPI:  Patient here with excessive somnolence.  Has been getting worse over the last couple of months.  Usually becomes very fatigued and tired in the afternoon.  Will occasionally doze off.  Sleeps about 6 hours per night.  He has been told he snores in the past.  Has slightly worsened anxiety and depression since our last visit as well.  He has got both Covid vaccines and has done well with them.       Objective:  Physical Exam: BP 119/77   Pulse 67   Temp 98.7 F (37.1 C)   Ht 6' (1.829 m)   Wt 171 lb 8 oz (77.8 kg)   SpO2 96%   BMI 23.26 kg/m   Gen: No acute distress, resting comfortably Neuro: Grossly normal, moves all extremities Psych: Normal affect and thought content  Time Spent: 45 minutes of total time was spent on the date of the encounter performing the following actions: chart review prior to seeing the patient, obtaining history, performing a medically necessary exam, counseling on the treatment plan, placing orders, and documenting in our EHR.        [name]. [name], MD 08/22/2019 12:57 PM

## 2019-08-22 NOTE — Assessment & Plan Note (Signed)
Stable.  Continue management per psychiatry.  Have some improvement if OSA diagnosis is confirmed and treated appropriately.

## 2019-08-22 NOTE — Patient Instructions (Signed)
It was very nice to see you today!  We will place a referral for you to have a sleep study.  No other changes today.   Take care, Dr [name]  Please try these tips to maintain a healthy lifestyle:   Eat at least 3 REAL meals and 1-2 snacks per day.  Aim for no more than 5 hours between eating.  If you eat breakfast, please do so within one hour of getting up.    Each meal should contain half fruits/vegetables, one quarter protein, and one quarter carbs (no bigger than a computer mouse)   Cut down on sweet beverages. This includes juice, soda, and sweet tea.     Drink at least 1 glass of water with each meal and aim for at least 8 glasses per day   Exercise at least 150 minutes every week.

## 2019-08-31 ENCOUNTER — Other Ambulatory Visit: Payer: Self-pay | Admitting: *Deleted

## 2019-08-31 ENCOUNTER — Telehealth: Payer: Self-pay | Admitting: Family Medicine

## 2019-08-31 MED ORDER — OMEPRAZOLE 20 MG PO CPDR: 20.0000 mg | DELAYED_RELEASE_CAPSULE | Freq: Every day | ORAL | 3 refills | Status: DC

## 2019-08-31 NOTE — Telephone Encounter (Signed)
Rx send to pharmacy  

## 2019-08-31 NOTE — Telephone Encounter (Signed)
Patient called in and stated that the last time that he picked up this prescription omeprazole (PRILOSEC) 20 MG capsule that his insurance would not cover it anymore. Patient wanted to see if there anything else Dr. [name] could prescribe.

## 2019-09-01 ENCOUNTER — Institutional Professional Consult (permissible substitution): Payer: Self-pay | Admitting: Neurology

## 2019-09-01 ENCOUNTER — Telehealth: Payer: Self-pay | Admitting: Family Medicine

## 2019-09-01 NOTE — Telephone Encounter (Signed)
Patient calling, ins will not pay for the omeprazole 20 mg. Could you please advise.

## 2019-09-02 NOTE — Telephone Encounter (Signed)
Please advise 

## 2019-09-02 NOTE — Telephone Encounter (Signed)
He can either get over the counter or he can check with his insurance company to see what acid blocker medications they will pay for.  [name]. [name], MD 09/02/2019 9:51 AM

## 2019-09-02 NOTE — Telephone Encounter (Signed)
Left message to return call to our office at their convenience.  

## 2019-09-02 NOTE — Telephone Encounter (Signed)
.  smg 

## 2019-09-05 DIAGNOSIS — M9903 Segmental and somatic dysfunction of lumbar region: Secondary | ICD-10-CM | POA: Diagnosis not present

## 2019-09-05 DIAGNOSIS — M5137 Other intervertebral disc degeneration, lumbosacral region: Secondary | ICD-10-CM | POA: Diagnosis not present

## 2019-09-05 DIAGNOSIS — M9904 Segmental and somatic dysfunction of sacral region: Secondary | ICD-10-CM | POA: Diagnosis not present

## 2019-09-05 DIAGNOSIS — M9905 Segmental and somatic dysfunction of pelvic region: Secondary | ICD-10-CM | POA: Diagnosis not present

## 2019-09-06 NOTE — Telephone Encounter (Signed)
Patient notified. He states he got an otc medication for his GERD.

## 2019-09-07 DIAGNOSIS — M5137 Other intervertebral disc degeneration, lumbosacral region: Secondary | ICD-10-CM | POA: Diagnosis not present

## 2019-09-07 DIAGNOSIS — M9903 Segmental and somatic dysfunction of lumbar region: Secondary | ICD-10-CM | POA: Diagnosis not present

## 2019-09-07 DIAGNOSIS — M9905 Segmental and somatic dysfunction of pelvic region: Secondary | ICD-10-CM | POA: Diagnosis not present

## 2019-09-07 DIAGNOSIS — M9904 Segmental and somatic dysfunction of sacral region: Secondary | ICD-10-CM | POA: Diagnosis not present

## 2019-09-08 ENCOUNTER — Other Ambulatory Visit: Payer: Self-pay | Admitting: Surgery

## 2019-09-08 DIAGNOSIS — K649 Unspecified hemorrhoids: Secondary | ICD-10-CM | POA: Diagnosis not present

## 2019-09-08 DIAGNOSIS — K641 Second degree hemorrhoids: Secondary | ICD-10-CM | POA: Diagnosis not present

## 2019-09-09 DIAGNOSIS — R3912 Poor urinary stream: Secondary | ICD-10-CM | POA: Diagnosis not present

## 2019-09-10 ENCOUNTER — Encounter (HOSPITAL_COMMUNITY): Payer: Self-pay | Admitting: Emergency Medicine

## 2019-09-10 ENCOUNTER — Emergency Department (HOSPITAL_COMMUNITY)
Admission: EM | Admit: 2019-09-10 | Discharge: 2019-09-10 | Disposition: A | Discharge status: Home / Self Care | Payer: Medicare Other | Attending: Emergency Medicine | Admitting: Emergency Medicine

## 2019-09-10 ENCOUNTER — Other Ambulatory Visit: Payer: Self-pay

## 2019-09-10 DIAGNOSIS — R338 Other retention of urine: Secondary | ICD-10-CM | POA: Diagnosis not present

## 2019-09-10 DIAGNOSIS — Z7901 Long term (current) use of anticoagulants: Secondary | ICD-10-CM | POA: Diagnosis not present

## 2019-09-10 DIAGNOSIS — N400 Enlarged prostate without lower urinary tract symptoms: Secondary | ICD-10-CM | POA: Diagnosis not present

## 2019-09-10 DIAGNOSIS — Z466 Encounter for fitting and adjustment of urinary device: Secondary | ICD-10-CM | POA: Diagnosis not present

## 2019-09-10 DIAGNOSIS — Z79899 Other long term (current) drug therapy: Secondary | ICD-10-CM | POA: Insufficient documentation

## 2019-09-10 DIAGNOSIS — Z7982 Long term (current) use of aspirin: Secondary | ICD-10-CM | POA: Diagnosis not present

## 2019-09-10 DIAGNOSIS — K625 Hemorrhage of anus and rectum: Secondary | ICD-10-CM | POA: Insufficient documentation

## 2019-09-10 DIAGNOSIS — R339 Retention of urine, unspecified: Secondary | ICD-10-CM | POA: Diagnosis not present

## 2019-09-10 LAB — URINALYSIS, ROUTINE W REFLEX MICROSCOPIC
Bilirubin Urine: NEGATIVE
Glucose, UA: NEGATIVE mg/dL
Hgb urine dipstick: NEGATIVE
Ketones, ur: NEGATIVE mg/dL
Leukocytes,Ua: NEGATIVE
Nitrite: NEGATIVE
Protein, ur: NEGATIVE mg/dL
Specific Gravity, Urine: 1.004 — ABNORMAL LOW (ref 1.005–1.030)
pH: 6 (ref 5.0–8.0)

## 2019-09-10 NOTE — ED Notes (Signed)
Urine culture sent down to lab with urinalysis. 

## 2019-09-10 NOTE — ED Notes (Signed)
One attempt with foley unsuccessful. RN aware

## 2019-09-10 NOTE — ED Triage Notes (Signed)
Pt reports having rectal prolapse repair 09/07/19 and is having some bleeding also c/o difficult urination and has prostate issues.

## 2019-09-10 NOTE — ED Notes (Signed)
This nurse attempted a coude catheter with no success, EDP made aware.

## 2019-09-10 NOTE — ED Provider Notes (Signed)
[facility] DEPT Provider Note   [name]: [id number] Arrival date & time: 09/10/19  0211   History Chief Complaint  Patient presents with  . Rectal Bleeding    David Armstrong is a 69 y.o. male.  The history is provided by the patient.  Rectal Bleeding He has history of hyperlipidemia, pulmonary embolism and states that he had surgery for prolapsed hemorrhoids 2 days ago.  Since then, he has not had a bowel movement.  He was having difficulty urinating and went to his surgeon today where attempt was made to insert a Foley catheter but was unsuccessful.  He went to see his urologist who was going to catheterize him, but he was able to urinate at the urologist's office.  Decision was made not to catheterize him but he has not been able to urinate since then.  He is able to have a few drops that dribble out.  He has also noted some rectal bleeding, but this is only a small amount of blood.  Past Medical History:  Diagnosis Date  . Anxiety   . Arthritis    "lower back; hips;' right knee"   . Bipolar disorder ([place])   . Chronic constipation   . Depression   . Diverticulosis of colon   . Edema of both lower extremities    ANKLES  . Fatty liver   . GERD (gastroesophageal reflux disease)   . Hiatal hernia   . History of anal fissures   . History of deep vein thrombosis (DVT) of lower extremity 02/11/2016   right lower extremity distal and proximal extensive acute  . History of Helicobacter pylori infection 2012; 2009  . History of kidney stones   . History of pneumonia 01/2015   CAP  . 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 Hyperlipidemia   . IBS (irritable bowel syndrome)   . Left ureteral stone   . Nocturia   . Peripheral neuropathy    per pt due to back DDD  . Personal history of colonic polyps    HYPERPLASTIC POLYP  . Pulmonary nodule, right    incidental finding CT chest angio 02-11-2016 stable  . TMJ  (temporomandibular joint syndrome)   . Varicosities of leg     Patient Active Problem List   Diagnosis Date Noted  . 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 Dyslipidemia 02/05/2018  . Seborrheic dermatitis 09/14/2017  . Inguinal hernia 06/05/2017  . Allergic rhinitis 04/23/2017  . Lumbar degenerative disc disease 07/31/2016  . History of pulmonary embolism and DVT (Oct 2017) 02/11/2016  . Nephrolithiasis 04/19/2014  . Peripheral neuropathy ([place]) 12/15/2013  . IBS (irritable bowel syndrome) 03/09/2012  . GERD 10/26/2007  . Anxiety state 11/23/2006  . Depression, major, single episode, mild ([place]) 11/23/2006    Past Surgical History:  Procedure Laterality Date  . BELPHAROPTOSIS REPAIR Bilateral 01/2015  . CATARACT EXTRACTION W/ INTRAOCULAR LENS  IMPLANT, BILATERAL  04/2016  . COLONOSCOPY WITH ESOPHAGOGASTRODUODENOSCOPY (EGD)  last one 03-02-2013  . CYSTOSCOPY/RETROGRADE/URETEROSCOPY/STONE EXTRACTION WITH BASKET  2010  . CYSTOSCOPY/URETEROSCOPY/HOLMIUM LASER/STENT PLACEMENT Left 03/16/2017   Procedure: CYSTOSCOPY/RETROGRADE/URETEROSCOPY/HOLMIUM LASER/STENT PLACEMENT, STONE BASKET EXTRACTION AND STENT PLACEMENT;  Surgeon: [name], MD;  Location: [facility];  Service: Urology;  Laterality: Left;  . EXCISIONAL HEMORRHOIDECTOMY  2015 approx.  Marland Kitchen EXTRACORPOREAL SHOCK WAVE LITHOTRIPSY  1987; 1992  . FOOT NEUROMA SURGERY Bilateral left 09/2015; right 04/ 2017  morton' neuroma  . FOOT NEUROMA SURGERY Right 2004   morton's neuroma and removal heel spur  . HAMMER TOE SURGERY Left 2013   5th toe  . INCISION AND DRAINAGE FOOT Left 1992   "ball of foot; stepped on piece of hard wire & it went thru my shoe"  . KNEE ARTHROSCOPY Bilateral left x2 1997;  right 2004 & 2005  . NASAL FRACTURE SURGERY  1997  . PATELLA-FEMORAL ARTHROPLASTY Right 07-06-2003   dr Eddie Dibbles [facility]  . PLANTAR FASCIA RELEASE Bilateral 1987 & 1993   . RHINOPLASTY  2000  . SHOULDER ARTHROSCOPY Left 2012;  2013  . SHOULDER ARTHROSCOPY WITH DISTAL CLAVICLE RESECTION Right 07-31-2005   dr Percell Miller  [facility]   w/ Debridement labral and adhesions, acromioplasty, bursectomy, and CA ligament release  . TRANSTHORACIC ECHOCARDIOGRAM  02/12/2016   ef [id number], grade 1 diastolic dysfunction/  trivial TR       Family History  Problem Relation Age of Onset  . Heart disease Mother   . Stroke Mother   . Cirrhosis Father   . Heart disease Father   . Skin cancer Father   . Prostate cancer Maternal Grandfather   . Parkinson's disease Paternal Grandfather   . Heart disease Brother   . Colon cancer Neg Hx   . Stomach cancer Neg Hx     Social History   Tobacco Use  . Smoking status: Never Smoker  . Smokeless tobacco: Never Used  Substance Use Topics  . Alcohol use: No    Alcohol/week: 0.0 standard drinks  . Drug use: No    Home Medications Prior to Admission medications   Medication Sig Start Date End Date Taking? Authorizing Provider  aspirin EC 81 MG tablet Take 81 mg by mouth daily.    [provider]  atorvastatin (LIPITOR) 40 MG tablet Take 1 tablet (40 mg total) by mouth daily. Patient not taking: Reported on 06/29/2019 03/05/18   Vivi Barrack, MD  clindamycin (CLEOCIN) 300 MG capsule TAKE 2 CAPSULES BY MOUTH STAT THEN 1 CAPSULE THREE TIMES DAILY FOR DENTAL INFECTION 05/11/19   [provider]  clobetasol (TEMOVATE) 0.05 % external solution APPLY APPLICATION TOPICALLY TO THE SCALP BID. DO NOT USE ON THE FACE 11/15/18   [provider]  desonide (DESOWEN) 0.05 % lotion APPLY EXTERNALLY TO THE AFFECTED AREA EVERY DAY AS NEEDED 06/14/19   Vivi Barrack, MD  fluticasone [facility]) 50 MCG/ACT nasal spray Place 2 sprays into both nostrils daily as needed for allergies or rhinitis. 07/28/17   Vivi Barrack, MD  gentamicin ointment (GARAMYCIN) 0.1 % APPLY TO WOUND 2 TIMES DAILY 12/09/18   [provider]   hyoscyamine (LEVSIN SL) 0.125 MG SL tablet DISSOLVE 1 TABLET(0.125 MG) UNDER THE TONGUE EVERY 6 HOURS AS NEEDED 04/05/19   Vivi Barrack, MD  ketoconazole (NIZORAL) 2 % cream APPLY EXTERNALLY TO THE AFFECTED AREA 1 TO 2 TIMES DAILY Patient not taking: Reported on 06/29/2019 01/20/19   Vivi Barrack, MD  LORazepam (ATIVAN) 1 MG tablet Take 0.5-1 tablets (0.5-1 mg total) by mouth every 8 (eight) hours as needed for anxiety. 05/26/17   Vivi Barrack, MD  omeprazole (PRILOSEC) 20 MG capsule Take 1 capsule (20 mg total) by mouth daily. 08/31/19   Vivi Barrack, MD  rivaroxaban (XARELTO) 20 MG TABS tablet Take 1 tablet (20 mg total) by mouth daily with supper. 05/24/19   Gregor Hams, MD    Allergies    Effexor  xr [venlafaxine hcl er], Acyclovir and related, Zocor [simvastatin], Dexilant [dexlansoprazole], Ivp dye [iodinated diagnostic agents], Soma [carisoprodol], and Sulfa antibiotics  Review of Systems   Review of Systems  Gastrointestinal: Positive for hematochezia.  All other systems reviewed and are negative.   Physical Exam Updated Vital Signs BP (!) 120/93 (BP Location: Left Arm)   Pulse 89   Temp 98.5 F (36.9 C) (Oral)   Resp 16   SpO2 97%   Physical Exam Vitals and nursing note reviewed.   69 year old male, resting comfortably and in no acute distress. Vital signs are significant for mildly elevated diastolic blood pressure. Oxygen saturation is 97%, which is normal. Head is normocephalic and atraumatic. PERRLA, EOMI. Oropharynx is clear. Neck is nontender and supple without adenopathy or JVD. Back is nontender and there is no CVA tenderness. Lungs are clear without rales, wheezes, or rhonchi. Chest is nontender. Heart has regular rate and rhythm without murmur. Abdomen is soft, flat, nontender without masses or hepatosplenomegaly and peristalsis is normoactive.  Bladder is moderately distended. Rectal: Suture line is intact.  There is moderate tenderness on exam.  No  impaction detected. Extremities have no cyanosis or edema, full range of motion is present. Skin is warm and dry without rash. Neurologic: Mental status is normal, cranial nerves are intact, there are no motor or sensory deficits.  ED Results / Procedures / Treatments   Labs (all labs ordered are listed, but only abnormal results are displayed) Labs Reviewed - No data to display  Procedures Procedures (including critical care time)  Medications Ordered in ED Medications - No data to display  ED Course  I have reviewed the triage vital signs and the nursing notes.  Pertinent labs & imaging results that were available during my care of the patient were reviewed by me and considered in my medical decision making (see chart for details).  MDM Rules/Calculators/A&P Urinary retention.  Status post hemorrhoid resection.  Will insert Foley catheter.  Nursing is unable to insert Foley catheter.  I attempted to insert a coud catheter but without success.  Case was discussed with Dr. Alyson Ingles, on-call for urology, who agrees to come and insert a catheter.  Final Clinical Impression(s) / ED Diagnoses Final diagnoses:  Acute urinary retention    Rx / DC Orders ED Discharge Orders    None       Delora Fuel, MD [phone]

## 2019-09-10 NOTE — ED Provider Notes (Signed)
Clinical Course as of Sep 09 744  Sat Sep 10, 2019  [id number] Pt signed out to me by Dr Roxanne Mins.  69 yo unable to urinate, unable to place coude or foley by staff or overnight EDP, plan is awaiting urology consult to place foley, anticipate discharge afterwards.  NO active issues reported by EDP for prolapse   [MT]    Clinical Course User Index [MT] Langston Masker, Carola Rhine, MD   Dr Alyson Ingles was able to successfully place foley catheter, suspects difficulty may be due to constipation and recommended miralax to the patient.  Will discharge now.  Pt can f/u with urology and surgeon.   Wyvonnia Dusky, MD 09/10/19 [phone]

## 2019-09-10 NOTE — ED Notes (Signed)
Provider at bedside

## 2019-09-10 NOTE — Consult Note (Signed)
Urology Consult  Referring physician: Dr. Roxanne Mins Reason for referral: difficult foley placement  Chief Complaint: urinary retention  History of Present Illness: [name] is a 69yo with a history of BPH who presented to the ER this morning with an inability to urinate since last night. He underwent hemorrhoid surgery 5/27 and had a worsening stream. He presented to my office yesterday and had a minimal PVR. He then developed inability to urinate last night. Multiple attempts were made to place a foley catheter in the ER which were unsuccessful.   Past Medical History:  Diagnosis Date  . Anxiety   . Arthritis    "lower back; hips;' right knee"   . Bipolar disorder ([place])   . Chronic constipation   . Depression   . Diverticulosis of colon   . Edema of both lower extremities    ANKLES  . Fatty liver   . GERD (gastroesophageal reflux disease)   . Hiatal hernia   . History of anal fissures   . History of deep vein thrombosis (DVT) of lower extremity 02/11/2016   right lower extremity distal and proximal extensive acute  . History of Helicobacter pylori infection 2012; 2009  . History of kidney stones   . History of pneumonia 01/2015   CAP  . 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 Hyperlipidemia   . IBS (irritable bowel syndrome)   . Left ureteral stone   . Nocturia   . Peripheral neuropathy    per pt due to back DDD  . Personal history of colonic polyps    HYPERPLASTIC POLYP  . Pulmonary nodule, right    incidental finding CT chest angio 02-11-2016 stable  . TMJ (temporomandibular joint syndrome)   . Varicosities of leg    Past Surgical History:  Procedure Laterality Date  . BELPHAROPTOSIS REPAIR Bilateral 01/2015  . CATARACT EXTRACTION W/ INTRAOCULAR LENS  IMPLANT, BILATERAL  04/2016  . COLONOSCOPY WITH ESOPHAGOGASTRODUODENOSCOPY (EGD)  last one 03-02-2013  . CYSTOSCOPY/RETROGRADE/URETEROSCOPY/STONE EXTRACTION WITH BASKET  2010  .  CYSTOSCOPY/URETEROSCOPY/HOLMIUM LASER/STENT PLACEMENT Left 03/16/2017   Procedure: CYSTOSCOPY/RETROGRADE/URETEROSCOPY/HOLMIUM LASER/STENT PLACEMENT, STONE BASKET EXTRACTION AND STENT PLACEMENT;  Surgeon: [name], MD;  Location: [place];  Service: Urology;  Laterality: Left;  . EXCISIONAL HEMORRHOIDECTOMY  2015 approx.  Marland Kitchen EXTRACORPOREAL SHOCK WAVE LITHOTRIPSY  1987; 1992  . FOOT NEUROMA SURGERY Bilateral left 09/2015; right 04/ 2017   morton' neuroma  . FOOT NEUROMA SURGERY Right 2004   morton's neuroma and removal heel spur  . HAMMER TOE SURGERY Left 2013   5th toe  . INCISION AND DRAINAGE FOOT Left 1992   "ball of foot; stepped on piece of hard wire & it went thru my shoe"  . KNEE ARTHROSCOPY Bilateral left x2 1997;  right 2004 & 2005  . NASAL FRACTURE SURGERY  1997  . PATELLA-FEMORAL ARTHROPLASTY Right 07-06-2003   dr Eddie Dibbles [facility]  . PLANTAR FASCIA RELEASE Bilateral 1987 & 1993  . RHINOPLASTY  2000  . SHOULDER ARTHROSCOPY Left 2012;  2013  . SHOULDER ARTHROSCOPY WITH DISTAL CLAVICLE RESECTION Right 07-31-2005   dr Percell Miller  [facility]   w/ Debridement labral and adhesions, acromioplasty, bursectomy, and CA ligament release  . TRANSTHORACIC ECHOCARDIOGRAM  02/12/2016   ef Q000111Q, grade 1 diastolic dysfunction/  trivial TR    Medications: I have reviewed the patient's current medications. Allergies:  Allergies  Allergen Reactions  . Effexor Xr [Venlafaxine Hcl Er] Other (See Comments)    Insomnia  .  Acyclovir And Related   . Zocor [Simvastatin] Other (See Comments)    Muscle ache/ pain  . Dexilant [Dexlansoprazole] Other (See Comments)    Muscle aches  . Ivp Dye [Iodinated Diagnostic Agents] Rash  . Soma [Carisoprodol] Other (See Comments)    Sores on arm  . Sulfa Antibiotics Rash    Family History  Problem Relation Age of Onset  . Heart disease Mother   . Stroke Mother   . Cirrhosis Father   . Heart disease Father   . Skin cancer Father   . Prostate  cancer Maternal Grandfather   . Parkinson's disease Paternal Grandfather   . Heart disease Brother   . Colon cancer Neg Hx   . Stomach cancer Neg Hx    Social History:  reports that he has never smoked. He has never used smokeless tobacco. He reports that he does not drink alcohol or use drugs.  Review of Systems  Genitourinary: Positive for difficulty urinating.  All other systems reviewed and are negative.   Physical Exam:  Vital signs in last 24 hours: Temp:  [98.5 F (36.9 C)] 98.5 F (36.9 C) (05/29 0225) Pulse Rate:  [71-89] 71 (05/29 0800) Resp:  [16-18] 16 (05/29 0800) BP: (101-123)/(72-98) 116/98 (05/29 0800) SpO2:  [96 %-100 %] 100 % (05/29 0800) Physical Exam  Constitutional: He is oriented to person, place, and time. He appears well-developed and well-nourished.  HENT:  Head: Normocephalic and atraumatic.  Eyes: Pupils are equal, round, and reactive to light. EOM are normal.  Neck: No thyromegaly present.  Cardiovascular: Normal rate and regular rhythm.  Respiratory: Effort normal. No respiratory distress.  GI: Soft. He exhibits no distension. Hernia confirmed negative in the right inguinal area and confirmed negative in the left inguinal area.  Genitourinary:    Testes and penis normal.  Circumcised.  Musculoskeletal:        General: No edema. Normal range of motion.     Cervical back: Normal range of motion.  Lymphadenopathy:       Right: No inguinal adenopathy present.       Left: No inguinal adenopathy present.  Neurological: He is alert and oriented to person, place, and time.  Skin: Skin is warm and dry.  Psychiatric: He has a normal mood and affect. His behavior is normal. Judgment and thought content normal.    Laboratory Data:  Results for orders placed or performed during the hospital encounter of 09/10/19 (from the past 72 hour(s))  Urinalysis, Routine w reflex microscopic     Status: Abnormal   Collection Time: 09/10/19  4:30 AM  Result Value  Ref Range   Color, Urine STRAW (A) YELLOW   APPearance CLEAR CLEAR   Specific Gravity, Urine 1.004 (L) 1.005 - 1.030   pH 6.0 5.0 - 8.0   Glucose, UA NEGATIVE NEGATIVE mg/dL   Hgb urine dipstick NEGATIVE NEGATIVE   Bilirubin Urine NEGATIVE NEGATIVE   Ketones, ur NEGATIVE NEGATIVE mg/dL   Protein, ur NEGATIVE NEGATIVE mg/dL   Nitrite NEGATIVE NEGATIVE   Leukocytes,Ua NEGATIVE NEGATIVE    Comment: Performed at [facility], [place] [address], [place], [place] [address]   No results found for this or any previous visit (from the past 240 hour(s)). Creatinine: No results for input(s): CREATININE in the last 168 hours. Baseline Creatinine: unknown  Impression/Assessment:  68yo with BPH with urinary retention  Plan:  1. 20 french coude placed and 1200cc of clear urine drained. Patient to followup in 1 week  for a voiding trial. Patient started flomax yesterday  Nicolette Bang 09/10/2019, 9:03 AM

## 2019-09-10 NOTE — ED Notes (Signed)
Enters my care. Report from night RN. Urology at bedside.

## 2019-09-11 ENCOUNTER — Encounter (HOSPITAL_COMMUNITY): Payer: Self-pay | Admitting: Emergency Medicine

## 2019-09-11 ENCOUNTER — Other Ambulatory Visit: Payer: Self-pay

## 2019-09-11 ENCOUNTER — Emergency Department (HOSPITAL_COMMUNITY)
Admission: EM | Admit: 2019-09-11 | Discharge: 2019-09-11 | Disposition: A | Discharge status: Home / Self Care | Payer: Medicare Other | Attending: Emergency Medicine | Admitting: Emergency Medicine

## 2019-09-11 DIAGNOSIS — Z7901 Long term (current) use of anticoagulants: Secondary | ICD-10-CM | POA: Insufficient documentation

## 2019-09-11 DIAGNOSIS — Z96651 Presence of right artificial knee joint: Secondary | ICD-10-CM | POA: Insufficient documentation

## 2019-09-11 DIAGNOSIS — N4889 Other specified disorders of penis: Secondary | ICD-10-CM | POA: Insufficient documentation

## 2019-09-11 DIAGNOSIS — T839XXA Unspecified complication of genitourinary prosthetic device, implant and graft, initial encounter: Secondary | ICD-10-CM | POA: Insufficient documentation

## 2019-09-11 DIAGNOSIS — Z7982 Long term (current) use of aspirin: Secondary | ICD-10-CM | POA: Insufficient documentation

## 2019-09-11 DIAGNOSIS — Y732 Prosthetic and other implants, materials and accessory gastroenterology and urology devices associated with adverse incidents: Secondary | ICD-10-CM | POA: Diagnosis not present

## 2019-09-11 DIAGNOSIS — N5082 Scrotal pain: Secondary | ICD-10-CM | POA: Insufficient documentation

## 2019-09-11 DIAGNOSIS — Z79899 Other long term (current) drug therapy: Secondary | ICD-10-CM | POA: Insufficient documentation

## 2019-09-11 DIAGNOSIS — R339 Retention of urine, unspecified: Secondary | ICD-10-CM | POA: Diagnosis not present

## 2019-09-11 DIAGNOSIS — T83098A Other mechanical complication of other indwelling urethral catheter, initial encounter: Secondary | ICD-10-CM | POA: Diagnosis not present

## 2019-09-11 NOTE — ED Notes (Signed)
Patient has indwelling catheter. Patient states his catheter bag will not fill up. Upon assessment, catheter bag almost full. Patient stated he went two hours without urine in the bag. Patient says the catheter was inserted yesterday due to urinary retention. Patient was told to call [facility] this upcoming Tuesday to set up a time to have catheter removed this Wednesday.

## 2019-09-11 NOTE — ED Notes (Signed)
Bladder scan showed 43mL.

## 2019-09-11 NOTE — ED Triage Notes (Signed)
Patient here from home reporting urinary retention. States that his catheter is not draining well today. Reports being seen on yesterday for same. Also reports contacting urology and advised to come here. Also states that he had recent hemorrhoids. surgery and site is draining.

## 2019-09-11 NOTE — ED Provider Notes (Signed)
[name] Provider Note   [name]: [id number] Arrival date & time: 09/11/19  1541     History Chief Complaint  Patient presents with  . Rectal Pain  . Urinary Retention    David Armstrong is a 69 y.o. male.  HPI    69 year old male comes in a chief complaint of urinary retention.  Patient is also having some discomfort around the Foley site. Patient reports that the Foley was placed just recently in the ER.  It was draining okay last night.  This morning when he woke up there was only small amount of urine in the bag and over time there has been no increased urine collection.  Patient is not having severe abdominal pain.  He is having some discomfort around the scrotal region and base of his penile shaft.  Seen some swelling.   Past Medical History:  Diagnosis Date  . Anxiety   . Arthritis    "lower back; hips;' right knee"   . Bipolar disorder ([place])   . Chronic constipation   . Depression   . Diverticulosis of colon   . Edema of both lower extremities    ANKLES  . Fatty liver   . GERD (gastroesophageal reflux disease)   . Hiatal hernia   . History of anal fissures   . History of deep vein thrombosis (DVT) of lower extremity 02/11/2016   right lower extremity distal and proximal extensive acute  . History of Helicobacter pylori infection 2012; 2009  . History of kidney stones   . History of pneumonia 01/2015   CAP  . 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 Hyperlipidemia   . IBS (irritable bowel syndrome)   . Left ureteral stone   . Nocturia   . Peripheral neuropathy    per pt due to back DDD  . Personal history of colonic polyps    HYPERPLASTIC POLYP  . Pulmonary nodule, right    incidental finding CT chest angio 02-11-2016 stable  . TMJ (temporomandibular joint syndrome)   . Varicosities of leg     Patient Active Problem List   Diagnosis Date Noted  . 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 Dyslipidemia 02/05/2018  . Seborrheic dermatitis 09/14/2017  . Inguinal hernia 06/05/2017  . Allergic rhinitis 04/23/2017  . Lumbar degenerative disc disease 07/31/2016  . History of pulmonary embolism and DVT (Oct 2017) 02/11/2016  . Nephrolithiasis 04/19/2014  . Peripheral neuropathy ([place]) 12/15/2013  . IBS (irritable bowel syndrome) 03/09/2012  . GERD 10/26/2007  . Anxiety state 11/23/2006  . Depression, major, single episode, mild ([place]) 11/23/2006    Past Surgical History:  Procedure Laterality Date  . BELPHAROPTOSIS REPAIR Bilateral 01/2015  . CATARACT EXTRACTION W/ INTRAOCULAR LENS  IMPLANT, BILATERAL  04/2016  . COLONOSCOPY WITH ESOPHAGOGASTRODUODENOSCOPY (EGD)  last one 03-02-2013  . CYSTOSCOPY/RETROGRADE/URETEROSCOPY/STONE EXTRACTION WITH BASKET  2010  . CYSTOSCOPY/URETEROSCOPY/HOLMIUM LASER/STENT PLACEMENT Left 03/16/2017   Procedure: CYSTOSCOPY/RETROGRADE/URETEROSCOPY/HOLMIUM LASER/STENT PLACEMENT, STONE BASKET EXTRACTION AND STENT PLACEMENT;  Surgeon: [name], MD;  Location: [facility];  Service: Urology;  Laterality: Left;  . EXCISIONAL HEMORRHOIDECTOMY  2015 approx.  Marland Kitchen EXTRACORPOREAL SHOCK WAVE LITHOTRIPSY  1987; 1992  . FOOT NEUROMA SURGERY Bilateral left 09/2015; right 04/ 2017   morton' neuroma  . FOOT NEUROMA SURGERY Right 2004   morton's neuroma and removal heel spur  . HAMMER TOE SURGERY Left 2013  5th toe  . INCISION AND DRAINAGE FOOT Left 1992   "ball of foot; stepped on piece of hard wire & it went thru my shoe"  . KNEE ARTHROSCOPY Bilateral left x2 1997;  right 2004 & 2005  . NASAL FRACTURE SURGERY  1997  . PATELLA-FEMORAL ARTHROPLASTY Right 07-06-2003   dr Eddie Dibbles [facility]  . PLANTAR FASCIA RELEASE Bilateral 1987 & 1993  . RHINOPLASTY  2000  . SHOULDER ARTHROSCOPY Left 2012;  2013  . SHOULDER ARTHROSCOPY WITH DISTAL CLAVICLE RESECTION Right 07-31-2005   dr Percell Miller  [facility]   w/  Debridement labral and adhesions, acromioplasty, bursectomy, and CA ligament release  . TRANSTHORACIC ECHOCARDIOGRAM  02/12/2016   ef Q000111Q, grade 1 diastolic dysfunction/  trivial TR       Family History  Problem Relation Age of Onset  . Heart disease Mother   . Stroke Mother   . Cirrhosis Father   . Heart disease Father   . Skin cancer Father   . Prostate cancer Maternal Grandfather   . Parkinson's disease Paternal Grandfather   . Heart disease Brother   . Colon cancer Neg Hx   . Stomach cancer Neg Hx     Social History   Tobacco Use  . Smoking status: Never Smoker  . Smokeless tobacco: Never Used  Substance Use Topics  . Alcohol use: No    Alcohol/week: 0.0 standard drinks  . Drug use: No    Home Medications Prior to Admission medications   Medication Sig Start Date End Date Taking? Authorizing Provider  aspirin EC 81 MG tablet Take 81 mg by mouth daily.   Yes [provider]  desonide (DESOWEN) 0.05 % lotion APPLY EXTERNALLY TO THE AFFECTED AREA EVERY DAY AS NEEDED Patient taking differently: Apply 1 application topically daily as needed (skin irritation).  06/14/19  Yes Vivi Barrack, MD  doxycycline (VIBRAMYCIN) 100 MG capsule Take 100 mg by mouth daily. Start date : 08/19/19 08/19/19  Yes [provider]  hyoscyamine (LEVSIN SL) 0.125 MG SL tablet DISSOLVE 1 TABLET(0.125 MG) UNDER THE TONGUE EVERY 6 HOURS AS NEEDED Patient taking differently: Take 0.125 mg by mouth every 6 (six) hours as needed for cramping.  04/05/19  Yes Vivi Barrack, MD  LORazepam (ATIVAN) 1 MG tablet Take 0.5-1 tablets (0.5-1 mg total) by mouth every 8 (eight) hours as needed for anxiety. 05/26/17  Yes Vivi Barrack, MD  omeprazole (PRILOSEC) 20 MG capsule Take 1 capsule (20 mg total) by mouth daily. 08/31/19  Yes Vivi Barrack, MD  tamsulosin (FLOMAX) 0.4 MG CAPS capsule Take 0.4 mg by mouth daily. 09/09/19  Yes [provider]  atorvastatin (LIPITOR) 40 MG tablet  Take 1 tablet (40 mg total) by mouth daily. Patient not taking: Reported on 06/29/2019 03/05/18   Vivi Barrack, MD  clindamycin (CLEOCIN) 300 MG capsule TAKE 2 CAPSULES BY MOUTH STAT THEN 1 CAPSULE THREE TIMES DAILY FOR DENTAL INFECTION 05/11/19   [provider]  fluticasone (FLONASE) 50 MCG/ACT nasal spray Place 2 sprays into both nostrils daily as needed for allergies or rhinitis. Patient not taking: Reported on 09/11/2019 07/28/17   Vivi Barrack, MD  ketoconazole (NIZORAL) 2 % cream APPLY EXTERNALLY TO THE AFFECTED AREA 1 TO 2 TIMES DAILY Patient not taking: Reported on 06/29/2019 01/20/19   Vivi Barrack, MD  rivaroxaban (XARELTO) 20 MG TABS tablet Take 1 tablet (20 mg total) by mouth daily with supper. Patient not taking: Reported on 09/11/2019 05/24/19  Gregor Hams, MD    Allergies    Effexor xr [venlafaxine hcl er], Acyclovir and related, Zocor [simvastatin], Dexilant [dexlansoprazole], Ivp dye [iodinated diagnostic agents], Soma [carisoprodol], and Sulfa antibiotics  Review of Systems   Review of Systems  Constitutional: Negative for activity change.  Gastrointestinal: Negative for abdominal pain.  Genitourinary: Positive for difficulty urinating.  Skin: Positive for rash.  Hematological: Does not bruise/bleed easily.    Physical Exam Updated Vital Signs BP 132/77 (BP Location: Right Arm)   Pulse 93   Temp 98.4 F (36.9 C) (Oral)   Resp 18   SpO2 99%   Physical Exam Vitals and nursing note reviewed.  Constitutional:      Appearance: He is well-developed.  HENT:     Head: Atraumatic.  Cardiovascular:     Rate and Rhythm: Normal rate.  Pulmonary:     Effort: Pulmonary effort is normal.  Abdominal:     Tenderness: There is no abdominal tenderness.  Genitourinary:    Comments: Leg bag is filled with urine.  There is no edema, erythema over the scrotum or penis Musculoskeletal:     Cervical back: Neck supple.  Skin:    General: Skin is warm.   Neurological:     Mental Status: He is alert and oriented to person, place, and time.     ED Results / Procedures / Treatments   Labs (all labs ordered are listed, but only abnormal results are displayed) Labs Reviewed - No data to display  EKG None  Radiology No results found.  Procedures Procedures (including critical care time)  Medications Ordered in ED Medications - No data to display  ED Course  I have reviewed the triage vital signs and the nursing notes.  Pertinent labs & imaging results that were available during my care of the patient were reviewed by me and considered in my medical decision making (see chart for details).    MDM Rules/Calculators/A&P                      Patient comes in a chief complaint of difficulty voiding.  Patient had Foley catheter placed yesterday for urinary retention.  Urologist had to come in and placed a urinary catheter.  It seems like the bag was not draining urine earlier today.  However, when I saw him the catheter bag is filled with urine.  Patient has no abdominal tenderness.  Bladder scan reveals less than 50 mL of urine.  No signs of infection.  Patient looks comfortable.  Hemodynamically stable.  He is stable for discharge at this time.  We will advise him to follow-up with urology as planned.  Perhaps there was a blockage or clogging of the Foley catheter that self resolved.  Final Clinical Impression(s) / ED Diagnoses Final diagnoses:  Urinary catheter complication, initial encounter [facility])    Rx / DC Orders ED Discharge Orders    None       Varney Biles, MD 09/11/19 1744

## 2019-09-11 NOTE — Discharge Instructions (Signed)
The catheter is draining well.  Please continue following up with urology as planned.  Return to the ER if you start having severe abdominal pain, fevers and if the catheter is not draining any urine in your bag at all.

## 2019-09-14 DIAGNOSIS — R338 Other retention of urine: Secondary | ICD-10-CM | POA: Diagnosis not present

## 2019-09-14 DIAGNOSIS — R3912 Poor urinary stream: Secondary | ICD-10-CM | POA: Diagnosis not present

## 2019-09-16 ENCOUNTER — Telehealth: Payer: Self-pay

## 2019-09-16 ENCOUNTER — Telehealth (INDEPENDENT_AMBULATORY_CARE_PROVIDER_SITE_OTHER): Payer: Medicare Other | Admitting: Family Medicine

## 2019-09-16 ENCOUNTER — Encounter: Payer: Self-pay | Admitting: Family Medicine

## 2019-09-16 VITALS — Ht 72.0 in | Wt 170.0 lb

## 2019-09-16 DIAGNOSIS — Z9889 Other specified postprocedural states: Secondary | ICD-10-CM

## 2019-09-16 DIAGNOSIS — Z8719 Personal history of other diseases of the digestive system: Secondary | ICD-10-CM | POA: Diagnosis not present

## 2019-09-16 DIAGNOSIS — R339 Retention of urine, unspecified: Secondary | ICD-10-CM | POA: Diagnosis not present

## 2019-09-16 MED ORDER — LIDOCAINE 5 % EX OINT
1.0000 | TOPICAL_OINTMENT | CUTANEOUS | 0 refills | Status: DC | PRN
Start: 2019-09-16 — End: 2020-02-10

## 2019-09-16 NOTE — Telephone Encounter (Signed)
Please advise 

## 2019-09-16 NOTE — Telephone Encounter (Signed)
Patient calling and states that the lidocaine (XYLOCAINE) 5 % ointment  that was sent to [facility], they told him that it's hard to find and that it would take 3 days for them to get the medication. Would like to know if there is another cream or medication that would be similar that might be easier to find? Please advise.   [id number] [address] [place], [place] [address] AT [place]

## 2019-09-16 NOTE — Telephone Encounter (Signed)
He can use dermoplast - this is available OTC and is a spray on lidocaine.  HE can check with pharmacist to see if they have anything else similar.

## 2019-09-16 NOTE — Progress Notes (Signed)
   David Armstrong is a 69 y.o. male who presents today for a telephone visit.  Assessment/Plan:  New/Acute Problems: Urinary Retention Likely complication of anesthesia related to recent surgery. Will continue management per urology. He will continue flomax 0.4mg  daily.   S/p hemorrhedectomy  Will send in lidocaine gel. Continue hydration.  Continue stool softeners and fiber supplement.  Will avoid narcotics due to constipation risk.     Subjective:  HPI:  Patient had hemorrhoidectomy on on 09/08/2019.  Had acute urinary retention and went to the ED on 09/10/2019 and had foley placed by urology. Still has indwelling foley and will be follow up with urology next week.Catheter seems to be working. He was started on flomax 0.4mg  daily.   He still has some pain around the site where his surgery was. He does not have any follow up scheduled at this point.        Objective/Observations   NAD  Telephone Visit   I connected with David Armstrong on 09/16/19 at 11:40 AM EDT via telephone and verified that I am speaking with the correct person using two identifiers. I discussed the limitations of evaluation and management by telemedicine and the availability of in person appointments. The patient expressed understanding and agreed to proceed.   Patient location: Home Provider location: [place] participating in the virtual visit: Myself and Patient  A total of 30 minutes were spent on medical discussion.      [name]. [name], MD 09/16/2019 12:01 PM

## 2019-09-19 NOTE — Telephone Encounter (Signed)
Patient notify he voices understanding

## 2019-09-21 DIAGNOSIS — M9903 Segmental and somatic dysfunction of lumbar region: Secondary | ICD-10-CM | POA: Diagnosis not present

## 2019-09-21 DIAGNOSIS — M9904 Segmental and somatic dysfunction of sacral region: Secondary | ICD-10-CM | POA: Diagnosis not present

## 2019-09-21 DIAGNOSIS — M5137 Other intervertebral disc degeneration, lumbosacral region: Secondary | ICD-10-CM | POA: Diagnosis not present

## 2019-09-21 DIAGNOSIS — R3912 Poor urinary stream: Secondary | ICD-10-CM | POA: Diagnosis not present

## 2019-09-21 DIAGNOSIS — N401 Enlarged prostate with lower urinary tract symptoms: Secondary | ICD-10-CM | POA: Diagnosis not present

## 2019-09-21 DIAGNOSIS — M9905 Segmental and somatic dysfunction of pelvic region: Secondary | ICD-10-CM | POA: Diagnosis not present

## 2019-09-21 DIAGNOSIS — R338 Other retention of urine: Secondary | ICD-10-CM | POA: Diagnosis not present

## 2019-09-23 ENCOUNTER — Emergency Department (HOSPITAL_COMMUNITY): Payer: Medicare Other

## 2019-09-23 ENCOUNTER — Encounter (HOSPITAL_COMMUNITY): Payer: Self-pay | Admitting: *Deleted

## 2019-09-23 ENCOUNTER — Emergency Department (HOSPITAL_COMMUNITY)
Admission: EM | Admit: 2019-09-23 | Discharge: 2019-09-24 | Disposition: A | Discharge status: Home / Self Care | Payer: Medicare Other | Attending: Emergency Medicine | Admitting: Emergency Medicine

## 2019-09-23 ENCOUNTER — Other Ambulatory Visit: Payer: Self-pay

## 2019-09-23 DIAGNOSIS — R509 Fever, unspecified: Secondary | ICD-10-CM | POA: Diagnosis not present

## 2019-09-23 DIAGNOSIS — E785 Hyperlipidemia, unspecified: Secondary | ICD-10-CM | POA: Insufficient documentation

## 2019-09-23 DIAGNOSIS — R39198 Other difficulties with micturition: Secondary | ICD-10-CM | POA: Insufficient documentation

## 2019-09-23 DIAGNOSIS — R55 Syncope and collapse: Secondary | ICD-10-CM | POA: Insufficient documentation

## 2019-09-23 DIAGNOSIS — Z20822 Contact with and (suspected) exposure to covid-19: Secondary | ICD-10-CM | POA: Insufficient documentation

## 2019-09-23 DIAGNOSIS — Z7901 Long term (current) use of anticoagulants: Secondary | ICD-10-CM | POA: Insufficient documentation

## 2019-09-23 DIAGNOSIS — R1084 Generalized abdominal pain: Secondary | ICD-10-CM | POA: Diagnosis not present

## 2019-09-23 DIAGNOSIS — R339 Retention of urine, unspecified: Secondary | ICD-10-CM | POA: Diagnosis present

## 2019-09-23 LAB — URINALYSIS, ROUTINE W REFLEX MICROSCOPIC
Bilirubin Urine: NEGATIVE
Glucose, UA: NEGATIVE mg/dL
Hgb urine dipstick: NEGATIVE
Ketones, ur: NEGATIVE mg/dL
Leukocytes,Ua: NEGATIVE
Nitrite: NEGATIVE
Protein, ur: NEGATIVE mg/dL
Specific Gravity, Urine: 1.008 (ref 1.005–1.030)
pH: 8 (ref 5.0–8.0)

## 2019-09-23 LAB — CBC WITH DIFFERENTIAL/PLATELET
Abs Immature Granulocytes: 0.07 10*3/uL (ref 0.00–0.07)
Basophils Absolute: 0 10*3/uL (ref 0.0–0.1)
Basophils Relative: 0 %
Eosinophils Absolute: 0 10*3/uL (ref 0.0–0.5)
Eosinophils Relative: 0 %
HCT: 35.6 % — ABNORMAL LOW (ref 39.0–52.0)
Hemoglobin: 12.1 g/dL — ABNORMAL LOW (ref 13.0–17.0)
Immature Granulocytes: 1 %
Lymphocytes Relative: 7 %
Lymphs Abs: 0.8 10*3/uL (ref 0.7–4.0)
MCH: 31.3 pg (ref 26.0–34.0)
MCHC: 34 g/dL (ref 30.0–36.0)
MCV: 92 fL (ref 80.0–100.0)
Monocytes Absolute: 1.1 10*3/uL — ABNORMAL HIGH (ref 0.1–1.0)
Monocytes Relative: 9 %
Neutro Abs: 9.4 10*3/uL — ABNORMAL HIGH (ref 1.7–7.7)
Neutrophils Relative %: 83 %
Platelets: 177 10*3/uL (ref 150–400)
RBC: 3.87 MIL/uL — ABNORMAL LOW (ref 4.22–5.81)
RDW: 13.1 % (ref 11.5–15.5)
WBC: 11.4 10*3/uL — ABNORMAL HIGH (ref 4.0–10.5)
nRBC: 0 % (ref 0.0–0.2)

## 2019-09-23 LAB — COMPREHENSIVE METABOLIC PANEL
ALT: 16 U/L (ref 0–44)
AST: 18 U/L (ref 15–41)
Albumin: 3.3 g/dL — ABNORMAL LOW (ref 3.5–5.0)
Alkaline Phosphatase: 63 U/L (ref 38–126)
Anion gap: 6 (ref 5–15)
BUN: 10 mg/dL (ref 8–23)
CO2: 24 mmol/L (ref 22–32)
Calcium: 7.6 mg/dL — ABNORMAL LOW (ref 8.9–10.3)
Chloride: 102 mmol/L (ref 98–111)
Creatinine, Ser: 0.96 mg/dL (ref 0.61–1.24)
GFR calc Af Amer: >60 mL/min (ref >60)
GFR calc non Af Amer: >60 mL/min (ref >60)
Glucose, Bld: 111 mg/dL — ABNORMAL HIGH (ref 70–99)
Potassium: 4 mmol/L (ref 3.5–5.1)
Sodium: 132 mmol/L — ABNORMAL LOW (ref 135–145)
Total Bilirubin: 1.3 mg/dL — ABNORMAL HIGH (ref 0.3–1.2)
Total Protein: 5.8 g/dL — ABNORMAL LOW (ref 6.5–8.1)

## 2019-09-23 LAB — LIPASE, BLOOD: Lipase: 18 U/L (ref 11–51)

## 2019-09-23 LAB — LACTIC ACID, PLASMA: Lactic Acid, Venous: 1 mmol/L (ref 0.5–1.9)

## 2019-09-23 LAB — SARS CORONAVIRUS 2 BY RT PCR (HOSPITAL ORDER, PERFORMED IN ~~LOC~~ HOSPITAL LAB): SARS Coronavirus 2: NEGATIVE

## 2019-09-23 LAB — BRAIN NATRIURETIC PEPTIDE: B Natriuretic Peptide: 142 pg/mL — ABNORMAL HIGH (ref 0.0–100.0)

## 2019-09-23 MED ORDER — SODIUM CHLORIDE 0.9 % IV SOLN: INTRAVENOUS | Status: DC

## 2019-09-23 MED ORDER — SODIUM CHLORIDE 0.9 % IV BOLUS
500.0000 mL | Freq: Once | INTRAVENOUS | Status: AC
  Administered 2019-09-23: 500 mL via INTRAVENOUS

## 2019-09-23 MED ORDER — ACETAMINOPHEN 500 MG PO TABS
1000.0000 mg | ORAL_TABLET | Freq: Once | ORAL | Status: AC
  Administered 2019-09-23: 1000 mg via ORAL
  Filled 2019-09-23: qty 2

## 2019-09-23 MED ORDER — ONDANSETRON HCL 4 MG/2ML IJ SOLN
4.0000 mg | Freq: Once | INTRAMUSCULAR | Status: AC
  Administered 2019-09-23: 4 mg via INTRAVENOUS
  Filled 2019-09-23: qty 2

## 2019-09-23 NOTE — ED Notes (Signed)
Pt bottom cleaned up and brief changed.

## 2019-09-23 NOTE — ED Notes (Signed)
Bladder scan 100mls  

## 2019-09-23 NOTE — ED Triage Notes (Signed)
Patient presents to the ED with urinary retention.  Patient complains of back pain and weakness.  Patient states he had a near syncopal episode at his home and when he woke up had called EMS.

## 2019-09-23 NOTE — ED Provider Notes (Signed)
[facility] EMERGENCY DEPARTMENT Provider Note   [name]: [id number] Arrival date & time: 09/23/19  1418     History Chief Complaint  Patient presents with  . Urinary Retention    David Armstrong is a 69 y.o. male.  Patient since he woke up this morning.  Feels that he is not emptying his bladder properly having urinary retention problems.  Patient recently had Foley catheter removed.  Patient started feeling bad yesterday had an episode of vomiting yesterday and episode of vomiting today.  No cough.  Does have a fever.  Upon arrival here room air sats were 99%.  Blood pressure 102/64 heart rate was 89.  Patient had hemorrhoid surgery for prolapsed hemorrhoids on May 23.  Ran into some urinary retention.  Patient had a Foley catheter removed on Wednesday.  Was post to follow-up with urology today.  They did not feel well enough to go.  Patient did have Covid vaccine second one in March.  Patient does have a contrast allergy.  Patient denies any diarrhea.  Bladder scan was done before I saw him.  And he only had 100 cc of urine in the bladder.        Past Medical History:  Diagnosis Date  . Anxiety   . Arthritis    "lower back; hips;' right knee"   . Bipolar disorder ([place])   . Chronic constipation   . Depression   . Diverticulosis of colon   . Edema of both lower extremities    ANKLES  . Fatty liver   . GERD (gastroesophageal reflux disease)   . Hiatal hernia   . History of anal fissures   . History of deep vein thrombosis (DVT) of lower extremity 02/11/2016   right lower extremity distal and proximal extensive acute  . History of Helicobacter pylori infection 2012; 2009  . History of kidney stones   . History of pneumonia 01/2015   CAP  . 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 Hyperlipidemia   . IBS (irritable bowel syndrome)   . Left ureteral stone   . Nocturia   . Peripheral neuropathy    per pt due to back DDD  . Personal history of  colonic polyps    HYPERPLASTIC POLYP  . Pulmonary nodule, right    incidental finding CT chest angio 02-11-2016 stable  . TMJ (temporomandibular joint syndrome)   . Varicosities of leg     Patient Active Problem List   Diagnosis Date Noted  . 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 Dyslipidemia 02/05/2018  . Seborrheic dermatitis 09/14/2017  . Inguinal hernia 06/05/2017  . Allergic rhinitis 04/23/2017  . Lumbar degenerative disc disease 07/31/2016  . History of pulmonary embolism and DVT (Oct 2017) 02/11/2016  . Nephrolithiasis 04/19/2014  . Peripheral neuropathy ([place]) 12/15/2013  . IBS (irritable bowel syndrome) 03/09/2012  . GERD 10/26/2007  . Anxiety state 11/23/2006  . Depression, major, single episode, mild ([place]) 11/23/2006    Past Surgical History:  Procedure Laterality Date  . BELPHAROPTOSIS REPAIR Bilateral 01/2015  . CATARACT EXTRACTION W/ INTRAOCULAR LENS  IMPLANT, BILATERAL  04/2016  . COLONOSCOPY WITH ESOPHAGOGASTRODUODENOSCOPY (EGD)  last one 03-02-2013  . CYSTOSCOPY/RETROGRADE/URETEROSCOPY/STONE EXTRACTION WITH BASKET  2010  . CYSTOSCOPY/URETEROSCOPY/HOLMIUM LASER/STENT PLACEMENT Left 03/16/2017   Procedure: CYSTOSCOPY/RETROGRADE/URETEROSCOPY/HOLMIUM LASER/STENT PLACEMENT, STONE BASKET EXTRACTION AND STENT PLACEMENT;  Surgeon: [name], MD;  Location: [facility];  Service: Urology;  Laterality: Left;  . EXCISIONAL HEMORRHOIDECTOMY  2015 approx.  Marland Kitchen EXTRACORPOREAL SHOCK WAVE LITHOTRIPSY  1987; 1992  . FOOT NEUROMA SURGERY Bilateral left 09/2015; right 04/ 2017   morton' neuroma  . FOOT NEUROMA SURGERY Right 2004   morton's neuroma and removal heel spur  . HAMMER TOE SURGERY Left 2013   5th toe  . INCISION AND DRAINAGE FOOT Left 1992   "ball of foot; stepped on piece of hard wire & it went thru my shoe"  . KNEE ARTHROSCOPY Bilateral left x2 1997;  right 2004 & 2005  . NASAL FRACTURE  SURGERY  1997  . PATELLA-FEMORAL ARTHROPLASTY Right 07-06-2003   dr Eddie Dibbles [facility]  . PLANTAR FASCIA RELEASE Bilateral 1987 & 1993  . RHINOPLASTY  2000  . SHOULDER ARTHROSCOPY Left 2012;  2013  . SHOULDER ARTHROSCOPY WITH DISTAL CLAVICLE RESECTION Right 07-31-2005   dr Percell Miller  [facility]   w/ Debridement labral and adhesions, acromioplasty, bursectomy, and CA ligament release  . TRANSTHORACIC ECHOCARDIOGRAM  02/12/2016   ef 31-51%, grade 1 diastolic dysfunction/  trivial TR       Family History  Problem Relation Age of Onset  . Heart disease Mother   . Stroke Mother   . Cirrhosis Father   . Heart disease Father   . Skin cancer Father   . Prostate cancer Maternal Grandfather   . Parkinson's disease Paternal Grandfather   . Heart disease Brother   . Colon cancer Neg Hx   . Stomach cancer Neg Hx     Social History   Tobacco Use  . Smoking status: Never Smoker  . Smokeless tobacco: Never Used  Vaping Use  . Vaping Use: Never used  Substance Use Topics  . Alcohol use: No    Alcohol/week: 0.0 standard drinks  . Drug use: No    Home Medications Prior to Admission medications   Medication Sig Start Date End Date Taking? Authorizing Provider  aspirin EC 81 MG tablet Take 81 mg by mouth daily.   Yes [provider]  bisacodyl (DULCOLAX) 5 MG EC tablet Take 5 mg by mouth daily as needed for mild constipation or moderate constipation.   Yes [provider]  desonide (DESOWEN) 0.05 % lotion APPLY EXTERNALLY TO THE AFFECTED AREA EVERY DAY AS NEEDED Patient taking differently: Apply 1 application topically daily as needed (skin irritation).  06/14/19  Yes Vivi Barrack, MD  doxycycline (VIBRAMYCIN) 100 MG capsule Take 100 mg by mouth 2 (two) times daily. 15 day course starting on 09/19/2019 09/19/19  Yes [provider]  hyoscyamine (LEVSIN SL) 0.125 MG SL tablet DISSOLVE 1 TABLET(0.125 MG) UNDER THE TONGUE EVERY 6 HOURS AS NEEDED Patient taking differently: Take 0.125  mg by mouth every 6 (six) hours as needed for cramping.  04/05/19  Yes Vivi Barrack, MD  lidocaine (XYLOCAINE) 5 % ointment Apply 1 application topically as needed. Patient taking differently: Apply 1 application topically daily as needed for mild pain or moderate pain.  09/16/19  Yes Vivi Barrack, MD  LORazepam (ATIVAN) 1 MG tablet Take 0.5-1 tablets (0.5-1 mg total) by mouth every 8 (eight) hours as needed for anxiety. 05/26/17  Yes Vivi Barrack, MD  magnesium hydroxide (MILK OF MAGNESIA) 800 MG/5ML suspension Take 15 mLs by mouth daily as needed for constipation.   Yes [provider]  omeprazole (PRILOSEC) 20 MG capsule Take 1 capsule (20 mg total) by mouth daily. 08/31/19  Yes Vivi Barrack, MD  tamsulosin [facility]) 0.4  MG CAPS capsule Take 0.4 mg by mouth in the morning and at bedtime.  09/09/19  Yes [provider]  atorvastatin (LIPITOR) 40 MG tablet Take 1 tablet (40 mg total) by mouth daily. Patient not taking: Reported on 09/16/2019 03/05/18   Vivi Barrack, MD  fluticasone [facility]) 50 MCG/ACT nasal spray Place 2 sprays into both nostrils daily as needed for allergies or rhinitis. Patient not taking: Reported on 09/11/2019 07/28/17   Vivi Barrack, MD  rivaroxaban (XARELTO) 20 MG TABS tablet Take 1 tablet (20 mg total) by mouth daily with supper. Patient not taking: Reported on 09/11/2019 05/24/19   Gregor Hams, MD    Allergies    Effexor xr [venlafaxine hcl er], Acyclovir and related, Zocor [simvastatin], Dexilant [dexlansoprazole], Ivp dye [iodinated diagnostic agents], Soma [carisoprodol], and Sulfa antibiotics  Review of Systems   Review of Systems  Constitutional: Positive for fatigue and fever. Negative for chills.  HENT: Negative for congestion, rhinorrhea and sore throat.   Eyes: Negative for visual disturbance.  Respiratory: Negative for cough and shortness of breath.   Cardiovascular: Negative for chest pain and leg swelling.  Gastrointestinal:  Positive for abdominal pain. Negative for diarrhea, nausea and vomiting.  Genitourinary: Positive for difficulty urinating and dysuria.  Musculoskeletal: Positive for back pain. Negative for neck pain.  Skin: Negative for rash.  Neurological: Negative for dizziness, light-headedness and headaches.  Hematological: Does not bruise/bleed easily.  Psychiatric/Behavioral: Negative for confusion.    Physical Exam Updated Vital Signs BP 96/69   Pulse 86   Temp 99.6 F (37.6 C) (Oral)   Resp 17   Ht 1.829 m (6')   Wt 77 kg   SpO2 97%   BMI 23.02 kg/m   Physical Exam Vitals and nursing note reviewed.  Constitutional:      General: He is not in acute distress.    Appearance: Normal appearance. He is well-developed.  HENT:     Head: Normocephalic and atraumatic.  Eyes:     Extraocular Movements: Extraocular movements intact.     Conjunctiva/sclera: Conjunctivae normal.     Pupils: Pupils are equal, round, and reactive to light.  Cardiovascular:     Rate and Rhythm: Normal rate and regular rhythm.     Heart sounds: No murmur heard.   Pulmonary:     Effort: Pulmonary effort is normal. No respiratory distress.     Breath sounds: Normal breath sounds.  Abdominal:     General: There is no distension.     Palpations: Abdomen is soft.     Tenderness: There is no abdominal tenderness. There is no guarding.  Genitourinary:    Penis: Normal.   Musculoskeletal:        General: No swelling. Normal range of motion.     Cervical back: Normal range of motion and neck supple.  Skin:    General: Skin is warm and dry.     Capillary Refill: Capillary refill takes less than 2 seconds.  Neurological:     General: No focal deficit present.     Mental Status: He is alert and oriented to person, place, and time.     ED Results / Procedures / Treatments   Labs (all labs ordered are listed, but only abnormal results are displayed) Labs Reviewed  COMPREHENSIVE METABOLIC PANEL - Abnormal;  Notable for the following components:      Result Value   Sodium 132 (*)    Glucose, Bld 111 (*)    Calcium 7.6 (*)  Total Protein 5.8 (*)    Albumin 3.3 (*)    Total Bilirubin 1.3 (*)    All other components within normal limits  CBC WITH DIFFERENTIAL/PLATELET - Abnormal; Notable for the following components:   WBC 11.4 (*)    RBC 3.87 (*)    Hemoglobin 12.1 (*)    HCT 35.6 (*)    Neutro Abs 9.4 (*)    Monocytes Absolute 1.1 (*)    All other components within normal limits  BRAIN NATRIURETIC PEPTIDE - Abnormal; Notable for the following components:   B Natriuretic Peptide 142.0 (*)    All other components within normal limits  CULTURE, BLOOD (ROUTINE X 2)  CULTURE, BLOOD (ROUTINE X 2)  SARS CORONAVIRUS 2 BY RT PCR (HOSPITAL ORDER, [facility])  LIPASE, BLOOD  URINALYSIS, ROUTINE W REFLEX MICROSCOPIC  LACTIC ACID, PLASMA    EKG EKG Interpretation  Date/Time:  Friday September 23 2019 16:04:23 EDT Ventricular Rate:  81 PR Interval:    QRS Duration: 90 QT Interval:  354 QTC Calculation: 411 R Axis:   41 Text Interpretation: Sinus rhythm Low voltage, precordial leads Abnormal R-wave progression, early transition No significant change since last tracing Confirmed by Fredia Sorrow [phone]) on 09/23/2019 5:11:21 PM   Radiology CT Abdomen Pelvis Wo Contrast  Result Date: 09/23/2019 CLINICAL DATA:  Abdominal pain and fever EXAM: CT ABDOMEN AND PELVIS WITHOUT CONTRAST TECHNIQUE: Multidetector CT imaging of the abdomen and pelvis was performed following the standard protocol without IV contrast. COMPARISON:  03/18/2017 FINDINGS: Lower chest: Mild dependent atelectatic changes are noted. Hepatobiliary: No focal liver abnormality is seen. No gallstones, gallbladder wall thickening, or biliary dilatation. Pancreas: Unremarkable. No pancreatic ductal dilatation or surrounding inflammatory changes. Spleen: Normal in size without focal abnormality. Adrenals/Urinary  Tract: Adrenal glands are within normal limits. Kidneys are well visualized bilaterally. Scattered nonobstructing renal stones are again seen bilaterally worse on the left than the right. Previously seen left ureteral stent has been removed in the interval. No ureteral calculus is noted. The bladder is well distended. A small amount of air is again seen in the bladder consistent with prior instrumentation. Stomach/Bowel: Scattered diverticular change of the colon is noted without diverticulitis. No obstructive or inflammatory changes of the colon are noted. The appendix is within normal limits. No small bowel or gastric abnormality is seen with the exception of a small hiatal hernia. Vascular/Lymphatic: Aortic atherosclerosis. No enlarged abdominal or pelvic lymph nodes. Mild ectasia of the common iliac arteries bilaterally. This is stable from the prior exam. Reproductive: Prostate is well visualized seminal vesicles are unremarkable. Other: No free fluid is noted. Mild inflammatory changes are noted in the pelvis likely related to the recent surgery. Musculoskeletal: No acute or significant osseous findings. IMPRESSION: Scattered nonobstructing renal calculi bilaterally. Diverticulosis without diverticulitis. Mild inflammatory changes in the pelvis likely related to the recent surgery Electronically Signed   By: Inez Catalina M.D.   On: 09/23/2019 19:03   DG Chest Port 1 View  Result Date: 09/23/2019 CLINICAL DATA:  Fever and weakness. EXAM: PORTABLE CHEST 1 VIEW COMPARISON:  Chest x-ray dated July 03, 2017. FINDINGS: The heart size and mediastinal contours are within normal limits. Both lungs are clear. The visualized skeletal structures are unremarkable. IMPRESSION: No active disease. Electronically Signed   By: Titus Dubin M.D.   On: 09/23/2019 16:06    Procedures Procedures (including critical care time)  Medications Ordered in ED Medications  0.9 %  sodium chloride infusion (  Intravenous New  Bag/Given 09/23/19 1810)  sodium chloride 0.9 % bolus 500 mL (0 mLs Intravenous Stopped 09/23/19 1737)  ondansetron (ZOFRAN) injection 4 mg (4 mg Intravenous Given 09/23/19 1557)  acetaminophen (TYLENOL) tablet 1,000 mg (1,000 mg Oral Given 09/23/19 1558)    ED Course  I have reviewed the triage vital signs and the nursing notes.  Pertinent labs & imaging results that were available during my care of the patient were reviewed by me and considered in my medical decision making (see chart for details).    MDM Rules/Calculators/A&P                         Extensive work-up here tonight without any significant findings.  Patient's lactic acid was not elevated.  Bladder scan showed no significant urinary retention.  Urinalysis is without evidence of infection.  Does have a mild leukocytosis.  Covid testing was negative.  Chest x-ray negative CT scan abdomen pelvis with oral contrast without any acute findings [place] had some kidney stones up in the kidneys but no evidence of any ureteral stones.  No evidence of any pelvic infection or abscess.  Repeated blood pressures here remained about the same currently is like 023 systolic.  There was some recorded that were below but his arm was bent taken several with his arm straight and we got good pressures.  And his heart rate has been below 100.  Concerned about the fever to 102.  We do not have a good explanation for that.  Covid testing was negative as well as mention.  Blood cultures were sent and are pending.  Patient will be discharged home have him follow-up with urology primary care doctor general surgery.  Patient will return for any new or worse symptoms.  Final Clinical Impression(s) / ED Diagnoses Final diagnoses:  Difficulty urinating  Generalized abdominal pain  Fever, unspecified fever cause  Near syncope    Rx / DC Orders ED Discharge Orders    None       Fredia Sorrow, MD 09/24/19 0022

## 2019-09-24 ENCOUNTER — Telehealth: Payer: Self-pay | Admitting: Surgery

## 2019-09-24 NOTE — Telephone Encounter (Signed)
David Armstrong  Aug 30, 1950 [id number]  Patient Care Team: Vivi Barrack, MD as PCP - General (Family Medicine) Michael Boston, MD as Consulting Physician (General Surgery) Sable Feil, MD as Consulting Physician (Gastroenterology) Willia Craze, NP as Nurse Practitioner (Gastroenterology) [name], MD as Consulting Physician (Urology) [name], MD as Consulting Physician (Psychiatry)  This patient is a 69 y.o.male who calls today for surgical evaluation.   Date of procedure/visit: 5/27/202021  Surgery: Hemorrhoidal ligation/pexy.  Internal/external hemorrhoidectomy x2  Reason for call: Many questions/concerns  Patient with moderate Lee severe hemorrhoids underwent hemorrhoidal ligation and pexy and hemorrhoidectomy.  Had problems urination.  Was constipated.  Got his bowels moving.  Could not urinate well.  Saw urology.  Cath not placed but then could not void so went to the emergency room where urology had to place it in ED.   He returned to the ED the next day over concerns but Foley was working just fine.  He followed up with urology.  Apparently had Foley removed a couple of days ago.  Some burning when he urinates but urinating fine.  He was concerned about irregular bowels.  Initially constipated and then having diarrhea.  Was on MiraLAX but now is doing Colace and Dulcolax over-the-counter.  He feels like his appetite is not back to normal but no severe nausea vomiting today.  He did spit up yesterday.  He is not been walking much.  Denies any severe pain.  Not on narcotics anymore.  Cannot do sitz bath's so trying to use the shower.  He went to the emergency room yesterday.  Had vomited earlier.  CAT scan underwhelming for any perforation or abscess.  No SBO.  Able to urinate okay.  Normal chest x-ray.  Negative Covid.  Urinalysis underwhelming.  He called with concerns.  He is not having a fever today.  I recommend he take some Ensure shakes for  supplementation.  Get out and walk.  Stay on top of pain control.  Reread recommendations and follow instructions given by me.  Switch to Metamucil or different fiber supplement as Colace and Dulcolax may be giving him diarrhea & he is had problems adjusting Miralax down.  I tried to reassure him there is no evidence of any infection or leak.  He did not sound toxic or sickly, just worried.  He does confess his pain is less.  I recommend he follow-up with [place] in the office and keep his postop appointment.  He cannot recall it.  I believe it is within the next week.  Office closed & EMR system down, so I strongly recommend the patient call Monday morning when the office is open to make sure he has close follow-up.  I work to try and explain things as he seemed to get easily confused.  By the end, I think he understood things better.  Patient Active Problem List   Diagnosis Date Noted  . Chronic deep vein thrombosis (DVT) of calf muscle vein of right lower extremity ([place]) 05/24/2019  . Dyslipidemia 02/05/2018  . Seborrheic dermatitis 09/14/2017  . Inguinal hernia 06/05/2017  . Allergic rhinitis 04/23/2017  . Lumbar degenerative disc disease 07/31/2016  . History of pulmonary embolism and DVT (Oct 2017) 02/11/2016  . Nephrolithiasis 04/19/2014  . Peripheral neuropathy ([place]) 12/15/2013  . IBS (irritable bowel syndrome) 03/09/2012  . GERD 10/26/2007  . Anxiety state 11/23/2006  . Depression, major, single episode, mild ([place]) 11/23/2006    Past Medical History:  Diagnosis Date  .  Anxiety   . Arthritis    "lower back; hips;' right knee"   . Bipolar disorder ([place])   . Chronic constipation   . Depression   . Diverticulosis of colon   . Edema of both lower extremities    ANKLES  . Fatty liver   . GERD (gastroesophageal reflux disease)   . Hiatal hernia   . History of anal fissures   . History of deep vein thrombosis (DVT) of lower extremity 02/11/2016   right lower extremity distal and  proximal extensive acute  . History of Helicobacter pylori infection 2012; 2009  . History of kidney stones   . History of pneumonia 01/2015   CAP  . 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 Hyperlipidemia   . IBS (irritable bowel syndrome)   . Left ureteral stone   . Nocturia   . Peripheral neuropathy    per pt due to back DDD  . Personal history of colonic polyps    HYPERPLASTIC POLYP  . Pulmonary nodule, right    incidental finding CT chest angio 02-11-2016 stable  . TMJ (temporomandibular joint syndrome)   . Varicosities of leg     Past Surgical History:  Procedure Laterality Date  . BELPHAROPTOSIS REPAIR Bilateral 01/2015  . CATARACT EXTRACTION W/ INTRAOCULAR LENS  IMPLANT, BILATERAL  04/2016  . COLONOSCOPY WITH ESOPHAGOGASTRODUODENOSCOPY (EGD)  last one 03-02-2013  . CYSTOSCOPY/RETROGRADE/URETEROSCOPY/STONE EXTRACTION WITH BASKET  2010  . CYSTOSCOPY/URETEROSCOPY/HOLMIUM LASER/STENT PLACEMENT Left 03/16/2017   Procedure: CYSTOSCOPY/RETROGRADE/URETEROSCOPY/HOLMIUM LASER/STENT PLACEMENT, STONE BASKET EXTRACTION AND STENT PLACEMENT;  Surgeon: [name], MD;  Location: [facility];  Service: Urology;  Laterality: Left;  . EXCISIONAL HEMORRHOIDECTOMY  2015 approx.  Marland Kitchen EXTRACORPOREAL SHOCK WAVE LITHOTRIPSY  1987; 1992  . FOOT NEUROMA SURGERY Bilateral left 09/2015; right 04/ 2017   morton' neuroma  . FOOT NEUROMA SURGERY Right 2004   morton's neuroma and removal heel spur  . HAMMER TOE SURGERY Left 2013   5th toe  . INCISION AND DRAINAGE FOOT Left 1992   "ball of foot; stepped on piece of hard wire & it went thru my shoe"  . KNEE ARTHROSCOPY Bilateral left x2 1997;  right 2004 & 2005  . NASAL FRACTURE SURGERY  1997  . PATELLA-FEMORAL ARTHROPLASTY Right 07-06-2003   dr Eddie Dibbles [facility]  . PLANTAR FASCIA RELEASE Bilateral 1987 & 1993  . RHINOPLASTY  2000  . SHOULDER ARTHROSCOPY Left 2012;  2013  . SHOULDER ARTHROSCOPY WITH DISTAL  CLAVICLE RESECTION Right 07-31-2005   dr Percell Miller  [facility]   w/ Debridement labral and adhesions, acromioplasty, bursectomy, and CA ligament release  . TRANSTHORACIC ECHOCARDIOGRAM  02/12/2016   ef 96-29%, grade 1 diastolic dysfunction/  trivial TR    Social History   Socioeconomic History  . Marital status: Divorced    Spouse name: Not on file  . Number of children: 0  . Years of education: Not on file  . Highest education level: Not on file  Occupational History  . Occupation: retired    Fish farm manager: [place] [employer]    Comment: disabled now  Tobacco Use  . Smoking status: Never Smoker  . Smokeless tobacco: Never Used  Vaping Use  . Vaping Use: Never used  Substance and Sexual Activity  . Alcohol use: No    Alcohol/week: 0.0 standard drinks  . Drug use: No  . Sexual activity: Never  Other Topics Concern  . Not on file  Social History Narrative   Goes  by "[name]"   Divorced   No children   Retired from the post office   Has internet girlfriend on facebook from [place]:   . Difficulty of Paying Living Expenses:   Food Insecurity:   . Worried About Charity fundraiser in the Last Year:   . Arboriculturist in the Last Year:   Transportation Needs:   . Film/video editor (Medical):   Marland Kitchen Lack of Transportation (Non-Medical):   Physical Activity:   . Days of Exercise per Week:   . Minutes of Exercise per Session:   Stress:   . Feeling of Stress :   Social Connections:   . Frequency of Communication with Friends and Family:   . Frequency of Social Gatherings with Friends and Family:   . Attends Religious Services:   . Active Member of Clubs or Organizations:   . Attends Archivist Meetings:   Marland Kitchen Marital Status:   Intimate Partner Violence:   . Fear of Current or Ex-Partner:   . Emotionally Abused:   Marland Kitchen Physically Abused:   . Sexually Abused:     Family History  Problem Relation Age of Onset  . Heart  disease Mother   . Stroke Mother   . Cirrhosis Father   . Heart disease Father   . Skin cancer Father   . Prostate cancer Maternal Grandfather   . Parkinson's disease Paternal Grandfather   . Heart disease Brother   . Colon cancer Neg Hx   . Stomach cancer Neg Hx     Current Outpatient Medications  Medication Sig Dispense Refill  . aspirin EC 81 MG tablet Take 81 mg by mouth daily.    Marland Kitchen atorvastatin (LIPITOR) 40 MG tablet Take 1 tablet (40 mg total) by mouth daily. (Patient not taking: Reported on 09/16/2019) 90 tablet 3  . bisacodyl (DULCOLAX) 5 MG EC tablet Take 5 mg by mouth daily as needed for mild constipation or moderate constipation.    Marland Kitchen desonide (DESOWEN) 0.05 % lotion APPLY EXTERNALLY TO THE AFFECTED AREA EVERY DAY AS NEEDED (Patient taking differently: Apply 1 application topically daily as needed (skin irritation). ) 118 mL 2  . doxycycline (VIBRAMYCIN) 100 MG capsule Take 100 mg by mouth 2 (two) times daily. 15 day course starting on 09/19/2019    . fluticasone (FLONASE) 50 MCG/ACT nasal spray Place 2 sprays into both nostrils daily as needed for allergies or rhinitis. (Patient not taking: Reported on 09/11/2019) 17 g 11  . hyoscyamine (LEVSIN SL) 0.125 MG SL tablet DISSOLVE 1 TABLET(0.125 MG) UNDER THE TONGUE EVERY 6 HOURS AS NEEDED (Patient taking differently: Take 0.125 mg by mouth every 6 (six) hours as needed for cramping. ) 120 tablet 3  . lidocaine (XYLOCAINE) 5 % ointment Apply 1 application topically as needed. (Patient taking differently: Apply 1 application topically daily as needed for mild pain or moderate pain. ) 50 g 0  . LORazepam (ATIVAN) 1 MG tablet Take 0.5-1 tablets (0.5-1 mg total) by mouth every 8 (eight) hours as needed for anxiety. 90 tablet 0  . magnesium hydroxide (MILK OF MAGNESIA) 800 MG/5ML suspension Take 15 mLs by mouth daily as needed for constipation.    Marland Kitchen omeprazole (PRILOSEC) 20 MG capsule Take 1 capsule (20 mg total) by mouth daily. 30 capsule 3  .  rivaroxaban (XARELTO) 20 MG TABS tablet Take 1 tablet (20 mg total) by mouth daily with supper. (Patient  not taking: Reported on 09/11/2019) 90 tablet 0  . tamsulosin (FLOMAX) 0.4 MG CAPS capsule Take 0.4 mg by mouth in the morning and at bedtime.      No current facility-administered medications for this visit.     Allergies  Allergen Reactions  . Effexor Xr [Venlafaxine Hcl Er] Other (See Comments)    Insomnia  . Acyclovir And Related   . Zocor [Simvastatin] Other (See Comments)    Muscle ache/ pain  . Dexilant [Dexlansoprazole] Other (See Comments)    Muscle aches  . Ivp Dye [Iodinated Diagnostic Agents] Rash  . Soma [Carisoprodol] Other (See Comments)    Sores on arm  . Sulfa Antibiotics Rash    @VS @  CT Abdomen Pelvis Wo Contrast  Result Date: 09/23/2019 CLINICAL DATA:  Abdominal pain and fever EXAM: CT ABDOMEN AND PELVIS WITHOUT CONTRAST TECHNIQUE: Multidetector CT imaging of the abdomen and pelvis was performed following the standard protocol without IV contrast. COMPARISON:  03/18/2017 FINDINGS: Lower chest: Mild dependent atelectatic changes are noted. Hepatobiliary: No focal liver abnormality is seen. No gallstones, gallbladder wall thickening, or biliary dilatation. Pancreas: Unremarkable. No pancreatic ductal dilatation or surrounding inflammatory changes. Spleen: Normal in size without focal abnormality. Adrenals/Urinary Tract: Adrenal glands are within normal limits. Kidneys are well visualized bilaterally. Scattered nonobstructing renal stones are again seen bilaterally worse on the left than the right. Previously seen left ureteral stent has been removed in the interval. No ureteral calculus is noted. The bladder is well distended. A small amount of air is again seen in the bladder consistent with prior instrumentation. Stomach/Bowel: Scattered diverticular change of the colon is noted without diverticulitis. No obstructive or inflammatory changes of the colon are noted. The  appendix is within normal limits. No small bowel or gastric abnormality is seen with the exception of a small hiatal hernia. Vascular/Lymphatic: Aortic atherosclerosis. No enlarged abdominal or pelvic lymph nodes. Mild ectasia of the common iliac arteries bilaterally. This is stable from the prior exam. Reproductive: Prostate is well visualized seminal vesicles are unremarkable. Other: No free fluid is noted. Mild inflammatory changes are noted in the pelvis likely related to the recent surgery. Musculoskeletal: No acute or significant osseous findings. IMPRESSION: Scattered nonobstructing renal calculi bilaterally. Diverticulosis without diverticulitis. Mild inflammatory changes in the pelvis likely related to the recent surgery Electronically Signed   By: Inez Catalina M.D.   On: 09/23/2019 19:03   DG Chest Port 1 View  Result Date: 09/23/2019 CLINICAL DATA:  Fever and weakness. EXAM: PORTABLE CHEST 1 VIEW COMPARISON:  Chest x-ray dated July 03, 2017. FINDINGS: The heart size and mediastinal contours are within normal limits. Both lungs are clear. The visualized skeletal structures are unremarkable. IMPRESSION: No active disease. Electronically Signed   By: Titus Dubin M.D.   On: 09/23/2019 16:06    Note: This dictation was prepared with Dragon/digital dictation along with [employer]. Any transcriptional errors that result from this process are unintentional.   .Adin Hector, M.D., F.A.C.S. Gastrointestinal and Minimally Invasive Surgery [facility] [address] [place], [place] [address] [phone] Main / Paging  09/24/2019 4:10 PM

## 2019-09-24 NOTE — Discharge Instructions (Signed)
Return for any new or worse symptoms.  Follow-up with urology.  Follow-up with your primary care doctor.  Follow-up with general surgery as planned.  Today's work-up including CT scan of the abdomen without evidence of any urinary retention urinary tract infection or any abnormalities inside the abdomen.

## 2019-09-27 DIAGNOSIS — M5137 Other intervertebral disc degeneration, lumbosacral region: Secondary | ICD-10-CM | POA: Diagnosis not present

## 2019-09-27 DIAGNOSIS — M9905 Segmental and somatic dysfunction of pelvic region: Secondary | ICD-10-CM | POA: Diagnosis not present

## 2019-09-27 DIAGNOSIS — M9904 Segmental and somatic dysfunction of sacral region: Secondary | ICD-10-CM | POA: Diagnosis not present

## 2019-09-27 DIAGNOSIS — M9903 Segmental and somatic dysfunction of lumbar region: Secondary | ICD-10-CM | POA: Diagnosis not present

## 2019-09-28 DIAGNOSIS — M9904 Segmental and somatic dysfunction of sacral region: Secondary | ICD-10-CM | POA: Diagnosis not present

## 2019-09-28 DIAGNOSIS — M5137 Other intervertebral disc degeneration, lumbosacral region: Secondary | ICD-10-CM | POA: Diagnosis not present

## 2019-09-28 DIAGNOSIS — M9903 Segmental and somatic dysfunction of lumbar region: Secondary | ICD-10-CM | POA: Diagnosis not present

## 2019-09-28 DIAGNOSIS — M9905 Segmental and somatic dysfunction of pelvic region: Secondary | ICD-10-CM | POA: Diagnosis not present

## 2019-09-28 LAB — CULTURE, BLOOD (ROUTINE X 2)
Culture: NO GROWTH
Culture: NO GROWTH
Special Requests: ADEQUATE
Special Requests: ADEQUATE

## 2019-09-29 DIAGNOSIS — M9905 Segmental and somatic dysfunction of pelvic region: Secondary | ICD-10-CM | POA: Diagnosis not present

## 2019-09-29 DIAGNOSIS — M9904 Segmental and somatic dysfunction of sacral region: Secondary | ICD-10-CM | POA: Diagnosis not present

## 2019-09-29 DIAGNOSIS — M5137 Other intervertebral disc degeneration, lumbosacral region: Secondary | ICD-10-CM | POA: Diagnosis not present

## 2019-09-29 DIAGNOSIS — M9903 Segmental and somatic dysfunction of lumbar region: Secondary | ICD-10-CM | POA: Diagnosis not present

## 2019-09-29 DIAGNOSIS — R300 Dysuria: Secondary | ICD-10-CM | POA: Diagnosis not present

## 2019-10-11 ENCOUNTER — Encounter: Payer: Self-pay | Admitting: Family Medicine

## 2019-10-11 ENCOUNTER — Ambulatory Visit (INDEPENDENT_AMBULATORY_CARE_PROVIDER_SITE_OTHER): Payer: Medicare Other | Admitting: Family Medicine

## 2019-10-11 ENCOUNTER — Other Ambulatory Visit: Payer: Self-pay

## 2019-10-11 VITALS — BP 108/68 | HR 92 | Temp 98.3°F | Ht 72.0 in | Wt 169.4 lb

## 2019-10-11 DIAGNOSIS — N390 Urinary tract infection, site not specified: Secondary | ICD-10-CM | POA: Diagnosis not present

## 2019-10-11 DIAGNOSIS — M5136 Other intervertebral disc degeneration, lumbar region: Secondary | ICD-10-CM | POA: Diagnosis not present

## 2019-10-11 LAB — CBC WITH DIFFERENTIAL/PLATELET
Basophils Absolute: 0.1 10*3/uL (ref 0.0–0.1)
Basophils Relative: 0.9 % (ref 0.0–3.0)
Eosinophils Absolute: 0.2 10*3/uL (ref 0.0–0.7)
Eosinophils Relative: 4.2 % (ref 0.0–5.0)
HCT: 41.6 % (ref 39.0–52.0)
Hemoglobin: 13.8 g/dL (ref 13.0–17.0)
Lymphocytes Relative: 26.6 % (ref 12.0–46.0)
Lymphs Abs: 1.6 10*3/uL (ref 0.7–4.0)
MCHC: 33.3 g/dL (ref 30.0–36.0)
MCV: 91.7 fl (ref 78.0–100.0)
Monocytes Absolute: 0.7 10*3/uL (ref 0.1–1.0)
Monocytes Relative: 11.3 % (ref 3.0–12.0)
Neutro Abs: 3.3 10*3/uL (ref 1.4–7.7)
Neutrophils Relative %: 57 % (ref 43.0–77.0)
Platelets: 220 10*3/uL (ref 150.0–400.0)
RBC: 4.53 Mil/uL (ref 4.22–5.81)
RDW: 14 % (ref 11.5–15.5)
WBC: 5.8 10*3/uL (ref 4.0–10.5)

## 2019-10-11 LAB — C-REACTIVE PROTEIN: CRP: <1 mg/dL (ref 0.5–20.0)

## 2019-10-11 LAB — COMPREHENSIVE METABOLIC PANEL
ALT: 15 U/L (ref 0–53)
AST: 18 U/L (ref 0–37)
Albumin: 4.1 g/dL (ref 3.5–5.2)
Alkaline Phosphatase: 120 U/L — ABNORMAL HIGH (ref 39–117)
BUN: 17 mg/dL (ref 6–23)
CO2: 26 mEq/L (ref 19–32)
Calcium: 9.2 mg/dL (ref 8.4–10.5)
Chloride: 104 mEq/L (ref 96–112)
Creatinine, Ser: 0.88 mg/dL (ref 0.40–1.50)
GFR: 85.98 mL/min (ref >60.00)
Glucose, Bld: 83 mg/dL (ref 70–99)
Potassium: 4 mEq/L (ref 3.5–5.1)
Sodium: 139 mEq/L (ref 135–145)
Total Bilirubin: 0.6 mg/dL (ref 0.2–1.2)
Total Protein: 6.7 g/dL (ref 6.0–8.3)

## 2019-10-11 LAB — SEDIMENTATION RATE: Sed Rate: 11 mm/hr (ref 0–20)

## 2019-10-11 MED ORDER — METHYLPREDNISOLONE ACETATE 80 MG/ML IJ SUSP
80.0000 mg | Freq: Once | INTRAMUSCULAR | Status: AC
  Administered 2019-10-11: 80 mg via INTRAMUSCULAR

## 2019-10-11 MED ORDER — KETOROLAC TROMETHAMINE 60 MG/2ML IM SOLN
60.0000 mg | Freq: Once | INTRAMUSCULAR | Status: AC
  Administered 2019-10-11: 60 mg via INTRAMUSCULAR

## 2019-10-11 NOTE — Progress Notes (Signed)
David Armstrong is a 69 y.o. male who presents today for an office visit.  Assessment/Plan:  New/Acute Problems: Low back pain No vertebral tenderness.  Likely has a flareup of lumbar degenerative disc disease.  No red flags at this time.  Did have a recent fever but has not had any fevers over past couple of weeks, no fever today no signs of systemic infection -doubt discitis.  Will check CBC, C met, CRP, and sedimentation rate.  Will give 60 mg of Toradol and 80 mg of Depo-Medrol this is worked well for him in the past with previous flareups of low back pain.  Will avoid tramadol and other narcotics at this point due to recent issues with constipation.  UTI Symptoms resolved with Macrobid.  Will check urine culture per request.    Subjective:  HPI:  Patient here with low back pain.  Started a few weeks ago.  Located mid low back.  No radiation.  No weakness or numbness.  He has had some issues with urinary retention and is seeing a urologist for this.  This is at baseline.  He was seen in the ED 3 weeks ago with difficulty urinating and fever.  Had work-up at that time including chest x-ray, labs, and CT abdomen pelvis.  There were no signs of any infection or abscess.  Went to the urologist later and had a urine culture which showed E. coli.  He was started on Macrobid.  Symptoms have improved since then.  No further fevers.  No dysuria.  Back pain is worse with certain motions.  Worse with laying down.  Went to chiropractor which helped for a day or so.       Objective:  Physical Exam: BP 108/68   Pulse 92   Temp 98.3 F (36.8 C)   Ht 6' (1.829 m)   Wt 169 lb 6.1 oz (76.8 kg)   SpO2 96%   BMI 22.97 kg/m   Gen: No acute distress, resting comfortably CV: Regular rate and rhythm with no murmurs appreciated Pulm: Normal work of breathing, clear to auscultation bilaterally with no crackles, wheezes, or rhonchi MSK: - Back: No deformities. Surgical scars present. No pain with  percussion of vertebrae. Pain along paraspinal muscles - Legs: FROM throughout. Strength 5/5 in all directions. Sensation to light touch intact throughout. Reflexes 2+ and symmetric bilaterally.  Neuro: Grossly normal, moves all extremities Psych: Normal affect and thought content  Time Spent: 45 minutes of total time was spent on the date of the encounter performing the following actions: chart review prior to seeing the patient including recent visit to the ED, obtaining history, performing a medically necessary exam, counseling on the treatment plan, placing orders, and documenting in our EHR.        [name]. [name], MD 10/11/2019 12:15 PM

## 2019-10-11 NOTE — Addendum Note (Signed)
Addended by: Loralyn Freshwater on: 10/11/2019 01:34 PM   Modules accepted: Orders

## 2019-10-11 NOTE — Patient Instructions (Signed)
It was very nice to see you today!  We will give you injections for you back pain today.  We will also check blood work and a urine sample today.   Let me know if your pain is not improving over the next few days.  Take care, Dr [name]  Please try these tips to maintain a healthy lifestyle:   Eat at least 3 REAL meals and 1-2 snacks per day.  Aim for no more than 5 hours between eating.  If you eat breakfast, please do so within one hour of getting up.    Each meal should contain half fruits/vegetables, one quarter protein, and one quarter carbs (no bigger than a computer mouse)   Cut down on sweet beverages. This includes juice, soda, and sweet tea.     Drink at least 1 glass of water with each meal and aim for at least 8 glasses per day   Exercise at least 150 minutes every week.

## 2019-10-13 LAB — URINE CULTURE
MICRO NUMBER:: 10647390
SPECIMEN QUALITY:: ADEQUATE

## 2019-10-14 ENCOUNTER — Other Ambulatory Visit: Payer: Self-pay

## 2019-10-14 MED ORDER — SULFAMETHOXAZOLE-TRIMETHOPRIM 800-160 MG PO TABS: 1.0000 | ORAL_TABLET | Freq: Two times a day (BID) | ORAL | 0 refills | Status: DC

## 2019-10-14 NOTE — Progress Notes (Signed)
Please inform patient of the following:  Culture still shows e coli in his urine. Blood work is NORMAL. Please send in bactrim DS tablets take 1 tablet twice daily x 7 days.  David Armstrong. [name], MD 10/14/2019 9:42 AM

## 2019-10-25 ENCOUNTER — Other Ambulatory Visit: Payer: Medicare Other

## 2019-10-25 ENCOUNTER — Other Ambulatory Visit: Payer: Self-pay

## 2019-10-25 ENCOUNTER — Telehealth: Payer: Self-pay

## 2019-10-25 DIAGNOSIS — N390 Urinary tract infection, site not specified: Secondary | ICD-10-CM

## 2019-10-25 NOTE — Telephone Encounter (Signed)
Please schedule pt for lab visit. Orders will be placed.  Thank You

## 2019-10-25 NOTE — Telephone Encounter (Signed)
Pt wants his urine tested to see if bacteria is still in it after taking his antibiotic. Does he need an office visit for this or can labs be ordered?

## 2019-10-26 ENCOUNTER — Ambulatory Visit: Payer: Medicare Other | Attending: Internal Medicine

## 2019-10-26 DIAGNOSIS — Z20822 Contact with and (suspected) exposure to covid-19: Secondary | ICD-10-CM | POA: Diagnosis not present

## 2019-10-26 LAB — URINE CULTURE
MICRO NUMBER:: 10698697
Result:: NO GROWTH
SPECIMEN QUALITY:: ADEQUATE

## 2019-10-27 LAB — SARS-COV-2, NAA 2 DAY TAT

## 2019-10-27 LAB — NOVEL CORONAVIRUS, NAA: SARS-CoV-2, NAA: NOT DETECTED

## 2019-11-02 ENCOUNTER — Other Ambulatory Visit: Payer: Self-pay

## 2019-11-02 ENCOUNTER — Encounter: Payer: Self-pay | Admitting: Family Medicine

## 2019-11-02 ENCOUNTER — Ambulatory Visit (INDEPENDENT_AMBULATORY_CARE_PROVIDER_SITE_OTHER): Payer: Medicare Other | Admitting: Family Medicine

## 2019-11-02 VITALS — BP 113/78 | HR 70 | Temp 98.4°F | Ht 72.0 in | Wt 168.2 lb

## 2019-11-02 DIAGNOSIS — I82561 Chronic embolism and thrombosis of right calf muscular vein: Secondary | ICD-10-CM

## 2019-11-02 DIAGNOSIS — R109 Unspecified abdominal pain: Secondary | ICD-10-CM | POA: Diagnosis not present

## 2019-11-02 DIAGNOSIS — N200 Calculus of kidney: Secondary | ICD-10-CM

## 2019-11-02 MED ORDER — METHYLPREDNISOLONE ACETATE 80 MG/ML IJ SUSP
80.0000 mg | Freq: Once | INTRAMUSCULAR | Status: AC
  Administered 2019-11-02: 80 mg via INTRAMUSCULAR

## 2019-11-02 MED ORDER — KETOROLAC TROMETHAMINE 60 MG/2ML IM SOLN
60.0000 mg | Freq: Once | INTRAMUSCULAR | Status: AC
  Administered 2019-11-02: 60 mg via INTRAMUSCULAR

## 2019-11-02 NOTE — Assessment & Plan Note (Signed)
Check UA and CMET. Will be following up with urology next month.

## 2019-11-02 NOTE — Progress Notes (Signed)
   David Armstrong is a 69 y.o. male who presents today for an office visit.  Assessment/Plan:  New/Acute Problems: Abdominal Pain No red flags. Benign abdominal exam. Most likely muscular. Will give toradol 60mg  and depomedrol 80mg  daily IM today. Would avoid oral NSAIDs while he is on xarelto. Will also check CBC, CMET and UA. Encouraged good oral hydration. Discussed reasons to return to care.   Leg Swelling HE has been off anticoagulation for several weeks. Concern that he may have acute on chronic DVT. Will recheck ultrasound today. He will continue taking xarelto.   Chronic Problems Addressed Today: Chronic deep vein thrombosis (DVT) of calf muscle vein of right lower extremity ([place]) See above. Concern that he may have developed new clot as he stopped anticoagulation for a few weeks. He has been back on anticoagulation for a week now. Will check leg ultrasound.   Nephrolithiasis Check UA and CMET. Will be following up with urology next month.      Subjective:  HPI:  Recently had URI symptoms for about 3 days ago about a week ago.  Symptoms at that time include cough, sneeze, rhinorrhea. Cough was productive of green sputum. Was tested for covid which was negative.   He is now having RUQ abdominal pain with cough. He is also having back pain with cough. No nausea or vomiting. No fevers or chills. No diarrhea. Normal appetite. He has also had some swelling in his right leg.   He was diagnosed with DVT about 4 months ago and has been on xarelto. He stopped taking it for a couple weeks for a urologic procedure. He recently started back on it.        Objective:  Physical Exam: BP 113/78   Pulse 70   Temp 98.4 F (36.9 C)   Ht 6' (1.829 m)   Wt 168 lb 3.2 oz (76.3 kg)   SpO2 98%   BMI 22.81 kg/m   Gen: No acute distress, resting comfortably CV: Regular rate and rhythm with no murmurs appreciated Pulm: Normal work of breathing, clear to auscultation bilaterally with no  crackles, wheezes, or rhonchi GI: Soft, nondistended. Nontender. Bowel sounds present Neuro: Grossly normal, moves all extremities Psych: Normal affect and thought content  Time Spent: 45 minutes of total time was spent on the date of the encounter performing the following actions: chart review prior to seeing the patient, obtaining history, performing a medically necessary exam including focus abdominal exam, counseling on the treatment plan, placing orders, and documenting in our EHR.        [name]. [name], MD 11/02/2019 10:59 AM

## 2019-11-02 NOTE — Patient Instructions (Addendum)
It was very nice to see you today!  I think you probably have a strained muscle. We will give you two injections today. We will also check blood work and urine today.  I am concerned that the blood clot in your lungs may have worsened. We need to get an ultrasound of the area.  Please let me know if your symptoms are not improving over the next few days.   Take care, Dr [name]  Please try these tips to maintain a healthy lifestyle:   Eat at least 3 REAL meals and 1-2 snacks per day.  Aim for no more than 5 hours between eating.  If you eat breakfast, please do so within one hour of getting up.    Each meal should contain half fruits/vegetables, one quarter protein, and one quarter carbs (no bigger than a computer mouse)   Cut down on sweet beverages. This includes juice, soda, and sweet tea.     Drink at least 1 glass of water with each meal and aim for at least 8 glasses per day   Exercise at least 150 minutes every week.

## 2019-11-02 NOTE — Assessment & Plan Note (Signed)
See above. Concern that he may have developed new clot as he stopped anticoagulation for a few weeks. He has been back on anticoagulation for a week now. Will check leg ultrasound.

## 2019-11-02 NOTE — Addendum Note (Signed)
Addended by: Loralyn Freshwater on: 11/02/2019 11:22 AM   Modules accepted: Orders

## 2019-11-03 ENCOUNTER — Other Ambulatory Visit: Payer: Self-pay | Admitting: *Deleted

## 2019-11-03 ENCOUNTER — Other Ambulatory Visit: Payer: Self-pay

## 2019-11-03 ENCOUNTER — Ambulatory Visit (HOSPITAL_COMMUNITY)
Admission: RE | Admit: 2019-11-03 | Discharge: 2019-11-03 | Disposition: A | Discharge status: Home / Self Care | Payer: Medicare Other | Source: Ambulatory Visit | Attending: Surgery | Admitting: Surgery

## 2019-11-03 DIAGNOSIS — I82561 Chronic embolism and thrombosis of right calf muscular vein: Secondary | ICD-10-CM

## 2019-11-03 DIAGNOSIS — N200 Calculus of kidney: Secondary | ICD-10-CM

## 2019-11-03 LAB — CBC
HCT: 46.2 % (ref 38.5–50.0)
Hemoglobin: 15 g/dL (ref 13.2–17.1)
MCH: 30.5 pg (ref 27.0–33.0)
MCHC: 32.5 g/dL (ref 32.0–36.0)
MCV: 94.1 fL (ref 80.0–100.0)
MPV: 9.9 fL (ref 7.5–12.5)
Platelets: 164 10*3/uL (ref 140–400)
RBC: 4.91 10*6/uL (ref 4.20–5.80)
RDW: 13.1 % (ref 11.0–15.0)
WBC: 5.9 10*3/uL (ref 3.8–10.8)

## 2019-11-03 LAB — URINALYSIS, ROUTINE W REFLEX MICROSCOPIC
Bilirubin Urine: NEGATIVE
Glucose, UA: NEGATIVE
Hgb urine dipstick: NEGATIVE
Ketones, ur: NEGATIVE
Leukocytes,Ua: NEGATIVE
Nitrite: NEGATIVE
Protein, ur: NEGATIVE
Specific Gravity, Urine: 1.02 (ref 1.001–1.03)
pH: <=5 (ref 5.0–8.0)

## 2019-11-03 LAB — COMPREHENSIVE METABOLIC PANEL
AG Ratio: 1.8 (calc) (ref 1.0–2.5)
ALT: 15 U/L (ref 9–46)
AST: 17 U/L (ref 10–35)
Albumin: 4.2 g/dL (ref 3.6–5.1)
Alkaline phosphatase (APISO): 104 U/L (ref 35–144)
BUN: 18 mg/dL (ref 7–25)
CO2: 27 mmol/L (ref 20–32)
Calcium: 9.3 mg/dL (ref 8.6–10.3)
Chloride: 107 mmol/L (ref 98–110)
Creat: 0.92 mg/dL (ref 0.70–1.25)
Globulin: 2.4 g/dL (calc) (ref 1.9–3.7)
Glucose, Bld: 75 mg/dL (ref 65–99)
Potassium: 4.2 mmol/L (ref 3.5–5.3)
Sodium: 142 mmol/L (ref 135–146)
Total Bilirubin: 0.6 mg/dL (ref 0.2–1.2)
Total Protein: 6.6 g/dL (ref 6.1–8.1)

## 2019-11-03 LAB — URINE CULTURE
MICRO NUMBER:: 10732355
Result:: NO GROWTH
SPECIMEN QUALITY:: ADEQUATE

## 2019-11-03 NOTE — Progress Notes (Signed)
Lower venous duplex has been completed. Called results to [facility] at Dr. Marigene Ehlers office.  June Leap, BS, RDMS, RVT

## 2019-11-04 NOTE — Progress Notes (Signed)
Please inform patient of the following:  Ultrasound showed no DVT but he still has a small superficial clot. Blood work all NORMAL. Would like for him to keep legs elevated and let us know if swelling does not improve. Do not need to make any other changes to treatment plan at this time.  [name]. [name], MD 11/04/2019 2:26 PM

## 2019-11-10 DIAGNOSIS — R200 Anesthesia of skin: Secondary | ICD-10-CM | POA: Diagnosis not present

## 2019-11-10 DIAGNOSIS — R519 Headache, unspecified: Secondary | ICD-10-CM | POA: Diagnosis not present

## 2019-11-10 DIAGNOSIS — M542 Cervicalgia: Secondary | ICD-10-CM | POA: Diagnosis not present

## 2019-11-10 DIAGNOSIS — M545 Low back pain: Secondary | ICD-10-CM | POA: Diagnosis not present

## 2019-11-14 ENCOUNTER — Ambulatory Visit (INDEPENDENT_AMBULATORY_CARE_PROVIDER_SITE_OTHER): Payer: Medicare Other

## 2019-11-14 DIAGNOSIS — Z0000 Encounter for general adult medical examination without abnormal findings: Secondary | ICD-10-CM

## 2019-11-14 NOTE — Patient Instructions (Addendum)
David Armstrong , Thank you for taking time to come for your Medicare Wellness Visit. I appreciate your ongoing commitment to your health goals. Please review the following plan we discussed and let me know if I can assist you in the future.   Screening recommendations/referrals: Colonoscopy: Done 03/02/13 Recommended yearly ophthalmology/optometry visit for glaucoma screening and checkup Recommended yearly dental visit for hygiene and checkup  Vaccinations: Influenza vaccine: Up to date Pneumococcal vaccine: Up to date Tdap vaccine: Up to date Shingles vaccine: Completed 4/1  & 09/12/16 Covid-19: Completed 06/15/19 & 07/13/19  Advanced directives: Advance directive discussed with you today. I have provided a copy for you to complete at home and have notarized. Once this is complete please bring a copy in to our office so we can scan it into your chart.  Conditions/risks identified: stay healthy  Next appointment: Follow up in one year for your annual wellness visit.   Preventive Care 4 Years and Older, Male Preventive care refers to lifestyle choices and visits with your health care provider that can promote health and wellness. What does preventive care include?  A yearly physical exam. This is also called an annual well check.  Dental exams once or twice a year.  Routine eye exams. Ask your health care provider how often you should have your eyes checked.  Personal lifestyle choices, including:  Daily care of your teeth and gums.  Regular physical activity.  Eating a healthy diet.  Avoiding tobacco and drug use.  Limiting alcohol use.  Practicing safe sex.  Taking low doses of aspirin every day.  Taking vitamin and mineral supplements as recommended by your health care provider. What happens during an annual well check? The services and screenings done by your health care provider during your annual well check will depend on your age, overall health, lifestyle risk  factors, and family history of disease. Counseling  Your health care provider may ask you questions about your:  Alcohol use.  Tobacco use.  Drug use.  Emotional well-being.  Home and relationship well-being.  Sexual activity.  Eating habits.  History of falls.  Memory and ability to understand (cognition).  Work and work Statistician. Screening  You may have the following tests or measurements:  Height, weight, and BMI.  Blood pressure.  Lipid and cholesterol levels. These may be checked every 5 years, or more frequently if you are over 36 years old.  Skin check.  Lung cancer screening. You may have this screening every year starting at age 56 if you have a 30-pack-year history of smoking and currently smoke or have quit within the past 15 years.  Fecal occult blood test (FOBT) of the stool. You may have this test every year starting at age 57.  Flexible sigmoidoscopy or colonoscopy. You may have a sigmoidoscopy every 5 years or a colonoscopy every 10 years starting at age 41.  Prostate cancer screening. Recommendations will vary depending on your family history and other risks.  Hepatitis C blood test.  Hepatitis B blood test.  Sexually transmitted disease (STD) testing.  Diabetes screening. This is done by checking your blood sugar (glucose) after you have not eaten for a while (fasting). You may have this done every 1-3 years.  Abdominal aortic aneurysm (AAA) screening. You may need this if you are a current or former smoker.  Osteoporosis. You may be screened starting at age 48 if you are at high risk. Talk with your health care provider about your test results, treatment options,  and if necessary, the need for more tests. Vaccines  Your health care provider may recommend certain vaccines, such as:  Influenza vaccine. This is recommended every year.  Tetanus, diphtheria, and acellular pertussis (Tdap, Td) vaccine. You may need a Td booster every 10  years.  Zoster vaccine. You may need this after age 14.  Pneumococcal 13-valent conjugate (PCV13) vaccine. One dose is recommended after age 1.  Pneumococcal polysaccharide (PPSV23) vaccine. One dose is recommended after age 46. Talk to your health care provider about which screenings and vaccines you need and how often you need them. This information is not intended to replace advice given to you by your health care provider. Make sure you discuss any questions you have with your health care provider. Document Released: 04/27/2015 Document Revised: 12/19/2015 Document Reviewed: 01/30/2015 Elsevier Interactive Patient Education  2017 [employer] Prevention in the Home Falls can cause injuries. They can happen to people of all ages. There are many things you can do to make your home safe and to help prevent falls. What can I do on the outside of my home?  Regularly fix the edges of walkways and driveways and fix any cracks.  Remove anything that might make you trip as you walk through a door, such as a raised step or threshold.  Trim any bushes or trees on the path to your home.  Use bright outdoor lighting.  Clear any walking paths of anything that might make someone trip, such as rocks or tools.  Regularly check to see if handrails are loose or broken. Make sure that both sides of any steps have handrails.  Any raised decks and porches should have guardrails on the edges.  Have any leaves, snow, or ice cleared regularly.  Use sand or salt on walking paths during winter.  Clean up any spills in your garage right away. This includes oil or grease spills. What can I do in the bathroom?  Use night lights.  Install grab bars by the toilet and in the tub and shower. Do not use towel bars as grab bars.  Use non-skid mats or decals in the tub or shower.  If you need to sit down in the shower, use a plastic, non-slip stool.  Keep the floor dry. Clean up any water that  spills on the floor as soon as it happens.  Remove soap buildup in the tub or shower regularly.  Attach bath mats securely with double-sided non-slip rug tape.  Do not have throw rugs and other things on the floor that can make you trip. What can I do in the bedroom?  Use night lights.  Make sure that you have a light by your bed that is easy to reach.  Do not use any sheets or blankets that are too big for your bed. They should not hang down onto the floor.  Have a firm chair that has side arms. You can use this for support while you get dressed.  Do not have throw rugs and other things on the floor that can make you trip. What can I do in the kitchen?  Clean up any spills right away.  Avoid walking on wet floors.  Keep items that you use a lot in easy-to-reach places.  If you need to reach something above you, use a strong step stool that has a grab bar.  Keep electrical cords out of the way.  Do not use floor polish or wax that makes floors slippery. If  you must use wax, use non-skid floor wax.  Do not have throw rugs and other things on the floor that can make you trip. What can I do with my stairs?  Do not leave any items on the stairs.  Make sure that there are handrails on both sides of the stairs and use them. Fix handrails that are broken or loose. Make sure that handrails are as long as the stairways.  Check any carpeting to make sure that it is firmly attached to the stairs. Fix any carpet that is loose or worn.  Avoid having throw rugs at the top or bottom of the stairs. If you do have throw rugs, attach them to the floor with carpet tape.  Make sure that you have a light switch at the top of the stairs and the bottom of the stairs. If you do not have them, ask someone to add them for you. What else can I do to help prevent falls?  Wear shoes that:  Do not have high heels.  Have rubber bottoms.  Are comfortable and fit you well.  Are closed at the  toe. Do not wear sandals.  If you use a stepladder:  Make sure that it is fully opened. Do not climb a closed stepladder.  Make sure that both sides of the stepladder are locked into place.  Ask someone to hold it for you, if possible.  Clearly mark and make sure that you can see:  Any grab bars or handrails.  First and last steps.  Where the edge of each step is.  Use tools that help you move around (mobility aids) if they are needed. These include:  Canes.  Walkers.  Scooters.  Crutches.  Turn on the lights when you go into a dark area. Replace any light bulbs as soon as they burn out.  Set up your furniture so you have a clear path. Avoid moving your furniture around.  If any of your floors are uneven, fix them.  If there are any pets around you, be aware of where they are.  Review your medicines with your doctor. Some medicines can make you feel dizzy. This can increase your chance of falling. Ask your doctor what other things that you can do to help prevent falls. This information is not intended to replace advice given to you by your health care provider. Make sure you discuss any questions you have with your health care provider. Document Released: 01/25/2009 Document Revised: 09/06/2015 Document Reviewed: 05/05/2014 Elsevier Interactive Patient Education  2017 [employer].

## 2019-11-14 NOTE — Progress Notes (Signed)
Virtual Visit via Telephone Note  I connected with  David Armstrong on 11/14/19 at  1:00 PM EDT by telephone and verified that I am speaking with the correct person using two identifiers.  Medicare Annual Wellness visit completed telephonically due to Covid-19 pandemic.   Persons participating in this call: This Health Coach and this patient.   Location: Patient: Home Provider: Office   I discussed the limitations, risks, security and privacy concerns of performing an evaluation and management service by telephone and the availability of in person appointments. The patient expressed understanding and agreed to proceed.  Unable to perform video visit due to video visit attempted and failed and/or patient does not have video capability.   Some vital signs may be absent or patient reported.   David Brace, LPN    Subjective:   David Armstrong is a 69 y.o. male who presents for Medicare Annual/Subsequent preventive examination.  Review of Systems     Cardiac Risk Factors include: sedentary lifestyle;dyslipidemia;male gender     Objective:    There were no vitals filed for this visit. There is no height or weight on file to calculate BMI.  Advanced Directives 11/14/2019 09/11/2019 09/10/2019 05/05/2017 03/18/2017 03/16/2017 11/11/2016  Does Patient Have a Medical Advance Directive? No No No No No No No  Would patient like information on creating a medical advance directive? Yes (MAU/Ambulatory/Procedural Areas - Information given) - No - Patient declined Yes (MAU/Ambulatory/Procedural Areas - Information given) No - Patient declined No - Patient declined No - Patient declined    Current Medications (verified) Outpatient Encounter Medications as of 11/14/2019  Medication Sig  . desonide (DESOWEN) 0.05 % lotion APPLY EXTERNALLY TO THE AFFECTED AREA EVERY DAY AS NEEDED (Patient taking differently: Apply 1 application topically daily as needed (skin irritation). )  . hyoscyamine  (LEVSIN SL) 0.125 MG SL tablet DISSOLVE 1 TABLET(0.125 MG) UNDER THE TONGUE EVERY 6 HOURS AS NEEDED (Patient taking differently: Take 0.125 mg by mouth every 6 (six) hours as needed for cramping. )  . LORazepam (ATIVAN) 1 MG tablet Take 0.5-1 tablets (0.5-1 mg total) by mouth every 8 (eight) hours as needed for anxiety.  . tamsulosin (FLOMAX) 0.4 MG CAPS capsule Take 0.4 mg by mouth in the morning and at bedtime.   . bisacodyl (DULCOLAX) 5 MG EC tablet Take 5 mg by mouth daily as needed for mild constipation or moderate constipation.  (Patient not taking: Reported on 11/14/2019)  . lidocaine (XYLOCAINE) 5 % ointment Apply 1 application topically as needed. (Patient not taking: Reported on 11/14/2019)  . rivaroxaban (XARELTO) 20 MG TABS tablet Take 1 tablet (20 mg total) by mouth daily with supper. (Patient not taking: Reported on 11/14/2019)   No facility-administered encounter medications on file as of 11/14/2019.    Allergies (verified) Effexor xr [venlafaxine hcl er], Acyclovir and related, Zocor [simvastatin], Dexilant [dexlansoprazole], Ivp dye [iodinated diagnostic agents], Soma [carisoprodol], and Sulfa antibiotics   History: Past Medical History:  Diagnosis Date  . Anxiety   . Arthritis    "lower back; hips;' right knee"   . Bipolar disorder ([place])   . Chronic constipation   . Depression   . Diverticulosis of colon   . Edema of both lower extremities    ANKLES  . Fatty liver   . GERD (gastroesophageal reflux disease)   . Hiatal hernia   . History of anal fissures   . History of deep vein thrombosis (DVT) of lower extremity 02/11/2016   right  lower extremity distal and proximal extensive acute  . History of Helicobacter pylori infection 2012; 2009  . History of kidney stones   . History of pneumonia 01/2015   CAP  . 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 Hyperlipidemia   . IBS (irritable bowel syndrome)   . Left ureteral stone   . Nocturia   .  Peripheral neuropathy    per pt due to back DDD  . Personal history of colonic polyps    HYPERPLASTIC POLYP  . Pulmonary nodule, right    incidental finding CT chest angio 02-11-2016 stable  . TMJ (temporomandibular joint syndrome)   . Varicosities of leg    Past Surgical History:  Procedure Laterality Date  . BELPHAROPTOSIS REPAIR Bilateral 01/2015  . CATARACT EXTRACTION W/ INTRAOCULAR LENS  IMPLANT, BILATERAL  04/2016  . COLONOSCOPY WITH ESOPHAGOGASTRODUODENOSCOPY (EGD)  last one 03-02-2013  . CYSTOSCOPY/RETROGRADE/URETEROSCOPY/STONE EXTRACTION WITH BASKET  2010  . CYSTOSCOPY/URETEROSCOPY/HOLMIUM LASER/STENT PLACEMENT Left 03/16/2017   Procedure: CYSTOSCOPY/RETROGRADE/URETEROSCOPY/HOLMIUM LASER/STENT PLACEMENT, STONE BASKET EXTRACTION AND STENT PLACEMENT;  Surgeon: [name], MD;  Location: [place];  Service: Urology;  Laterality: Left;  . EXCISIONAL HEMORRHOIDECTOMY  2015 approx.  Marland Kitchen EXTRACORPOREAL SHOCK WAVE LITHOTRIPSY  1987; 1992  . FOOT NEUROMA SURGERY Bilateral left 09/2015; right 04/ 2017   morton' neuroma  . FOOT NEUROMA SURGERY Right 2004   morton's neuroma and removal heel spur  . HAMMER TOE SURGERY Left 2013   5th toe  . INCISION AND DRAINAGE FOOT Left 1992   "ball of foot; stepped on piece of hard wire & it went thru my shoe"  . KNEE ARTHROSCOPY Bilateral left x2 1997;  right 2004 & 2005  . NASAL FRACTURE SURGERY  1997  . PATELLA-FEMORAL ARTHROPLASTY Right 07-06-2003   dr Eddie Dibbles [facility]  . PLANTAR FASCIA RELEASE Bilateral 1987 & 1993  . RHINOPLASTY  2000  . SHOULDER ARTHROSCOPY Left 2012;  2013  . SHOULDER ARTHROSCOPY WITH DISTAL CLAVICLE RESECTION Right 07-31-2005   dr Percell Miller  [facility]   w/ Debridement labral and adhesions, acromioplasty, bursectomy, and CA ligament release  . TRANSTHORACIC ECHOCARDIOGRAM  02/12/2016   ef 77-82%, grade 1 diastolic dysfunction/  trivial TR   Family History  Problem Relation Age of Onset  . Heart disease Mother   .  Stroke Mother   . Cirrhosis Father   . Heart disease Father   . Skin cancer Father   . Prostate cancer Maternal Grandfather   . Parkinson's disease Paternal Grandfather   . Heart disease Brother   . Colon cancer Neg Hx   . Stomach cancer Neg Hx    Social History   Socioeconomic History  . Marital status: Divorced    Spouse name: Not on file  . Number of children: 0  . Years of education: Not on file  . Highest education level: Not on file  Occupational History  . Occupation: retired    Fish farm manager: [employer]    Comment: disabled now  Tobacco Use  . Smoking status: Never Smoker  . Smokeless tobacco: Never Used  Vaping Use  . Vaping Use: Never used  Substance and Sexual Activity  . Alcohol use: No    Alcohol/week: 0.0 standard drinks  . Drug use: No  . Sexual activity: Never  Other Topics Concern  . Not on file  Social History Narrative   Goes by "[name]"   Divorced   No children   Retired from the post  office   Has internet girlfriend on facebook from [place]   Social Determinants of Health   Financial Resource Strain: Low Risk   . Difficulty of Paying Living Expenses: Not hard at all  Food Insecurity: No Food Insecurity  . Worried About Charity fundraiser in the Last Year: Never true  . Ran Out of Food in the Last Year: Never true  Transportation Needs: No Transportation Needs  . Lack of Transportation (Medical): No  . Lack of Transportation (Non-Medical): No  Physical Activity: Sufficiently Active  . Days of Exercise per Week: 5 days  . Minutes of Exercise per Session: 40 min  Stress: Stress Concern Present  . Feeling of Stress : Rather much  Social Connections: Socially Isolated  . Frequency of Communication with Friends and Family: Never  . Frequency of Social Gatherings with Friends and Family: Never  . Attends Religious Services: Never  . Active Member of Clubs or Organizations: No  . Attends Archivist Meetings: Never  . Marital Status:  Divorced    Tobacco Counseling Counseling given: Not Answered   Clinical Intake:  Pre-visit preparation completed: Yes  Pain : 0-10 (sore after passing kidney stone this weekend) Pain Location: Other (Comment) (after passing kidney stone) Pain Orientation: Left Pain Descriptors / Indicators: Aching Pain Onset: 1 to 4 weeks ago Pain Frequency: Intermittent     BMI - recorded: 22.81 Nutritional Status: BMI of 19-24  Normal Diabetes: No  How often do you need to have someone help you when you read instructions, pamphlets, or other written materials from your doctor or pharmacy?: 1 - Never  Diabetic?No  Interpreter Needed?: No  Information entered by :: Charlott Rakes, LPN   Activities of Daily Living In your present state of health, do you have any difficulty performing the following activities: 11/14/2019  Hearing? N  Vision? N  Difficulty concentrating or making decisions? Y  Comment at times  Walking or climbing stairs? Y  Comment Knne gives out sometimes  Dressing or bathing? N  Doing errands, shopping? N  Preparing Food and eating ? N  Using the Toilet? N  In the past six months, have you accidently leaked urine? Y  Comment during kidney stone issues  Do you have problems with loss of bowel control? N  Managing your Medications? N  Managing your Finances? N  Housekeeping or managing your Housekeeping? N  Some recent data might be hidden    Patient Care Team: Vivi Barrack, MD as PCP - General (Family Medicine) Michael Boston, MD as Consulting Physician (General Surgery) Sable Feil, MD as Consulting Physician (Gastroenterology) Willia Craze, NP as Nurse Practitioner (Gastroenterology) [name], MD as Consulting Physician (Urology) [name], MD as Consulting Physician (Psychiatry) Alyson Ingles Candee Furbish, MD as Consulting Physician (Urology)  Indicate any recent Medical Services you may have received from other than [facility] providers in  the past year (date may be approximate).     Assessment:   This is a routine wellness examination for Hajime.  Hearing/Vision screen  Hearing Screening   125Hz  250Hz  500Hz  1000Hz  2000Hz  3000Hz  4000Hz  6000Hz  8000Hz   Right ear:           Left ear:           Comments: Pt denies hearing difficulty at this time  Vision Screening Comments: Pt follows up with annual eye exams with Dr. Chilton Greathouse [facility]  Dietary issues and exercise activities discussed: Current Exercise Habits: The patient does not  participate in regular exercise at present, Exercise limited by: orthopedic condition(s)  Goals    .  Avoid kidney stones. Maintain current health status. (pt-stated)    .  Patient Stated      Stay healthy       Depression Screen PHQ 2/9 Scores 11/14/2019 08/04/2018 05/26/2017 05/05/2017 11/11/2016  PHQ - 2 Score 6 4 4 2 1   PHQ- 9 Score 15 10 12 5  -    Fall Risk Fall Risk  11/14/2019 12/30/2018 08/04/2018 05/05/2017 11/11/2016  Falls in the past year? 1 0 0 No No  Number falls in past yr: 1 - - - -  Injury with Fall? 0 - - - -  Comment fell in pine needles by a miss steps - - - -    Any stairs in or around the home? Yes  If so, are there any without handrails? No  Home free of loose throw rugs in walkways, pet beds, electrical cords, etc? Yes  Adequate lighting in your home to reduce risk of falls? Yes   ASSISTIVE DEVICES UTILIZED TO PREVENT FALLS:  Life alert? No  Use of a cane, walker or w/c? No  Grab bars in the bathroom? No  Shower chair or bench in shower? Yes  Elevated toilet seat or a handicapped toilet? No   TIMED UP AND GO:  Was the test performed? No .  Cognitive Function:     6CIT Screen 11/14/2019  What time? 0 points  Count back from 20 0 points  Months in reverse 0 points  Repeat phrase 4 points    Immunizations Immunization History  Administered Date(s) Administered  . Influenza Split 12/22/2012  . Influenza, High Dose Seasonal PF 01/19/2018, 12/02/2018   . Influenza,inj,Quad PF,6+ Mos 01/07/2014  . Influenza-Unspecified 02/12/2015, 01/13/2016  . Moderna SARS-COVID-2 Vaccination 06/15/2019, 07/13/2019  . Pneumococcal Conjugate-13 08/05/2016  . Pneumococcal Polysaccharide-23 01/19/2018  . Tdap 11/29/2012  . Zoster 11/29/2012  . Zoster Recombinat (Shingrix) 07/13/2016, 09/12/2016    TDAP status: Up to date Flu Vaccine status: Up to date Pneumococcal vaccine status: Up to date Covid-19 vaccine status: Completed vaccines  Qualifies for Shingles Vaccine? Yes   Zostavax completed Yes   Shingrix Completed?: Yes  Screening Tests Health Maintenance  Topic Date Due  . INFLUENZA VACCINE  11/13/2019  . TETANUS/TDAP  11/30/2022  . COLONOSCOPY  03/03/2023  . COVID-19 Vaccine  Completed  . Hepatitis C Screening  Completed  . PNA vac Low Risk Adult  Completed    Health Maintenance  Health Maintenance Due  Topic Date Due  . INFLUENZA VACCINE  11/13/2019    Colorectal cancer screening: Completed 03/02/2013. Repeat every 10 years  Additional Screening:  Hepatitis C Screening: Completed 11/24/16  Vision Screening: Recommended annual ophthalmology exams for early detection of glaucoma and other disorders of the eye. Is the patient up to date with their annual eye exam?  Yes  Who is the provider or what is the name of the office in which the patient attends annual eye exams? [facility]   Dental Screening: Recommended annual dental exams for proper oral hygiene  Community Resource Referral / Chronic Care Management: CRR required this visit?  No   CCM required this visit?  No      Plan:     I have personally reviewed and noted the following in the patient's chart:   . Medical and social history . Use of alcohol, tobacco or illicit drugs  . Current medications and  supplements . Functional ability and status . Nutritional status . Physical activity . Advanced directives . List of other  physicians . Hospitalizations, surgeries, and ER visits in previous 12 months . Vitals . Screenings to include cognitive, depression, and falls . Referrals and appointments  In addition, I have reviewed and discussed with patient certain preventive protocols, quality metrics, and best practice recommendations. A written personalized care plan for preventive services as well as general preventive health recommendations were provided to patient.     David Brace, LPN   08/20/7274   Nurse Notes: Pt stated he passed a kidney stone this pass weekend and still is experiencing soreness. Will call for follow up if needed. Has appt with urology upcoming per pt.

## 2020-01-10 DIAGNOSIS — M9903 Segmental and somatic dysfunction of lumbar region: Secondary | ICD-10-CM | POA: Diagnosis not present

## 2020-01-10 DIAGNOSIS — M9905 Segmental and somatic dysfunction of pelvic region: Secondary | ICD-10-CM | POA: Diagnosis not present

## 2020-01-10 DIAGNOSIS — M9904 Segmental and somatic dysfunction of sacral region: Secondary | ICD-10-CM | POA: Diagnosis not present

## 2020-01-10 DIAGNOSIS — M5137 Other intervertebral disc degeneration, lumbosacral region: Secondary | ICD-10-CM | POA: Diagnosis not present

## 2020-01-13 ENCOUNTER — Other Ambulatory Visit: Payer: Self-pay

## 2020-01-13 ENCOUNTER — Ambulatory Visit (INDEPENDENT_AMBULATORY_CARE_PROVIDER_SITE_OTHER): Payer: Medicare Other | Admitting: Family Medicine

## 2020-01-13 VITALS — BP 107/74 | HR 71 | Temp 98.1°F | Ht 72.0 in | Wt 168.0 lb

## 2020-01-13 DIAGNOSIS — R600 Localized edema: Secondary | ICD-10-CM | POA: Diagnosis not present

## 2020-01-13 DIAGNOSIS — M542 Cervicalgia: Secondary | ICD-10-CM | POA: Diagnosis not present

## 2020-01-13 DIAGNOSIS — Z86711 Personal history of pulmonary embolism: Secondary | ICD-10-CM

## 2020-01-13 DIAGNOSIS — L821 Other seborrheic keratosis: Secondary | ICD-10-CM

## 2020-01-13 MED ORDER — DICLOFENAC SODIUM 75 MG PO TBEC: 75.0000 mg | DELAYED_RELEASE_TABLET | Freq: Two times a day (BID) | ORAL | 0 refills | Status: DC

## 2020-01-13 NOTE — Assessment & Plan Note (Signed)
No red flags.  Likely secondary to venous insufficiency.  Continue conservative measures including leg elevation, compression, and salt avoidance.

## 2020-01-13 NOTE — Assessment & Plan Note (Signed)
No DVT on scan 3 months ago.  He is no longer on anticoagulation.

## 2020-01-13 NOTE — Patient Instructions (Signed)
It was very nice to see you today!  I think you have a muscular strain. Please take the anti-inflammatory for a couple of weeks and work on the exercises. Also try a heating pad.  Let me know if not improving in a couple of weeks.   Take care, Dr [name]  Please try these tips to maintain a healthy lifestyle:   Eat at least 3 REAL meals and 1-2 snacks per day.  Aim for no more than 5 hours between eating.  If you eat breakfast, please do so within one hour of getting up.    Each meal should contain half fruits/vegetables, one quarter protein, and one quarter carbs (no bigger than a computer mouse)   Cut down on sweet beverages. This includes juice, soda, and sweet tea.     Drink at least 1 glass of water with each meal and aim for at least 8 glasses per day   Exercise at least 150 minutes every week.

## 2020-01-13 NOTE — Progress Notes (Signed)
   David Armstrong is a 69 y.o. male who presents today for an office visit.  Assessment/Plan:  New/Acute Problems: Neck Pain No red flags.  Tenderness to palpation on right SCM with no LAD.  Will treat with diclofenac and home exercises.  Discussed reasons to return to care.  Likely has some underlying osteoarthritis which is playing a role as well.  If no improvement would consider referral to sports med.  Chronic Problems Addressed Today: No problem-specific Assessment & Plan notes found for this encounter.  Preventative Healthcare Discussed covid.     Subjective:  HPI:  Patient here with right sided neck pain for a few weeks.  Thinks that he may have strained it by his sleep position or looking at his phone too much. Worse with certain motions. No fevers or chills. No weakness or numbness.   He has also had some longstanding issues with bilateral leg swelling.  Worse on the right.  Has been trying elevation and compression with modest improvement.       Objective:  Physical Exam: BP 107/74   Pulse 71   Temp 98.1 F (36.7 C) (Temporal)   Ht 6' (1.829 m)   Wt 168 lb (76.2 kg)   SpO2 99%   BMI 22.78 kg/m   Gen: No acute distress, resting comfortably HEENT: OP clear. Right anterior SCM tender to palpation. No LAD.  CV: Regular rate and rhythm with no murmurs appreciated Pulm: Normal work of breathing, clear to auscultation bilaterally with no crackles, wheezes, or rhonchi MSK: UE with full strength and range of motion.  Neuro: Grossly normal, moves all extremities Psych: Normal affect and thought content  Time Spent: 50 minutes of total time was spent on the date of the encounter performing the following actions: chart review prior to seeing the patient, obtaining history, performing a medically necessary exam, counseling on the treatment plan, placing orders, and documenting in our EHR.        [name]. [name], MD 01/13/2020 10:55 AM

## 2020-01-16 DIAGNOSIS — M9905 Segmental and somatic dysfunction of pelvic region: Secondary | ICD-10-CM | POA: Diagnosis not present

## 2020-01-16 DIAGNOSIS — M5137 Other intervertebral disc degeneration, lumbosacral region: Secondary | ICD-10-CM | POA: Diagnosis not present

## 2020-01-16 DIAGNOSIS — M9903 Segmental and somatic dysfunction of lumbar region: Secondary | ICD-10-CM | POA: Diagnosis not present

## 2020-01-16 DIAGNOSIS — M9904 Segmental and somatic dysfunction of sacral region: Secondary | ICD-10-CM | POA: Diagnosis not present

## 2020-01-18 DIAGNOSIS — M5137 Other intervertebral disc degeneration, lumbosacral region: Secondary | ICD-10-CM | POA: Diagnosis not present

## 2020-01-18 DIAGNOSIS — M9904 Segmental and somatic dysfunction of sacral region: Secondary | ICD-10-CM | POA: Diagnosis not present

## 2020-01-18 DIAGNOSIS — M9903 Segmental and somatic dysfunction of lumbar region: Secondary | ICD-10-CM | POA: Diagnosis not present

## 2020-01-18 DIAGNOSIS — M9905 Segmental and somatic dysfunction of pelvic region: Secondary | ICD-10-CM | POA: Diagnosis not present

## 2020-01-19 DIAGNOSIS — H02889 Meibomian gland dysfunction of unspecified eye, unspecified eyelid: Secondary | ICD-10-CM | POA: Diagnosis not present

## 2020-01-19 DIAGNOSIS — R270 Ataxia, unspecified: Secondary | ICD-10-CM | POA: Diagnosis not present

## 2020-01-19 DIAGNOSIS — G5603 Carpal tunnel syndrome, bilateral upper limbs: Secondary | ICD-10-CM | POA: Diagnosis not present

## 2020-01-19 DIAGNOSIS — M545 Low back pain, unspecified: Secondary | ICD-10-CM | POA: Diagnosis not present

## 2020-01-19 DIAGNOSIS — H01019 Ulcerative blepharitis unspecified eye, unspecified eyelid: Secondary | ICD-10-CM | POA: Diagnosis not present

## 2020-01-19 DIAGNOSIS — R519 Headache, unspecified: Secondary | ICD-10-CM | POA: Diagnosis not present

## 2020-01-19 DIAGNOSIS — M542 Cervicalgia: Secondary | ICD-10-CM | POA: Diagnosis not present

## 2020-01-24 DIAGNOSIS — M5137 Other intervertebral disc degeneration, lumbosacral region: Secondary | ICD-10-CM | POA: Diagnosis not present

## 2020-01-24 DIAGNOSIS — M9903 Segmental and somatic dysfunction of lumbar region: Secondary | ICD-10-CM | POA: Diagnosis not present

## 2020-01-24 DIAGNOSIS — M9905 Segmental and somatic dysfunction of pelvic region: Secondary | ICD-10-CM | POA: Diagnosis not present

## 2020-01-24 DIAGNOSIS — M9904 Segmental and somatic dysfunction of sacral region: Secondary | ICD-10-CM | POA: Diagnosis not present

## 2020-01-26 DIAGNOSIS — M9904 Segmental and somatic dysfunction of sacral region: Secondary | ICD-10-CM | POA: Diagnosis not present

## 2020-01-26 DIAGNOSIS — M9905 Segmental and somatic dysfunction of pelvic region: Secondary | ICD-10-CM | POA: Diagnosis not present

## 2020-01-26 DIAGNOSIS — M5137 Other intervertebral disc degeneration, lumbosacral region: Secondary | ICD-10-CM | POA: Diagnosis not present

## 2020-01-26 DIAGNOSIS — M9903 Segmental and somatic dysfunction of lumbar region: Secondary | ICD-10-CM | POA: Diagnosis not present

## 2020-02-02 DIAGNOSIS — H02889 Meibomian gland dysfunction of unspecified eye, unspecified eyelid: Secondary | ICD-10-CM | POA: Diagnosis not present

## 2020-02-02 DIAGNOSIS — H01019 Ulcerative blepharitis unspecified eye, unspecified eyelid: Secondary | ICD-10-CM | POA: Diagnosis not present

## 2020-02-08 ENCOUNTER — Telehealth: Payer: Self-pay

## 2020-02-08 NOTE — Telephone Encounter (Signed)
Patient refused UC/ED, and declined to see anyone outside of office. Requested an appointment for Friday.    Nurse Assessment Nurse: Chestine Spore, RN, [place] Date/Time (Eastern Time): 02/08/2020 10:27:32 AM Confirm and document reason for call. If symptomatic, describe symptoms. ---caller states having pain in calf. pain 7 (0-10 pain scale). swollen area. HX blood clot Does the patient have any new or worsening symptoms? ---Yes Will a triage be completed? ---Yes Related visit to physician within the last 2 weeks? ---No Does the PT have any chronic conditions? (i.e. diabetes, asthma, this includes High risk factors for pregnancy, etc.) ---Yes List chronic conditions. ---HX blood clot Is this a behavioral health or substance abuse call? ---No Guidelines Guideline Title Affirmed Question Affirmed Notes Nurse Date/Time Eilene Ghazi Time) Leg Swelling and Edema [1] Thigh or calf pain AND [2] only 1 side AND [3] present > 1 hour Matherly, RN, [place] 02/08/2020 10:29:34 AM Disp. Time Eilene Ghazi Time) Disposition Final User 02/08/2020 10:31:34 AM See HCP within 4 Hours (or PCP triage) Yes Chestine Spore, RN, Silvestre Moment Disagree/Comply Comply PLEASE NOTE: All timestamps contained within this report are represented as [place] Standard Time. CONFIDENTIALTY NOTICE: This fax transmission is intended only for the addressee. It contains information that is legally privileged, confidential or otherwise protected from use or disclosure. If you are not the intended recipient, you are strictly prohibited from reviewing, disclosing, copying using or disseminating any of this information or taking any action in reliance on or regarding this information. If you have received this fax in error, please notify us immediately by telephone so that we can arrange for its return to [place]. Phone: [phone], Toll-Free: [phone], Fax: [phone] Page: 2 of 2 Call Id: [id number] [place] Understands  Yes PreDisposition Call Doctor Care Advice Given Per Guideline SEE HCP (OR PCP TRIAGE) WITHIN 4 HOURS: CALL EMS IF: * Chest pain or shortness of breath occurs. CARE ADVICE given per Leg Swelling and Edema (Adult) guideline. Comments User: Cheri Kearns, RN Date/Time Eilene Ghazi Time): 02/08/2020 10:34:02 AM warm transferred to backline per directive Referrals Warm transfer to backlin

## 2020-02-09 NOTE — Telephone Encounter (Signed)
Pt has ov 02/10/20.

## 2020-02-10 ENCOUNTER — Ambulatory Visit (INDEPENDENT_AMBULATORY_CARE_PROVIDER_SITE_OTHER): Payer: Medicare Other | Admitting: Physician Assistant

## 2020-02-10 ENCOUNTER — Ambulatory Visit (HOSPITAL_COMMUNITY)
Admission: RE | Admit: 2020-02-10 | Discharge: 2020-02-10 | Disposition: A | Discharge status: Home / Self Care | Payer: Medicare Other | Source: Ambulatory Visit | Attending: Physician Assistant | Admitting: Physician Assistant

## 2020-02-10 ENCOUNTER — Encounter: Payer: Self-pay | Admitting: Physician Assistant

## 2020-02-10 ENCOUNTER — Other Ambulatory Visit: Payer: Self-pay

## 2020-02-10 ENCOUNTER — Other Ambulatory Visit: Payer: Self-pay | Admitting: Physician Assistant

## 2020-02-10 ENCOUNTER — Telehealth: Payer: Self-pay

## 2020-02-10 ENCOUNTER — Other Ambulatory Visit (HOSPITAL_COMMUNITY): Payer: Self-pay | Admitting: Physician Assistant

## 2020-02-10 VITALS — BP 110/74 | HR 78 | Temp 98.5°F | Ht 72.0 in | Wt 166.4 lb

## 2020-02-10 DIAGNOSIS — R600 Localized edema: Secondary | ICD-10-CM

## 2020-02-10 DIAGNOSIS — M79604 Pain in right leg: Secondary | ICD-10-CM | POA: Insufficient documentation

## 2020-02-10 MED ORDER — KETOROLAC TROMETHAMINE 60 MG/2ML IM SOLN
60.0000 mg | Freq: Once | INTRAMUSCULAR | Status: AC
  Administered 2020-02-10: 60 mg via INTRAMUSCULAR

## 2020-02-10 MED ORDER — RIVAROXABAN 20 MG PO TABS: 20.0000 mg | ORAL_TABLET | Freq: Every day | ORAL | 1 refills | Status: DC

## 2020-02-10 MED ORDER — METHYLPREDNISOLONE ACETATE 80 MG/ML IJ SUSP
80.0000 mg | Freq: Once | INTRAMUSCULAR | Status: AC
  Administered 2020-02-10: 80 mg via INTRAMUSCULAR

## 2020-02-10 MED ORDER — RIVAROXABAN 15 MG PO TABS: 15.0000 mg | ORAL_TABLET | Freq: Two times a day (BID) | ORAL | 0 refills | Status: DC

## 2020-02-10 NOTE — Telephone Encounter (Signed)
Pt has a 50% blockage in his leg and was put on blood thinner. He wants to know if he should still wear his compression stockings, since he was put on blood thinner.

## 2020-02-10 NOTE — Telephone Encounter (Signed)
See below

## 2020-02-10 NOTE — Patient Instructions (Addendum)
It was great to see you!  Toradol and prednisone injections today.  Resume your low-dose aspirin.  Avoid high salt foods.  Please go purchase compression stockings (see separate form)  When you go [facility] today, please schedule a follow-up with Dr. Donzetta Matters. If for some reason, you need a referral, please let me know.  If you develop any chest pain or shortness of breath, please to go to the ER.  Take care,  Inda Coke PA-C

## 2020-02-10 NOTE — Progress Notes (Signed)
David Armstrong is a 69 y.o. male here for a new problem.  I acted as a Education administrator for [employer], PA-C [employer], LPN   History of Present Illness:   Chief Complaint  Patient presents with   Leg Pain    HPI   Leg pain Pt c/o right calf pain x 3 months, has generalized achiness. Has been taking Ibuprofen with some relief. Hurts some when walks and has cramps at night. Has had significant issues in the past with recurrent swelling of RLE. Hx of DVT and this has him concerned whenever he has worsening pain/swelling in his leg.  Was walking up to 2-3 miles a day and did not have any pain. Denies: SOB, chest pain, lightheadedness, dizziness.  Last visit with Dr. Servando Snare was 09/26/16 -- he was recommended to remain on baby ASA for the rest of his life. Patient is currently not taking this and is unable to tell me why. He is not on any blood thinners.  He was also seen by our sports medicine provider earlier this year and was recommended to wear compression stockings and reduce salt. He states that he never did purchase compression stockings because he didn't know where to go or how to get fitted. In regards to salt intake he tells me "I ate a bag of potato chips yesterday." Unable to provide any more information about his diet.  Past Medical History:  Diagnosis Date   Anxiety    Arthritis    "lower back; hips;' right knee"    Bipolar disorder ([place])    Chronic constipation    Depression    Diverticulosis of colon    Edema of both lower extremities    ANKLES   Fatty liver    GERD (gastroesophageal reflux disease)    Hiatal hernia    History of anal fissures    History of deep vein thrombosis (DVT) of lower extremity 02/11/2016   right lower extremity distal and proximal extensive acute   History of Helicobacter pylori infection 2012; 2009   History of kidney stones    History of pneumonia 01/2015   CAP   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/11/2016    multiple bilateral    Hyperlipidemia    IBS (irritable bowel syndrome)    Left ureteral stone    Nocturia    Peripheral neuropathy    per pt due to back DDD   Personal history of colonic polyps    HYPERPLASTIC POLYP   Pulmonary nodule, right    incidental finding CT chest angio 02-11-2016 stable   TMJ (temporomandibular joint syndrome)    Varicosities of leg      Social History   Tobacco Use   Smoking status: Never Smoker   Smokeless tobacco: Never Used  Vaping Use   Vaping Use: Never used  Substance Use Topics   Alcohol use: No    Alcohol/week: 0.0 standard drinks   Drug use: No    Past Surgical History:  Procedure Laterality Date   BELPHAROPTOSIS REPAIR Bilateral 01/2015   CATARACT EXTRACTION W/ INTRAOCULAR LENS  IMPLANT, BILATERAL  04/2016   COLONOSCOPY WITH ESOPHAGOGASTRODUODENOSCOPY (EGD)  last one 03-02-2013   CYSTOSCOPY/RETROGRADE/URETEROSCOPY/STONE EXTRACTION WITH BASKET  2010   CYSTOSCOPY/URETEROSCOPY/HOLMIUM LASER/STENT PLACEMENT Left 03/16/2017   Procedure: CYSTOSCOPY/RETROGRADE/URETEROSCOPY/HOLMIUM LASER/STENT PLACEMENT, STONE BASKET EXTRACTION AND STENT PLACEMENT;  Surgeon: [name], MD;  Location: [place];  Service: Urology;  Laterality: Left;   EXCISIONAL HEMORRHOIDECTOMY  2015 approx.   EXTRACORPOREAL SHOCK  WAVE LITHOTRIPSY  1987; [place] Bilateral left 09/2015; right 04/ 2017   morton' neuroma   FOOT NEUROMA SURGERY Right 2004   morton's neuroma and removal heel spur   HAMMER TOE SURGERY Left 2013   5th toe   INCISION AND DRAINAGE FOOT Left 1992   "ball of foot; stepped on piece of hard wire & it went thru my shoe"   KNEE ARTHROSCOPY Bilateral left x2 1997;  right 2004 & 2005   NASAL FRACTURE SURGERY  1997   PATELLA-FEMORAL ARTHROPLASTY Right 07-06-2003   dr [facility]   PLANTAR FASCIA RELEASE Bilateral 1987 & 1993   RHINOPLASTY  2000   SHOULDER ARTHROSCOPY Left 2012;  2013    SHOULDER ARTHROSCOPY WITH DISTAL CLAVICLE RESECTION Right 07-31-2005   dr Percell Miller  [facility]   w/ Debridement labral and adhesions, acromioplasty, bursectomy, and CA ligament release   TRANSTHORACIC ECHOCARDIOGRAM  02/12/2016   ef 50-53%, grade 1 diastolic dysfunction/  trivial TR    Family History  Problem Relation Age of Onset   Heart disease Mother    Stroke Mother    Cirrhosis Father    Heart disease Father    Skin cancer Father    Prostate cancer Maternal Grandfather    Parkinson's disease Paternal Grandfather    Heart disease Brother    Colon cancer Neg Hx    Stomach cancer Neg Hx     Allergies  Allergen Reactions   Effexor Xr [Venlafaxine Hcl Er] Other (See Comments)    Insomnia   Acyclovir And Related    Zocor [Simvastatin] Other (See Comments)    Muscle ache/ pain   Dexilant [Dexlansoprazole] Other (See Comments)    Muscle aches   Ivp Dye [Iodinated Diagnostic Agents] Rash   Soma [Carisoprodol] Other (See Comments)    Sores on arm   Sulfa Antibiotics Rash    Current Medications:   Current Outpatient Medications:    desonide (DESOWEN) 0.05 % lotion, APPLY EXTERNALLY TO THE AFFECTED AREA EVERY DAY AS NEEDED (Patient taking differently: Apply 1 application topically daily as needed (skin irritation). ), Disp: 118 mL, Rfl: 2   diclofenac (VOLTAREN) 75 MG EC tablet, Take 1 tablet (75 mg total) by mouth 2 (two) times daily., Disp: 30 tablet, Rfl: 0   hyoscyamine (LEVSIN SL) 0.125 MG SL tablet, DISSOLVE 1 TABLET(0.125 MG) UNDER THE TONGUE EVERY 6 HOURS AS NEEDED (Patient taking differently: Take 0.125 mg by mouth every 6 (six) hours as needed for cramping. ), Disp: 120 tablet, Rfl: 3   LORazepam (ATIVAN) 1 MG tablet, Take 0.5-1 tablets (0.5-1 mg total) by mouth every 8 (eight) hours as needed for anxiety., Disp: 90 tablet, Rfl: 0   Review of Systems:   ROS Negative unless otherwise specified per HPI.  Vitals:   Vitals:   02/10/20 1007  BP: 110/74   Pulse: 78  Temp: 98.5 F (36.9 C)  TempSrc: Temporal  SpO2: 100%  Weight: 166 lb 6.1 oz (75.5 kg)  Height: 6' (1.829 m)     Body mass index is 22.57 kg/m.  Physical Exam:   Physical Exam Vitals and nursing note reviewed.  Constitutional:      General: He is not in acute distress.    Appearance: He is well-developed. He is not ill-appearing or toxic-appearing.  Cardiovascular:     Rate and Rhythm: Normal rate and regular rhythm.     Pulses: Normal pulses.          Dorsalis pedis  pulses are 2+ on the right side and 2+ on the left side.     Heart sounds: Normal heart sounds, S1 normal and S2 normal.  Pulmonary:     Effort: Pulmonary effort is normal.     Breath sounds: Normal breath sounds.  Musculoskeletal:     Comments: RLE with trace swelling Generalized pain to calf   Skin:    General: Skin is warm and dry.  Neurological:     Mental Status: He is alert.     GCS: GCS eye subscore is 4. GCS verbal subscore is 5. GCS motor subscore is 6.  Psychiatric:        Speech: Speech normal.        Behavior: Behavior normal. Behavior is cooperative.     Assessment and Plan:   David Armstrong was seen today for leg pain.  Diagnoses and all orders for this visit:  Leg edema   Referral for doppler U/S -- appointment scheduled for this afternoon to r/o DVT. Patient was sized for compression stockings and was instructed on where to go to get this done. Handouts provided. Toradol and prednisone injection given today, tolerated well. Recommended reduction in salt. He has significant questions about DVT recurrence and his vascular system, recommended follow-up with Dr. Donzetta Matters. Resume low dose ASA. Advised that if he develops chest pain or SOB, he is to go to the ER.  CMA or LPN served as scribe during this visit. History, Physical, and Plan performed by medical provider. The above documentation has been reviewed and is accurate and complete.   Inda Coke, PA-C

## 2020-02-13 ENCOUNTER — Telehealth: Payer: Self-pay | Admitting: *Deleted

## 2020-02-13 NOTE — Telephone Encounter (Signed)
Left message to return call to our office at their convenience.  

## 2020-02-13 NOTE — Telephone Encounter (Signed)
Requesting US of the abdomin  Stated has pain on the abdomin, lower right back and groin area.

## 2020-02-13 NOTE — Telephone Encounter (Signed)
Pt aware.

## 2020-02-13 NOTE — Telephone Encounter (Signed)
I believe this has already been addressed but he needs to stay on his blood thinner indefinitely.   [name]. [name], MD 02/13/2020 8:11 AM

## 2020-02-13 NOTE — Telephone Encounter (Signed)
Recommend triage and office visit if he is able. Ultrasound is typically used for focused exam. Need to take a look at him to make sure we are ordering the right test.  [name]. [name], MD 02/13/2020 10:37 AM

## 2020-02-14 NOTE — Telephone Encounter (Signed)
Patient aware, will call to schedule appointment

## 2020-02-16 ENCOUNTER — Telehealth (INDEPENDENT_AMBULATORY_CARE_PROVIDER_SITE_OTHER): Payer: Medicare Other | Admitting: Family Medicine

## 2020-02-16 VITALS — Ht 72.0 in | Wt 165.0 lb

## 2020-02-16 DIAGNOSIS — J309 Allergic rhinitis, unspecified: Secondary | ICD-10-CM | POA: Diagnosis not present

## 2020-02-16 DIAGNOSIS — I82409 Acute embolism and thrombosis of unspecified deep veins of unspecified lower extremity: Secondary | ICD-10-CM | POA: Insufficient documentation

## 2020-02-16 DIAGNOSIS — I824Z1 Acute embolism and thrombosis of unspecified deep veins of right distal lower extremity: Secondary | ICD-10-CM

## 2020-02-16 MED ORDER — AZELASTINE HCL 0.1 % NA SOLN: 2.0000 | Freq: Two times a day (BID) | NASAL | 12 refills | Status: DC

## 2020-02-16 MED ORDER — BENZONATATE 200 MG PO CAPS: 200.0000 mg | ORAL_CAPSULE | Freq: Two times a day (BID) | ORAL | 0 refills | Status: DC | PRN

## 2020-02-16 NOTE — Assessment & Plan Note (Addendum)
Extensively discussed recent diagnosis with patient today. He is tolerating xarelto well without side effects. Discussed need for lifelong anticoagulation at this point though he seems to be fairly resistant to this. He would like to stop after a year of use. Discussed risks and benefits of lifelong use and strongly recommended lifelong use.

## 2020-02-16 NOTE — Assessment & Plan Note (Addendum)
Likely main contributor to patients cough. Will start astelin. He can continue OTC meds.

## 2020-02-16 NOTE — Progress Notes (Signed)
   David Armstrong is a 69 y.o. male who presents today for a virtual office visit.  Assessment/Plan:  New/Acute Problems: Cough / Rhinorrhea Likely allergic rhinitis. No red flags.  Will start Astelin.  Also start Tessalon.  No shortness of breath, dyspnea on exertion, or chest pain-doubt PE.  Discussed reasons return to care and seek emergent care.  Chronic Problems Addressed Today: DVT (deep venous thrombosis) (HCC) Extensively discussed recent diagnosis with patient today. He is tolerating xarelto well without side effects. Discussed need for lifelong anticoagulation at this point though he seems to be fairly resistant to this. He would like to stop after a year of use. Discussed risks and benefits of lifelong use and strongly recommended lifelong use.   Allergic rhinitis Likely main contributor to patients cough. Will start astelin. He can continue OTC meds.     Subjective:  HPI:  Patient with rhinorrhea or persistent cough for the last week or so. Symptoms are overall stable. No fevers or chills. Worsening rhinorrhea. No chest pain or shortness of breath. Cough occasionally productive of clear sputum. No hemoptysis.        Objective/Observations  Physical Exam: Gen: NAD, resting comfortably Pulm: Normal work of breathing Neuro: Grossly normal, moves all extremities Psych: Normal affect and thought content  Virtual Visit via Video   I connected with [name] on 02/16/20 at  4:00 PM EDT by a video enabled telemedicine application and verified that I am speaking with the correct person using two identifiers. The limitations of evaluation and management by telemedicine and the availability of in person appointments were discussed. The patient expressed understanding and agreed to proceed.   Patient location: Patient's private vehicle Provider location: [place] participating in the virtual visit: Myself and Patient  Time Spent: 45  minutes of total time was spent on the date of the encounter performing the following actions: chart review prior to seeing the patient, obtaining history, performing a medically necessary exam, counseling on the treatment plan, placing orders, and documenting in our EHR.       [name]. [name], MD 02/16/2020 4:01 PM

## 2020-02-22 DIAGNOSIS — H01019 Ulcerative blepharitis unspecified eye, unspecified eyelid: Secondary | ICD-10-CM | POA: Diagnosis not present

## 2020-02-28 DIAGNOSIS — L814 Other melanin hyperpigmentation: Secondary | ICD-10-CM | POA: Diagnosis not present

## 2020-02-28 DIAGNOSIS — L821 Other seborrheic keratosis: Secondary | ICD-10-CM | POA: Diagnosis not present

## 2020-02-28 DIAGNOSIS — D1801 Hemangioma of skin and subcutaneous tissue: Secondary | ICD-10-CM | POA: Diagnosis not present

## 2020-02-28 DIAGNOSIS — D229 Melanocytic nevi, unspecified: Secondary | ICD-10-CM | POA: Diagnosis not present

## 2020-02-28 DIAGNOSIS — D485 Neoplasm of uncertain behavior of skin: Secondary | ICD-10-CM | POA: Diagnosis not present

## 2020-02-28 DIAGNOSIS — L570 Actinic keratosis: Secondary | ICD-10-CM | POA: Diagnosis not present

## 2020-02-28 DIAGNOSIS — L819 Disorder of pigmentation, unspecified: Secondary | ICD-10-CM | POA: Diagnosis not present

## 2020-03-15 DIAGNOSIS — L570 Actinic keratosis: Secondary | ICD-10-CM | POA: Diagnosis not present

## 2020-03-15 DIAGNOSIS — M545 Low back pain, unspecified: Secondary | ICD-10-CM | POA: Diagnosis not present

## 2020-03-15 DIAGNOSIS — R519 Headache, unspecified: Secondary | ICD-10-CM | POA: Diagnosis not present

## 2020-03-15 DIAGNOSIS — R201 Hypoesthesia of skin: Secondary | ICD-10-CM | POA: Diagnosis not present

## 2020-03-15 DIAGNOSIS — L981 Factitial dermatitis: Secondary | ICD-10-CM | POA: Diagnosis not present

## 2020-03-15 DIAGNOSIS — G5603 Carpal tunnel syndrome, bilateral upper limbs: Secondary | ICD-10-CM | POA: Diagnosis not present

## 2020-03-15 DIAGNOSIS — L905 Scar conditions and fibrosis of skin: Secondary | ICD-10-CM | POA: Diagnosis not present

## 2020-03-15 DIAGNOSIS — M542 Cervicalgia: Secondary | ICD-10-CM | POA: Diagnosis not present

## 2020-03-26 ENCOUNTER — Other Ambulatory Visit: Payer: Medicare Other

## 2020-03-26 DIAGNOSIS — Z20822 Contact with and (suspected) exposure to covid-19: Secondary | ICD-10-CM

## 2020-03-27 LAB — NOVEL CORONAVIRUS, NAA: SARS-CoV-2, NAA: NOT DETECTED

## 2020-03-27 LAB — SARS-COV-2, NAA 2 DAY TAT

## 2020-03-28 ENCOUNTER — Telehealth: Payer: Self-pay

## 2020-03-28 NOTE — Telephone Encounter (Signed)
Patient called in asking for the results of COVID test to which was negative, so patient wants to know what the next steps are. If he can come in the office or if Dr.Parker needs to prescribe anything as he is still sick.

## 2020-03-28 NOTE — Telephone Encounter (Signed)
Pt need OV or virtual visit  Last visit was 02/10/2020 Unable to contact pt

## 2020-03-28 NOTE — Telephone Encounter (Signed)
Patient is scheduled for tomorrow with Dr.kim.

## 2020-03-29 ENCOUNTER — Telehealth: Payer: Medicare Other | Admitting: Family Medicine

## 2020-03-30 ENCOUNTER — Telehealth (INDEPENDENT_AMBULATORY_CARE_PROVIDER_SITE_OTHER): Payer: Medicare Other | Admitting: Family Medicine

## 2020-03-30 VITALS — Ht 72.0 in | Wt 165.0 lb

## 2020-03-30 DIAGNOSIS — J329 Chronic sinusitis, unspecified: Secondary | ICD-10-CM

## 2020-03-30 MED ORDER — AMOXICILLIN-POT CLAVULANATE 875-125 MG PO TABS: 1.0000 | ORAL_TABLET | Freq: Two times a day (BID) | ORAL | 0 refills | Status: DC

## 2020-03-30 NOTE — Progress Notes (Signed)
   David Armstrong is a 69 y.o. male who presents today for a telephone visit.  Assessment/Plan:  Sinusitis Given symptoms have been persistent for the past week we will start Augmentin.  He will continue Astelin.  He does not want any cough medication for the time being.  Encourage good oral hydration.  Discussed reasons return to care.  Follow-up as needed.    Subjective:  HPI:  Patient sick for the last week. Symptoms include cough, congestion, and sputum production. Has tried nasal spray which has not helped.  No fevers or chills.  Had one sick contact.        Objective/Observations   NAD  Telephone Visit   I connected with [name] on 03/30/20 at  3:40 PM EST via telephone and verified that I am speaking with the correct person using two identifiers. I discussed the limitations of evaluation and management by telemedicine and the availability of in person appointments. The patient expressed understanding and agreed to proceed.   Patient location: Home Provider location: [place] participating in the virtual visit: Myself and Patient  A total of 11 minutes were spent on medical discussion.      [name]. [name], MD 03/30/2020 3:31 PM

## 2020-05-24 DIAGNOSIS — M545 Low back pain, unspecified: Secondary | ICD-10-CM | POA: Diagnosis not present

## 2020-05-24 DIAGNOSIS — R201 Hypoesthesia of skin: Secondary | ICD-10-CM | POA: Diagnosis not present

## 2020-05-24 DIAGNOSIS — R252 Cramp and spasm: Secondary | ICD-10-CM | POA: Diagnosis not present

## 2020-05-24 DIAGNOSIS — M542 Cervicalgia: Secondary | ICD-10-CM | POA: Diagnosis not present

## 2020-05-24 DIAGNOSIS — G603 Idiopathic progressive neuropathy: Secondary | ICD-10-CM | POA: Diagnosis not present

## 2020-06-07 ENCOUNTER — Other Ambulatory Visit: Payer: Self-pay

## 2020-06-07 ENCOUNTER — Encounter: Payer: Self-pay | Admitting: Family Medicine

## 2020-06-07 ENCOUNTER — Telehealth: Payer: Self-pay

## 2020-06-07 ENCOUNTER — Ambulatory Visit (INDEPENDENT_AMBULATORY_CARE_PROVIDER_SITE_OTHER): Payer: Medicare Other | Admitting: Family Medicine

## 2020-06-07 VITALS — BP 114/76 | HR 86 | Temp 97.6°F | Ht 72.0 in | Wt 168.4 lb

## 2020-06-07 DIAGNOSIS — L219 Seborrheic dermatitis, unspecified: Secondary | ICD-10-CM | POA: Diagnosis not present

## 2020-06-07 DIAGNOSIS — R600 Localized edema: Secondary | ICD-10-CM | POA: Diagnosis not present

## 2020-06-07 DIAGNOSIS — K589 Irritable bowel syndrome without diarrhea: Secondary | ICD-10-CM | POA: Diagnosis not present

## 2020-06-07 DIAGNOSIS — I824Z1 Acute embolism and thrombosis of unspecified deep veins of right distal lower extremity: Secondary | ICD-10-CM

## 2020-06-07 DIAGNOSIS — M1990 Unspecified osteoarthritis, unspecified site: Secondary | ICD-10-CM

## 2020-06-07 DIAGNOSIS — Z86711 Personal history of pulmonary embolism: Secondary | ICD-10-CM

## 2020-06-07 MED ORDER — METHYLPREDNISOLONE ACETATE 80 MG/ML IJ SUSP: 80.0000 mg | Freq: Once | INTRAMUSCULAR | Status: DC

## 2020-06-07 MED ORDER — METHYLPREDNISOLONE ACETATE 80 MG/ML IJ SUSP
80.0000 mg | Freq: Once | INTRAMUSCULAR | Status: AC
  Administered 2020-06-07: 80 mg via INTRAMUSCULAR

## 2020-06-07 MED ORDER — DESONIDE 0.05 % EX LOTN: TOPICAL_LOTION | CUTANEOUS | 2 refills | Status: DC

## 2020-06-07 MED ORDER — KETOROLAC TROMETHAMINE 60 MG/2ML IM SOLN
60.0000 mg | Freq: Once | INTRAMUSCULAR | Status: AC
  Administered 2020-06-07: 60 mg via INTRAMUSCULAR

## 2020-06-07 MED ORDER — HYOSCYAMINE SULFATE 0.125 MG SL SUBL: SUBLINGUAL_TABLET | SUBLINGUAL | 3 refills | Status: DC

## 2020-06-07 NOTE — Progress Notes (Signed)
   David Armstrong is a 70 y.o. male who presents today for an office visit.  Assessment/Plan:  Chronic Problems Addressed Today: History of pulmonary embolism and DVT (Oct 2017) Anticoagulated on Xarelto.  Will recheck ultrasound.  IBS (irritable bowel syndrome) We will refill hyoscyamine.  He is tolerating well without side effects.  Osteoarthritis Patient with worsening back pain.  Likely has some component of lumbar radiculopathy given known degenerative disc disease.  He has been following with neurology and will be getting nerve conduction study soon.  No red flags today.  We will give 60 mg of Toradol and 80 mg of Depomedrol. Discussed reasons to return to care.   DVT (deep venous thrombosis) (HCC) No signs of recurrence though does have some leg edema today likely secondary to venous insufficiency.  Unlikely that his edema and pain is secondary to DVT given that he has been compliant with his Xarelto.  Will recheck ultrasound to confirm stability of DVT.  He will continue Xarelto.  Leg edema No red flags.  Has seen vascular surgery in the past.  Likely secondary to venous insufficiency.  Discussed conservative measures including leg elevation and salt avoidance.  Will give order for compression stockings today as well.  Seborrheic dermatitis Stable.  Refill desonide.     Subjective:  HPI:  Patient here with worsening leg pain located on the right side.  Has a history of peripheral neuropathy.  No obvious injuries or precipitating events.  He is on Xarelto 20 mg daily for history of DVT.  Has been tolerating well.       Objective:  Physical Exam: BP 114/76   Pulse 86   Temp 97.6 F (36.4 C) (Temporal)   Ht 6' (1.829 m)   Wt 168 lb 6.4 oz (76.4 kg)   SpO2 99%   BMI 22.84 kg/m   Gen: No acute distress, resting comfortably Neuro: Grossly normal, moves all extremities MSK: 1+ pitting edema to shins bilaterally Psych: Normal affect and thought content  Time  Spent: 48 minutes of total time was spent on the date of the encounter performing the following actions: chart review prior to seeing the patient, obtaining history, performing a medically necessary exam, counseling on the treatment plan including need for neurology follow up, placing orders, and documenting in our EHR.        [name]. [name], MD 06/07/2020 2:17 PM

## 2020-06-07 NOTE — Assessment & Plan Note (Signed)
No signs of recurrence though does have some leg edema today likely secondary to venous insufficiency.  Unlikely that his edema and pain is secondary to DVT given that he has been compliant with his Xarelto.  Will recheck ultrasound to confirm stability of DVT.  He will continue Xarelto.

## 2020-06-07 NOTE — Patient Instructions (Addendum)
It was very nice to see you today!  We will give you anti inflammatory injections today.  You have arthritis and may have a pinched nerve.   Please follow up with your neurologist soon to discuss further testing.   We will check ultrasound of your legs today.   Take care, Dr [name]  Please try these tips to maintain a healthy lifestyle:   Eat at least 3 REAL meals and 1-2 snacks per day.  Aim for no more than 5 hours between eating.  If you eat breakfast, please do so within one hour of getting up.    Each meal should contain half fruits/vegetables, one quarter protein, and one quarter carbs (no bigger than a computer mouse)   Cut down on sweet beverages. This includes juice, soda, and sweet tea.     Drink at least 1 glass of water with each meal and aim for at least 8 glasses per day   Exercise at least 150 minutes every week.

## 2020-06-07 NOTE — Assessment & Plan Note (Signed)
We will refill hyoscyamine.  He is tolerating well without side effects.

## 2020-06-07 NOTE — Assessment & Plan Note (Signed)
Patient with worsening back pain.  Likely has some component of lumbar radiculopathy given known degenerative disc disease.  He has been following with neurology and will be getting nerve conduction study soon.  No red flags today.  We will give 60 mg of Toradol and 80 mg of Depomedrol. Discussed reasons to return to care.

## 2020-06-07 NOTE — Assessment & Plan Note (Signed)
Anticoagulated on Xarelto.  Will recheck ultrasound.

## 2020-06-07 NOTE — Telephone Encounter (Signed)
Pt wants his [facility].

## 2020-06-07 NOTE — Assessment & Plan Note (Signed)
No red flags.  Has seen vascular surgery in the past.  Likely secondary to venous insufficiency.  Discussed conservative measures including leg elevation and salt avoidance.  Will give order for compression stockings today as well.

## 2020-06-07 NOTE — Assessment & Plan Note (Signed)
Stable.  Refill desonide.

## 2020-06-08 ENCOUNTER — Other Ambulatory Visit: Payer: Self-pay | Admitting: *Deleted

## 2020-06-08 DIAGNOSIS — I824Z1 Acute embolism and thrombosis of unspecified deep veins of right distal lower extremity: Secondary | ICD-10-CM

## 2020-06-08 NOTE — Telephone Encounter (Signed)
Sent referral to [facility]

## 2020-06-08 NOTE — Telephone Encounter (Signed)
Pt is following up on this 

## 2020-06-11 ENCOUNTER — Other Ambulatory Visit: Payer: Self-pay

## 2020-06-11 ENCOUNTER — Ambulatory Visit (HOSPITAL_COMMUNITY)
Admission: RE | Admit: 2020-06-11 | Discharge: 2020-06-11 | Disposition: A | Discharge status: Home / Self Care | Payer: Medicare Other | Source: Ambulatory Visit | Attending: Family Medicine | Admitting: Family Medicine

## 2020-06-11 ENCOUNTER — Encounter: Payer: Self-pay | Admitting: Family Medicine

## 2020-06-11 DIAGNOSIS — I824Z1 Acute embolism and thrombosis of unspecified deep veins of right distal lower extremity: Secondary | ICD-10-CM | POA: Insufficient documentation

## 2020-06-11 DIAGNOSIS — R600 Localized edema: Secondary | ICD-10-CM | POA: Diagnosis not present

## 2020-06-12 ENCOUNTER — Telehealth: Payer: Self-pay

## 2020-06-12 ENCOUNTER — Encounter: Payer: Self-pay | Admitting: Family Medicine

## 2020-06-12 NOTE — Telephone Encounter (Signed)
He needs to stay on blood thinner for the rest of his life. Recommend he follow up with his vascular surgeon to discuss clot.   Recommend OV to go over in detail if he has further questions.  David Armstrong. [name], MD 06/12/2020 11:25 AM

## 2020-06-12 NOTE — Progress Notes (Signed)
Please inform patient of the following:  His ultrasound shows chronic blood clot. This is not a new clot but one that he has had. He needs to stay on the blood thinner.  David Armstrong. [name], MD 06/12/2020 8:37 AM

## 2020-06-12 NOTE — Telephone Encounter (Signed)
Patient is aware of Dr. Ellwood Handler comments, gave a verbal understanding and he states that he will talk to his vascular surgeon. And he will be scheduling an appointment to come in the office.

## 2020-06-12 NOTE — Telephone Encounter (Signed)
Patient is now aware of his lab results gave a verbal understanding. But patient has a lot off questions about his results. He wants to know how much blockage he has and how big is the clot. He wants to know how much longer does he have to be on this blood thinner. He also wants to know if he can go ahead and put a stint in. He wants to know has the clot changed over time? (at first it was 50% has it gotten better or worst.)

## 2020-06-12 NOTE — Telephone Encounter (Signed)
Patient is calling in stating he missed a call about his [place] results.

## 2020-06-12 NOTE — Telephone Encounter (Signed)
Notified patient of lab results.Patient voices understanding.  

## 2020-06-14 NOTE — Telephone Encounter (Signed)
Results given.

## 2020-06-18 ENCOUNTER — Encounter: Payer: Self-pay | Admitting: Family Medicine

## 2020-06-18 ENCOUNTER — Telehealth (INDEPENDENT_AMBULATORY_CARE_PROVIDER_SITE_OTHER): Payer: Medicare Other | Admitting: Family Medicine

## 2020-06-18 VITALS — Ht 72.0 in | Wt 165.0 lb

## 2020-06-18 DIAGNOSIS — R350 Frequency of micturition: Secondary | ICD-10-CM

## 2020-06-18 DIAGNOSIS — I824Z1 Acute embolism and thrombosis of unspecified deep veins of right distal lower extremity: Secondary | ICD-10-CM

## 2020-06-18 LAB — URINALYSIS, ROUTINE W REFLEX MICROSCOPIC
Bilirubin Urine: NEGATIVE
Ketones, ur: NEGATIVE
Leukocytes,Ua: NEGATIVE
Nitrite: NEGATIVE
Specific Gravity, Urine: >=1.03 — AB (ref 1.000–1.030)
Total Protein, Urine: NEGATIVE
Urine Glucose: NEGATIVE
Urobilinogen, UA: 0.2 (ref 0.0–1.0)
pH: 5.5 (ref 5.0–8.0)

## 2020-06-18 NOTE — Assessment & Plan Note (Signed)
Recent ultrasound showed chronic DVT.  He is on Xarelto 20 mg daily.  He will be following up with vascular surgery to discuss possible IV filter stent placement.  He will stay on anticoagulation for now.  DVT appears to have improved in size since last scan a few months ago.

## 2020-06-18 NOTE — Addendum Note (Signed)
Addended by: Brandy Hale on: 06/18/2020 01:56 PM   Modules accepted: Orders

## 2020-06-18 NOTE — Progress Notes (Signed)
   David Armstrong is a 70 y.o. male who presents today for a telephone visit.  Assessment/Plan:  New/Acute Problems: Urinary Frequency Patient will come in for UA and urine culture.  Chronic Problems Addressed Today: DVT (deep venous thrombosis) (HCC) Recent ultrasound showed chronic DVT.  He is on Xarelto 20 mg daily.  He will be following up with vascular surgery to discuss possible IV filter stent placement.  He will stay on anticoagulation for now.  DVT appears to have improved in size since last scan a few months ago.     Subjective:  HPI:  Patient here to follow-up on recent ultrasound results.  Found to have chronic DVT that improved in size from ultrasound about 3 months ago.  He has also had increased urinary frequency and urgency.  Is concerned about possible UTI.  Also has a history of kidney stones.       Objective/Observations   NAD.   Telephone Visit   I connected with [name] on 06/18/20 at 11:40 AM EST via telephone and verified that I am speaking with the correct person using two identifiers. I discussed the limitations of evaluation and management by telemedicine and the availability of in person appointments. The patient expressed understanding and agreed to proceed.   Patient location: Home Provider location: [place] participating in the virtual visit: Myself and Patient   A total of 11 minutes were spent on medical discussion.      [name]. [name], MD 06/18/2020 12:20 PM

## 2020-06-19 LAB — URINE CULTURE
MICRO NUMBER:: 11615576
SPECIMEN QUALITY:: ADEQUATE

## 2020-06-20 ENCOUNTER — Other Ambulatory Visit: Payer: Self-pay | Admitting: *Deleted

## 2020-06-20 DIAGNOSIS — N200 Calculus of kidney: Secondary | ICD-10-CM

## 2020-06-20 NOTE — Progress Notes (Signed)
Please inform patient of the following:  Urine culture negative for UTI though he did have some blood in his urine.  This could be due to a kidney stone but I would like for him to come back to recheck UA next week.

## 2020-06-20 NOTE — Progress Notes (Signed)
Urine

## 2020-06-22 ENCOUNTER — Other Ambulatory Visit: Payer: Self-pay | Admitting: *Deleted

## 2020-06-22 DIAGNOSIS — N200 Calculus of kidney: Secondary | ICD-10-CM

## 2020-06-25 ENCOUNTER — Ambulatory Visit
Admission: RE | Admit: 2020-06-25 | Discharge: 2020-06-25 | Disposition: A | Discharge status: Home / Self Care | Payer: Medicare Other | Source: Ambulatory Visit | Attending: Family Medicine | Admitting: Family Medicine

## 2020-06-25 ENCOUNTER — Other Ambulatory Visit: Payer: Self-pay | Admitting: Family Medicine

## 2020-06-25 ENCOUNTER — Other Ambulatory Visit: Payer: Self-pay

## 2020-06-25 DIAGNOSIS — K5730 Diverticulosis of large intestine without perforation or abscess without bleeding: Secondary | ICD-10-CM | POA: Diagnosis not present

## 2020-06-25 DIAGNOSIS — N200 Calculus of kidney: Secondary | ICD-10-CM

## 2020-06-25 DIAGNOSIS — K449 Diaphragmatic hernia without obstruction or gangrene: Secondary | ICD-10-CM | POA: Diagnosis not present

## 2020-06-25 NOTE — Progress Notes (Signed)
Please inform patient of the following:  He has several small stones that should pass on their own. Recommend he follow up with urology if still having issues.  [name]. [name], MD 06/25/2020 2:30 PM

## 2020-07-05 ENCOUNTER — Telehealth (INDEPENDENT_AMBULATORY_CARE_PROVIDER_SITE_OTHER): Payer: Medicare Other | Admitting: Family Medicine

## 2020-07-05 ENCOUNTER — Telehealth: Payer: Medicare Other

## 2020-07-05 ENCOUNTER — Ambulatory Visit: Payer: Medicare Other | Attending: Critical Care Medicine

## 2020-07-05 DIAGNOSIS — G5603 Carpal tunnel syndrome, bilateral upper limbs: Secondary | ICD-10-CM | POA: Diagnosis not present

## 2020-07-05 DIAGNOSIS — R0982 Postnasal drip: Secondary | ICD-10-CM

## 2020-07-05 DIAGNOSIS — Z20822 Contact with and (suspected) exposure to covid-19: Secondary | ICD-10-CM | POA: Diagnosis not present

## 2020-07-05 DIAGNOSIS — J029 Acute pharyngitis, unspecified: Secondary | ICD-10-CM | POA: Diagnosis not present

## 2020-07-05 DIAGNOSIS — R252 Cramp and spasm: Secondary | ICD-10-CM | POA: Diagnosis not present

## 2020-07-05 DIAGNOSIS — R519 Headache, unspecified: Secondary | ICD-10-CM | POA: Diagnosis not present

## 2020-07-05 DIAGNOSIS — G603 Idiopathic progressive neuropathy: Secondary | ICD-10-CM | POA: Diagnosis not present

## 2020-07-05 DIAGNOSIS — N400 Enlarged prostate without lower urinary tract symptoms: Secondary | ICD-10-CM | POA: Diagnosis not present

## 2020-07-05 DIAGNOSIS — G5623 Lesion of ulnar nerve, bilateral upper limbs: Secondary | ICD-10-CM | POA: Diagnosis not present

## 2020-07-05 DIAGNOSIS — M5417 Radiculopathy, lumbosacral region: Secondary | ICD-10-CM | POA: Diagnosis not present

## 2020-07-05 DIAGNOSIS — M5412 Radiculopathy, cervical region: Secondary | ICD-10-CM | POA: Diagnosis not present

## 2020-07-05 DIAGNOSIS — N200 Calculus of kidney: Secondary | ICD-10-CM | POA: Diagnosis not present

## 2020-07-05 DIAGNOSIS — R0981 Nasal congestion: Secondary | ICD-10-CM | POA: Diagnosis not present

## 2020-07-05 MED ORDER — AMOXICILLIN-POT CLAVULANATE 875-125 MG PO TABS: 1.0000 | ORAL_TABLET | Freq: Two times a day (BID) | ORAL | 0 refills | Status: DC

## 2020-07-05 NOTE — Progress Notes (Signed)
Virtual Visit via Telephone Note  I connected with David Armstrong on 07/05/20 at  6:00 PM EDT by telephone and verified that I am speaking with the correct person using two identifiers.   I discussed the limitations, risks, security and privacy concerns of performing an evaluation and management service by telephone and the availability of in person appointments. I also discussed with the patient that there may be a patient responsible charge related to this service. The patient expressed understanding and agreed to proceed.  Location patient: car, [place] Location provider: work or home office Participants present for the call: patient, provider Patient did not have a visit with me in the prior 7 days to address this/these issue(s).   History of Present Illness:  Acute telemedicine visit for Sore throat/dinus issues: -Onset:  About a month, worsening the last few days/week -Symptoms include:sneezing, nasal congestion, PND, some clearing of throat at times, sore throat, now with green mucus, pressure in the sinuses/sinus discomfort -Denies: fevers, SOB, CP, NVD, body aches, malaise, inability to eat/drink/get out of bed -Pertinent past medical history: allergies, GERD -Pertinent medication allergies: sulfa abx -COVID-19 vaccine status: had 2 doses + booster had flu shot   Observations/Objective: Patient sounds cheerful and well on the phone. I do not appreciate any SOB. Speech and thought processing are grossly intact. Patient reported vitals:  Assessment and Plan:  Nasal congestion  PND (post-nasal drip)  Sore throat  -we discussed possible serious and likely etiologies, options for evaluation and workup, limitations of telemedicine visit vs in person visit, treatment, treatment risks and precautions. Pt prefers to treat via telemedicine empirically rather than in person at this moment. Query underlying allergic rhinitis with possible developing sinusitis vs other. He opted to try  and allergy pill (Allegra) once daily and start Augmentin if worsening or not improving on the the Allegra. Discussed other potential etiologies and advised to seek prompt in person care if worsening, new symptoms arise, or if is not improving with treatment. Discussed options for inperson care if PCP office not available. Did let this patient know that I only do telemedicine on Tuesdays and Thursdays for [place]. Advised to schedule follow up visit with PCP or UCC if any further questions or concerns to avoid delays in care.  Follow Up Instructions:  I did not refer this patient for an OV with me in the next 24 hours for this/these issue(s).  I discussed the assessment and treatment plan with the patient. The patient was provided an opportunity to ask questions and all were answered. The patient agreed with the plan and demonstrated an understanding of the instructions.   I spent 23 minutes on the date of this visit in the care of this patient. See summary of tasks completed to properly care for this patient in the detailed notes above which also included counseling of above, review of PMH, medications, allergies, evaluation of the patient and ordering and/or  instructing patient on testing and care options.     Lucretia Kern, DO

## 2020-07-05 NOTE — Patient Instructions (Signed)
-  I sent the medication(s) we discussed to your pharmacy: Meds ordered this encounter  Medications  . amoxicillin-clavulanate (AUGMENTIN) 875-125 MG tablet    Sig: Take 1 tablet by mouth 2 (two) times daily.    Dispense:  20 tablet    Refill:  0     I hope you are feeling better soon!  Seek in person care promptly if your symptoms worsen, new concerns arise or you are not improving with treatment.  It was nice to meet you today. I help [facility] out with telemedicine visits on Tuesdays and Thursdays and am available for visits on those days. If you have any concerns or questions following this visit please schedule a follow up visit with your Primary Care doctor or seek care at a local urgent care clinic to avoid delays in care.   

## 2020-07-06 LAB — SARS-COV-2, NAA 2 DAY TAT

## 2020-07-06 LAB — NOVEL CORONAVIRUS, NAA: SARS-CoV-2, NAA: NOT DETECTED

## 2020-07-10 ENCOUNTER — Other Ambulatory Visit: Payer: Self-pay | Admitting: *Deleted

## 2020-07-10 ENCOUNTER — Telehealth: Payer: Self-pay

## 2020-07-10 NOTE — Telephone Encounter (Signed)
Ok with me. Please place any necessary orders. 

## 2020-07-10 NOTE — Telephone Encounter (Signed)
See note

## 2020-07-10 NOTE — Telephone Encounter (Signed)
Pt is requesting a referral to Dr. Servando Snare at [facility]. He wants the Dr. To look over the ultrasounds he has had done on his leg.

## 2020-07-13 ENCOUNTER — Ambulatory Visit (INDEPENDENT_AMBULATORY_CARE_PROVIDER_SITE_OTHER): Payer: Medicare Other | Admitting: Family Medicine

## 2020-07-13 ENCOUNTER — Other Ambulatory Visit: Payer: Self-pay

## 2020-07-13 VITALS — BP 129/84 | HR 83 | Temp 98.6°F | Ht 72.0 in | Wt 171.2 lb

## 2020-07-13 DIAGNOSIS — I824Z1 Acute embolism and thrombosis of unspecified deep veins of right distal lower extremity: Secondary | ICD-10-CM

## 2020-07-13 DIAGNOSIS — F411 Generalized anxiety disorder: Secondary | ICD-10-CM

## 2020-07-13 DIAGNOSIS — Z86711 Personal history of pulmonary embolism: Secondary | ICD-10-CM | POA: Diagnosis not present

## 2020-07-13 DIAGNOSIS — M1990 Unspecified osteoarthritis, unspecified site: Secondary | ICD-10-CM

## 2020-07-13 DIAGNOSIS — J309 Allergic rhinitis, unspecified: Secondary | ICD-10-CM

## 2020-07-13 MED ORDER — PREDNISONE 50 MG PO TABS: ORAL_TABLET | ORAL | 0 refills | Status: DC

## 2020-07-13 MED ORDER — AZELASTINE HCL 0.1 % NA SOLN: 2.0000 | Freq: Two times a day (BID) | NASAL | 12 refills | Status: DC

## 2020-07-13 MED ORDER — MEDICAL COMPRESSION STOCKINGS MISC: 0 refills | Status: DC

## 2020-07-13 MED ORDER — AZITHROMYCIN 250 MG PO TABS: ORAL_TABLET | ORAL | 0 refills | Status: DC

## 2020-07-13 NOTE — Assessment & Plan Note (Signed)
Will place referral to vascular surgery per patient request. He is on xarelto 20mg  daily.

## 2020-07-13 NOTE — Assessment & Plan Note (Signed)
History of right knee replacement. Pain has been stable.

## 2020-07-13 NOTE — Assessment & Plan Note (Signed)
Worsened and likely contributing to sinus infection. Will restart astelin.

## 2020-07-13 NOTE — Progress Notes (Signed)
   David Armstrong is a 70 y.o. male who presents today for an office visit.  Assessment/Plan:  New/Acute Problems: Sinusitis Still not improved despite augmentin. Symptoms have been present for a few months at this point. Will start astelin, azithromycin, and prednisone. If symptoms do not improve he will need to follow up with ENT. Discussed reasons to return to care.   Chronic Problems Addressed Today: History of pulmonary embolism and DVT (Oct 2017) Continue xarelto 20mg  daily.   Osteoarthritis History of right knee replacement. Pain has been stable.   Allergic rhinitis Worsened and likely contributing to sinus infection. Will restart astelin.   Anxiety state Managed by psychiatry. Symptoms are stable.   DVT (deep venous thrombosis) ([place]) Will place referral to vascular surgery per patient request. He is on xarelto 20mg  daily.      Subjective:  HPI:  Patient here for follow-up.  Had virtual visit with the provider week ago for nasal congestion.  At that time was having about a few months of of nasal congestion.  He was started on Allegra and Augmentin.  Symptoms have not improved.  No fevers or chills. No chest pain.   See a/p for status of chronic conditions.        Objective:  Physical Exam: BP 129/84   Pulse 83   Temp 98.6 F (37 C) (Temporal)   Ht 6' (1.829 m)   Wt 171 lb 3.2 oz (77.7 kg)   SpO2 96%   BMI 23.22 kg/m   Gen: No acute distress, resting comfortably Neuro: Grossly normal, moves all extremities Psych: Normal affect and thought content  Time Spent: 45 minutes of total time was spent on the date of the encounter performing the following actions: chart review prior to seeing the patient including recent virtual visit with Dr Maudie Mercury, obtaining history, performing a medically necessary exam, counseling on the treatment plan, placing orders, and documenting in our EHR.        [name]. [name], MD 07/13/2020 1:36 PM

## 2020-07-13 NOTE — Assessment & Plan Note (Signed)
Managed by psychiatry.  Symptoms are stable. 

## 2020-07-13 NOTE — Assessment & Plan Note (Signed)
Continue xarelto 20 mg daily. 

## 2020-07-13 NOTE — Patient Instructions (Addendum)
It was very nice to see you today!  Please start the prednisone, nasal spray and azithromycin.   Please let me know if your symptoms are not improving and we will get you in to see ENT or get a CT scan.   Take care, Dr [name]  PLEASE NOTE:  If you had any lab tests please let us know if you have not heard back within a few days. You may see your results on mychart before we have a chance to review them but we will give you a call once they are reviewed by [place]. If we ordered any referrals today, please let us know if you have not heard from their office within the next week.   Please try these tips to maintain a healthy lifestyle:   Eat at least 3 REAL meals and 1-2 snacks per day.  Aim for no more than 5 hours between eating.  If you eat breakfast, please do so within one hour of getting up.    Each meal should contain half fruits/vegetables, one quarter protein, and one quarter carbs (no bigger than a computer mouse)   Cut down on sweet beverages. This includes juice, soda, and sweet tea.     Drink at least 1 glass of water with each meal and aim for at least 8 glasses per day   Exercise at least 150 minutes every week.

## 2020-07-18 DIAGNOSIS — H269 Unspecified cataract: Secondary | ICD-10-CM | POA: Insufficient documentation

## 2020-07-18 DIAGNOSIS — F259 Schizoaffective disorder, unspecified: Secondary | ICD-10-CM | POA: Insufficient documentation

## 2020-07-19 DIAGNOSIS — R519 Headache, unspecified: Secondary | ICD-10-CM | POA: Diagnosis not present

## 2020-07-19 DIAGNOSIS — R2689 Other abnormalities of gait and mobility: Secondary | ICD-10-CM | POA: Diagnosis not present

## 2020-07-19 DIAGNOSIS — M545 Low back pain, unspecified: Secondary | ICD-10-CM | POA: Diagnosis not present

## 2020-07-19 DIAGNOSIS — M542 Cervicalgia: Secondary | ICD-10-CM | POA: Diagnosis not present

## 2020-07-19 DIAGNOSIS — I82409 Acute embolism and thrombosis of unspecified deep veins of unspecified lower extremity: Secondary | ICD-10-CM | POA: Diagnosis not present

## 2020-07-19 DIAGNOSIS — R201 Hypoesthesia of skin: Secondary | ICD-10-CM | POA: Diagnosis not present

## 2020-07-20 ENCOUNTER — Ambulatory Visit (INDEPENDENT_AMBULATORY_CARE_PROVIDER_SITE_OTHER): Payer: Medicare Other | Admitting: Physician Assistant

## 2020-07-20 ENCOUNTER — Other Ambulatory Visit: Payer: Self-pay

## 2020-07-20 VITALS — BP 123/82 | HR 79 | Temp 98.6°F | Resp 20 | Ht 72.0 in | Wt 172.1 lb

## 2020-07-20 DIAGNOSIS — I872 Venous insufficiency (chronic) (peripheral): Secondary | ICD-10-CM | POA: Diagnosis not present

## 2020-07-20 DIAGNOSIS — I82511 Chronic embolism and thrombosis of right femoral vein: Secondary | ICD-10-CM | POA: Diagnosis not present

## 2020-07-20 DIAGNOSIS — M7989 Other specified soft tissue disorders: Secondary | ICD-10-CM

## 2020-07-20 NOTE — Progress Notes (Signed)
Requested by:  Vivi Barrack, MD [address] [place],  [place] [address]  Reason for consultation: chronic DVT  History of Present Illness   David Armstrong is a 70 y.o. (October 18, 1950) male who presents for evaluation of DVT. He has history of DVT and PE in the RLE in 2017. He was not found to have any provoking cause for the DVT. He was treated with Xarelto for 8 months and transitioned to Aspirin after that. He subsequently had recurrent RLE DVT in October of 2021 and was placed back on Xarelto indefinitely. 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 He reports that he has swelling in the right leg and also daily aching and heaviness in the right greater than left leg. He does not regularly elevate and was instructed to wear compression stockings but he says they make his leg hurt more so he does not wear them. He otherwise does not report any itching, bleeding, throbbing, ulceration of the right leg. He is not having any cramping or pain on ambulation or at rest. No tissue loss. He has no family history of DVT.  Venous symptoms include: aching, heavy, tired, swelling Onset/duration:  5 years Occupation:  retired Aggravating factors: sitting, standing Alleviating factors: none  Compression:  Yes, knee hight Helps: no Pain medications: none Previous vein procedures: none History of DVT: yes, 2017  Past Medical History:  Diagnosis Date  . Anxiety   . Arthritis    "lower back; hips;' right knee"   . Bipolar disorder ([place])   . Chronic constipation   . Depression   . Diverticulosis of colon   . Edema of both lower extremities    ANKLES  . Fatty liver   . GERD (gastroesophageal reflux disease)   . Hiatal hernia   . History of anal fissures   . History of deep vein thrombosis (DVT) of lower extremity 02/11/2016   right lower extremity distal and proximal extensive acute  . History of Helicobacter  pylori infection 2012; 2009  . History of kidney stones   . History of pneumonia 01/2015   CAP  . 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 Hyperlipidemia   . IBS (irritable bowel syndrome)   . Left ureteral stone   . Nocturia   . Peripheral neuropathy    per pt due to back DDD  . Personal history of colonic polyps    HYPERPLASTIC POLYP  . Pulmonary nodule, right    incidental finding CT chest angio 02-11-2016 stable  . TMJ (temporomandibular joint syndrome)   . Varicosities of leg     Past Surgical History:  Procedure Laterality Date  . BELPHAROPTOSIS REPAIR Bilateral 01/2015  . CATARACT EXTRACTION W/ INTRAOCULAR LENS  IMPLANT, BILATERAL  04/2016  . COLONOSCOPY WITH ESOPHAGOGASTRODUODENOSCOPY (EGD)  last one 03-02-2013  . CYSTOSCOPY/RETROGRADE/URETEROSCOPY/STONE EXTRACTION WITH BASKET  2010  . CYSTOSCOPY/URETEROSCOPY/HOLMIUM LASER/STENT PLACEMENT Left 03/16/2017   Procedure: CYSTOSCOPY/RETROGRADE/URETEROSCOPY/HOLMIUM LASER/STENT PLACEMENT, STONE BASKET EXTRACTION AND STENT PLACEMENT;  Surgeon: [name], MD;  Location: [facility];  Service: Urology;  Laterality: Left;  . EXCISIONAL HEMORRHOIDECTOMY  2015 approx.  Marland Kitchen EXTRACORPOREAL SHOCK WAVE LITHOTRIPSY  1987; 1992  . FOOT NEUROMA SURGERY Bilateral left 09/2015; right 04/ 2017   morton' neuroma  . FOOT NEUROMA SURGERY Right 2004   morton's neuroma and removal heel spur  . HAMMER TOE  SURGERY Left 2013   5th toe  . INCISION AND DRAINAGE FOOT Left 1992   "ball of foot; stepped on piece of hard wire & it went thru my shoe"  . KNEE ARTHROSCOPY Bilateral left x2 1997;  right 2004 & 2005  . NASAL FRACTURE SURGERY  1997  . PATELLA-FEMORAL ARTHROPLASTY Right 07-06-2003   dr Eddie Dibbles [facility]  . PLANTAR FASCIA RELEASE Bilateral 1987 & 1993  . RHINOPLASTY  2000  . SHOULDER ARTHROSCOPY Left 2012;  2013  . SHOULDER ARTHROSCOPY WITH DISTAL CLAVICLE RESECTION Right 07-31-2005   dr Percell Miller  [facility]   w/  Debridement labral and adhesions, acromioplasty, bursectomy, and CA ligament release  . TRANSTHORACIC ECHOCARDIOGRAM  02/12/2016   ef 47-42%, grade 1 diastolic dysfunction/  trivial TR    Social History   Socioeconomic History  . Marital status: Divorced    Spouse name: Not on file  . Number of children: 0  . Years of education: Not on file  . Highest education level: Not on file  Occupational History  . Occupation: retired    Fish farm manager: [place] [employer]    Comment: disabled now  Tobacco Use  . Smoking status: Never Smoker  . Smokeless tobacco: Never Used  Vaping Use  . Vaping Use: Never used  Substance and Sexual Activity  . Alcohol use: No    Alcohol/week: 0.0 standard drinks  . Drug use: No  . Sexual activity: Never  Other Topics Concern  . Not on file  Social History Narrative   Goes by "[name]"   Divorced   No children   Retired from the post office   Has internet girlfriend on facebook from [place]rain: [place]   . Difficulty of Paying Living Expenses: Not hard at all  Food Insecurity: No Food Insecurity  . Worried About Charity fundraiser in the Last Year: Never true  . Ran Out of Food in the Last Year: Never true  Transportation Needs: No Transportation Needs  . Lack of Transportation (Medical): No  . Lack of Transportation (Non-Medical): No  Physical Activity: Sufficiently Active  . Days of Exercise per Week: 5 days  . Minutes of Exercise per Session: 40 min  Stress: Stress Concern Present  . Feeling of Stress : Rather much  Social Connections: Socially Isolated  . Frequency of Communication with Friends and Family: Never  . Frequency of Social Gatherings with Friends and Family: Never  . Attends Religious Services: Never  . Active Member of Clubs or Organizations: No  . Attends Archivist Meetings: Never  . Marital Status: Divorced  Human resources officer Violence: Not At Risk  . Fear of Current  or Ex-Partner: No  . Emotionally Abused: No  . Physically Abused: No  . Sexually Abused: No    Family History  Problem Relation Age of Onset  . Heart disease Mother   . Stroke Mother   . Cirrhosis Father   . Heart disease Father   . Skin cancer Father   . Prostate cancer Maternal Grandfather   . Parkinson's disease Paternal Grandfather   . Heart disease Brother   . Colon cancer Neg Hx   . Stomach cancer Neg Hx     Current Outpatient Medications  Medication Sig Dispense Refill  . azelastine (ASTELIN) 0.1 % nasal spray Place 2 sprays into both nostrils 2 (two) times daily. 30 mL 12  . azithromycin (ZITHROMAX) 250 MG tablet Take 2  tabs day 1, then 1 tab daily 6 each 0  . desonide (DESOWEN) 0.05 % lotion APPLY EXTERNALLY TO THE AFFECTED AREA EVERY DAY AS NEEDED 118 mL 2  . diclofenac (VOLTAREN) 75 MG EC tablet Take 1 tablet (75 mg total) by mouth 2 (two) times daily. 30 tablet 0  . Elastic Bandages & Supports (MEDICAL COMPRESSION STOCKINGS) MISC Use daily. 2 each 0  . fluticasone (FLONASE) 50 MCG/ACT nasal spray INSTILL 2 SPRAYS IN EACH NOSTRIL EVERY DAY AS NEEDED    . hyoscyamine (LEVSIN SL) 0.125 MG SL tablet DISSOLVE 1 TABLET(0.125 MG) UNDER THE TONGUE EVERY 6 HOURS AS NEEDED 120 tablet 3  . LORazepam (ATIVAN) 1 MG tablet Take 0.5-1 tablets (0.5-1 mg total) by mouth every 8 (eight) hours as needed for anxiety. 90 tablet 0  . omeprazole (PRILOSEC) 20 MG capsule TAKE 1 CAPSULE BY MOUTH EVERY DAY AS NEEDED    . predniSONE (DELTASONE) 50 MG tablet Take 1 tablet daily for 5 days. 5 tablet 0  . rivaroxaban (XARELTO) 20 MG TABS tablet Take 1 tablet (20 mg total) by mouth daily with supper. Start 20 mg daily tablets after completion of 3 weeks of 15 mg BID dosing. 90 tablet 1   No current facility-administered medications for this visit.    Allergies  Allergen Reactions  . Effexor Xr [Venlafaxine Hcl Er] Other (See Comments)    Insomnia  . Acyclovir And Related   . Zocor  [Simvastatin] Other (See Comments)    Muscle ache/ pain  . Dexilant [Dexlansoprazole] Other (See Comments)    Muscle aches  . Ivp Dye [Iodinated Diagnostic Agents] Rash  . Soma [Carisoprodol] Other (See Comments)    Sores on arm  . Sulfa Antibiotics Rash    REVIEW OF SYSTEMS (negative unless checked):   Cardiac:  []  Chest pain or chest pressure? []  Shortness of breath upon activity? []  Shortness of breath when lying flat? []  Irregular heart rhythm?  Vascular:  []  Pain in calf, thigh, or hip brought on by walking? []  Pain in feet at night that wakes you up from your sleep? []  Blood clot in your veins? []  Leg swelling?  Pulmonary:  []  Oxygen at home? []  Productive cough? []  Wheezing?  Neurologic:  []  Sudden weakness in arms or legs? []  Sudden numbness in arms or legs? []  Sudden onset of difficult speaking or slurred speech? []  Temporary loss of vision in one eye? []  Problems with dizziness?  Gastrointestinal:  []  Blood in stool? []  Vomited blood?  Genitourinary:  []  Burning when urinating? []  Blood in urine?  Psychiatric:  []  Major depression  Hematologic:  []  Bleeding problems? []  Problems with blood clotting?  Dermatologic:  []  Rashes or ulcers?  Constitutional:  []  Fever or chills?  Ear/Nose/Throat:  []  Change in hearing? []  Nose bleeds? []  Sore throat?  Musculoskeletal:  []  Back pain? []  Joint pain? []  Muscle pain?   Physical Examination     Vitals:   07/20/20 0836  BP: 123/82  Pulse: 79  Resp: 20  Temp: 98.6 F (37 C)  TempSrc: Temporal  SpO2: 98%  Weight: 172 lb 1.6 oz (78.1 kg)  Height: 6' (1.829 m)   Body mass index is 23.34 kg/m.  General:  WDWN in NAD; vital signs documented above Gait: Normal HENT: WNL, normocephalic Pulmonary: normal non-labored breathing , without wheezing Cardiac: regular HR, without  Murmurs without carotid bruit Abdomen: soft, NT, no masses Vascular Exam/Pulses:2 + radial pulses bilaterally, 2+  femoral, 2+ Dp and  PT pulses bilaterally Extremities: without varicose veins, with reticular veins, with mild edema of right greater than left distal leg and ankle, without stasis pigmentation, without lipodermatosclerosis, without ulcers Musculoskeletal: no muscle wasting or atrophy  Neurologic: A&O X 3;  No focal weakness or paresthesias are detected Psychiatric:  The pt has Normal affect.  Non-invasive Vascular Imaging   BLE Venous Insufficiency Duplex (06/11/20):  Summary:  RIGHT:  - There is recanalized thrombus in the right femoral vein(s).  - Findings consistent with chronic deep vein thrombosis involving the right femoral vein.  - Findings consistent with chronic superficial vein thrombosis involving the right small saphenous vein.  - No cystic structure found in the popliteal fossa.    LEFT:  - There is no evidence of deep vein thrombosis in the lower extremity.  - There is no evidence of superficial venous thrombosis.  - No cystic structure found in the popliteal fossa.     Medical Decision Making    JOSUE FALCONI is a 70 y.o. male who presents with chronic RLE FV and SSV DVT. He likely has venous hypertension and insufficiency secondary to chronic vein scarring and damage from recurrent DVTs. He has no new acute DVT and is now on lifelong anticoagulation with Xarelto. I discussed evaluating him with a venous duplex for reflux and discussed that there is possibility for intervention if it is significant enough however likely we will make same recommendations, which are daily elevation, compression stockings, avoidance of prolonged sitting or standing, and exercise.   He would like to have his veins further evaluated for reflux so I will bring him back for a venous reflux study  I discussed with the patient the use of her 20-30 mm thigh high compression stockings. He was measured and fitted for these today in the office  Continue Xarelto 20 mg daily  He will follow up in  next 1 month with reflux study and to review results   Karoline Caldwell, PA-C [facility]: [phone]  07/20/2020, 10:29 AM  Clinic MD: Don[facility] Matters

## 2020-07-25 ENCOUNTER — Other Ambulatory Visit: Payer: Self-pay

## 2020-07-25 DIAGNOSIS — I872 Venous insufficiency (chronic) (peripheral): Secondary | ICD-10-CM

## 2020-07-30 DIAGNOSIS — H61002 Unspecified perichondritis of left external ear: Secondary | ICD-10-CM | POA: Diagnosis not present

## 2020-07-30 DIAGNOSIS — L570 Actinic keratosis: Secondary | ICD-10-CM | POA: Diagnosis not present

## 2020-07-30 DIAGNOSIS — L578 Other skin changes due to chronic exposure to nonionizing radiation: Secondary | ICD-10-CM | POA: Diagnosis not present

## 2020-07-30 DIAGNOSIS — L821 Other seborrheic keratosis: Secondary | ICD-10-CM | POA: Diagnosis not present

## 2020-08-15 ENCOUNTER — Ambulatory Visit: Payer: Medicare Other | Admitting: Family Medicine

## 2020-08-17 ENCOUNTER — Ambulatory Visit (INDEPENDENT_AMBULATORY_CARE_PROVIDER_SITE_OTHER): Payer: Medicare Other | Admitting: Physician Assistant

## 2020-08-17 ENCOUNTER — Ambulatory Visit (HOSPITAL_COMMUNITY)
Admission: RE | Admit: 2020-08-17 | Discharge: 2020-08-17 | Disposition: A | Discharge status: Home / Self Care | Payer: Medicare Other | Source: Ambulatory Visit | Attending: Vascular Surgery | Admitting: Vascular Surgery

## 2020-08-17 ENCOUNTER — Other Ambulatory Visit: Payer: Self-pay

## 2020-08-17 VITALS — BP 111/77 | HR 85 | Temp 98.2°F | Resp 18 | Ht 72.0 in | Wt 168.0 lb

## 2020-08-17 DIAGNOSIS — I872 Venous insufficiency (chronic) (peripheral): Secondary | ICD-10-CM

## 2020-08-17 DIAGNOSIS — M7989 Other specified soft tissue disorders: Secondary | ICD-10-CM

## 2020-08-17 DIAGNOSIS — I82511 Chronic embolism and thrombosis of right femoral vein: Secondary | ICD-10-CM | POA: Diagnosis not present

## 2020-08-17 NOTE — Progress Notes (Signed)
CC:  Chronic DVT  HPI:  This is a 70 y.o. male who is here for f/u DVT study.  He has history of DVT and PE in the RLE in 2017. He was not found to have any provoking cause for the DVT. He was treated with Xarelto for 8 months and transitioned to Aspirin after that. He subsequently had recurrent RLE DVT in October of 2021 and was placed back on Xarelto indefinitely. 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 He reports that he has swelling in the right leg and also daily aching and heaviness in the right greater than left leg. He does not regularly elevate and was instructed to wear compression stockings.   No new complaints, no SOB or CP.  Allergies  Allergen Reactions  . Effexor Xr [Venlafaxine Hcl Er] Other (See Comments)    Insomnia  . Acyclovir And Related   . Zocor [Simvastatin] Other (See Comments)    Muscle ache/ pain  . Dexilant [Dexlansoprazole] Other (See Comments)    Muscle aches  . Ivp Dye [Iodinated Diagnostic Agents] Rash  . Soma [Carisoprodol] Other (See Comments)    Sores on arm  . Sulfa Antibiotics Rash    Current Outpatient Medications  Medication Sig Dispense Refill  . azelastine (ASTELIN) 0.1 % nasal spray Place 2 sprays into both nostrils 2 (two) times daily. 30 mL 12  . desonide (DESOWEN) 0.05 % lotion APPLY EXTERNALLY TO THE AFFECTED AREA EVERY DAY AS NEEDED 118 mL 2  . diclofenac (VOLTAREN) 75 MG EC tablet Take 1 tablet (75 mg total) by mouth 2 (two) times daily. 30 tablet 0  . Elastic Bandages & Supports (MEDICAL COMPRESSION STOCKINGS) MISC Use daily. 2 each 0  . fluticasone (FLONASE) 50 MCG/ACT nasal spray INSTILL 2 SPRAYS IN EACH NOSTRIL EVERY DAY AS NEEDED    . hyoscyamine (LEVSIN SL) 0.125 MG SL tablet DISSOLVE 1 TABLET(0.125 MG) UNDER THE TONGUE EVERY 6 HOURS AS NEEDED 120 tablet 3  . LORazepam (ATIVAN) 1 MG tablet Take 0.5-1 tablets (0.5-1 mg total) by mouth  every 8 (eight) hours as needed for anxiety. 90 tablet 0  . omeprazole (PRILOSEC) 20 MG capsule TAKE 1 CAPSULE BY MOUTH EVERY DAY AS NEEDED    . predniSONE (DELTASONE) 50 MG tablet Take 1 tablet daily for 5 days. 5 tablet 0  . rivaroxaban (XARELTO) 20 MG TABS tablet Take 1 tablet (20 mg total) by mouth daily with supper. Start 20 mg daily tablets after completion of 3 weeks of 15 mg BID dosing. 90 tablet 1  . azithromycin (ZITHROMAX) 250 MG tablet Take 2 tabs day 1, then 1 tab daily (Patient not taking: Reported on 08/17/2020) 6 each 0   No current facility-administered medications for this visit.     ROS:  See HPI  Physical Exam:    Vascular Exam/Pulses:2 + radial pulses bilaterally, 2+ femoral, 2+ Dp and PT pulses bilaterally Extremities: without varicose veins, with reticular veins, with mild edema of right greater than left distal leg and ankle, without stasis pigmentation, without lipodermatosclerosis, without ulcers  Pulmonary: normal non-labored breathing , without wheezing Cardiac: regular HR, without  Murmurs without carotid bruit    Assessment/Plan:  This is a 70 y.o. 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  There is no reflux reported at the [facility], + left  popliteal reflux and mid thigh GSV There is no SFJ reflux on the right, positive CFV, Fem vein deep system.    RIGHT:  - There is recanalized thrombus in the right femoral vein(s).  - Findings consistent with chronic deep vein thrombosis involving the  right femoral vein.  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  - No cystic structure found in the popliteal fossa.    LEFT:  - There is no evidence of deep vein thrombosis in the lower extremity.  - There is no evidence of superficial venous thrombosis.    20-30 mm thigh high compression stockings, elevation when at rest, avoid prolonged standing and sitting..   Exercise program.     Roxy Horseman PA-C [facility] [phone]    MD on call [facility]

## 2020-08-20 ENCOUNTER — Ambulatory Visit (INDEPENDENT_AMBULATORY_CARE_PROVIDER_SITE_OTHER): Payer: Medicare Other | Admitting: Family Medicine

## 2020-08-20 ENCOUNTER — Encounter: Payer: Self-pay | Admitting: Family Medicine

## 2020-08-20 VITALS — BP 111/76 | HR 69 | Temp 98.6°F | Ht 72.0 in | Wt 166.8 lb

## 2020-08-20 DIAGNOSIS — I824Z1 Acute embolism and thrombosis of unspecified deep veins of right distal lower extremity: Secondary | ICD-10-CM | POA: Diagnosis not present

## 2020-08-20 DIAGNOSIS — Z86711 Personal history of pulmonary embolism: Secondary | ICD-10-CM

## 2020-08-20 DIAGNOSIS — R739 Hyperglycemia, unspecified: Secondary | ICD-10-CM

## 2020-08-20 DIAGNOSIS — J309 Allergic rhinitis, unspecified: Secondary | ICD-10-CM

## 2020-08-20 DIAGNOSIS — K589 Irritable bowel syndrome without diarrhea: Secondary | ICD-10-CM | POA: Diagnosis not present

## 2020-08-20 DIAGNOSIS — R197 Diarrhea, unspecified: Secondary | ICD-10-CM | POA: Diagnosis not present

## 2020-08-20 DIAGNOSIS — M5136 Other intervertebral disc degeneration, lumbar region: Secondary | ICD-10-CM

## 2020-08-20 DIAGNOSIS — Z13220 Encounter for screening for lipoid disorders: Secondary | ICD-10-CM

## 2020-08-20 LAB — COMPREHENSIVE METABOLIC PANEL
ALT: 19 U/L (ref 0–53)
AST: 18 U/L (ref 0–37)
Albumin: 4.1 g/dL (ref 3.5–5.2)
Alkaline Phosphatase: 68 U/L (ref 39–117)
BUN: 17 mg/dL (ref 6–23)
CO2: 28 mEq/L (ref 19–32)
Calcium: 9.1 mg/dL (ref 8.4–10.5)
Chloride: 105 mEq/L (ref 96–112)
Creatinine, Ser: 0.91 mg/dL (ref 0.40–1.50)
GFR: 86.05 mL/min (ref >60.00)
Glucose, Bld: 87 mg/dL (ref 70–99)
Potassium: 4.2 mEq/L (ref 3.5–5.1)
Sodium: 141 mEq/L (ref 135–145)
Total Bilirubin: 0.9 mg/dL (ref 0.2–1.2)
Total Protein: 6.4 g/dL (ref 6.0–8.3)

## 2020-08-20 LAB — LIPID PANEL
Cholesterol: 243 mg/dL — ABNORMAL HIGH (ref 0–200)
HDL: 59.8 mg/dL (ref >39.00)
LDL Cholesterol: 152 mg/dL — ABNORMAL HIGH (ref 0–99)
NonHDL: 183.29
Total CHOL/HDL Ratio: 4
Triglycerides: 156 mg/dL — ABNORMAL HIGH (ref 0.0–149.0)
VLDL: 31.2 mg/dL (ref 0.0–40.0)

## 2020-08-20 LAB — CBC
HCT: 47 % (ref 39.0–52.0)
Hemoglobin: 15.9 g/dL (ref 13.0–17.0)
MCHC: 33.9 g/dL (ref 30.0–36.0)
MCV: 92.4 fl (ref 78.0–100.0)
Platelets: 181 10*3/uL (ref 150.0–400.0)
RBC: 5.08 Mil/uL (ref 4.22–5.81)
RDW: 13.9 % (ref 11.5–15.5)
WBC: 6.3 10*3/uL (ref 4.0–10.5)

## 2020-08-20 LAB — TSH: TSH: 3.67 u[IU]/mL (ref 0.35–4.50)

## 2020-08-20 LAB — HEMOGLOBIN A1C: Hgb A1c MFr Bld: 5.6 % (ref 4.6–6.5)

## 2020-08-20 MED ORDER — KETOROLAC TROMETHAMINE 60 MG/2ML IM SOLN
60.0000 mg | Freq: Once | INTRAMUSCULAR | Status: AC
  Administered 2020-08-20: 60 mg via INTRAMUSCULAR

## 2020-08-20 MED ORDER — HYOSCYAMINE SULFATE 0.125 MG SL SUBL: 0.2500 mg | SUBLINGUAL_TABLET | Freq: Four times a day (QID) | SUBLINGUAL | 3 refills | Status: DC | PRN

## 2020-08-20 MED ORDER — METHYLPREDNISOLONE ACETATE 80 MG/ML IJ SUSP
80.0000 mg | Freq: Once | INTRAMUSCULAR | Status: AC
  Administered 2020-08-20: 80 mg via INTRAMUSCULAR

## 2020-08-20 MED ORDER — FLUTICASONE PROPIONATE 50 MCG/ACT NA SUSP: 2.0000 | Freq: Every day | NASAL | 6 refills | Status: DC

## 2020-08-20 NOTE — Progress Notes (Signed)
   David Armstrong is a 70 y.o. male who presents today for an office visit.  Assessment/Plan:  New/Acute Problems: Sore throat Possibly due to rhinitis.  Also has GERD which could be contributing.  Due to persistent nature he will be following up with ENT  Chronic Problems Addressed Today: History of pulmonary embolism and DVT (Oct 2017) On lifelong anticoagulation with Xarelto 20 mg daily.  Recent ultrasound showed chronic right femoral DVT.  IBS (irritable bowel syndrome) We will increase hyoscamine to 250 mcg every 6 hours as needed.  Advised to follow-up with GI soon.  Allergic rhinitis Will refill Flonase.  Could be contributing to his sore throat though asked him to follow-up with ENT soon.  Lumbar degenerative disc disease Worsened since his last visit.  We will give 60 mg of Toradol and 80 mg of Depo-Medrol today per patient request.  No red flags today.  If continues to be an issue would consider referral back to sports medicine.  Preventive healthcare We will check labs today including CBC, c-Met, TSH, A1c, and lipid panel.    Subjective:  HPI:  See A/P for status of chronic conditions  He has had worsening episodes of diarrhea.  He has also had persistent sore throat for the past several months.  We treated him with azithromycin and prednisone about a month ago symptoms improved for a few days and then returned.        Objective:  Physical Exam: BP 111/76   Pulse 69   Temp 98.6 F (37 C) (Temporal)   Ht 6' (1.829 m)   Wt 166 lb 12.8 oz (75.7 kg)   SpO2 99%   BMI 22.62 kg/m   Gen: No acute distress, resting comfortably Neuro: Grossly normal, moves all extremities Psych: Normal affect and thought content  Time Spent: 45 minutes of total time was spent on the date of the encounter performing the following actions: chart review prior to seeing the patient including recent visit with vascular surgery., obtaining history, performing a medically necessary  exam, counseling on the treatment plan, placing orders, and documenting in our EHR.        [name]. [name], MD 08/20/2020 11:58 AM

## 2020-08-20 NOTE — Assessment & Plan Note (Signed)
Will refill Flonase.  Could be contributing to his sore throat though asked him to follow-up with ENT soon.

## 2020-08-20 NOTE — Assessment & Plan Note (Signed)
We will increase hyoscamine to 250 mcg every 6 hours as needed.  Advised to follow-up with GI soon.

## 2020-08-20 NOTE — Assessment & Plan Note (Signed)
Worsened since his last visit.  We will give 60 mg of Toradol and 80 mg of Depo-Medrol today per patient request.  No red flags today.  If continues to be an issue would consider referral back to sports medicine.

## 2020-08-20 NOTE — Patient Instructions (Signed)
It was very nice to see you today!  We will give you the anti-inflammatory injections today.  Please try increasing the dose of hyoscamine and let me know how it goes.   We will check blood work today.  Please see your ENT doctor soon.  Take care, Dr [name]  PLEASE NOTE:  If you had any lab tests please let us know if you have not heard back within a few days. You may see your results on mychart before we have a chance to review them but we will give you a call once they are reviewed by [place]. If we ordered any referrals today, please let us know if you have not heard from their office within the next week.   Please try these tips to maintain a healthy lifestyle:   Eat at least 3 REAL meals and 1-2 snacks per day.  Aim for no more than 5 hours between eating.  If you eat breakfast, please do so within one hour of getting up.    Each meal should contain half fruits/vegetables, one quarter protein, and one quarter carbs (no bigger than a computer mouse)   Cut down on sweet beverages. This includes juice, soda, and sweet tea.     Drink at least 1 glass of water with each meal and aim for at least 8 glasses per day   Exercise at least 150 minutes every week.

## 2020-08-20 NOTE — Assessment & Plan Note (Signed)
On lifelong anticoagulation with Xarelto 20 mg daily.  Recent ultrasound showed chronic right femoral DVT.

## 2020-08-21 NOTE — Progress Notes (Signed)
Please inform patient of the following:  Cholesterol levels are elevated but everything else is stable.  Recommend starting Lipitor 40 mg daily to improve his cholesterol numbers and lower risk of heart attack and stroke.  He should continue working on diet and exercise.  We can recheck in year.

## 2020-08-24 ENCOUNTER — Other Ambulatory Visit: Payer: Self-pay | Admitting: *Deleted

## 2020-08-24 MED ORDER — LOVASTATIN 20 MG PO TABS: 20.0000 mg | ORAL_TABLET | Freq: Every day | ORAL | 1 refills | Status: DC

## 2020-08-24 NOTE — Progress Notes (Signed)
We can send in simvastatin 20mg  daily.  [name]. [name], MD 08/24/2020 2:48 PM

## 2020-08-29 ENCOUNTER — Telehealth: Payer: Self-pay

## 2020-08-29 NOTE — Telephone Encounter (Signed)
Patient called in stating he picked up his prescription lovastatin (MEVACOR) 20 MG tablet and started to have throat swelling and hives. Sent patient to triage, team health called back and states they recommended ED but he is refusing wants to come to the office for a shot. Waiting on triage note.

## 2020-08-29 NOTE — Telephone Encounter (Signed)
See note

## 2020-08-29 NOTE — Telephone Encounter (Signed)
Patient was send to triage waiting on Triage note

## 2020-08-29 NOTE — Telephone Encounter (Signed)
Nurse Assessment Nurse: Velta Addison, RN, [place] Date/Time (Eastern Time): 08/29/2020 1:31:53 PM Confirm and document reason for call. If symptomatic, describe symptoms. ---caller states that he was prescribed a statin medication. after taking medication last night for first time he is having sore throat and difficulty swallowing. he has been sneezing too but that may be unrelated. he was exosed to covid on mothers day and he did test negative for covid. Caller states it is painful to swallow. Caller states he had food get stuck about a week ago and had to vomit it back up. Caller states his throat feels tight and sore. Started feeling scratchy throat after taking Lovastatin. Does the patient have any new or worsening symptoms? ---Yes Will a triage be completed? ---Yes Related visit to physician within the last 2 weeks? ---Yes Does the PT have any chronic conditions? (i.e. diabetes, asthma, this includes High risk factors for pregnancy, etc.) ---Yes List chronic conditions. ---Cholesterol, neuropathy Is this a behavioral health or substance abuse call? ---No PLEASE NOTE: All timestamps contained within this report are represented as [place] Standard Time. CONFIDENTIALTY NOTICE: This fax transmission is intended only for the addressee. It contains information that is legally privileged, confidential or otherwise protected from use or disclosure. If you are not the intended recipient, you are strictly prohibited from reviewing, disclosing, copying using or disseminating any of this information or taking any action in reliance on or regarding this information. If you have received this fax in error, please notify us immediately by telephone so that we can arrange for its return to [place]. Phone: [phone], Toll-Free: [phone], Fax: [phone] Page: 2 of 2 Call Id: [id number] Guidelines Guideline Title Affirmed Question Affirmed Notes Nurse Date/Time  Eilene Ghazi Time) Swallowing Difficulty Patient sounds very sick or weak to the triager Velta Addison, RN, [place] 08/29/2020 1:37:49 PM Disp. Time Eilene Ghazi Time) Disposition Final User 08/29/2020 1:40:10 PM Go to ED Now (or PCP triage) Yes Velta Addison, RN, Dwight Disagree/Comply Disagree Caller Understands Yes PreDisposition Call Doctor Care Advice Given Per Guideline GO TO ED NOW (OR PCP TRIAGE): ANOTHER ADULT SHOULD DRIVE: * It is better and safer if another adult drives instead of you. CARE ADVICE given per Swallowed Difficulty (Adult) guideline. Comments User: Hamilton Capri, RN Date/Time Eilene Ghazi Time): 08/29/2020 1:48:39 PM Advised pt to go to ER or UC to be assessed. He could be having airway issues due to the medication, he could also have a stricture. States he ate breakfast and lunch but feels like he would not be able to swallow anything at this time. He is refusing to go to ER/UC due to Covid. I called office and he is awaiting a callback with their instructions. I have advised him to get Benadryl at this time. due to he does not have any family or anyone to check on him in case his airway gets worse. Verbalized understanding. Referrals GO TO FACILITY REFUSED

## 2020-08-30 ENCOUNTER — Ambulatory Visit: Payer: Medicare Other | Attending: Critical Care Medicine

## 2020-08-30 DIAGNOSIS — Z20822 Contact with and (suspected) exposure to covid-19: Secondary | ICD-10-CM | POA: Diagnosis not present

## 2020-08-30 NOTE — Telephone Encounter (Signed)
Spoke with patient stated still with sore throat, No temperature and denied having hives. Took statin two day ago and stated does not think is reaction to it since Sx still present. Has appointment for Covid test today. Will still hold on on stating.

## 2020-08-30 NOTE — Telephone Encounter (Signed)
Noted. He has an office visit for tomorrow - we can discuss then.  [name]. [name], MD 08/30/2020 12:22 PM

## 2020-08-30 NOTE — Telephone Encounter (Signed)
He should stop the statin. Please add to his allergy list.  Would like for him to let us know if symptoms are not improving after stopping the statin.  [name]. [name], MD 08/30/2020 8:14 AM

## 2020-08-31 ENCOUNTER — Telehealth (INDEPENDENT_AMBULATORY_CARE_PROVIDER_SITE_OTHER): Payer: Medicare Other | Admitting: Family Medicine

## 2020-08-31 ENCOUNTER — Encounter: Payer: Self-pay | Admitting: Family Medicine

## 2020-08-31 VITALS — Ht 72.0 in | Wt 166.0 lb

## 2020-08-31 DIAGNOSIS — J309 Allergic rhinitis, unspecified: Secondary | ICD-10-CM | POA: Diagnosis not present

## 2020-08-31 DIAGNOSIS — J029 Acute pharyngitis, unspecified: Secondary | ICD-10-CM | POA: Diagnosis not present

## 2020-08-31 LAB — NOVEL CORONAVIRUS, NAA: SARS-CoV-2, NAA: NOT DETECTED

## 2020-08-31 LAB — SARS-COV-2, NAA 2 DAY TAT

## 2020-08-31 MED ORDER — MOLNUPIRAVIR 200 MG PO CAPS: 800.0000 mg | ORAL_CAPSULE | Freq: Two times a day (BID) | ORAL | 0 refills | Status: AC

## 2020-08-31 NOTE — Assessment & Plan Note (Signed)
Worsened. Will add on astelin. Could be contributing to his sore throat.

## 2020-08-31 NOTE — Progress Notes (Signed)
   David Armstrong is a 70 y.o. male who presents today for a virtual office visit.  Assessment/Plan:  New/Acute Problems: Sore Throat Possibly viral URI though could be COVID as well.  His COVID test is pending.  Symptoms are currently mild though he is at moderately increased risk for progression to severe COVID.  He is not a candidate for paxlovid due to his medications.  We will send in a prescription for molnupirivir.  Instructed patient to start this if his COVID test comes back positive over the next couple of days.  Encourage oral hydration.  Discussed reasons to return to care.  Chronic Problems Addressed Today: No problem-specific Assessment & Plan notes found for this encounter.     Subjective:  HPI:  Patient here with a couple of days of sore throat, congestion, sneezing.  No known sick contacts though about a week and a half ago was around some family reports they were diagnosed with COVID.  He has not had any fevers or chills.  No specific treatments tried.  His COVID test is pending.       Objective/Observations  Physical Exam: Gen: NAD, resting comfortably Pulm: Normal work of breathing Neuro: Grossly normal, moves all extremities Psych: Normal affect and thought content  Virtual Visit via Video   I connected with David Armstrong on 08/31/20 at  3:40 PM EDT by a video enabled telemedicine application and verified that I am speaking with the correct person using two identifiers. The limitations of evaluation and management by telemedicine and the availability of in person appointments were discussed. The patient expressed understanding and agreed to proceed.   Patient location: Home Provider location: [place] participating in the virtual visit: Myself and Patient     [name]. [name], MD 08/31/2020 3:44 PM

## 2020-09-04 DIAGNOSIS — K146 Glossodynia: Secondary | ICD-10-CM | POA: Diagnosis not present

## 2020-09-04 DIAGNOSIS — K219 Gastro-esophageal reflux disease without esophagitis: Secondary | ICD-10-CM | POA: Diagnosis not present

## 2020-09-06 ENCOUNTER — Telehealth: Payer: Self-pay

## 2020-09-06 NOTE — Telephone Encounter (Signed)
Pt called stating he woke up this morning and his "chest was wet" he also states that "down below was kind of wet". Pt is not experiencing any pain, no labored breathing, and states he feels ok. Pt asked if CMA could give him a call to let him know if he needs to be seen or not. Please advise.

## 2020-09-06 NOTE — Telephone Encounter (Signed)
Patient stated his chest was sweating no pain no other symptoms  Advise to go to UC if symptoms persist

## 2020-10-01 DIAGNOSIS — M9903 Segmental and somatic dysfunction of lumbar region: Secondary | ICD-10-CM | POA: Diagnosis not present

## 2020-10-01 DIAGNOSIS — M9905 Segmental and somatic dysfunction of pelvic region: Secondary | ICD-10-CM | POA: Diagnosis not present

## 2020-10-01 DIAGNOSIS — M5137 Other intervertebral disc degeneration, lumbosacral region: Secondary | ICD-10-CM | POA: Diagnosis not present

## 2020-10-01 DIAGNOSIS — M9904 Segmental and somatic dysfunction of sacral region: Secondary | ICD-10-CM | POA: Diagnosis not present

## 2020-10-04 ENCOUNTER — Ambulatory Visit (INDEPENDENT_AMBULATORY_CARE_PROVIDER_SITE_OTHER): Payer: Medicare Other | Admitting: Physician Assistant

## 2020-10-04 ENCOUNTER — Other Ambulatory Visit: Payer: Self-pay

## 2020-10-04 VITALS — BP 110/78 | HR 82 | Temp 98.2°F | Ht 72.0 in | Wt 171.2 lb

## 2020-10-04 DIAGNOSIS — M1990 Unspecified osteoarthritis, unspecified site: Secondary | ICD-10-CM

## 2020-10-04 DIAGNOSIS — R21 Rash and other nonspecific skin eruption: Secondary | ICD-10-CM

## 2020-10-04 MED ORDER — KETOROLAC TROMETHAMINE 60 MG/2ML IM SOLN
60.0000 mg | Freq: Once | INTRAMUSCULAR | Status: AC
  Administered 2020-10-04: 60 mg via INTRAMUSCULAR

## 2020-10-04 MED ORDER — BETAMETHASONE DIPROPIONATE 0.05 % EX CREA: TOPICAL_CREAM | Freq: Two times a day (BID) | CUTANEOUS | 0 refills | Status: DC

## 2020-10-04 MED ORDER — METHYLPREDNISOLONE ACETATE 80 MG/ML IJ SUSP: 80.0000 mg | Freq: Once | INTRAMUSCULAR | 0 refills | Status: AC

## 2020-10-04 MED ORDER — METHYLPREDNISOLONE ACETATE 80 MG/ML IJ SUSP
80.0000 mg | Freq: Once | INTRAMUSCULAR | Status: AC
  Administered 2020-10-04: 80 mg via INTRAMUSCULAR

## 2020-10-04 NOTE — Patient Instructions (Signed)
Please use the cream twice daily on affected areas. Wash hands after use. Please call if worse or no improvement of rash or pain in your joints.

## 2020-10-04 NOTE — Progress Notes (Signed)
Acute Office Visit  Subjective:    Patient ID: David Armstrong, male    DOB: 01-Feb-1951, 70 y.o.   MRN: [id number]  Chief Complaint  Patient presents with   Rash    HPI Patient is in today for rash x 4 weeks. He states he first noticed a few spots on his right leg and now has some on his buttocks and back. He has some itching, but no pain with the rash. He tried Desonide cream with minimal relief. No new medications or soaps or detergents. Pt is worried about having a DVT because he read that you can get this rash with a DVT. Denies any pain, redness, or swelling in calves. No CP or SOB. He also complains of generalized arthritis pain and would like the "shots that he had before."   Past Medical History:  Diagnosis Date   Anxiety    Arthritis    "lower back; hips;' right knee"    Bipolar disorder ([place])    Chronic constipation    Depression    Diverticulosis of colon    Edema of both lower extremities    ANKLES   Fatty liver    GERD (gastroesophageal reflux disease)    Hiatal hernia    History of anal fissures    History of deep vein thrombosis (DVT) of lower extremity 02/11/2016   right lower extremity distal and proximal extensive acute   History of Helicobacter pylori infection 2012; 2009   History of kidney stones    History of pneumonia 01/2015   CAP   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    Hyperlipidemia    IBS (irritable bowel syndrome)    Left ureteral stone    Nocturia    Peripheral neuropathy    per pt due to back DDD   Personal history of colonic polyps    HYPERPLASTIC POLYP   Pulmonary nodule, right    incidental finding CT chest angio 02-11-2016 stable   TMJ (temporomandibular joint syndrome)    Varicosities of leg     Past Surgical History:  Procedure Laterality Date   BELPHAROPTOSIS REPAIR Bilateral 01/2015   CATARACT EXTRACTION W/ INTRAOCULAR LENS  IMPLANT, BILATERAL  04/2016   COLONOSCOPY WITH  ESOPHAGOGASTRODUODENOSCOPY (EGD)  last one 03-02-2013   CYSTOSCOPY/RETROGRADE/URETEROSCOPY/STONE EXTRACTION WITH BASKET  2010   CYSTOSCOPY/URETEROSCOPY/HOLMIUM LASER/STENT PLACEMENT Left 03/16/2017   Procedure: CYSTOSCOPY/RETROGRADE/URETEROSCOPY/HOLMIUM LASER/STENT PLACEMENT, STONE BASKET EXTRACTION AND STENT PLACEMENT;  Surgeon: [name], MD;  Location: [facility];  Service: Urology;  Laterality: Left;   EXCISIONAL HEMORRHOIDECTOMY  2015 approx.   EXTRACORPOREAL SHOCK WAVE LITHOTRIPSY  1987; 1992   FOOT NEUROMA SURGERY Bilateral left 09/2015; right 04/ 2017   morton' neuroma   FOOT NEUROMA SURGERY Right 2004   morton's neuroma and removal heel spur   HAMMER TOE SURGERY Left 2013   5th toe   INCISION AND DRAINAGE FOOT Left 1992   "ball of foot; stepped on piece of hard wire & it went thru my shoe"   KNEE ARTHROSCOPY Bilateral left x2 1997;  right 2004 & 2005   [place]   PATELLA-FEMORAL ARTHROPLASTY Right 07-06-2003   dr Eddie Dibbles [facility]   PLANTAR FASCIA RELEASE Bilateral 1987 & 1993   RHINOPLASTY  2000   SHOULDER ARTHROSCOPY Left 2012;  2013   SHOULDER ARTHROSCOPY WITH DISTAL CLAVICLE RESECTION Right 07-31-2005   dr Percell Miller  [facility]   w/ Debridement labral and adhesions, acromioplasty, bursectomy, and CA  ligament release   TRANSTHORACIC ECHOCARDIOGRAM  02/12/2016   ef 46-65%, grade 1 diastolic dysfunction/  trivial TR    Family History  Problem Relation Age of Onset   Heart disease Mother    Stroke Mother    Cirrhosis Father    Heart disease Father    Skin cancer Father    Prostate cancer Maternal Grandfather    Parkinson's disease Paternal Grandfather    Heart disease Brother    Colon cancer Neg Hx    Stomach cancer Neg Hx     Social History   Socioeconomic History   Marital status: Divorced    Spouse name: Not on file   Number of children: 0   Years of education: Not on file   Highest education level: Not on file  Occupational History    Occupation: retired    Fish farm manager: [employer]    Comment: disabled now  Tobacco Use   Smoking status: Never   Smokeless tobacco: Never  Vaping Use   Vaping Use: Never used  Substance and Sexual Activity   Alcohol use: No    Alcohol/week: 0.0 standard drinks   Drug use: No   Sexual activity: Never  Other Topics Concern   Not on file  Social History Narrative   Goes by "[name]"   Divorced   No children   Retired from the post office   Has internet girlfriend on facebook from [place] [employer] Strain: Low Risk    Difficulty of Paying Living Expenses: Not hard at all  Food Insecurity: No Food Insecurity   Worried About Charity fundraiser in the Last Year: Never true   Arboriculturist in the Last Year: Never true  Transportation Needs: No Transportation Needs   Lack of Transportation (Medical): No   Lack of Transportation (Non-Medical): No  Physical Activity: Sufficiently Active   Days of Exercise per Week: 5 days   Minutes of Exercise per Session: 40 min  Stress: Stress Concern Present   Feeling of Stress : Rather much  Social Connections: Socially Isolated   Frequency of Communication with Friends and Family: Never   Frequency of Social Gatherings with Friends and Family: Never   Attends Religious Services: Never   Marine scientist or Organizations: No   Attends Music therapist: Never   Marital Status: Divorced  Human resources officer Violence: Not At Risk   Fear of Current or Ex-Partner: No   Emotionally Abused: No   Physically Abused: No   Sexually Abused: No    Outpatient Medications Prior to Visit  Medication Sig Dispense Refill   desonide (DESOWEN) 0.05 % lotion APPLY EXTERNALLY TO THE AFFECTED AREA EVERY DAY AS NEEDED 118 mL 2   diclofenac (VOLTAREN) 75 MG EC tablet Take 1 tablet (75 mg total) by mouth 2 (two) times daily. 30 tablet 0   Elastic Bandages & Supports (MEDICAL COMPRESSION STOCKINGS) MISC Use  daily. 2 each 0   fluticasone (FLONASE) 50 MCG/ACT nasal spray Place 2 sprays into both nostrils daily. 16 g 6   hyoscyamine (LEVSIN SL) 0.125 MG SL tablet Take 2 tablets (0.25 mg total) by mouth every 6 (six) hours as needed. DISSOLVE 1 TABLET(0.125 MG) UNDER THE TONGUE EVERY 6 HOURS AS NEEDED 120 tablet 3   LORazepam (ATIVAN) 1 MG tablet Take 0.5-1 tablets (0.5-1 mg total) by mouth every 8 (eight) hours as needed for anxiety. 90 tablet 0  omeprazole (PRILOSEC) 20 MG capsule TAKE 1 CAPSULE BY MOUTH EVERY DAY AS NEEDED     rivaroxaban (XARELTO) 20 MG TABS tablet Take 1 tablet (20 mg total) by mouth daily with supper. Start 20 mg daily tablets after completion of 3 weeks of 15 mg BID dosing. 90 tablet 1   lovastatin (MEVACOR) 20 MG tablet Take 1 tablet (20 mg total) by mouth at bedtime. (Patient not taking: Reported on 10/04/2020) 30 tablet 1   No facility-administered medications prior to visit.    Allergies  Allergen Reactions   Effexor Xr [Venlafaxine Hcl Er] Other (See Comments)    Insomnia   Acyclovir And Related    Zocor [Simvastatin] Other (See Comments)    Muscle ache/ pain   Dexilant [Dexlansoprazole] Other (See Comments)    Muscle aches   Ivp Dye [Iodinated Diagnostic Agents] Rash   Soma [Carisoprodol] Other (See Comments)    Sores on arm   Sulfa Antibiotics Rash    Review of Systems REFER TO HPI FOR PERTINENT POSITIVES AND NEGATIVES     Objective:    Physical Exam Vitals and nursing note reviewed.  Constitutional:      General: He is not in acute distress.    Appearance: He is normal weight. He is not ill-appearing, toxic-appearing or diaphoretic.  Skin:    Comments: Small oval discrete scattered patches of dryness and scaling on an erythematous base on right leg, left leg, buttocks, some on back as well. No vesicles. No pustules.   Neurological:     Mental Status: He is alert.    BP 110/78   Pulse 82   Temp 98.2 F (36.8 C)   Ht 6' (1.829 m)   Wt 171 lb 3.2  oz (77.7 kg)   SpO2 97%   BMI 23.22 kg/m  Wt Readings from Last 3 Encounters:  10/04/20 171 lb 3.2 oz (77.7 kg)  08/31/20 166 lb (75.3 kg)  08/20/20 166 lb 12.8 oz (75.7 kg)    There are no preventive care reminders to display for this patient.  There are no preventive care reminders to display for this patient.   Lab Results  Component Value Date   TSH 3.67 08/20/2020   Lab Results  Component Value Date   WBC 6.3 08/20/2020   HGB 15.9 08/20/2020   HCT 47.0 08/20/2020   MCV 92.4 08/20/2020   PLT 181.0 08/20/2020   Lab Results  Component Value Date   NA 141 08/20/2020   K 4.2 08/20/2020   CO2 28 08/20/2020   GLUCOSE 87 08/20/2020   BUN 17 08/20/2020   CREATININE 0.91 08/20/2020   BILITOT 0.9 08/20/2020   ALKPHOS 68 08/20/2020   AST 18 08/20/2020   ALT 19 08/20/2020   PROT 6.4 08/20/2020   ALBUMIN 4.1 08/20/2020   CALCIUM 9.1 08/20/2020   ANIONGAP 6 09/23/2019   GFR 86.05 08/20/2020   Lab Results  Component Value Date   CHOL 243 (H) 08/20/2020   Lab Results  Component Value Date   HDL 59.80 08/20/2020   Lab Results  Component Value Date   LDLCALC 152 (H) 08/20/2020   Lab Results  Component Value Date   TRIG 156.0 (H) 08/20/2020   Lab Results  Component Value Date   CHOLHDL 4 08/20/2020   Lab Results  Component Value Date   HGBA1C 5.6 08/20/2020       Assessment & Plan:   Problem List Items Addressed This Visit   None  1. Rash and nonspecific  skin eruption Dr. [name] participated in evaluation. Most likely type of dermatitis. Reassured patient that he does not have any signs of DVT today. Will try Betamethasone cream as a thin layer twice daily. Recheck in the next few weeks if worse or no improvement.  2. Osteoarthritis, unspecified osteoarthritis type, unspecified site His typical pain, new issues. My MA gave Toradol 60 mg IM and Depomedrol 80 mg IM in office today, as he has done well with this previously. F/up with Dr. [name] if still  bothersome.    Miyana Mordecai M Jakel Alphin, PA-C

## 2020-10-10 ENCOUNTER — Other Ambulatory Visit: Payer: Self-pay

## 2020-10-10 ENCOUNTER — Other Ambulatory Visit (INDEPENDENT_AMBULATORY_CARE_PROVIDER_SITE_OTHER): Payer: Medicare Other

## 2020-10-10 ENCOUNTER — Telehealth (INDEPENDENT_AMBULATORY_CARE_PROVIDER_SITE_OTHER): Payer: Medicare Other | Admitting: Family Medicine

## 2020-10-10 ENCOUNTER — Encounter: Payer: Self-pay | Admitting: Family Medicine

## 2020-10-10 VITALS — Ht 72.0 in | Wt 170.0 lb

## 2020-10-10 DIAGNOSIS — K219 Gastro-esophageal reflux disease without esophagitis: Secondary | ICD-10-CM | POA: Diagnosis not present

## 2020-10-10 DIAGNOSIS — R600 Localized edema: Secondary | ICD-10-CM

## 2020-10-10 DIAGNOSIS — I824Z1 Acute embolism and thrombosis of unspecified deep veins of right distal lower extremity: Secondary | ICD-10-CM | POA: Diagnosis not present

## 2020-10-10 DIAGNOSIS — R519 Headache, unspecified: Secondary | ICD-10-CM | POA: Diagnosis not present

## 2020-10-10 MED ORDER — BETAMETHASONE DIPROPIONATE 0.05 % EX CREA: TOPICAL_CREAM | Freq: Two times a day (BID) | CUTANEOUS | 2 refills | Status: DC

## 2020-10-10 NOTE — Assessment & Plan Note (Signed)
Has had a few missed doses of Xarelto.  We will recheck ultrasound to rule out recurrence of DVT.

## 2020-10-10 NOTE — Assessment & Plan Note (Signed)
Stable on omeprazole 20 mg daily.

## 2020-10-10 NOTE — Assessment & Plan Note (Signed)
We will check ultrasound as above to rule out DVT.  May consider diuretic therapy if his skin is normal and he continues to have issues with significant edema.  He will come in to check labs including CBC, c-Met, and TSH.

## 2020-10-10 NOTE — Assessment & Plan Note (Signed)
Follows with neurology.  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  Seeing them later this week.  No red flag signs or symptoms.

## 2020-10-10 NOTE — Progress Notes (Signed)
   David Armstrong is a 70 y.o. male who presents today for a virtual office visit.  Assessment/Plan:  New/Acute Problems: Rash Will continue betamethasone for another week or so.  Refill sent in today.  If does not resolve by then he will need to come in for biopsy  Chronic Problems Addressed Today: Headache Follows with neurology.  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  Seeing them later this week.  No red flag signs or symptoms.  DVT (deep venous thrombosis) ([place]) Has had a few missed doses of Xarelto.  We will recheck ultrasound to rule out recurrence of DVT.  Leg edema We will check ultrasound as above to rule out DVT.  May consider diuretic therapy if his skin is normal and he continues to have issues with significant edema.  He will come in to check labs including CBC, c-Met, and TSH.  GERD Stable on omeprazole 20 mg daily.     Subjective:  HPI:  Patient here for follow-up.  Saw a different provider in this office about a week ago with lower extremity swelling and rash.  Concern for dermatitis.  Started on betamethasone.  He is not sure if this is helped at all.  He has had continued issues with swelling in his legs as well.  He is concerned about recurrence of DVT.  He has been on Xarelto 20 mg daily though has missed a few doses here and there.  No reported chest pain or shortness of breath.  Is had some issues with headache.  Be following up with his neurologist later this week.        Objective/Observations  Physical Exam: Gen: NAD, resting comfortably Pulm: Normal work of breathing Neuro: Grossly normal, moves all extremities Psych: Normal affect and thought content Skin: Erythematous patches approximately 1 to 2 cm in diameter on lower extremities.  Virtual Visit via Video   I connected with David Armstrong on 10/10/20 at 11:20 AM EDT by a video enabled telemedicine application and verified that I am speaking with the correct person using two identifiers. The limitations of  evaluation and management by telemedicine and the availability of in person appointments were discussed. The patient expressed understanding and agreed to proceed.   Patient location: Home Provider location: [facility] Persons participating in the virtual visit: Myself and Patient  Time Spent: 45 minutes of total time was spent on the date of the encounter performing the following actions: chart review prior to seeing the patient, obtaining history, performing a medically necessary exam, counseling on the treatment plan including diagnostic testing, placing orders, and documenting in our EHR.       [name]. [name], MD 10/10/2020 12:17 PM

## 2020-10-11 ENCOUNTER — Inpatient Hospital Stay (HOSPITAL_COMMUNITY): Admission: RE | Admit: 2020-10-11 | Payer: Medicare Other | Source: Ambulatory Visit

## 2020-10-11 DIAGNOSIS — M545 Low back pain, unspecified: Secondary | ICD-10-CM | POA: Diagnosis not present

## 2020-10-11 DIAGNOSIS — R519 Headache, unspecified: Secondary | ICD-10-CM | POA: Diagnosis not present

## 2020-10-11 DIAGNOSIS — R252 Cramp and spasm: Secondary | ICD-10-CM | POA: Diagnosis not present

## 2020-10-11 DIAGNOSIS — G5603 Carpal tunnel syndrome, bilateral upper limbs: Secondary | ICD-10-CM | POA: Diagnosis not present

## 2020-10-11 DIAGNOSIS — M542 Cervicalgia: Secondary | ICD-10-CM | POA: Diagnosis not present

## 2020-10-11 LAB — COMPREHENSIVE METABOLIC PANEL
ALT: 14 U/L (ref 0–53)
AST: 18 U/L (ref 0–37)
Albumin: 3.8 g/dL (ref 3.5–5.2)
Alkaline Phosphatase: 72 U/L (ref 39–117)
BUN: 16 mg/dL (ref 6–23)
CO2: 24 mEq/L (ref 19–32)
Calcium: 8.8 mg/dL (ref 8.4–10.5)
Chloride: 107 mEq/L (ref 96–112)
Creatinine, Ser: 0.9 mg/dL (ref 0.40–1.50)
GFR: 87.11 mL/min (ref >60.00)
Glucose, Bld: 129 mg/dL — ABNORMAL HIGH (ref 70–99)
Potassium: 3.5 mEq/L (ref 3.5–5.1)
Sodium: 139 mEq/L (ref 135–145)
Total Bilirubin: 0.6 mg/dL (ref 0.2–1.2)
Total Protein: 6.2 g/dL (ref 6.0–8.3)

## 2020-10-11 LAB — CBC WITH DIFFERENTIAL/PLATELET
Basophils Absolute: 0.1 10*3/uL (ref 0.0–0.1)
Basophils Relative: 1 % (ref 0.0–3.0)
Eosinophils Absolute: 0.2 10*3/uL (ref 0.0–0.7)
Eosinophils Relative: 4.1 % (ref 0.0–5.0)
HCT: 39.7 % (ref 39.0–52.0)
Hemoglobin: 13.7 g/dL (ref 13.0–17.0)
Lymphocytes Relative: 20.1 % (ref 12.0–46.0)
Lymphs Abs: 1 10*3/uL (ref 0.7–4.0)
MCHC: 34.5 g/dL (ref 30.0–36.0)
MCV: 90.3 fl (ref 78.0–100.0)
Monocytes Absolute: 0.6 10*3/uL (ref 0.1–1.0)
Monocytes Relative: 12.3 % — ABNORMAL HIGH (ref 3.0–12.0)
Neutro Abs: 3.2 10*3/uL (ref 1.4–7.7)
Neutrophils Relative %: 62.5 % (ref 43.0–77.0)
Platelets: 197 10*3/uL (ref 150.0–400.0)
RBC: 4.4 Mil/uL (ref 4.22–5.81)
RDW: 14.1 % (ref 11.5–15.5)
WBC: 5.2 10*3/uL (ref 4.0–10.5)

## 2020-10-11 LAB — TSH: TSH: 2.61 u[IU]/mL (ref 0.35–5.50)

## 2020-10-12 ENCOUNTER — Ambulatory Visit (HOSPITAL_COMMUNITY)
Admission: RE | Admit: 2020-10-12 | Discharge: 2020-10-12 | Disposition: A | Discharge status: Home / Self Care | Payer: Medicare Other | Source: Ambulatory Visit | Attending: Cardiovascular Disease | Admitting: Cardiovascular Disease

## 2020-10-12 ENCOUNTER — Telehealth: Payer: Self-pay

## 2020-10-12 ENCOUNTER — Other Ambulatory Visit: Payer: Self-pay

## 2020-10-12 DIAGNOSIS — R600 Localized edema: Secondary | ICD-10-CM | POA: Diagnosis not present

## 2020-10-12 NOTE — Telephone Encounter (Signed)
Spoke to pt told him new Rx was sent in on 6/29 for Betamethasone cream with 2 refills. Pt said pharmacy said too early for insurance. Told pt not much we can do. I told pt to call pharmacy and let them know new Rx was sent in 6/29 to see if they will fill that one.If not can check on good Rx for coupon. Pt verbalized understanding.

## 2020-10-12 NOTE — Telephone Encounter (Signed)
Received a call from patient, he came the other week for an appointment -prescribed some rash cream. Insurance will not pay for the the second one due to it being to early. David Armstrong is wanting advice on what he should do in the meantime and will be out tomorrow.

## 2020-10-12 NOTE — Progress Notes (Signed)
Please inform patient of the following:  Ultrasound negative for DVT.  [name]. [name], MD 10/12/2020 5:42 PM

## 2020-10-16 NOTE — Progress Notes (Signed)
Please inform patient of the following:  Blood work is NORMAL.  [name]. [name], MD 10/16/2020 8:39 AM

## 2020-11-08 DIAGNOSIS — N50812 Left testicular pain: Secondary | ICD-10-CM | POA: Diagnosis not present

## 2020-11-13 ENCOUNTER — Ambulatory Visit: Payer: Medicare Other | Admitting: Family Medicine

## 2020-11-13 NOTE — Progress Notes (Incomplete)
   David Armstrong is a 70 y.o. male who presents today for an office visit.  Assessment/Plan:  New/Acute Problems: ***  Chronic Problems Addressed Today: No problem-specific Assessment & Plan notes found for this encounter.     Subjective:  HPI: Patient here for follow up.        Objective:  Physical Exam: There were no vitals taken for this visit.  Gen: No acute distress, resting comfortably*** CV: Regular rate and rhythm with no murmurs appreciated Pulm: Normal work of breathing, clear to auscultation bilaterally with no crackles, wheezes, or rhonchi Neuro: Grossly normal, moves all extremities Psych: Normal affect and thought content       I,Savera Zaman,acting as a scribe for [name], MD.,have documented all relevant documentation on the behalf of [name], MD,as directed by  [name], MD while in the presence of [name], MD.  *** [name]. [name], MD 11/13/2020 7:35 AM

## 2020-11-19 ENCOUNTER — Ambulatory Visit (INDEPENDENT_AMBULATORY_CARE_PROVIDER_SITE_OTHER): Payer: Medicare Other

## 2020-11-19 DIAGNOSIS — L814 Other melanin hyperpigmentation: Secondary | ICD-10-CM | POA: Diagnosis not present

## 2020-11-19 DIAGNOSIS — L821 Other seborrheic keratosis: Secondary | ICD-10-CM | POA: Diagnosis not present

## 2020-11-19 DIAGNOSIS — L728 Other follicular cysts of the skin and subcutaneous tissue: Secondary | ICD-10-CM | POA: Diagnosis not present

## 2020-11-19 DIAGNOSIS — Z0000 Encounter for general adult medical examination without abnormal findings: Secondary | ICD-10-CM | POA: Diagnosis not present

## 2020-11-19 DIAGNOSIS — D1801 Hemangioma of skin and subcutaneous tissue: Secondary | ICD-10-CM | POA: Diagnosis not present

## 2020-11-19 DIAGNOSIS — D229 Melanocytic nevi, unspecified: Secondary | ICD-10-CM | POA: Diagnosis not present

## 2020-11-19 DIAGNOSIS — L538 Other specified erythematous conditions: Secondary | ICD-10-CM | POA: Diagnosis not present

## 2020-11-19 DIAGNOSIS — L570 Actinic keratosis: Secondary | ICD-10-CM | POA: Diagnosis not present

## 2020-11-19 DIAGNOSIS — L309 Dermatitis, unspecified: Secondary | ICD-10-CM | POA: Diagnosis not present

## 2020-11-19 DIAGNOSIS — L819 Disorder of pigmentation, unspecified: Secondary | ICD-10-CM | POA: Diagnosis not present

## 2020-11-19 NOTE — Patient Instructions (Signed)
David Armstrong , Thank you for taking time to come for your Medicare Wellness Visit. I appreciate your ongoing commitment to your health goals. Please review the following plan we discussed and let me know if I can assist you in the future.   Screening recommendations/referrals: Colonoscopy: Done 03/02/13 repeat in 10 years 03/03/23 Recommended yearly ophthalmology/optometry visit for glaucoma screening and checkup Recommended yearly dental visit for hygiene and checkup  Vaccinations: Influenza vaccine: Due after 11/29/20 Pneumococcal vaccine: Completed  Tdap vaccine: Done 06/04/16 repeat in 10 years 06/04/26 Shingles vaccine: Completed 4/1, & 09/12/16 Covid-19: Competed 3/3, 07/13/19 &  07/31/20  Advanced directives: Advance directive discussed with you today. I have provided a copy for you to complete at home and have notarized. Once this is complete please bring a copy in to our office so we can scan it into your chart.  Conditions/risks identified: none at this time  Next appointment: Follow up in one year for your annual wellness visit.   Preventive Care 37 Years and Older, Male Preventive care refers to lifestyle choices and visits with your health care provider that can promote health and wellness. What does preventive care include? A yearly physical exam. This is also called an annual well check. Dental exams once or twice a year. Routine eye exams. Ask your health care provider how often you should have your eyes checked. Personal lifestyle choices, including: Daily care of your teeth and gums. Regular physical activity. Eating a healthy diet. Avoiding tobacco and drug use. Limiting alcohol use. Practicing safe sex. Taking low doses of aspirin every day. Taking vitamin and mineral supplements as recommended by your health care provider. What happens during an annual well check? The services and screenings done by your health care provider during your annual well check will depend  on your age, overall health, lifestyle risk factors, and family history of disease. Counseling  Your health care provider may ask you questions about your: Alcohol use. Tobacco use. Drug use. Emotional well-being. Home and relationship well-being. Sexual activity. Eating habits. History of falls. Memory and ability to understand (cognition). Work and work Statistician. Screening  You may have the following tests or measurements: Height, weight, and BMI. Blood pressure. Lipid and cholesterol levels. These may be checked every 5 years, or more frequently if you are over 22 years old. Skin check. Lung cancer screening. You may have this screening every year starting at age 74 if you have a 30-pack-year history of smoking and currently smoke or have quit within the past 15 years. Fecal occult blood test (FOBT) of the stool. You may have this test every year starting at age 7. Flexible sigmoidoscopy or colonoscopy. You may have a sigmoidoscopy every 5 years or a colonoscopy every 10 years starting at age 52. Prostate cancer screening. Recommendations will vary depending on your family history and other risks. Hepatitis C blood test. Hepatitis B blood test. Sexually transmitted disease (STD) testing. Diabetes screening. This is done by checking your blood sugar (glucose) after you have not eaten for a while (fasting). You may have this done every 1-3 years. Abdominal aortic aneurysm (AAA) screening. You may need this if you are a current or former smoker. Osteoporosis. You may be screened starting at age 75 if you are at high risk. Talk with your health care provider about your test results, treatment options, and if necessary, the need for more tests. Vaccines  Your health care provider may recommend certain vaccines, such as: Influenza vaccine. This is recommended  every year. Tetanus, diphtheria, and acellular pertussis (Tdap, Td) vaccine. You may need a Td booster every 10  years. Zoster vaccine. You may need this after age 108. Pneumococcal 13-valent conjugate (PCV13) vaccine. One dose is recommended after age 66. Pneumococcal polysaccharide (PPSV23) vaccine. One dose is recommended after age 40. Talk to your health care provider about which screenings and vaccines you need and how often you need them. This information is not intended to replace advice given to you by your health care provider. Make sure you discuss any questions you have with your health care provider. Document Released: 04/27/2015 Document Revised: 12/19/2015 Document Reviewed: 01/30/2015 Elsevier Interactive Patient Education  2017 [employer] Prevention in the Home Falls can cause injuries. They can happen to people of all ages. There are many things you can do to make your home safe and to help prevent falls. What can I do on the outside of my home? Regularly fix the edges of walkways and driveways and fix any cracks. Remove anything that might make you trip as you walk through a door, such as a raised step or threshold. Trim any bushes or trees on the path to your home. Use bright outdoor lighting. Clear any walking paths of anything that might make someone trip, such as rocks or tools. Regularly check to see if handrails are loose or broken. Make sure that both sides of any steps have handrails. Any raised decks and porches should have guardrails on the edges. Have any leaves, snow, or ice cleared regularly. Use sand or salt on walking paths during winter. Clean up any spills in your garage right away. This includes oil or grease spills. What can I do in the bathroom? Use night lights. Install grab bars by the toilet and in the tub and shower. Do not use towel bars as grab bars. Use non-skid mats or decals in the tub or shower. If you need to sit down in the shower, use a plastic, non-slip stool. Keep the floor dry. Clean up any water that spills on the floor as soon as it  happens. Remove soap buildup in the tub or shower regularly. Attach bath mats securely with double-sided non-slip rug tape. Do not have throw rugs and other things on the floor that can make you trip. What can I do in the bedroom? Use night lights. Make sure that you have a light by your bed that is easy to reach. Do not use any sheets or blankets that are too big for your bed. They should not hang down onto the floor. Have a firm chair that has side arms. You can use this for support while you get dressed. Do not have throw rugs and other things on the floor that can make you trip. What can I do in the kitchen? Clean up any spills right away. Avoid walking on wet floors. Keep items that you use a lot in easy-to-reach places. If you need to reach something above you, use a strong step stool that has a grab bar. Keep electrical cords out of the way. Do not use floor polish or wax that makes floors slippery. If you must use wax, use non-skid floor wax. Do not have throw rugs and other things on the floor that can make you trip. What can I do with my stairs? Do not leave any items on the stairs. Make sure that there are handrails on both sides of the stairs and use them. Fix handrails that are broken  or loose. Make sure that handrails are as long as the stairways. Check any carpeting to make sure that it is firmly attached to the stairs. Fix any carpet that is loose or worn. Avoid having throw rugs at the top or bottom of the stairs. If you do have throw rugs, attach them to the floor with carpet tape. Make sure that you have a light switch at the top of the stairs and the bottom of the stairs. If you do not have them, ask someone to add them for you. What else can I do to help prevent falls? Wear shoes that: Do not have high heels. Have rubber bottoms. Are comfortable and fit you well. Are closed at the toe. Do not wear sandals. If you use a stepladder: Make sure that it is fully opened.  Do not climb a closed stepladder. Make sure that both sides of the stepladder are locked into place. Ask someone to hold it for you, if possible. Clearly mark and make sure that you can see: Any grab bars or handrails. First and last steps. Where the edge of each step is. Use tools that help you move around (mobility aids) if they are needed. These include: Canes. Walkers. Scooters. Crutches. Turn on the lights when you go into a dark area. Replace any light bulbs as soon as they burn out. Set up your furniture so you have a clear path. Avoid moving your furniture around. If any of your floors are uneven, fix them. If there are any pets around you, be aware of where they are. Review your medicines with your doctor. Some medicines can make you feel dizzy. This can increase your chance of falling. Ask your doctor what other things that you can do to help prevent falls. This information is not intended to replace advice given to you by your health care provider. Make sure you discuss any questions you have with your health care provider. Document Released: 01/25/2009 Document Revised: 09/06/2015 Document Reviewed: 05/05/2014 Elsevier Interactive Patient Education  2017 [employer].

## 2020-11-19 NOTE — Progress Notes (Signed)
Virtual Visit via Telephone Note  I connected with  David Armstrong on 11/19/20 at  1:00 PM EDT by telephone and verified that I am speaking with the correct person using two identifiers.  Medicare Annual Wellness visit completed telephonically due to Covid-19 pandemic.   Persons participating in this call: This Health Coach and this patient.   Location: Patient: Home Provider: Office    I discussed the limitations, risks, security and privacy concerns of performing an evaluation and management service by telephone and the availability of in person appointments. The patient expressed understanding and agreed to proceed.  Unable to perform video visit due to video visit attempted and failed and/or patient does not have video capability.   Some vital signs may be absent or patient reported.   Willette Brace, LPN   Subjective:   David Armstrong is a 70 y.o. male who presents for Medicare Annual/Subsequent preventive examination.  Review of Systems     Cardiac Risk Factors include: advanced age (>17mn, >>62women);male gender;dyslipidemia     Objective:    Today's Vitals   11/19/20 1256  PainSc: 6    There is no height or weight on file to calculate BMI.  Advanced Directives 11/19/2020 11/14/2019 09/11/2019 09/10/2019 05/05/2017 03/18/2017 03/16/2017  Does Patient Have a Medical Advance Directive? Yes No No No No No No  Does patient want to make changes to medical advance directive? Yes (MAU/Ambulatory/Procedural Areas - Information given) - - - - - -  Would patient like information on creating a medical advance directive? - Yes (MAU/Ambulatory/Procedural Areas - Information given) - No - Patient declined Yes (MAU/Ambulatory/Procedural Areas - Information given) No - Patient declined No - Patient declined    Current Medications (verified) Outpatient Encounter Medications as of 11/19/2020  Medication Sig   betamethasone dipropionate 0.05 % cream Apply topically 2 (two) times daily.    desonide (DESOWEN) 0.05 % lotion APPLY EXTERNALLY TO THE AFFECTED AREA EVERY DAY AS NEEDED   fluticasone (FLONASE) 50 MCG/ACT nasal spray Place 2 sprays into both nostrils daily.   hyoscyamine (LEVSIN SL) 0.125 MG SL tablet Take 2 tablets (0.25 mg total) by mouth every 6 (six) hours as needed. DISSOLVE 1 TABLET(0.125 MG) UNDER THE TONGUE EVERY 6 HOURS AS NEEDED   LORazepam (ATIVAN) 1 MG tablet Take 0.5-1 tablets (0.5-1 mg total) by mouth every 8 (eight) hours as needed for anxiety.   omeprazole (PRILOSEC) 20 MG capsule TAKE 1 CAPSULE BY MOUTH EVERY DAY AS NEEDED   rivaroxaban (XARELTO) 20 MG TABS tablet Take 1 tablet (20 mg total) by mouth daily with supper. Start 20 mg daily tablets after completion of 3 weeks of 15 mg BID dosing.   diclofenac (VOLTAREN) 75 MG EC tablet Take 1 tablet (75 mg total) by mouth 2 (two) times daily. (Patient not taking: Reported on 11/19/2020)   Elastic Bandages & Supports (MEDICAL COMPRESSION STOCKINGS) MISC Use daily. (Patient not taking: Reported on 11/19/2020)   lovastatin (MEVACOR) 20 MG tablet Take 1 tablet (20 mg total) by mouth at bedtime. (Patient not taking: Reported on 11/19/2020)   No facility-administered encounter medications on file as of 11/19/2020.    Allergies (verified) Effexor xr [venlafaxine hcl er], Acyclovir and related, Zocor [simvastatin], Dexilant [dexlansoprazole], Ivp dye [iodinated diagnostic agents], Soma [carisoprodol], and Sulfa antibiotics   History: Past Medical History:  Diagnosis Date   Anxiety    Arthritis    "lower back; hips;' right knee"    Bipolar disorder ([place]  Chronic constipation    Depression    Diverticulosis of colon    Edema of both lower extremities    ANKLES   Fatty liver    GERD (gastroesophageal reflux disease)    Hiatal hernia    History of anal fissures    History of deep vein thrombosis (DVT) of lower extremity 02/11/2016   right lower extremity distal and proximal extensive acute   History of  Helicobacter pylori infection 2012; 2009   History of kidney stones    History of pneumonia 01/2015   CAP   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    Hyperlipidemia    IBS (irritable bowel syndrome)    Left ureteral stone    Nocturia    Peripheral neuropathy    per pt due to back DDD   Personal history of colonic polyps    HYPERPLASTIC POLYP   Pulmonary nodule, right    incidental finding CT chest angio 02-11-2016 stable   TMJ (temporomandibular joint syndrome)    Varicosities of leg    Past Surgical History:  Procedure Laterality Date   BELPHAROPTOSIS REPAIR Bilateral 01/2015   CATARACT EXTRACTION W/ INTRAOCULAR LENS  IMPLANT, BILATERAL  04/2016   COLONOSCOPY WITH ESOPHAGOGASTRODUODENOSCOPY (EGD)  last one 03-02-2013   CYSTOSCOPY/RETROGRADE/URETEROSCOPY/STONE EXTRACTION WITH BASKET  2010   CYSTOSCOPY/URETEROSCOPY/HOLMIUM LASER/STENT PLACEMENT Left 03/16/2017   Procedure: CYSTOSCOPY/RETROGRADE/URETEROSCOPY/HOLMIUM LASER/STENT PLACEMENT, STONE BASKET EXTRACTION AND STENT PLACEMENT;  Surgeon: [name], MD;  Location: [facility];  Service: Urology;  Laterality: Left;   EXCISIONAL HEMORRHOIDECTOMY  2015 approx.   EXTRACORPOREAL SHOCK WAVE LITHOTRIPSY  1987; 1992   FOOT NEUROMA SURGERY Bilateral left 09/2015; right 04/ 2017   morton' neuroma   FOOT NEUROMA SURGERY Right 2004   morton's neuroma and removal heel spur   HAMMER TOE SURGERY Left 2013   5th toe   INCISION AND DRAINAGE FOOT Left 1992   "ball of foot; stepped on piece of hard wire & it went thru my shoe"   KNEE ARTHROSCOPY Bilateral left x2 1997;  right 2004 & 2005   NASAL FRACTURE SURGERY  1997   PATELLA-FEMORAL ARTHROPLASTY Right 07-06-2003   dr Eddie Dibbles [facility]   PLANTAR FASCIA RELEASE Bilateral 1987 & 1993   RHINOPLASTY  2000   SHOULDER ARTHROSCOPY Left 2012;  2013   SHOULDER ARTHROSCOPY WITH DISTAL CLAVICLE RESECTION Right 07-31-2005   dr Percell Miller  [facility]   w/ Debridement labral  and adhesions, acromioplasty, bursectomy, and CA ligament release   TRANSTHORACIC ECHOCARDIOGRAM  02/12/2016   ef Q000111Q, grade 1 diastolic dysfunction/  trivial TR   Family History  Problem Relation Age of Onset   Heart disease Mother    Stroke Mother    Cirrhosis Father    Heart disease Father    Skin cancer Father    Prostate cancer Maternal Grandfather    Parkinson's disease Paternal Grandfather    Heart disease Brother    Colon cancer Neg Hx    Stomach cancer Neg Hx    Social History   Socioeconomic History   Marital status: Divorced    Spouse name: Not on file   Number of children: 0   Years of education: Not on file   Highest education level: Not on file  Occupational History   Occupation: retired    Fish farm manager: [place] [employer]    Comment: disabled now  Tobacco Use   Smoking status: Never   Smokeless tobacco: Never  Vaping Use   Vaping  Use: Never used  Substance and Sexual Activity   Alcohol use: No    Alcohol/week: 0.0 standard drinks   Drug use: No   Sexual activity: Never  Other Topics Concern   Not on file  Social History Narrative   Goes by "[name]"   Divorced   No children   Retired from the post office   Has internet girlfriend on facebook from [place] Strain: Low Risk    Difficulty of Paying Living Expenses: Not hard at all  Food Insecurity: No Food Insecurity   Worried About Charity fundraiser in the Last Year: Never true   Arboriculturist in the Last Year: Never true  Transportation Needs: No Transportation Needs   Lack of Transportation (Medical): No   Lack of Transportation (Non-Medical): No  Physical Activity: Insufficiently Active   Days of Exercise per Week: 4 days   Minutes of Exercise per Session: 30 min  Stress: Stress Concern Present   Feeling of Stress : To some extent  Social Connections: Socially Isolated   Frequency of Communication with Friends and Family: Twice a week    Frequency of Social Gatherings with Friends and Family: Never   Attends Religious Services: Never   Printmaker: No   Attends Music therapist: Never   Marital Status: Divorced    Tobacco Counseling Counseling given: Not Answered   Clinical Intake:  Pre-visit preparation completed: Yes  Pain : 0-10 Pain Score: 6  Pain Type: Chronic pain Pain Location: Back (feet and hands) Pain Descriptors / Indicators: Aching, Pressure Pain Onset: More than a month ago Pain Frequency: Constant     BMI - recorded: 23.06 Nutritional Status: BMI of 19-24  Normal Nutritional Risks: None Diabetes: No  How often do you need to have someone help you when you read instructions, pamphlets, or other written materials from your doctor or pharmacy?: 1 - Never  Diabetic?No  Interpreter Needed?: No  Information entered by :: Charlott Rakes, LPN   Activities of Daily Living In your present state of health, do you have any difficulty performing the following activities: 11/19/2020  Hearing? N  Vision? N  Difficulty concentrating or making decisions? N  Walking or climbing stairs? N  Dressing or bathing? N  Doing errands, shopping? N  Preparing Food and eating ? N  Using the Toilet? N  In the past six months, have you accidently leaked urine? N  Do you have problems with loss of bowel control? Y  Comment accidents at times  Managing your Medications? N  Managing your Finances? N  Housekeeping or managing your Housekeeping? N  Some recent data might be hidden    Patient Care Team: Vivi Barrack, MD as PCP - General (Family Medicine) Michael Boston, MD as Consulting Physician (General Surgery) Sable Feil, MD as Consulting Physician (Gastroenterology) Willia Craze, NP as Nurse Practitioner (Gastroenterology) [name], MD (Inactive) as Consulting Physician (Urology) [name], MD as Consulting Physician (Psychiatry) Alyson Ingles  Candee Furbish, MD as Consulting Physician (Urology)  Indicate any recent Medical Services you may have received from other than [facility] providers in the past year (date may be approximate).     Assessment:   This is a routine wellness examination for David Armstrong.  Hearing/Vision screen Hearing Screening - Comments:: Pt denies any hearing issues  Vision Screening - Comments:: Pt follows up with Dr Barbie Haggis for annual eye  exams   Dietary issues and exercise activities discussed: Current Exercise Habits: Home exercise routine, Type of exercise: walking, Time (Minutes): 25, Frequency (Times/Week): 3, Weekly Exercise (Minutes/Week): 75   Goals Addressed             This Visit's Progress    Patient Stated       None at this time       Depression Screen PHQ 2/9 Scores 11/19/2020 10/10/2020 06/07/2020 11/14/2019 08/04/2018 05/26/2017 05/05/2017  PHQ - 2 Score '1 2 4 6 4 4 2  '$ PHQ- 9 Score - '8 17 15 10 12 5    '$ Fall Risk Fall Risk  11/19/2020 10/10/2020 11/14/2019 12/30/2018 08/04/2018  Falls in the past year? 0 0 1 0 0  Number falls in past yr: 0 0 1 - -  Injury with Fall? 0 0 0 - -  Comment - - fell in [place] needles by a miss steps - -  Risk for fall due to : Impaired vision No Fall Risks - - -  Follow up Falls prevention discussed - - - -    FALL RISK PREVENTION PERTAINING TO THE HOME:  Any stairs in or around the home? Yes  If so, are there any without handrails? No  Home free of loose throw rugs in walkways, pet beds, electrical cords, etc? Yes  Adequate lighting in your home to reduce risk of falls? Yes   ASSISTIVE DEVICES UTILIZED TO PREVENT FALLS:  Life alert? No  Use of a cane, walker or w/c? No  Grab bars in the bathroom? Yes  Shower chair or bench in shower? Yes  Elevated toilet seat or a handicapped toilet? No   TIMED UP AND GO:  Was the test performed? No   Cognitive Function:     6CIT Screen 11/19/2020 11/14/2019  What Year? 0 points -  What month? 0 points -  What time? 0  points 0 points  Count back from 20 0 points 0 points  Months in reverse 0 points 0 points  Repeat phrase 0 points 4 points  Total Score 0 -    Immunizations Immunization History  Administered Date(s) Administered   Influenza Split 12/22/2012, 01/13/2016   Influenza, High Dose Seasonal PF 01/19/2018, 12/02/2018   Influenza,inj,Quad PF,6+ Mos 01/07/2014   Influenza-Unspecified 02/12/2015, 01/13/2016, 12/30/2019   Moderna Sars-Covid-2 Vaccination 06/15/2019, 07/13/2019, 07/31/2020   Pneumococcal Conjugate-13 08/05/2016   Pneumococcal Polysaccharide-23 01/19/2018   Tdap 11/29/2012, 06/04/2016   Zoster Recombinat (Shingrix) 07/13/2016, 09/12/2016   Zoster, Live 11/29/2012    TDAP status: Up to date  Flu Vaccine status: Due, Education has been provided regarding the importance of this vaccine. Advised may receive this vaccine at local pharmacy or Health Dept. Aware to provide a copy of the vaccination record if obtained from local pharmacy or Health Dept. Verbalized acceptance and understanding.  Pneumococcal vaccine status: Up to date  Covid-19 vaccine status: Completed vaccines  Qualifies for Shingles Vaccine? Yes   Zostavax completed No   Shingrix Completed?: Yes  Screening Tests Health Maintenance  Topic Date Due   COVID-19 Vaccine (4 - Booster for Moderna series) 10/30/2020   INFLUENZA VACCINE  11/12/2020   COLONOSCOPY (Pts 45-73yr Insurance coverage will need to be confirmed)  03/03/2023   TETANUS/TDAP  06/04/2026   Hepatitis C Screening  Completed   PNA vac Low Risk Adult  Completed   Zoster Vaccines- Shingrix  Completed   HPV VACCINES  Aged Out    Health Maintenance  Health Maintenance Due  Topic Date Due   COVID-19 Vaccine (4 - Booster for Moderna series) 10/30/2020   INFLUENZA VACCINE  11/12/2020    Colorectal cancer screening: Type of screening: Colonoscopy. Completed 03/02/13. Repeat every 10 years   Additional Screening:  Hepatitis C Screening:   Completed 11/24/16  Vision Screening: Recommended annual ophthalmology exams for early detection of glaucoma and other disorders of the eye. Is the patient up to date with their annual eye exam?  Yes  Who is the provider or what is the name of the office in which the patient attends annual eye exams? Dr Cecilie Lowers [place]  If pt is not established with a provider, would they like to be referred to a provider to establish care? No .   Dental Screening: Recommended annual dental exams for proper oral hygiene  Community Resource Referral / Chronic Care Management: CRR required this visit?  No   CCM required this visit?  No      Plan:     I have personally reviewed and noted the following in the patient's chart:   Medical and social history Use of alcohol, tobacco or illicit drugs  Current medications and supplements including opioid prescriptions. Patient is not currently taking opioid prescriptions. Functional ability and status Nutritional status Physical activity Advanced directives List of other physicians Hospitalizations, surgeries, and ER visits in previous 12 months Vitals Screenings to include cognitive, depression, and falls Referrals and appointments  In addition, I have reviewed and discussed with patient certain preventive protocols, quality metrics, and best practice recommendations. A written personalized care plan for preventive services as well as general preventive health recommendations were provided to patient.     Willette Brace, LPN   [id number]   Nurse Notes: None

## 2020-11-30 ENCOUNTER — Other Ambulatory Visit: Payer: Self-pay

## 2020-11-30 ENCOUNTER — Ambulatory Visit (INDEPENDENT_AMBULATORY_CARE_PROVIDER_SITE_OTHER): Payer: Medicare Other | Admitting: Family Medicine

## 2020-11-30 ENCOUNTER — Encounter: Payer: Self-pay | Admitting: Family Medicine

## 2020-11-30 VITALS — BP 102/68 | HR 84 | Temp 97.0°F | Ht 72.0 in | Wt 165.8 lb

## 2020-11-30 DIAGNOSIS — M1990 Unspecified osteoarthritis, unspecified site: Secondary | ICD-10-CM

## 2020-11-30 DIAGNOSIS — I824Z1 Acute embolism and thrombosis of unspecified deep veins of right distal lower extremity: Secondary | ICD-10-CM

## 2020-11-30 DIAGNOSIS — R233 Spontaneous ecchymoses: Secondary | ICD-10-CM | POA: Diagnosis not present

## 2020-11-30 DIAGNOSIS — G4730 Sleep apnea, unspecified: Secondary | ICD-10-CM

## 2020-11-30 DIAGNOSIS — L989 Disorder of the skin and subcutaneous tissue, unspecified: Secondary | ICD-10-CM | POA: Insufficient documentation

## 2020-11-30 DIAGNOSIS — R5383 Other fatigue: Secondary | ICD-10-CM | POA: Diagnosis not present

## 2020-11-30 DIAGNOSIS — R600 Localized edema: Secondary | ICD-10-CM

## 2020-11-30 DIAGNOSIS — Z79899 Other long term (current) drug therapy: Secondary | ICD-10-CM

## 2020-11-30 LAB — COMPREHENSIVE METABOLIC PANEL
ALT: 17 U/L (ref 0–53)
AST: 18 U/L (ref 0–37)
Albumin: 4 g/dL (ref 3.5–5.2)
Alkaline Phosphatase: 74 U/L (ref 39–117)
BUN: 17 mg/dL (ref 6–23)
CO2: 28 mEq/L (ref 19–32)
Calcium: 9 mg/dL (ref 8.4–10.5)
Chloride: 106 mEq/L (ref 96–112)
Creatinine, Ser: 0.84 mg/dL (ref 0.40–1.50)
GFR: 88.86 mL/min (ref >60.00)
Glucose, Bld: 62 mg/dL — ABNORMAL LOW (ref 70–99)
Potassium: 3.3 mEq/L — ABNORMAL LOW (ref 3.5–5.1)
Sodium: 142 mEq/L (ref 135–145)
Total Bilirubin: 0.8 mg/dL (ref 0.2–1.2)
Total Protein: 6.7 g/dL (ref 6.0–8.3)

## 2020-11-30 LAB — CBC
HCT: 44.4 % (ref 39.0–52.0)
Hemoglobin: 14.8 g/dL (ref 13.0–17.0)
MCHC: 33.5 g/dL (ref 30.0–36.0)
MCV: 91.7 fl (ref 78.0–100.0)
Platelets: 201 10*3/uL (ref 150.0–400.0)
RBC: 4.84 Mil/uL (ref 4.22–5.81)
RDW: 14 % (ref 11.5–15.5)
WBC: 6 10*3/uL (ref 4.0–10.5)

## 2020-11-30 LAB — TSH: TSH: 3.62 u[IU]/mL (ref 0.35–5.50)

## 2020-11-30 LAB — VITAMIN B12: Vitamin B-12: 150 pg/mL — ABNORMAL LOW (ref 211–911)

## 2020-11-30 MED ORDER — METHYLPREDNISOLONE ACETATE 80 MG/ML IJ SUSP
80.0000 mg | Freq: Once | INTRAMUSCULAR | Status: AC
  Administered 2020-11-30: 80 mg via INTRAMUSCULAR

## 2020-11-30 MED ORDER — KETOROLAC TROMETHAMINE 60 MG/2ML IM SOLN
60.0000 mg | Freq: Once | INTRAMUSCULAR | Status: AC
  Administered 2020-11-30: 60 mg via INTRAMUSCULAR

## 2020-11-30 NOTE — Patient Instructions (Addendum)
It was very nice to see you today!  We biopsied the spot on your skin today.  This should come back within a week or so.  We will check blood work to look for any possible causes of fatigue.  We may need to get a sleep study done.  Please talk with your vascular surgeon about the swelling and reflux in the legs.  We will give you an injection of Depo-Medrol and Toradol today for your arthritis pain.  Take care, Dr [name]  PLEASE NOTE:  If you had any lab tests please let us know if you have not heard back within a few days. You may see your results on mychart before we have a chance to review them but we will give you a call once they are reviewed by [place]. If we ordered any referrals today, please let us know if you have not heard from their office within the next week.   Please try these tips to maintain a healthy lifestyle:  Eat at least 3 REAL meals and 1-2 snacks per day.  Aim for no more than 5 hours between eating.  If you eat breakfast, please do so within one hour of getting up.   Each meal should contain half fruits/vegetables, one quarter protein, and one quarter carbs (no bigger than a computer mouse)  Cut down on sweet beverages. This includes juice, soda, and sweet tea.   Drink at least 1 glass of water with each meal and aim for at least 8 glasses per day  Exercise at least 150 minutes every week.

## 2020-11-30 NOTE — Assessment & Plan Note (Signed)
Punch biopsy performed today.  See below procedure note.  He tolerated well.

## 2020-11-30 NOTE — Assessment & Plan Note (Signed)
Recent ultrasound showed no signs of DVT.  He will need to be on lifelong anticoagulation however given that he has had multiple DVTs in the past.

## 2020-11-30 NOTE — Assessment & Plan Note (Signed)
Has recent flareup of pain consistent with his typical arthritis pain.  We will give 80 mg Depo-Medrol and 60 mg Toradol as this is worked well for him in the past.

## 2020-11-30 NOTE — Assessment & Plan Note (Signed)
We will check labs today.  Did have an ultrasound done recently which showed some venous reflux.  Started him to follow-up with vascular surgery soon.

## 2020-11-30 NOTE — Assessment & Plan Note (Signed)
High suspicion for possible OSA.  He deferred sleep study evaluation today.

## 2020-11-30 NOTE — Progress Notes (Signed)
David Armstrong is a 70 y.o. male who presents today for an office visit.  Assessment/Plan:  New/Acute Problems: Other fatigue Concern for possible sleep apnea given constellation of symptoms.  He does not wish to have sleep study done at this time.  We will check labs including CBC, c-Met, TSH, and B12 levels.  Depending on results will rediscuss need for sleep study.  Chronic Problems Addressed Today: Skin lesion Punch biopsy performed today.  See below procedure note.  He tolerated well.  Sleep-disordered breathing High suspicion for possible OSA.  He deferred sleep study evaluation today.  Osteoarthritis Has recent flareup of pain consistent with his typical arthritis pain.  We will give 80 mg Depo-Medrol and 60 mg Toradol as this is worked well for him in the past.  DVT (deep venous thrombosis) (HCC) Recent ultrasound showed no signs of DVT.  He will need to be on lifelong anticoagulation however given that he has had multiple DVTs in the past.  Leg edema We will check labs today.  Did have an ultrasound done recently which showed some venous reflux.  Started him to follow-up with vascular surgery soon.     Subjective:  HPI:  CC of the patient is pain, fatigue and weakness. He also states that he still has a rash on his right upper inner leg. The rash was significantly more wide spread, but was greatly reduced and alleviated with a rash cream.  Recently saw dermatologist who told him that he was bit by something.  Rashes been persistent for several months at this point.  On the fatigue, he sometimes feels so tired that he can end up just falling asleep. He states that his ex wife told him that he snores some, and that he is staying in bed a bit more often. Additionally when he wakes up he states he experiences generalized pain.  He occasionally dozes off during the day.  Does not feel refreshed upon wakening.  He states that he experiences swelling accompanied with pain in  his right lower leg.     He request injections of Depo-Medrol and Toradol today for his arthritis pain.  He received this in the past most recently a few months ago which worked well for him.       Objective:  Physical Exam: BP 102/68   Pulse 84   Temp (!) 97 F (36.1 C) (Temporal)   Ht 6' (1.829 m)   Wt 165 lb 12.8 oz (75.2 kg)   SpO2 99%   BMI 22.49 kg/m   Gen: No acute distress, resting comfortably CV: Regular rate and rhythm with no murmurs appreciated Pulm: Normal work of breathing, clear to auscultation bilaterally with no crackles, wheezes, or rhonchi SKin: Several scattered approximately 1 to 2 cm erythematous lesions on lower extremities. Neuro: Grossly normal, moves all extremities Psych: Normal affect and thought content   Procedure Note: After informed verbal consent was obtained, using Betadine for cleansing and 1% Lidocaine with epinephrine for anesthetic, with sterile technique a 3 mm punch biopsy was used to obtain a biopsy specimen of a typical appearing lesion on his right inner thigh he had. Hemostasis was obtained by pressure and wound was not sutured. Antibiotic dressing is applied, and wound care instructions provided. Be alert for any signs of cutaneous infection. The specimen is labeled and sent to pathology for evaluation. The procedure was well tolerated without complications.       I,Jordan Kelly,acting as a Education administrator for [employer], MD.,have documented  all relevant documentation on the behalf of [name], MD,as directed by  [name], MD while in the presence of [name], MD.  I, [name], MD, have reviewed all documentation for this visit. The documentation on 11/30/20 for the exam, diagnosis, procedures, and orders are all accurate and complete.  Time Spent: 47 minutes of total time was spent on the date of the encounter performing the following actions: chart review prior to seeing the patient, obtaining history, performing a medically  necessary exam, counseling on the treatment plan including possible referral for sleep study., placing orders, and documenting in our EHR.  This time does not include time spent performing the above procedure.   [name]. [name], MD 11/30/2020 8:47 AM

## 2020-12-07 ENCOUNTER — Telehealth: Payer: Self-pay

## 2020-12-07 NOTE — Telephone Encounter (Signed)
Portia from [facility] called needing clinical information regarding patient he has a biopsy of right leg and she is needing information  Please return call [phone] EXT [phone]

## 2020-12-10 NOTE — Telephone Encounter (Signed)
Has this been handled?

## 2020-12-11 NOTE — Progress Notes (Signed)
Please inform patient of the following:  His B12 is low.  This could explain some of his symptoms.  Recommend starting B12 protocol here.  Pathology on his lab results is benign.  He is likely having small bruising/bleeding due to his Eliquis.  We can continue to monitor for now.  Do not need to do any further work-up at this point.

## 2020-12-12 DIAGNOSIS — L820 Inflamed seborrheic keratosis: Secondary | ICD-10-CM | POA: Diagnosis not present

## 2020-12-12 DIAGNOSIS — L308 Other specified dermatitis: Secondary | ICD-10-CM | POA: Diagnosis not present

## 2020-12-12 DIAGNOSIS — N401 Enlarged prostate with lower urinary tract symptoms: Secondary | ICD-10-CM | POA: Diagnosis not present

## 2020-12-12 DIAGNOSIS — L570 Actinic keratosis: Secondary | ICD-10-CM | POA: Diagnosis not present

## 2020-12-12 DIAGNOSIS — R351 Nocturia: Secondary | ICD-10-CM | POA: Diagnosis not present

## 2020-12-12 DIAGNOSIS — R102 Pelvic and perineal pain: Secondary | ICD-10-CM | POA: Diagnosis not present

## 2020-12-12 DIAGNOSIS — N200 Calculus of kidney: Secondary | ICD-10-CM | POA: Diagnosis not present

## 2020-12-12 DIAGNOSIS — H61002 Unspecified perichondritis of left external ear: Secondary | ICD-10-CM | POA: Diagnosis not present

## 2020-12-14 ENCOUNTER — Telehealth: Payer: Self-pay

## 2020-12-14 NOTE — Telephone Encounter (Signed)
Pt called for lab results 

## 2020-12-14 NOTE — Telephone Encounter (Signed)
See result notes. 

## 2020-12-18 ENCOUNTER — Other Ambulatory Visit: Payer: Self-pay | Admitting: Family Medicine

## 2020-12-20 DIAGNOSIS — M545 Low back pain, unspecified: Secondary | ICD-10-CM | POA: Diagnosis not present

## 2020-12-20 DIAGNOSIS — R200 Anesthesia of skin: Secondary | ICD-10-CM | POA: Diagnosis not present

## 2020-12-20 DIAGNOSIS — R519 Headache, unspecified: Secondary | ICD-10-CM | POA: Diagnosis not present

## 2020-12-20 DIAGNOSIS — M542 Cervicalgia: Secondary | ICD-10-CM | POA: Diagnosis not present

## 2020-12-20 DIAGNOSIS — G5603 Carpal tunnel syndrome, bilateral upper limbs: Secondary | ICD-10-CM | POA: Diagnosis not present

## 2020-12-20 DIAGNOSIS — R252 Cramp and spasm: Secondary | ICD-10-CM | POA: Diagnosis not present

## 2020-12-24 DIAGNOSIS — M7989 Other specified soft tissue disorders: Secondary | ICD-10-CM

## 2020-12-25 ENCOUNTER — Other Ambulatory Visit: Payer: Self-pay

## 2020-12-25 ENCOUNTER — Ambulatory Visit (INDEPENDENT_AMBULATORY_CARE_PROVIDER_SITE_OTHER): Payer: Medicare Other

## 2020-12-25 DIAGNOSIS — Z23 Encounter for immunization: Secondary | ICD-10-CM

## 2021-01-03 ENCOUNTER — Other Ambulatory Visit: Payer: Self-pay

## 2021-01-03 ENCOUNTER — Ambulatory Visit (INDEPENDENT_AMBULATORY_CARE_PROVIDER_SITE_OTHER): Payer: Medicare Other | Admitting: *Deleted

## 2021-01-03 DIAGNOSIS — E538 Deficiency of other specified B group vitamins: Secondary | ICD-10-CM | POA: Diagnosis not present

## 2021-01-03 MED ORDER — CYANOCOBALAMIN 1000 MCG/ML IJ SOLN
1000.0000 ug | Freq: Once | INTRAMUSCULAR | Status: AC
  Administered 2021-01-03: 1000 ug via INTRAMUSCULAR

## 2021-01-03 NOTE — Progress Notes (Signed)
Patient presents for B12 injection today. Patient received her B12 injection in left Deltoid. Patient tolerated injection well.  Documentation entered in MAR in [employer].   

## 2021-01-10 ENCOUNTER — Ambulatory Visit (INDEPENDENT_AMBULATORY_CARE_PROVIDER_SITE_OTHER): Payer: Medicare Other

## 2021-01-10 ENCOUNTER — Other Ambulatory Visit: Payer: Self-pay

## 2021-01-10 DIAGNOSIS — E538 Deficiency of other specified B group vitamins: Secondary | ICD-10-CM

## 2021-01-10 MED ORDER — CYANOCOBALAMIN 1000 MCG/ML IJ SOLN
1000.0000 ug | Freq: Once | INTRAMUSCULAR | Status: AC
  Administered 2021-01-10: 1000 ug via INTRAMUSCULAR

## 2021-01-10 NOTE — Progress Notes (Signed)
David Armstrong 70 yr old male presents to office for 2nd of 4 weekly B12 injection. Administered CYANOCOBALAMIN 1,000 mcg IM right arm. Patient tolerated well.

## 2021-01-17 ENCOUNTER — Ambulatory Visit (INDEPENDENT_AMBULATORY_CARE_PROVIDER_SITE_OTHER): Payer: Medicare Other | Admitting: *Deleted

## 2021-01-17 ENCOUNTER — Other Ambulatory Visit: Payer: Self-pay

## 2021-01-17 DIAGNOSIS — E538 Deficiency of other specified B group vitamins: Secondary | ICD-10-CM | POA: Diagnosis not present

## 2021-01-17 MED ORDER — CYANOCOBALAMIN 1000 MCG/ML IJ SOLN
1000.0000 ug | Freq: Once | INTRAMUSCULAR | Status: AC
  Administered 2021-01-17: 1000 ug via INTRAMUSCULAR

## 2021-01-17 NOTE — Progress Notes (Signed)
Patient presents for B12 injection today. Patient received her B12 injection in Right Deltoid. Patient tolerated injection well.  Documentation entered in [facility] in [place].

## 2021-01-24 ENCOUNTER — Other Ambulatory Visit: Payer: Self-pay

## 2021-01-24 ENCOUNTER — Ambulatory Visit (INDEPENDENT_AMBULATORY_CARE_PROVIDER_SITE_OTHER): Payer: Medicare Other

## 2021-01-24 DIAGNOSIS — E538 Deficiency of other specified B group vitamins: Secondary | ICD-10-CM | POA: Diagnosis not present

## 2021-01-24 MED ORDER — CYANOCOBALAMIN 1000 MCG/ML IJ SOLN
1000.0000 ug | Freq: Once | INTRAMUSCULAR | Status: AC
  Administered 2021-01-24: 1000 ug via INTRAMUSCULAR

## 2021-01-24 NOTE — Progress Notes (Signed)
David Armstrong 70 yr old male presents to office today for 4 of 4 weekly B12 injections per [name], MD. Administered CYANOCOBALAMIN 1,000 mcg IM right arm. Patient tolerated well. Aware to return in one month to start monthly injections.

## 2021-01-25 ENCOUNTER — Telehealth: Payer: Self-pay

## 2021-01-25 ENCOUNTER — Encounter: Payer: Self-pay | Admitting: Physician Assistant

## 2021-01-25 ENCOUNTER — Ambulatory Visit (INDEPENDENT_AMBULATORY_CARE_PROVIDER_SITE_OTHER): Payer: Medicare Other | Admitting: Physician Assistant

## 2021-01-25 ENCOUNTER — Ambulatory Visit (HOSPITAL_COMMUNITY)
Admission: RE | Admit: 2021-01-25 | Discharge: 2021-01-25 | Disposition: A | Discharge status: Home / Self Care | Payer: Medicare Other | Source: Ambulatory Visit | Attending: Physician Assistant | Admitting: Physician Assistant

## 2021-01-25 VITALS — BP 120/86 | HR 95 | Temp 98.7°F | Wt 173.4 lb

## 2021-01-25 DIAGNOSIS — M79661 Pain in right lower leg: Secondary | ICD-10-CM | POA: Diagnosis not present

## 2021-01-25 DIAGNOSIS — M7989 Other specified soft tissue disorders: Secondary | ICD-10-CM

## 2021-01-25 MED ORDER — KETOROLAC TROMETHAMINE 60 MG/2ML IM SOLN
30.0000 mg | Freq: Once | INTRAMUSCULAR | Status: AC
  Administered 2021-01-25: 30 mg via INTRAMUSCULAR

## 2021-01-25 MED ORDER — METHYLPREDNISOLONE ACETATE 40 MG/ML IJ SUSP
40.0000 mg | Freq: Once | INTRAMUSCULAR | Status: AC
  Administered 2021-01-25: 40 mg via INTRAMUSCULAR

## 2021-01-25 NOTE — Telephone Encounter (Signed)
Ultrasound negative for DVT , David Armstrong from imaging department reported

## 2021-01-25 NOTE — Patient Instructions (Signed)
It was great to see you!  Please go get your ultrasound, I will be in touch with the results.  Take care,  Inda Coke PA-C

## 2021-01-25 NOTE — Progress Notes (Signed)
David Armstrong is a 70 y.o. male here for a follow up of a pre-existing problem.  History of Present Illness:   Chief Complaint  Patient presents with   Leg Swelling    Mostly right leg, ongoing for about 3 weeks     HPI  R leg swelling Over the past 3 weeks has had ongoing swelling and pain in R lower leg. Has hx of DVT and PE. Currently taking 20 mg xarelto daily. He has tried compression but feels like this does not help, cannot tell me how consistently he does this. Tries to keep leg elevated.  Most recent ultrasound of leg on 10/12/20 showed no DVT.  Denies: chest pain, SOB, palpitations  Past Medical History:  Diagnosis Date   Anxiety    Arthritis    "lower back; hips;' right knee"    Bipolar disorder ([place])    Chronic constipation    Depression    Diverticulosis of colon    Edema of both lower extremities    ANKLES   Fatty liver    GERD (gastroesophageal reflux disease)    Hiatal hernia    History of anal fissures    History of deep vein thrombosis (DVT) of lower extremity 02/11/2016   right lower extremity distal and proximal extensive acute   History of Helicobacter pylori infection 2012; 2009   History of kidney stones    History of pneumonia 01/2015   CAP   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    Hyperlipidemia    IBS (irritable bowel syndrome)    Left ureteral stone    Nocturia    Peripheral neuropathy    per pt due to back DDD   Personal history of colonic polyps    HYPERPLASTIC POLYP   Pulmonary nodule, right    incidental finding CT chest angio 02-11-2016 stable   TMJ (temporomandibular joint syndrome)    Varicosities of leg      Social History   Tobacco Use   Smoking status: Never   Smokeless tobacco: Never  Vaping Use   Vaping Use: Never used  Substance Use Topics   Alcohol use: No    Alcohol/week: 0.0 standard drinks   Drug use: No    Past Surgical History:  Procedure Laterality Date   BELPHAROPTOSIS  REPAIR Bilateral 01/2015   CATARACT EXTRACTION W/ INTRAOCULAR LENS  IMPLANT, BILATERAL  04/2016   COLONOSCOPY WITH ESOPHAGOGASTRODUODENOSCOPY (EGD)  last one 03-02-2013   CYSTOSCOPY/RETROGRADE/URETEROSCOPY/STONE EXTRACTION WITH BASKET  2010   CYSTOSCOPY/URETEROSCOPY/HOLMIUM LASER/STENT PLACEMENT Left 03/16/2017   Procedure: CYSTOSCOPY/RETROGRADE/URETEROSCOPY/HOLMIUM LASER/STENT PLACEMENT, STONE BASKET EXTRACTION AND STENT PLACEMENT;  Surgeon: [name], MD;  Location: [place];  Service: Urology;  Laterality: Left;   EXCISIONAL HEMORRHOIDECTOMY  2015 approx.   EXTRACORPOREAL SHOCK WAVE LITHOTRIPSY  1987; 1992   FOOT NEUROMA SURGERY Bilateral left 09/2015; right 04/ 2017   morton' neuroma   FOOT NEUROMA SURGERY Right 2004   morton's neuroma and removal heel spur   HAMMER TOE SURGERY Left 2013   5th toe   INCISION AND DRAINAGE FOOT Left 1992   "ball of foot; stepped on piece of hard wire & it went thru my shoe"   KNEE ARTHROSCOPY Bilateral left x2 1997;  right 2004 & 2005   [place]   PATELLA-FEMORAL ARTHROPLASTY Right 07-06-2003   dr Eddie Dibbles [facility]   PLANTAR FASCIA RELEASE Bilateral 1987 & 1993   RHINOPLASTY  2000   SHOULDER ARTHROSCOPY Left  2012;  2013   SHOULDER ARTHROSCOPY WITH DISTAL CLAVICLE RESECTION Right 07-31-2005   dr Percell Miller  [facility]   w/ Debridement labral and adhesions, acromioplasty, bursectomy, and CA ligament release   TRANSTHORACIC ECHOCARDIOGRAM  02/12/2016   ef 10-27%, grade 1 diastolic dysfunction/  trivial TR    Family History  Problem Relation Age of Onset   Heart disease Mother    Stroke Mother    Cirrhosis Father    Heart disease Father    Skin cancer Father    Prostate cancer Maternal Grandfather    Parkinson's disease Paternal Grandfather    Heart disease Brother    Colon cancer Neg Hx    Stomach cancer Neg Hx     Allergies  Allergen Reactions   Effexor Xr [Venlafaxine Hcl Er] Other (See Comments)    Insomnia    Acyclovir And Related    Zocor [Simvastatin] Other (See Comments)    Muscle ache/ pain   Dexilant [Dexlansoprazole] Other (See Comments)    Muscle aches   Ivp Dye [Iodinated Diagnostic Agents] Rash   Soma [Carisoprodol] Other (See Comments)    Sores on arm   Sulfa Antibiotics Rash    Current Medications:   Current Outpatient Medications:    betamethasone dipropionate 0.05 % cream, Apply topically 2 (two) times daily., Disp: 45 g, Rfl: 2   desonide (DESOWEN) 0.05 % lotion, APPLY TOPICALLY TO THE AFFECTED AREA EVERY DAY AS NEEDED, Disp: 118 mL, Rfl: 2   diclofenac (VOLTAREN) 75 MG EC tablet, Take 1 tablet (75 mg total) by mouth 2 (two) times daily., Disp: 30 tablet, Rfl: 0   Elastic Bandages & Supports (MEDICAL COMPRESSION STOCKINGS) MISC, Use daily., Disp: 2 each, Rfl: 0   fluticasone (FLONASE) 50 MCG/ACT nasal spray, Place 2 sprays into both nostrils daily., Disp: 16 g, Rfl: 6   hyoscyamine (LEVSIN SL) 0.125 MG SL tablet, Take 2 tablets (0.25 mg total) by mouth every 6 (six) hours as needed. DISSOLVE 1 TABLET(0.125 MG) UNDER THE TONGUE EVERY 6 HOURS AS NEEDED, Disp: 120 tablet, Rfl: 3   LORazepam (ATIVAN) 1 MG tablet, Take 0.5-1 tablets (0.5-1 mg total) by mouth every 8 (eight) hours as needed for anxiety., Disp: 90 tablet, Rfl: 0   omeprazole (PRILOSEC) 20 MG capsule, TAKE 1 CAPSULE BY MOUTH EVERY DAY AS NEEDED, Disp: , Rfl:    rivaroxaban (XARELTO) 20 MG TABS tablet, Take 1 tablet (20 mg total) by mouth daily with supper. Start 20 mg daily tablets after completion of 3 weeks of 15 mg BID dosing., Disp: 90 tablet, Rfl: 1   lovastatin (MEVACOR) 20 MG tablet, Take 1 tablet (20 mg total) by mouth at bedtime. (Patient not taking: Reported on 01/25/2021), Disp: 30 tablet, Rfl: 1   Review of Systems:   ROS Negative unless otherwise specified per HPI.  Vitals:   Vitals:   01/25/21 1401  BP: 120/86  Pulse: 95  Temp: 98.7 F (37.1 C)  TempSrc: Temporal  SpO2: 96%  Weight: 173 lb 6.4  oz (78.7 kg)     Body mass index is 23.52 kg/m.  Physical Exam:   Physical Exam Vitals and nursing note reviewed.  Constitutional:      General: He is not in acute distress.    Appearance: He is well-developed. He is not ill-appearing or toxic-appearing.  Cardiovascular:     Rate and Rhythm: Normal rate and regular rhythm.     Pulses: Normal pulses.          Dorsalis pedis  pulses are 2+ on the right side.     Heart sounds: Normal heart sounds, S1 normal and S2 normal.  Pulmonary:     Effort: Pulmonary effort is normal.     Breath sounds: Normal breath sounds.  Musculoskeletal:     Comments: Diffuse swelling in RLE  TTP to calf R calf diameter: 36 cm L calf: 32 cm  Skin:    General: Skin is warm and dry.  Neurological:     Mental Status: He is alert.     GCS: GCS eye subscore is 4. GCS verbal subscore is 5. GCS motor subscore is 6.  Psychiatric:        Speech: Speech normal.        Behavior: Behavior normal. Behavior is cooperative.    Assessment and Plan:   Pain and swelling of lower leg, right - Plan: VAS [place] LOWER EXTREMITY VENOUS (DVT)  Concern for possible DVT vs muscle strain Stat US to r/o DVT He endorses 100% compliance with xarelto 20 mg, I explained to him that if he has evidence of new DVT since last scan, will send to ER Patient verbalized understanding to plan 30 mg toradol injection and 40 mg depomedrol injection provided today for pain relief Tolerated well  Inda Coke, PA-C

## 2021-01-28 ENCOUNTER — Telehealth: Payer: Self-pay

## 2021-01-28 NOTE — Telephone Encounter (Signed)
Pt called regarding the ultrasound he has on his leg last week. He stated that he has not heard from [place] and wants to know if he needs to come in for another appt. Please Advise.

## 2021-01-28 NOTE — Telephone Encounter (Signed)
Spoke to pt told him Ultrasound on legs were negative, No DVT. Pt verbalized understanding and said he is still having pain. Told pt to schedule follow up with Dr. [name]. Pt verbalized understanding and said he will call back to schedule.

## 2021-01-29 ENCOUNTER — Ambulatory Visit (INDEPENDENT_AMBULATORY_CARE_PROVIDER_SITE_OTHER): Payer: Medicare Other | Admitting: Family Medicine

## 2021-01-29 ENCOUNTER — Other Ambulatory Visit: Payer: Self-pay

## 2021-01-29 VITALS — BP 115/75 | HR 86 | Temp 98.2°F | Ht 72.0 in | Wt 170.2 lb

## 2021-01-29 DIAGNOSIS — R1310 Dysphagia, unspecified: Secondary | ICD-10-CM | POA: Diagnosis not present

## 2021-01-29 DIAGNOSIS — K219 Gastro-esophageal reflux disease without esophagitis: Secondary | ICD-10-CM | POA: Diagnosis not present

## 2021-01-29 DIAGNOSIS — M79604 Pain in right leg: Secondary | ICD-10-CM

## 2021-01-29 DIAGNOSIS — R600 Localized edema: Secondary | ICD-10-CM

## 2021-01-29 DIAGNOSIS — I824Z1 Acute embolism and thrombosis of unspecified deep veins of right distal lower extremity: Secondary | ICD-10-CM

## 2021-01-29 DIAGNOSIS — M7989 Other specified soft tissue disorders: Secondary | ICD-10-CM | POA: Diagnosis not present

## 2021-01-29 NOTE — Progress Notes (Signed)
David Armstrong is a 70 y.o. male who presents today for an office visit.  Assessment/Plan:  Chronic Problems Addressed Today: Leg edema Chronic issue.  Has known venous reflux.  He has difficulty wearing compression stockings.  Discussed leg elevation and avoidance of prolonged sitting or standing.  Discussed importance of salt avoidance.  We will check echocardiogram.  He will probably need to follow back up with vascular surgery if this continues to be an issue.  GERD Has followed with GI though has had a couple of episodes with dysphagia recently.  Advised him to call to schedule appointment soon.  We will continue omeprazole 20 mg daily.  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     Subjective:  HPI:  He is here to follow up on leg edema.this has been a chronic issue for the last several years.  He has a history of DVT x2 in his right lower extremity.  He has seen vascular surgery in the past due to his history of DVT as well as issues with lower extremity swelling.  He saw a different provider in this office about 5 days ago with lower leg swelling and pain. Concern for DVT or muscle strain. He was given Toradol 30 mg and Depomedrol 40 mg for pain at that time.  No reported orthopnea.  He does not watch his sodium intake.  He continues having issue with swelling in his legs He reports swelling is better since last visit. He wore compression stocking for the last few days which seems to help. He is short of breath when bending over. Had ultrasound done which showed no DVT.  No reported chest pain. He has a PMhx of DVT and PE.  He does not like wearing compression stockings due to discomfort.  He has also had a couple of issues the last several weeks where he feels like food will become lodged in the middle of his esophagus.  He called EMS during his most recent episode.  Eventually the food was able to  pass.  Does have a history of reflux.  He has been compliant with his omeprazole.        Objective:  Physical Exam: BP 115/75   Pulse 86   Temp 98.2 F (36.8 C)   Ht 6' (1.829 m)   Wt 170 lb 3.2 oz (77.2 kg)   SpO2 97%   BMI 23.08 kg/m   Gen: No acute distress, resting comfortably CV: Regular rate and rhythm with no murmurs appreciated Pulm: Normal work of breathing, clear to auscultation bilaterally with no crackles, wheezes, or rhonchi MSK: Right lower extremity with 2+ pitting edema to knee.  Left leg with 1+ pitting edema to knee. Neuro: Grossly normal, moves all extremities Psych: Normal affect and thought content       I,Savera Zaman,acting as a scribe for [name], MD.,have documented all relevant documentation on the behalf of [name], MD,as directed by  [name], MD while in the presence of [name], MD.   I, [name], MD, have reviewed all documentation for this visit. The documentation on 01/29/21 for the exam, diagnosis, procedures, and orders are all accurate and complete.  Time Spent: 45 minutes of total time was spent on the date of the encounter performing the following actions: chart review prior to seeing the patient including recent studies and office visits, obtaining history, performing a medically necessary exam, counseling  on the treatment plan, placing orders, and documenting in our EHR.   [name]. [name], MD 01/29/2021 4:19 PM

## 2021-01-29 NOTE — Patient Instructions (Addendum)
It was very nice to see you today!  Please call your GI doctor to schedule an appointment.  We will get an echocardiogram.   Please use the compression as much as possible. Please avoid salt.   Take care, Dr [name]  PLEASE NOTE:  If you had any lab tests please let us know if you have not heard back within a few days. You may see your results on mychart before we have a chance to review them but we will give you a call once they are reviewed by [place]. If we ordered any referrals today, please let us know if you have not heard from their office within the next week.   Please try these tips to maintain a healthy lifestyle:  Eat at least 3 REAL meals and 1-2 snacks per day.  Aim for no more than 5 hours between eating.  If you eat breakfast, please do so within one hour of getting up.   Each meal should contain half fruits/vegetables, one quarter protein, and one quarter carbs (no bigger than a computer mouse)  Cut down on sweet beverages. This includes juice, soda, and sweet tea.   Drink at least 1 glass of water with each meal and aim for at least 8 glasses per day  Exercise at least 150 minutes every week.

## 2021-01-29 NOTE — Assessment & Plan Note (Addendum)
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

## 2021-01-29 NOTE — Assessment & Plan Note (Signed)
Has followed with GI though has had a couple of episodes with dysphagia recently.  Advised him to call to schedule appointment soon.  We will continue omeprazole 20 mg daily.

## 2021-01-29 NOTE — Assessment & Plan Note (Signed)
Chronic issue.  Has known venous reflux.  He has difficulty wearing compression stockings.  Discussed leg elevation and avoidance of prolonged sitting or standing.  Discussed importance of salt avoidance.  We will check echocardiogram.  He will probably need to follow back up with vascular surgery if this continues to be an issue.

## 2021-01-31 ENCOUNTER — Telehealth: Payer: Self-pay

## 2021-01-31 NOTE — Telephone Encounter (Signed)
Pt called stating that Dr [name] put in an order for an Echocardiogram to be done at [facility]. Susie stated that he still has not heard anything to be scheduled. Please Advise.

## 2021-02-13 ENCOUNTER — Other Ambulatory Visit: Payer: Self-pay

## 2021-02-13 ENCOUNTER — Ambulatory Visit (INDEPENDENT_AMBULATORY_CARE_PROVIDER_SITE_OTHER): Payer: Medicare Other

## 2021-02-13 DIAGNOSIS — R600 Localized edema: Secondary | ICD-10-CM | POA: Diagnosis not present

## 2021-02-13 DIAGNOSIS — M25561 Pain in right knee: Secondary | ICD-10-CM | POA: Diagnosis not present

## 2021-02-13 LAB — ECHOCARDIOGRAM COMPLETE
Area-P 1/2: 3.76 cm2
S' Lateral: 2.65 cm

## 2021-02-14 ENCOUNTER — Other Ambulatory Visit (HOSPITAL_COMMUNITY): Payer: Self-pay | Admitting: Physician Assistant

## 2021-02-14 DIAGNOSIS — M25561 Pain in right knee: Secondary | ICD-10-CM

## 2021-02-14 DIAGNOSIS — M7989 Other specified soft tissue disorders: Secondary | ICD-10-CM

## 2021-02-14 NOTE — Progress Notes (Signed)
Please inform patient of the following:  No concerning findings on his echocardiogram. Recommend he follow up with vascular surgery if his leg swelling continues to be an issue.

## 2021-02-15 ENCOUNTER — Other Ambulatory Visit (HOSPITAL_COMMUNITY): Payer: Self-pay | Admitting: Physician Assistant

## 2021-02-15 DIAGNOSIS — M25561 Pain in right knee: Secondary | ICD-10-CM

## 2021-02-20 ENCOUNTER — Encounter (HOSPITAL_COMMUNITY)
Admission: RE | Admit: 2021-02-20 | Discharge: 2021-02-20 | Disposition: A | Discharge status: Home / Self Care | Payer: Medicare Other | Source: Ambulatory Visit | Attending: Physician Assistant | Admitting: Physician Assistant

## 2021-02-20 ENCOUNTER — Other Ambulatory Visit: Payer: Self-pay

## 2021-02-20 DIAGNOSIS — Z471 Aftercare following joint replacement surgery: Secondary | ICD-10-CM | POA: Diagnosis not present

## 2021-02-20 DIAGNOSIS — M25561 Pain in right knee: Secondary | ICD-10-CM | POA: Insufficient documentation

## 2021-02-20 DIAGNOSIS — Z96651 Presence of right artificial knee joint: Secondary | ICD-10-CM | POA: Diagnosis not present

## 2021-02-20 MED ORDER — TECHNETIUM TC 99M MEDRONATE IV KIT
22.0000 | PACK | Freq: Once | INTRAVENOUS | Status: AC | PRN
  Administered 2021-02-20: 22 via INTRAVENOUS

## 2021-02-21 ENCOUNTER — Encounter: Payer: Self-pay | Admitting: Family Medicine

## 2021-02-25 NOTE — Telephone Encounter (Signed)
Left message to return call to our office at their convenience.  

## 2021-02-28 ENCOUNTER — Other Ambulatory Visit: Payer: Self-pay

## 2021-02-28 ENCOUNTER — Ambulatory Visit (INDEPENDENT_AMBULATORY_CARE_PROVIDER_SITE_OTHER): Payer: Medicare Other | Admitting: *Deleted

## 2021-02-28 ENCOUNTER — Ambulatory Visit: Payer: Medicare Other

## 2021-02-28 DIAGNOSIS — M542 Cervicalgia: Secondary | ICD-10-CM | POA: Diagnosis not present

## 2021-02-28 DIAGNOSIS — M6281 Muscle weakness (generalized): Secondary | ICD-10-CM | POA: Diagnosis not present

## 2021-02-28 DIAGNOSIS — R252 Cramp and spasm: Secondary | ICD-10-CM | POA: Diagnosis not present

## 2021-02-28 DIAGNOSIS — R202 Paresthesia of skin: Secondary | ICD-10-CM | POA: Diagnosis not present

## 2021-02-28 DIAGNOSIS — M545 Low back pain, unspecified: Secondary | ICD-10-CM | POA: Diagnosis not present

## 2021-02-28 DIAGNOSIS — E538 Deficiency of other specified B group vitamins: Secondary | ICD-10-CM

## 2021-02-28 DIAGNOSIS — G5603 Carpal tunnel syndrome, bilateral upper limbs: Secondary | ICD-10-CM | POA: Diagnosis not present

## 2021-02-28 MED ORDER — CYANOCOBALAMIN 1000 MCG/ML IJ SOLN
1000.0000 ug | Freq: Once | INTRAMUSCULAR | Status: AC
  Administered 2021-02-28: 15:00:00 1000 ug via INTRAMUSCULAR

## 2021-02-28 NOTE — Progress Notes (Signed)
Patient presents for B12 injection today. Patient received her B12 injection in Right Deltoid. Patient tolerated injection well.  Documentation entered in [facility] in [place].

## 2021-03-04 DIAGNOSIS — L570 Actinic keratosis: Secondary | ICD-10-CM | POA: Diagnosis not present

## 2021-03-04 DIAGNOSIS — L820 Inflamed seborrheic keratosis: Secondary | ICD-10-CM | POA: Diagnosis not present

## 2021-03-28 ENCOUNTER — Other Ambulatory Visit: Payer: Self-pay

## 2021-03-28 ENCOUNTER — Ambulatory Visit (INDEPENDENT_AMBULATORY_CARE_PROVIDER_SITE_OTHER): Payer: Medicare Other

## 2021-03-28 VITALS — Ht 72.0 in

## 2021-03-28 DIAGNOSIS — E538 Deficiency of other specified B group vitamins: Secondary | ICD-10-CM | POA: Diagnosis not present

## 2021-03-28 MED ORDER — CYANOCOBALAMIN 1000 MCG/ML IJ SOLN
1000.0000 ug | Freq: Once | INTRAMUSCULAR | Status: AC
Start: 2021-03-28 — End: 2021-03-28
  Administered 2021-03-28: 1000 ug via INTRAMUSCULAR

## 2021-03-28 NOTE — Progress Notes (Signed)
Per orders of Dr. Parker, injection of B-12 given by Zeffie Bickert D Landi Biscardi in left deltoid. Patient tolerated injection well. Patient will make appointment for 1 month.  

## 2021-04-03 DIAGNOSIS — M9903 Segmental and somatic dysfunction of lumbar region: Secondary | ICD-10-CM | POA: Diagnosis not present

## 2021-04-03 DIAGNOSIS — M5137 Other intervertebral disc degeneration, lumbosacral region: Secondary | ICD-10-CM | POA: Diagnosis not present

## 2021-04-03 DIAGNOSIS — M9904 Segmental and somatic dysfunction of sacral region: Secondary | ICD-10-CM | POA: Diagnosis not present

## 2021-04-03 DIAGNOSIS — M9905 Segmental and somatic dysfunction of pelvic region: Secondary | ICD-10-CM | POA: Diagnosis not present

## 2021-04-08 DIAGNOSIS — M9904 Segmental and somatic dysfunction of sacral region: Secondary | ICD-10-CM | POA: Diagnosis not present

## 2021-04-08 DIAGNOSIS — M5137 Other intervertebral disc degeneration, lumbosacral region: Secondary | ICD-10-CM | POA: Diagnosis not present

## 2021-04-08 DIAGNOSIS — M9905 Segmental and somatic dysfunction of pelvic region: Secondary | ICD-10-CM | POA: Diagnosis not present

## 2021-04-08 DIAGNOSIS — M9903 Segmental and somatic dysfunction of lumbar region: Secondary | ICD-10-CM | POA: Diagnosis not present

## 2021-04-09 ENCOUNTER — Encounter: Payer: Self-pay | Admitting: Physician Assistant

## 2021-04-09 ENCOUNTER — Other Ambulatory Visit: Payer: Self-pay

## 2021-04-09 ENCOUNTER — Ambulatory Visit (INDEPENDENT_AMBULATORY_CARE_PROVIDER_SITE_OTHER): Payer: Medicare Other | Admitting: Physician Assistant

## 2021-04-09 ENCOUNTER — Other Ambulatory Visit: Payer: Self-pay | Admitting: Family Medicine

## 2021-04-09 VITALS — BP 118/78 | HR 94 | Temp 98.2°F | Ht 72.0 in | Wt 168.4 lb

## 2021-04-09 DIAGNOSIS — K589 Irritable bowel syndrome without diarrhea: Secondary | ICD-10-CM | POA: Diagnosis not present

## 2021-04-09 DIAGNOSIS — M9905 Segmental and somatic dysfunction of pelvic region: Secondary | ICD-10-CM | POA: Diagnosis not present

## 2021-04-09 DIAGNOSIS — M9903 Segmental and somatic dysfunction of lumbar region: Secondary | ICD-10-CM | POA: Diagnosis not present

## 2021-04-09 DIAGNOSIS — M545 Low back pain, unspecified: Secondary | ICD-10-CM

## 2021-04-09 DIAGNOSIS — M9904 Segmental and somatic dysfunction of sacral region: Secondary | ICD-10-CM | POA: Diagnosis not present

## 2021-04-09 DIAGNOSIS — M5137 Other intervertebral disc degeneration, lumbosacral region: Secondary | ICD-10-CM | POA: Diagnosis not present

## 2021-04-09 MED ORDER — METHYLPREDNISOLONE ACETATE 80 MG/ML IJ SUSP
80.0000 mg | Freq: Once | INTRAMUSCULAR | Status: AC
  Administered 2021-04-09: 15:00:00 80 mg via INTRAMUSCULAR

## 2021-04-09 MED ORDER — HYOSCYAMINE SULFATE 0.125 MG SL SUBL: 0.2500 mg | SUBLINGUAL_TABLET | Freq: Four times a day (QID) | SUBLINGUAL | 3 refills | Status: DC | PRN

## 2021-04-09 MED ORDER — KETOROLAC TROMETHAMINE 60 MG/2ML IM SOLN
60.0000 mg | Freq: Once | INTRAMUSCULAR | Status: AC
  Administered 2021-04-09: 15:00:00 60 mg via INTRAMUSCULAR

## 2021-04-09 MED ORDER — PREDNISONE 20 MG PO TABS: 20.0000 mg | ORAL_TABLET | Freq: Two times a day (BID) | ORAL | 0 refills | Status: DC

## 2021-04-09 NOTE — Progress Notes (Signed)
David Armstrong is a 70 y.o. male here for back pain.  History of Present Illness:   Chief Complaint  Patient presents with   Back Pain    Pt c/o lower back pain and having issues getting dressed and putting on shoes; feels like a catch in his back X 1 wk    Back Pain David Armstrong presents with c/o back pain that has been onset for a week. David Armstrong described the pain feels like a catch in his back and stated this occurred when he was cleaning out his car. In an effort to relieve his pain he has taken tylenol which provided minor relief but caused stomach upset. At this time he rates the pain a 9 out of 10 which makes it hard for him to get up from bed or get dressed. Pt does have a hx of arthritis of his lower back, but states current pain is not similar to baseline arthritic pain. Denies CP, SOB, numbness or tingling down legs, bowel/urinary incontinence, hematuria, painful urination, or frequent urination. He went to the chiropractor without any relief.  IBS Taking hyoscyamine 0.125 mg SL as needed up to 4 times daily for abdominal pain. Needs refill. States that this medication works well for him.  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    Hyperlipidemia    IBS (irritable bowel syndrome)    Left ureteral stone    Nocturia    Peripheral neuropathy    per pt due to back DDD    Personal history of colonic polyps    HYPERPLASTIC POLYP   Pulmonary nodule, right    incidental finding CT chest angio 02-11-2016 stable   TMJ (temporomandibular joint syndrome)    Varicosities of leg      Social History   Tobacco Use   Smoking status: Never   Smokeless tobacco: Never  Vaping Use   Vaping Use: Never used  Substance Use Topics   Alcohol use: No    Alcohol/week: 0.0 standard drinks   Drug use: No    Past Surgical History:  Procedure Laterality Date   BELPHAROPTOSIS REPAIR Bilateral 01/2015   CATARACT EXTRACTION W/ INTRAOCULAR LENS  IMPLANT, BILATERAL  04/2016   COLONOSCOPY WITH ESOPHAGOGASTRODUODENOSCOPY (EGD)  last one 03-02-2013   CYSTOSCOPY/RETROGRADE/URETEROSCOPY/STONE EXTRACTION WITH BASKET  2010   CYSTOSCOPY/URETEROSCOPY/HOLMIUM LASER/STENT PLACEMENT Left 03/16/2017   Procedure: CYSTOSCOPY/RETROGRADE/URETEROSCOPY/HOLMIUM LASER/STENT PLACEMENT, STONE BASKET EXTRACTION AND STENT PLACEMENT;  Surgeon: [name], MD;  Location: [place];  Service: Urology;  Laterality: Left;   EXCISIONAL HEMORRHOIDECTOMY  2015 approx.   EXTRACORPOREAL SHOCK WAVE LITHOTRIPSY  1987; 1992   FOOT NEUROMA SURGERY Bilateral left 09/2015; right 04/ 2017   morton' neuroma   FOOT NEUROMA SURGERY Right 2004  morton's neuroma and removal heel spur   HAMMER TOE SURGERY Left 2013   5th toe   INCISION AND DRAINAGE FOOT Left 1992   "ball of foot; stepped on piece of hard wire & it went thru my shoe"   KNEE ARTHROSCOPY Bilateral left x2 1997;  right 2004 & 2005   NASAL FRACTURE SURGERY  1997   PATELLA-FEMORAL ARTHROPLASTY Right 07-06-2003   dr Eddie Dibbles [facility]   PLANTAR FASCIA RELEASE Bilateral 1987 & 1993   RHINOPLASTY  2000   SHOULDER ARTHROSCOPY Left 2012;  2013   SHOULDER ARTHROSCOPY WITH DISTAL CLAVICLE RESECTION Right 07-31-2005   dr Percell Miller  [facility]   w/ Debridement labral and adhesions, acromioplasty, bursectomy, and CA ligament release   TRANSTHORACIC ECHOCARDIOGRAM   02/12/2016   ef 06-30%, grade 1 diastolic dysfunction/  trivial TR    Family History  Problem Relation Age of Onset   Heart disease Mother    Stroke Mother    Cirrhosis Father    Heart disease Father    Skin cancer Father    Prostate cancer Maternal Grandfather    Parkinson's disease Paternal Grandfather    Heart disease Brother    Colon cancer Neg Hx    Stomach cancer Neg Hx     Allergies  Allergen Reactions   Effexor Xr [Venlafaxine Hcl Er] Other (See Comments)    Insomnia   Acyclovir And Related    Zocor [Simvastatin] Other (See Comments)    Muscle ache/ pain   Dexilant [Dexlansoprazole] Other (See Comments)    Muscle aches   Ivp Dye [Iodinated Contrast Media] Rash   Soma [Carisoprodol] Other (See Comments)    Sores on arm   Sulfa Antibiotics Rash    Current Medications:   Current Outpatient Medications:    betamethasone dipropionate 0.05 % cream, Apply topically 2 (two) times daily., Disp: 45 g, Rfl: 2   desonide (DESOWEN) 0.05 % lotion, APPLY TOPICALLY TO THE AFFECTED AREA EVERY DAY AS NEEDED, Disp: 118 mL, Rfl: 2   diclofenac (VOLTAREN) 75 MG EC tablet, Take 1 tablet (75 mg total) by mouth 2 (two) times daily., Disp: 30 tablet, Rfl: 0   Elastic Bandages & Supports (MEDICAL COMPRESSION STOCKINGS) MISC, Use daily., Disp: 2 each, Rfl: 0   fluticasone (FLONASE) 50 MCG/ACT nasal spray, Place 2 sprays into both nostrils daily., Disp: 16 g, Rfl: 6   hyoscyamine (LEVSIN SL) 0.125 MG SL tablet, Take 2 tablets (0.25 mg total) by mouth every 6 (six) hours as needed. DISSOLVE 1 TABLET(0.125 MG) UNDER THE TONGUE EVERY 6 HOURS AS NEEDED, Disp: 120 tablet, Rfl: 3   LORazepam (ATIVAN) 1 MG tablet, Take 0.5-1 tablets (0.5-1 mg total) by mouth every 8 (eight) hours as needed for anxiety., Disp: 90 tablet, Rfl: 0   omeprazole (PRILOSEC) 20 MG capsule, TAKE 1 CAPSULE BY MOUTH EVERY DAY AS NEEDED, Disp: , Rfl:    rivaroxaban (XARELTO) 20 MG TABS tablet, Take 1 tablet (20 mg total) by  mouth daily with supper. Start 20 mg daily tablets after completion of 3 weeks of 15 mg BID dosing., Disp: 90 tablet, Rfl: 1   Review of Systems:   Review of Systems  Musculoskeletal:  Positive for back pain.   Vitals:   Vitals:   04/09/21 1409  BP: 118/78  Pulse: 94  Temp: 98.2 F (36.8 C)  TempSrc: Temporal  SpO2: 99%  Weight: 168 lb 6.4 oz (76.4 kg)  Height: 6' (1.829 m)     Body mass index is  22.84 kg/m.  Physical Exam:   Physical Exam Vitals and nursing note reviewed.  Constitutional:      General: He is not in acute distress.    Appearance: He is well-developed. He is not ill-appearing or toxic-appearing.  Cardiovascular:     Rate and Rhythm: Normal rate and regular rhythm.     Pulses: Normal pulses.     Heart sounds: Normal heart sounds, S1 normal and S2 normal.  Pulmonary:     Effort: Pulmonary effort is normal.     Breath sounds: Normal breath sounds.  Abdominal:     Tenderness: There is no right CVA tenderness or left CVA tenderness.  Musculoskeletal:     Cervical back: No bony tenderness.     Thoracic back: Tenderness present. No bony tenderness.     Lumbar back: Tenderness present. No bony tenderness.  Skin:    General: Skin is warm and dry.  Neurological:     Mental Status: He is alert.     GCS: GCS eye subscore is 4. GCS verbal subscore is 5. GCS motor subscore is 6.  Psychiatric:        Speech: Speech normal.        Behavior: Behavior normal. Behavior is cooperative.    Assessment and Plan:   Acute Back Pain No red flags on exam or discussion Administered depomedrol 80 mg injection and toradol 60 mg injection today, patient tolerated well  Start oral prednisone 20 mg twice daily x 5 days -- start tomorrow Advised patient to follow up with Dr. Trula Ore, neurology or reach out to our office if no improvement of symptoms occurs -- will likely refer urgently to sports medicine Informed patient that if worsening of symptoms such as urinary/bowel  incontinence, numbness/ tingling in legs, or lightheadedness occurs to visit the ER immediately  IBS Well controlled Needs refill on hyoscyamine 0.125 mg SL tablet daily as needed  I,Havlyn C Ratchford,acting as a scribe for [employer], PA.,have documented all relevant documentation on the behalf of Inda Coke, PA,as directed by  Inda Coke, PA while in the presence of Inda Coke, [place].  I, Inda Coke, [place], have reviewed all documentation for this visit. The documentation on 04/09/21 for the exam, diagnosis, procedures, and orders are all accurate and complete.  Inda Coke, PA-C

## 2021-04-09 NOTE — Patient Instructions (Signed)
It was great to see you!  We are giving you a steroid and toradol injection today  You can start the oral prednisone tomorrow  If you do not have improvement of symptoms, please call your neurologist Dr. Trula Ore  OR let me know and I will urgently refer you to our sports medicine team whom you have seen in the past.  Contact a health care provider if: You have pain that is not relieved with rest or medicine. You have increasing pain going down into your legs or buttocks. Your pain does not improve after 2 weeks. You have pain at night. You lose weight without trying. You have a fever or chills. You develop nausea or vomiting. You develop abdominal pain.  Go to the ER to get help right away if: You develop new bowel or bladder control problems. You have unusual weakness or numbness in your arms or legs. You feel faint.  Take care,  Inda Coke PA-C

## 2021-04-10 DIAGNOSIS — M9904 Segmental and somatic dysfunction of sacral region: Secondary | ICD-10-CM | POA: Diagnosis not present

## 2021-04-10 DIAGNOSIS — M9903 Segmental and somatic dysfunction of lumbar region: Secondary | ICD-10-CM | POA: Diagnosis not present

## 2021-04-10 DIAGNOSIS — M5137 Other intervertebral disc degeneration, lumbosacral region: Secondary | ICD-10-CM | POA: Diagnosis not present

## 2021-04-10 DIAGNOSIS — M9905 Segmental and somatic dysfunction of pelvic region: Secondary | ICD-10-CM | POA: Diagnosis not present

## 2021-04-10 DIAGNOSIS — M25561 Pain in right knee: Secondary | ICD-10-CM | POA: Insufficient documentation

## 2021-04-11 DIAGNOSIS — M9904 Segmental and somatic dysfunction of sacral region: Secondary | ICD-10-CM | POA: Diagnosis not present

## 2021-04-11 DIAGNOSIS — M5137 Other intervertebral disc degeneration, lumbosacral region: Secondary | ICD-10-CM | POA: Diagnosis not present

## 2021-04-11 DIAGNOSIS — M25561 Pain in right knee: Secondary | ICD-10-CM | POA: Diagnosis not present

## 2021-04-11 DIAGNOSIS — M9905 Segmental and somatic dysfunction of pelvic region: Secondary | ICD-10-CM | POA: Diagnosis not present

## 2021-04-11 DIAGNOSIS — M9903 Segmental and somatic dysfunction of lumbar region: Secondary | ICD-10-CM | POA: Diagnosis not present

## 2021-04-15 DIAGNOSIS — M9903 Segmental and somatic dysfunction of lumbar region: Secondary | ICD-10-CM | POA: Diagnosis not present

## 2021-04-15 DIAGNOSIS — M9904 Segmental and somatic dysfunction of sacral region: Secondary | ICD-10-CM | POA: Diagnosis not present

## 2021-04-15 DIAGNOSIS — M5137 Other intervertebral disc degeneration, lumbosacral region: Secondary | ICD-10-CM | POA: Diagnosis not present

## 2021-04-15 DIAGNOSIS — M9905 Segmental and somatic dysfunction of pelvic region: Secondary | ICD-10-CM | POA: Diagnosis not present

## 2021-04-16 ENCOUNTER — Ambulatory Visit: Payer: Medicare Other | Admitting: Sports Medicine

## 2021-04-16 NOTE — Progress Notes (Deleted)
° °   Benito Mccreedy D.[place] Phone: [phone]   Assessment and Plan:     There are no diagnoses linked to this encounter.  ***   Pertinent previous records reviewed include ***   Follow Up: ***     Subjective:   I, Anjolina Byrer, am serving as a Education administrator for Doctor [name]  Chief Complaint: back pain   HPI:   04/16/21 Patient is a 71 year old male complaining of back pain. Patient states back pain has been or a week. Patient described the pain feels like a catch in his back and stated this occurred when he was cleaning out his car. In an effort to relieve his pain he has taken tylenol which provided minor relief but caused stomach upset. Pt does have a hx of arthritis of his lower back, but states current pain is not similar to baseline arthritic pain.  Relevant Historical Information: ***  Additional pertinent review of systems negative.   Current Outpatient Medications:    betamethasone dipropionate 0.05 % cream, Apply topically 2 (two) times daily., Disp: 45 g, Rfl: 2   desonide (DESOWEN) 0.05 % lotion, APPLY TOPICALLY TO THE AFFECTED AREA EVERY DAY AS NEEDED, Disp: 118 mL, Rfl: 2   diclofenac (VOLTAREN) 75 MG EC tablet, Take 1 tablet (75 mg total) by mouth 2 (two) times daily., Disp: 30 tablet, Rfl: 0   Elastic Bandages & Supports (MEDICAL COMPRESSION STOCKINGS) MISC, Use daily., Disp: 2 each, Rfl: 0   fluticasone (FLONASE) 50 MCG/ACT nasal spray, Place 2 sprays into both nostrils daily., Disp: 16 g, Rfl: 6   hyoscyamine (LEVSIN SL) 0.125 MG SL tablet, Take 2 tablets (0.25 mg total) by mouth every 6 (six) hours as needed. DISSOLVE 1 TABLET(0.125 MG) UNDER THE TONGUE EVERY 6 HOURS AS NEEDED, Disp: 120 tablet, Rfl: 3   LORazepam (ATIVAN) 1 MG tablet, Take 0.5-1 tablets (0.5-1 mg total) by mouth every 8 (eight) hours as needed for anxiety., Disp: 90 tablet, Rfl: 0   omeprazole (PRILOSEC) 20 MG  capsule, TAKE 1 CAPSULE BY MOUTH EVERY DAY AS NEEDED, Disp: , Rfl:    predniSONE (DELTASONE) 20 MG tablet, Take 1 tablet (20 mg total) by mouth 2 (two) times daily with a meal., Disp: 10 tablet, Rfl: 0   rivaroxaban (XARELTO) 20 MG TABS tablet, Take 1 tablet (20 mg total) by mouth daily with supper. Start 20 mg daily tablets after completion of 3 weeks of 15 mg BID dosing., Disp: 90 tablet, Rfl: 1   Objective:     There were no vitals filed for this visit.    There is no height or weight on file to calculate BMI.    Physical Exam:    ***   Electronically signed by:  Benito Mccreedy D.[facility] 7:42 AM 04/16/21

## 2021-04-17 ENCOUNTER — Telehealth: Payer: Self-pay | Admitting: Family Medicine

## 2021-04-17 NOTE — Telephone Encounter (Signed)
Pt called because hes still having back pain, he thought he was supped to hear something from [facility] in [place] but said he hadnt heard anything from them. He wants to know what he needs to do. Please advise. Call back [phone]

## 2021-04-18 DIAGNOSIS — M9905 Segmental and somatic dysfunction of pelvic region: Secondary | ICD-10-CM | POA: Diagnosis not present

## 2021-04-18 DIAGNOSIS — M5137 Other intervertebral disc degeneration, lumbosacral region: Secondary | ICD-10-CM | POA: Diagnosis not present

## 2021-04-18 DIAGNOSIS — M9904 Segmental and somatic dysfunction of sacral region: Secondary | ICD-10-CM | POA: Diagnosis not present

## 2021-04-18 DIAGNOSIS — M9903 Segmental and somatic dysfunction of lumbar region: Secondary | ICD-10-CM | POA: Diagnosis not present

## 2021-04-19 NOTE — Telephone Encounter (Signed)
Looks like he recently saw sam for back pain. He is already a patient at [facility] - he can probably call to schedule an appointment.  David Armstrong. [name], MD 04/19/2021 4:31 PM

## 2021-04-19 NOTE — Telephone Encounter (Signed)
See note

## 2021-04-22 DIAGNOSIS — M9905 Segmental and somatic dysfunction of pelvic region: Secondary | ICD-10-CM | POA: Diagnosis not present

## 2021-04-22 DIAGNOSIS — M9903 Segmental and somatic dysfunction of lumbar region: Secondary | ICD-10-CM | POA: Diagnosis not present

## 2021-04-22 DIAGNOSIS — M9904 Segmental and somatic dysfunction of sacral region: Secondary | ICD-10-CM | POA: Diagnosis not present

## 2021-04-22 DIAGNOSIS — M5137 Other intervertebral disc degeneration, lumbosacral region: Secondary | ICD-10-CM | POA: Diagnosis not present

## 2021-04-22 NOTE — Telephone Encounter (Signed)
Patient has appointment with PCP on Jan 12

## 2021-04-24 DIAGNOSIS — M9904 Segmental and somatic dysfunction of sacral region: Secondary | ICD-10-CM | POA: Diagnosis not present

## 2021-04-24 DIAGNOSIS — M5137 Other intervertebral disc degeneration, lumbosacral region: Secondary | ICD-10-CM | POA: Diagnosis not present

## 2021-04-24 DIAGNOSIS — M9903 Segmental and somatic dysfunction of lumbar region: Secondary | ICD-10-CM | POA: Diagnosis not present

## 2021-04-24 DIAGNOSIS — M9905 Segmental and somatic dysfunction of pelvic region: Secondary | ICD-10-CM | POA: Diagnosis not present

## 2021-04-25 ENCOUNTER — Ambulatory Visit: Payer: Medicare Other

## 2021-04-25 ENCOUNTER — Ambulatory Visit (INDEPENDENT_AMBULATORY_CARE_PROVIDER_SITE_OTHER): Payer: Medicare Other | Admitting: Family Medicine

## 2021-04-25 ENCOUNTER — Other Ambulatory Visit: Payer: Self-pay

## 2021-04-25 ENCOUNTER — Encounter: Payer: Self-pay | Admitting: Family Medicine

## 2021-04-25 VITALS — BP 115/79 | HR 86 | Temp 98.0°F | Ht 72.0 in | Wt 171.2 lb

## 2021-04-25 DIAGNOSIS — I824Z1 Acute embolism and thrombosis of unspecified deep veins of right distal lower extremity: Secondary | ICD-10-CM | POA: Diagnosis not present

## 2021-04-25 DIAGNOSIS — M5136 Other intervertebral disc degeneration, lumbar region: Secondary | ICD-10-CM | POA: Diagnosis not present

## 2021-04-25 MED ORDER — TIZANIDINE HCL 4 MG PO TABS: 4.0000 mg | ORAL_TABLET | Freq: Four times a day (QID) | ORAL | 0 refills | Status: DC | PRN

## 2021-04-25 MED ORDER — KETOROLAC TROMETHAMINE 60 MG/2ML IM SOLN
60.0000 mg | Freq: Once | INTRAMUSCULAR | Status: AC
  Administered 2021-04-25: 60 mg via INTRAMUSCULAR

## 2021-04-25 NOTE — Progress Notes (Signed)
Benito Mccreedy D.[facility] [place] Phone: [phone]   Assessment and Plan:     1. Acute bilateral low back pain without sciatica -Acute, uncomplicated, initial sports medicine visit - Likely an acute strain of lumbar paraspinal muscles based on HPI, physical exam - No red flag symptoms at today's visit and lumbar x-ray on file from 2019, so no additional imaging taken today - Start meloxicam 15 mg daily x2 weeks.  Patient is currently on Xarelto, so we will not continue NSAIDs past 2 weeks due to risk of stomach ulcer - May use Tylenol as needed for breakthrough pain - Start HEP and physical therapy.  Referral sent -Patient may start Zanaflex prescribed by PCP.  Would recommend using it as needed at night for muscle spasms  Pertinent previous records reviewed include lumbar x-ray 2019, CT abdomen pelvis 06/25/2020, PCP note from 04/25/2021   Follow Up: 3 to 4 weeks for reevaluation.  Could consider x-ray if no improvement or worsening of symptoms   Subjective:   I, Moenique Parris, am serving as a Education administrator for Doctor [name]  Chief Complaint: low back pain   HPI:   04/26/2021 Patient is a 71 year old male complaining of low back pain . Patient states that his low back is rough been going on since mid December was cleaning out his truck bad disc l5-s1 , had dvt in his right leg had a cramp in his but and gets leg cramps , hard to get in and out of his car feels like hes in a bind and cant get unstuck, hasn't been taking meds. Heat and ice dont help  Relevant Historical Information: History of DVT and PE on Xarelto, GERD  Additional pertinent review of systems negative.   Current Outpatient Medications:    betamethasone dipropionate 0.05 % cream, Apply topically 2 (two) times daily., Disp: 45 g, Rfl: 2   desonide (DESOWEN) 0.05 % lotion, APPLY TOPICALLY TO THE AFFECTED AREA EVERY DAY AS NEEDED, Disp: 118 mL,  Rfl: 2   diclofenac (VOLTAREN) 75 MG EC tablet, Take 1 tablet (75 mg total) by mouth 2 (two) times daily., Disp: 30 tablet, Rfl: 0   Elastic Bandages & Supports (MEDICAL COMPRESSION STOCKINGS) MISC, Use daily., Disp: 2 each, Rfl: 0   fluticasone (FLONASE) 50 MCG/ACT nasal spray, Place 2 sprays into both nostrils daily., Disp: 16 g, Rfl: 6   hyoscyamine (LEVSIN SL) 0.125 MG SL tablet, Take 2 tablets (0.25 mg total) by mouth every 6 (six) hours as needed. DISSOLVE 1 TABLET(0.125 MG) UNDER THE TONGUE EVERY 6 HOURS AS NEEDED, Disp: 120 tablet, Rfl: 3   LORazepam (ATIVAN) 1 MG tablet, Take 0.5-1 tablets (0.5-1 mg total) by mouth every 8 (eight) hours as needed for anxiety., Disp: 90 tablet, Rfl: 0   meloxicam (MOBIC) 15 MG tablet, Take 1 tablet (15 mg total) by mouth daily., Disp: 14 tablet, Rfl: 0   omeprazole (PRILOSEC) 20 MG capsule, TAKE 1 CAPSULE BY MOUTH EVERY DAY AS NEEDED, Disp: , Rfl:    rivaroxaban (XARELTO) 20 MG TABS tablet, Take 1 tablet (20 mg total) by mouth daily with supper. Start 20 mg daily tablets after completion of 3 weeks of 15 mg BID dosing., Disp: 90 tablet, Rfl: 1   tiZANidine (ZANAFLEX) 4 MG tablet, Take 1 tablet (4 mg total) by mouth every 6 (six) hours as needed for muscle spasms., Disp: 30 tablet, Rfl: 0   Objective:  Vitals:   04/26/21 0945  BP: 118/80  Pulse: 84  SpO2: 99%  Weight: 164 lb (74.4 kg)  Height: 6' (1.829 m)      Body mass index is 22.24 kg/m.    Physical Exam:    Gen: Appears well, nad, nontoxic and pleasant Psych: Alert and oriented, appropriate mood and affect Neuro: sensation intact, strength is 5/5 in upper and lower extremities, muscle tone wnl Skin: no susupicious lesions or rashes  Back - Normal skin, Spine with normal alignment and no deformity.   No tenderness to vertebral process palpation.   Paraspinous muscles are moderately tender bilaterally along lumbar spine and without spasm Straight leg raise negative Trendelenberg  negative    Electronically signed by:  Benito Mccreedy D.[facility] 10:13 AM 04/26/21
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
## 2021-04-25 NOTE — Patient Instructions (Signed)
It was very nice to see you today!  We will refer you back to see Dr. Georgina Snell and refer you to see physical therapy.  We will give you a toradol injection today.   Please start the muscle relaxer.  Take care, Dr [name]  PLEASE NOTE:  If you had any lab tests please let us know if you have not heard back within a few days. You may see your results on mychart before we have a chance to review them but we will give you a call once they are reviewed by [place]. If we ordered any referrals today, please let us know if you have not heard from their office within the next week.   Please try these tips to maintain a healthy lifestyle:  Eat at least 3 REAL meals and 1-2 snacks per day.  Aim for no more than 5 hours between eating.  If you eat breakfast, please do so within one hour of getting up.   Each meal should contain half fruits/vegetables, one quarter protein, and one quarter carbs (no bigger than a computer mouse)  Cut down on sweet beverages. This includes juice, soda, and sweet tea.   Drink at least 1 glass of water with each meal and aim for at least 8 glasses per day  Exercise at least 150 minutes every week.

## 2021-04-25 NOTE — Assessment & Plan Note (Signed)
Symptoms are still not controlled.  We will give 60 mg of Toradol today.  We will hold off on Depo-Medrol given he last had an injection a couple weeks ago.  He does not have any red flag signs or symptoms however given persistence of symptoms we will refer to sports medicine and physical therapy.  May need advanced imaging.  We will also refill Zanaflex.  He has been on this in the past and has done well with it.

## 2021-04-25 NOTE — Assessment & Plan Note (Signed)
Anticoagulated on Xarelto.  We will provide prolonged course of oral NSAIDs.

## 2021-04-26 ENCOUNTER — Ambulatory Visit (INDEPENDENT_AMBULATORY_CARE_PROVIDER_SITE_OTHER): Payer: Medicare Other | Admitting: Sports Medicine

## 2021-04-26 ENCOUNTER — Other Ambulatory Visit: Payer: Self-pay

## 2021-04-26 VITALS — BP 118/80 | HR 84 | Ht 72.0 in | Wt 164.0 lb

## 2021-04-26 DIAGNOSIS — M545 Low back pain, unspecified: Secondary | ICD-10-CM

## 2021-04-26 MED ORDER — MELOXICAM 15 MG PO TABS: 15.0000 mg | ORAL_TABLET | Freq: Every day | ORAL | 0 refills | Status: DC

## 2021-04-26 NOTE — Patient Instructions (Addendum)
Good to see you  Referral for PT Low back HEP Meloxicam 15 mg daily for 2 weeks Tylenol as needed for breakthrough pain  3-4 week follow up

## 2021-04-30 ENCOUNTER — Encounter: Payer: Self-pay | Admitting: Physical Therapy

## 2021-04-30 ENCOUNTER — Ambulatory Visit: Payer: Medicare Other | Attending: Family Medicine | Admitting: Physical Therapy

## 2021-04-30 ENCOUNTER — Other Ambulatory Visit: Payer: Self-pay

## 2021-04-30 DIAGNOSIS — M545 Low back pain, unspecified: Secondary | ICD-10-CM | POA: Diagnosis not present

## 2021-04-30 DIAGNOSIS — R293 Abnormal posture: Secondary | ICD-10-CM | POA: Diagnosis not present

## 2021-04-30 DIAGNOSIS — M5136 Other intervertebral disc degeneration, lumbar region: Secondary | ICD-10-CM | POA: Diagnosis not present

## 2021-04-30 DIAGNOSIS — R269 Unspecified abnormalities of gait and mobility: Secondary | ICD-10-CM | POA: Diagnosis not present

## 2021-04-30 DIAGNOSIS — M6281 Muscle weakness (generalized): Secondary | ICD-10-CM | POA: Diagnosis not present

## 2021-04-30 DIAGNOSIS — R279 Unspecified lack of coordination: Secondary | ICD-10-CM | POA: Insufficient documentation

## 2021-04-30 DIAGNOSIS — R252 Cramp and spasm: Secondary | ICD-10-CM

## 2021-04-30 NOTE — Therapy (Signed)
[facility] @ [place], [place], [address] Phone: [phone]   Fax:  [phone]  Physical Therapy Evaluation  Patient Details  Name: David Armstrong MRN: [id number] Date of Birth: [birth year] Referring Provider (PT): [name], DO   Encounter Date: 04/30/2021   PT End of Session - 04/30/21 1707     Visit Number 1    Date for PT Re-Evaluation 06/28/21    Authorization Type Medicare/Aetna    PT Start Time 1615    PT Stop Time 1700    PT Time Calculation (min) 45 min    Activity Tolerance Patient tolerated treatment well;Patient limited by pain    Behavior During Therapy [facility] for tasks assessed/performed             Past Medical History:  Diagnosis Date   Anxiety    Arthritis    "lower back; hips;' right knee"    Bipolar disorder ([place])    Chronic constipation    Depression    Diverticulosis of colon    Edema of both lower extremities    ANKLES   Fatty liver    GERD (gastroesophageal reflux disease)    Hiatal hernia    History of anal fissures    History of deep vein thrombosis (DVT) of lower extremity 02/11/2016   right lower extremity distal and proximal extensive acute   History of Helicobacter pylori infection 2012; 2009   History of kidney stones    History of pneumonia 01/2015   CAP   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    Hyperlipidemia    IBS (irritable bowel syndrome)    Left ureteral stone    Nocturia    Peripheral neuropathy    per pt due to back DDD   Personal history of colonic polyps    HYPERPLASTIC POLYP   Pulmonary nodule, right    incidental finding CT chest angio 02-11-2016 stable   TMJ (temporomandibular joint syndrome)    Varicosities of leg     Past Surgical History:  Procedure Laterality Date   BELPHAROPTOSIS REPAIR Bilateral 01/2015   CATARACT EXTRACTION W/ INTRAOCULAR LENS  IMPLANT, BILATERAL  04/2016   COLONOSCOPY WITH  ESOPHAGOGASTRODUODENOSCOPY (EGD)  last one 03-02-2013   CYSTOSCOPY/RETROGRADE/URETEROSCOPY/STONE EXTRACTION WITH BASKET  2010   CYSTOSCOPY/URETEROSCOPY/HOLMIUM LASER/STENT PLACEMENT Left 03/16/2017   Procedure: CYSTOSCOPY/RETROGRADE/URETEROSCOPY/HOLMIUM LASER/STENT PLACEMENT, STONE BASKET EXTRACTION AND STENT PLACEMENT;  Surgeon: [name], MD;  Location: [facility];  Service: Urology;  Laterality: Left;   EXCISIONAL HEMORRHOIDECTOMY  2015 approx.   EXTRACORPOREAL SHOCK WAVE LITHOTRIPSY  1987; 1992   FOOT NEUROMA SURGERY Bilateral left 09/2015; right 04/ 2017   [facility] neuroma   FOOT NEUROMA SURGERY Right 2004   mort[facility]'s neuroma and removal heel spur   HAMMER TOE SURGERY Left 2013   5th toe   INCISION AND DRAINAGE FOOT Left 1992   "ball of foot; stepped on piece of hard wire & it went thru my shoe"   KNEE ARTHROSCOPY Bilateral left x2 1997;  right 2004 & 2005   [facility]   PATELLA-FEMORAL ARTHROPLASTY Right 07-06-2003   dr Eddie Dibbles [facility]   PLANTAR FASCIA RELEASE Bilateral 1987 & 1993   RHINOPLASTY  2000   SHOULDER ARTHROSCOPY Left 2012;  2013   SHOULDER ARTHROSCOPY WITH DISTAL CLAVICLE RESECTION Right 07-31-2005   dr Percell Miller  [facility]   w/ Debridement labral and adhesions, acromioplasty, bursectomy, and CA ligament release  TRANSTHORACIC ECHOCARDIOGRAM  02/12/2016   ef 66-44%, grade 1 diastolic dysfunction/  trivial TR    There were no vitals filed for this visit.    Subjective Assessment - 04/30/21 1618     Subjective Pt reports mid to low back pain since mid DEC, cleaning out his trunk. Medication is the only thing that helps. Has been getting better since then but still feels very limited    Pertinent History History of DVT (rt leg) and PE on Xarelto, GERD,    Limitations Sitting;Walking;Standing;Lifting;House hold activities    How long can you sit comfortably? depends on the day but usually for a few minutes    How long can you stand comfortably?  5-10 mins    How long can you walk comfortably? 2 miles    Patient Stated Goals to have less pain    Currently in Pain? Yes    Pain Score 6     Pain Location Back    Pain Orientation Left;Mid    Pain Descriptors / Indicators Aching;Tightness    Pain Type Acute pain    Pain Radiating Towards n/a    Pain Onset More than a month ago    Aggravating Factors  mobility, prolonged time in one position, bending, bed mobility    Pain Relieving Factors medications                [facility] PT Assessment - 04/30/21 0001       Assessment   Medical Diagnosis M54.50 (ICD-10-CM) - Acute bilateral low back pain without sciatica    Referring Provider (PT) [name], DO    Onset Date/Surgical Date --   Dec 2022   Prior Therapy yes      Precautions   Precautions None      Restrictions   Weight Bearing Restrictions No      Balance Screen   Has the patient fallen in the past 6 months No    Has the patient had a decrease in activity level because of a fear of falling?  No    Is the patient reluctant to leave their home because of a fear of falling?  No      Home Ecologist residence    Living Arrangements Alone      Prior Function   Level of [place] Retired      Associate Professor   Overall Cognitive Status Within Functional Limits for tasks assessed      Observation/Other Assessments   Focus on Therapeutic Outcomes (FOTO)  38      Sensation   Light Touch Impaired Detail   neuropathy in bil hands     Coordination   Gross Motor Movements are Fluid and Coordinated Yes    Fine Motor Movements are Fluid and Coordinated Yes      Posture/Postural Control   Posture/Postural Control Postural limitations    Postural Limitations Rounded Shoulders;Increased thoracic kyphosis;Posterior pelvic tilt      ROM / Strength   AROM / PROM / Strength AROM;Strength      AROM   Overall AROM Comments thoracic and lumbar spine limited by 75%  in all directions, T2-10 limited with overpressure P-A extension by 75% in prone      Strength   Overall Strength Comments bil LE 4/5 throughout      Flexibility   Soft Tissue Assessment /Muscle Length yes   bil hamstrings and adductors limited by 75%  Palpation   Palpation comment TTP on bil paraspinals in thoracic and lumbar spine bil; SIJ TTP with overpressure      Special Tests    Special Tests Sacrolliac Tests    Sacroiliac Tests  Sacral Compression      Pelvic Dictraction   Findings Negative    Side  Left   bil     Pelvic Compression   Findings Negative    Side --   bil     Sacral Compression   Findings Negative                        Objective measurements completed on examination: See above findings.                PT Education - 04/30/21 1706     Education Details Pt educated on exam findings, POC, HEP    Person(s) Educated Patient    Methods Explanation;Demonstration;Tactile cues;Verbal cues;Handout    Comprehension Returned demonstration;Verbalized understanding              PT Short Term Goals - 04/30/21 1716       PT SHORT TERM GOAL #1   Title pt to be I with HEP    Time 4    Period Weeks    Status New    Target Date 05/28/21      PT SHORT TERM GOAL #2   Title pt to report no more than 4/10 pain in thoracic and lumbar spine with improved ability to stand for at least 30 mins for household tasks.    Time 4    Period Weeks    Status New    Target Date 05/28/21               PT Long Term Goals - 04/30/21 1718       PT LONG TERM GOAL #1   Title Pt to be I with advanced HEP    Time 2    Period Months    Status New    Target Date 06/28/21      PT LONG TERM GOAL #2   Title pt to report no more than 2/10 pain with back during functional tasks for ability to stand for at least 45 mins    Time 2    Period Months    Status New    Target Date 06/28/21      PT LONG TERM GOAL #3   Title pt to report at  least 44 FOTO score for improved pt's precieved functional improvement.    Time 2    Period Months    Status New    Target Date 06/28/21      PT LONG TERM GOAL #4   Title pt to demonstrate ability lift 15# from floor with good mechanics an no more than 2/10 pain for improved hip strength and stability.    Time 2    Period Months    Status New    Target Date 06/28/21                    Plan - 04/30/21 1707     Clinical Impression Statement Pt is 71 year old male complaining of low back pain since [facility] Dec 2022. Patient states that his low back is rough been going on since mid December was cleaning out his truck bad disc l5-s1 ,had DVT in his right leg had a cramp in his but and gets  leg cramps, hard to get in and out of his car feels like hes in a bind and everything is tight, reports the only thing helping him is medication but fearful to attempt a lot of mobility due to pain. Heat and ice do not help per pt. Pt reports his pain overall is a little better than in Dec but still limiting ability to walk, standing, transfer, and overall mobility. Pt tries to walk 2 miles per day but limited due to pain. Pt found to have decreased mobility in thoracic and lumbar spine in all functional directions with reported tightness, with vertebrae assessment in prone, pt had decreased mobility as well by 75%. Pt demonstrated difficulty with functional assessments of squating, transfers with need of hands on knees to ascend from chair and to fully return to standing, (-) SIJ tests. Pt given and went over HEP for gentle stretching for spinal mobility to decrease pain. Pt denied additional questions, tolerated well, and would benefit from additional PT to further address deficits and return to PLOF.    Personal Factors and Comorbidities Age;Time since onset of injury/illness/exacerbation;Comorbidity 1    Comorbidities History of DVT and PE on Xarelto, GERD, Rt knee replacement    Examination-Activity  Limitations Bed Mobility;Locomotion Level;Transfers;Stand;Stairs;Squat;Sit    Examination-Participation Restrictions Community Activity;Yard Work;Cleaning    Stability/Clinical Decision Making Stable/Uncomplicated    Clinical Decision Making Low    Rehab Potential Good    PT Frequency 2x / week    PT Duration 8 weeks    PT Treatment/Interventions ADLs/Self Care Home Management;Aquatic Therapy;Functional mobility training;Therapeutic activities;Therapeutic exercise;Moist Heat;Balance training;Neuromuscular re-education;Manual techniques;Patient/family education;Taping;Passive range of motion;Dry needling;Energy conservation;Joint Manipulations;Spinal Manipulations;Vasopneumatic Device;Cryotherapy;Occupational psychologist    PT Next Visit Plan stretching of spine and core/hip strengthening    PT Home Exercise Plan [id number]    Consulted and Agree with Plan of Care Patient             Patient will benefit from skilled therapeutic intervention in order to improve the following deficits and impairments:  Decreased coordination, Decreased endurance, Decreased strength, Decreased mobility, Postural dysfunction, Improper body mechanics, Impaired flexibility, Pain, Hypomobility, Increased muscle spasms, Increased fascial restricitons  Visit Diagnosis: Muscle weakness (generalized) - Plan: PT plan of care cert/re-cert  Lack of coordination - Plan: PT plan of care cert/re-cert  Cramp and spasm - Plan: PT plan of care cert/re-cert  Abnormality of gait and mobility - Plan: PT plan of care cert/re-cert  Abnormal posture - Plan: PT plan of care cert/re-cert  Acute bilateral low back pain without sciatica - Plan: PT plan of care cert/re-cert     Problem List Patient Active Problem List   Diagnosis Date Noted   Sleep-disordered breathing 11/30/2020   Skin lesion 11/30/2020   Headache 10/10/2020   Cataracts, bilateral 07/18/2020   Osteoarthritis 06/07/2020   DVT  (deep venous thrombosis) (HCC) 02/16/2020   Leg edema 01/13/2020   Internal hemorrhoids 02/24/2018   Dyslipidemia 02/05/2018   Seborrheic dermatitis 09/14/2017   Inguinal hernia 06/05/2017   Allergic rhinitis 04/23/2017   Lumbar degenerative disc disease 07/31/2016   History of pulmonary embolism and DVT (Oct 2017) 02/11/2016   ETD (Eustachian tube dysfunction), bilateral 02/04/2016   Chronic pansinusitis 12/19/2015   Cough, persistent 12/19/2015   Nephrolithiasis 04/19/2014   Peripheral neuropathy ([place]) 12/15/2013   IBS (irritable bowel syndrome) 03/09/2012   GERD 10/26/2007   Anxiety state 11/23/2006   Depression, major, single episode, mild ([place]) 11/23/2006   Stacy Gardner, PT, DPT 01/17/235:23 PM  [facility] @ [place] [place], [place], [address] Phone: [phone]   Fax:  [phone]  Name: David Armstrong MRN: [id number] Date of Birth: 28-Mar-1951

## 2021-05-01 DIAGNOSIS — R1084 Generalized abdominal pain: Secondary | ICD-10-CM | POA: Diagnosis not present

## 2021-05-01 DIAGNOSIS — N200 Calculus of kidney: Secondary | ICD-10-CM | POA: Diagnosis not present

## 2021-05-01 DIAGNOSIS — R3121 Asymptomatic microscopic hematuria: Secondary | ICD-10-CM | POA: Diagnosis not present

## 2021-05-02 ENCOUNTER — Ambulatory Visit: Payer: Medicare Other | Admitting: Physical Therapy

## 2021-05-02 ENCOUNTER — Encounter: Payer: Self-pay | Admitting: Physical Therapy

## 2021-05-02 ENCOUNTER — Other Ambulatory Visit: Payer: Self-pay

## 2021-05-02 DIAGNOSIS — R279 Unspecified lack of coordination: Secondary | ICD-10-CM

## 2021-05-02 DIAGNOSIS — M6281 Muscle weakness (generalized): Secondary | ICD-10-CM | POA: Diagnosis not present

## 2021-05-02 DIAGNOSIS — R293 Abnormal posture: Secondary | ICD-10-CM

## 2021-05-02 DIAGNOSIS — R269 Unspecified abnormalities of gait and mobility: Secondary | ICD-10-CM | POA: Diagnosis not present

## 2021-05-02 DIAGNOSIS — M545 Low back pain, unspecified: Secondary | ICD-10-CM

## 2021-05-02 DIAGNOSIS — M5136 Other intervertebral disc degeneration, lumbar region: Secondary | ICD-10-CM | POA: Diagnosis not present

## 2021-05-02 NOTE — Therapy (Signed)
[facility] @ [place], [place], [address] Phone: [phone]   Fax:  [phone]  Physical Therapy Treatment  Patient Details  Name: David Armstrong MRN: [id number] Date of Birth: [birth year] Referring Provider (PT): [name], DO   Encounter Date: 05/02/2021   PT End of Session - 05/02/21 1541     Visit Number 2    Date for PT Re-Evaluation 06/28/21    Authorization Type Medicare/Aetna    PT Start Time 1445    PT Stop Time 8127    PT Time Calculation (min) 45 min    Activity Tolerance Patient tolerated treatment well    Behavior During Therapy [facility] for tasks assessed/performed             Past Medical History:  Diagnosis Date   Anxiety    Arthritis    "lower back; hips;' right knee"    Bipolar disorder ([place])    Chronic constipation    Depression    Diverticulosis of colon    Edema of both lower extremities    ANKLES   Fatty liver    GERD (gastroesophageal reflux disease)    Hiatal hernia    History of anal fissures    History of deep vein thrombosis (DVT) of lower extremity 02/11/2016   right lower extremity distal and proximal extensive acute   History of Helicobacter pylori infection 2012; 2009   History of kidney stones    History of pneumonia 01/2015   CAP   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    Hyperlipidemia    IBS (irritable bowel syndrome)    Left ureteral stone    Nocturia    Peripheral neuropathy    per pt due to back DDD   Personal history of colonic polyps    HYPERPLASTIC POLYP   Pulmonary nodule, right    incidental finding CT chest angio 02-11-2016 stable   TMJ (temporomandibular joint syndrome)    Varicosities of leg     Past Surgical History:  Procedure Laterality Date   BELPHAROPTOSIS REPAIR Bilateral 01/2015   CATARACT EXTRACTION W/ INTRAOCULAR LENS  IMPLANT, BILATERAL  04/2016   COLONOSCOPY WITH ESOPHAGOGASTRODUODENOSCOPY  (EGD)  last one 03-02-2013   CYSTOSCOPY/RETROGRADE/URETEROSCOPY/STONE EXTRACTION WITH BASKET  2010   CYSTOSCOPY/URETEROSCOPY/HOLMIUM LASER/STENT PLACEMENT Left 03/16/2017   Procedure: CYSTOSCOPY/RETROGRADE/URETEROSCOPY/HOLMIUM LASER/STENT PLACEMENT, STONE BASKET EXTRACTION AND STENT PLACEMENT;  Surgeon: [name], MD;  Location: [facility];  Service: Urology;  Laterality: Left;   EXCISIONAL HEMORRHOIDECTOMY  2015 approx.   EXTRACORPOREAL SHOCK WAVE LITHOTRIPSY  1987; 1992   FOOT NEUROMA SURGERY Bilateral left 09/2015; right 04/ 2017   [facility]' neuroma   FOOT NEUROMA SURGERY Right 2004   morton's neuroma and removal heel spur   HAMMER TOE SURGERY Left 2013   5th toe   INCISION AND DRAINAGE FOOT Left 1992   "ball of foot; stepped on piece of hard wire & it went thru my shoe"   KNEE ARTHROSCOPY Bilateral left x2 1997;  right 2004 & 2005   [facility]   PATELLA-FEMORAL ARTHROPLASTY Right 07-06-2003   dr Eddie Dibbles [facility]   PLANTAR FASCIA RELEASE Bilateral 1987 & 1993   RHINOPLASTY  2000   SHOULDER ARTHROSCOPY Left 2012;  2013   SHOULDER ARTHROSCOPY WITH DISTAL CLAVICLE RESECTION Right 07-31-2005   dr Percell Miller  [facility]   w/ Debridement labral and adhesions, acromioplasty, bursectomy, and CA ligament release   TRANSTHORACIC ECHOCARDIOGRAM  02/12/2016   ef 09-81%, grade 1 diastolic dysfunction/  trivial TR    There were no vitals filed for this visit.   Subjective Assessment - 05/02/21 1448     Subjective I am maybe having less pain.  I can turn over in bed with less pain now.  I tried the HEP 1x since I was here and it went ok.  I was just told by urologist yesterday that I have 5 kidney stones in Lt kidney.    Pertinent History History of DVT (rt leg) and PE on Xarelto, GERD,    Limitations Sitting;Walking;Standing;Lifting;House hold activities    How long can you sit comfortably? depends on the day but usually for a few minutes    How long can you stand  comfortably? 5-10 mins    How long can you walk comfortably? 2 miles    Patient Stated Goals to have less pain    Currently in Pain? Yes    Pain Score 5     Pain Location Back    Pain Orientation Left    Pain Descriptors / Indicators Aching;Tightness    Pain Type Acute pain    Pain Onset More than a month ago    Pain Relieving Factors meds                               [facility] Adult PT Treatment/Exercise - 05/02/21 0001       Exercises   Exercises Lumbar;Knee/Hip;Shoulder      Lumbar Exercises: Stretches   Single Knee to Chest Stretch Left;Right;5 reps    Single Knee to Chest Stretch Limitations hold 5 sec    Lower Trunk Rotation 5 reps    Lower Trunk Rotation Limitations pause 3 sec, Pt demo good ROM to both sides    Other Lumbar Stretch Exercise QL 3x10 each way overhead reach in doorframe   Pt reported mild aggravation of pain following this   Other Lumbar Stretch Exercise seated ball rollouts 5x5" for lumbar flexion      Lumbar Exercises: Supine   Bridge 5 reps      Knee/Hip Exercises: Stretches   Active Hamstring Stretch Left;Right;3 reps;10 seconds    Active Hamstring Stretch Limitations seated, ongoing cueing to keep back straight    Hip Flexor Stretch Left;Right;5 reps    Hip Flexor Stretch Limitations foot on 2nd step rock in/out, hold 5"      Knee/Hip Exercises: Standing   Walking with Sports Cord bwd walking x 10 green tband in row at sides hold 5", PT TC and VC for posture      Shoulder Exercises: Standing   Extension Strengthening;10 reps;Theraband    Theraband Level (Shoulder Extension) Level 3 (Green)    Extension Limitations hold 5" for postural cue and TA awarness    Row Strengthening;Both;10 reps;Theraband    Theraband Level (Shoulder Row) Level 3 (Green)      Shoulder Exercises: ROM/Strengthening   Wall Pushups 10 reps    Wall Pushups Limitations TC for TA, VC for exhale on press phase for pressure management      Manual Therapy    Manual Therapy Soft tissue mobilization;Joint mobilization    Joint Mobilization thoracic PAs and rib springing Gr II/III (Pt did not tolerate being prone so d/c'd after 2-3')    Soft tissue mobilization Lt thoracic paraspinals and QL in Rt SL (Pt has Rt shoulder pain so d/c'd after 2-3')  PT Short Term Goals - 04/30/21 1716       PT SHORT TERM GOAL #1   Title pt to be I with HEP    Time 4    Period Weeks    Status New    Target Date 05/28/21      PT SHORT TERM GOAL #2   Title pt to report no more than 4/10 pain in thoracic and lumbar spine with improved ability to stand for at least 30 mins for household tasks.    Time 4    Period Weeks    Status New    Target Date 05/28/21               PT Long Term Goals - 04/30/21 1718       PT LONG TERM GOAL #1   Title Pt to be I with advanced HEP    Time 2    Period Months    Status New    Target Date 06/28/21      PT LONG TERM GOAL #2   Title pt to report no more than 2/10 pain with back during functional tasks for ability to stand for at least 45 mins    Time 2    Period Months    Status New    Target Date 06/28/21      PT LONG TERM GOAL #3   Title pt to report at least 48 FOTO score for improved pt's precieved functional improvement.    Time 2    Period Months    Status New    Target Date 06/28/21      PT LONG TERM GOAL #4   Title pt to demonstrate ability lift 15# from floor with good mechanics an no more than 2/10 pain for improved hip strength and stability.    Time 2    Period Months    Status New    Target Date 06/28/21                   Plan - 05/02/21 1541     Clinical Impression Statement Pt appeared to have much improved mobility as compared to initial evaluation today.  He did get diagnosed yesterday with 5 kidney stones in Lt kidney, but given onset of back pain linked to physical activity in Dec unsure whether this could be contributing to pain pattern.   Pt does not have a positional or directional preference.  He demo'd standing trunk ROM in all planes WFL with mild discomfort along thoracic paraspinals with bil SB.  Flexion was full but Pt used hands on thighs to return to stand over fear of back pain.  He did not appear to have any muscle spasm along paraspinals but is pretty rigid throughout thoracic spine.  PT attempted prone and Rt SL manual techniques but Pt had some abdominal discomfort and Rt shoulder pain which prevented these positions for more than 2-3'.  PT reviewed HEP from initial visits and progressed some hip flexor stretching and standing trunk strength through UEs which was well tolerated.  Pt did not want more ther ex added to his HEP today but PT stated that if today was well tolerated he would benefit from strength at home.  PT cued TA, postural alignment and breathwork to manage pressures with standing ther ex today.  Pt a good candidate to continue working on mobility and strength.    Rehab Potential Good    PT Frequency 2x / week  PT Duration 8 weeks    PT Treatment/Interventions ADLs/Self Care Home Management;Aquatic Therapy;Functional mobility training;Therapeutic activities;Therapeutic exercise;Moist Heat;Balance training;Neuromuscular re-education;Manual techniques;Patient/family education;Taping;Passive range of motion;Dry needling;Energy conservation;Joint Manipulations;Spinal Manipulations;Vasopneumatic Device;Cryotherapy;Occupational psychologist    PT Next Visit Plan stretching of spine and core/hip strengthening    PT Home Exercise Plan [facility]    Consulted and Agree with Plan of Care Patient             Patient will benefit from skilled therapeutic intervention in order to improve the following deficits and impairments:     Visit Diagnosis: Muscle weakness (generalized)  Lack of coordination  Abnormal posture  Acute bilateral low back pain without sciatica     Problem  List Patient Active Problem List   Diagnosis Date Noted   Sleep-disordered breathing 11/30/2020   Skin lesion 11/30/2020   Headache 10/10/2020   Cataracts, bilateral 07/18/2020   Osteoarthritis 06/07/2020   DVT (deep venous thrombosis) ([place]) 02/16/2020   Leg edema 01/13/2020   Internal hemorrhoids 02/24/2018   Dyslipidemia 02/05/2018   Seborrheic dermatitis 09/14/2017   Inguinal hernia 06/05/2017   Allergic rhinitis 04/23/2017   Lumbar degenerative disc disease 07/31/2016   History of pulmonary embolism and DVT (Oct 2017) 02/11/2016   ETD (Eustachian tube dysfunction), bilateral 02/04/2016   Chronic pansinusitis 12/19/2015   Cough, persistent 12/19/2015   Nephrolithiasis 04/19/2014   Peripheral neuropathy ([place]) 12/15/2013   IBS (irritable bowel syndrome) 03/09/2012   GERD 10/26/2007   Anxiety state 11/23/2006   Depression, major, single episode, mild ([place]) 11/23/2006    Galileo Colello, PT 05/02/21 3:48 PM  [facility] @ [place], [place], [address] Phone: [phone]   Fax:  [phone]  Name: David Armstrong MRN: [id number] Date of Birth: March 22, 1951

## 2021-05-07 ENCOUNTER — Other Ambulatory Visit: Payer: Self-pay

## 2021-05-07 ENCOUNTER — Ambulatory Visit: Payer: Medicare Other | Admitting: Physical Therapy

## 2021-05-07 DIAGNOSIS — M6281 Muscle weakness (generalized): Secondary | ICD-10-CM

## 2021-05-07 DIAGNOSIS — M5136 Other intervertebral disc degeneration, lumbar region: Secondary | ICD-10-CM | POA: Diagnosis not present

## 2021-05-07 DIAGNOSIS — R293 Abnormal posture: Secondary | ICD-10-CM | POA: Diagnosis not present

## 2021-05-07 DIAGNOSIS — M545 Low back pain, unspecified: Secondary | ICD-10-CM

## 2021-05-07 DIAGNOSIS — R269 Unspecified abnormalities of gait and mobility: Secondary | ICD-10-CM | POA: Diagnosis not present

## 2021-05-07 DIAGNOSIS — R279 Unspecified lack of coordination: Secondary | ICD-10-CM

## 2021-05-07 NOTE — Therapy (Signed)
[facility] [place], [place], [address] Phone: [phone]   Fax:  [phone]  Physical Therapy Treatment  Patient Details  Name: David Armstrong MRN: [id number] Date of Birth: [birth year] Referring Provider (PT): [name], DO   Encounter Date: 05/07/2021   PT End of Session - 05/07/21 0922     Visit Number 3    Date for PT Re-Evaluation 06/28/21    Authorization Type Medicare/Aetna    PT Start Time 0842    PT Stop Time 0923    PT Time Calculation (min) 41 min    Activity Tolerance Patient tolerated treatment well             Past Medical History:  Diagnosis Date   Anxiety    Arthritis    "lower back; hips;' right knee"    Bipolar disorder ([place])    Chronic constipation    Depression    Diverticulosis of colon    Edema of both lower extremities    ANKLES   Fatty liver    GERD (gastroesophageal reflux disease)    Hiatal hernia    History of anal fissures    History of deep vein thrombosis (DVT) of lower extremity 02/11/2016   right lower extremity distal and proximal extensive acute   History of Helicobacter pylori infection 2012; 2009   History of kidney stones    History of pneumonia 01/2015   CAP   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    Hyperlipidemia    IBS (irritable bowel syndrome)    Left ureteral stone    Nocturia    Peripheral neuropathy    per pt due to back DDD   Personal history of colonic polyps    HYPERPLASTIC POLYP   Pulmonary nodule, right    incidental finding CT chest angio 02-11-2016 stable   TMJ (temporomandibular joint syndrome)    Varicosities of leg     Past Surgical History:  Procedure Laterality Date   BELPHAROPTOSIS REPAIR Bilateral 01/2015   CATARACT EXTRACTION W/ INTRAOCULAR LENS  IMPLANT, BILATERAL  04/2016   COLONOSCOPY WITH ESOPHAGOGASTRODUODENOSCOPY (EGD)  last one 03-02-2013    CYSTOSCOPY/RETROGRADE/URETEROSCOPY/STONE EXTRACTION WITH BASKET  2010   CYSTOSCOPY/URETEROSCOPY/HOLMIUM LASER/STENT PLACEMENT Left 03/16/2017   Procedure: CYSTOSCOPY/RETROGRADE/URETEROSCOPY/HOLMIUM LASER/STENT PLACEMENT, STONE BASKET EXTRACTION AND STENT PLACEMENT;  Surgeon: [name], MD;  Location: [facility];  Service: Urology;  Laterality: Left;   EXCISIONAL HEMORRHOIDECTOMY  2015 approx.   EXTRACORPOREAL SHOCK WAVE LITHOTRIPSY  1987; 1992   FOOT NEUROMA SURGERY Bilateral left 09/2015; right 04/ 2017   morton' neuroma   FOOT NEUROMA SURGERY Right 2004   morton's neuroma and removal heel spur   HAMMER TOE SURGERY Left 2013   5th toe   INCISION AND DRAINAGE FOOT Left 1992   "ball of foot; stepped on piece of hard wire & it went thru my shoe"   KNEE ARTHROSCOPY Bilateral left x2 1997;  right 2004 & 2005   [place]   PATELLA-FEMORAL ARTHROPLASTY Right 07-06-2003   dr Eddie Dibbles [facility]   PLANTAR FASCIA RELEASE Bilateral 1987 & 1993   RHINOPLASTY  2000   SHOULDER ARTHROSCOPY Left 2012;  2013   SHOULDER ARTHROSCOPY WITH DISTAL CLAVICLE RESECTION Right 07-31-2005   dr Percell Miller  [facility]   w/ Debridement labral and adhesions, acromioplasty, bursectomy, and CA ligament release   TRANSTHORACIC ECHOCARDIOGRAM  02/12/2016   ef 15-05%, grade 1 diastolic dysfunction/  trivial TR    There were no vitals filed for this visit.   Subjective Assessment - 05/07/21 0846     Subjective The pain is there but not like it was at the beginning.  It doesn't hurt to turn over in bed or bend over now.    Pertinent History History of DVT (rt leg) and PE on Xarelto, GERD,    Limitations Sitting;Walking;Standing;Lifting;House hold activities    How long can you sit comfortably? depends on the day but usually for a few minutes    How long can you stand comfortably? 5-10 mins    How long can you walk comfortably? 2 miles    Patient Stated Goals to have less pain    Currently in Pain?  Yes    Pain Score 5     Pain Location Back    Pain Orientation Left    Pain Type Acute pain    Pain Onset More than a month ago                               [facility] Adult PT Treatment/Exercise - 05/07/21 0001       Lumbar Exercises: Stretches   Other Lumbar Stretch Exercise 2nd step hip flexor stretch with UE elevation 5x each side    Other Lumbar Stretch Exercise at the sink trunk flexion to neutral rocking 10x      Lumbar Exercises: Aerobic   Nustep L2 5 min while discussing status      Lumbar Exercises: Standing   Shoulder Extension Both;Strengthening;10 reps    Theraband Level (Shoulder Extension) Level 3 (Green)    Other Standing Lumbar Exercises 2 8# partial dead lifts 10x    Other Standing Lumbar Exercises pallof style holding green band out while marching and touch back 10x each      Lumbar Exercises: Seated   Sit to Stand 10 reps    Sit to Stand Limitations holding 8#    Other Seated Lumbar Exercises 8# chair sit ups  10x                      PT Short Term Goals - 04/30/21 1716       PT SHORT TERM GOAL #1   Title pt to be I with HEP    Time 4    Period Weeks    Status New    Target Date 05/28/21      PT SHORT TERM GOAL #2   Title pt to report no more than 4/10 pain in thoracic and lumbar spine with improved ability to stand for at least 30 mins for household tasks.    Time 4    Period Weeks    Status New    Target Date 05/28/21               PT Long Term Goals - 04/30/21 1718       PT LONG TERM GOAL #1   Title Pt to be I with advanced HEP    Time 2    Period Months    Status New    Target Date 06/28/21      PT LONG TERM GOAL #2   Title pt to report no more than 2/10 pain with back during functional tasks for ability to stand for at least 45 mins    Time 2    Period Months    Status New  Target Date 06/28/21      PT LONG TERM GOAL #3   Title pt to report at least 37 FOTO score for improved pt's  precieved functional improvement.    Time 2    Period Months    Status New    Target Date 06/28/21      PT LONG TERM GOAL #4   Title pt to demonstrate ability lift 15# from floor with good mechanics an no more than 2/10 pain for improved hip strength and stability.    Time 2    Period Months    Status New    Target Date 06/28/21                   Plan - 05/07/21 7342     Clinical Impression Statement The patient is able to continue with spinal mobility ex's in the standing position and progress core and LE strength ex's.  Sit to stand with a weight was challenging from a standard height chair.  Difficulty balancing with Pallof style ex's.  He reports he can feel "he worked the muscles in his back" but overall he feels better at the end of the treatment session.  Therapist providing cues for ex technique including hip hinge with dead lifts.    Personal Factors and Comorbidities Age;Time since onset of injury/illness/exacerbation;Comorbidity 1    Comorbidities History of DVT and PE on Xarelto, GERD, Rt knee replacement    Examination-Activity Limitations Bed Mobility;Locomotion Level;Transfers;Stand;Stairs;Squat;Sit    Examination-Participation Restrictions Community Activity;Yard Work;Cleaning    Stability/Clinical Decision Making Stable/Uncomplicated    Rehab Potential Good    PT Frequency 2x / week    PT Duration 8 weeks    PT Treatment/Interventions ADLs/Self Care Home Management;Aquatic Therapy;Functional mobility training;Therapeutic activities;Therapeutic exercise;Moist Heat;Balance training;Neuromuscular re-education;Manual techniques;Patient/family education;Taping;Passive range of motion;Dry needling;Energy conservation;Joint Manipulations;Spinal Manipulations;Vasopneumatic Device;Cryotherapy;Electrical Stimulation;Gait training;Stair training    PT Next Visit Plan stretching of spine and core/hip strengthening; partial dead lifts; sit to stands; Pallof; Nu-Step    PT  Home Exercise Plan [facility]             Patient will benefit from skilled therapeutic intervention in order to improve the following deficits and impairments:  Decreased coordination, Decreased endurance, Decreased strength, Decreased mobility, Postural dysfunction, Improper body mechanics, Impaired flexibility, Pain, Hypomobility, Increased muscle spasms, Increased fascial restricitons  Visit Diagnosis: Muscle weakness (generalized)  Lack of coordination  Abnormal posture  Acute bilateral low back pain without sciatica     Problem List Patient Active Problem List   Diagnosis Date Noted   Sleep-disordered breathing 11/30/2020   Skin lesion 11/30/2020   Headache 10/10/2020   Cataracts, bilateral 07/18/2020   Osteoarthritis 06/07/2020   DVT (deep venous thrombosis) (HCC) 02/16/2020   Leg edema 01/13/2020   Internal hemorrhoids 02/24/2018   Dyslipidemia 02/05/2018   Seborrheic dermatitis 09/14/2017   Inguinal hernia 06/05/2017   Allergic rhinitis 04/23/2017   Lumbar degenerative disc disease 07/31/2016   History of pulmonary embolism and DVT (Oct 2017) 02/11/2016   ETD (Eustachian tube dysfunction), bilateral 02/04/2016   Chronic pansinusitis 12/19/2015   Cough, persistent 12/19/2015   Nephrolithiasis 04/19/2014   Peripheral neuropathy ([place]) 12/15/2013   IBS (irritable bowel syndrome) 03/09/2012   GERD 10/26/2007   Anxiety state 11/23/2006   Depression, major, single episode, mild ([place]) 11/23/2006   Ruben Im, PT 05/07/21 9:47 AM Phone: [phone] Fax: [phone]  Alvera Singh, PT 05/07/2021, 9:47 AM  [facility] [place]  [place], [place], [address] Phone: [phone]   Fax:  [phone]  Name: CLEARNCE LEJA MRN: [id number] Date of Birth: May 03, 1950

## 2021-05-09 DIAGNOSIS — M545 Low back pain, unspecified: Secondary | ICD-10-CM | POA: Diagnosis not present

## 2021-05-09 DIAGNOSIS — M542 Cervicalgia: Secondary | ICD-10-CM | POA: Diagnosis not present

## 2021-05-09 DIAGNOSIS — R519 Headache, unspecified: Secondary | ICD-10-CM | POA: Diagnosis not present

## 2021-05-09 DIAGNOSIS — G603 Idiopathic progressive neuropathy: Secondary | ICD-10-CM | POA: Diagnosis not present

## 2021-05-09 DIAGNOSIS — Z79899 Other long term (current) drug therapy: Secondary | ICD-10-CM | POA: Diagnosis not present

## 2021-05-09 DIAGNOSIS — M5481 Occipital neuralgia: Secondary | ICD-10-CM | POA: Diagnosis not present

## 2021-05-14 ENCOUNTER — Encounter: Payer: Self-pay | Admitting: Family Medicine

## 2021-05-14 ENCOUNTER — Other Ambulatory Visit: Payer: Self-pay

## 2021-05-14 ENCOUNTER — Telehealth (INDEPENDENT_AMBULATORY_CARE_PROVIDER_SITE_OTHER): Payer: Medicare Other | Admitting: Family Medicine

## 2021-05-14 ENCOUNTER — Ambulatory Visit: Payer: Medicare Other | Admitting: Physical Therapy

## 2021-05-14 VITALS — Ht 72.0 in | Wt 164.0 lb

## 2021-05-14 DIAGNOSIS — U071 COVID-19: Secondary | ICD-10-CM

## 2021-05-14 MED ORDER — MOLNUPIRAVIR 200 MG PO CAPS: 4.0000 | ORAL_CAPSULE | Freq: Two times a day (BID) | ORAL | 0 refills | Status: AC

## 2021-05-14 NOTE — Progress Notes (Signed)
° °  David Armstrong is a 71 y.o. male who presents today for a telephone visit.  Assessment/Plan:  COVID No red flags.  No signs of respiratory distress.  We will start [place].  We will avoid paxlovid due to medication interactions.  Encouraged hydration.  Discussed reasons to return to care.  Follow-up as needed.    Subjective:  HPI:  Patient here with covid. Symptoms started 4 days ago. [facility] and [facility] which came back positive yesterday. Symptoms are currently manageable. Some sore throat and congestion. No chest pain or shortness of breath. No sick contacts.        Objective/Observations   NAD  Telephone Visit   I connected with David Armstrong on 05/14/21 at  3:40 PM EST via telephone and verified that I am speaking with the correct person using two identifiers. I discussed the limitations of evaluation and management by telemedicine and the availability of in person appointments. The patient expressed understanding and agreed to proceed.   Patient location: Home Provider location: [place] participating in the virtual visit: Myself and Patient  A total of 18 minutes were spent on medical discussion.      [name]. [name], MD 05/14/2021 11:46 AM

## 2021-05-16 ENCOUNTER — Encounter: Payer: Medicare Other | Admitting: Physical Therapy

## 2021-05-17 ENCOUNTER — Ambulatory Visit: Payer: Medicare Other | Admitting: Sports Medicine

## 2021-05-20 ENCOUNTER — Other Ambulatory Visit: Payer: Self-pay | Admitting: Sports Medicine

## 2021-05-20 NOTE — Telephone Encounter (Signed)
Patient called asking if another provider could fill this for him while Dr [name] is out of the office?

## 2021-05-20 NOTE — Telephone Encounter (Signed)
Pt called & rsch appt from 2/3 & would like a refill of meloxicam to get him by to 2/14.

## 2021-05-21 ENCOUNTER — Ambulatory Visit: Payer: Medicare Other | Admitting: Physician Assistant

## 2021-05-21 ENCOUNTER — Telehealth: Payer: Self-pay | Admitting: Family Medicine

## 2021-05-21 ENCOUNTER — Telehealth: Payer: Medicare Other | Admitting: Family Medicine

## 2021-05-21 ENCOUNTER — Other Ambulatory Visit: Payer: Self-pay

## 2021-05-21 ENCOUNTER — Telehealth (INDEPENDENT_AMBULATORY_CARE_PROVIDER_SITE_OTHER): Payer: Medicare Other | Admitting: Physician Assistant

## 2021-05-21 ENCOUNTER — Ambulatory Visit: Payer: Medicare Other | Attending: Family Medicine | Admitting: Physical Therapy

## 2021-05-21 ENCOUNTER — Encounter: Payer: Self-pay | Admitting: Physician Assistant

## 2021-05-21 DIAGNOSIS — M545 Low back pain, unspecified: Secondary | ICD-10-CM | POA: Diagnosis not present

## 2021-05-21 DIAGNOSIS — R293 Abnormal posture: Secondary | ICD-10-CM | POA: Diagnosis not present

## 2021-05-21 DIAGNOSIS — M6281 Muscle weakness (generalized): Secondary | ICD-10-CM | POA: Diagnosis not present

## 2021-05-21 DIAGNOSIS — M5136 Other intervertebral disc degeneration, lumbar region: Secondary | ICD-10-CM

## 2021-05-21 MED ORDER — PREDNISONE 50 MG PO TABS: ORAL_TABLET | ORAL | 0 refills | Status: DC

## 2021-05-21 NOTE — Progress Notes (Unsigned)
° °   David Armstrong D.[place] Phone: [phone]   Assessment and Plan:     There are no diagnoses linked to this encounter.  ***   Pertinent previous records reviewed include ***   Follow Up: ***     Subjective:   I, David Armstrong, am serving as a Education administrator for David Armstrong  Chief Complaint: low back pain   HPI:  04/26/2021 Patient is a 71 year old male complaining of low back pain . Patient states that his low back is rough been going on since mid December was cleaning out his truck bad disc l5-s1 , had dvt in his right leg had a cramp in his but and gets leg cramps , hard to get in and out of his car feels like hes in a bind and cant get unstuck, hasn't been taking meds. Heat and ice dont help  05/28/2021 Patient states       Relevant Historical Information: History of DVT and PE on Xarelto, GERD  Additional pertinent review of systems negative.   Current Outpatient Medications:    betamethasone dipropionate 0.05 % cream, Apply topically 2 (two) times daily., Disp: 45 g, Rfl: 2   desonide (DESOWEN) 0.05 % lotion, APPLY TOPICALLY TO THE AFFECTED AREA EVERY DAY AS NEEDED, Disp: 118 mL, Rfl: 2   Elastic Bandages & Supports (MEDICAL COMPRESSION STOCKINGS) MISC, Use daily., Disp: 2 each, Rfl: 0   fluticasone (FLONASE) 50 MCG/ACT nasal spray, Place 2 sprays into both nostrils daily., Disp: 16 g, Rfl: 6   hyoscyamine (LEVSIN SL) 0.125 MG SL tablet, Take 2 tablets (0.25 mg total) by mouth every 6 (six) hours as needed. DISSOLVE 1 TABLET(0.125 MG) UNDER THE TONGUE EVERY 6 HOURS AS NEEDED, Disp: 120 tablet, Rfl: 3   LORazepam (ATIVAN) 1 MG tablet, Take 0.5-1 tablets (0.5-1 mg total) by mouth every 8 (eight) hours as needed for anxiety., Disp: 90 tablet, Rfl: 0   meloxicam (MOBIC) 15 MG tablet, Take 1 tablet (15 mg total) by mouth daily., Disp: 14 tablet, Rfl: 0   omeprazole (PRILOSEC) 20 MG capsule,  TAKE 1 CAPSULE BY MOUTH EVERY DAY AS NEEDED, Disp: , Rfl:    rivaroxaban (XARELTO) 20 MG TABS tablet, Take 1 tablet (20 mg total) by mouth daily with supper. Start 20 mg daily tablets after completion of 3 weeks of 15 mg BID dosing., Disp: 90 tablet, Rfl: 1   tiZANidine (ZANAFLEX) 4 MG tablet, Take 1 tablet (4 mg total) by mouth every 6 (six) hours as needed for muscle spasms., Disp: 30 tablet, Rfl: 0   Objective:     There were no vitals filed for this visit.    There is no height or weight on file to calculate BMI.    Physical Exam:    ***   Electronically signed by:  David Armstrong D.[facility] 8:46 AM 05/21/21

## 2021-05-21 NOTE — Telephone Encounter (Signed)
I am covering for Dr. [name] while he is away. I declined to refill your meloxicam prescription.  This medication is not considered safe to take with Xarelto and certainly not safe to take longer than 2 weeks.  I would recommend to take Tylenol instead.  If your pain is not controlled and you need a follow-up visit I am happy to see you in clinic.

## 2021-05-21 NOTE — Progress Notes (Signed)
I acted as a Education administrator for [employer], PA-C Anselmo Pickler, LPN  Virtual Visit via Video Note   I, , connected with  David Armstrong  ([id number], 1950/09/26) on 05/21/21 at  1:00 PM EST by a video-enabled telemedicine application and verified that I am speaking with the correct person using two identifiers.  Location: Patient: Home Provider: [place] office   I discussed the limitations of evaluation and management by telemedicine and the availability of in person appointments. The patient expressed understanding and agreed to proceed.    History of Present Illness: David Armstrong is a 70 y.o. who identifies as a male who was assigned male at birth, and is being seen today for back pain. Pt c/o mid back pain off and on x 2 months.   He went to PT today. States that his back went out this morning when trying to put on his compression socks. Has trialed zanaflex with limited success.  He was given mobic 15 mg during his sports medicine visit with Dr. [name] on 04/26/21. He took x 2 weeks and had good relief of his symptoms however he was not given more than this due to use of xarelto.  Patient continues on xarelto 20 mg daily.   Denies: severe worsening of pain, inability to walk, saddle anesthesia, radiation of pain, fever, urinary symptoms    Problems:  Patient Active Problem List   Diagnosis Date Noted   Sleep-disordered breathing 11/30/2020   Skin lesion 11/30/2020   Headache 10/10/2020   Cataracts, bilateral 07/18/2020   Osteoarthritis 06/07/2020   DVT (deep venous thrombosis) ([place]) 02/16/2020   Leg edema 01/13/2020   Internal hemorrhoids 02/24/2018   Dyslipidemia 02/05/2018   Seborrheic dermatitis 09/14/2017   Inguinal hernia 06/05/2017   Allergic rhinitis 04/23/2017   Lumbar degenerative disc disease 07/31/2016   History of pulmonary embolism and DVT (Oct 2017) 02/11/2016   ETD (Eustachian tube dysfunction), bilateral 02/04/2016   Chronic  pansinusitis 12/19/2015   Cough, persistent 12/19/2015   Nephrolithiasis 04/19/2014   Peripheral neuropathy ([place]) 12/15/2013   IBS (irritable bowel syndrome) 03/09/2012   GERD 10/26/2007   Anxiety state 11/23/2006   Depression, major, single episode, mild ([place]) 11/23/2006    Allergies:  Allergies  Allergen Reactions   Effexor Xr [Venlafaxine Hcl Er] Other (See Comments)    Insomnia   Acyclovir And Related    Zocor [Simvastatin] Other (See Comments)    Muscle ache/ pain   Dexilant [Dexlansoprazole] Other (See Comments)    Muscle aches   Ivp Dye [Iodinated Contrast Media] Rash   Soma [Carisoprodol] Other (See Comments)    Sores on arm   Sulfa Antibiotics Rash   Medications:  Current Outpatient Medications:    betamethasone dipropionate 0.05 % cream, Apply topically 2 (two) times daily., Disp: 45 g, Rfl: 2   desonide (DESOWEN) 0.05 % lotion, APPLY TOPICALLY TO THE AFFECTED AREA EVERY DAY AS NEEDED, Disp: 118 mL, Rfl: 2   Elastic Bandages & Supports (MEDICAL COMPRESSION STOCKINGS) MISC, Use daily., Disp: 2 each, Rfl: 0   fluticasone (FLONASE) 50 MCG/ACT nasal spray, Place 2 sprays into both nostrils daily., Disp: 16 g, Rfl: 6   hyoscyamine (LEVSIN SL) 0.125 MG SL tablet, Take 2 tablets (0.25 mg total) by mouth every 6 (six) hours as needed. DISSOLVE 1 TABLET(0.125 MG) UNDER THE TONGUE EVERY 6 HOURS AS NEEDED, Disp: 120 tablet, Rfl: 3   LORazepam (ATIVAN) 1 MG tablet, Take 0.5-1 tablets (0.5-1 mg total) by  mouth every 8 (eight) hours as needed for anxiety., Disp: 90 tablet, Rfl: 0   meloxicam (MOBIC) 15 MG tablet, Take 1 tablet (15 mg total) by mouth daily., Disp: 14 tablet, Rfl: 0   omeprazole (PRILOSEC) 20 MG capsule, TAKE 1 CAPSULE BY MOUTH EVERY DAY AS NEEDED, Disp: , Rfl:    predniSONE (DELTASONE) 50 MG tablet, Take 1 tablet daily x 5 days, Disp: 5 tablet, Rfl: 0   rivaroxaban (XARELTO) 20 MG TABS tablet, Take 1 tablet (20 mg total) by mouth daily with supper. Start 20 mg daily  tablets after completion of 3 weeks of 15 mg BID dosing., Disp: 90 tablet, Rfl: 1   tiZANidine (ZANAFLEX) 4 MG tablet, Take 1 tablet (4 mg total) by mouth every 6 (six) hours as needed for muscle spasms., Disp: 30 tablet, Rfl: 0   tamsulosin (FLOMAX) 0.4 MG CAPS capsule, Take 0.4 mg by mouth daily., Disp: , Rfl:   Observations/Objective: Patient is well-developed, well-nourished in no acute distress.  Resting comfortably  at home.  Head is normocephalic, atraumatic.  No labored breathing.  Speech is clear and coherent with logical content.  Patient is alert and oriented at baseline.   Assessment and Plan: 1. Lumbar degenerative disc disease No red flags Will trial burst of oral prednisone 50 mg x 5 days May continue zanaflex 4 mg prn Recommend follow-up with sports medicine if any worsening symptoms  Follow Up Instructions: I discussed the assessment and treatment plan with the patient. The patient was provided an opportunity to ask questions and all were answered. The patient agreed with the plan and demonstrated an understanding of the instructions.  A copy of instructions were sent to the patient via MyChart unless otherwise noted below.   The patient was advised to call back or seek an in-person evaluation if the symptoms worsen or if the condition fails to improve as anticipated.  Inda Coke, [place]

## 2021-05-21 NOTE — Therapy (Signed)
[facility] @ [place], [place], [address] Phone: [phone]   Fax:  [phone]  Physical Therapy Treatment  Patient Details  Name: David Armstrong MRN: [id number] Date of Birth: [birth year] Referring Provider (PT): [name], DO   Encounter Date: 05/21/2021   PT End of Session - 05/21/21 1108     Visit Number 4    Date for PT Re-Evaluation 06/28/21    Authorization Type Medicare/Aetna    PT Start Time 1101    PT Stop Time 1140    PT Time Calculation (min) 39 min    Activity Tolerance Patient tolerated treatment well             Past Medical History:  Diagnosis Date   Anxiety    Arthritis    "lower back; hips;' right knee"    Bipolar disorder ([place])    Chronic constipation    Depression    Diverticulosis of colon    Edema of both lower extremities    ANKLES   Fatty liver    GERD (gastroesophageal reflux disease)    Hiatal hernia    History of anal fissures    History of deep vein thrombosis (DVT) of lower extremity 02/11/2016   right lower extremity distal and proximal extensive acute   History of Helicobacter pylori infection 2012; 2009   History of kidney stones    History of pneumonia 01/2015   CAP   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    Hyperlipidemia    IBS (irritable bowel syndrome)    Left ureteral stone    Nocturia    Peripheral neuropathy    per pt due to back DDD   Personal history of colonic polyps    HYPERPLASTIC POLYP   Pulmonary nodule, right    incidental finding CT chest angio 02-11-2016 stable   TMJ (temporomandibular joint syndrome)    Varicosities of leg     Past Surgical History:  Procedure Laterality Date   BELPHAROPTOSIS REPAIR Bilateral 01/2015   CATARACT EXTRACTION W/ INTRAOCULAR LENS  IMPLANT, BILATERAL  04/2016   COLONOSCOPY WITH ESOPHAGOGASTRODUODENOSCOPY (EGD)  last one 03-02-2013    CYSTOSCOPY/RETROGRADE/URETEROSCOPY/STONE EXTRACTION WITH BASKET  2010   CYSTOSCOPY/URETEROSCOPY/HOLMIUM LASER/STENT PLACEMENT Left 03/16/2017   Procedure: CYSTOSCOPY/RETROGRADE/URETEROSCOPY/HOLMIUM LASER/STENT PLACEMENT, STONE BASKET EXTRACTION AND STENT PLACEMENT;  Surgeon: [name], MD;  Location: [facility];  Service: Urology;  Laterality: Left;   EXCISIONAL HEMORRHOIDECTOMY  2015 approx.   EXTRACORPOREAL SHOCK WAVE LITHOTRIPSY  1987; 1992   FOOT NEUROMA SURGERY Bilateral left 09/2015; right 04/ 2017   morton' neuroma   FOOT NEUROMA SURGERY Right 2004   morton's neuroma and removal heel spur   HAMMER TOE SURGERY Left 2013   5th toe   INCISION AND DRAINAGE FOOT Left 1992   "ball of foot; stepped on piece of hard wire & it went thru my shoe"   KNEE ARTHROSCOPY Bilateral left x2 1997;  right 2004 & 2005   [facility]   PATELLA-FEMORAL ARTHROPLASTY Right 07-06-2003   dr Eddie Dibbles [facility]   PLANTAR FASCIA RELEASE Bilateral 1987 & 1993   RHINOPLASTY  2000   SHOULDER ARTHROSCOPY Left 2012;  2013   SHOULDER ARTHROSCOPY WITH DISTAL CLAVICLE RESECTION Right 07-31-2005   dr Percell Miller  [facility]   w/ Debridement labral and adhesions, acromioplasty, bursectomy, and CA ligament release   TRANSTHORACIC ECHOCARDIOGRAM  02/12/2016   ef 85-88%, grade 1 diastolic dysfunction/  trivial TR    There were no vitals filed for this visit.   Subjective Assessment - 05/21/21 1104     Subjective My back went out again trying to put on compression socks.  I took a muscle relaxer this morning.    Pertinent History History of DVT (rt leg) and PE on Xarelto, GERD,    Currently in Pain? Yes    Pain Score 6     Pain Location Thoracic    Pain Orientation Mid;Right;Left    Pain Type Acute pain                               [facility] Adult PT Treatment/Exercise - 05/21/21 0001       Neck Exercises: Supine   Other Supine Exercise inhale with bil UE elevation, exhale on UE  lowering 8x      Lumbar Exercises: Stretches   Other Lumbar Stretch Exercise seated ball roll outs emphasizing thoracic extension 8x      Lumbar Exercises: Seated   Other Seated Lumbar Exercises thoracic extension with foam roll 10x    Other Seated Lumbar Exercises seated against the wall open book 8x right/left      Lumbar Exercises: Supine   Other Supine Lumbar Exercises elbow press 8x    Other Supine Lumbar Exercises shoulder press 8x      Moist Heat Therapy   Number Minutes Moist Heat 10 Minutes    Moist Heat Location Lumbar Spine      Electrical Stimulation   Electrical Stimulation Location mid thoracic    Electrical Stimulation Action IFC    Electrical Stimulation Parameters 22 ma 10 min sidelying    Electrical Stimulation Goals Pain                       PT Short Term Goals - 04/30/21 1716       PT SHORT TERM GOAL #1   Title pt to be I with HEP    Time 4    Period Weeks    Status New    Target Date 05/28/21      PT SHORT TERM GOAL #2   Title pt to report no more than 4/10 pain in thoracic and lumbar spine with improved ability to stand for at least 30 mins for household tasks.    Time 4    Period Weeks    Status New    Target Date 05/28/21               PT Long Term Goals - 04/30/21 1718       PT LONG TERM GOAL #1   Title Pt to be I with advanced HEP    Time 2    Period Months    Status New    Target Date 06/28/21      PT LONG TERM GOAL #2   Title pt to report no more than 2/10 pain with back during functional tasks for ability to stand for at least 45 mins    Time 2    Period Months    Status New    Target Date 06/28/21      PT LONG TERM GOAL #3   Title pt to report at least 24 FOTO score for improved pt's precieved functional improvement.    Time 2    Period Months    Status New    Target Date 06/28/21  PT LONG TERM GOAL #4   Title pt to demonstrate ability lift 15# from floor with good mechanics an no more than 2/10  pain for improved hip strength and stability.    Time 2    Period Months    Status New    Target Date 06/28/21                   Plan - 05/21/21 1140     Clinical Impression Statement The patient reports a back pain exacerbation since Friday and therefore treatment session modifed accordingly.  Pain is in mid thoracic region more then lumbar.  He has pain behaviors with sit to stand, initial few minutes of supine lying and with rolling.  He is able to perform low level mobility ex's followed by ES and heat for pain modulation.  Therapist monitoring response throughout treatment session.    Personal Factors and Comorbidities Age;Time since onset of injury/illness/exacerbation;Comorbidity 1    Comorbidities History of DVT and PE on Xarelto, GERD, Rt knee replacement    Examination-Participation Restrictions Community Activity;Yard Work;Cleaning    Rehab Potential Good    PT Frequency 2x / week    PT Duration 8 weeks    PT Treatment/Interventions ADLs/Self Care Home Management;Aquatic Therapy;Functional mobility training;Therapeutic activities;Therapeutic exercise;Moist Heat;Balance training;Neuromuscular re-education;Manual techniques;Patient/family education;Taping;Passive range of motion;Dry needling;Energy conservation;Joint Manipulations;Spinal Manipulations;Vasopneumatic Device;Cryotherapy;Electrical Stimulation;Gait training;Stair training    PT Next Visit Plan ES/heat as needed;  return to previous ex intensity as needed;  stretching of spine and core/hip strengthening; partial dead lifts; sit to stands; Pallof; Nu-Step    PT Home Exercise Plan [facility]             Patient will benefit from skilled therapeutic intervention in order to improve the following deficits and impairments:  Decreased coordination, Decreased endurance, Decreased strength, Decreased mobility, Postural dysfunction, Improper body mechanics, Impaired flexibility, Pain, Hypomobility, Increased muscle  spasms, Increased fascial restricitons  Visit Diagnosis: Muscle weakness (generalized)  Abnormal posture  Acute bilateral low back pain without sciatica     Problem List Patient Active Problem List   Diagnosis Date Noted   Sleep-disordered breathing 11/30/2020   Skin lesion 11/30/2020   Headache 10/10/2020   Cataracts, bilateral 07/18/2020   Osteoarthritis 06/07/2020   DVT (deep venous thrombosis) ([place]) 02/16/2020   Leg edema 01/13/2020   Internal hemorrhoids 02/24/2018   Dyslipidemia 02/05/2018   Seborrheic dermatitis 09/14/2017   Inguinal hernia 06/05/2017   Allergic rhinitis 04/23/2017   Lumbar degenerative disc disease 07/31/2016   History of pulmonary embolism and DVT (Oct 2017) 02/11/2016   ETD (Eustachian tube dysfunction), bilateral 02/04/2016   Chronic pansinusitis 12/19/2015   Cough, persistent 12/19/2015   Nephrolithiasis 04/19/2014   Peripheral neuropathy ([place]) 12/15/2013   IBS (irritable bowel syndrome) 03/09/2012   GERD 10/26/2007   Anxiety state 11/23/2006   Depression, major, single episode, mild ([place]) 11/23/2006   Ruben Im, PT 05/21/21 11:57 AM Phone: [phone] Fax: [phone]  Alvera Singh, PT 05/21/2021, 11:56 AM  [facility] @ [place], [place], [address] Phone: [phone]   Fax:  [phone]  Name: David Armstrong MRN: [id number] Date of Birth: 13-Apr-1951

## 2021-05-23 ENCOUNTER — Telehealth (INDEPENDENT_AMBULATORY_CARE_PROVIDER_SITE_OTHER): Payer: Medicare Other | Admitting: Family Medicine

## 2021-05-23 ENCOUNTER — Ambulatory Visit: Payer: Medicare Other | Admitting: Physical Therapy

## 2021-05-23 ENCOUNTER — Other Ambulatory Visit: Payer: Self-pay

## 2021-05-23 DIAGNOSIS — U071 COVID-19: Secondary | ICD-10-CM

## 2021-05-23 DIAGNOSIS — M6281 Muscle weakness (generalized): Secondary | ICD-10-CM

## 2021-05-23 DIAGNOSIS — M545 Low back pain, unspecified: Secondary | ICD-10-CM

## 2021-05-23 DIAGNOSIS — R293 Abnormal posture: Secondary | ICD-10-CM

## 2021-05-23 NOTE — Therapy (Addendum)
[facility] @ [facility] [address] [place], [place], [address] Phone: [phone]   Fax:  [phone]  Physical Therapy Treatment  Patient Details  Name: David Armstrong MRN: [id number] Date of Birth: 18-Apr-1950 Referring Provider (PT): Richardean Sale, DO   Encounter Date: 05/23/2021   PT End of Session - 05/23/21 1058     Visit Number 5    Date for PT Re-Evaluation 06/28/21    Authorization Type Medicare/Aetna    PT Start Time 1100    PT Stop Time 1140   DN heat   PT Time Calculation (min) 40 min    Activity Tolerance Patient tolerated treatment well             Past Medical History:  Diagnosis Date   Anxiety    Arthritis    "lower back; hips;' right knee"    Bipolar disorder ([facility])    Chronic constipation    Depression    Diverticulosis of colon    Edema of both lower extremities    ANKLES   Fatty liver    GERD (gastroesophageal reflux disease)    Hiatal hernia    History of anal fissures    History of deep vein thrombosis (DVT) of lower extremity 02/11/2016   right lower extremity distal and proximal extensive acute   History of Helicobacter pylori infection 2012; 2009   History of kidney stones    History of pneumonia 01/2015   CAP   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    Hyperlipidemia    IBS (irritable bowel syndrome)    Left ureteral stone    Nocturia    Peripheral neuropathy    per pt due to back DDD   Personal history of colonic polyps    HYPERPLASTIC POLYP   Pulmonary nodule, right    incidental finding CT chest angio 02-11-2016 stable   TMJ (temporomandibular joint syndrome)    Varicosities of leg     Past Surgical History:  Procedure Laterality Date   BELPHAROPTOSIS REPAIR Bilateral 01/2015   CATARACT EXTRACTION W/ INTRAOCULAR LENS  IMPLANT, BILATERAL  04/2016   COLONOSCOPY WITH ESOPHAGOGASTRODUODENOSCOPY (EGD)  last one 03-02-2013    CYSTOSCOPY/RETROGRADE/URETEROSCOPY/STONE EXTRACTION WITH BASKET  2010   CYSTOSCOPY/URETEROSCOPY/HOLMIUM LASER/STENT PLACEMENT Left 03/16/2017   Procedure: CYSTOSCOPY/RETROGRADE/URETEROSCOPY/HOLMIUM LASER/STENT PLACEMENT, STONE BASKET EXTRACTION AND STENT PLACEMENT;  Surgeon: Ihor Gully, MD;  Location: [facility];  Service: Urology;  Laterality: Left;   EXCISIONAL HEMORRHOIDECTOMY  2015 approx.   EXTRACORPOREAL SHOCK WAVE LITHOTRIPSY  1987; 1992   FOOT NEUROMA SURGERY Bilateral left 09/2015; right 04/ 2017   morton' neuroma   FOOT NEUROMA SURGERY Right 2004   morton's neuroma and removal heel spur   HAMMER TOE SURGERY Left 2013   5th toe   INCISION AND DRAINAGE FOOT Left 1992   "ball of foot; stepped on piece of hard wire & it went thru my shoe"   KNEE ARTHROSCOPY Bilateral left x2 1997;  right 2004 & 2005   NASAL FRACTURE SURGERY  1997   PATELLA-FEMORAL ARTHROPLASTY Right 07-06-2003   dr Renae Fickle [facility]   PLANTAR FASCIA RELEASE Bilateral 1987 & 1993   RHINOPLASTY  2000   SHOULDER ARTHROSCOPY Left 2012;  2013   SHOULDER ARTHROSCOPY WITH DISTAL CLAVICLE RESECTION Right 07-31-2005   dr Eulah Pont  [facility]   w/ Debridement labral and adhesions, acromioplasty, bursectomy, and CA ligament release   TRANSTHORACIC ECHOCARDIOGRAM  02/12/2016   ef 65-70%, grade 1  diastolic dysfunction/  trivial TR    There were no vitals filed for this visit.   Subjective Assessment - 05/23/21 1058     Subjective I'm still not so good.  Midback pain.  I think the doctor needs to do more tests.  It's hard to stand up straight.  I'm not sure if the TENS helped or not.  Hard to get out of bed, first standing is agony.  It's different than before.  My low back has not bothered me much.    Pertinent History History of DVT (rt leg) and PE on Xarelto, GERD,    Limitations Sitting;Walking;Standing;Lifting;House hold activities    How long can you sit comfortably? depends on the day but usually for a few minutes     Currently in Pain? Yes    Pain Score 8     Pain Location Thoracic    Pain Orientation Right    Pain Type Acute pain                               [facility] Adult PT Treatment/Exercise - 05/23/21 0001       Lumbar Exercises: Seated   Other Seated Lumbar Exercises encouraged frequent change of position      Lumbar Exercises: Prone   Other Prone Lumbar Exercises attempted propping on elbows but too painful      Moist Heat Therapy   Number Minutes Moist Heat 5 Minutes    Moist Heat Location Lumbar Spine   thoracic     Manual Therapy   Joint Mobilization attempted seated thoracic extension mobs but too painful    Soft tissue mobilization prone right paraspinals soft tissue mobilization with Biofreeze              Trigger Point Dry Needling - 05/23/21 0001     Consent Given? Yes    Education Handout Provided Previously provided    Dry Needling Comments right thoracic paraspinals                     PT Short Term Goals - 04/30/21 1716       PT SHORT TERM GOAL #1   Title pt to be I with HEP    Time 4    Period Weeks    Status New    Target Date 05/28/21      PT SHORT TERM GOAL #2   Title pt to report no more than 4/10 pain in thoracic and lumbar spine with improved ability to stand for at least 30 mins for household tasks.    Time 4    Period Weeks    Status New    Target Date 05/28/21               PT Long Term Goals - 04/30/21 1718       PT LONG TERM GOAL #1   Title Pt to be I with advanced HEP    Time 2    Period Months    Status New    Target Date 06/28/21      PT LONG TERM GOAL #2   Title pt to report no more than 2/10 pain with back during functional tasks for ability to stand for at least 45 mins    Time 2    Period Months    Status New    Target Date 06/28/21      PT LONG TERM GOAL #  3   Title pt to report at least 54 FOTO score for improved pt's precieved functional improvement.    Time 2    Period Months     Status New    Target Date 06/28/21      PT LONG TERM GOAL #4   Title pt to demonstrate ability lift 15# from floor with good mechanics an no more than 2/10 pain for improved hip strength and stability.    Time 2    Period Months    Status New    Target Date 06/28/21                   Plan - 05/23/21 1746     Clinical Impression Statement The patient continues to be in an exacerbation period this week compared to last week when he was able to perform a very active exercise session.    Pain behaviors with sit to stand and with initially lying prone although with time the pain does seem to modulate.  He is quite tender with palpation of right thoracic paraspinals initially but following DN he is able to tolerate light soft tissue mobilization.  He is unable to tolerate attempted thoracic joint mobilization oscillations in prone or seated.  Discussed remaining mobile and encouraged postural awareness and slouching avoidance.  Therapist modifying treatment plan based on high pain rating today.    Personal Factors and Comorbidities Age;Time since onset of injury/illness/exacerbation;Comorbidity 1    Comorbidities History of DVT and PE on Xarelto, GERD, Rt knee replacement    Examination-Activity Limitations Bed Mobility;Locomotion Level;Transfers;Stand;Stairs;Squat;Sit    Rehab Potential Good    PT Frequency 2x / week    PT Duration 8 weeks    PT Treatment/Interventions ADLs/Self Care Home Management;Aquatic Therapy;Functional mobility training;Therapeutic activities;Therapeutic exercise;Moist Heat;Balance training;Neuromuscular re-education;Manual techniques;Patient/family education;Taping;Passive range of motion;Dry needling;Energy conservation;Joint Manipulations;Spinal Manipulations;Vasopneumatic Device;Cryotherapy;Electrical Stimulation;Gait training;Stair training    PT Next Visit Plan assess response to DN right thoracic paraspinals; thoracic mobility, soft tissue mobility; return  to ex as tolerated    PT Home Exercise Plan [facility]             Patient will benefit from skilled therapeutic intervention in order to improve the following deficits and impairments:  Decreased coordination, Decreased endurance, Decreased strength, Decreased mobility, Postural dysfunction, Improper body mechanics, Impaired flexibility, Pain, Hypomobility, Increased muscle spasms, Increased fascial restricitons  Visit Diagnosis: Muscle weakness (generalized)  Acute bilateral low back pain without sciatica  Abnormal posture     Problem List Patient Active Problem List   Diagnosis Date Noted   Sleep-disordered breathing 11/30/2020   Skin lesion 11/30/2020   Headache 10/10/2020   Cataracts, bilateral 07/18/2020   Osteoarthritis 06/07/2020   DVT (deep venous thrombosis) ([facility]) 02/16/2020   Leg edema 01/13/2020   Internal hemorrhoids 02/24/2018   Dyslipidemia 02/05/2018   Seborrheic dermatitis 09/14/2017   Inguinal hernia 06/05/2017   Allergic rhinitis 04/23/2017   Lumbar degenerative disc disease 07/31/2016   History of pulmonary embolism and DVT (Oct 2017) 02/11/2016   ETD (Eustachian tube dysfunction), bilateral 02/04/2016   Chronic pansinusitis 12/19/2015   Cough, persistent 12/19/2015   Nephrolithiasis 04/19/2014   Peripheral neuropathy ([facility]) 12/15/2013   IBS (irritable bowel syndrome) 03/09/2012   GERD 10/26/2007   Anxiety state 11/23/2006   Depression, major, single episode, mild ([facility]) 11/23/2006   Lavinia Sharps, PT 05/23/21 5:54 PM Phone: [phone] Fax: [phone]  Vivien Presto, PT 05/23/2021, 5:53 PM  [facility] @  [facility] [address] [place], [place], [address] Phone: [phone]   Fax:  [phone]  Name: David Armstrong MRN: [id number] Date of Birth: 1950/09/16  PHYSICAL THERAPY DISCHARGE SUMMARY  Visits from Start of Care: 5  Current functional level related to goals / functional  outcomes: See above.  Pt did not return to clinic.   Remaining deficits: See above   Education / Equipment: Initial HEP and education   Patient agrees to discharge. Patient goals were not met. Patient is being discharged due to not returning since the last visit.  Johanna Beuhring, PT 07/18/21 3:05 PM

## 2021-05-23 NOTE — Patient Instructions (Signed)
I am sorry you are still dealing with this.   Most folks are contagious and test positive for around 10 days, but some folks can have symptoms and a positive test and remain contagious for longer.  This is from the [facility] website:  "If your antigen test results are positive, you may still be infectious. You should continue wearing a mask and wait at least 48 hours before taking another test. Continue taking antigen tests at least 48 hours apart until you have two sequential negative results. This may mean you need to continue wearing a mask and testing beyond day 10."  Nasal saline twice daily.   Wear a high quality mask at all times if around others until all better.   I hope you are feeling better soon!  Seek in person care promptly if your symptoms worsen or you have further concerns.   It was nice to meet you today. I help David Armstrong out with telemedicine visits on Tuesdays and Thursdays and am happy to help if you need a virtual follow up visit on those days. Otherwise, if you have any concerns or questions following this visit please schedule a follow up visit with your Primary Care office or seek care at a local urgent care clinic to avoid delays in care

## 2021-05-23 NOTE — Progress Notes (Signed)
Virtual Visit via Video Note  I connected with David Armstrong  on 05/23/21 at  3:40 PM EST by a video enabled telemedicine application and verified that I am speaking with the correct person using two identifiers.  Location patient: [place] Location provider:work or home office Persons participating in the virtual visit: patient, provider  I discussed the limitations and requested verbal permission for telemedicine visit. The patient expressed understanding and agreed to proceed.   HPI:  Acute telemedicine visit for Covid19: -Onset: about 1.5-2 weeks ago, was diagnosed with covid and was treated with Legevrio  -Symptoms include: had cough, congestion, some sneezing - now still having some sneezing a little pnd and is still testing positive for covid today -Denies:CP, SOB, NVD -drinking fluids, eating ok -Pertinent past medical history: see below -Pertinent medication allergies: Allergies  Allergen Reactions   Effexor Xr [Venlafaxine Hcl Er] Other (See Comments)    Insomnia   Acyclovir And Related    Zocor [Simvastatin] Other (See Comments)    Muscle ache/ pain   Dexilant [Dexlansoprazole] Other (See Comments)    Muscle aches   Ivp Dye [Iodinated Contrast Media] Rash   Soma [Carisoprodol] Other (See Comments)    Sores on arm   Sulfa Antibiotics Rash  -COVID-19 vaccine status:  Immunization History  Administered Date(s) Administered   Fluad Quad(high Dose 65+) 12/25/2020   Influenza Split 12/22/2012, 01/13/2016   Influenza, High Dose Seasonal PF 01/19/2018, 12/02/2018   Influenza,inj,Quad PF,6+ Mos 01/07/2014   Influenza-Unspecified 02/12/2015, 01/13/2016, 12/30/2019   Moderna Covid-19 Vaccine Bivalent Booster 87yrs & up 01/09/2021   Moderna Sars-Covid-2 Vaccination 06/15/2019, 07/13/2019, 02/14/2020, 07/31/2020   Pneumococcal Conjugate-13 08/05/2016   Pneumococcal Polysaccharide-23 01/19/2018   Tdap 11/29/2012, 06/04/2016   Zoster Recombinat (Shingrix) 07/13/2016, 09/12/2016   Zoster,  Live 11/29/2012     ROS: See pertinent positives and negatives per HPI.  Past Medical History:  Diagnosis Date   Anxiety    Arthritis    "lower back; hips;' right knee"    Bipolar disorder ([place])    Chronic constipation    Depression    Diverticulosis of colon    Edema of both lower extremities    ANKLES   Fatty liver    GERD (gastroesophageal reflux disease)    Hiatal hernia    History of anal fissures    History of deep vein thrombosis (DVT) of lower extremity 02/11/2016   right lower extremity distal and proximal extensive acute   History of Helicobacter pylori infection 2012; 2009   History of kidney stones    History of pneumonia 01/2015   CAP   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    Hyperlipidemia    IBS (irritable bowel syndrome)    Left ureteral stone    Nocturia    Peripheral neuropathy    per pt due to back DDD   Personal history of colonic polyps    HYPERPLASTIC POLYP   Pulmonary nodule, right    incidental finding CT chest angio 02-11-2016 stable   TMJ (temporomandibular joint syndrome)    Varicosities of leg     Past Surgical History:  Procedure Laterality Date   BELPHAROPTOSIS REPAIR Bilateral 01/2015   CATARACT EXTRACTION W/ INTRAOCULAR LENS  IMPLANT, BILATERAL  04/2016   COLONOSCOPY WITH ESOPHAGOGASTRODUODENOSCOPY (EGD)  last one 03-02-2013   CYSTOSCOPY/RETROGRADE/URETEROSCOPY/STONE EXTRACTION WITH BASKET  2010   CYSTOSCOPY/URETEROSCOPY/HOLMIUM LASER/STENT PLACEMENT Left 03/16/2017   Procedure: CYSTOSCOPY/RETROGRADE/URETEROSCOPY/HOLMIUM LASER/STENT PLACEMENT, STONE BASKET EXTRACTION AND STENT PLACEMENT;  Surgeon: [name],  MD;  Location: [place];  Service: Urology;  Laterality: Left;   EXCISIONAL HEMORRHOIDECTOMY  2015 approx.   EXTRACORPOREAL SHOCK WAVE LITHOTRIPSY  1987; 1992   FOOT NEUROMA SURGERY Bilateral left 09/2015; right 04/ 2017   morton' neuroma   FOOT NEUROMA SURGERY Right 2004    morton's neuroma and removal heel spur   HAMMER TOE SURGERY Left 2013   5th toe   INCISION AND DRAINAGE FOOT Left 1992   "ball of foot; stepped on piece of hard wire & it went thru my shoe"   KNEE ARTHROSCOPY Bilateral left x2 1997;  right 2004 & 2005   [place]   PATELLA-FEMORAL ARTHROPLASTY Right 07-06-2003   dr Eddie Dibbles [facility]   PLANTAR FASCIA RELEASE Bilateral 1987 & 1993   RHINOPLASTY  2000   SHOULDER ARTHROSCOPY Left 2012;  2013   SHOULDER ARTHROSCOPY WITH DISTAL CLAVICLE RESECTION Right 07-31-2005   dr Percell Miller  [facility]   w/ Debridement labral and adhesions, acromioplasty, bursectomy, and CA ligament release   TRANSTHORACIC ECHOCARDIOGRAM  02/12/2016   ef 16-10%, grade 1 diastolic dysfunction/  trivial TR     Current Outpatient Medications:    betamethasone dipropionate 0.05 % cream, Apply topically 2 (two) times daily., Disp: 45 g, Rfl: 2   desonide (DESOWEN) 0.05 % lotion, APPLY TOPICALLY TO THE AFFECTED AREA EVERY DAY AS NEEDED, Disp: 118 mL, Rfl: 2   Elastic Bandages & Supports (MEDICAL COMPRESSION STOCKINGS) MISC, Use daily., Disp: 2 each, Rfl: 0   fluticasone (FLONASE) 50 MCG/ACT nasal spray, Place 2 sprays into both nostrils daily., Disp: 16 g, Rfl: 6   hyoscyamine (LEVSIN SL) 0.125 MG SL tablet, Take 2 tablets (0.25 mg total) by mouth every 6 (six) hours as needed. DISSOLVE 1 TABLET(0.125 MG) UNDER THE TONGUE EVERY 6 HOURS AS NEEDED, Disp: 120 tablet, Rfl: 3   LORazepam (ATIVAN) 1 MG tablet, Take 0.5-1 tablets (0.5-1 mg total) by mouth every 8 (eight) hours as needed for anxiety., Disp: 90 tablet, Rfl: 0   meloxicam (MOBIC) 15 MG tablet, Take 1 tablet (15 mg total) by mouth daily., Disp: 14 tablet, Rfl: 0   omeprazole (PRILOSEC) 20 MG capsule, TAKE 1 CAPSULE BY MOUTH EVERY DAY AS NEEDED, Disp: , Rfl:    predniSONE (DELTASONE) 50 MG tablet, Take 1 tablet daily x 5 days, Disp: 5 tablet, Rfl: 0   rivaroxaban (XARELTO) 20 MG TABS tablet, Take 1 tablet (20 mg total) by  mouth daily with supper. Start 20 mg daily tablets after completion of 3 weeks of 15 mg BID dosing., Disp: 90 tablet, Rfl: 1   tamsulosin (FLOMAX) 0.4 MG CAPS capsule, Take 0.4 mg by mouth daily., Disp: , Rfl:    tiZANidine (ZANAFLEX) 4 MG tablet, Take 1 tablet (4 mg total) by mouth every 6 (six) hours as needed for muscle spasms., Disp: 30 tablet, Rfl: 0  EXAM:  VITALS per patient if applicable:  GENERAL: alert, oriented, appears well and in no acute distress  HEENT: atraumatic, conjunttiva clear, no obvious abnormalities on inspection of external nose and ears  NECK: normal movements of the head and neck  LUNGS: on inspection no signs of respiratory distress, breathing rate appears normal, no obvious gross SOB, gasping or wheezing  CV: no obvious cyanosis  MS: moves all visible extremities without noticeable abnormality  PSYCH/NEURO: pleasant and cooperative, no obvious depression or anxiety, speech and thought processing grossly intact  ASSESSMENT AND PLAN:  Discussed the following assessment and plan:  COVID-19  -we discussed possible serious and likely etiologies, options for evaluation and workup, limitations of telemedicine visit vs in person visit, treatment, treatment risks and precautions. Pt is agreeable to treatment via telemedicine at this moment. Suspect ongoing covid infection. Have seen some patients remain symptomatic and positive for up 16 -20 days even in mild cases. We had a long discussion. He is concerned he may still be contagious and he likely could be. Opted for nasal saline rinses, continued mask wearing if around others until better and testing negative of rapid antigen test and offered advised per the [facility] website. Advised to seek prompt virtual visit or in person care if worsening, new symptoms arise, or if is not improving with treatment as expected per our conversation of expected course.    I discussed the assessment and treatment plan with the patient.  The patient was provided an opportunity to ask questions and all were answered. The patient agreed with the plan and demonstrated an understanding of the instructions.     Lucretia Kern, DO

## 2021-05-28 ENCOUNTER — Ambulatory Visit: Payer: Medicare Other | Admitting: Physical Therapy

## 2021-05-28 ENCOUNTER — Encounter: Payer: Self-pay | Admitting: Family Medicine

## 2021-05-28 ENCOUNTER — Other Ambulatory Visit: Payer: Self-pay

## 2021-05-28 ENCOUNTER — Ambulatory Visit: Payer: Medicare Other | Admitting: Sports Medicine

## 2021-05-28 ENCOUNTER — Telehealth (INDEPENDENT_AMBULATORY_CARE_PROVIDER_SITE_OTHER): Payer: Medicare Other | Admitting: Family Medicine

## 2021-05-28 VITALS — Ht 72.0 in | Wt 164.0 lb

## 2021-05-28 DIAGNOSIS — Z86711 Personal history of pulmonary embolism: Secondary | ICD-10-CM

## 2021-05-28 DIAGNOSIS — M5136 Other intervertebral disc degeneration, lumbar region: Secondary | ICD-10-CM | POA: Diagnosis not present

## 2021-05-28 DIAGNOSIS — I824Z1 Acute embolism and thrombosis of unspecified deep veins of right distal lower extremity: Secondary | ICD-10-CM

## 2021-05-28 DIAGNOSIS — U071 COVID-19: Secondary | ICD-10-CM | POA: Diagnosis not present

## 2021-05-28 MED ORDER — AZITHROMYCIN 250 MG PO TABS: ORAL_TABLET | ORAL | 0 refills | Status: DC

## 2021-05-28 MED ORDER — PREDNISONE 50 MG PO TABS: ORAL_TABLET | ORAL | 0 refills | Status: DC

## 2021-05-28 NOTE — Progress Notes (Signed)
° °  David Armstrong is a 71 y.o. male who presents today for a virtual office visit.  Assessment/Plan:  New/Acute Problems: Cough Still with persistent symptoms after covid though no red flags.  Concern for possible sinus infection.  We will start prednisone and azithromycin.  Can continue over-the-counter meds.  We discussed reasons to return to care.  Encouraged hydration.  Follow-up as needed.  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  Lumbar degenerative disc disease Could be contributing to his above back Armstrong.  We will give prednisone burst as above.  He will follow-up with sports medicine.  DVT (deep venous thrombosis) ([place]) Anticoagulated on xarleto. Needs to avoid prolonged oral NSAIDs.      Subjective:  HPI:  Patient here with persistent covid. He was seen about 2 weeks ago. We started him on molnupiravir. He did well with this but is still having persistent symptoms including back Armstrong and malaise.  Lots of sneezing. No fevers or chills. Occasional cough.        Objective/Observations  Physical Exam: Gen: NAD, resting comfortably Pulm: Normal work of breathing Neuro: Grossly normal, moves all extremities Psych: Normal affect and thought content  Virtual Visit via Video   I connected with David Armstrong on 05/28/21 at  3:40 PM EST by a video enabled telemedicine application and verified that I am speaking with the correct person using two identifiers. The limitations of evaluation and management by telemedicine and the availability of in person appointments were discussed. The patient expressed understanding and agreed to proceed.   Patient location: Home Provider location: [place] participating in the virtual visit: Myself and Patient     David Armstrong. David Pain, MD 05/28/2021 12:30 PM

## 2021-05-28 NOTE — Assessment & Plan Note (Signed)
Could be contributing to his above back pain.  We will give prednisone burst as above.  He will follow-up with sports medicine.

## 2021-05-28 NOTE — Assessment & Plan Note (Signed)
On lifelong anticoagulation with Xarelto 20 mg daily.

## 2021-05-28 NOTE — Assessment & Plan Note (Signed)
Anticoagulated on xarleto. Needs to avoid prolonged oral NSAIDs.

## 2021-05-30 ENCOUNTER — Other Ambulatory Visit: Payer: Self-pay | Admitting: Family Medicine

## 2021-05-31 ENCOUNTER — Ambulatory Visit: Payer: Medicare Other | Admitting: Sports Medicine

## 2021-06-03 DIAGNOSIS — M5137 Other intervertebral disc degeneration, lumbosacral region: Secondary | ICD-10-CM | POA: Diagnosis not present

## 2021-06-03 DIAGNOSIS — M9905 Segmental and somatic dysfunction of pelvic region: Secondary | ICD-10-CM | POA: Diagnosis not present

## 2021-06-03 DIAGNOSIS — M9904 Segmental and somatic dysfunction of sacral region: Secondary | ICD-10-CM | POA: Diagnosis not present

## 2021-06-03 DIAGNOSIS — M9903 Segmental and somatic dysfunction of lumbar region: Secondary | ICD-10-CM | POA: Diagnosis not present

## 2021-06-03 NOTE — Progress Notes (Signed)
Benito Mccreedy D.[place] Phone: [phone]   Assessment and Plan:     1. Acute bilateral low back pain without sciatica 2. Lumbar degenerative disc disease 3. Chronic bilateral thoracic back pain 4. Closed fracture of twelfth thoracic vertebra with delayed healing, unspecified fracture morphology, subsequent encounter -Chronic with exacerbation, subsequent sports medicine visit - Upon further discussion with patient, he has had low to mid back pain for years that is acutely worsened over the past several months.  Pain has significantly worsened since last office visit.  Pain is currently severe and limits patient's ability to do day-to-day activities, and severity of pain makes him feel like he will fall the ground at times - X-ray obtained in clinic.  My interpretation: Loss of vertebral height at T12 that is new compared with lumbar x-ray from 2019.  DDD thoracic spine with decreased joint space and vertebral spurring. - Based on x-ray findings with loss of vertebral height, patient's severe and worsening pain, risk of additional falls, failure to improve with conservative therapy, we will proceed with MRI of thoracic and lumbar spine - Start Tylenol 500 to 1000 mg 2-3 times a day for day-to-day pain relief  Pertinent previous records reviewed include outpatient rehab note from 05/23/2021   Follow Up: 3 days after MRI to review results.  Could consider intranasal calcitonin versus continue Tylenol versus corticosteroid injection versus referral to neurosurgery based on MRI results   Subjective:   I, David Armstrong, am serving as a Education administrator for Doctor [name]  Chief Complaint: back pain   HPI:  04/26/2021 Patient is a 71 year old male complaining of low back pain . Patient states that his low back is rough been going on since mid December was cleaning out his truck bad disc l5-s1 , had dvt in his right  leg had a cramp in his but and gets leg cramps , hard to get in and out of his car feels like hes in a bind and cant get unstuck, hasn't been taking meds. Heat and ice dont help  06/04/2021 Patient states he is not doing any well , pain is mostly on the right side of the not able to get up out of bed, when he does stand up he feels like his back is going to give out is afraid of falling again. Wants to talk about DVT hx    Relevant Historical Information: History of DVT and PE on Xarelto, GERD  Additional pertinent review of systems negative.   Current Outpatient Medications:    azithromycin (ZITHROMAX) 250 MG tablet, Take 2 tabs day 1, then 1 tab daily, Disp: 6 each, Rfl: 0   betamethasone dipropionate 0.05 % cream, Apply topically 2 (two) times daily., Disp: 45 g, Rfl: 2   desonide (DESOWEN) 0.05 % lotion, APPLY TOPICALLY TO THE AFFECTED AREA EVERY DAY AS NEEDED, Disp: 118 mL, Rfl: 2   Elastic Bandages & Supports (MEDICAL COMPRESSION STOCKINGS) MISC, Use daily., Disp: 2 each, Rfl: 0   fluticasone (FLONASE) 50 MCG/ACT nasal spray, Place 2 sprays into both nostrils daily., Disp: 16 g, Rfl: 6   hyoscyamine (LEVSIN SL) 0.125 MG SL tablet, Take 2 tablets (0.25 mg total) by mouth every 6 (six) hours as needed. DISSOLVE 1 TABLET(0.125 MG) UNDER THE TONGUE EVERY 6 HOURS AS NEEDED, Disp: 120 tablet, Rfl: 3   LORazepam (ATIVAN) 1 MG tablet, Take 0.5-1 tablets (0.5-1 mg total) by mouth every 8 (  eight) hours as needed for anxiety., Disp: 90 tablet, Rfl: 0   omeprazole (PRILOSEC) 20 MG capsule, TAKE 1 CAPSULE BY MOUTH EVERY DAY AS NEEDED, Disp: , Rfl:    predniSONE (DELTASONE) 50 MG tablet, Take 1 tablet daily for 5 days., Disp: 5 tablet, Rfl: 0   rivaroxaban (XARELTO) 20 MG TABS tablet, Take 1 tablet (20 mg total) by mouth daily with supper. Start 20 mg daily tablets after completion of 3 weeks of 15 mg BID dosing., Disp: 90 tablet, Rfl: 1   tamsulosin (FLOMAX) 0.4 MG CAPS capsule, Take 0.4 mg by mouth  daily., Disp: , Rfl:    tiZANidine (ZANAFLEX) 4 MG tablet, Take 1 tablet (4 mg total) by mouth every 6 (six) hours as needed for muscle spasms., Disp: 30 tablet, Rfl: 0   Objective:     Vitals:   06/04/21 0859  BP: 132/80  Pulse: 94  SpO2: 99%  Weight: 168 lb (76.2 kg)  Height: 6' (1.829 m)      Body mass index is 22.78 kg/m.    Physical Exam:    Gen: Appears well, nad, nontoxic and pleasant Psych: Alert and oriented, appropriate mood and affect Neuro: sensation intact, strength is 5/5 in upper and lower extremities, muscle tone wnl Skin: no susupicious lesions or rashes  Back - Normal skin, Spine with normal alignment and no deformity.   Mild TTP vertebral process T11-L2, otherwise no tenderness to vertebral process palpation.   Paraspinous muscles are significantly tender T11-L2, moderately T8-T10 and L3-L5 and without spasm Straight leg raise negative  Electronically signed by:  Benito Mccreedy D.[facility] 10:11 AM 06/04/21

## 2021-06-04 ENCOUNTER — Encounter: Payer: Self-pay | Admitting: Family Medicine

## 2021-06-04 ENCOUNTER — Other Ambulatory Visit: Payer: Self-pay

## 2021-06-04 ENCOUNTER — Ambulatory Visit (INDEPENDENT_AMBULATORY_CARE_PROVIDER_SITE_OTHER): Payer: Medicare Other | Admitting: Family Medicine

## 2021-06-04 ENCOUNTER — Ambulatory Visit (INDEPENDENT_AMBULATORY_CARE_PROVIDER_SITE_OTHER): Payer: Medicare Other

## 2021-06-04 ENCOUNTER — Ambulatory Visit (INDEPENDENT_AMBULATORY_CARE_PROVIDER_SITE_OTHER): Payer: Medicare Other | Admitting: Sports Medicine

## 2021-06-04 VITALS — BP 108/76 | HR 105 | Temp 98.1°F | Ht 72.0 in | Wt 169.6 lb

## 2021-06-04 VITALS — BP 132/80 | HR 94 | Ht 72.0 in | Wt 168.0 lb

## 2021-06-04 DIAGNOSIS — M545 Low back pain, unspecified: Secondary | ICD-10-CM | POA: Diagnosis not present

## 2021-06-04 DIAGNOSIS — G8929 Other chronic pain: Secondary | ICD-10-CM

## 2021-06-04 DIAGNOSIS — S22089G Unspecified fracture of T11-T12 vertebra, subsequent encounter for fracture with delayed healing: Secondary | ICD-10-CM | POA: Diagnosis not present

## 2021-06-04 DIAGNOSIS — M5136 Other intervertebral disc degeneration, lumbar region: Secondary | ICD-10-CM

## 2021-06-04 DIAGNOSIS — M546 Pain in thoracic spine: Secondary | ICD-10-CM

## 2021-06-04 DIAGNOSIS — M5137 Other intervertebral disc degeneration, lumbosacral region: Secondary | ICD-10-CM | POA: Diagnosis not present

## 2021-06-04 MED ORDER — TRAMADOL HCL 50 MG PO TABS
50.0000 mg | ORAL_TABLET | Freq: Three times a day (TID) | ORAL | 0 refills | Status: AC | PRN
Start: 2021-06-04 — End: 2021-06-09

## 2021-06-04 NOTE — Patient Instructions (Addendum)
It was very nice to see you today!  Please use the tramadol as needed.  Let us know if you need refills.   Take care, Dr [name]  PLEASE NOTE:  If you had any lab tests please let us know if you have not heard back within a few days. You may see your results on mychart before we have a chance to review them but we will give you a call once they are reviewed by [place]. If we ordered any referrals today, please let us know if you have not heard from their office within the next week.   Please try these tips to maintain a healthy lifestyle:  Eat at least 3 REAL meals and 1-2 snacks per day.  Aim for no more than 5 hours between eating.  If you eat breakfast, please do so within one hour of getting up.   Each meal should contain half fruits/vegetables, one quarter protein, and one quarter carbs (no bigger than a computer mouse)  Cut down on sweet beverages. This includes juice, soda, and sweet tea.   Drink at least 1 glass of water with each meal and aim for at least 8 glasses per day  Exercise at least 150 minutes every week.

## 2021-06-04 NOTE — Patient Instructions (Addendum)
Good to see you  Xray on the way out  Recommend Tylenol [phone] mg 2-3 times a day for pain relief  MRI referral  Call and schedule appointment 3 days after your MRI to discuss your results

## 2021-06-04 NOTE — Assessment & Plan Note (Signed)
Recently saw sports medicine for this.  There is concern for T12 compression fracture based on plain films from today.  Pain is not currently controlled.  Has an upcoming MRI.  We will start tramadol to use as needed twice daily.  He will continue taking Tylenol.  We will otherwise continue management per orthopedics.

## 2021-06-04 NOTE — Progress Notes (Signed)
° °  David Armstrong is a 71 y.o. male who presents today for an office visit.  Assessment/Plan:  Chronic Problems Addressed Today: Lumbar degenerative disc disease Recently saw sports medicine for this.  There is concern for T12 compression fracture based on plain films from today.  Pain is not currently controlled.  Has an upcoming MRI.  We will start tramadol to use as needed twice daily.  He will continue taking Tylenol.  We will otherwise continue management per orthopedics.     Subjective:  HPI:  Patient here to follow up on back pain. This has been going on for the past 2-3 months. He is still having issue with back pain. He notes he was having hard time getting out of his bed. He saw his sport medicine doctor today. Had X-ray which showed compression fracture. He was recommended to take Tynelol [phone] mg during his sport medicine visit with Dr. [name]. He has tried Tramadol in the past and this has helped. He has not tried any other treatment. He is requesting to start on Tramadol for pain. He is schedule for MRI on 05/09/2021.       Objective:  Physical Exam: BP 108/76 (BP Location: Left Arm)    Pulse (!) 105    Temp 98.1 F (36.7 C) (Temporal)    Ht 6' (1.829 m)    Wt 169 lb 9.6 oz (76.9 kg)    SpO2 97%    BMI 23.00 kg/m   Gen: No acute distress, resting comfortably CV: Regular rate and rhythm with no murmurs appreciated Pulm: Normal work of breathing, clear to auscultation bilaterally with no crackles, wheezes, or rhonchi MSK: Back without deformities.  Tenderness to palpation along lower thoracic and upper lumbar area. Neuro: Grossly normal, moves all extremities Psych: Normal affect and thought content      I,Savera Zaman,acting as a scribe for [name], MD.,have documented all relevant documentation on the behalf of [name], MD,as directed by  [name], MD while in the presence of [name], MD.   I, [name], MD, have reviewed all documentation  for this visit. The documentation on 06/04/21 for the exam, diagnosis, procedures, and orders are all accurate and complete.  [name]. [name], MD 06/04/2021 3:16 PM

## 2021-06-07 DIAGNOSIS — Z20822 Contact with and (suspected) exposure to covid-19: Secondary | ICD-10-CM | POA: Diagnosis not present

## 2021-06-09 ENCOUNTER — Other Ambulatory Visit: Payer: Self-pay

## 2021-06-09 ENCOUNTER — Ambulatory Visit (INDEPENDENT_AMBULATORY_CARE_PROVIDER_SITE_OTHER): Payer: Medicare Other

## 2021-06-09 DIAGNOSIS — M545 Low back pain, unspecified: Secondary | ICD-10-CM

## 2021-06-09 DIAGNOSIS — S22089G Unspecified fracture of T11-T12 vertebra, subsequent encounter for fracture with delayed healing: Secondary | ICD-10-CM | POA: Diagnosis not present

## 2021-06-09 DIAGNOSIS — M546 Pain in thoracic spine: Secondary | ICD-10-CM | POA: Diagnosis not present

## 2021-06-09 DIAGNOSIS — M5136 Other intervertebral disc degeneration, lumbar region: Secondary | ICD-10-CM

## 2021-06-09 DIAGNOSIS — M47814 Spondylosis without myelopathy or radiculopathy, thoracic region: Secondary | ICD-10-CM | POA: Diagnosis not present

## 2021-06-09 DIAGNOSIS — G8929 Other chronic pain: Secondary | ICD-10-CM

## 2021-06-09 DIAGNOSIS — M5127 Other intervertebral disc displacement, lumbosacral region: Secondary | ICD-10-CM | POA: Diagnosis not present

## 2021-06-11 NOTE — Progress Notes (Signed)
? ? Benito Mccreedy D.Merril Abbe ?[facility] ?[place] ?Phone: [phone] ?  ?Assessment and Plan:   ?  ?1. Closed fracture of twelfth thoracic vertebra with delayed healing, unspecified fracture morphology, subsequent encounter ?2. Chronic bilateral thoracic back pain ?3. Lumbar degenerative disc disease ?-Chronic with exacerbation, subsequent visit ?- No change in acutely worsening back pain with history of chronic back pain ?- Reviewed lumbar and thoracic MRI with patient in clinic.  Discussed new appearing T12 vertebral fracture with 15% height loss and chronic appearing L1-L2 vertebral fractures with nearly 50% height loss ?- Start calcitonin 200 units 1 spray nightly.  Alternate nares each night ?-Start calcium and vitamin D supplementation ?- Use Tylenol 500 mg 2-3 times daily for pain relief ?- Referral to neurosurgery to discuss surgical treatment options.  Potential surgery complicated by patient's history of DVT/PE and currently being on anticoagulation ? ?Pertinent previous records reviewed include thoracic and lumbar MRI 06/09/2021 ?  ?Follow Up: As needed if patient decides to not proceed with surgical intervention and wishes to proceed with continued conservative treatment ?  ?Subjective:   ?I, Pincus Badder, am serving as a Education administrator for Doctor [employer] ? ?Chief Complaint: low back pain MRI follow up  ? ?HPI:  ?13/2023 ?Patient is a 71 year old male complaining of low back pain . Patient states that his low back is rough been going on since mid December was cleaning out his truck bad disc l5-s1 , had dvt in his right leg had a cramp in his but and gets leg cramps , hard to get in and out of his car feels like hes in a bind and cant get unstuck, hasn't been taking meds. Heat and ice dont help ?  ?06/04/2021 ?Patient states he is not doing any well , pain is mostly on the right side of the not able to get up out of bed, when he does stand up he  feels like his back is going to give out is afraid of falling again. Wants to talk about DVT hx ?  ? 06/12/2021 ?Patient states that he's doing the same maybe a little worse ? ? ? ?Relevant Historical Information: History of DVT and PE on Xarelto, GERD ? ?Additional pertinent review of systems negative. ? ? ?Current Outpatient Medications:  ?  betamethasone dipropionate 0.05 % cream, Apply topically 2 (two) times daily., Disp: 45 g, Rfl: 2 ?  calcitonin, salmon, (MIACALCIN) 200 UNIT/ACT nasal spray, Place 1 spray into alternate nostrils daily., Disp: 3.7 mL, Rfl: 2 ?  desonide (DESOWEN) 0.05 % lotion, APPLY TOPICALLY TO THE AFFECTED AREA EVERY DAY AS NEEDED, Disp: 118 mL, Rfl: 2 ?  Elastic Bandages & Supports (MEDICAL COMPRESSION STOCKINGS) MISC, Use daily., Disp: 2 each, Rfl: 0 ?  fluticasone (FLONASE) 50 MCG/ACT nasal spray, Place 2 sprays into both nostrils daily., Disp: 16 g, Rfl: 6 ?  hyoscyamine (LEVSIN SL) 0.125 MG SL tablet, Take 2 tablets (0.25 mg total) by mouth every 6 (six) hours as needed. DISSOLVE 1 TABLET(0.125 MG) UNDER THE TONGUE EVERY 6 HOURS AS NEEDED, Disp: 120 tablet, Rfl: 3 ?  LORazepam (ATIVAN) 1 MG tablet, Take 0.5-1 tablets (0.5-1 mg total) by mouth every 8 (eight) hours as needed for anxiety., Disp: 90 tablet, Rfl: 0 ?  omeprazole (PRILOSEC) 20 MG capsule, TAKE 1 CAPSULE BY MOUTH EVERY DAY AS NEEDED, Disp: , Rfl:  ?  rivaroxaban (XARELTO) 20 MG TABS tablet, Take 1 tablet (20 mg  total) by mouth daily with supper. Start 20 mg daily tablets after completion of 3 weeks of 15 mg BID dosing., Disp: 90 tablet, Rfl: 1 ?  tamsulosin (FLOMAX) 0.4 MG CAPS capsule, Take 0.4 mg by mouth daily., Disp: , Rfl:  ?  tiZANidine (ZANAFLEX) 4 MG tablet, Take 1 tablet (4 mg total) by mouth every 6 (six) hours as needed for muscle spasms., Disp: 30 tablet, Rfl: 0  ? ?Objective:   ?  ?Vitals:  ? 06/12/21 1344  ?BP: 132/72  ?Pulse: 92  ?SpO2: 97%  ?Weight: 169 lb (76.7 kg)  ?Height: 6' (1.829 m)  ?  ?  ?Body mass  index is 22.92 kg/m?.  ?  ?Physical Exam:   ? ?Gen: Appears well, nad, nontoxic and pleasant ?Psych: Alert and oriented, appropriate mood and affect ?Neuro: sensation intact, strength is 5/5 in upper and lower extremities, muscle tone wnl ?Skin: no susupicious lesions or rashes ?  ?Back - Normal skin, Spine with normal alignment and no deformity.   ?Mild TTP vertebral process T11-L2, otherwise no tenderness to vertebral process palpation.   ?Paraspinous muscles are significantly tender T11-L2, moderately T8-T10 and L3-L5 and without spasm ?Straight leg raise negative ? ? ?Electronically signed by:  ?Benito Mccreedy D.Merril Abbe ?[facility] ?2:11 PM 06/12/21 ?

## 2021-06-12 ENCOUNTER — Other Ambulatory Visit: Payer: Self-pay

## 2021-06-12 ENCOUNTER — Ambulatory Visit (INDEPENDENT_AMBULATORY_CARE_PROVIDER_SITE_OTHER): Payer: Medicare Other | Admitting: Sports Medicine

## 2021-06-12 VITALS — BP 132/72 | HR 92 | Ht 72.0 in | Wt 169.0 lb

## 2021-06-12 DIAGNOSIS — M546 Pain in thoracic spine: Secondary | ICD-10-CM

## 2021-06-12 DIAGNOSIS — G8929 Other chronic pain: Secondary | ICD-10-CM | POA: Diagnosis not present

## 2021-06-12 DIAGNOSIS — M51369 Other intervertebral disc degeneration, lumbar region without mention of lumbar back pain or lower extremity pain: Secondary | ICD-10-CM

## 2021-06-12 DIAGNOSIS — S22089G Unspecified fracture of T11-T12 vertebra, subsequent encounter for fracture with delayed healing: Secondary | ICD-10-CM | POA: Diagnosis not present

## 2021-06-12 DIAGNOSIS — M5136 Other intervertebral disc degeneration, lumbar region: Secondary | ICD-10-CM

## 2021-06-12 MED ORDER — CALCITONIN (SALMON) 200 UNIT/ACT NA SOLN: 1.0000 | Freq: Every day | NASAL | 2 refills | Status: DC

## 2021-06-12 NOTE — Patient Instructions (Addendum)
Good to see you  ?Neurosurgery referral  ?Start calcitonin nasal spray 1 spray a night  ?Buy over the counter calcium and vitamin D supplement take daily  ?Tylenol 500 mg 2-3 times a day for pain relief  ?As needed follow up if you decide to not go with neurosurgery  ?

## 2021-06-13 DIAGNOSIS — R519 Headache, unspecified: Secondary | ICD-10-CM | POA: Diagnosis not present

## 2021-06-13 DIAGNOSIS — G8929 Other chronic pain: Secondary | ICD-10-CM | POA: Diagnosis not present

## 2021-06-13 DIAGNOSIS — M5412 Radiculopathy, cervical region: Secondary | ICD-10-CM | POA: Diagnosis not present

## 2021-06-13 DIAGNOSIS — Z79899 Other long term (current) drug therapy: Secondary | ICD-10-CM | POA: Diagnosis not present

## 2021-06-13 DIAGNOSIS — M545 Low back pain, unspecified: Secondary | ICD-10-CM | POA: Diagnosis not present

## 2021-06-17 ENCOUNTER — Ambulatory Visit (INDEPENDENT_AMBULATORY_CARE_PROVIDER_SITE_OTHER): Payer: Medicare Other | Admitting: Family Medicine

## 2021-06-17 ENCOUNTER — Other Ambulatory Visit: Payer: Self-pay

## 2021-06-17 ENCOUNTER — Telehealth: Payer: Self-pay | Admitting: Family Medicine

## 2021-06-17 ENCOUNTER — Encounter: Payer: Self-pay | Admitting: Family Medicine

## 2021-06-17 VITALS — BP 131/82 | HR 108 | Temp 97.5°F | Ht 72.0 in | Wt 167.2 lb

## 2021-06-17 DIAGNOSIS — I824Z1 Acute embolism and thrombosis of unspecified deep veins of right distal lower extremity: Secondary | ICD-10-CM

## 2021-06-17 DIAGNOSIS — R600 Localized edema: Secondary | ICD-10-CM | POA: Diagnosis not present

## 2021-06-17 DIAGNOSIS — I872 Venous insufficiency (chronic) (peripheral): Secondary | ICD-10-CM

## 2021-06-17 MED ORDER — FUROSEMIDE 20 MG PO TABS: 20.0000 mg | ORAL_TABLET | Freq: Every day | ORAL | 3 refills | Status: DC | PRN

## 2021-06-17 MED ORDER — TRAMADOL HCL 50 MG PO TABS: 50.0000 mg | ORAL_TABLET | Freq: Three times a day (TID) | ORAL | 0 refills | Status: AC | PRN

## 2021-06-17 NOTE — Assessment & Plan Note (Addendum)
No red flags. He has tried conservative management including salt avoidance, compression, and elevation. Advised him to follow up with vascular surgeon soon. We discussed starting lasix. Given his skin changes and lack of improvement with conservative management this would be reasonable. ? ?We will start lasix '20mg'$  daily. Discussed potential side effects. He will follow up in a couple of weeks and we can check labs at that time.  ?

## 2021-06-17 NOTE — Progress Notes (Signed)
? ?  David Armstrong is a 71 y.o. male who presents today for an office visit. ? ?Assessment/Plan:  ?New/Acute Problems: ?T12 compression fracture ?On calcitonin per orthopedics. Pain is currently tolerable on this and tramadol as needed. Will refill his tramadol today.  ? ?Chronic Problems Addressed Today: ?DVT (deep venous thrombosis) ([place]) ?Anticoagulated on xarelto - doubt recurrence.  ? ?Venous stasis dermatitis ?No red flags. He has tried conservative management including salt avoidance, compression, and elevation. Advised him to follow up with vascular surgeon soon. We discussed starting lasix. Given his skin changes and lack of improvement with conservative management this would be reasonable. ? ?We will start lasix '20mg'$  daily. Discussed potential side effects. He will follow up in a couple of weeks and we can check labs at that time.  ? ?Leg edema ?No red flags. Has known venous reflux. Worsened recently. He thinks he may have had more salt intake recently. We discussed conservative management including leg elevation and compression stockings. Recently echocardiogram had normal EF. We discussed starting lasix. Given his continued symptoms despite conservative management, would be reasonable to start. ? ?We will send in lasix '20mg'$  daily as needed for leg swelling. He will follow up in a couple of weeks.  ? ? ?  ?Subjective:  ?HPI: ? ?Patient here with swelling on his right knee.  Symptoms started a couple of days ago. Happened suddenly. Tried using a compression sock which helped. Some back on the back of his calf and leg.  Has been able to walk normally. No injuries. No recent medication changes. He has not missed any doses of his xarelto. He has not noticed any recent increased salt intake.  ? ?He has also been recently dealing a compression fracture. He was recently started on calcitonin by orthopedics. He is not sure if this has helped.  ? ?   ?  ?Objective:  ?Physical Exam: ?BP 131/82 (BP Location: Left  Arm)   Pulse (!) 108   Temp (!) 97.5 ?F (36.4 ?C) (Temporal)   Ht 6' (1.829 m)   Wt 167 lb 3.2 oz (75.8 kg)   SpO2 97%   BMI 22.68 kg/m?   ?Gen: No acute distress, resting comfortably ?CV: Regular rate and rhythm with no murmurs appreciated ?Pulm: Normal work of breathing, clear to auscultation bilaterally with no crackles, wheezes, or rhonchi ?MSK: ? - Bilateral leg pitting edema right > left. Venous stasis changed noted bilaterally.  ?Neuro: Grossly normal, moves all extremities ?Psych: Normal affect and thought content ? ?Time Spent: ?45 minutes of total time was spent on the date of the encounter performing the following actions: chart review prior to seeing the patient, obtaining history, performing a medically necessary exam, counseling on the treatment plan, placing orders, and documenting in our EHR.  ? ? ?   ? ?[name]. [name], MD ?06/17/2021 4:22 PM  ? ?

## 2021-06-17 NOTE — Assessment & Plan Note (Signed)
Anticoagulated on xarelto - doubt recurrence.  ?

## 2021-06-17 NOTE — Assessment & Plan Note (Addendum)
No red flags. Has known venous reflux. Worsened recently. He thinks he may have had more salt intake recently. We discussed conservative management including leg elevation and compression stockings. Recently echocardiogram had normal EF. We discussed starting lasix. Given his continued symptoms despite conservative management, would be reasonable to start. ? ?We will send in lasix '20mg'$  daily as needed for leg swelling. He will follow up in a couple of weeks.  ?

## 2021-06-17 NOTE — Patient Instructions (Addendum)
It was very nice to see you today! ? ?We will refill  your tramadol today.  ? ?Please take the lasix daily as needed to help with the leg swelling. ? ?Please come back in 2 weeks to have your blood work rechecked.  ? ?Take care, ?Dr [name] ? ?PLEASE NOTE: ? ?If you had any lab tests please let us know if you have not heard back within a few days. You may see your results on mychart before we have a chance to review them but we will give you a call once they are reviewed by [place]. If we ordered any referrals today, please let us know if you have not heard from their office within the next week.  ? ?Please try these tips to maintain a healthy lifestyle: ? ?Eat at least 3 REAL meals and 1-2 snacks per day.  Aim for no more than 5 hours between eating.  If you eat breakfast, please do so within one hour of getting up.  ? ?Each meal should contain half fruits/vegetables, one quarter protein, and one quarter carbs (no bigger than a computer mouse) ? ?Cut down on sweet beverages. This includes juice, soda, and sweet tea.  ? ?Drink at least 1 glass of water with each meal and aim for at least 8 glasses per day ? ?Exercise at least 150 minutes every week.   ?

## 2021-06-17 NOTE — Telephone Encounter (Signed)
After hour message from pt -  VML FOR PT TO CALL BACK.  ? ?Pt states: --Caller states his Rt knee ( 6 inches below on his ?shin) started swelling yesterday and today it has some ?bruising/redness on it. He explained it as looking like ?a second knee cap. Started yesterday but he thought it ?was getting better by wearing his compression socks ?(states he has bad valves in that leg). On Xarelto (no ?missed doses) for DVT's (in 2017 and 2021). Today ?it continues - not as bad as yesterday. Feels sore 4/10. ?Doesn't remember hurting it - he questions if it was his ?sock being too tight yesterday. Has walked on his leg ?today. ? ? ? ? ?

## 2021-06-18 DIAGNOSIS — N200 Calculus of kidney: Secondary | ICD-10-CM | POA: Diagnosis not present

## 2021-06-18 DIAGNOSIS — S22080A Wedge compression fracture of T11-T12 vertebra, initial encounter for closed fracture: Secondary | ICD-10-CM | POA: Diagnosis not present

## 2021-06-18 DIAGNOSIS — K5730 Diverticulosis of large intestine without perforation or abscess without bleeding: Secondary | ICD-10-CM | POA: Diagnosis not present

## 2021-06-18 DIAGNOSIS — R3121 Asymptomatic microscopic hematuria: Secondary | ICD-10-CM | POA: Diagnosis not present

## 2021-06-18 DIAGNOSIS — R3129 Other microscopic hematuria: Secondary | ICD-10-CM | POA: Diagnosis not present

## 2021-06-19 DIAGNOSIS — L728 Other follicular cysts of the skin and subcutaneous tissue: Secondary | ICD-10-CM | POA: Diagnosis not present

## 2021-06-19 DIAGNOSIS — L821 Other seborrheic keratosis: Secondary | ICD-10-CM | POA: Diagnosis not present

## 2021-06-19 DIAGNOSIS — L280 Lichen simplex chronicus: Secondary | ICD-10-CM | POA: Diagnosis not present

## 2021-06-19 DIAGNOSIS — L538 Other specified erythematous conditions: Secondary | ICD-10-CM | POA: Diagnosis not present

## 2021-06-19 DIAGNOSIS — L570 Actinic keratosis: Secondary | ICD-10-CM | POA: Diagnosis not present

## 2021-06-19 DIAGNOSIS — D225 Melanocytic nevi of trunk: Secondary | ICD-10-CM | POA: Diagnosis not present

## 2021-06-19 DIAGNOSIS — L814 Other melanin hyperpigmentation: Secondary | ICD-10-CM | POA: Diagnosis not present

## 2021-06-20 ENCOUNTER — Other Ambulatory Visit: Payer: Self-pay | Admitting: Family Medicine

## 2021-06-20 ENCOUNTER — Other Ambulatory Visit: Payer: Medicare Other

## 2021-06-20 DIAGNOSIS — R600 Localized edema: Secondary | ICD-10-CM

## 2021-06-22 DIAGNOSIS — M810 Age-related osteoporosis without current pathological fracture: Secondary | ICD-10-CM | POA: Diagnosis not present

## 2021-06-24 DIAGNOSIS — M5412 Radiculopathy, cervical region: Secondary | ICD-10-CM | POA: Diagnosis not present

## 2021-06-24 DIAGNOSIS — G603 Idiopathic progressive neuropathy: Secondary | ICD-10-CM | POA: Diagnosis not present

## 2021-06-24 DIAGNOSIS — M5417 Radiculopathy, lumbosacral region: Secondary | ICD-10-CM | POA: Diagnosis not present

## 2021-06-24 DIAGNOSIS — N400 Enlarged prostate without lower urinary tract symptoms: Secondary | ICD-10-CM | POA: Diagnosis not present

## 2021-06-24 DIAGNOSIS — G5603 Carpal tunnel syndrome, bilateral upper limbs: Secondary | ICD-10-CM | POA: Diagnosis not present

## 2021-06-24 LAB — PSA: PSA: 0.91

## 2021-06-25 DIAGNOSIS — M9904 Segmental and somatic dysfunction of sacral region: Secondary | ICD-10-CM | POA: Diagnosis not present

## 2021-06-25 DIAGNOSIS — M9905 Segmental and somatic dysfunction of pelvic region: Secondary | ICD-10-CM | POA: Diagnosis not present

## 2021-06-25 DIAGNOSIS — M9903 Segmental and somatic dysfunction of lumbar region: Secondary | ICD-10-CM | POA: Diagnosis not present

## 2021-06-25 DIAGNOSIS — M5137 Other intervertebral disc degeneration, lumbosacral region: Secondary | ICD-10-CM | POA: Diagnosis not present

## 2021-06-26 DIAGNOSIS — M439 Deforming dorsopathy, unspecified: Secondary | ICD-10-CM | POA: Diagnosis not present

## 2021-06-26 DIAGNOSIS — S22080A Wedge compression fracture of T11-T12 vertebra, initial encounter for closed fracture: Secondary | ICD-10-CM | POA: Diagnosis not present

## 2021-06-26 DIAGNOSIS — M545 Low back pain, unspecified: Secondary | ICD-10-CM | POA: Diagnosis not present

## 2021-06-26 DIAGNOSIS — M549 Dorsalgia, unspecified: Secondary | ICD-10-CM | POA: Diagnosis not present

## 2021-06-26 DIAGNOSIS — Z01818 Encounter for other preprocedural examination: Secondary | ICD-10-CM | POA: Diagnosis not present

## 2021-06-27 ENCOUNTER — Other Ambulatory Visit: Payer: Self-pay | Admitting: Pain Medicine

## 2021-06-27 DIAGNOSIS — M9903 Segmental and somatic dysfunction of lumbar region: Secondary | ICD-10-CM | POA: Diagnosis not present

## 2021-06-27 DIAGNOSIS — M9905 Segmental and somatic dysfunction of pelvic region: Secondary | ICD-10-CM | POA: Diagnosis not present

## 2021-06-27 DIAGNOSIS — M9904 Segmental and somatic dysfunction of sacral region: Secondary | ICD-10-CM | POA: Diagnosis not present

## 2021-06-27 DIAGNOSIS — M5137 Other intervertebral disc degeneration, lumbosacral region: Secondary | ICD-10-CM | POA: Diagnosis not present

## 2021-07-01 ENCOUNTER — Other Ambulatory Visit (INDEPENDENT_AMBULATORY_CARE_PROVIDER_SITE_OTHER): Payer: Medicare Other

## 2021-07-01 DIAGNOSIS — R600 Localized edema: Secondary | ICD-10-CM | POA: Diagnosis not present

## 2021-07-01 LAB — CBC
HCT: 42.2 % (ref 39.0–52.0)
Hemoglobin: 14.5 g/dL (ref 13.0–17.0)
MCHC: 34.3 g/dL (ref 30.0–36.0)
MCV: 90.7 fl (ref 78.0–100.0)
Platelets: 194 10*3/uL (ref 150.0–400.0)
RBC: 4.65 Mil/uL (ref 4.22–5.81)
RDW: 14.6 % (ref 11.5–15.5)
WBC: 6.9 10*3/uL (ref 4.0–10.5)

## 2021-07-01 LAB — COMPREHENSIVE METABOLIC PANEL
ALT: 17 U/L (ref 0–53)
AST: 19 U/L (ref 0–37)
Albumin: 3.8 g/dL (ref 3.5–5.2)
Alkaline Phosphatase: 109 U/L (ref 39–117)
BUN: 12 mg/dL (ref 6–23)
CO2: 29 mEq/L (ref 19–32)
Calcium: 8.8 mg/dL (ref 8.4–10.5)
Chloride: 105 mEq/L (ref 96–112)
Creatinine, Ser: 0.84 mg/dL (ref 0.40–1.50)
GFR: 88.5 mL/min (ref >60.00)
Glucose, Bld: 83 mg/dL (ref 70–99)
Potassium: 3.1 mEq/L — ABNORMAL LOW (ref 3.5–5.1)
Sodium: 141 mEq/L (ref 135–145)
Total Bilirubin: 0.9 mg/dL (ref 0.2–1.2)
Total Protein: 6.2 g/dL (ref 6.0–8.3)

## 2021-07-02 ENCOUNTER — Encounter: Payer: Self-pay | Admitting: Family Medicine

## 2021-07-02 ENCOUNTER — Ambulatory Visit (INDEPENDENT_AMBULATORY_CARE_PROVIDER_SITE_OTHER): Payer: Medicare Other | Admitting: Family Medicine

## 2021-07-02 VITALS — BP 138/91 | HR 83 | Ht 72.0 in | Wt 164.8 lb

## 2021-07-02 DIAGNOSIS — I824Z1 Acute embolism and thrombosis of unspecified deep veins of right distal lower extremity: Secondary | ICD-10-CM

## 2021-07-02 DIAGNOSIS — E876 Hypokalemia: Secondary | ICD-10-CM | POA: Diagnosis not present

## 2021-07-02 DIAGNOSIS — I872 Venous insufficiency (chronic) (peripheral): Secondary | ICD-10-CM

## 2021-07-02 DIAGNOSIS — L728 Other follicular cysts of the skin and subcutaneous tissue: Secondary | ICD-10-CM | POA: Diagnosis not present

## 2021-07-02 DIAGNOSIS — R600 Localized edema: Secondary | ICD-10-CM

## 2021-07-02 DIAGNOSIS — L538 Other specified erythematous conditions: Secondary | ICD-10-CM | POA: Diagnosis not present

## 2021-07-02 DIAGNOSIS — L0202 Furuncle of face: Secondary | ICD-10-CM | POA: Diagnosis not present

## 2021-07-02 DIAGNOSIS — N200 Calculus of kidney: Secondary | ICD-10-CM

## 2021-07-02 MED ORDER — POTASSIUM CHLORIDE CRYS ER 10 MEQ PO TBCR: 10.0000 meq | EXTENDED_RELEASE_TABLET | Freq: Every day | ORAL | 0 refills | Status: DC

## 2021-07-02 NOTE — Progress Notes (Signed)
Please inform patient of the following: ? ?His potassium has dropped a bit. This is probably due to his lasix. Recommend starting potassium chloride 76mq daily with his lasix. Please send in new rx. We can recheck in 1-2 weeks. ? ?David Armstrong PJerline Pain MD ?07/02/2021 8:05 AM  ?

## 2021-07-02 NOTE — Assessment & Plan Note (Signed)
No red flags. Symptoms have not changed much. Recommend he follow up with vascular surgeon soon.  ?

## 2021-07-02 NOTE — Progress Notes (Signed)
? ?  David Armstrong is a 71 y.o. male who presents today for an office visit. ? ?Assessment/Plan:  ?New/Acute Problems: ?Sinusitis ?No red flags. Reassuring that symptoms have been improving the last day or so. Will continue with watchful waiting. He can continue OTC medications. He will let me know if not improving.  ? ?Chronic Problems Addressed Today: ?DVT (deep venous thrombosis) ([place]) ?Anticoagulated on xarelto indefinitely. Doubt any recurrence.  ? ?Leg edema ?No red flags. No significant improvement since our last visit though he has not been taking lasix. Advised patient he should restart and take daily as needed. He is already working with conservative management including salt avoidance, leg elevation, and compression.  ? ?He will follow up with me in a few weeks if not improving though will likely need to go back to vascular surgery if this continues to be an issue. We will need to recheck electrolytes in a few weeks if he is taking his lasix consistently.  ? ?Nephrolithiasis ?Had a recent recurrence. He will follow up with urology soon.  ? ?Venous stasis dermatitis ?No red flags. Symptoms have not changed much. Recommend he follow up with vascular surgeon soon.  ? ?Hypokalemia ?Likely due to lasix use. Will start supplement 14mq to take when he takes lasix. HE will recheck in 1-2 weeks.  ? ? ?  ?Subjective:  ?HPI: ? ?Patient here for follow up for leg edema. We last saw him about 2 weeks ago for this and started him on lasix. He took this for a few days. He does not remember if this helped significantly. He is still having a lot of swelling and pain in his right leg. He has not taken lasix in over a week. He has been using compression stockings routinely and trying to cut down on his salt. He has been trying to keep his legs elevated.   ? ?He has had a low bit of sinus congestion for the last few days as well.  Some green mucus production.  This seems to be improving the last day or so.  No fevers or  chills.  No known sick contacts. ? ?See A/p for status of chronic medications.  ? ?   ?  ?Objective:  ?Physical Exam: ?BP (!) 138/91 (BP Location: Left Arm)   Pulse 83   Ht 6' (1.829 m)   Wt 164 lb 12.8 oz (74.8 kg)   SpO2 99%   BMI 22.35 kg/m?   ?Gen: No acute distress, resting comfortably ?CV: Regular rate and rhythm with no murmurs appreciated ?Pulm: Normal work of breathing, clear to auscultation bilaterally with no crackles, wheezes, or rhonchi ?MSK: ?- Bilateral leg pitting edema right > left. Venous stasis changes noted.  ?Neuro: Grossly normal, moves all extremities ?Psych: Normal affect and thought content ? ?Time Spent: ?45 minutes of total time was spent on the date of the encounter performing the following actions: chart review prior to seeing the patient, obtaining history, performing a medically necessary exam, counseling on the treatment plan including need to take potassium supplement, placing orders, and documenting in our EHR.  ? ? ?   ? ?CAlgis Greenhouse PJerline Pain MD ?07/02/2021 12:37 PM  ?

## 2021-07-02 NOTE — Assessment & Plan Note (Signed)
Had a recent recurrence. He will follow up with urology soon.  ?

## 2021-07-02 NOTE — Assessment & Plan Note (Signed)
Likely due to lasix use. Will start supplement 54mq to take when he takes lasix. HE will recheck in 1-2 weeks.  ?

## 2021-07-02 NOTE — Assessment & Plan Note (Signed)
Anticoagulated on xarelto indefinitely. Doubt any recurrence.  ?

## 2021-07-02 NOTE — Patient Instructions (Addendum)
It was very nice to see you today! ? ?Please take the lasix daily as needed. ? ?Please come back in 1-2 weeks to recheck your blood work.  ? ?Please follow up with the vascular doctor soon.  ? ?Take care, ?Dr [name] ? ?PLEASE NOTE: ? ?If you had any lab tests please let us know if you have not heard back within a few days. You may see your results on mychart before we have a chance to review them but we will give you a call once they are reviewed by [place]. If we ordered any referrals today, please let us know if you have not heard from their office within the next week.  ? ?Please try these tips to maintain a healthy lifestyle: ? ?Eat at least 3 REAL meals and 1-2 snacks per day.  Aim for no more than 5 hours between eating.  If you eat breakfast, please do so within one hour of getting up.  ? ?Each meal should contain half fruits/vegetables, one quarter protein, and one quarter carbs (no bigger than a computer mouse) ? ?Cut down on sweet beverages. This includes juice, soda, and sweet tea.  ? ?Drink at least 1 glass of water with each meal and aim for at least 8 glasses per day ? ?Exercise at least 150 minutes every week.   ?

## 2021-07-02 NOTE — Assessment & Plan Note (Addendum)
No red flags. No significant improvement since our last visit though he has not been taking lasix. Advised patient he should restart and take daily as needed. He is already working with conservative management including salt avoidance, leg elevation, and compression.  ? ?He will follow up with me in a few weeks if not improving though will likely need to go back to vascular surgery if this continues to be an issue. We will need to recheck electrolytes in a few weeks if he is taking his lasix consistently.  ?

## 2021-07-09 ENCOUNTER — Other Ambulatory Visit (INDEPENDENT_AMBULATORY_CARE_PROVIDER_SITE_OTHER): Payer: Medicare Other

## 2021-07-09 DIAGNOSIS — R600 Localized edema: Secondary | ICD-10-CM

## 2021-07-09 DIAGNOSIS — E876 Hypokalemia: Secondary | ICD-10-CM | POA: Diagnosis not present

## 2021-07-09 LAB — COMPREHENSIVE METABOLIC PANEL
ALT: 17 U/L (ref 0–53)
AST: 19 U/L (ref 0–37)
Albumin: 3.9 g/dL (ref 3.5–5.2)
Alkaline Phosphatase: 133 U/L — ABNORMAL HIGH (ref 39–117)
BUN: 15 mg/dL (ref 6–23)
CO2: 27 mEq/L (ref 19–32)
Calcium: 9.3 mg/dL (ref 8.4–10.5)
Chloride: 107 mEq/L (ref 96–112)
Creatinine, Ser: 0.82 mg/dL (ref 0.40–1.50)
GFR: 89.13 mL/min (ref >60.00)
Glucose, Bld: 80 mg/dL (ref 70–99)
Potassium: 3.9 mEq/L (ref 3.5–5.1)
Sodium: 141 mEq/L (ref 135–145)
Total Bilirubin: 0.8 mg/dL (ref 0.2–1.2)
Total Protein: 6.4 g/dL (ref 6.0–8.3)

## 2021-07-10 DIAGNOSIS — M9903 Segmental and somatic dysfunction of lumbar region: Secondary | ICD-10-CM | POA: Diagnosis not present

## 2021-07-10 DIAGNOSIS — M5137 Other intervertebral disc degeneration, lumbosacral region: Secondary | ICD-10-CM | POA: Diagnosis not present

## 2021-07-10 DIAGNOSIS — M9905 Segmental and somatic dysfunction of pelvic region: Secondary | ICD-10-CM | POA: Diagnosis not present

## 2021-07-10 DIAGNOSIS — M9904 Segmental and somatic dysfunction of sacral region: Secondary | ICD-10-CM | POA: Diagnosis not present

## 2021-07-11 DIAGNOSIS — M9904 Segmental and somatic dysfunction of sacral region: Secondary | ICD-10-CM | POA: Diagnosis not present

## 2021-07-11 DIAGNOSIS — M5137 Other intervertebral disc degeneration, lumbosacral region: Secondary | ICD-10-CM | POA: Diagnosis not present

## 2021-07-11 DIAGNOSIS — M9905 Segmental and somatic dysfunction of pelvic region: Secondary | ICD-10-CM | POA: Diagnosis not present

## 2021-07-11 DIAGNOSIS — M9903 Segmental and somatic dysfunction of lumbar region: Secondary | ICD-10-CM | POA: Diagnosis not present

## 2021-07-11 NOTE — Progress Notes (Signed)
Please inform patient of the following: ? ?Potassium is back to normal. Do not need to do any more testing at this time. ? ?[name]. [name], MD ?07/11/2021 3:30 PM  ?

## 2021-07-12 ENCOUNTER — Encounter: Payer: Self-pay | Admitting: Family Medicine

## 2021-07-12 ENCOUNTER — Ambulatory Visit (INDEPENDENT_AMBULATORY_CARE_PROVIDER_SITE_OTHER): Payer: Medicare Other | Admitting: Family Medicine

## 2021-07-12 VITALS — BP 152/84 | HR 101 | Temp 98.9°F | Ht 72.0 in | Wt 164.0 lb

## 2021-07-12 DIAGNOSIS — K5900 Constipation, unspecified: Secondary | ICD-10-CM | POA: Diagnosis not present

## 2021-07-12 DIAGNOSIS — F411 Generalized anxiety disorder: Secondary | ICD-10-CM

## 2021-07-12 DIAGNOSIS — K589 Irritable bowel syndrome without diarrhea: Secondary | ICD-10-CM

## 2021-07-12 MED ORDER — LINACLOTIDE 145 MCG PO CAPS: 145.0000 ug | ORAL_CAPSULE | Freq: Every day | ORAL | 0 refills | Status: DC

## 2021-07-12 NOTE — Assessment & Plan Note (Signed)
Could be contributing to his constipation issues. Will be treating constipation as above. He is on hyoscyamine 278mg every 6 hours as needed. Recommended he follow up with GI soon.  ?

## 2021-07-12 NOTE — Patient Instructions (Signed)
It was very nice to see you today! ? ?Please take as much miralax as needed to have 1-2 soft bowel movements daily. If not improving please try using a glycerin suppository. ? ?Please take the linzess if still not improving.  ? ?Let me know if not improving by next week.  ? ?Take care, ?Dr [name] ? ?PLEASE NOTE: ? ?If you had any lab tests please let us know if you have not heard back within a few days. You may see your results on mychart before we have a chance to review them but we will give you a call once they are reviewed by [place]. If we ordered any referrals today, please let us know if you have not heard from their office within the next week.  ? ?Please try these tips to maintain a healthy lifestyle: ? ?Eat at least 3 REAL meals and 1-2 snacks per day.  Aim for no more than 5 hours between eating.  If you eat breakfast, please do so within one hour of getting up.  ? ?Each meal should contain half fruits/vegetables, one quarter protein, and one quarter carbs (no bigger than a computer mouse) ? ?Cut down on sweet beverages. This includes juice, soda, and sweet tea.  ? ?Drink at least 1 glass of water with each meal and aim for at least 8 glasses per day ? ?Exercise at least 150 minutes every week.   ?

## 2021-07-12 NOTE — Assessment & Plan Note (Signed)
Managed by psychiatry. HE is on ativan which could be contributing to his constipation.  ?

## 2021-07-12 NOTE — Progress Notes (Signed)
? ?  David Armstrong is a 71 y.o. male who presents today for an office visit. ? ?Assessment/Plan:  ?New/Acute Problems: ?Constipation ?No red flags. Recommend miralax for the next couple of days. Also recommended suppository if needed. Will send in a prescription for linzess to use if not improving in the next few days. Recommended good hydration.  ? ?Back pain ?Secondary to vertebral compression fracture.  He had planned on having kyphoplasty but had to postpone by a month.  Pain is worsened due to constipation.  Recommended avoidance of narcotics for now.  Hopefully will have some improvement once we improve his constipation as above. ? ?Chronic Problems Addressed Today: ?IBS (irritable bowel syndrome) ?Could be contributing to his constipation issues. Will be treating constipation as above. He is on hyoscyamine 264mg every 6 hours as needed. Recommended he follow up with GI soon.  ? ?Anxiety state ?Managed by psychiatry. HE is on ativan which could be contributing to his constipation.  ? ? ?  ?Subjective:  ?HPI: ? ?Patient here with concern for constipation. Has had difficulty with bowel movement for the last several days. Just started taking a stool softener today with colace. He has not noticed much difference. Tried to have a BM this afternoon and not much came out.  He was recently on doxycline for prophylaxis at the direction of his dermatologist after having some skin lesions injected. No nausea or vomiting. No fevers or chills. No melena or hematochezia.  ? ?   ?  ?Objective:  ?Physical Exam: ?BP (!) 152/84 (BP Location: Right Arm)   Pulse (!) 101   Temp 98.9 ?F (37.2 ?C) (Temporal)   Ht 6' (1.829 m)   Wt 164 lb (74.4 kg)   SpO2 96%   BMI 22.24 kg/m?   ?Gen: No acute distress, resting comfortably ?Neuro: Grossly normal, moves all extremities ?Psych: Normal affect and thought content ? ?Time Spent: ?40 minutes of total time was spent on the date of the encounter performing the following actions:  chart review prior to seeing the patient, obtaining history, performing a medically necessary exam, counseling on the treatment plan including trial of miralax prior to trying linzess, placing orders, and documenting in our EHR.  ? ? ?   ? ?CAlgis Greenhouse PJerline Pain MD ?07/12/2021 3:20 PM  ?

## 2021-07-15 DIAGNOSIS — M9904 Segmental and somatic dysfunction of sacral region: Secondary | ICD-10-CM | POA: Diagnosis not present

## 2021-07-15 DIAGNOSIS — M9905 Segmental and somatic dysfunction of pelvic region: Secondary | ICD-10-CM | POA: Diagnosis not present

## 2021-07-15 DIAGNOSIS — M9903 Segmental and somatic dysfunction of lumbar region: Secondary | ICD-10-CM | POA: Diagnosis not present

## 2021-07-15 DIAGNOSIS — M5137 Other intervertebral disc degeneration, lumbosacral region: Secondary | ICD-10-CM | POA: Diagnosis not present

## 2021-07-17 DIAGNOSIS — M9903 Segmental and somatic dysfunction of lumbar region: Secondary | ICD-10-CM | POA: Diagnosis not present

## 2021-07-17 DIAGNOSIS — M9904 Segmental and somatic dysfunction of sacral region: Secondary | ICD-10-CM | POA: Diagnosis not present

## 2021-07-17 DIAGNOSIS — M5137 Other intervertebral disc degeneration, lumbosacral region: Secondary | ICD-10-CM | POA: Diagnosis not present

## 2021-07-17 DIAGNOSIS — M9905 Segmental and somatic dysfunction of pelvic region: Secondary | ICD-10-CM | POA: Diagnosis not present

## 2021-07-18 DIAGNOSIS — M9903 Segmental and somatic dysfunction of lumbar region: Secondary | ICD-10-CM | POA: Diagnosis not present

## 2021-07-18 DIAGNOSIS — M9905 Segmental and somatic dysfunction of pelvic region: Secondary | ICD-10-CM | POA: Diagnosis not present

## 2021-07-18 DIAGNOSIS — M5137 Other intervertebral disc degeneration, lumbosacral region: Secondary | ICD-10-CM | POA: Diagnosis not present

## 2021-07-18 DIAGNOSIS — M9904 Segmental and somatic dysfunction of sacral region: Secondary | ICD-10-CM | POA: Diagnosis not present

## 2021-07-19 DIAGNOSIS — Z20822 Contact with and (suspected) exposure to covid-19: Secondary | ICD-10-CM | POA: Diagnosis not present

## 2021-07-22 DIAGNOSIS — M9903 Segmental and somatic dysfunction of lumbar region: Secondary | ICD-10-CM | POA: Diagnosis not present

## 2021-07-22 DIAGNOSIS — M9905 Segmental and somatic dysfunction of pelvic region: Secondary | ICD-10-CM | POA: Diagnosis not present

## 2021-07-22 DIAGNOSIS — M5137 Other intervertebral disc degeneration, lumbosacral region: Secondary | ICD-10-CM | POA: Diagnosis not present

## 2021-07-22 DIAGNOSIS — M9904 Segmental and somatic dysfunction of sacral region: Secondary | ICD-10-CM | POA: Diagnosis not present

## 2021-07-23 ENCOUNTER — Telehealth: Payer: Self-pay | Admitting: Family Medicine

## 2021-07-23 ENCOUNTER — Encounter (HOSPITAL_BASED_OUTPATIENT_CLINIC_OR_DEPARTMENT_OTHER): Payer: Self-pay | Admitting: Emergency Medicine

## 2021-07-23 ENCOUNTER — Emergency Department (HOSPITAL_BASED_OUTPATIENT_CLINIC_OR_DEPARTMENT_OTHER)
Admission: EM | Admit: 2021-07-23 | Discharge: 2021-07-23 | Disposition: A | Discharge status: Home / Self Care | Payer: Medicare Other | Attending: Emergency Medicine | Admitting: Emergency Medicine

## 2021-07-23 ENCOUNTER — Emergency Department (HOSPITAL_BASED_OUTPATIENT_CLINIC_OR_DEPARTMENT_OTHER): Payer: Medicare Other

## 2021-07-23 ENCOUNTER — Other Ambulatory Visit: Payer: Self-pay

## 2021-07-23 DIAGNOSIS — S22000A Wedge compression fracture of unspecified thoracic vertebra, initial encounter for closed fracture: Secondary | ICD-10-CM

## 2021-07-23 DIAGNOSIS — N200 Calculus of kidney: Secondary | ICD-10-CM | POA: Diagnosis not present

## 2021-07-23 DIAGNOSIS — Z7901 Long term (current) use of anticoagulants: Secondary | ICD-10-CM | POA: Diagnosis not present

## 2021-07-23 DIAGNOSIS — K449 Diaphragmatic hernia without obstruction or gangrene: Secondary | ICD-10-CM | POA: Diagnosis not present

## 2021-07-23 DIAGNOSIS — X58XXXA Exposure to other specified factors, initial encounter: Secondary | ICD-10-CM | POA: Diagnosis not present

## 2021-07-23 DIAGNOSIS — R109 Unspecified abdominal pain: Secondary | ICD-10-CM | POA: Diagnosis present

## 2021-07-23 DIAGNOSIS — S32010A Wedge compression fracture of first lumbar vertebra, initial encounter for closed fracture: Secondary | ICD-10-CM | POA: Diagnosis not present

## 2021-07-23 DIAGNOSIS — S22080D Wedge compression fracture of T11-T12 vertebra, subsequent encounter for fracture with routine healing: Secondary | ICD-10-CM | POA: Diagnosis not present

## 2021-07-23 DIAGNOSIS — R1084 Generalized abdominal pain: Secondary | ICD-10-CM | POA: Insufficient documentation

## 2021-07-23 DIAGNOSIS — K5730 Diverticulosis of large intestine without perforation or abscess without bleeding: Secondary | ICD-10-CM | POA: Diagnosis not present

## 2021-07-23 DIAGNOSIS — S32010D Wedge compression fracture of first lumbar vertebra, subsequent encounter for fracture with routine healing: Secondary | ICD-10-CM | POA: Diagnosis not present

## 2021-07-23 DIAGNOSIS — S32000G Wedge compression fracture of unspecified lumbar vertebra, subsequent encounter for fracture with delayed healing: Secondary | ICD-10-CM

## 2021-07-23 DIAGNOSIS — S32020D Wedge compression fracture of second lumbar vertebra, subsequent encounter for fracture with routine healing: Secondary | ICD-10-CM | POA: Diagnosis not present

## 2021-07-23 LAB — CBC
HCT: 41.4 % (ref 39.0–52.0)
Hemoglobin: 13.7 g/dL (ref 13.0–17.0)
MCH: 30.1 pg (ref 26.0–34.0)
MCHC: 33.1 g/dL (ref 30.0–36.0)
MCV: 91 fL (ref 80.0–100.0)
Platelets: 221 10*3/uL (ref 150–400)
RBC: 4.55 MIL/uL (ref 4.22–5.81)
RDW: 14.3 % (ref 11.5–15.5)
WBC: 6.4 10*3/uL (ref 4.0–10.5)
nRBC: 0 % (ref 0.0–0.2)

## 2021-07-23 LAB — COMPREHENSIVE METABOLIC PANEL
ALT: 13 U/L (ref 0–44)
AST: 17 U/L (ref 15–41)
Albumin: 3.9 g/dL (ref 3.5–5.0)
Alkaline Phosphatase: 123 U/L (ref 38–126)
Anion gap: 6 (ref 5–15)
BUN: 13 mg/dL (ref 8–23)
CO2: 29 mmol/L (ref 22–32)
Calcium: 9 mg/dL (ref 8.9–10.3)
Chloride: 106 mmol/L (ref 98–111)
Creatinine, Ser: 0.8 mg/dL (ref 0.61–1.24)
GFR, Estimated: >60 mL/min (ref >60)
Glucose, Bld: 103 mg/dL — ABNORMAL HIGH (ref 70–99)
Potassium: 3.6 mmol/L (ref 3.5–5.1)
Sodium: 141 mmol/L (ref 135–145)
Total Bilirubin: 0.8 mg/dL (ref 0.3–1.2)
Total Protein: 6.5 g/dL (ref 6.5–8.1)

## 2021-07-23 LAB — URINALYSIS, ROUTINE W REFLEX MICROSCOPIC
Bilirubin Urine: NEGATIVE
Glucose, UA: NEGATIVE mg/dL
Ketones, ur: NEGATIVE mg/dL
Leukocytes,Ua: NEGATIVE
Nitrite: NEGATIVE
Specific Gravity, Urine: 1.018 (ref 1.005–1.030)
pH: 6 (ref 5.0–8.0)

## 2021-07-23 LAB — LIPASE, BLOOD: Lipase: 12 U/L (ref 11–51)

## 2021-07-23 NOTE — Telephone Encounter (Signed)
Received a call from [place] stating that they triaged pt and he refused to go to ER to be evaluated. Called pt and went over his symptoms, he stated it's a tightness feeling that he gets in his chest and abdomen, being going on for couple of weeks, getting worse and he feels it's hard to swallow sometimes, not often. I advised pt that it's very important to be evaluated at the ER to make sure it's not something serious. Explained that at ER they have all the imaging and equipment needed to to evaluate him, in opposite if he comes here and provider determine the need for more studies then he has to go to ER anyway. . Recommended to go to ER at [facility], that may be quicker since he is concern about wait time. Pt stated that he will go to ER at [facility].  ?

## 2021-07-23 NOTE — ED Provider Notes (Signed)
?[facility] ?Provider Note ? ? ?[name]: [id number] ?Arrival date & time: 07/23/21  1119 ? ?  ? ?History ? ?Chief Complaint  ?Patient presents with  ? Abdominal Pain  ? Back Pain  ? ? ?David Armstrong is a 71 y.o. male. ? ?Patient here with the complaint of abdominal pain has been going on for several weeks of pains all over.  Appears that he was seen on March 31 by primary care doctor and treated for concern for constipation.  Has not had a CT scan of the abdomen recently.  Patient also known to have thoracic and lumbar compression fractures being followed by [facility] from orthopedics.  They are planning surgery on May 1 for this.  Patient denies any new fall or injury.  Denies any nausea or vomiting.  Patient reports that he is got abnormalities at T8 T12-L1 and L2. ? ?Past medical history significant for dye allergy.  May only be to IVP dye but he has not had a CT scan with contrast for long.  Time.  His irritable bowel syndrome known to have chronic constipation bipolar disorder gastroesophageal reflux disease diverticulosis of the colon history of pulmonary embolism in 2017 history of renal stone and history of ureteral stone hyperlipidemia anxiety depression. ? ? ?  ? ?Home Medications ?Prior to Admission medications   ?Medication Sig Start Date End Date Taking? Authorizing Provider  ?betamethasone dipropionate 0.05 % cream Apply topically 2 (two) times daily. 10/10/20   Vivi Barrack, MD  ?calcitonin, salmon, (MIACALCIN) 200 UNIT/ACT nasal spray Place 1 spray into alternate nostrils daily. 06/12/21   [name], DO  ?desonide (DESOWEN) 0.05 % lotion APPLY TOPICALLY TO THE AFFECTED AREA EVERY DAY AS NEEDED 05/31/21   Vivi Barrack, MD  ?Elastic Bandages & Supports (MEDICAL COMPRESSION STOCKINGS) MISC Use daily. 07/13/20   Vivi Barrack, MD  ?fluticasone Asencion Islam) 50 MCG/ACT nasal spray Place 2 sprays into both nostrils daily. 08/20/20   Vivi Barrack, MD  ?furosemide (LASIX) 20  MG tablet Take 1 tablet (20 mg total) by mouth daily as needed. 06/17/21   Vivi Barrack, MD  ?hyoscyamine (LEVSIN SL) 0.125 MG SL tablet Take 2 tablets (0.25 mg total) by mouth every 6 (six) hours as needed. DISSOLVE 1 TABLET(0.125 MG) UNDER THE TONGUE EVERY 6 HOURS AS NEEDED 04/09/21   Inda Coke, PA  ?linaclotide [facility]) 145 MCG CAPS capsule Take 1 capsule (145 mcg total) by mouth daily before breakfast. 07/12/21   Vivi Barrack, MD  ?LORazepam (ATIVAN) 1 MG tablet Take 0.5-1 tablets (0.5-1 mg total) by mouth every 8 (eight) hours as needed for anxiety. 05/26/17   Vivi Barrack, MD  ?omeprazole (PRILOSEC) 20 MG capsule TAKE 1 CAPSULE BY MOUTH EVERY DAY AS NEEDED 03/26/16   [provider]  ?potassium chloride (KLOR-CON M) 10 MEQ tablet Take 1 tablet (10 mEq total) by mouth daily. 07/02/21   Vivi Barrack, MD  ?rivaroxaban (XARELTO) 20 MG TABS tablet Take 1 tablet (20 mg total) by mouth daily with supper. Start 20 mg daily tablets after completion of 3 weeks of 15 mg BID dosing. 02/10/20   Inda Coke, PA  ?tamsulosin (FLOMAX) 0.4 MG CAPS capsule Take 0.4 mg by mouth daily. 12/10/20   [provider]  ?tiZANidine (ZANAFLEX) 4 MG tablet Take 1 tablet (4 mg total) by mouth every 6 (six) hours as needed for muscle spasms. 04/25/21   Vivi Barrack, MD  ?   ? ?Allergies    ?  Effexor xr [venlafaxine hcl er], Acyclovir and related, Zocor [simvastatin], Dexilant [dexlansoprazole], Ivp dye [iodinated contrast media], Soma [carisoprodol], and Sulfa antibiotics   ? ?Review of Systems   ?Review of Systems  ?Constitutional:  Negative for chills and fever.  ?HENT:  Negative for ear pain and sore throat.   ?Eyes:  Negative for pain and visual disturbance.  ?Respiratory:  Negative for cough and shortness of breath.   ?Cardiovascular:  Negative for chest pain and palpitations.  ?Gastrointestinal:  Positive for abdominal pain and constipation. Negative for diarrhea, nausea and vomiting.   ?Genitourinary:  Negative for dysuria and hematuria.  ?Musculoskeletal:  Positive for back pain. Negative for arthralgias.  ?Skin:  Negative for color change and rash.  ?Neurological:  Negative for seizures and syncope.  ?All other systems reviewed and are negative. ? ?Physical Exam ?Updated Vital Signs ?BP 126/82   Pulse 86   Temp 98.3 ?F (36.8 ?C)   Resp 19   Ht 1.829 m (6')   Wt 74.8 kg   SpO2 98%   BMI 22.38 kg/m?  ?Physical Exam ?Vitals and nursing note reviewed.  ?Constitutional:   ?   General: He is not in acute distress. ?   Appearance: Normal appearance. He is well-developed. He is not ill-appearing.  ?HENT:  ?   Head: Normocephalic and atraumatic.  ?Eyes:  ?   Extraocular Movements: Extraocular movements intact.  ?   Conjunctiva/sclera: Conjunctivae normal.  ?   Pupils: Pupils are equal, round, and reactive to light.  ?Cardiovascular:  ?   Rate and Rhythm: Normal rate and regular rhythm.  ?   Heart sounds: No murmur heard. ?Pulmonary:  ?   Effort: Pulmonary effort is normal. No respiratory distress.  ?   Breath sounds: Normal breath sounds.  ?Abdominal:  ?   General: There is no distension.  ?   Palpations: Abdomen is soft.  ?   Tenderness: There is no abdominal tenderness. There is no guarding.  ?Musculoskeletal:     ?   General: Tenderness present. No swelling.  ?   Cervical back: Neck supple.  ?   Comments: There is palpation at the thoracic lumbar junction  ?Skin: ?   General: Skin is warm and dry.  ?   Capillary Refill: Capillary refill takes less than 2 seconds.  ?Neurological:  ?   General: No focal deficit present.  ?   Mental Status: He is alert and oriented to person, place, and time.  ?Psychiatric:     ?   Mood and Affect: Mood normal.  ? ? ?ED Results / Procedures / Treatments   ?Labs ?(all labs ordered are listed, but only abnormal results are displayed) ?Labs Reviewed  ?COMPREHENSIVE METABOLIC PANEL - Abnormal; Notable for the following components:  ?    Result Value  ? Glucose, Bld  103 (*)   ? All other components within normal limits  ?LIPASE, BLOOD  ?CBC  ?URINALYSIS, ROUTINE W REFLEX MICROSCOPIC  ? ? ?EKG ?None ? ?Radiology ?CT Abdomen Pelvis Wo Contrast ? ?Result Date: 07/23/2021 ?CLINICAL DATA:  Acute abdominal pain and back pain EXAM: CT ABDOMEN AND PELVIS WITHOUT CONTRAST TECHNIQUE: Multidetector CT imaging of the abdomen and pelvis was performed following the standard protocol without IV contrast. RADIATION DOSE REDUCTION: This exam was performed according to the departmental dose-optimization program which includes automated exposure control, adjustment of the mA and/or kV according to patient size and/or use of iterative reconstruction technique. COMPARISON:  06/25/2020 FINDINGS: Lower chest: Stable anterior right  lower lung fissural nodule, image 4/5 measuring 5 mm. Stability dates back to 2017. Minor basilar atelectasis. Normal heart size. Native coronary atherosclerosis. No pericardial or pleural effusion. Small to moderate hiatal hernia. Hepatobiliary: Limited without IV contrast. No large focal hepatic abnormality or biliary dilatation. Gallbladder nondistended. No acute inflammatory changes. Common bile duct nondilated. Pancreas: Unremarkable. No pancreatic ductal dilatation or surrounding inflammatory changes. Spleen: Normal in size without focal abnormality. Adrenals/Urinary Tract: Normal adrenal glands. Both kidneys demonstrate nonobstructing intrarenal calculi bilaterally, largest renal calculus in the left kidney lower pole measures 9 mm, image 51/3. No associated hydronephrosis, acute obstructive uropathy, hydroureter, or obstructing ureteral calculus. Bladder unremarkable. Stomach/Bowel: Negative for bowel obstruction, significant dilatation, ileus, or free air. Appendix not visualized. No acute inflammatory process in the right lower quadrant. Scattered colonic diverticulosis. No acute inflammatory changes. No free fluid, fluid collection, hemorrhage, hematoma, abscess  or ascites. Vascular/Lymphatic: Limited without IV contrast. Aorta atherosclerotic and tortuous. Negative for aneurysm. Iliac atherosclerosis and tortuosity as well. No bulky adenopathy. Reproductive: No significant find

## 2021-07-23 NOTE — Telephone Encounter (Signed)
Please see note.

## 2021-07-23 NOTE — Telephone Encounter (Signed)
Noted. Agree with dispo. ? ?[name]. [name], MD ?07/23/2021 1:12 PM  ? ?

## 2021-07-23 NOTE — Telephone Encounter (Signed)
Patient called wanting to see Dr [name] today - stated he is having chest tightness along with abdominal tightness- feels abdominal really hard - Patient stated its been going on for 1 week today is worse. Patient has been triaged- awaiting triage notes-  ?

## 2021-07-23 NOTE — ED Triage Notes (Signed)
Pt arrives to ED with c/o abdominal pain and back pain. He reports he has been experiencing abdominal tightness over the past three weeks. The tightness is located in the epigastric region. He reports the abdominal tightness is constant. He also reports constant back pain for months, he reports recent T12 and T8 fractures. The back pain is described as agonizing. No N/V/D/C. No incontinence, numbness, tingling.  ?

## 2021-07-23 NOTE — Telephone Encounter (Signed)
Patient currently at ED

## 2021-07-23 NOTE — Discharge Instructions (Addendum)
Follow-up with orthopedics.  Follow-up with your regular doctor.  CT scan does show multiple level compression fractures some may be new since March 2023.  CT scan of the abdomen without any acute findings.  Abdominal labs without any acute abnormalities.  Continue your tramadol for the pain supplement with hydrocodone you have at home.  Contact your orthopedic doctor regarding the scan results. ?

## 2021-07-23 NOTE — Telephone Encounter (Signed)
Pt is currently being seen at [facility] ? 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 ?Translation No ?Nurse Assessment ?Nurse: David Ruths, RN, [facility] Date/Time David Armstrong Time): 07/23/2021 9:57:33 AM ?Confirm and document reason for call. If ?symptomatic, describe symptoms. ?---Caller states he has chest tightness and abd tightness ?that has been going on for a couple of weeks he has ?a fx at t12 has reflux disease has a full feeling all the ?way the way through to his back some shortness of ?breath if he bends over . abdomen is tight as well ?Does the patient have any new or worsening ?symptoms? ---Yes ?Will a triage be completed? ---Yes ?Related visit to physician within the last 2 weeks? ---No ?Does the PT have any chronic conditions? (i.e. ?diabetes, asthma, this includes High risk factors for ?pregnancy, etc.) ?---No ?Is this a behavioral health or substance abuse call? ---No ?Guidelines ?Guideline Title Affirmed Question Affirmed Notes Nurse Date/Time (Eastern ?Time) ?Chest Pain Hip or leg fracture ?(broken bone) in past ?month (or had cast on ?leg or ankle in past ?month) ?David Ruths, RN, [facility] 07/23/2021 10:00:30 ?AM ?Disp. Time (Eastern ?Time) Disposition Final User ?07/23/2021 9:55:36 AM Send to Urgent Queue David Armstrong ?07/23/2021 10:04:24 AM Go to ED Now Yes  David Ruths, RN, [facility] ?Caller Disagree/Comply Disagree ?Caller Understands Yes ?PreDisposition Call Doctor ?Care Advice Given Per Guideline ?GO TO ED NOW: * You need to be seen in the Emergency Department. * Go to the ED at ___________ [place] now. ?Drive carefully. ANOTHER ADULT SHOULD DRIVE: * It is better and safer if another adult drives instead of you. BRING ?MEDICINES: * Bring a list of your current medicines when you go to the Emergency Department (ER). * Bring the pill bottles too. ?This will help the doctor (or NP/PA) to make certain you are taking the right medicines and the right dose. NOTHING BY MOUTH: ?* Do not eat or drink anything for now. CALL EMS IF: * Severe difficulty breathing occurs * Passes out or becomes too weak to ?stand * You become worse CARE ADVICE given per Chest Pain (Adult) guideline. ?Comments ?User: David Levy, RN Date/Time David Armstrong Time): 07/23/2021 10:07:31 AM ?David Armstrong at office made aware of patient refusal of ER and states she will take care of it. Patient is aware to expect ?a call from the office ?Referrals ?GO TO FACILITY REFUSED ?

## 2021-07-24 NOTE — Telephone Encounter (Signed)
Patient called to sch follow up visit on 07/23/21- patient will be seeing Dr [name] on 07/29/21 at 1040am - Patient was seen at ED on 07/23/21.  ?

## 2021-07-25 DIAGNOSIS — M9903 Segmental and somatic dysfunction of lumbar region: Secondary | ICD-10-CM | POA: Diagnosis not present

## 2021-07-25 DIAGNOSIS — M9905 Segmental and somatic dysfunction of pelvic region: Secondary | ICD-10-CM | POA: Diagnosis not present

## 2021-07-25 DIAGNOSIS — M9904 Segmental and somatic dysfunction of sacral region: Secondary | ICD-10-CM | POA: Diagnosis not present

## 2021-07-25 DIAGNOSIS — M5137 Other intervertebral disc degeneration, lumbosacral region: Secondary | ICD-10-CM | POA: Diagnosis not present

## 2021-07-29 ENCOUNTER — Ambulatory Visit (INDEPENDENT_AMBULATORY_CARE_PROVIDER_SITE_OTHER): Payer: Medicare Other | Admitting: Family Medicine

## 2021-07-29 ENCOUNTER — Encounter: Payer: Self-pay | Admitting: Family Medicine

## 2021-07-29 VITALS — BP 92/73 | HR 95 | Temp 97.6°F | Ht 72.0 in | Wt 162.8 lb

## 2021-07-29 DIAGNOSIS — F411 Generalized anxiety disorder: Secondary | ICD-10-CM | POA: Diagnosis not present

## 2021-07-29 DIAGNOSIS — S22000A Wedge compression fracture of unspecified thoracic vertebra, initial encounter for closed fracture: Secondary | ICD-10-CM | POA: Insufficient documentation

## 2021-07-29 DIAGNOSIS — N200 Calculus of kidney: Secondary | ICD-10-CM

## 2021-07-29 DIAGNOSIS — K5900 Constipation, unspecified: Secondary | ICD-10-CM | POA: Diagnosis not present

## 2021-07-29 DIAGNOSIS — Z20822 Contact with and (suspected) exposure to covid-19: Secondary | ICD-10-CM | POA: Diagnosis not present

## 2021-07-29 DIAGNOSIS — G8929 Other chronic pain: Secondary | ICD-10-CM | POA: Insufficient documentation

## 2021-07-29 NOTE — Assessment & Plan Note (Signed)
Managed by psychiatry.  He is on Ativan.  He is aware to not take this with narcotics.  Ativan could also be contributing to his constipation. ?

## 2021-07-29 NOTE — Progress Notes (Signed)
? ?  David Armstrong is a 71 y.o. male who presents today for an office visit. ? ?Assessment/Plan:  ?New/Acute Problems: ?Constipation ?Improving with miralax daily.  This pain medications are likely making this worse.  He will continue to use miralax as needed.  ? ?Chronic Problems Addressed Today: ?Compression fracture of body of thoracic vertebra (HCC) ?Continue management per neurosurgery.  Has upcoming kyphoplasty which should help hopefully alleviate most of his pain.  He has not had much improvement with calcitonin or tramadol.  No red flag signs or symptoms.  Recently had CT scan of abdomen without any significant changes. ? ?Nephrolithiasis ?Noted on recent CT abdomen.  Continue management per urology. ? ?Anxiety state ?Managed by psychiatry.  He is on Ativan.  He is aware to not take this with narcotics.  Ativan could also be contributing to his constipation. ? ? ?  ?Subjective:  ?HPI: ? ?Patient here for ED follow-up.  Went to the ED on 07/23/2021 with abdominal pain.  CT scan showed small to moderate hiatal hernia with bilateral nonobstructing nephrolithiasis.  Also noted to have multiple lower thoracic and lumbar compression fractures. ? ?Over the last week or so, he has had continued back and abdominal pain. He has been taking miralax with some improvement in constipation.  Still has quite a bit of back pain.  Tramadol helps modestly.  He has some leftover hydrocodone that he may try as well.  He has not yet tried this.  He will be following up with his back surgeon in a couple of weeks for a kyphoplasty. ? ?   ?  ?Objective:  ?Physical Exam: ?BP 92/73 (BP Location: Left Arm)   Pulse 95   Temp 97.6 ?F (36.4 ?C) (Temporal)   Ht 6' (1.829 m)   Wt 162 lb 12.8 oz (73.8 kg)   SpO2 98%   BMI 22.08 kg/m?   ?Gen: No acute distress, resting comfortably ?CV: Regular rate and rhythm with no murmurs appreciated ?Pulm: Normal work of breathing, clear to auscultation bilaterally with no crackles, wheezes, or  rhonchi ?Neuro: Grossly normal, moves all extremities ?Psych: Normal affect and thought content ? ?Time Spent: ?50 minutes of total time was spent on the date of the encounter performing the following actions: chart review prior to seeing the patient including recent ED visit, obtaining history, performing a medically necessary exam, counseling on the treatment plan, placing orders, and documenting in our EHR.  ? ? ?   ? ?[name]. [name], MD ?07/29/2021 11:17 AM  ?

## 2021-07-29 NOTE — Patient Instructions (Signed)
It was very nice to see you today! ? ?No medication changes today.  I hope your pain will improve after your back surgery. ? ?Please come back to see me after your surgery.  Please let us know if you need any further assistance.  ? ?Take care, ?Dr [name] ? ?PLEASE NOTE: ? ?If you had any lab tests please let us know if you have not heard back within a few days. You may see your results on mychart before we have a chance to review them but we will give you a call once they are reviewed by [place]. If we ordered any referrals today, please let us know if you have not heard from their office within the next week.  ? ?Please try these tips to maintain a healthy lifestyle: ? ?Eat at least 3 REAL meals and 1-2 snacks per day.  Aim for no more than 5 hours between eating.  If you eat breakfast, please do so within one hour of getting up.  ? ?Each meal should contain half fruits/vegetables, one quarter protein, and one quarter carbs (no bigger than a computer mouse) ? ?Cut down on sweet beverages. This includes juice, soda, and sweet tea.  ? ?Drink at least 1 glass of water with each meal and aim for at least 8 glasses per day ? ?Exercise at least 150 minutes every week.   ?

## 2021-07-29 NOTE — Assessment & Plan Note (Signed)
Noted on recent CT abdomen.  Continue management per urology. ?

## 2021-07-29 NOTE — Assessment & Plan Note (Signed)
Continue management per neurosurgery.  Has upcoming kyphoplasty which should help hopefully alleviate most of his pain.  He has not had much improvement with calcitonin or tramadol.  No red flag signs or symptoms.  Recently had CT scan of abdomen without any significant changes. ?

## 2021-07-30 DIAGNOSIS — M9903 Segmental and somatic dysfunction of lumbar region: Secondary | ICD-10-CM | POA: Diagnosis not present

## 2021-07-30 DIAGNOSIS — M9905 Segmental and somatic dysfunction of pelvic region: Secondary | ICD-10-CM | POA: Diagnosis not present

## 2021-07-30 DIAGNOSIS — M5137 Other intervertebral disc degeneration, lumbosacral region: Secondary | ICD-10-CM | POA: Diagnosis not present

## 2021-07-30 DIAGNOSIS — M9904 Segmental and somatic dysfunction of sacral region: Secondary | ICD-10-CM | POA: Diagnosis not present

## 2021-07-31 DIAGNOSIS — L400 Psoriasis vulgaris: Secondary | ICD-10-CM | POA: Diagnosis not present

## 2021-07-31 DIAGNOSIS — L570 Actinic keratosis: Secondary | ICD-10-CM | POA: Diagnosis not present

## 2021-07-31 DIAGNOSIS — L578 Other skin changes due to chronic exposure to nonionizing radiation: Secondary | ICD-10-CM | POA: Diagnosis not present

## 2021-07-31 DIAGNOSIS — D225 Melanocytic nevi of trunk: Secondary | ICD-10-CM | POA: Diagnosis not present

## 2021-08-01 DIAGNOSIS — M542 Cervicalgia: Secondary | ICD-10-CM | POA: Diagnosis not present

## 2021-08-01 DIAGNOSIS — R519 Headache, unspecified: Secondary | ICD-10-CM | POA: Diagnosis not present

## 2021-08-01 DIAGNOSIS — M545 Low back pain, unspecified: Secondary | ICD-10-CM | POA: Diagnosis not present

## 2021-08-01 DIAGNOSIS — G603 Idiopathic progressive neuropathy: Secondary | ICD-10-CM | POA: Diagnosis not present

## 2021-08-01 DIAGNOSIS — R252 Cramp and spasm: Secondary | ICD-10-CM | POA: Diagnosis not present

## 2021-08-01 DIAGNOSIS — Z79899 Other long term (current) drug therapy: Secondary | ICD-10-CM | POA: Diagnosis not present

## 2021-08-05 DIAGNOSIS — M9905 Segmental and somatic dysfunction of pelvic region: Secondary | ICD-10-CM | POA: Diagnosis not present

## 2021-08-05 DIAGNOSIS — M5137 Other intervertebral disc degeneration, lumbosacral region: Secondary | ICD-10-CM | POA: Diagnosis not present

## 2021-08-05 DIAGNOSIS — M9903 Segmental and somatic dysfunction of lumbar region: Secondary | ICD-10-CM | POA: Diagnosis not present

## 2021-08-05 DIAGNOSIS — M9904 Segmental and somatic dysfunction of sacral region: Secondary | ICD-10-CM | POA: Diagnosis not present

## 2021-08-07 DIAGNOSIS — M9903 Segmental and somatic dysfunction of lumbar region: Secondary | ICD-10-CM | POA: Diagnosis not present

## 2021-08-07 DIAGNOSIS — M9904 Segmental and somatic dysfunction of sacral region: Secondary | ICD-10-CM | POA: Diagnosis not present

## 2021-08-07 DIAGNOSIS — M5137 Other intervertebral disc degeneration, lumbosacral region: Secondary | ICD-10-CM | POA: Diagnosis not present

## 2021-08-07 DIAGNOSIS — M9905 Segmental and somatic dysfunction of pelvic region: Secondary | ICD-10-CM | POA: Diagnosis not present

## 2021-08-12 DIAGNOSIS — M8088XA Other osteoporosis with current pathological fracture, vertebra(e), initial encounter for fracture: Secondary | ICD-10-CM | POA: Diagnosis not present

## 2021-08-13 ENCOUNTER — Other Ambulatory Visit: Payer: Self-pay | Admitting: Pain Medicine

## 2021-08-13 DIAGNOSIS — S22000S Wedge compression fracture of unspecified thoracic vertebra, sequela: Secondary | ICD-10-CM

## 2021-08-15 ENCOUNTER — Other Ambulatory Visit: Payer: Self-pay | Admitting: Pain Medicine

## 2021-08-15 DIAGNOSIS — S22000S Wedge compression fracture of unspecified thoracic vertebra, sequela: Secondary | ICD-10-CM

## 2021-08-16 ENCOUNTER — Other Ambulatory Visit: Payer: Medicare Other

## 2021-08-19 DIAGNOSIS — Z20822 Contact with and (suspected) exposure to covid-19: Secondary | ICD-10-CM | POA: Diagnosis not present

## 2021-08-24 ENCOUNTER — Ambulatory Visit
Admission: RE | Admit: 2021-08-24 | Discharge: 2021-08-24 | Disposition: A | Discharge status: Home / Self Care | Payer: Medicare Other | Source: Ambulatory Visit | Attending: Pain Medicine | Admitting: Pain Medicine

## 2021-08-24 DIAGNOSIS — S22000S Wedge compression fracture of unspecified thoracic vertebra, sequela: Secondary | ICD-10-CM

## 2021-08-24 DIAGNOSIS — M40204 Unspecified kyphosis, thoracic region: Secondary | ICD-10-CM | POA: Diagnosis not present

## 2021-08-24 DIAGNOSIS — S32030A Wedge compression fracture of third lumbar vertebra, initial encounter for closed fracture: Secondary | ICD-10-CM | POA: Diagnosis not present

## 2021-08-27 DIAGNOSIS — M439 Deforming dorsopathy, unspecified: Secondary | ICD-10-CM | POA: Diagnosis not present

## 2021-08-27 DIAGNOSIS — M549 Dorsalgia, unspecified: Secondary | ICD-10-CM | POA: Diagnosis not present

## 2021-08-29 ENCOUNTER — Ambulatory Visit: Payer: Medicare Other | Admitting: Family Medicine

## 2021-08-30 ENCOUNTER — Ambulatory Visit (INDEPENDENT_AMBULATORY_CARE_PROVIDER_SITE_OTHER): Payer: Medicare Other | Admitting: Family Medicine

## 2021-08-30 ENCOUNTER — Encounter: Payer: Self-pay | Admitting: Family Medicine

## 2021-08-30 VITALS — BP 140/86 | HR 80 | Temp 97.8°F | Ht 72.0 in | Wt 162.0 lb

## 2021-08-30 DIAGNOSIS — F411 Generalized anxiety disorder: Secondary | ICD-10-CM | POA: Diagnosis not present

## 2021-08-30 DIAGNOSIS — S22000A Wedge compression fracture of unspecified thoracic vertebra, initial encounter for closed fracture: Secondary | ICD-10-CM

## 2021-08-30 DIAGNOSIS — E785 Hyperlipidemia, unspecified: Secondary | ICD-10-CM | POA: Diagnosis not present

## 2021-08-30 DIAGNOSIS — M810 Age-related osteoporosis without current pathological fracture: Secondary | ICD-10-CM

## 2021-08-30 NOTE — Assessment & Plan Note (Signed)
Overall stable. He is on ativan per psychiatry and is aware to not take this with narcotics.

## 2021-08-30 NOTE — Assessment & Plan Note (Signed)
He has been started on alendronate '70mg'$  weekly. He had bone density scan done by his spine specialist. We can repeat in 2 years.

## 2021-08-30 NOTE — Assessment & Plan Note (Signed)
No red flags but he has had new compression fractures as evident on his recent MRI. He will be following up with his spine specialist soon and may be considering kyphoplasty again if pain is not improving. HE has tramadol to use as needed.

## 2021-08-30 NOTE — Progress Notes (Signed)
   David Armstrong is a 71 y.o. male who presents today for an office visit.  Assessment/Plan:  Chronic Problems Addressed Today: Osteoporosis He has been started on alendronate '70mg'$  weekly. He had bone density scan done by his spine specialist. We can repeat in 2 years.   Compression fracture of body of thoracic vertebra (HCC) No red flags but he has had new compression fractures as evident on his recent MRI. He will be following up with his spine specialist soon and may be considering kyphoplasty again if Armstrong is not improving. HE has tramadol to use as needed.   Anxiety state Overall stable. He is on ativan per psychiatry and is aware to not take this with narcotics.     Subjective:  HPI:  Patient here for follow up.  Since our last visit he underwent kyphoplasty about 3 weeks ago. This helped somewhat however he had an MRI earlier this week which showed three new compression fractures. Armstrong is about the same as last time he was here.  Armstrong is still significant but manageable. He has been using tramadol which works modestly well. No new weakness or numbness.        Objective:  Physical Exam: BP 140/86 (BP Location: Left Arm)   Pulse 80   Temp 97.8 F (36.6 C) (Temporal)   Ht 6' (1.829 m)   Wt 162 lb (73.5 kg)   SpO2 98%   BMI 21.97 kg/m   Gen: No acute distress, resting comfortably Neuro: Grossly normal, moves all extremities Psych: Normal affect and thought content  Time Spent: 45 minutes of total time was spent on the date of the encounter performing the following actions: chart review prior to seeing the patient including recent imaging and visits with specialists, obtaining history, performing a medically necessary exam, counseling on the treatment plan, placing orders, and documenting in our EHR.        David Armstrong. David Pain, MD 08/30/2021 11:10 AM

## 2021-08-30 NOTE — Patient Instructions (Signed)
It was very nice to see you today!  No changes today. Please follow up with your back specialist soon.   Take care, Dr [name]  PLEASE NOTE:  If you had any lab tests please let us know if you have not heard back within a few days. You may see your results on mychart before we have a chance to review them but we will give you a call once they are reviewed by [place]. If we ordered any referrals today, please let us know if you have not heard from their office within the next week.   Please try these tips to maintain a healthy lifestyle:  Eat at least 3 REAL meals and 1-2 snacks per day.  Aim for no more than 5 hours between eating.  If you eat breakfast, please do so within one hour of getting up.   Each meal should contain half fruits/vegetables, one quarter protein, and one quarter carbs (no bigger than a computer mouse)  Cut down on sweet beverages. This includes juice, soda, and sweet tea.   Drink at least 1 glass of water with each meal and aim for at least 8 glasses per day  Exercise at least 150 minutes every week.

## 2021-09-02 DIAGNOSIS — H01019 Ulcerative blepharitis unspecified eye, unspecified eyelid: Secondary | ICD-10-CM | POA: Diagnosis not present

## 2021-09-02 DIAGNOSIS — H40023 Open angle with borderline findings, high risk, bilateral: Secondary | ICD-10-CM | POA: Diagnosis not present

## 2021-09-05 DIAGNOSIS — R1084 Generalized abdominal pain: Secondary | ICD-10-CM | POA: Diagnosis not present

## 2021-09-05 DIAGNOSIS — N401 Enlarged prostate with lower urinary tract symptoms: Secondary | ICD-10-CM | POA: Diagnosis not present

## 2021-09-05 DIAGNOSIS — R311 Benign essential microscopic hematuria: Secondary | ICD-10-CM | POA: Diagnosis not present

## 2021-09-05 DIAGNOSIS — R300 Dysuria: Secondary | ICD-10-CM | POA: Diagnosis not present

## 2021-09-05 DIAGNOSIS — N139 Obstructive and reflux uropathy, unspecified: Secondary | ICD-10-CM | POA: Diagnosis not present

## 2021-09-16 ENCOUNTER — Ambulatory Visit (INDEPENDENT_AMBULATORY_CARE_PROVIDER_SITE_OTHER): Payer: Medicare Other | Admitting: Family

## 2021-09-16 ENCOUNTER — Encounter: Payer: Self-pay | Admitting: Family

## 2021-09-16 VITALS — BP 126/85 | HR 100 | Temp 98.6°F | Ht 72.0 in | Wt 161.0 lb

## 2021-09-16 DIAGNOSIS — J309 Allergic rhinitis, unspecified: Secondary | ICD-10-CM | POA: Diagnosis not present

## 2021-09-16 DIAGNOSIS — R059 Cough, unspecified: Secondary | ICD-10-CM

## 2021-09-16 LAB — POC COVID19 BINAXNOW: SARS Coronavirus 2 Ag: NEGATIVE

## 2021-09-16 MED ORDER — FLUTICASONE PROPIONATE 50 MCG/ACT NA SUSP: 2.0000 | Freq: Every day | NASAL | 6 refills | Status: DC

## 2021-09-16 NOTE — Progress Notes (Signed)
Subjective:     Patient ID: David Armstrong, male    DOB: 1951/01/10, 71 y.o.   MRN: [id number]  Chief Complaint  Patient presents with   Nasal Congestion    Pt c/o cough for a couple of weeks. Also nasal congestion(green discharge) and headache for about a week. Has not tried anything.    HPI: Upper Respiratory Infection: Symptoms include nasal congestion, non productive cough, purulent nasal discharge, and sinus pressure.  Onset of symptoms was 2 weeks ago, unchanged since that time. He is drinking moderate amounts of fluids. Evaluation to date: none.  Treatment to date: none.   Assessment & Plan:   Problem List Items Addressed This Visit       Respiratory   Allergic rhinitis - Primary pt has not been on his Flonase in a long time, using a different nasal spray for spine pain. Advised pt to resume bid dosing x 1 week, then reduce to qd dosing, drink at least 2 liters of water qd.    Relevant Medications   fluticasone (FLONASE) 50 MCG/ACT nasal spray   Other Visit Diagnoses     Cough, unspecified type    - pt feels his sx are similar to when he had covid back in [place], but did not think to test at home. Rapid test today is negative.     Relevant Orders   POC COVID-19      Outpatient Medications Prior to Visit  Medication Sig Dispense Refill   alendronate (FOSAMAX) 70 MG tablet Take 70 mg by mouth once a week.     betamethasone dipropionate 0.05 % cream Apply topically 2 (two) times daily. 45 g 2   calcitonin, salmon, (MIACALCIN) 200 UNIT/ACT nasal spray Place 1 spray into alternate nostrils daily. 3.7 mL 2   desonide (DESOWEN) 0.05 % lotion APPLY TOPICALLY TO THE AFFECTED AREA EVERY DAY AS NEEDED 118 mL 2   Elastic Bandages & Supports (MEDICAL COMPRESSION STOCKINGS) MISC Use daily. 2 each 0   furosemide (LASIX) 20 MG tablet Take 1 tablet (20 mg total) by mouth daily as needed. 30 tablet 3   hyoscyamine (LEVSIN SL) 0.125 MG SL tablet Take 2 tablets (0.25 mg total) by  mouth every 6 (six) hours as needed. DISSOLVE 1 TABLET(0.125 MG) UNDER THE TONGUE EVERY 6 HOURS AS NEEDED 120 tablet 3   linaclotide (LINZESS) 145 MCG CAPS capsule Take 1 capsule (145 mcg total) by mouth daily before breakfast. 30 capsule 0   LORazepam (ATIVAN) 1 MG tablet Take 0.5-1 tablets (0.5-1 mg total) by mouth every 8 (eight) hours as needed for anxiety. 90 tablet 0   omeprazole (PRILOSEC) 20 MG capsule TAKE 1 CAPSULE BY MOUTH EVERY DAY AS NEEDED     potassium chloride (KLOR-CON M) 10 MEQ tablet Take 1 tablet (10 mEq total) by mouth daily. 30 tablet 0   rivaroxaban (XARELTO) 20 MG TABS tablet Take 1 tablet (20 mg total) by mouth daily with supper. Start 20 mg daily tablets after completion of 3 weeks of 15 mg BID dosing. 90 tablet 1   tamsulosin (FLOMAX) 0.4 MG CAPS capsule Take 0.4 mg by mouth daily.     tiZANidine (ZANAFLEX) 4 MG tablet Take 1 tablet (4 mg total) by mouth every 6 (six) hours as needed for muscle spasms. 30 tablet 0   fluticasone (FLONASE) 50 MCG/ACT nasal spray Place 2 sprays into both nostrils daily. 16 g 6   No facility-administered medications prior to visit.    Past Medical  History:  Diagnosis Date   Anxiety    Arthritis    "lower back; hips;' right knee"    Bipolar disorder ([place])    Chronic constipation    Depression    Diverticulosis of colon    Edema of both lower extremities    ANKLES   Fatty liver    GERD (gastroesophageal reflux disease)    Hiatal hernia    History of anal fissures    History of deep vein thrombosis (DVT) of lower extremity 02/11/2016   right lower extremity distal and proximal extensive acute   History of Helicobacter pylori infection 2012; 2009   History of kidney stones    History of pneumonia 01/2015   CAP   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    Hyperlipidemia    IBS (irritable bowel syndrome)    Left ureteral stone    Nocturia    Peripheral neuropathy    per pt due to back DDD   Personal  history of colonic polyps    HYPERPLASTIC POLYP   Pulmonary nodule, right    incidental finding CT chest angio 02-11-2016 stable   TMJ (temporomandibular joint syndrome)    Varicosities of leg     Past Surgical History:  Procedure Laterality Date   BELPHAROPTOSIS REPAIR Bilateral 01/2015   CATARACT EXTRACTION W/ INTRAOCULAR LENS  IMPLANT, BILATERAL  04/2016   COLONOSCOPY WITH ESOPHAGOGASTRODUODENOSCOPY (EGD)  last one 03-02-2013   CYSTOSCOPY/RETROGRADE/URETEROSCOPY/STONE EXTRACTION WITH BASKET  2010   CYSTOSCOPY/URETEROSCOPY/HOLMIUM LASER/STENT PLACEMENT Left 03/16/2017   Procedure: CYSTOSCOPY/RETROGRADE/URETEROSCOPY/HOLMIUM LASER/STENT PLACEMENT, STONE BASKET EXTRACTION AND STENT PLACEMENT;  Surgeon: [name], MD;  Location: [place];  Service: Urology;  Laterality: Left;   EXCISIONAL HEMORRHOIDECTOMY  2015 approx.   EXTRACORPOREAL SHOCK WAVE LITHOTRIPSY  1987; 1992   FOOT NEUROMA SURGERY Bilateral left 09/2015; right 04/ 2017   morton' neuroma   FOOT NEUROMA SURGERY Right 2004   morton's neuroma and removal heel spur   HAMMER TOE SURGERY Left 2013   5th toe   INCISION AND DRAINAGE FOOT Left 1992   "ball of foot; stepped on piece of hard wire & it went thru my shoe"   KNEE ARTHROSCOPY Bilateral left x2 1997;  right 2004 & 2005   [place]   PATELLA-FEMORAL ARTHROPLASTY Right 07-06-2003   dr Eddie Dibbles [facility]   PLANTAR FASCIA RELEASE Bilateral 1987 & 1993   RHINOPLASTY  2000   SHOULDER ARTHROSCOPY Left 2012;  2013   SHOULDER ARTHROSCOPY WITH DISTAL CLAVICLE RESECTION Right 07-31-2005   dr Percell Miller  [facility]   w/ Debridement labral and adhesions, acromioplasty, bursectomy, and CA ligament release   TRANSTHORACIC ECHOCARDIOGRAM  02/12/2016   ef 53-64%, grade 1 diastolic dysfunction/  trivial TR    Allergies  Allergen Reactions   Effexor Xr [Venlafaxine Hcl Er] Other (See Comments)    Insomnia   Acyclovir And Related    Zocor [Simvastatin] Other (See  Comments)    Muscle ache/ pain   Dexilant [Dexlansoprazole] Other (See Comments)    Muscle aches   Ivp Dye [Iodinated Contrast Media] Rash   Soma [Carisoprodol] Other (See Comments)    Sores on arm   Sulfa Antibiotics Rash       Objective:    Physical Exam Vitals and nursing note reviewed.  Constitutional:      General: He is not in acute distress.    Appearance: Normal appearance.  HENT:     Head: Normocephalic.  Right Ear: Ear canal and external ear normal.     Left Ear: Ear canal and external ear normal.     Nose:     Right Sinus: Frontal sinus tenderness present.     Left Sinus: Frontal sinus tenderness present.     Mouth/Throat:     Mouth: Mucous membranes are moist.     Pharynx: Oropharyngeal exudate present. No pharyngeal swelling, posterior oropharyngeal erythema or uvula swelling.  Cardiovascular:     Rate and Rhythm: Normal rate and regular rhythm.  Pulmonary:     Effort: Pulmonary effort is normal.     Breath sounds: Normal breath sounds.  Musculoskeletal:        General: Normal range of motion.     Cervical back: Normal range of motion.  Lymphadenopathy:     Cervical: Cervical adenopathy present.     Right cervical: Superficial cervical adenopathy (mild) present.     Left cervical: Superficial cervical adenopathy (mild) present.  Skin:    General: Skin is warm and dry.  Neurological:     Mental Status: He is alert and oriented to person, place, and time.  Psychiatric:        Mood and Affect: Mood normal.    BP 126/85 (BP Location: Left Arm, Patient Position: Sitting, Cuff Size: Large)   Pulse 100   Temp 98.6 F (37 C) (Temporal)   Ht 6' (1.829 m)   Wt 161 lb (73 kg)   SpO2 94%   BMI 21.84 kg/m  Wt Readings from Last 3 Encounters:  09/16/21 161 lb (73 kg)  08/30/21 162 lb (73.5 kg)  07/29/21 162 lb 12.8 oz (73.8 kg)      Meds ordered this encounter  Medications   fluticasone (FLONASE) 50 MCG/ACT nasal spray    Sig: Place 2 sprays into  both nostrils daily. USE twice a day for 1 week, then reduce to using daily.    Dispense:  16 g    Refill:  6    Order Specific Question:   Supervising Provider    Answer:   ANDY, CAMILLE L [[id number]]    Jeanie Sewer, NP

## 2021-09-16 NOTE — Patient Instructions (Signed)
It was very nice to see you today!   I have refilled your Flonase nasal spray, start this today! This will help your sinus symptoms.  Drink at least 2 liters of water daily.  Call back if no better by end of week.     PLEASE NOTE:  If you had any lab tests please let us know if you have not heard back within a few days. You may see your results on MyChart before we have a chance to review them but we will give you a call once they are reviewed by [place]. If we ordered any referrals today, please let us know if you have not heard from their office within the next week.

## 2021-09-24 DIAGNOSIS — M545 Low back pain, unspecified: Secondary | ICD-10-CM | POA: Diagnosis not present

## 2021-09-24 DIAGNOSIS — S22080D Wedge compression fracture of T11-T12 vertebra, subsequent encounter for fracture with routine healing: Secondary | ICD-10-CM | POA: Diagnosis not present

## 2021-10-01 DIAGNOSIS — H26491 Other secondary cataract, right eye: Secondary | ICD-10-CM | POA: Diagnosis not present

## 2021-10-01 DIAGNOSIS — H01019 Ulcerative blepharitis unspecified eye, unspecified eyelid: Secondary | ICD-10-CM | POA: Diagnosis not present

## 2021-10-01 DIAGNOSIS — H35371 Puckering of macula, right eye: Secondary | ICD-10-CM | POA: Diagnosis not present

## 2021-10-01 DIAGNOSIS — H40023 Open angle with borderline findings, high risk, bilateral: Secondary | ICD-10-CM | POA: Diagnosis not present

## 2021-10-03 DIAGNOSIS — M5481 Occipital neuralgia: Secondary | ICD-10-CM | POA: Diagnosis not present

## 2021-10-03 DIAGNOSIS — Z79899 Other long term (current) drug therapy: Secondary | ICD-10-CM | POA: Diagnosis not present

## 2021-10-03 DIAGNOSIS — M542 Cervicalgia: Secondary | ICD-10-CM | POA: Diagnosis not present

## 2021-10-03 DIAGNOSIS — M545 Low back pain, unspecified: Secondary | ICD-10-CM | POA: Diagnosis not present

## 2021-10-03 DIAGNOSIS — G603 Idiopathic progressive neuropathy: Secondary | ICD-10-CM | POA: Diagnosis not present

## 2021-10-04 ENCOUNTER — Other Ambulatory Visit: Payer: Self-pay | Admitting: Sports Medicine

## 2021-10-04 DIAGNOSIS — S22089G Unspecified fracture of T11-T12 vertebra, subsequent encounter for fracture with delayed healing: Secondary | ICD-10-CM

## 2021-10-07 DIAGNOSIS — D225 Melanocytic nevi of trunk: Secondary | ICD-10-CM | POA: Diagnosis not present

## 2021-10-07 DIAGNOSIS — L821 Other seborrheic keratosis: Secondary | ICD-10-CM | POA: Diagnosis not present

## 2021-10-07 DIAGNOSIS — D485 Neoplasm of uncertain behavior of skin: Secondary | ICD-10-CM | POA: Diagnosis not present

## 2021-10-07 DIAGNOSIS — L989 Disorder of the skin and subcutaneous tissue, unspecified: Secondary | ICD-10-CM | POA: Diagnosis not present

## 2021-10-07 DIAGNOSIS — L298 Other pruritus: Secondary | ICD-10-CM | POA: Diagnosis not present

## 2021-10-07 DIAGNOSIS — L814 Other melanin hyperpigmentation: Secondary | ICD-10-CM | POA: Diagnosis not present

## 2021-10-07 DIAGNOSIS — L988 Other specified disorders of the skin and subcutaneous tissue: Secondary | ICD-10-CM | POA: Diagnosis not present

## 2021-10-07 DIAGNOSIS — L309 Dermatitis, unspecified: Secondary | ICD-10-CM | POA: Diagnosis not present

## 2021-10-10 DIAGNOSIS — L309 Dermatitis, unspecified: Secondary | ICD-10-CM | POA: Diagnosis not present

## 2021-10-16 ENCOUNTER — Ambulatory Visit (INDEPENDENT_AMBULATORY_CARE_PROVIDER_SITE_OTHER): Payer: Medicare Other | Admitting: Family

## 2021-10-16 ENCOUNTER — Encounter: Payer: Self-pay | Admitting: Family

## 2021-10-16 VITALS — BP 108/80 | HR 86 | Temp 98.0°F | Ht 72.0 in | Wt 156.4 lb

## 2021-10-16 DIAGNOSIS — R21 Rash and other nonspecific skin eruption: Secondary | ICD-10-CM

## 2021-10-16 NOTE — Progress Notes (Signed)
Subjective:     Patient ID: David Armstrong, male    DOB: 02-19-1951, 71 y.o.   MRN: [id number]  Chief Complaint  Patient presents with   Rash    Pt c/o rash between thighs , back and buttocks. Seen dermatologist and Noticed rash, was given prednisone shot and creams. Pt states it has help some. Rash is a little itchy and redness.     HPI: Dermatitis: Patient complains of a rash. Symptoms began 2 weeks ago. Patient describes the rash as pigmented, excoriated. Characteristics of rash and associated history: Is rash pruritic?  no, Is rash painful?  no. Patient's previous dermatologic history includes  nothing . Medications currently using: topical steroid: betamethasone, and oral steroid (finished) . Environmental exposures or allergies: none Reports seen by [facility] about a week - 10 days ago and given prednisone and ointment but not helping, denies any itching, reports they took a biopsy sample for testing.  Assessment & Plan:   Problem List Items Addressed This Visit   None Visit Diagnoses     Skin rash    -  Primary very diffuse, dime to nickel size pink oval/round spots with scaling on outer edge, not itching, seen by [facility] and given prednisone and cream to apply, but has not resolved. He reports they took a biopsy, but did not realize he still had stitches left in his left upper, lateral thigh, one spot with 2 stitches with erythema.   Advised to call and schedule appt to have removed and also get results of biopsy, but if unable to see quickly this week, he should return and we can remove the stitches.      Outpatient Medications Prior to Visit  Medication Sig Dispense Refill   alendronate (FOSAMAX) 70 MG tablet Take 70 mg by mouth once a week.     betamethasone dipropionate 0.05 % cream Apply topically 2 (two) times daily. 45 g 2   calcitonin, salmon, (MIACALCIN) 200 UNIT/ACT nasal spray Place 1 spray into alternate nostrils daily. 3.7 mL 2   desonide (DESOWEN) 0.05 % lotion APPLY  TOPICALLY TO THE AFFECTED AREA EVERY DAY AS NEEDED 118 mL 2   Elastic Bandages & Supports (MEDICAL COMPRESSION STOCKINGS) MISC Use daily. 2 each 0   fluticasone (FLONASE) 50 MCG/ACT nasal spray Place 2 sprays into both nostrils daily. USE twice a day for 1 week, then reduce to using daily. 16 g 6   furosemide (LASIX) 20 MG tablet Take 1 tablet (20 mg total) by mouth daily as needed. 30 tablet 3   hyoscyamine (LEVSIN SL) 0.125 MG SL tablet Take 2 tablets (0.25 mg total) by mouth every 6 (six) hours as needed. DISSOLVE 1 TABLET(0.125 MG) UNDER THE TONGUE EVERY 6 HOURS AS NEEDED 120 tablet 3   linaclotide (LINZESS) 145 MCG CAPS capsule Take 1 capsule (145 mcg total) by mouth daily before breakfast. 30 capsule 0   LORazepam (ATIVAN) 1 MG tablet Take 0.5-1 tablets (0.5-1 mg total) by mouth every 8 (eight) hours as needed for anxiety. 90 tablet 0   nystatin-triamcinolone ointment (MYCOLOG) AAPLY TO THE AFFECTED AREA ON BUTTOCKS DAILY     omeprazole (PRILOSEC) 20 MG capsule TAKE 1 CAPSULE BY MOUTH EVERY DAY AS NEEDED     potassium chloride (KLOR-CON M) 10 MEQ tablet Take 1 tablet (10 mEq total) by mouth daily. 30 tablet 0   rivaroxaban (XARELTO) 20 MG TABS tablet Take 1 tablet (20 mg total) by mouth daily with supper. Start 20 mg daily  tablets after completion of 3 weeks of 15 mg BID dosing. 90 tablet 1   tamsulosin (FLOMAX) 0.4 MG CAPS capsule Take 0.4 mg by mouth daily.     tiZANidine (ZANAFLEX) 4 MG tablet Take 1 tablet (4 mg total) by mouth every 6 (six) hours as needed for muscle spasms. 30 tablet 0   No facility-administered medications prior to visit.    Past Medical History:  Diagnosis Date   Anxiety    Arthritis    "lower back; hips;' right knee"    Bipolar disorder ([place])    Chronic constipation    Depression    Diverticulosis of colon    Edema of both lower extremities    ANKLES   Fatty liver    GERD (gastroesophageal reflux disease)    Hiatal hernia    History of anal fissures     History of deep vein thrombosis (DVT) of lower extremity 02/11/2016   right lower extremity distal and proximal extensive acute   History of Helicobacter pylori infection 2012; 2009   History of kidney stones    History of pneumonia 01/2015   CAP   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    Hyperlipidemia    IBS (irritable bowel syndrome)    Left ureteral stone    Nocturia    Peripheral neuropathy    per pt due to back DDD   Personal history of colonic polyps    HYPERPLASTIC POLYP   Pulmonary nodule, right    incidental finding CT chest angio 02-11-2016 stable   TMJ (temporomandibular joint syndrome)    Varicosities of leg     Past Surgical History:  Procedure Laterality Date   BELPHAROPTOSIS REPAIR Bilateral 01/2015   CATARACT EXTRACTION W/ INTRAOCULAR LENS  IMPLANT, BILATERAL  04/2016   COLONOSCOPY WITH ESOPHAGOGASTRODUODENOSCOPY (EGD)  last one 03-02-2013   CYSTOSCOPY/RETROGRADE/URETEROSCOPY/STONE EXTRACTION WITH BASKET  2010   CYSTOSCOPY/URETEROSCOPY/HOLMIUM LASER/STENT PLACEMENT Left 03/16/2017   Procedure: CYSTOSCOPY/RETROGRADE/URETEROSCOPY/HOLMIUM LASER/STENT PLACEMENT, STONE BASKET EXTRACTION AND STENT PLACEMENT;  Surgeon: [name], MD;  Location: [place];  Service: Urology;  Laterality: Left;   EXCISIONAL HEMORRHOIDECTOMY  2015 approx.   EXTRACORPOREAL SHOCK WAVE LITHOTRIPSY  1987; 1992   FOOT NEUROMA SURGERY Bilateral left 09/2015; right 04/ 2017   morton' neuroma   FOOT NEUROMA SURGERY Right 2004   morton's neuroma and removal heel spur   HAMMER TOE SURGERY Left 2013   5th toe   INCISION AND DRAINAGE FOOT Left 1992   "ball of foot; stepped on piece of hard wire & it went thru my shoe"   KNEE ARTHROSCOPY Bilateral left x2 1997;  right 2004 & 2005   [place]   PATELLA-FEMORAL ARTHROPLASTY Right 07-06-2003   dr Eddie Dibbles [facility]   PLANTAR FASCIA RELEASE Bilateral 1987 & 1993   RHINOPLASTY  2000    SHOULDER ARTHROSCOPY Left 2012;  2013   SHOULDER ARTHROSCOPY WITH DISTAL CLAVICLE RESECTION Right 07-31-2005   dr Percell Miller  [facility]   w/ Debridement labral and adhesions, acromioplasty, bursectomy, and CA ligament release   TRANSTHORACIC ECHOCARDIOGRAM  02/12/2016   ef 93-57%, grade 1 diastolic dysfunction/  trivial TR    Allergies  Allergen Reactions   Effexor Xr [Venlafaxine Hcl Er] Other (See Comments)    Insomnia   Acyclovir And Related    Zocor [Simvastatin] Other (See Comments)    Muscle ache/ pain   Dexilant [Dexlansoprazole] Other (See Comments)    Muscle aches   Ivp  Dye [Iodinated Contrast Media] Rash   Soma [Carisoprodol] Other (See Comments)    Sores on arm   Sulfa Antibiotics Rash       Objective:    Physical Exam Vitals and nursing note reviewed.  Constitutional:      General: He is not in acute distress.    Appearance: Normal appearance.  HENT:     Head: Normocephalic.  Cardiovascular:     Rate and Rhythm: Normal rate and regular rhythm.  Pulmonary:     Effort: Pulmonary effort is normal.     Breath sounds: Normal breath sounds.  Musculoskeletal:        General: Normal range of motion.     Cervical back: Normal range of motion.  Skin:    General: Skin is warm and dry.     Findings: Rash (low back, buttucks, back and front of bilateral thighs, appears as fungal dime-sized patches, diffuse with scaling on edges) present.          Comments: 2 locations close together 1 w/2 stitches and 1 just above with 1 stitch - see above pic  Neurological:     Mental Status: He is alert and oriented to person, place, and time.  Psychiatric:        Mood and Affect: Mood normal.     BP 108/80 (BP Location: Left Arm, Patient Position: Sitting, Cuff Size: Large)   Pulse 86   Temp 98 F (36.7 C) (Temporal)   Ht 6' (1.829 m)   Wt 156 lb 6 oz (70.9 kg)   SpO2 98%   BMI 21.21 kg/m  Wt Readings from Last 3 Encounters:  10/16/21 156 lb 6 oz (70.9 kg)  09/16/21 161 lb (73  kg)  08/30/21 162 lb (73.5 kg)        No orders of the defined types were placed in this encounter.   Jeanie Sewer, NP

## 2021-10-16 NOTE — Patient Instructions (Signed)
It was very nice to see you today!   Call your dermatology office tomorrow to ask about getting biopsy stitches taken out from your left thigh, also to ask what the results of the biopsy testing are.     PLEASE NOTE:  If you had any lab tests please let us know if you have not heard back within a few days. You may see your results on MyChart before we have a chance to review them but we will give you a call once they are reviewed by [place]. If we ordered any referrals today, please let us know if you have not heard from their office within the next week.

## 2021-10-28 DIAGNOSIS — R311 Benign essential microscopic hematuria: Secondary | ICD-10-CM | POA: Diagnosis not present

## 2021-10-28 DIAGNOSIS — N200 Calculus of kidney: Secondary | ICD-10-CM | POA: Diagnosis not present

## 2021-10-30 ENCOUNTER — Ambulatory Visit (INDEPENDENT_AMBULATORY_CARE_PROVIDER_SITE_OTHER): Payer: Medicare Other | Admitting: Family Medicine

## 2021-10-30 ENCOUNTER — Encounter: Payer: Self-pay | Admitting: Family Medicine

## 2021-10-30 VITALS — BP 109/82 | HR 87 | Temp 98.3°F | Ht 72.0 in | Wt 159.4 lb

## 2021-10-30 DIAGNOSIS — F320 Major depressive disorder, single episode, mild: Secondary | ICD-10-CM

## 2021-10-30 DIAGNOSIS — R600 Localized edema: Secondary | ICD-10-CM | POA: Diagnosis not present

## 2021-10-30 DIAGNOSIS — N200 Calculus of kidney: Secondary | ICD-10-CM | POA: Diagnosis not present

## 2021-10-30 DIAGNOSIS — R109 Unspecified abdominal pain: Secondary | ICD-10-CM | POA: Diagnosis not present

## 2021-10-30 DIAGNOSIS — F411 Generalized anxiety disorder: Secondary | ICD-10-CM

## 2021-10-30 LAB — CBC
HCT: 42.7 % (ref 39.0–52.0)
Hemoglobin: 14.3 g/dL (ref 13.0–17.0)
MCHC: 33.5 g/dL (ref 30.0–36.0)
MCV: 90.9 fl (ref 78.0–100.0)
Platelets: 145 10*3/uL — ABNORMAL LOW (ref 150.0–400.0)
RBC: 4.7 Mil/uL (ref 4.22–5.81)
RDW: 14.9 % (ref 11.5–15.5)
WBC: 5.9 10*3/uL (ref 4.0–10.5)

## 2021-10-30 LAB — URINALYSIS, ROUTINE W REFLEX MICROSCOPIC
Bilirubin Urine: NEGATIVE
Ketones, ur: NEGATIVE
Leukocytes,Ua: NEGATIVE
Nitrite: NEGATIVE
Specific Gravity, Urine: 1.025 (ref 1.000–1.030)
Total Protein, Urine: NEGATIVE
Urine Glucose: NEGATIVE
Urobilinogen, UA: 0.2 (ref 0.0–1.0)
pH: 6 (ref 5.0–8.0)

## 2021-10-30 LAB — COMPREHENSIVE METABOLIC PANEL
ALT: 17 U/L (ref 0–53)
AST: 19 U/L (ref 0–37)
Albumin: 3.8 g/dL (ref 3.5–5.2)
Alkaline Phosphatase: 67 U/L (ref 39–117)
BUN: 22 mg/dL (ref 6–23)
CO2: 26 mEq/L (ref 19–32)
Calcium: 8.8 mg/dL (ref 8.4–10.5)
Chloride: 107 mEq/L (ref 96–112)
Creatinine, Ser: 0.86 mg/dL (ref 0.40–1.50)
GFR: 87.66 mL/min (ref >60.00)
Glucose, Bld: 92 mg/dL (ref 70–99)
Potassium: 4.9 mEq/L (ref 3.5–5.1)
Sodium: 143 mEq/L (ref 135–145)
Total Bilirubin: 0.7 mg/dL (ref 0.2–1.2)
Total Protein: 6 g/dL (ref 6.0–8.3)

## 2021-10-30 LAB — TSH: TSH: 3.23 u[IU]/mL (ref 0.35–5.50)

## 2021-10-30 LAB — LIPASE: Lipase: 19 U/L (ref 11.0–59.0)

## 2021-10-30 MED ORDER — OMEPRAZOLE 20 MG PO CPDR: 20.0000 mg | DELAYED_RELEASE_CAPSULE | Freq: Every day | ORAL | 3 refills | Status: DC

## 2021-10-30 NOTE — Assessment & Plan Note (Signed)
Worsened recently due to grief from his niece passing away. He currently follows with psychiatry.  He deferred making any medications today.

## 2021-10-30 NOTE — Patient Instructions (Signed)
It was very nice to see you today!  We will check blood work and a urine sample today.  Your kidney stones could be causing your symptoms.  We may need to get a CT scan if your symptoms are not improving in the next 1-2 weeks.   Take care, Dr [name]  PLEASE NOTE:  If you had any lab tests please let us know if you have not heard back within a few days. You may see your results on mychart before we have a chance to review them but we will give you a call once they are reviewed by [place]. If we ordered any referrals today, please let us know if you have not heard from their office within the next week.   Please try these tips to maintain a healthy lifestyle:  Eat at least 3 REAL meals and 1-2 snacks per day.  Aim for no more than 5 hours between eating.  If you eat breakfast, please do so within one hour of getting up.   Each meal should contain half fruits/vegetables, one quarter protein, and one quarter carbs (no bigger than a computer mouse)  Cut down on sweet beverages. This includes juice, soda, and sweet tea.   Drink at least 1 glass of water with each meal and aim for at least 8 glasses per day  Exercise at least 150 minutes every week.

## 2021-10-30 NOTE — Progress Notes (Signed)
   David Armstrong is a 71 y.o. male who presents today for an office visit.  Assessment/Plan:  New/Acute Problems: Left Flank Pain No red flags. Possibly due to kidney stones. He has a known history of nephrolithiasis and follows with urology for this. We will check UA and labs today. It is also possible this could be due to his compression fractures as well. He does have a known history of diverticulitis but symptoms not currently consistent with this. Given reassuring exam today do not think we need to get imaging today.  Chronic Problems Addressed Today: Depression, major, single episode, mild ([place]) Worsened recently due to grief from his niece passing away. He currently follows with psychiatry.  He deferred making any medications today.   Anxiety state Follows with psychiatry. He is on ativan as needed.   Nephrolithiasis Could be the source of his left flank pain. Will check UA today. If symptoms persist will need repeat CT scan.   Leg edema No red flags. He has not been taking lasix routinely. He is down a couple of pounds of the last month - no signs of volume overload. We again discussed conservative management including salt avoidance, leg elevation, compression.   Reiterated that he should take lasix as needed. He was agreeable with this. Can refer to vascular surgery if this continues to be an issue.      Subjective:  HPI:  See A/p for status of chronic conditions.  We last saw him about 2 months ago.  At that time he was dealing with pain related to his vertebral compression fractures. He underwent kyphoplasty several weeks ago with modest improvement in symptoms however symptoms have still has some symptoms.  His niece suddenly passed away a week ago due to an aortic aneurysm. This has been a significant source of stress for him.   HE is also having pain in the left side of his abdomen. He think that it may be coming from his back. No obvious new injuries or  precipitating events.  No constipation. No diarrhea. HE is having normal bowel movements. Stool has been normal. No nausea or vomiting.        Objective:  Physical Exam: BP 109/82   Pulse 87   Temp 98.3 F (36.8 C) (Temporal)   Ht 6' (1.829 m)   Wt 159 lb 6.4 oz (72.3 kg)   SpO2 96%   BMI 21.62 kg/m   Wt Readings from Last 3 Encounters:  10/30/21 159 lb 6.4 oz (72.3 kg)  10/16/21 156 lb 6 oz (70.9 kg)  09/16/21 161 lb (73 kg)  Gen: No acute distress, resting comfortably CV: Regular rate and rhythm with no murmurs appreciated Pulm: Normal work of breathing, clear to auscultation bilaterally with no crackles, wheezes, or rhonchi GI: S, NT, ND, BS present. Neuro: Grossly normal, moves all extremities Psych: Normal affect and thought content  Time Spent: 45 minutes of total time was spent on the date of the encounter performing the following actions: chart review prior to seeing the patient, obtaining history, performing a medically necessary exam, counseling on the treatment plan, placing orders, and documenting in our EHR.        [name]. [name], MD 10/30/2021 12:02 PM

## 2021-10-30 NOTE — Assessment & Plan Note (Signed)
Follows with psychiatry. He is on ativan as needed.

## 2021-10-30 NOTE — Assessment & Plan Note (Signed)
Could be the source of his left flank pain. Will check UA today. If symptoms persist will need repeat CT scan.

## 2021-10-30 NOTE — Assessment & Plan Note (Addendum)
No red flags. He has not been taking lasix routinely. He is down a couple of pounds of the last month - no signs of volume overload. We again discussed conservative management including salt avoidance, leg elevation, compression.   Reiterated that he should take lasix as needed. He was agreeable with this. Can refer to vascular surgery if this continues to be an issue.

## 2021-11-01 NOTE — Progress Notes (Signed)
Please inform patient of the following:  He has a large amount of blood in his urine.  Everything else is normal.  This is probably due to his kidney stones.  Do not need to do anything else at this point but he should follow-up with [place] or his urologist if still not improving.

## 2021-11-06 ENCOUNTER — Encounter: Payer: Self-pay | Admitting: Family Medicine

## 2021-11-07 ENCOUNTER — Telehealth: Payer: Self-pay | Admitting: Family Medicine

## 2021-11-07 NOTE — Telephone Encounter (Signed)
Patient states he has blood in his urine and pain. Patient transferred to Triage.

## 2021-11-07 NOTE — Telephone Encounter (Signed)
Pt scheduled to see Dr. [name] tomorrow.

## 2021-11-07 NOTE — Telephone Encounter (Signed)
Patient Name: David Armstrong Gender: Male DOB: 1950/07/08 Age: 71 Y 60 M 20 D Return Phone Number: [phone] (Primary) Address: City/ State/ Zip: [place] [address] Client [facility] Client Site [place] [facility] [place] [facility] Provider [name]- MD Contact Type Call Who Is Calling Patient / Member / Family / Caregiver Call Type Triage / Clinical Relationship To Patient Self Return Phone Number [phone] (Primary) Chief Complaint Urine, Blood In Reason for Call Symptomatic / Request for [place] states he had a urine test and is wondering about the results, he has kidney stones and blood in his urine. Translation No Nurse Assessment Nurse: Raenette Rover, RN, Zella Ball Date/Time (Eastern Time): 11/07/2021 9:37:41 AM Confirm and document reason for call. If symptomatic, describe symptoms. ---Caller states he had a urine test done on July 18 th and is wondering about the results, he has kidney stones and blood in his urine. Has currently has 5 stones in the left kidney and a small one in the right kidney. Dr wanted Liprotripsy but patient is concerned about it. Intermittent pain at times. My chart said to follow up with your doctor. Does the patient have any new or worsening symptoms? ---Yes Will a triage be completed? ---Yes Related visit to physician within the last 2 weeks? ---Yes Does the PT have any chronic conditions? (i.e. diabetes, asthma, this includes High risk factors for pregnancy, etc.) ---Yes List chronic conditions. ---kidney stones, DVT, Compression fractures. Is this a behavioral health or substance abuse call? ---No Guidelines Guideline Title Affirmed Question Affirmed Notes Nurse Date/Time (Eastern Time) Kidney Stone Follow-up Call [1] MODERATE pain (e.g., interferes with normal activities) AND [2] Lahoma Crocker 11/07/2021  9:43:10 AM  Guidelines Guideline Title Affirmed Question Affirmed Notes Nurse Date/Time Eilene Ghazi Time) pain comes and goes AND [3] present > 24 hours Disp. Time Eilene Ghazi Time) Disposition Final User 11/07/2021 9:51:51 AM See PCP within 24 Hours Yes Raenette Rover, RN, Zella Ball Final Disposition 11/07/2021 9:51:51 AM See PCP within 24 Hours Yes Raenette Rover, RN, Herbert Deaner Disagree/Comply Comply Caller Understands Yes PreDisposition Search internet for information Care Advice Given Per Guideline SEE PCP WITHIN 24 HOURS: * Drink 10 to 12 cups (2,500 to 3,000 ml) of liquids a day. * This will help flush out any pieces of stone. This will water-down your urine and make it less painful to pass. CALL BACK IF: * Severe pain * Bloody urine or large blood clots * Fever over 100.4 F (38.0 C) * You become worse CARE ADVICE given per Kidney Stone Follow-up (Adult) guideline. Comments User: Wilson Singer, RN Date/Time Eilene Ghazi Time): 11/07/2021 9:50:14 AM temp 98.3 at this time. Referrals REFERRED TO PCP OFFICE

## 2021-11-08 ENCOUNTER — Ambulatory Visit: Payer: Medicare Other | Admitting: Family Medicine

## 2021-11-12 DIAGNOSIS — L411 Pityriasis lichenoides chronica: Secondary | ICD-10-CM | POA: Diagnosis not present

## 2021-11-19 ENCOUNTER — Ambulatory Visit (INDEPENDENT_AMBULATORY_CARE_PROVIDER_SITE_OTHER): Payer: Medicare Other | Admitting: Family Medicine

## 2021-11-19 ENCOUNTER — Encounter: Payer: Self-pay | Admitting: Family Medicine

## 2021-11-19 VITALS — BP 122/88 | HR 96 | Temp 98.5°F | Ht 72.0 in | Wt 157.8 lb

## 2021-11-19 DIAGNOSIS — N200 Calculus of kidney: Secondary | ICD-10-CM | POA: Diagnosis not present

## 2021-11-19 DIAGNOSIS — R519 Headache, unspecified: Secondary | ICD-10-CM | POA: Diagnosis not present

## 2021-11-19 DIAGNOSIS — H6120 Impacted cerumen, unspecified ear: Secondary | ICD-10-CM | POA: Diagnosis not present

## 2021-11-19 DIAGNOSIS — J309 Allergic rhinitis, unspecified: Secondary | ICD-10-CM

## 2021-11-19 DIAGNOSIS — S22000A Wedge compression fracture of unspecified thoracic vertebra, initial encounter for closed fracture: Secondary | ICD-10-CM | POA: Diagnosis not present

## 2021-11-19 NOTE — Assessment & Plan Note (Signed)
Still some pain but improving.

## 2021-11-19 NOTE — Progress Notes (Signed)
   David Armstrong is a 71 y.o. male who presents today for an office visit.  Assessment/Plan:  New/Acute Problems: Ear Pain / Cerumen Impaction Removed via irrigation and instrumentation today.  See below procedure note.  Had some EAC irritation after removal - likely the source of his symptoms. Tolerated well.  He can use hydrogen peroxide in the future to prevent buildup.  Chronic Problems Addressed Today: Nephrolithiasis Still has some persistent flank pain.  Found to have large hemoglobin on most recent UA.  Advised him to follow up with urology soon.   Compression fracture of body of thoracic vertebra (HCC) Still some pain but improving.      Subjective:  HPI:  See a/p for status of chronic conditions.  We saw him about 2 weeks ago for left flank pain. UA showed large hemoglobin. He does have a known history of nephrolithasis. HE was recommended to follow up with urology however has not done so yet. His pain is about the same today as at his last visit.    He has also been having a headache and ear pain for the last 5 days or so. He has noticed that when he stands up he has a lot of pressure in his neck. No arm pain. No weakness or numbness. Pain is worse with swallowing. Tried taking tramadol with modest improvement. No vision changes. No hearing loss. Occasionally gets some ringing in his ear. He has noticed some increased sneezing.        Objective:  Physical Exam: BP 122/88   Pulse 96   Temp 98.5 F (36.9 C) (Temporal)   Ht 6' (1.829 m)   Wt 157 lb 12.8 oz (71.6 kg)   SpO2 96%   BMI 21.40 kg/m   Gen: No acute distress, resting comfortably HEEN: Bilateral EAC obscured by cerumen. CV: Regular rate and rhythm with no murmurs appreciated Pulm: Normal work of breathing, clear to auscultation bilaterally with no crackles, wheezes, or rhonchi Neuro: Grossly normal, moves all extremities Psych: Normal affect and thought content   Procedure note Verbal consent  obtained.  He has bilateral EAC are visualized and cerumen impaction was noted bilaterally.  This was removed via instrumentation with alligator forceps and curettage.  The area was then irrigated by CMA.  He tolerated well.   Time Spent: 45 minutes of total time was spent on the date of the encounter performing the following actions: chart review prior to seeing the patient, obtaining history, performing a medically necessary exam, counseling on the treatment plan, placing orders, and documenting in our EHR.  This time does not include the time spent performing the above procedure.       [name]. [name], MD 11/19/2021 4:14 PM

## 2021-11-19 NOTE — Assessment & Plan Note (Addendum)
Still has some persistent flank pain.  Found to have large hemoglobin on most recent UA.  Advised him to follow up with urology soon.

## 2021-11-19 NOTE — Patient Instructions (Signed)
It was very nice to see you today!  You have earwax buildup.  We remove this today.  Please use hydrogen peroxide once or twice weekly prevent buildup in the future.  Take care, Dr [name]  PLEASE NOTE:  If you had any lab tests please let us know if you have not heard back within a few days. You may see your results on mychart before we have a chance to review them but we will give you a call once they are reviewed by [place]. If we ordered any referrals today, please let us know if you have not heard from their office within the next week.   Please try these tips to maintain a healthy lifestyle:  Eat at least 3 REAL meals and 1-2 snacks per day.  Aim for no more than 5 hours between eating.  If you eat breakfast, please do so within one hour of getting up.   Each meal should contain half fruits/vegetables, one quarter protein, and one quarter carbs (no bigger than a computer mouse)  Cut down on sweet beverages. This includes juice, soda, and sweet tea.   Drink at least 1 glass of water with each meal and aim for at least 8 glasses per day  Exercise at least 150 minutes every week.

## 2021-12-02 ENCOUNTER — Ambulatory Visit (INDEPENDENT_AMBULATORY_CARE_PROVIDER_SITE_OTHER): Payer: Medicare Other

## 2021-12-02 VITALS — Ht 73.0 in | Wt 157.0 lb

## 2021-12-02 DIAGNOSIS — Z0000 Encounter for general adult medical examination without abnormal findings: Secondary | ICD-10-CM | POA: Diagnosis not present

## 2021-12-02 NOTE — Patient Instructions (Addendum)
  David Armstrong , Thank you for taking time to come for your Medicare Wellness Visit. I appreciate your ongoing commitment to your health goals. Please review the following plan we discussed and let me know if I can assist you in the future.   These are the goals we discussed:  Goals       Patient Stated     Avoid kidney stones. Maintain current health status. (pt-stated)      Other     Patient Stated      I will drink more water        This is a list of the screening recommended for you and due dates:  Health Maintenance  Topic Date Due   COVID-19 Vaccine (6 - Moderna risk series) 12/18/2021*   Flu Shot  07/13/2022*   Colon Cancer Screening  03/03/2023   Tetanus Vaccine  06/04/2026   Pneumonia Vaccine  Completed   Hepatitis C Screening: USPSTF Recommendation to screen - Ages 18-79 yo.  Completed   Zoster (Shingles) Vaccine  Completed   HPV Vaccine  Aged Out  *Topic was postponed. The date shown is not the original due date.

## 2021-12-02 NOTE — Progress Notes (Signed)
Subjective:   David Armstrong is a 71 y.o. male who presents for Medicare Annual/Subsequent preventive examination.  Review of Systems    No ROS.  Medicare Wellness Virtual Visit.  Visual/audio telehealth visit, UTA vital signs.   See social history for additional risk factors.   Cardiac Risk Factors include: advanced age (>71mn, >>88women)     Objective:    Today's Vitals   12/02/21 1319  Weight: 157 lb (71.2 kg)  Height: '6\' 1"'$  (1.854 m)   Body mass index is 20.71 kg/m.     12/02/2021    1:19 PM 07/23/2021   11:45 AM 04/30/2021    4:27 PM 11/19/2020    1:07 PM 11/14/2019    1:20 PM 09/11/2019    3:59 PM 09/10/2019    2:34 AM  Advanced Directives  Does Patient Have a Medical Advance Directive? No No No Yes No No No  Does patient want to make changes to medical advance directive?    Yes (MAU/Ambulatory/Procedural Areas - Information given)     Would patient like information on creating a medical advance directive? No - Patient declined No - Patient declined No - Patient declined  Yes (MAU/Ambulatory/Procedural Areas - Information given)  No - Patient declined    Current Medications (verified) Outpatient Encounter Medications as of 12/02/2021  Medication Sig   alendronate (FOSAMAX) 70 MG tablet Take 70 mg by mouth once a week.   betamethasone dipropionate 0.05 % cream Apply topically 2 (two) times daily.   calcitonin, salmon, (MIACALCIN) 200 UNIT/ACT nasal spray Place 1 spray into alternate nostrils daily.   desonide (DESOWEN) 0.05 % lotion APPLY TOPICALLY TO THE AFFECTED AREA EVERY DAY AS NEEDED   Elastic Bandages & Supports (MEDICAL COMPRESSION STOCKINGS) MISC Use daily.   fluticasone (FLONASE) 50 MCG/ACT nasal spray Place 2 sprays into both nostrils daily. USE twice a day for 1 week, then reduce to using daily.   furosemide (LASIX) 20 MG tablet Take 1 tablet (20 mg total) by mouth daily as needed.   hyoscyamine (LEVSIN SL) 0.125 MG SL tablet Take 2 tablets (0.25 mg total)  by mouth every 6 (six) hours as needed. DISSOLVE 1 TABLET(0.125 MG) UNDER THE TONGUE EVERY 6 HOURS AS NEEDED   linaclotide (LINZESS) 145 MCG CAPS capsule Take 1 capsule (145 mcg total) by mouth daily before breakfast.   LORazepam (ATIVAN) 1 MG tablet Take 0.5-1 tablets (0.5-1 mg total) by mouth every 8 (eight) hours as needed for anxiety.   nystatin-triamcinolone ointment (MYCOLOG)    omeprazole (PRILOSEC) 20 MG capsule Take 1 capsule (20 mg total) by mouth daily.   potassium chloride (KLOR-CON M) 10 MEQ tablet Take 1 tablet (10 mEq total) by mouth daily.   rivaroxaban (XARELTO) 20 MG TABS tablet Take 1 tablet (20 mg total) by mouth daily with supper. Start 20 mg daily tablets after completion of 3 weeks of 15 mg BID dosing.   tamsulosin (FLOMAX) 0.4 MG CAPS capsule Take 0.4 mg by mouth daily.   tiZANidine (ZANAFLEX) 4 MG tablet Take 1 tablet (4 mg total) by mouth every 6 (six) hours as needed for muscle spasms.   No facility-administered encounter medications on file as of 12/02/2021.    Allergies (verified) Effexor xr [venlafaxine hcl er], Acyclovir and related, Zocor [simvastatin], Dexilant [dexlansoprazole], Ivp dye [iodinated contrast media], Soma [carisoprodol], and Sulfa antibiotics   History: Past Medical History:  Diagnosis Date   Anxiety    Arthritis    "lower back; hips;' right knee"  Bipolar disorder ([place])    Chronic constipation    Depression    Diverticulosis of colon    Edema of both lower extremities    ANKLES   Fatty liver    GERD (gastroesophageal reflux disease)    Hiatal hernia    History of anal fissures    History of deep vein thrombosis (DVT) of lower extremity 02/11/2016   right lower extremity distal and proximal extensive acute   History of Helicobacter pylori infection 2012; 2009   History of kidney stones    History of pneumonia 01/2015   CAP   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    Hyperlipidemia    IBS (irritable  bowel syndrome)    Left ureteral stone    Nocturia    Peripheral neuropathy    per pt due to back DDD   Personal history of colonic polyps    HYPERPLASTIC POLYP   Pulmonary nodule, right    incidental finding CT chest angio 02-11-2016 stable   TMJ (temporomandibular joint syndrome)    Varicosities of leg    Past Surgical History:  Procedure Laterality Date   BELPHAROPTOSIS REPAIR Bilateral 01/2015   CATARACT EXTRACTION W/ INTRAOCULAR LENS  IMPLANT, BILATERAL  04/2016   COLONOSCOPY WITH ESOPHAGOGASTRODUODENOSCOPY (EGD)  last one 03-02-2013   CYSTOSCOPY/RETROGRADE/URETEROSCOPY/STONE EXTRACTION WITH BASKET  2010   CYSTOSCOPY/URETEROSCOPY/HOLMIUM LASER/STENT PLACEMENT Left 03/16/2017   Procedure: CYSTOSCOPY/RETROGRADE/URETEROSCOPY/HOLMIUM LASER/STENT PLACEMENT, STONE BASKET EXTRACTION AND STENT PLACEMENT;  Surgeon: [name], MD;  Location: [facility];  Service: Urology;  Laterality: Left;   EXCISIONAL HEMORRHOIDECTOMY  2015 approx.   EXTRACORPOREAL SHOCK WAVE LITHOTRIPSY  1987; 1992   FOOT NEUROMA SURGERY Bilateral left 09/2015; right 04/ 2017   morton' neuroma   FOOT NEUROMA SURGERY Right 2004   morton's neuroma and removal heel spur   HAMMER TOE SURGERY Left 2013   5th toe   INCISION AND DRAINAGE FOOT Left 1992   "ball of foot; stepped on piece of hard wire & it went thru my shoe"   KNEE ARTHROSCOPY Bilateral left x2 1997;  right 2004 & 2005   NASAL FRACTURE SURGERY  1997   PATELLA-FEMORAL ARTHROPLASTY Right 07-06-2003   dr Eddie Dibbles [facility]   PLANTAR FASCIA RELEASE Bilateral 1987 & 1993   RHINOPLASTY  2000   SHOULDER ARTHROSCOPY Left 2012;  2013   SHOULDER ARTHROSCOPY WITH DISTAL CLAVICLE RESECTION Right 07-31-2005   dr Percell Miller  [facility]   w/ Debridement labral and adhesions, acromioplasty, bursectomy, and CA ligament release   TRANSTHORACIC ECHOCARDIOGRAM  02/12/2016   ef 64-68%, grade 1 diastolic dysfunction/  trivial TR   Family History  Problem Relation Age of  Onset   Heart disease Mother    Stroke Mother    Cirrhosis Father    Heart disease Father    Skin cancer Father    Prostate cancer Maternal Grandfather    Parkinson's disease Paternal Grandfather    Heart disease Brother    Colon cancer Neg Hx    Stomach cancer Neg Hx    Social History   Socioeconomic History   Marital status: Divorced    Spouse name: Not on file   Number of children: 0   Years of education: Not on file   Highest education level: Not on file  Occupational History   Occupation: retired    Fish farm manager: [place] [employer]    Comment: disabled now  Tobacco Use   Smoking status: Never   Smokeless tobacco: Never  Vaping Use   Vaping Use: Never used  Substance and Sexual Activity   Alcohol use: No    Alcohol/week: 0.0 standard drinks of alcohol   Drug use: No   Sexual activity: Never  Other Topics Concern   Not on file  Social History Narrative   Goes by "[name]"   Divorced   No children   Retired from the post office   Has internet girlfriend on facebook from [place] Strain: [place]  (12/02/2021)   Overall Financial Resource Strain ([place])    Difficulty of Paying Living Expenses: Not hard at all  Food Insecurity: No [place] (12/02/2021)   Hunger Vital Sign    Worried About Running Out of Food in the Last Year: Never true    [place] in the Last Year: Never true  Transportation Needs: No Transportation Needs (12/02/2021)   PRAPARE - Hydrologist (Medical): No    Lack of Transportation (Non-Medical): No  Physical Activity: Insufficiently Active (11/19/2020)   Exercise Vital Sign    Days of Exercise per Week: 4 days    Minutes of Exercise per Session: 30 min  Stress: No Stress Concern Present (12/02/2021)   [place]    Feeling of Stress : Only a little  Social Connections: Socially Isolated (12/02/2021)    Social Connection and Isolation Panel [NHANES]    Frequency of Communication with Friends and Family: Three times a week    Frequency of Social Gatherings with Friends and Family: Never    Attends Religious Services: Never    Marine scientist or Organizations: No    Attends Music therapist: Never    Marital Status: Divorced    Tobacco Counseling Counseling given: Not Answered  Clinical Intake: Pre-visit preparation completed: Yes        Diabetes: No  How often do you need to have someone help you when you read instructions, pamphlets, or other written materials from your doctor or pharmacy?: 1 - Never  Interpreter Needed?: No    Activities of Daily Living    12/02/2021    1:23 PM  In your present state of health, do you have any difficulty performing the following activities:  Hearing? 0  Vision? 0  Difficulty concentrating or making decisions? 0  Walking or climbing stairs? 0  Dressing or bathing? 0  Doing errands, shopping? 0  Preparing Food and eating ? N  Using the Toilet? N  In the past six months, have you accidently leaked urine? N  Do you have problems with loss of bowel control? N  Managing your Medications? N  Managing your Finances? N  Housekeeping or managing your Housekeeping? N   Patient Care Team: Vivi Barrack, MD as PCP - General (Family Medicine) Michael Boston, MD as Consulting Physician (General Surgery) Sable Feil, MD as Consulting Physician (Gastroenterology) Willia Craze, NP as Nurse Practitioner (Gastroenterology) [name], MD (Inactive) as Consulting Physician (Urology) [name], MD as Consulting Physician (Psychiatry) Alyson Ingles Candee Furbish, MD as Consulting Physician (Urology)  Indicate any recent Medical Services you may have received from other than [facility] providers in the past year (date may be approximate).     Assessment:   This is a routine wellness examination for [place].  Virtual  Visit via Telephone Note  I connected with  David Armstrong on 12/02/21 at  1:15 PM EDT by telephone and verified that I am speaking with the correct person using two identifiers.  Location: Patient: home Provider: office Persons participating in the virtual visit: patient/Nurse Health Advisor   I discussed the limitations of performing an evaluation and management service by telehealth. We continued and completed visit with audio only. Some vital signs may be absent or patient reported.   Hearing/Vision screen Hearing Screening - Comments:: Pt denies any hearing issues  Vision Screening - Comments:: Pt follows up with Dr Barbie Haggis for annual eye exams   Dietary issues and exercise activities discussed: Current Exercise Habits: Home exercise routine, Type of exercise: walking, Intensity: Mild Regular diet Fair water intake   Goals Addressed               This Visit's Progress     Patient Stated     Avoid kidney stones. Maintain current health status. (pt-stated)   On track     Other     Patient Stated        I will drink more water      COMPLETED: Patient Stated        None at this time       Depression Screen    12/02/2021    1:45 PM 10/30/2021   12:13 PM 05/14/2021   11:43 AM 11/19/2020    1:06 PM 10/10/2020   11:18 AM 06/07/2020    2:06 PM 11/14/2019    1:12 PM  PHQ 2/9 Scores  PHQ - 2 Score '2 2 5 1 2 4 6  '$ PHQ- 9 Score '4 5 9  8 17 15    '$ Fall Risk    12/02/2021    1:21 PM 10/30/2021   11:16 AM 05/14/2021   11:44 AM 11/19/2020    1:09 PM 10/10/2020   11:22 AM  Fall Risk   Falls in the past year? 0 0 0 0 0  Number falls in past yr:  0 0 0 0  Injury with Fall?  0 0 0 0  Risk for fall due to :  No Fall Risks  Impaired vision No Fall Risks  Follow up Falls evaluation completed;Falls prevention discussed   Falls prevention discussed     FALL RISK PREVENTION PERTAINING TO THE HOME: Home free of loose throw rugs in walkways, pet beds, electrical cords, etc? No   Adequate lighting in your home to reduce risk of falls? No   ASSISTIVE DEVICES UTILIZED TO PREVENT FALLS: Life alert? No  Use of a cane, walker or w/c? No  Grab bars in the bathroom? No  Shower chair or bench in shower? No  Elevated toilet seat or a handicapped toilet? No   TIMED UP AND GO: Was the test performed? No .   Cognitive Function:        12/02/2021    1:46 PM 11/19/2020    1:12 PM 11/14/2019    1:26 PM  6CIT Screen  What Year? 0 points 0 points   What month? 0 points 0 points   What time? 0 points 0 points 0 points  Count back from 20 0 points 0 points 0 points  Months in reverse 0 points 0 points 0 points  Repeat phrase 0 points 0 points 4 points  Total Score 0 points 0 points     Immunizations Immunization History  Administered Date(s) Administered   Fluad Quad(high Dose 65+) 12/25/2020   Influenza Split 12/22/2012, 01/13/2016   Influenza, High Dose Seasonal PF  01/19/2018, 12/02/2018   Influenza,inj,Quad PF,6+ Mos 01/07/2014   Influenza-Unspecified 02/12/2015, 01/13/2016, 12/30/2019, 12/13/2020   Moderna Covid-19 Vaccine Bivalent Booster 2yr & up 01/09/2021   Moderna Sars-Covid-2 Vaccination 06/15/2019, 07/13/2019, 02/14/2020, 07/31/2020   Pneumococcal Conjugate-13 08/05/2016   Pneumococcal Polysaccharide-23 01/19/2018   Tdap 11/29/2012, 06/04/2016   Zoster Recombinat (Shingrix) 07/13/2016, 09/12/2016   Zoster, Live 11/29/2012   Screening Tests Health Maintenance  Topic Date Due   COVID-19 Vaccine (6 - Moderna risk series) 12/18/2021 (Originally 03/06/2021)   INFLUENZA VACCINE  07/13/2022 (Originally 11/12/2021)   COLONOSCOPY (Pts 45-473yrInsurance coverage will need to be confirmed)  03/03/2023   TETANUS/TDAP  06/04/2026   Pneumonia Vaccine 6571Years old  Completed   Hepatitis C Screening  Completed   Zoster Vaccines- Shingrix  Completed   HPV VACCINES  Aged Out   Health Maintenance There are no preventive care reminders to display for this  patient.  Lung Cancer Screening: (Low Dose CT Chest recommended if Age 71-80ears, 30 pack-year currently smoking OR have quit w/in 15years.) does not qualify.   Vision Screening: Recommended annual ophthalmology exams for early detection of glaucoma and other disorders of the eye.  Dental Screening: Recommended annual dental exams for proper oral hygiene  Community Resource Referral / Chronic Care Management: CRR required this visit?  No   CCM required this visit?  No      Plan:     I have personally reviewed and noted the following in the patient's chart:   Medical and social history Use of alcohol, tobacco or illicit drugs  Current medications and supplements including opioid prescriptions. Patient is not currently taking opioid prescriptions. Functional ability and status Nutritional status Physical activity Advanced directives List of other physicians Hospitalizations, surgeries, and ER visits in previous 12 months Vitals Screenings to include cognitive, depression, and falls Referrals and appointments  In addition, I have reviewed and discussed with patient certain preventive protocols, quality metrics, and best practice recommendations. A written personalized care plan for preventive services as well as general preventive health recommendations were provided to patient.     OBVarney BilesLPN   8/0/01/2724

## 2021-12-03 DIAGNOSIS — H18413 Arcus senilis, bilateral: Secondary | ICD-10-CM | POA: Diagnosis not present

## 2021-12-03 DIAGNOSIS — H26492 Other secondary cataract, left eye: Secondary | ICD-10-CM | POA: Diagnosis not present

## 2021-12-03 DIAGNOSIS — Z961 Presence of intraocular lens: Secondary | ICD-10-CM | POA: Diagnosis not present

## 2021-12-03 DIAGNOSIS — H40013 Open angle with borderline findings, low risk, bilateral: Secondary | ICD-10-CM | POA: Diagnosis not present

## 2021-12-05 DIAGNOSIS — Z79899 Other long term (current) drug therapy: Secondary | ICD-10-CM | POA: Diagnosis not present

## 2021-12-05 DIAGNOSIS — M5481 Occipital neuralgia: Secondary | ICD-10-CM | POA: Diagnosis not present

## 2021-12-05 DIAGNOSIS — M542 Cervicalgia: Secondary | ICD-10-CM | POA: Diagnosis not present

## 2021-12-05 DIAGNOSIS — R519 Headache, unspecified: Secondary | ICD-10-CM | POA: Diagnosis not present

## 2021-12-05 DIAGNOSIS — M545 Low back pain, unspecified: Secondary | ICD-10-CM | POA: Diagnosis not present

## 2021-12-05 DIAGNOSIS — G603 Idiopathic progressive neuropathy: Secondary | ICD-10-CM | POA: Diagnosis not present

## 2021-12-10 ENCOUNTER — Other Ambulatory Visit: Payer: Self-pay | Admitting: Specialist

## 2021-12-10 DIAGNOSIS — H26492 Other secondary cataract, left eye: Secondary | ICD-10-CM | POA: Diagnosis not present

## 2021-12-10 DIAGNOSIS — H26491 Other secondary cataract, right eye: Secondary | ICD-10-CM | POA: Diagnosis not present

## 2021-12-10 DIAGNOSIS — M5417 Radiculopathy, lumbosacral region: Secondary | ICD-10-CM

## 2021-12-10 DIAGNOSIS — S22080A Wedge compression fracture of T11-T12 vertebra, initial encounter for closed fracture: Secondary | ICD-10-CM

## 2021-12-10 DIAGNOSIS — H40013 Open angle with borderline findings, low risk, bilateral: Secondary | ICD-10-CM | POA: Diagnosis not present

## 2021-12-12 ENCOUNTER — Telehealth: Payer: Self-pay | Admitting: Family Medicine

## 2021-12-12 ENCOUNTER — Other Ambulatory Visit: Payer: Self-pay

## 2021-12-12 ENCOUNTER — Encounter (HOSPITAL_BASED_OUTPATIENT_CLINIC_OR_DEPARTMENT_OTHER): Payer: Self-pay | Admitting: Emergency Medicine

## 2021-12-12 ENCOUNTER — Emergency Department (HOSPITAL_BASED_OUTPATIENT_CLINIC_OR_DEPARTMENT_OTHER): Payer: Medicare Other

## 2021-12-12 ENCOUNTER — Emergency Department (HOSPITAL_BASED_OUTPATIENT_CLINIC_OR_DEPARTMENT_OTHER)
Admission: EM | Admit: 2021-12-12 | Discharge: 2021-12-12 | Disposition: A | Discharge status: Home / Self Care | Payer: Medicare Other | Attending: Emergency Medicine | Admitting: Emergency Medicine

## 2021-12-12 DIAGNOSIS — R0602 Shortness of breath: Secondary | ICD-10-CM | POA: Diagnosis not present

## 2021-12-12 DIAGNOSIS — Z7901 Long term (current) use of anticoagulants: Secondary | ICD-10-CM | POA: Insufficient documentation

## 2021-12-12 DIAGNOSIS — J9811 Atelectasis: Secondary | ICD-10-CM | POA: Diagnosis not present

## 2021-12-12 DIAGNOSIS — R531 Weakness: Secondary | ICD-10-CM | POA: Diagnosis not present

## 2021-12-12 LAB — BASIC METABOLIC PANEL
Anion gap: 9 (ref 5–15)
BUN: 17 mg/dL (ref 8–23)
CO2: 24 mmol/L (ref 22–32)
Calcium: 8.6 mg/dL — ABNORMAL LOW (ref 8.9–10.3)
Chloride: 103 mmol/L (ref 98–111)
Creatinine, Ser: 0.76 mg/dL (ref 0.61–1.24)
GFR, Estimated: >60 mL/min (ref >60)
Glucose, Bld: 119 mg/dL — ABNORMAL HIGH (ref 70–99)
Potassium: 3.9 mmol/L (ref 3.5–5.1)
Sodium: 136 mmol/L (ref 135–145)

## 2021-12-12 LAB — CBC WITH DIFFERENTIAL/PLATELET
Abs Immature Granulocytes: 0.08 10*3/uL — ABNORMAL HIGH (ref 0.00–0.07)
Basophils Absolute: 0 10*3/uL (ref 0.0–0.1)
Basophils Relative: 0 %
Eosinophils Absolute: 0 10*3/uL (ref 0.0–0.5)
Eosinophils Relative: 0 %
HCT: 42.9 % (ref 39.0–52.0)
Hemoglobin: 14.6 g/dL (ref 13.0–17.0)
Immature Granulocytes: 1 %
Lymphocytes Relative: 8 %
Lymphs Abs: 0.8 10*3/uL (ref 0.7–4.0)
MCH: 30.8 pg (ref 26.0–34.0)
MCHC: 34 g/dL (ref 30.0–36.0)
MCV: 90.5 fL (ref 80.0–100.0)
Monocytes Absolute: 1.1 10*3/uL — ABNORMAL HIGH (ref 0.1–1.0)
Monocytes Relative: 11 %
Neutro Abs: 8.2 10*3/uL — ABNORMAL HIGH (ref 1.7–7.7)
Neutrophils Relative %: 80 %
Platelets: 158 10*3/uL (ref 150–400)
RBC: 4.74 MIL/uL (ref 4.22–5.81)
RDW: 14.6 % (ref 11.5–15.5)
WBC: 10.1 10*3/uL (ref 4.0–10.5)
nRBC: 0 % (ref 0.0–0.2)

## 2021-12-12 LAB — TROPONIN I (HIGH SENSITIVITY): Troponin I (High Sensitivity): <2 ng/L (ref <18)

## 2021-12-12 MED ORDER — SODIUM CHLORIDE 0.9 % IV BOLUS
500.0000 mL | Freq: Once | INTRAVENOUS | Status: AC
  Administered 2021-12-12: 500 mL via INTRAVENOUS

## 2021-12-12 NOTE — ED Provider Notes (Signed)
[facility] EMERGENCY DEPT Provider Note   [name]: [id number] Arrival date & time: 12/12/21  9242     History  Chief Complaint  Patient presents with   Shortness of Breath    David Armstrong is a 71 y.o. male.  Patient here with generalized weakness, shortness of breath, feels like he is dehydrated.  Some increased activity yesterday.  Has been urinating a lot.  Feels little bit short of breath.  Has a history of blood clot on Xarelto.  Denies any chest pain.  Lives by himself.  Denies any suspicious food intake or nausea, vomiting, diarrhea.  Denies any headache, weakness, numbness.  Denies any fever, chills, cough.  Has history of IBS, kidney stone, anxiety, depression.  The history is provided by the patient.       Home Medications Prior to Admission medications   Medication Sig Start Date End Date Taking? Authorizing Provider  alendronate (FOSAMAX) 70 MG tablet Take 70 mg by mouth once a week. 08/28/21   [provider]  betamethasone dipropionate 0.05 % cream Apply topically 2 (two) times daily. 10/10/20   Vivi Barrack, MD  calcitonin, salmon, (MIACALCIN) 200 UNIT/ACT nasal spray Place 1 spray into alternate nostrils daily. 06/12/21   [name], DO  desonide (DESOWEN) 0.05 % lotion APPLY TOPICALLY TO THE AFFECTED AREA EVERY DAY AS NEEDED 05/31/21   Vivi Barrack, MD  Elastic Bandages & Supports (MEDICAL COMPRESSION STOCKINGS) MISC Use daily. 07/13/20   Vivi Barrack, MD  fluticasone [facility]) 50 MCG/ACT nasal spray Place 2 sprays into both nostrils daily. USE twice a day for 1 week, then reduce to using daily. 09/16/21   Jeanie Sewer, NP  furosemide (LASIX) 20 MG tablet Take 1 tablet (20 mg total) by mouth daily as needed. 06/17/21   Vivi Barrack, MD  hyoscyamine (LEVSIN SL) 0.125 MG SL tablet Take 2 tablets (0.25 mg total) by mouth every 6 (six) hours as needed. DISSOLVE 1 TABLET(0.125 MG) UNDER THE TONGUE EVERY 6 HOURS AS NEEDED 04/09/21    Inda Coke, PA  ibandronate (BONIVA) 150 MG tablet Take 150 mg by mouth every 30 (thirty) days. 12/05/21   [provider]  linaclotide Rolan Lipa) 145 MCG CAPS capsule Take 1 capsule (145 mcg total) by mouth daily before breakfast. 07/12/21   Vivi Barrack, MD  LORazepam (ATIVAN) 1 MG tablet Take 0.5-1 tablets (0.5-1 mg total) by mouth every 8 (eight) hours as needed for anxiety. 05/26/17   Vivi Barrack, MD  nystatin-triamcinolone ointment Lilyan Gilford)  10/08/21   [provider]  omeprazole (PRILOSEC) 20 MG capsule Take 1 capsule (20 mg total) by mouth daily. 10/30/21   Vivi Barrack, MD  potassium chloride (KLOR-CON M) 10 MEQ tablet Take 1 tablet (10 mEq total) by mouth daily. 07/02/21   Vivi Barrack, MD  prednisoLONE acetate (PRED FORTE) 1 % ophthalmic suspension Place 1 drop into the left eye 4 (four) times daily. 12/03/21   [provider]  rivaroxaban (XARELTO) 20 MG TABS tablet Take 1 tablet (20 mg total) by mouth daily with supper. Start 20 mg daily tablets after completion of 3 weeks of 15 mg BID dosing. 02/10/20   Inda Coke, PA  tamsulosin (FLOMAX) 0.4 MG CAPS capsule Take 0.4 mg by mouth daily. 12/10/20   [provider]  tiZANidine (ZANAFLEX) 4 MG tablet Take 1 tablet (4 mg total) by mouth every 6 (six) hours as needed for muscle spasms. 04/25/21   Vivi Barrack,  MD  traMADol (ULTRAM) 50 MG tablet Take 50 mg by mouth 3 (three) times daily as needed. 10/23/21   [provider]  triamcinolone cream (KENALOG) 0.1 % Apply topically 2 (two) times daily as needed. 10/17/21   [provider]      Allergies    Effexor xr [venlafaxine hcl er], Acyclovir and related, Zocor [simvastatin], Dexilant [dexlansoprazole], Ivp dye [iodinated contrast media], Soma [carisoprodol], and Sulfa antibiotics    Review of Systems   Review of Systems  Physical Exam Updated Vital Signs BP 129/86   Pulse 88   Temp 98.2 F (36.8 C) (Oral)   Resp  (!) 25   Ht '6\' 1"'$  (1.854 m)   Wt 71.2 kg   SpO2 93%   BMI 20.71 kg/m  Physical Exam Vitals and nursing note reviewed.  Constitutional:      General: He is not in acute distress.    Appearance: He is well-developed. He is not ill-appearing.  HENT:     Head: Normocephalic and atraumatic.     Mouth/Throat:     Mouth: Mucous membranes are moist.  Eyes:     Extraocular Movements: Extraocular movements intact.     Conjunctiva/sclera: Conjunctivae normal.     Pupils: Pupils are equal, round, and reactive to light.  Cardiovascular:     Rate and Rhythm: Normal rate and regular rhythm.     Pulses: Normal pulses.     Heart sounds: Normal heart sounds. No murmur heard. Pulmonary:     Effort: Pulmonary effort is normal. No respiratory distress.     Breath sounds: Normal breath sounds. No decreased breath sounds.  Abdominal:     Palpations: Abdomen is soft.     Tenderness: There is no abdominal tenderness.  Musculoskeletal:        General: No swelling.     Cervical back: Normal range of motion and neck supple.  Skin:    General: Skin is warm and dry.     Capillary Refill: Capillary refill takes less than 2 seconds.  Neurological:     General: No focal deficit present.     Mental Status: He is alert.  Psychiatric:        Mood and Affect: Mood normal.     ED Results / Procedures / Treatments   Labs (all labs ordered are listed, but only abnormal results are displayed) Labs Reviewed  CBC WITH DIFFERENTIAL/PLATELET - Abnormal; Notable for the following components:      Result Value   Neutro Abs 8.2 (*)    Monocytes Absolute 1.1 (*)    Abs Immature Granulocytes 0.08 (*)    All other components within normal limits  BASIC METABOLIC PANEL - Abnormal; Notable for the following components:   Glucose, Bld 119 (*)    Calcium 8.6 (*)    All other components within normal limits  TROPONIN I (HIGH SENSITIVITY)    EKG EKG Interpretation  Date/Time:  Thursday December 12 2021 09:36:50  EDT Ventricular Rate:  97 PR Interval:  159 QRS Duration: 87 QT Interval:  322 QTC Calculation: 409 R Axis:   38 Text Interpretation: Sinus rhythm Confirmed by Lennice Sites ([id number]) on 12/12/2021 9:50:00 AM  Radiology DG Chest Portable 1 View  Result Date: 12/12/2021 CLINICAL DATA:  Shortness of breath.  History of pulmonary embolism. EXAM: PORTABLE CHEST 1 VIEW COMPARISON:  Radiographs 09/23/2019 and 07/03/2017.  CT 05/23/2017. FINDINGS: 0950 hours. Lower lung volumes with mildly increased left basilar atelectasis. The lungs are otherwise clear. There  is no pleural effusion or pneumothorax. The heart size and mediastinal contours are stable. No acute osseous findings are seen. Evidence of lower thoracic spinal augmentation. Telemetry leads overlie the chest. IMPRESSION: Mild left basilar atelectasis. No other evidence of active cardiopulmonary process. Electronically Signed   By: Richardean Sale M.D.   On: 12/12/2021 09:57    Procedures Procedures    Medications Ordered in ED Medications  sodium chloride 0.9 % bolus 500 mL (0 mLs Intravenous Stopped 12/12/21 1033)    ED Course/ Medical Decision Making/ A&P                           Medical Decision Making Amount and/or Complexity of Data Reviewed Labs: ordered. Radiology: ordered.   CAIRO AGOSTINELLI is here with weakness, shortness of breath.  Normal vitals.  No fever.  Well-appearing.  Clear breath sounds.  Differential diagnosis possibly dehydration versus less likely ACS, pneumonia, electrolyte abnormality.  Doubt PE as patient is on Xarelto.  Normal vitals.  Not hypoxic.  Normal work of breathing.  No wheezing on exam.  Doubt reactive airway process.  Could be viral process.  However has no fever.  We will get CBC, BMP, troponin, chest x-ray.  EKG per my review and interpretation shows sinus rhythm.  No ischemic changes.  Is not having any chest pain.  No signs of volume overload on exam.  Doubt heart failure.  Will give fluid  bolus.  Per my review and interpretation of labs is no significant anemia, electrolyte abnormality, kidney injury.  Troponin normal.  Chest x-ray per my review and interpretation with no evidence of pneumonia pneumothorax.  Overall given reassurance and discharged in ED in good condition.  No concern for PE or ACS.  Suspect may be mild dehydration/anxiety.  This chart was dictated using voice recognition software.  Despite best efforts to proofread,  errors can occur which can change the documentation meaning.         Final Clinical Impression(s) / ED Diagnoses Final diagnoses:  Weakness    Rx / DC Orders ED Discharge Orders     None         Lennice Sites, DO 12/12/21 1122

## 2021-12-12 NOTE — ED Triage Notes (Signed)
Pt arrives to ED with c/o shortness of breath that started today. 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 Has missed "some" xarelto doses.

## 2021-12-12 NOTE — Telephone Encounter (Signed)
Agree with him going to the ED.  [name]. [name], MD 12/12/2021 11:26 AM

## 2021-12-12 NOTE — Telephone Encounter (Signed)
Please see triage note; pt advised to go to ED

## 2021-12-12 NOTE — Telephone Encounter (Signed)
FYI--Per chart, pt arrived at ED 08/31 @ 09:26am.

## 2021-12-12 NOTE — Discharge Instructions (Signed)
Lab work today is unremarkable.  Follow-up with your primary care doctor.

## 2021-12-12 NOTE — Telephone Encounter (Signed)
Patient Name: David Armstrong JMEQ Gender: Male DOB: 07/31/1950 Age: 71 Y 54 M 24 D Return Phone Number: [phone] (Primary) Address: City/ State/ Zip: [place] [address] Client [facility] Client Site [place] [facility] [place] Day Provider [name]- MD Contact Type Call Who Is Calling Patient / Member / Family / Caregiver Call Type Triage / Clinical Relationship To Patient Self Return Phone Number [phone] (Primary) Chief Complaint BREATHING - shortness of breath or sounds breathless Reason for Call Symptomatic / Request for [place] states a patient woke up with labored breathing and weakness, he has a history of pulmonary embolism. [place] Not Listed [facility] Translation No Nurse Assessment Nurse: Kirk Ruths, RN, [place] Ped Date/Time (Eastern Time): 12/12/2021 9:04:25 AM Confirm and document reason for call. If symptomatic, describe symptoms. ---Caller states he feels exhausted and his breathing is labored even at rest has a history of PE in each lung . is on blood thinners denies chest pain has had a palpitation this morning Does the patient have any new or worsening symptoms? ---Yes Will a triage be completed? ---Yes Related visit to physician within the last 2 weeks? ---No Does the PT have any chronic conditions? (i.e. diabetes, asthma, this includes High risk factors for pregnancy, etc.) ---Yes List chronic conditions. ---PE dvt Is this a behavioral health or substance abuse call? ---No Guidelines Guideline Title Affirmed Question Affirmed Notes Nurse Date/Time (Eastern Time) Breathing Difficulty [1] MODERATE difficulty breathing (e.g., speaks in phrases, SOB even at Tristan Schroeder 12/12/2021 9:07:01 AM PLEASE NOTE: All timestamps contained within this report are represented as [place] Standard Time. CONFIDENTIALTY NOTICE: This fax transmission is intended  only for the addressee. It contains information that is legally privileged, confidential or otherwise protected from use or disclosure. If you are not the intended recipient, you are strictly prohibited from reviewing, disclosing, copying using or disseminating any of this information or taking any action in reliance on or regarding this information. If you have received this fax in error, please notify us immediately by telephone so that we can arrange for its return to [place]. Phone: [phone], Toll-Free: [phone], Fax: [phone] Page: 2 of 2 Call Id: [id number] Guidelines Guideline Title Affirmed Question Affirmed Notes Nurse Date/Time Eilene Ghazi Time) rest, pulse 100-120) AND [2] NEW-onset or WORSE than normal Disp. Time Eilene Ghazi Time) Disposition Final User 12/12/2021 9:02:29 AM Send to Urgent Queue Dara Hoyer 12/12/2021 9:08:48 AM Go to ED Now Yes Kirk Ruths, RN, [place] Ped Final Disposition 12/12/2021 9:08:48 AM Go to ED Now Yes Kirk Ruths, RN, Liana Gerold Disagree/Comply Comply Caller Understands Yes PreDisposition Call Doctor Care Advice Given Per Guideline GO TO ED NOW: * You need to be seen in the Emergency Department. * Leave now. Drive carefully. NOTE TO TRIAGER - DRIVING: * Another adult should drive. * Patient should not delay going to the emergency department. * If immediate transportation is not available via car, rideshare (e.g., Lyft, Uber), or taxi, then the patient should be instructed to call [id number]. BRING MEDICINES: * Bring a list of your current medicines when you go to the Emergency Department (ER). * Bring the pill bottles too. This will help the doctor (or NP/PA) to make certain you are taking the right medicines and the right dose. CARE ADVICE given per Breathing Difficulty (Adult) guideline. CALL EMS 911 IF: * Call EMS if you become worse. Referrals GO TO FACILITY OTHER - SPECIFY

## 2021-12-12 NOTE — Telephone Encounter (Signed)
Patient states: - He woke up and was experiencing labored breathing and generalized weakness - His symptoms are similar to when he had a pulmonary embolism in the past   Patient has been transferred to triage.

## 2021-12-19 ENCOUNTER — Telehealth: Payer: Self-pay | Admitting: Family Medicine

## 2021-12-19 NOTE — Telephone Encounter (Signed)
LAST APPOINTMENT DATE:  11/19/21  NEXT APPOINTMENT DATE: None  MEDICATION:hyoscyamine (LEVSIN SL) 0.125 MG SL tablet  omeprazole (PRILOSEC) 20 MG capsule  Is the patient out of medication? Yes  PHARMACY: [facility] [place], [address] AT [address]  [address] [place] [address]  Phone:  [phone]  Fax:  [phone]   Patient is also requesting multiple refills of each medication so he doesn't have to call in every month.

## 2021-12-20 ENCOUNTER — Other Ambulatory Visit: Payer: Self-pay | Admitting: *Deleted

## 2021-12-20 MED ORDER — OMEPRAZOLE 20 MG PO CPDR: 20.0000 mg | DELAYED_RELEASE_CAPSULE | Freq: Every day | ORAL | 3 refills | Status: DC

## 2021-12-20 MED ORDER — HYOSCYAMINE SULFATE 0.125 MG SL SUBL: 0.2500 mg | SUBLINGUAL_TABLET | Freq: Four times a day (QID) | SUBLINGUAL | 3 refills | Status: DC | PRN

## 2021-12-20 NOTE — Telephone Encounter (Signed)
Patient states Pharmacy listed in previous message has not received RX's for  hyoscyamine (LEVSIN SL) 0.125 MG SL tablet   And Pharmacy told Patient they do not have RX with refills for   omeprazole (PRILOSEC) 20 MG capsule  Patient states he is out of medication and requests the above RX's be sent asap to:    [id number] - [place], [address] AT [place] Phone:  [phone]  Fax:  [phone]

## 2021-12-20 NOTE — Telephone Encounter (Signed)
Rx send to [facility] pharmacy  ?

## 2021-12-21 ENCOUNTER — Other Ambulatory Visit: Payer: Self-pay | Admitting: *Deleted

## 2021-12-21 DIAGNOSIS — J309 Allergic rhinitis, unspecified: Secondary | ICD-10-CM

## 2021-12-21 MED ORDER — FLUTICASONE PROPIONATE 50 MCG/ACT NA SUSP: 2.0000 | Freq: Every day | NASAL | 6 refills | Status: DC

## 2021-12-23 ENCOUNTER — Telehealth: Payer: Self-pay | Admitting: Family Medicine

## 2021-12-23 ENCOUNTER — Other Ambulatory Visit: Payer: Self-pay

## 2021-12-23 DIAGNOSIS — L411 Pityriasis lichenoides chronica: Secondary | ICD-10-CM | POA: Diagnosis not present

## 2021-12-23 DIAGNOSIS — R233 Spontaneous ecchymoses: Secondary | ICD-10-CM | POA: Diagnosis not present

## 2021-12-23 NOTE — Telephone Encounter (Signed)
Patient Name: David Armstrong Gender: Male DOB: 11/15/1950 Age: 71 Y 67 M 4 D Return Phone Number: [phone] (Primary) Address: City/ State/ Zip: [place] [address] Client [facility] Client Site [place] [facility] [place] Day Provider [name]- MD Contact Type Call Who Is Calling Patient / Member / Family / Caregiver Call Type Triage / Clinical Relationship To Patient Self Return Phone Number [phone] (Primary) Chief Complaint CHEST PAIN - pain, pressure, heaviness or tightness Reason for Call Symptomatic / Request for [place] states he has severe pain below his rib cage on his left side and he is unable to lay on his left side. When he cough it goes straight to the painful area. Translation No Nurse Assessment Nurse: Ysidro Evert, RN, Lev[place] Dy Date/Time (Eastern Time): 12/23/2021 2:00:05 PM Confirm and document reason for call. If symptomatic, describe symptoms. ---Caller states he is having upper abdominal pain on the left side under his rib cage that started last night Does the patient have any new or worsening symptoms? ---Yes Will a triage be completed? ---Yes Related visit to physician within the last 2 weeks? ---No Does the PT have any chronic conditions? (i.e. diabetes, asthma, this includes High risk factors for pregnancy, etc.) ---No Is this a behavioral health or substance abuse call? ---No Guidelines Guideline Title Affirmed Question Affirmed Notes Nurse Date/Time Eilene Ghazi Time) Abdominal Pain - Upper [1] Pain lasts > 10 minutes AND [2] age > 27 Ysidro Evert, RN, Levada Dy 12/23/2021 2:01:12 PM Disp. Time Eilene Ghazi Time) Disposition Final User 12/23/2021 1:56:00 PM Send to Urgent Queue Benetta Spar 12/23/2021 2:04:06 PM Go to ED Now Yes Ysidro Evert, RN, Levada Dy  Final Disposition 12/23/2021 2:04:06 PM Go to ED Now Yes Ysidro Evert, RN, Marin Shutter Disagree/Comply  Comply Caller Understands Yes PreDisposition Did not know what to do Care Advice Given Per Guideline GO TO ED NOW: * You need to be seen in the Emergency Department. * Go to the ED at ___________ [place] now. Drive carefully. * Another adult should drive. * Patient should not delay going to the emergency department. * Confusion occurs * Passes out or becomes too weak to stand * Severe difficulty breathing occurs. CARE ADVICE given per Abdominal Pain - Upper (Adult) guideline. CALL EMS 911 IF: Referrals [facility] - ED

## 2021-12-23 NOTE — Telephone Encounter (Signed)
Patient states his left side below rib cage began having severe pain last night (12/22/21), unable to lay on left side, when taking a breath or coughing (dry cough) pain goes straight to left side painful area.  Transferred to Triage.

## 2021-12-24 ENCOUNTER — Inpatient Hospital Stay: Payer: Medicare Other | Admitting: Radiology

## 2021-12-24 ENCOUNTER — Emergency Department (HOSPITAL_BASED_OUTPATIENT_CLINIC_OR_DEPARTMENT_OTHER): Payer: Medicare Other

## 2021-12-24 ENCOUNTER — Encounter (HOSPITAL_COMMUNITY): Payer: Self-pay

## 2021-12-24 ENCOUNTER — Inpatient Hospital Stay (HOSPITAL_BASED_OUTPATIENT_CLINIC_OR_DEPARTMENT_OTHER)
Admission: EM | Admit: 2021-12-24 | Discharge: 2021-12-27 | DRG: 175 | Disposition: A | Discharge status: Home / Self Care | Payer: Medicare Other | Attending: Internal Medicine | Admitting: Internal Medicine

## 2021-12-24 ENCOUNTER — Other Ambulatory Visit: Payer: Self-pay

## 2021-12-24 ENCOUNTER — Emergency Department (HOSPITAL_BASED_OUTPATIENT_CLINIC_OR_DEPARTMENT_OTHER): Payer: Medicare Other | Admitting: Radiology

## 2021-12-24 ENCOUNTER — Encounter (HOSPITAL_BASED_OUTPATIENT_CLINIC_OR_DEPARTMENT_OTHER): Payer: Self-pay

## 2021-12-24 ENCOUNTER — Inpatient Hospital Stay (HOSPITAL_COMMUNITY): Payer: Medicare Other

## 2021-12-24 DIAGNOSIS — R0602 Shortness of breath: Secondary | ICD-10-CM | POA: Diagnosis not present

## 2021-12-24 DIAGNOSIS — Z91148 Patient's other noncompliance with medication regimen for other reason: Secondary | ICD-10-CM | POA: Diagnosis not present

## 2021-12-24 DIAGNOSIS — G629 Polyneuropathy, unspecified: Secondary | ICD-10-CM | POA: Diagnosis present

## 2021-12-24 DIAGNOSIS — I82431 Acute embolism and thrombosis of right popliteal vein: Secondary | ICD-10-CM | POA: Diagnosis present

## 2021-12-24 DIAGNOSIS — Z91199 Patient's noncompliance with other medical treatment and regimen due to unspecified reason: Secondary | ICD-10-CM

## 2021-12-24 DIAGNOSIS — I2609 Other pulmonary embolism with acute cor pulmonale: Principal | ICD-10-CM | POA: Diagnosis present

## 2021-12-24 DIAGNOSIS — Z881 Allergy status to other antibiotic agents status: Secondary | ICD-10-CM | POA: Diagnosis not present

## 2021-12-24 DIAGNOSIS — Z6820 Body mass index (BMI) 20.0-20.9, adult: Secondary | ICD-10-CM

## 2021-12-24 DIAGNOSIS — Z20822 Contact with and (suspected) exposure to covid-19: Secondary | ICD-10-CM | POA: Diagnosis present

## 2021-12-24 DIAGNOSIS — I248 Other forms of acute ischemic heart disease: Secondary | ICD-10-CM | POA: Diagnosis present

## 2021-12-24 DIAGNOSIS — Z7901 Long term (current) use of anticoagulants: Secondary | ICD-10-CM | POA: Diagnosis not present

## 2021-12-24 DIAGNOSIS — D696 Thrombocytopenia, unspecified: Secondary | ICD-10-CM | POA: Diagnosis present

## 2021-12-24 DIAGNOSIS — I2699 Other pulmonary embolism without acute cor pulmonale: Secondary | ICD-10-CM | POA: Diagnosis not present

## 2021-12-24 DIAGNOSIS — F419 Anxiety disorder, unspecified: Secondary | ICD-10-CM | POA: Diagnosis present

## 2021-12-24 DIAGNOSIS — E785 Hyperlipidemia, unspecified: Secondary | ICD-10-CM | POA: Diagnosis present

## 2021-12-24 DIAGNOSIS — I824Y1 Acute embolism and thrombosis of unspecified deep veins of right proximal lower extremity: Secondary | ICD-10-CM

## 2021-12-24 DIAGNOSIS — Z888 Allergy status to other drugs, medicaments and biological substances status: Secondary | ICD-10-CM | POA: Diagnosis not present

## 2021-12-24 DIAGNOSIS — I2602 Saddle embolus of pulmonary artery with acute cor pulmonale: Secondary | ICD-10-CM | POA: Diagnosis not present

## 2021-12-24 DIAGNOSIS — I2694 Multiple subsegmental pulmonary emboli without acute cor pulmonale: Secondary | ICD-10-CM | POA: Diagnosis not present

## 2021-12-24 DIAGNOSIS — X58XXXD Exposure to other specified factors, subsequent encounter: Secondary | ICD-10-CM | POA: Diagnosis present

## 2021-12-24 DIAGNOSIS — Z79899 Other long term (current) drug therapy: Secondary | ICD-10-CM

## 2021-12-24 DIAGNOSIS — Z86711 Personal history of pulmonary embolism: Secondary | ICD-10-CM | POA: Diagnosis not present

## 2021-12-24 DIAGNOSIS — R059 Cough, unspecified: Secondary | ICD-10-CM | POA: Diagnosis not present

## 2021-12-24 DIAGNOSIS — I82401 Acute embolism and thrombosis of unspecified deep veins of right lower extremity: Secondary | ICD-10-CM | POA: Diagnosis not present

## 2021-12-24 DIAGNOSIS — L8990 Pressure ulcer of unspecified site, unspecified stage: Secondary | ICD-10-CM | POA: Insufficient documentation

## 2021-12-24 DIAGNOSIS — R7989 Other specified abnormal findings of blood chemistry: Secondary | ICD-10-CM

## 2021-12-24 DIAGNOSIS — M4854XD Collapsed vertebra, not elsewhere classified, thoracic region, subsequent encounter for fracture with routine healing: Secondary | ICD-10-CM | POA: Diagnosis present

## 2021-12-24 DIAGNOSIS — I82411 Acute embolism and thrombosis of right femoral vein: Secondary | ICD-10-CM | POA: Diagnosis not present

## 2021-12-24 DIAGNOSIS — K219 Gastro-esophageal reflux disease without esophagitis: Secondary | ICD-10-CM | POA: Diagnosis present

## 2021-12-24 DIAGNOSIS — Z87442 Personal history of urinary calculi: Secondary | ICD-10-CM

## 2021-12-24 DIAGNOSIS — J9601 Acute respiratory failure with hypoxia: Secondary | ICD-10-CM | POA: Diagnosis not present

## 2021-12-24 DIAGNOSIS — R778 Other specified abnormalities of plasma proteins: Secondary | ICD-10-CM | POA: Diagnosis not present

## 2021-12-24 DIAGNOSIS — R636 Underweight: Secondary | ICD-10-CM | POA: Diagnosis present

## 2021-12-24 DIAGNOSIS — F319 Bipolar disorder, unspecified: Secondary | ICD-10-CM | POA: Diagnosis present

## 2021-12-24 HISTORY — PX: IR INFUSION THROMBOL ARTERIAL INITIAL (MS): IMG5376

## 2021-12-24 HISTORY — PX: IR US GUIDE VASC ACCESS RIGHT: IMG2390

## 2021-12-24 HISTORY — PX: IR ANGIOGRAM SELECTIVE EACH ADDITIONAL VESSEL: IMG667

## 2021-12-24 HISTORY — PX: IR ANGIOGRAM PULMONARY BILATERAL SELECTIVE: IMG664

## 2021-12-24 LAB — TROPONIN I (HIGH SENSITIVITY)
Troponin I (High Sensitivity): 123 ng/L (ref <18)
Troponin I (High Sensitivity): 359 ng/L (ref <18)
Troponin I (High Sensitivity): 355 ng/L (ref <18)
Troponin I (High Sensitivity): 307 ng/L (ref <18)

## 2021-12-24 LAB — CBC WITH DIFFERENTIAL/PLATELET
Abs Immature Granulocytes: 0.09 10*3/uL — ABNORMAL HIGH (ref 0.00–0.07)
Basophils Absolute: 0 10*3/uL (ref 0.0–0.1)
Basophils Relative: 0 %
Eosinophils Absolute: 0 10*3/uL (ref 0.0–0.5)
Eosinophils Relative: 0 %
HCT: 42.9 % (ref 39.0–52.0)
Hemoglobin: 14.4 g/dL (ref 13.0–17.0)
Immature Granulocytes: 1 %
Lymphocytes Relative: 7 %
Lymphs Abs: 0.7 10*3/uL (ref 0.7–4.0)
MCH: 30.5 pg (ref 26.0–34.0)
MCHC: 33.6 g/dL (ref 30.0–36.0)
MCV: 90.9 fL (ref 80.0–100.0)
Monocytes Absolute: 0.5 10*3/uL (ref 0.1–1.0)
Monocytes Relative: 5 %
Neutro Abs: 9.1 10*3/uL — ABNORMAL HIGH (ref 1.7–7.7)
Neutrophils Relative %: 87 %
Platelets: 134 10*3/uL — ABNORMAL LOW (ref 150–400)
RBC: 4.72 MIL/uL (ref 4.22–5.81)
RDW: 14.6 % (ref 11.5–15.5)
WBC: 10.5 10*3/uL (ref 4.0–10.5)
nRBC: 0 % (ref 0.0–0.2)

## 2021-12-24 LAB — COMPREHENSIVE METABOLIC PANEL
ALT: 23 U/L (ref 0–44)
AST: 23 U/L (ref 15–41)
Albumin: 3.9 g/dL (ref 3.5–5.0)
Alkaline Phosphatase: 90 U/L (ref 38–126)
Anion gap: 12 (ref 5–15)
BUN: 18 mg/dL (ref 8–23)
CO2: 23 mmol/L (ref 22–32)
Calcium: 8.8 mg/dL — ABNORMAL LOW (ref 8.9–10.3)
Chloride: 104 mmol/L (ref 98–111)
Creatinine, Ser: 0.7 mg/dL (ref 0.61–1.24)
GFR, Estimated: >60 mL/min (ref >60)
Glucose, Bld: 142 mg/dL — ABNORMAL HIGH (ref 70–99)
Potassium: 3.7 mmol/L (ref 3.5–5.1)
Sodium: 139 mmol/L (ref 135–145)
Total Bilirubin: 1.6 mg/dL — ABNORMAL HIGH (ref 0.3–1.2)
Total Protein: 6.9 g/dL (ref 6.5–8.1)

## 2021-12-24 LAB — HEPARIN LEVEL (UNFRACTIONATED)
Heparin Unfractionated: >1.1 IU/mL — ABNORMAL HIGH (ref 0.30–0.70)
Heparin Unfractionated: 0.37 IU/mL (ref 0.30–0.70)

## 2021-12-24 LAB — APTT
aPTT: >200 seconds (ref 24–36)
aPTT: 106 seconds — ABNORMAL HIGH (ref 24–36)

## 2021-12-24 LAB — RESP PANEL BY RT-PCR (FLU A&B, COVID) ARPGX2
Influenza A by PCR: NEGATIVE
Influenza B by PCR: NEGATIVE
SARS Coronavirus 2 by RT PCR: NEGATIVE

## 2021-12-24 LAB — BRAIN NATRIURETIC PEPTIDE: B Natriuretic Peptide: 137.6 pg/mL — ABNORMAL HIGH (ref 0.0–100.0)

## 2021-12-24 LAB — PROTIME-INR
INR: 1.3 — ABNORMAL HIGH (ref 0.8–1.2)
Prothrombin Time: 16.4 seconds — ABNORMAL HIGH (ref 11.4–15.2)

## 2021-12-24 LAB — LACTIC ACID, PLASMA: Lactic Acid, Venous: 1.1 mmol/L (ref 0.5–1.9)

## 2021-12-24 MED ORDER — SODIUM CHLORIDE 0.9 % IV SOLN: INTRAVENOUS | Status: DC

## 2021-12-24 MED ORDER — MIDAZOLAM HCL 2 MG/2ML IJ SOLN
INTRAMUSCULAR | Status: AC
  Filled 2021-12-24: qty 4

## 2021-12-24 MED ORDER — IOHEXOL 350 MG/ML SOLN
100.0000 mL | Freq: Once | INTRAVENOUS | Status: AC | PRN
  Administered 2021-12-24: 60 mL via INTRAVENOUS

## 2021-12-24 MED ORDER — CHLORHEXIDINE GLUCONATE CLOTH 2 % EX PADS
6.0000 | MEDICATED_PAD | Freq: Every day | CUTANEOUS | Status: DC
  Administered 2021-12-24 – 2021-12-27 (×4): 6 via TOPICAL

## 2021-12-24 MED ORDER — PANTOPRAZOLE SODIUM 40 MG IV SOLR
40.0000 mg | Freq: Every day | INTRAVENOUS | Status: DC
  Administered 2021-12-24 – 2021-12-25 (×2): 40 mg via INTRAVENOUS
  Filled 2021-12-24 (×2): qty 10

## 2021-12-24 MED ORDER — FENTANYL CITRATE (PF) 100 MCG/2ML IJ SOLN
INTRAMUSCULAR | Status: AC
  Filled 2021-12-24: qty 4

## 2021-12-24 MED ORDER — SODIUM CHLORIDE 0.9 % IV SOLN
1.0000 g | Freq: Once | INTRAVENOUS | Status: AC
  Administered 2021-12-24: 1 g via INTRAVENOUS
  Filled 2021-12-24: qty 10

## 2021-12-24 MED ORDER — SODIUM CHLORIDE 0.9 % IV SOLN
500.0000 mg | Freq: Once | INTRAVENOUS | Status: AC
  Administered 2021-12-24: 500 mg via INTRAVENOUS
  Filled 2021-12-24: qty 5

## 2021-12-24 MED ORDER — SODIUM CHLORIDE 0.9 % IV SOLN
12.0000 mg | Freq: Once | INTRAVENOUS | Status: AC
  Administered 2021-12-24: 12 mg via INTRAVENOUS
  Filled 2021-12-24 (×2): qty 12

## 2021-12-24 MED ORDER — IOHEXOL 300 MG/ML  SOLN
100.0000 mL | Freq: Once | INTRAMUSCULAR | Status: AC | PRN
  Administered 2021-12-24: 30 mL via INTRA_ARTERIAL

## 2021-12-24 MED ORDER — DIPHENHYDRAMINE HCL 50 MG/ML IJ SOLN
50.0000 mg | Freq: Once | INTRAMUSCULAR | Status: AC
  Administered 2021-12-24: 50 mg via INTRAVENOUS
  Filled 2021-12-24: qty 1

## 2021-12-24 MED ORDER — ALTEPLASE 2 MG IJ SOLR
4.0000 mg | Freq: Once | INTRAMUSCULAR | Status: AC
  Administered 2021-12-24: 2 mg

## 2021-12-24 MED ORDER — DIPHENHYDRAMINE HCL 25 MG PO CAPS: 50.0000 mg | ORAL_CAPSULE | Freq: Once | ORAL | Status: AC

## 2021-12-24 MED ORDER — METHYLPREDNISOLONE SODIUM SUCC 40 MG IJ SOLR
40.0000 mg | Freq: Once | INTRAMUSCULAR | Status: AC
  Administered 2021-12-24: 40 mg via INTRAVENOUS
  Filled 2021-12-24: qty 1

## 2021-12-24 MED ORDER — SODIUM CHLORIDE 0.9% FLUSH
3.0000 mL | Freq: Two times a day (BID) | INTRAVENOUS | Status: DC
  Administered 2021-12-24 – 2021-12-27 (×4): 3 mL via INTRAVENOUS

## 2021-12-24 MED ORDER — DIPHENHYDRAMINE HCL 50 MG/ML IJ SOLN
INTRAMUSCULAR | Status: AC
  Administered 2021-12-24: 50 mg via INTRAVENOUS
  Filled 2021-12-24: qty 1

## 2021-12-24 MED ORDER — MIDAZOLAM HCL 2 MG/2ML IJ SOLN
INTRAMUSCULAR | Status: AC | PRN
  Administered 2021-12-24: 1 mg via INTRAVENOUS

## 2021-12-24 MED ORDER — LIDOCAINE HCL 1 % IJ SOLN
INTRAMUSCULAR | Status: AC
  Filled 2021-12-24: qty 20

## 2021-12-24 MED ORDER — DIPHENHYDRAMINE HCL 50 MG/ML IJ SOLN: 50.0000 mg | Freq: Once | INTRAMUSCULAR | Status: AC

## 2021-12-24 MED ORDER — HEPARIN BOLUS VIA INFUSION
5000.0000 [IU] | Freq: Once | INTRAVENOUS | Status: AC
  Administered 2021-12-24: 5000 [IU] via INTRAVENOUS

## 2021-12-24 MED ORDER — DIPHENHYDRAMINE HCL 25 MG PO CAPS
50.0000 mg | ORAL_CAPSULE | Freq: Once | ORAL | Status: AC
  Administered 2021-12-24: 50 mg via ORAL

## 2021-12-24 MED ORDER — SODIUM CHLORIDE 0.9 % IV SOLN: INTRAVENOUS | Status: DC | PRN

## 2021-12-24 MED ORDER — ALTEPLASE 2 MG IJ SOLR
INTRAMUSCULAR | Status: AC
  Administered 2021-12-24: 2 mg
  Filled 2021-12-24: qty 4

## 2021-12-24 MED ORDER — HEPARIN (PORCINE) 25000 UT/250ML-% IV SOLN
1200.0000 [IU]/h | INTRAVENOUS | Status: AC
  Administered 2021-12-24 – 2021-12-25 (×3): 1200 [IU]/h via INTRAVENOUS
  Filled 2021-12-24 (×3): qty 250

## 2021-12-24 MED ORDER — MELATONIN 3 MG PO TABS
1.5000 mg | ORAL_TABLET | Freq: Every evening | ORAL | Status: AC | PRN
  Administered 2021-12-25: 1.5 mg
  Filled 2021-12-24: qty 1

## 2021-12-24 MED ORDER — SODIUM CHLORIDE 0.9 % IV SOLN: 250.0000 mL | INTRAVENOUS | Status: DC | PRN

## 2021-12-24 MED ORDER — MELATONIN 3 MG PO TABS: 3.0000 mg | ORAL_TABLET | Freq: Every evening | ORAL | Status: DC | PRN

## 2021-12-24 MED ORDER — METHYLPREDNISOLONE SODIUM SUCC 125 MG IJ SOLR
INTRAMUSCULAR | Status: AC
  Administered 2021-12-24: 125 mg
  Filled 2021-12-24: qty 2

## 2021-12-24 MED ORDER — ACETAMINOPHEN 325 MG PO TABS
650.0000 mg | ORAL_TABLET | Freq: Four times a day (QID) | ORAL | Status: DC | PRN
  Administered 2021-12-26: 650 mg via ORAL
  Filled 2021-12-24: qty 2

## 2021-12-24 MED ORDER — SODIUM CHLORIDE 0.9% FLUSH: 3.0000 mL | INTRAVENOUS | Status: DC | PRN

## 2021-12-24 NOTE — ED Notes (Signed)
CRITICAL VALUE STICKER  CRITICAL VALUE: Troponin 359  RECEIVER (on-site recipient of call): Kimiya Brunelle, [place] NOTIFIED: [id number]  MESSENGER (representative from lab): Silva Bandy  MD NOTIFIED: [place]  RESPONSE: See orders

## 2021-12-24 NOTE — ED Notes (Signed)
Repeat APTT and INR redrawn to verify

## 2021-12-24 NOTE — Progress Notes (Signed)
[place] for heparin Indication: pulmonary embolus/DVT  Allergies  Allergen Reactions   Effexor Xr [Venlafaxine Hcl Er] Other (See Comments)    Insomnia   Acyclovir And Related    Zocor [Simvastatin] Other (See Comments)    Muscle ache/ pain   Dexilant [Dexlansoprazole] Other (See Comments)    Muscle aches   Ivp Dye [Iodinated Contrast Media] Rash   Soma [Carisoprodol] Other (See Comments)    Sores on arm   Sulfa Antibiotics Rash    Patient Measurements: Height: '6\' 1"'$  (185.4 cm) Weight: 71.2 kg (157 lb) IBW/kg (Calculated) : 79.9 Heparin Dosing Weight: 71.2 kg  Vital Signs: Temp: 98.1 F (36.7 C) (09/12 1954) Temp Source: Axillary (09/12 1954) BP: 107/81 (09/12 1928) Pulse Rate: 104 (09/12 1928)  Labs: Recent Labs    12/24/21 0738 12/24/21 0944 12/24/21 1127 12/24/21 1303 12/24/21 1340 12/24/21 1410 12/24/21 1723  HGB 14.4  --   --   --   --   --   --   HCT 42.9  --   --   --   --   --   --   PLT 134*  --   --   --   --   --   --   APTT  --   --   --  >200*  --  106*  --   LABPROT  --   --   --  16.4*  --   --   --   INR  --   --   --  1.3*  --   --   --   HEPARINUNFRC  --   --   --  >1.10*  --   --  0.37  CREATININE 0.70  --   --   --   --   --   --   TROPONINIHS  --  359* 355*  --  307*  --   --      Estimated Creatinine Clearance: 86.5 mL/min (by C-G formula based on SCr of 0.7 mg/dL).   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 Previously was taking xarelto but has not been taking it - per patient report he says last time he took Xarelto was probably in May 2023 due to high pill burden at home (last fill 07/31/21 in Epic dispense records).   + for DVT in R fem/popliteal vein extending into calf on doppler. CT chest shows b/l PE with RHS. Transferred to [facility] and to IR for PE lysis. Pt on tPA '1mg'$ /hr per catheter x 12 hours (total dose 24 mg).  Heparin level  >1.1 at [facility] - appears to have been drawn incorrectly. Repeat level 0.37 (therapeutic) on infusion at 1200 units/hr. No bleeding noted.  Goal of Therapy:  Heparin level 0.3-0.7 units/ml Monitor platelets by anticoagulation protocol: Yes   Plan:  Continue heparin infusion at 1200 units/hr F/u 6hr heparin level, CBC. fibrinogen every 6 hours per protocol (notify MD if <150)  Sherlon Handing, PharmD, BCPS Please see amion for complete clinical pharmacist phone list 12/24/2021 8:00 PM

## 2021-12-24 NOTE — ED Notes (Signed)
Handoff report given to [place] on [place] at [facility]

## 2021-12-24 NOTE — ED Notes (Signed)
CRITICAL VALUE STICKER  CRITICAL VALUE: Troponin 355  RECEIVER (on-site recipient of call): [place] NOTIFIED: [id number]  MESSENGER (representative from lab): Tarry Kos  MD NOTIFIED: Armandina Gemma  TIME OF NOTIFICATION:1250  RESPONSE: See Orders

## 2021-12-24 NOTE — ED Notes (Signed)
[facility] at bedside 

## 2021-12-24 NOTE — ED Triage Notes (Signed)
With waking this am states having SOB.  Cough productive yellowish.  Worse with excretion.

## 2021-12-24 NOTE — ED Notes (Signed)
CRITICAL VALUE STICKER  CRITICAL VALUE: Troponin 123  RECEIVER (on-site recipient of call): Fara Chute, [place] NOTIFIED: [phone] 12/24/2021  MESSENGER (representative from lab):Phyllis  MD NOTIFIED: [place]  TIME OF NOTIFICATION: 0850  RESPONSE: See orders

## 2021-12-24 NOTE — H&P (Signed)
NAME:  David Armstrong, MRN:  [id number], DOB:  [birth year], LOS: 0 ADMISSION DATE:  12/24/2021, CONSULTATION DATE:  9/12 REFERRING MD:  Armandina Gemma, CHIEF COMPLAINT:  SOB   History of Present Illness:  David Armstrong, is a 71 y.o. male, who presented to the [facility] with a chief complaint of SOB  He has a pertinent past medical history of DVT/Bilateral PE (2017).  Per notes he was prescribed Eliquis but reported that he has not been taking the medication for the past few months.  He developed shortness of breath that woke him up on 9/12.  Endorses pain and cramping in right lower extremity.  Also endorses productive cough.  He presented to [facility].  CTA chest notable for bilateral left and right pulmonary embolus extending into all lobes and segmental branches on the right, with occlusive thrombus in right lower lobe pulmonary artery.  Extensive left pulmonary emboli emboli extending into the segmental and lobar level branches with occlusive thrombus in segmental branches of left lower lobe.  Per radiology RV/LV ratio 2.12.  Left lower lobe suspicious for pulmonary infarction. Dopplers positive for DVT right femoral and popliteal veins extending into right calf, negative left DVT. While in the ED he became hypoxic requiring 4LNC, HR 111-121, normotensive. Afebrile.   PCCM was consulted for admission  Pertinent  Medical History  DVT/Bilateral PE (2017), GERD, IBS, peripheral neuropathy, HLD, nephrolithiasis, lumbar disk disease, TMJ,Depression/Anxiety, diverticulosis  Significant Hospital Events: Including procedures, antibiotic start and stop dates in addition to other pertinent events   9/12 Presented to [facility], CTA> BL PE, Venous duplex> Fem and popliteal DVT on RT, heparin GTT, PCCM consult.  Interim History / Subjective:  See above  Denies chest pain, endorses some SOB  Objective   Blood pressure 104/80, pulse (!) 107, temperature 97.6 F (36.4 C), temperature source Oral, resp. rate (!) 25,  height '6\' 1"'$  (1.854 m), weight 71.2 kg, SpO2 93 %. On 4LNC    FiO2 (%):  [93 %] 93 %  No intake or output data in the 24 hours ending 12/24/21 1650 Filed Weights   12/24/21 0716  Weight: 71.2 kg    Examination: General: In bed, NAD, appears comfortable HEENT: MM pink/moist, anicteric, atraumatic Neuro: RASS 0, PERRL 74m, GCS 15 CV: S1S2, ST, no m/r/g appreciated PULM:  air movement in all lobes lobes, trachea midline, chest expansion symmetric GI: soft, bsx4 active, non-tender   Extremities: warm/dry, no pretibial edema, capillary refill less than 3 seconds  Skin:  no rashes or lesions noted  Labs/imaging BNP 137, troponin 123 > 359 > 355> 307 INR 1.3 Glucose 142 Bilirubin 1.6 Platelets 134 Chest x-ray: Question (left lower lobe pneumonia versus atelectasis, no pneumothorax, no effusion CTA chest: Bilateral left and right pulmonary embolus extending into all lobes and segmental branches on the right, with occlusive thrombus in right lower lobe pulmonary artery.  Extensive left pulmonary emboli emboli extending into the segmental and lobar level branches with occlusive thrombus in segmental branches of left lower lobe.  Per radiology RV/LV ratio 2.12.  Left lower lobe suspicious for pulmonary infarction. Lower extremity DVT: Positive for DVT right femoral and popliteal veins extending into right calf, negative left DVT 12 lead: ST, no significant ST changes noted   Resolved Hospital Problem list     Assessment & Plan:  Bilateral left and right pulmonary embolus extending into all lobes as seen on CTA 9/12 Right femoral and popliteal DVT, secondary to history of DVT with anticoagulation noncompliance. Left  lower lobe pulmonary infarction secondary to PE Acute respiratory failure with hypoxia secondary to PE. Patient with history of DVT/PE.  Reports not taking Eliquis recently.  Ultrasound positive for right femoral popliteal DVT.  CTA 9/12 positive for bilateral PE and LLL  pulmonary infarction. Desats in ED to 88%. Placed on 4L O2. S PESI score 1, high risk. -Transfer to [employer].  Admit to ICU -IR consult -Continuous telemetry -Continue heparin drip -Obtain echo  Troponin elevation, suspect demand BNP elevation Signs of right heart strain on CTA. BNP 137, troponin 123 > 359 > 355> 307 12 lead: ST, no significant ST changes noted -trend -Monitor telemetry -Continue heparin drip  Thrombocytopenia Bilirubinemia Suspect secondary to multiple thrombosis -If no signs of active bleeding, consider PLT transfusion if less than 10K.If signs of active bleeding or surgical procedure indicated, consider PLT transfusion less than 50K -Follow up AM PLT levels -Monitor for signs of active bleeding  HX GERD -PPI  HX nephrolithiasis Has not followed up with urology OP. Seen on CT 4/11. Abdominal exam benign -Monitor exam  Chronic compression fractures of throacic vertebra Underwent kyphoplasty sometime in May 2023 -monitor exam  Depression Anxiety -screening per unit protocol  Best Practice (right click and "Reselect all SmartList Selections" daily)   Diet/type: NPO DVT prophylaxis: systemic heparin GI prophylaxis: PPI Lines: N/A Foley:  N/A Code Status:  full code Last date of multidisciplinary goals of care discussion [Full scope- would not want to be on a ventilator if he was "brain dead" or a "vegetable"]  Labs   CBC: Recent Labs  Lab 12/24/21 0738  WBC 10.5  NEUTROABS 9.1*  HGB 14.4  HCT 42.9  MCV 90.9  PLT 134*    Basic Metabolic Panel: Recent Labs  Lab 12/24/21 0738  NA 139  K 3.7  CL 104  CO2 23  GLUCOSE 142*  BUN 18  CREATININE 0.70  CALCIUM 8.8*   GFR: Estimated Creatinine Clearance: 86.5 mL/min (by C-G formula based on SCr of 0.7 mg/dL). Recent Labs  Lab 12/24/21 0738 12/24/21 1303  WBC 10.5  --   LATICACIDVEN  --  1.1    Liver Function Tests: Recent Labs  Lab 12/24/21 0738  AST 23  ALT 23  ALKPHOS 90   BILITOT 1.6*  PROT 6.9  ALBUMIN 3.9   No results for input(s): "LIPASE", "AMYLASE" in the last 168 hours. No results for input(s): "AMMONIA" in the last 168 hours.  ABG No results found for: "PHART", "PCO2ART", "PO2ART", "HCO3", "TCO2", "ACIDBASEDEF", "O2SAT"   Coagulation Profile: Recent Labs  Lab 12/24/21 1303  INR 1.3*    Cardiac Enzymes: No results for input(s): "CKTOTAL", "CKMB", "CKMBINDEX", "TROPONINI" in the last 168 hours.  HbA1C: Hgb A1c MFr Bld  Date/Time Value Ref Range Status  08/20/2020 11:37 AM 5.6 4.6 - 6.5 % Final    Comment:    Glycemic Control Guidelines for People with Diabetes:Non Diabetic:  <6%Goal of Therapy: <7%Additional Action Suggested:  >8%     CBG: No results for input(s): "GLUCAP" in the last 168 hours.  Review of Systems:   Positives in bold  Gen: fever, chills, weight change, fatigue, night sweats,  HEENT:  blurred vision, double vision, hearing loss, tinnitus, sinus congestion, rhinorrhea, sore throat, neck stiffness, dysphagia PULM:  shortness of breath, cough, sputum production, hemoptysis, wheezing CV: chest pain, edema, orthopnea, paroxysmal nocturnal dyspnea, palpitations GI:  abdominal pain, nausea, vomiting, diarrhea, hematochezia, melena, constipation, change in bowel habits GU: dysuria, hematuria, polyuria, oliguria, urethral  discharge Endocrine: hot or cold intolerance, polyuria, polyphagia or appetite change Derm: rash, dry skin, scaling or peeling skin change Heme: easy bruising, bleeding, bleeding gums Neuro: headache, numbness, weakness, slurred speech, loss of memory or consciousness   Past Medical History:  He,  has a past medical history of Anxiety, Arthritis, Bipolar disorder ([place]), Chronic constipation, Depression, Diverticulosis of colon, Edema of both lower extremities, Fatty liver, GERD (gastroesophageal reflux disease), Hiatal hernia, History of anal fissures, History of deep vein thrombosis (DVT) of lower  extremity (62/37/6283), History of Helicobacter pylori infection (2012; 2009), History of kidney stones, History of pneumonia (01/2015), History of pulmonary embolus (PE) (02/11/2016), Hyperlipidemia, IBS (irritable bowel syndrome), Left ureteral stone, Nocturia, Peripheral neuropathy, Personal history of colonic polyps, Pulmonary nodule, right, TMJ (temporomandibular joint syndrome), and Varicosities of leg.   Surgical History:   Past Surgical History:  Procedure Laterality Date   BELPHAROPTOSIS REPAIR Bilateral 01/2015   CATARACT EXTRACTION W/ INTRAOCULAR LENS  IMPLANT, BILATERAL  04/2016   COLONOSCOPY WITH ESOPHAGOGASTRODUODENOSCOPY (EGD)  last one 03-02-2013   CYSTOSCOPY/RETROGRADE/URETEROSCOPY/STONE EXTRACTION WITH BASKET  2010   CYSTOSCOPY/URETEROSCOPY/HOLMIUM LASER/STENT PLACEMENT Left 03/16/2017   Procedure: CYSTOSCOPY/RETROGRADE/URETEROSCOPY/HOLMIUM LASER/STENT PLACEMENT, STONE BASKET EXTRACTION AND STENT PLACEMENT;  Surgeon: [name], MD;  Location: [facility];  Service: Urology;  Laterality: Left;   EXCISIONAL HEMORRHOIDECTOMY  2015 approx.   EXTRACORPOREAL SHOCK WAVE LITHOTRIPSY  1987; 1992   FOOT NEUROMA SURGERY Bilateral left 09/2015; right 04/ 2017   morton' neuroma   FOOT NEUROMA SURGERY Right 2004   morton's neuroma and removal heel spur   HAMMER TOE SURGERY Left 2013   5th toe   INCISION AND DRAINAGE FOOT Left 1992   "ball of foot; stepped on piece of hard wire & it went thru my shoe"   KNEE ARTHROSCOPY Bilateral left x2 1997;  right 2004 & 2005   [facility]   PATELLA-FEMORAL ARTHROPLASTY Right 07-06-2003   dr Eddie Dibbles [facility]   PLANTAR FASCIA RELEASE Bilateral 1987 & 1993   RHINOPLASTY  2000   SHOULDER ARTHROSCOPY Left 2012;  2013   SHOULDER ARTHROSCOPY WITH DISTAL CLAVICLE RESECTION Right 07-31-2005   dr Percell Miller  [facility]   w/ Debridement labral and adhesions, acromioplasty, bursectomy, and CA ligament release   TRANSTHORACIC ECHOCARDIOGRAM   02/12/2016   ef 15-17%, grade 1 diastolic dysfunction/  trivial TR     Social History:   reports that he has never smoked. He has never used smokeless tobacco. He reports that he does not drink alcohol and does not use drugs.   Family History:  His family history includes Cirrhosis in his father; Heart disease in his brother, father, and mother; Parkinson's disease in his paternal grandfather; Prostate cancer in his maternal grandfather; Skin cancer in his father; Stroke in his mother. There is no history of Colon cancer or Stomach cancer.   Allergies Allergies  Allergen Reactions   Effexor Xr [Venlafaxine Hcl Er] Other (See Comments)    Insomnia   Acyclovir And Related    Zocor [Simvastatin] Other (See Comments)    Muscle ache/ pain   Dexilant [Dexlansoprazole] Other (See Comments)    Muscle aches   Ivp Dye [Iodinated Contrast Media] Rash   Soma [Carisoprodol] Other (See Comments)    Sores on arm   Sulfa Antibiotics Rash     Home Medications  Prior to Admission medications   Medication Sig Start Date End Date Taking? Authorizing Provider  alendronate (FOSAMAX) 70 MG tablet Take 70 mg by  mouth once a week. 08/28/21   [provider]  betamethasone dipropionate 0.05 % cream Apply topically 2 (two) times daily. 10/10/20   Vivi Barrack, MD  calcitonin, salmon, (MIACALCIN) 200 UNIT/ACT nasal spray Place 1 spray into alternate nostrils daily. 06/12/21   [name], DO  desonide (DESOWEN) 0.05 % lotion APPLY TOPICALLY TO THE AFFECTED AREA EVERY DAY AS NEEDED 05/31/21   Vivi Barrack, MD  Elastic Bandages & Supports (MEDICAL COMPRESSION STOCKINGS) MISC Use daily. 07/13/20   Vivi Barrack, MD  fluticasone [facility]) 50 MCG/ACT nasal spray Place 2 sprays into both nostrils daily. USE twice a day for 1 week, then reduce to using daily. 12/21/21   Vivi Barrack, MD  furosemide (LASIX) 20 MG tablet Take 1 tablet (20 mg total) by mouth daily as needed. 06/17/21   Vivi Barrack,  MD  hyoscyamine (LEVSIN SL) 0.125 MG SL tablet Take 2 tablets (0.25 mg total) by mouth every 6 (six) hours as needed. DISSOLVE 1 TABLET(0.125 MG) UNDER THE TONGUE EVERY 6 HOURS AS NEEDED 12/20/21   Vivi Barrack, MD  ibandronate (BONIVA) 150 MG tablet Take 150 mg by mouth every 30 (thirty) days. 12/05/21   [provider]  linaclotide Rolan Lipa) 145 MCG CAPS capsule Take 1 capsule (145 mcg total) by mouth daily before breakfast. 07/12/21   Vivi Barrack, MD  LORazepam (ATIVAN) 1 MG tablet Take 0.5-1 tablets (0.5-1 mg total) by mouth every 8 (eight) hours as needed for anxiety. 05/26/17   Vivi Barrack, MD  nystatin-triamcinolone ointment Lilyan Gilford)  10/08/21   [provider]  omeprazole (PRILOSEC) 20 MG capsule Take 1 capsule (20 mg total) by mouth daily. 12/20/21   Vivi Barrack, MD  potassium chloride (KLOR-CON M) 10 MEQ tablet Take 1 tablet (10 mEq total) by mouth daily. 07/02/21   Vivi Barrack, MD  prednisoLONE acetate (PRED FORTE) 1 % ophthalmic suspension Place 1 drop into the left eye 4 (four) times daily. 12/03/21   [provider]  rivaroxaban (XARELTO) 20 MG TABS tablet Take 1 tablet (20 mg total) by mouth daily with supper. Start 20 mg daily tablets after completion of 3 weeks of 15 mg BID dosing. 02/10/20   Inda Coke, PA  tamsulosin (FLOMAX) 0.4 MG CAPS capsule Take 0.4 mg by mouth daily. 12/10/20   [provider]  tiZANidine (ZANAFLEX) 4 MG tablet Take 1 tablet (4 mg total) by mouth every 6 (six) hours as needed for muscle spasms. 04/25/21   Vivi Barrack, MD  traMADol (ULTRAM) 50 MG tablet Take 50 mg by mouth 3 (three) times daily as needed. 10/23/21   [provider]  triamcinolone cream (KENALOG) 0.1 % Apply topically 2 (two) times daily as needed. 10/17/21   [provider]     Critical care time: 35 minutes    The patient is critically ill with multiple organ systems failure and requires high complexity decision making  for assessment and support, frequent evaluation and titration of therapies, application of advanced monitoring technologies and extensive interpretation of multiple databases.    Critical Care Time devoted to patient care services described in this note is 35 minutes. This time reflects time of care of this [place] NP. This critical care time does not reflect procedure time but could involve care discussion time with the PCCM attending.  Redmond School., MSN, APRN, AGACNP-BC [facility]  12/24/2021 , 4:53 PM  Please see [email or web address]  for pager details  If no response, please call [phone] After hours, please call Elink at [phone]

## 2021-12-24 NOTE — Consult Note (Signed)
Chief Complaint: Patient was seen in consultation today for  Chief Complaint  Patient presents with   Shortness of Breath   at the request of Dr. Jacky Kindle  Referring Physician(s): Jacky Kindle, MD   Patient Status: [facility] - In-pt  History of Present Illness: David Armstrong is a 71 y.o. male presented to [place] earlier today with sudden onset of SOB.  CTA chest shows bilateral lobar PE and right heart strain (RV/LV 2.12) along with troponin leak and right dysfunction by bedside ECHO.  Findings are c/w intermediate-high risk PE with acute cor pulmonale.    He feels short of breath.  Denies chest pain.  No adverse effects from contrast administration earlier today.   Past Medical History:  Diagnosis Date   Anxiety    Arthritis    "lower back; hips;' right knee"    Bipolar disorder ([place])    Chronic constipation    Depression    Diverticulosis of colon    Edema of both lower extremities    ANKLES   Fatty liver    GERD (gastroesophageal reflux disease)    Hiatal hernia    History of anal fissures    History of deep vein thrombosis (DVT) of lower extremity 02/11/2016   right lower extremity distal and proximal extensive acute   History of Helicobacter pylori infection 2012; 2009   History of kidney stones    History of pneumonia 01/2015   CAP   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    Hyperlipidemia    IBS (irritable bowel syndrome)    Left ureteral stone    Nocturia    Peripheral neuropathy    per pt due to back DDD   Personal history of colonic polyps    HYPERPLASTIC POLYP   Pulmonary nodule, right    incidental finding CT chest angio 02-11-2016 stable   TMJ (temporomandibular joint syndrome)    Varicosities of leg     Past Surgical History:  Procedure Laterality Date   BELPHAROPTOSIS REPAIR Bilateral 01/2015   CATARACT EXTRACTION W/ INTRAOCULAR LENS  IMPLANT, BILATERAL  04/2016   COLONOSCOPY WITH  ESOPHAGOGASTRODUODENOSCOPY (EGD)  last one 03-02-2013   CYSTOSCOPY/RETROGRADE/URETEROSCOPY/STONE EXTRACTION WITH BASKET  2010   CYSTOSCOPY/URETEROSCOPY/HOLMIUM LASER/STENT PLACEMENT Left 03/16/2017   Procedure: CYSTOSCOPY/RETROGRADE/URETEROSCOPY/HOLMIUM LASER/STENT PLACEMENT, STONE BASKET EXTRACTION AND STENT PLACEMENT;  Surgeon: [name], MD;  Location: [place];  Service: Urology;  Laterality: Left;   EXCISIONAL HEMORRHOIDECTOMY  2015 approx.   EXTRACORPOREAL SHOCK WAVE LITHOTRIPSY  1987; 1992   FOOT NEUROMA SURGERY Bilateral left 09/2015; right 04/ 2017   morton' neuroma   FOOT NEUROMA SURGERY Right 2004   morton's neuroma and removal heel spur   HAMMER TOE SURGERY Left 2013   5th toe   INCISION AND DRAINAGE FOOT Left 1992   "ball of foot; stepped on piece of hard wire & it went thru my shoe"   KNEE ARTHROSCOPY Bilateral left x2 1997;  right 2004 & 2005   B and E   PATELLA-FEMORAL ARTHROPLASTY Right 07-06-2003   dr Eddie Dibbles [facility]   PLANTAR FASCIA RELEASE Bilateral 1987 & 1993   RHINOPLASTY  2000   SHOULDER ARTHROSCOPY Left 2012;  2013   SHOULDER ARTHROSCOPY WITH DISTAL CLAVICLE RESECTION Right 07-31-2005   dr Percell Miller  [facility]   w/ Debridement labral and adhesions, acromioplasty, bursectomy, and CA ligament release   TRANSTHORACIC ECHOCARDIOGRAM  02/12/2016   ef 27-03%, grade 1 diastolic dysfunction/  trivial TR    Allergies: Effexor xr [venlafaxine hcl er], Acyclovir and related, Zocor [simvastatin], Dexilant [dexlansoprazole], Ivp dye [iodinated contrast media], Soma [carisoprodol], and Sulfa antibiotics  Medications: Prior to Admission medications   Medication Sig Start Date End Date Taking? Authorizing Provider  desonide (DESOWEN) 0.05 % lotion APPLY TOPICALLY TO THE AFFECTED AREA EVERY DAY AS NEEDED 05/31/21  Yes Vivi Barrack, MD  fluticasone [facility]) 50 MCG/ACT nasal spray Place 2 sprays into both nostrils daily. USE twice a day for 1  week, then reduce to using daily. 12/21/21  Yes Vivi Barrack, MD  hyoscyamine (LEVSIN SL) 0.125 MG SL tablet Take 2 tablets (0.25 mg total) by mouth every 6 (six) hours as needed. DISSOLVE 1 TABLET(0.125 MG) UNDER THE TONGUE EVERY 6 HOURS AS NEEDED 12/20/21  Yes Vivi Barrack, MD  ibandronate (BONIVA) 150 MG tablet Take 150 mg by mouth every 30 (thirty) days. 12/05/21  Yes [provider]  LORazepam (ATIVAN) 1 MG tablet Take 0.5-1 tablets (0.5-1 mg total) by mouth every 8 (eight) hours as needed for anxiety. 05/26/17  Yes Vivi Barrack, MD  omeprazole (PRILOSEC) 20 MG capsule Take 1 capsule (20 mg total) by mouth daily. 12/20/21  Yes Vivi Barrack, MD  rivaroxaban (XARELTO) 20 MG TABS tablet Take 1 tablet (20 mg total) by mouth daily with supper. Start 20 mg daily tablets after completion of 3 weeks of 15 mg BID dosing. 02/10/20  Yes Inda Coke, PA  triamcinolone cream (KENALOG) 0.1 % Apply topically 2 (two) times daily as needed. 10/17/21  Yes [provider]  betamethasone dipropionate 0.05 % cream Apply topically 2 (two) times daily. Patient not taking: Reported on 12/24/2021 10/10/20   Vivi Barrack, MD  calcitonin, salmon, (MIACALCIN) 200 UNIT/ACT nasal spray Place 1 spray into alternate nostrils daily. Patient not taking: Reported on 12/24/2021 06/12/21   [name], DO  Elastic Bandages & Supports ([place]) MISC Use daily. 07/13/20   Vivi Barrack, MD  furosemide (LASIX) 20 MG tablet Take 1 tablet (20 mg total) by mouth daily as needed. Patient not taking: Reported on 12/24/2021 06/17/21   Vivi Barrack, MD  linaclotide [facility]) 145 MCG CAPS capsule Take 1 capsule (145 mcg total) by mouth daily before breakfast. Patient not taking: Reported on 12/24/2021 07/12/21   Vivi Barrack, MD  potassium chloride (KLOR-CON M) 10 MEQ tablet Take 1 tablet (10 mEq total) by mouth daily. Patient not taking: Reported on 12/24/2021 07/02/21   Vivi Barrack, MD   tiZANidine (ZANAFLEX) 4 MG tablet Take 1 tablet (4 mg total) by mouth every 6 (six) hours as needed for muscle spasms. Patient not taking: Reported on 12/24/2021 04/25/21   Vivi Barrack, MD  traMADol (ULTRAM) 50 MG tablet Take 50 mg by mouth 3 (three) times daily as needed. 10/23/21   [provider]     Family History  Problem Relation Age of Onset   Heart disease Mother    Stroke Mother    Cirrhosis Father    Heart disease Father    Skin cancer Father    Prostate cancer Maternal Grandfather    Parkinson's disease Paternal Grandfather    Heart disease Brother    Colon cancer Neg Hx    Stomach cancer Neg Hx     Social History   Socioeconomic History   Marital status: Divorced    Spouse name: Not on file   Number of children: 0   Years  of education: Not on file   Highest education level: Not on file  Occupational History   Occupation: retired    Fish farm manager: [employer]    Comment: disabled now  Tobacco Use   Smoking status: Never   Smokeless tobacco: Never  Vaping Use   Vaping Use: Never used  Substance and Sexual Activity   Alcohol use: No    Alcohol/week: 0.0 standard drinks of alcohol   Drug use: No   Sexual activity: Never  Other Topics Concern   Not on file  Social History Narrative   Goes by "[name]"   Divorced   No children   Retired from the post office   Has internet girlfriend on facebook from [place] Strain: [place]  (12/02/2021)   Overall Financial Resource Strain (CARDIA)    Difficulty of Paying Living Expenses: Not hard at all  Food Insecurity: No [place] (12/02/2021)   Hunger Vital Sign    Worried About Running Out of Food in the Last Year: Never true    [place] in the Last Year: Never true  Transportation Needs: No Transportation Needs (12/02/2021)   PRAPARE - Hydrologist (Medical): No    Lack of Transportation (Non-Medical): No  Physical  Activity: Insufficiently Active (11/19/2020)   Exercise Vital Sign    Days of Exercise per Week: 4 days    Minutes of Exercise per Session: 30 min  Stress: No Stress Concern Present (12/02/2021)   [place]    Feeling of Stress : Only a little  Social Connections: Socially Isolated (12/02/2021)   Social Connection and Isolation Panel [NHANES]    Frequency of Communication with Friends and Family: Three times a week    Frequency of Social Gatherings with Friends and Family: Never    Attends Religious Services: Never    Marine scientist or Organizations: No    Attends Music therapist: Never    Marital Status: Divorced    Review of Systems: A 12 point ROS discussed and pertinent positives are indicated in the HPI above.  All other systems are negative.  Review of Systems  Vital Signs: BP 104/80   Pulse (!) 107   Temp 97.6 F (36.4 C) (Oral)   Resp (!) 25   Ht '6\' 1"'$  (1.854 m)   Wt 71.2 kg   SpO2 93%   BMI 20.71 kg/m    Physical Exam Constitutional:      General: He is not in acute distress.    Appearance: Normal appearance. He is well-developed.  HENT:     Head: Normocephalic and atraumatic.  Eyes:     General: No scleral icterus. Cardiovascular:     Rate and Rhythm: Tachycardia present.  Pulmonary:     Effort: Tachypnea present.     Breath sounds: Normal breath sounds.  Skin:    General: Skin is warm and dry.  Neurological:     Mental Status: He is alert and oriented to person, place, and time.  Psychiatric:        Mood and Affect: Mood normal.        Behavior: Behavior normal.     Imaging: CT Angio Chest PE W and/or Wo Contrast  Result Date: 12/24/2021 CLINICAL DATA:  Suspected pulmonary embolism in a 71 year old male. EXAM: CT ANGIOGRAPHY CHEST WITH CONTRAST TECHNIQUE: Multidetector CT imaging of the  chest was performed using the standard protocol during bolus administration  of intravenous contrast. Multiplanar CT image reconstructions and MIPs were obtained to evaluate the vascular anatomy. RADIATION DOSE REDUCTION: This exam was performed according to the departmental dose-optimization program which includes automated exposure control, adjustment of the mA and/or kV according to patient size and/or use of iterative reconstruction technique. CONTRAST:  32m OMNIPAQUE IOHEXOL 350 MG/ML SOLN COMPARISON:  Chest CT of February 2019. FINDINGS: Cardiovascular: Main pulmonary artery is opacified to 552 Hounsfield units. There are bilateral LEFT and RIGHT pulmonary arterial emboli which extend into all segmental branches on the RIGHT with occlusive thrombotic changes in RIGHT lower lobe pulmonary artery. Pulmonary emboli also extending throughout the LEFT lobar and segmental branches with occlusive thrombus in segmental branch to the LEFT lower lobe laterally resulting in a focus of airspace disease compatible with pulmonary infarct. Elevated RV to LV ratio to 2.12. Marked RIGHT heart enlargement in the setting of RIGHT heart strain. No pericardial effusion or signs of pericardial nodularity. Aortic caliber is normal. Aorta not well assessed due to phase of contrast enhancement, maximize for evaluation of pulmonary vascular bed. Mediastinum/Nodes: No thoracic inlet, axillary, mediastinal or hilar adenopathy. Esophagus grossly normal. Lungs/Pleura: Patchy ground-glass opacities in addition to signs of pulmonary infarct the in the LEFT lower lobe. No lobar consolidative process. No pneumothorax. No pleural effusion. Airways are patent. Upper Abdomen: Incidental imaging of upper abdominal contents shows no acute process related to visualized portions the liver, gallbladder, pancreas, spleen, adrenal glands or kidneys. Nephrolithiasis in the upper pole the LEFT kidney partially visualized, at least 5 mm calculus in the upper pole. No upper abdominal lymphadenopathy. Musculoskeletal:  Redemonstration of compression fractures at T11 and T12. Slight increased loss of height at T11 with slightly greater than 50% loss of height at this level. Post cement augmentation at T12 as before. Loss of height at L1 is unchanged. Review of the MIP images confirms the above findings. IMPRESSION: 1. Bilateral LEFT and RIGHT pulmonary arterial emboli which extend into all lobar and segmental branches on the RIGHT with occlusive thrombosis in RIGHT lower lobe pulmonary artery. Extensive LEFT pulmonary artery emboli extending into segmental and lobar level branches as well with occlusive thrombus in segmental branches of the LEFT lower lobe. Positive for acute PE with CT evidence of marked right heart strain (RV/LV Ratio = 2.12) consistent with at least submassive (intermediate risk) PE. The presence of right heart strain has been associated with an increased risk of morbidity and mortality. Please refer to the "Code PE Focused" order set in EPIC. 2. 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 3. Interval loss of height at T11 at the site of previous compression fracture. Correlate with any new or recent worsening of. Symptoms in this area Findings of pulmonary emboli and RIGHT heart strain were related to the provider is outlined below. Critical Value/emergent results were called by telephone at the time of interpretation on 12/24/2021 at 12:35 pm to provider JRegan Lemming, who verbally acknowledged these results. Aortic Atherosclerosis (ICD10-I70.0). Electronically Signed   By: GZetta BillsM.D.   On: 12/24/2021 12:50   [place]Venous Img Lower Bilateral  Result Date: 12/24/2021 CLINICAL DATA:  BILATERAL leg swelling RIGHT greater than LEFT, history of DVT last summer EXAM: BILATERAL LOWER EXTREMITY VENOUS DOPPLER ULTRASOUND TECHNIQUE: Gray-scale sonography with graded compression, as well as color Doppler and duplex ultrasound were performed to evaluate the lower extremity deep venous systems from the  level of  the common femoral vein and including the common femoral, femoral, profunda femoral, popliteal and calf veins including the posterior tibial, peroneal and gastrocnemius veins when visible. The superficial great saphenous vein was also interrogated. Spectral Doppler was utilized to evaluate flow at rest and with distal augmentation maneuvers in the common femoral, femoral and popliteal veins. COMPARISON:  11/11/2016 FINDINGS: RIGHT LOWER EXTREMITY Common Femoral Vein: No evidence of thrombus. Normal compressibility, respiratory phasicity and response to augmentation. Saphenofemoral Junction: No evidence of thrombus. Normal compressibility and flow on color Doppler imaging. Profunda Femoral Vein: No evidence of thrombus. Normal compressibility and flow on color Doppler imaging. Femoral Vein: Hypoechoic intraluminal thrombus present. Impaired spontaneous venous flow and compressibility Popliteal Vein: Hypoechoic intraluminal thrombus present. Small portion of the thrombus in the RIGHT popliteal vein is slightly more echoic and age indeterminate. Impaired spontaneous venous flow and compressibility. Calf Veins: Intraluminal thrombus with impaired spontaneous venous flow and compressibility Superficial Great Saphenous Vein: No evidence of thrombus. Normal compressibility. Venous Reflux:  None. Other Findings:  None. LEFT LOWER EXTREMITY Common Femoral Vein: No evidence of thrombus. Normal compressibility, respiratory phasicity and response to augmentation. Saphenofemoral Junction: No evidence of thrombus. Normal compressibility and flow on color Doppler imaging. Profunda Femoral Vein: No evidence of thrombus. Normal compressibility and flow on color Doppler imaging. Femoral Vein: No evidence of thrombus. Normal compressibility, respiratory phasicity and response to augmentation. Popliteal Vein: No evidence of thrombus. Normal compressibility, respiratory phasicity and response to augmentation. Calf Veins: No  evidence of thrombus. Normal compressibility and flow on color Doppler imaging. Superficial Great Saphenous Vein: No evidence of thrombus. Normal compressibility. Venous Reflux:  None. Other Findings:  None. IMPRESSION: Positive exam for presence of deep venous thrombosis in the RIGHT femoral and popliteal veins extending into RIGHT calf. A small portion of the thrombus in the RIGHT popliteal vein is slightly more echoic and age-indeterminate, with remainder hypoechoic question acute. No evidence of deep venous thrombosis in the LEFT lower extremity. Electronically Signed   By: Lavonia Dana M.D.   On: 12/24/2021 08:49   DG Chest 2 View  Result Date: 12/24/2021 CLINICAL DATA:  Shortness of breath. Productive cough. Concern for pneumonia. EXAM: CHEST - 2 VIEW COMPARISON:  AP chest 12/12/2021, chest two views 07/03/2017; CT chest 05/23/2017 FINDINGS: Cardiac silhouette at the upper limits of normal size for AP technique. Limited evaluation of mediastinal contours by low lung volumes and AP technique. Mildly decreased lung volumes. Linear and heterogeneous airspace opacification of the left lower lung. The right lung is clear. No definite pleural effusion. No pneumothorax. Mild multilevel degenerative disc changes of the thoracic spine. Vertebral augmentation cement at the approximate T12 vertebral body level. Moderate anterior approximate T11, mild anterior approximate T12, and minimal anterior approximate L1 vertebral body height loss. IMPRESSION: 1. Mildly decreased lung volumes with left basilar airspace opacification may represent atelectasis versus pneumonia. 2. Mild anterior height loss of multiple lower thoracic and upper lumbar vertebral bodies. Electronically Signed   By: Yvonne Kendall M.D.   On: 12/24/2021 08:14   DG Chest Portable 1 View  Result Date: 12/12/2021 CLINICAL DATA:  Shortness of breath.  History of pulmonary embolism. EXAM: PORTABLE CHEST 1 VIEW COMPARISON:  Radiographs 09/23/2019 and  07/03/2017.  CT 05/23/2017. FINDINGS: 0950 hours. Lower lung volumes with mildly increased left basilar atelectasis. The lungs are otherwise clear. There is no pleural effusion or pneumothorax. The heart size and mediastinal contours are stable. No acute osseous findings are seen. Evidence of lower thoracic spinal  augmentation. Telemetry leads overlie the chest. IMPRESSION: Mild left basilar atelectasis. No other evidence of active cardiopulmonary process. Electronically Signed   By: Richardean Sale M.D.   On: 12/12/2021 09:57    Labs:  CBC: Recent Labs    07/23/21 1148 10/30/21 1205 12/12/21 1000 12/24/21 0738  WBC 6.4 5.9 10.1 10.5  HGB 13.7 14.3 14.6 14.4  HCT 41.4 42.7 42.9 42.9  PLT 221 145.0* 158 134*    COAGS: Recent Labs    12/24/21 1303 12/24/21 1410  INR 1.3*  --   APTT >200* 106*    BMP: Recent Labs    07/23/21 1148 10/30/21 1205 12/12/21 1000 12/24/21 0738  NA 141 143 136 139  K 3.6 4.9 3.9 3.7  CL 106 107 103 104  CO2 '29 26 24 23  '$ GLUCOSE 103* 92 119* 142*  BUN '13 22 17 18  '$ CALCIUM 9.0 8.8 8.6* 8.8*  CREATININE 0.80 0.86 0.76 0.70  GFRNONAA >60  --  >60 >60    LIVER FUNCTION TESTS: Recent Labs    07/09/21 0758 07/23/21 1148 10/30/21 1205 12/24/21 0738  BILITOT 0.8 0.8 0.7 1.6*  AST '19 17 19 23  '$ ALT '17 13 17 23  '$ ALKPHOS 133* 123 67 90  PROT 6.4 6.5 6.0 6.9  ALBUMIN 3.9 3.9 3.8 3.9    TUMOR MARKERS: No results for input(s): "AFPTM", "CEA", "CA199", "CHROMGRNA" in the last 8760 hours.  Assessment and Plan:  71 year-old male with acute intermediate-high risk (submassive) pulmonary emboli.  He is a candidate for bilateral catheter directed pulmonary arterial thrombolysis.  He has an allergy to contrast and requires urgent premedication.    1.) To IR for PE lysis  Thank you for this interesting consult.  I greatly enjoyed meeting LUDWIG TUGWELL and look forward to participating in their care.  A copy of this report was sent to the  requesting provider on this date.  Electronically Signed: Criselda Peaches, MD 12/24/2021, 5:30 PM   I spent a total of 20 Minutes  in face to face in clinical consultation, greater than 50% of which was counseling/coordinating care for intermediate-high risk acute pulmonary emboli.

## 2021-12-24 NOTE — Care Management (Signed)
  Transition of Care [facility]) Screening Note   Patient Details  Name: David Armstrong Date of Birth: [birth year]   Transition of Care [facility]) CM/SW Contact:    Bethena Roys, RN Phone Number: 12/24/2021, 4:46 PM    Transition of Care Department [facility]) has reviewed the patient and no TOC needs have been identified at this time. Case Manager did receive a consult for checking the cost for apixaban and rivaroxaban for PE. Benefits check submitted. Case Manager will continue to monitor patient advancement through interdisciplinary progression rounds. If new patient transition needs arise, please place a TOC consult.

## 2021-12-24 NOTE — Progress Notes (Signed)
ANTICOAGULATION CONSULT NOTE - Initial Consult  Pharmacy Consult for heparin Indication: pulmonary embolus/DVT  Allergies  Allergen Reactions   Effexor Xr [Venlafaxine Hcl Er] Other (See Comments)    Insomnia   Acyclovir And Related    Zocor [Simvastatin] Other (See Comments)    Muscle ache/ pain   Dexilant [Dexlansoprazole] Other (See Comments)    Muscle aches   Ivp Dye [Iodinated Contrast Media] Rash   Soma [Carisoprodol] Other (See Comments)    Sores on arm   Sulfa Antibiotics Rash    Patient Measurements: Height: '6\' 1"'$  (185.4 cm) Weight: 71.2 kg (157 lb) IBW/kg (Calculated) : 79.9 Heparin Dosing Weight: 71.2 kg  Vital Signs: Temp: 97.5 F (36.4 C) (09/12 0719) Temp Source: Oral (09/12 0719) BP: 117/90 (09/12 0728) Pulse Rate: 118 (09/12 0728)  Labs: Recent Labs    12/24/21 0730 12/24/21 0738  HGB  --  14.4  HCT  --  42.9  PLT  --  134*  CREATININE  --  0.70  TROPONINIHS 123*  --     Estimated Creatinine Clearance: 86.5 mL/min (by C-G formula based on SCr of 0.7 mg/dL).   Medical History: Past Medical History:  Diagnosis Date   Anxiety    Arthritis    "lower back; hips;' right knee"    Bipolar disorder ([place])    Chronic constipation    Depression    Diverticulosis of colon    Edema of both lower extremities    ANKLES   Fatty liver    GERD (gastroesophageal reflux disease)    Hiatal hernia    History of anal fissures    History of deep vein thrombosis (DVT) of lower extremity 02/11/2016   right lower extremity distal and proximal extensive acute   History of Helicobacter pylori infection 2012; 2009   History of kidney stones    History of pneumonia 01/2015   CAP   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    Hyperlipidemia    IBS (irritable bowel syndrome)    Left ureteral stone    Nocturia    Peripheral neuropathy    per pt due to back DDD   Personal history of colonic polyps    HYPERPLASTIC POLYP   Pulmonary  nodule, right    incidental finding CT chest angio 02-11-2016 stable   TMJ (temporomandibular joint syndrome)    Varicosities of leg     Assessment: 71 yo M with PMH multiple bilateral PEs, stents of DVT in RLE who presented with ShOB. Previously was taking xarelto but has not been taking it - per patient report he says last time he took Xarelto was probably in May 2023 due to high pill burden at home (last fill 07/31/21 in Epic dispense records).   + for DVT in R fem/popliteal vein extending into calf on doppler  Hgb 14.4/Plt 134 in ED  Goal of Therapy:  Heparin level 0.3-0.7 units/ml Monitor platelets by anticoagulation protocol: Yes   Plan:  Heparin bolus 5000 units IV x1 followed by Heparin infusion at 1200 units/hr 6hr HL at 1600 Daily HL, CBC, monitor s/sx bleeding F/u CTA PE F/u transition to enteral therapies    Wilson Singer, PharmD Clinical Pharmacist 12/24/2021 8:53 AM

## 2021-12-24 NOTE — ED Provider Notes (Signed)
[place] EMERGENCY DEPT Provider Note   [name]: [id number] Arrival date & time: 12/24/21  0626     History  Chief Complaint  Patient presents with   Shortness of Breath    David Armstrong is a 71 y.o. male.   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  The patient states that he was prescribed Xarelto but has not been taking the medication "for the past few months."  He endorses shortness of breath that woke him up this morning.  He has had a cough productive of mild yellow sputum.  He endorses dyspnea on exertion.  He endorses pain and cramping in his right lower extremity.  He feels that his symptoms are similar to when he had a prior PE.  He denies any fevers or chills.  He denies any active chest pain.  Home Medications Prior to Admission medications   Medication Sig Start Date End Date Taking? Authorizing Provider  alendronate (FOSAMAX) 70 MG tablet Take 70 mg by mouth once a week. 08/28/21   [provider]  betamethasone dipropionate 0.05 % cream Apply topically 2 (two) times daily. 10/10/20   Vivi Barrack, MD  calcitonin, salmon, (MIACALCIN) 200 UNIT/ACT nasal spray Place 1 spray into alternate nostrils daily. 06/12/21   [name], DO  desonide (DESOWEN) 0.05 % lotion APPLY TOPICALLY TO THE AFFECTED AREA EVERY DAY AS NEEDED 05/31/21   Vivi Barrack, MD  Elastic Bandages & Supports (MEDICAL COMPRESSION STOCKINGS) MISC Use daily. 07/13/20   Vivi Barrack, MD  fluticasone [facility]) 50 MCG/ACT nasal spray Place 2 sprays into both nostrils daily. USE twice a day for 1 week, then reduce to using daily. 12/21/21   Vivi Barrack, MD  furosemide (LASIX) 20 MG tablet Take 1 tablet (20 mg total) by  mouth daily as needed. 06/17/21   Vivi Barrack, MD  hyoscyamine (LEVSIN SL) 0.125 MG SL tablet Take 2 tablets (0.25 mg total) by mouth every 6 (six) hours as needed. DISSOLVE 1 TABLET(0.125 MG) UNDER THE TONGUE EVERY 6 HOURS AS NEEDED 12/20/21   Vivi Barrack, MD  ibandronate (BONIVA) 150 MG tablet Take 150 mg by mouth every 30 (thirty) days. 12/05/21   [provider]  linaclotide Rolan Lipa) 145 MCG CAPS capsule Take 1 capsule (145 mcg total) by mouth daily before breakfast. 07/12/21   Vivi Barrack, MD  LORazepam (ATIVAN) 1 MG tablet Take 0.5-1 tablets (0.5-1 mg total) by mouth every 8 (eight) hours as needed for anxiety. 05/26/17   Vivi Barrack, MD  nystatin-triamcinolone ointment Lilyan Gilford)  10/08/21   [provider]  omeprazole (PRILOSEC) 20 MG capsule Take 1 capsule (20 mg total) by mouth daily. 12/20/21   Vivi Barrack, MD  potassium chloride (KLOR-CON M) 10 MEQ tablet Take 1 tablet (10 mEq total) by mouth daily. 07/02/21   Vivi Barrack, MD  prednisoLONE acetate (PRED FORTE) 1 % ophthalmic suspension Place 1 drop into the left eye 4 (four) times daily. 12/03/21   [provider]  rivaroxaban (XARELTO) 20 MG TABS tablet Take 1 tablet (20 mg total) by mouth daily with supper. Start 20 mg daily tablets after completion of 3 weeks of 15 mg BID dosing. 02/10/20   Inda Coke, PA  tamsulosin (FLOMAX) 0.4 MG CAPS capsule Take 0.4 mg by mouth daily. 12/10/20   [provider]  tiZANidine (ZANAFLEX) 4 MG tablet Take 1 tablet (4 mg total) by mouth every 6 (six) hours as needed for muscle spasms. 04/25/21   Vivi Barrack, MD  traMADol (ULTRAM) 50 MG tablet Take 50 mg by mouth 3 (three) times daily as needed. 10/23/21   [provider]  triamcinolone cream (KENALOG) 0.1 % Apply topically 2 (two) times daily as needed. 10/17/21   [provider]      Allergies    Effexor xr [venlafaxine hcl er], Acyclovir and related, Zocor [simvastatin], Dexilant  [dexlansoprazole], Ivp dye [iodinated contrast media], Soma [carisoprodol], and Sulfa antibiotics    Review of Systems   Review of Systems  Respiratory:  Positive for cough and shortness of breath.   All other systems reviewed and are negative.   Physical Exam Updated Vital Signs BP (!) 110/92   Pulse (!) 111   Temp 97.6 F (36.4 C) (Oral)   Resp (!) 22   Ht '6\' 1"'$  (1.854 m)   Wt 71.2 kg   SpO2 97%   BMI 20.71 kg/m  Physical Exam Vitals and nursing note reviewed.  Constitutional:      General: He is not in acute distress.    Appearance: He is well-developed.  HENT:     Head: Normocephalic and atraumatic.  Eyes:     Conjunctiva/sclera: Conjunctivae normal.  Cardiovascular:     Rate and Rhythm: Regular rhythm. Tachycardia present.     Heart sounds: No murmur heard. Pulmonary:     Effort: Tachypnea present. No respiratory distress.     Breath sounds: Normal breath sounds. No rales.     Comments: On O2 via nasal cannula, mildly tachypneic Abdominal:     Palpations: Abdomen is soft.     Tenderness: There is no abdominal tenderness.  Musculoskeletal:        General: No swelling.     Cervical back: Neck supple.     Right lower leg: No edema.     Left lower leg: No edema.  Skin:    General: Skin is warm and dry.     Capillary Refill: Capillary refill takes less than 2 seconds.  Neurological:     Mental Status: He is alert.  Psychiatric:        Mood and Affect: Mood normal.     ED Results / Procedures / Treatments   Labs (all labs ordered are listed, but only abnormal results are displayed) Labs Reviewed  CBC WITH DIFFERENTIAL/PLATELET - Abnormal; Notable for the following components:      Result Value   Platelets 134 (*)    Neutro Abs 9.1 (*)    Abs Immature Granulocytes 0.09 (*)    All other components within normal limits  COMPREHENSIVE METABOLIC PANEL - Abnormal; Notable for the following components:   Glucose, Bld 142 (*)    Calcium 8.8 (*)    Total  Bilirubin 1.6 (*)    All other components within normal limits  BRAIN NATRIURETIC PEPTIDE - Abnormal; Notable for the following components:   B Natriuretic Peptide 137.6 (*)    All other components within normal limits  TROPONIN I (HIGH SENSITIVITY) - Abnormal; Notable for the following components:   Troponin I (High Sensitivity) 123 (*)    All other components within normal limits  TROPONIN I (HIGH SENSITIVITY) - Abnormal; Notable for the following components:   Troponin I (High Sensitivity) 359 (*)  All other components within normal limits  RESP PANEL BY RT-PCR (FLU A&B, COVID) ARPGX2  HEPARIN LEVEL (UNFRACTIONATED)  PROTIME-INR  APTT  LACTIC ACID, PLASMA  TROPONIN I (HIGH SENSITIVITY)    EKG EKG Interpretation  Date/Time:  Tuesday December 24 2021 10:58:29 EDT Ventricular Rate:  114 PR Interval:  163 QRS Duration: 82 QT Interval:  349 QTC Calculation: 481 R Axis:   44 Text Interpretation: Sinus tachycardia Low voltage, precordial leads Borderline prolonged QT interval Confirmed by Regan Lemming ([id number]) on 12/24/2021 11:23:31 AM  Radiology US Venous Img Lower Bilateral  Result Date: 12/24/2021 CLINICAL DATA:  BILATERAL leg swelling RIGHT greater than LEFT, history of DVT last summer EXAM: BILATERAL LOWER EXTREMITY VENOUS DOPPLER ULTRASOUND TECHNIQUE: Gray-scale sonography with graded compression, as well as color Doppler and duplex ultrasound were performed to evaluate the lower extremity deep venous systems from the level of the common femoral vein and including the common femoral, femoral, profunda femoral, popliteal and calf veins including the posterior tibial, peroneal and gastrocnemius veins when visible. The superficial great saphenous vein was also interrogated. Spectral Doppler was utilized to evaluate flow at rest and with distal augmentation maneuvers in the common femoral, femoral and popliteal veins. COMPARISON:  11/11/2016 FINDINGS: RIGHT LOWER EXTREMITY Common  Femoral Vein: No evidence of thrombus. Normal compressibility, respiratory phasicity and response to augmentation. Saphenofemoral Junction: No evidence of thrombus. Normal compressibility and flow on color Doppler imaging. Profunda Femoral Vein: No evidence of thrombus. Normal compressibility and flow on color Doppler imaging. Femoral Vein: Hypoechoic intraluminal thrombus present. Impaired spontaneous venous flow and compressibility Popliteal Vein: Hypoechoic intraluminal thrombus present. Small portion of the thrombus in the RIGHT popliteal vein is slightly more echoic and age indeterminate. Impaired spontaneous venous flow and compressibility. Calf Veins: Intraluminal thrombus with impaired spontaneous venous flow and compressibility Superficial Great Saphenous Vein: No evidence of thrombus. Normal compressibility. Venous Reflux:  None. Other Findings:  None. LEFT LOWER EXTREMITY Common Femoral Vein: No evidence of thrombus. Normal compressibility, respiratory phasicity and response to augmentation. Saphenofemoral Junction: No evidence of thrombus. Normal compressibility and flow on color Doppler imaging. Profunda Femoral Vein: No evidence of thrombus. Normal compressibility and flow on color Doppler imaging. Femoral Vein: No evidence of thrombus. Normal compressibility, respiratory phasicity and response to augmentation. Popliteal Vein: No evidence of thrombus. Normal compressibility, respiratory phasicity and response to augmentation. Calf Veins: No evidence of thrombus. Normal compressibility and flow on color Doppler imaging. Superficial Great Saphenous Vein: No evidence of thrombus. Normal compressibility. Venous Reflux:  None. Other Findings:  None. IMPRESSION: Positive exam for presence of deep venous thrombosis in the RIGHT femoral and popliteal veins extending into RIGHT calf. A small portion of the thrombus in the RIGHT popliteal vein is slightly more echoic and age-indeterminate, with remainder  hypoechoic question acute. No evidence of deep venous thrombosis in the LEFT lower extremity. Electronically Signed   By: Lavonia Dana M.D.   On: 12/24/2021 08:49   DG Chest 2 View  Result Date: 12/24/2021 CLINICAL DATA:  Shortness of breath. Productive cough. Concern for pneumonia. EXAM: CHEST - 2 VIEW COMPARISON:  AP chest 12/12/2021, chest two views 07/03/2017; CT chest 05/23/2017 FINDINGS: Cardiac silhouette at the upper limits of normal size for AP technique. Limited evaluation of mediastinal contours by low lung volumes and AP technique. Mildly decreased lung volumes. Linear and heterogeneous airspace opacification of the left lower lung. The right lung is clear. No definite pleural effusion. No pneumothorax. Mild multilevel degenerative disc changes of the  thoracic spine. Vertebral augmentation cement at the approximate T12 vertebral body level. Moderate anterior approximate T11, mild anterior approximate T12, and minimal anterior approximate L1 vertebral body height loss. IMPRESSION: 1. Mildly decreased lung volumes with left basilar airspace opacification may represent atelectasis versus pneumonia. 2. Mild anterior height loss of multiple lower thoracic and upper lumbar vertebral bodies. Electronically Signed   By: Yvonne Kendall M.D.   On: 12/24/2021 08:14    Procedures .Critical Care  Performed by: Regan Lemming, MD Authorized by: Regan Lemming, MD   Critical care provider statement:    Critical care time (minutes):  120   Critical care was necessary to treat or prevent imminent or life-threatening deterioration of the following conditions:  Respiratory failure   Critical care was time spent personally by me on the following activities:  Development of treatment plan with patient or surrogate, discussions with consultants, evaluation of patient's response to treatment, examination of patient, ordering and review of laboratory studies, ordering and review of radiographic studies, ordering and  performing treatments and interventions, pulse oximetry, re-evaluation of patient's condition and review of old charts   Care discussed with: admitting provider       Medications Ordered in ED Medications  0.9 %  sodium chloride infusion ( Intravenous New Bag/Given 12/24/21 0848)  heparin ADULT infusion 100 units/mL (25000 units/249m) (1,200 Units/hr Intravenous New Bag/Given 12/24/21 0921)  methylPREDNISolone sodium succinate (SOLU-MEDROL) 40 mg/mL injection 40 mg (40 mg Intravenous Given 12/24/21 0742)  diphenhydrAMINE (BENADRYL) capsule 50 mg ( Oral See Alternative 12/24/21 0742)    Or  diphenhydrAMINE (BENADRYL) injection 50 mg (50 mg Intravenous Given 12/24/21 0742)  cefTRIAXone (ROCEPHIN) 1 g in sodium chloride 0.9 % 100 mL IVPB (0 g Intravenous Stopped 12/24/21 0939)  azithromycin (ZITHROMAX) 500 mg in sodium chloride 0.9 % 250 mL IVPB (0 mg Intravenous Stopped 12/24/21 1134)  heparin bolus via infusion 5,000 Units (5,000 Units Intravenous Bolus from Bag 12/24/21 0926)  diphenhydrAMINE (BENADRYL) capsule 50 mg (50 mg Oral Given 12/24/21 1055)    Or  diphenhydrAMINE (BENADRYL) injection 50 mg ( Intravenous See Alternative 12/24/21 1055)  iohexol (OMNIPAQUE) 350 MG/ML injection 100 mL (60 mLs Intravenous Contrast Given 12/24/21 1200)    ED Course/ Medical Decision Making/ A&P Clinical Course as of 12/24/21 1237  Tue Dec 24, 2021  0826 Pulse Rate(!): 116 [JL]  0826 Resp(!): 22 [JL]  0826 SpO2: 93 % [JL]  0826 O2 Flow Rate (L/min): 2 L/min [JL]  0851 Troponin I (High Sensitivity)(!!): 123 [JL]    Clinical Course User Index [JL] LRegan Lemming MD                           Medical Decision Making Amount and/or Complexity of Data Reviewed Labs: ordered. Decision-making details documented in ED Course. Radiology: ordered.  Risk Prescription drug management. Decision regarding hospitalization.    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  The patient states that he was prescribed Xarelto but has not been taking the medication "for the past few months."  He endorses shortness of breath that woke him up this morning.  He has had a cough productive of mild yellow sputum.  He endorses dyspnea on exertion.  He endorses pain and cramping in his right lower extremity.  He feels  that his symptoms are similar to when he had a prior PE.  He denies any fevers or chills.  He denies any active chest pain.  Arrival, the patient was afebrile, temperature 97.5, tachycardic P116, tachypneic RR 22, BP 117/90, saturating 93% on room air.  Some desats to 88% noted and the patient was placed on 2 L O2 via nasal cannula.  My primary concern is for recurrent DVT and PE in the setting of medication noncompliance.  Additionally considered ACS, pneumothorax, pneumonia, viral URI, COVID-19.  EKG significant for sinus tachycardia, ventricular rate 114, borderline prolonged QTc with a QTc of 481, no acute ischemic changes noted.  Bilateral LE [place]: IMPRESSION:  Positive exam for presence of deep venous thrombosis in the RIGHT  femoral and popliteal veins extending into RIGHT calf.    A small portion of the thrombus in the RIGHT popliteal vein is  slightly more echoic and age-indeterminate, with remainder  hypoechoic question acute.    No evidence of deep venous thrombosis in the LEFT lower extremity.      His initial troponin was elevated to 123, repeat troponin uptrending to 359.  My concern is for DVT and PE with right heart strain, also considered ACS.  Lower concern for aortic dissection, pulses intact bilaterally patient not complaining of ripping or tearing chest pain with no radiation to the back. The patient was started on heparin IV.  CTA PE study performed: Acute bilateral submassive PE  with marked RV dilation concerning for right heart strain. IMPRESSION:  1. Bilateral LEFT and RIGHT pulmonary arterial emboli which extend  into all lobar and segmental branches on the RIGHT with occlusive  thrombosis in RIGHT lower lobe pulmonary artery. Extensive LEFT  pulmonary artery emboli extending into segmental and lobar level  branches as well with occlusive thrombus in segmental branches of  the LEFT lower lobe. Positive for acute PE with CT evidence of  marked right heart strain (RV/LV Ratio = 2.12) consistent with at  least submassive (intermediate risk) PE. The presence of right heart  strain has been associated with an increased risk of morbidity and  mortality. Please refer to the "Code PE Focused" order set in EPIC.  2. 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  3. Interval loss of height at T11 at the site of previous  compression fracture. Correlate with any new or recent worsening of.  Symptoms in this area    Findings of pulmonary emboli and RIGHT heart strain were related to  the provider is outlined below.    Critical Value/emergent results were called by telephone at the time  of interpretation on 12/24/2021 at 12:35 pm to provider Regan Lemming  , who verbally acknowledged these results.    Aortic Atherosclerosis (ICD10-I70.0).   I spoke with Dr. Erskine Emery of pulmonary critical care at [facility] who accepted the patient in admission for further management.  At time of admission, the patient was hemodynamically stable, BP 120/86, tachycardic P1 13, afebrile, mildly tachypneic, saturating well on 4 L O2 via nasal cannula.  Patient was subsequently admitted in critical condition.  I updated the patient on the plan of care bedside.   Final Clinical Impression(s) / ED Diagnoses Final diagnoses:  Acute respiratory failure with hypoxia (HCC)  Elevated troponin  Acute deep vein thrombosis (DVT) of proximal vein of right lower extremity (HCC)  Acute pulmonary  embolism with acute cor pulmonale, unspecified pulmonary embolism type ([place])    Rx / DC Orders ED  Discharge Orders     None         Regan Lemming, MD 12/24/21 1256

## 2021-12-24 NOTE — Procedures (Signed)
Interventional Radiology Procedure Note  Procedure: Initiation of bilateral PE thrombolysis  Complications: None  Estimated Blood Loss: None  Recommendations: - tPA at 1 mg/hr per catheter x 12 hrs (total dose 24 mg) - Bedrest overnight - Clear liquids only   Signed,  Criselda Peaches, MD

## 2021-12-24 NOTE — Progress Notes (Signed)
[facility] Progress Note Patient Name: David Armstrong DOB: December 02, 1950 MRN: [id number]   Date of Service  12/24/2021  HPI/Events of Note  Patient asking for a sleep aid.  eICU Interventions  PRN Melatonin ordered.        Frederik Pear 12/24/2021, 8:52 PM

## 2021-12-24 NOTE — ED Notes (Signed)
Md turned Pt up to 4L [place]

## 2021-12-25 ENCOUNTER — Telehealth (HOSPITAL_COMMUNITY): Payer: Self-pay | Admitting: Pharmacy Technician

## 2021-12-25 ENCOUNTER — Inpatient Hospital Stay (HOSPITAL_COMMUNITY): Payer: Medicare Other

## 2021-12-25 ENCOUNTER — Other Ambulatory Visit: Payer: Medicare Other

## 2021-12-25 ENCOUNTER — Other Ambulatory Visit (HOSPITAL_COMMUNITY): Payer: Self-pay | Admitting: Interventional Radiology

## 2021-12-25 ENCOUNTER — Other Ambulatory Visit (HOSPITAL_COMMUNITY): Payer: Self-pay

## 2021-12-25 DIAGNOSIS — I2602 Saddle embolus of pulmonary artery with acute cor pulmonale: Secondary | ICD-10-CM | POA: Diagnosis not present

## 2021-12-25 DIAGNOSIS — J9601 Acute respiratory failure with hypoxia: Secondary | ICD-10-CM | POA: Diagnosis not present

## 2021-12-25 DIAGNOSIS — I2694 Multiple subsegmental pulmonary emboli without acute cor pulmonale: Secondary | ICD-10-CM | POA: Diagnosis not present

## 2021-12-25 DIAGNOSIS — R778 Other specified abnormalities of plasma proteins: Secondary | ICD-10-CM | POA: Diagnosis not present

## 2021-12-25 HISTORY — PX: IR THROMB F/U EVAL ART/VEN FINAL DAY (MS): IMG5379

## 2021-12-25 LAB — CBC
HCT: 36.3 % — ABNORMAL LOW (ref 39.0–52.0)
HCT: 34.9 % — ABNORMAL LOW (ref 39.0–52.0)
HCT: 34.5 % — ABNORMAL LOW (ref 39.0–52.0)
Hemoglobin: 12.5 g/dL — ABNORMAL LOW (ref 13.0–17.0)
Hemoglobin: 12.1 g/dL — ABNORMAL LOW (ref 13.0–17.0)
Hemoglobin: 11.9 g/dL — ABNORMAL LOW (ref 13.0–17.0)
MCH: 31.3 pg (ref 26.0–34.0)
MCH: 30.9 pg (ref 26.0–34.0)
MCH: 30.9 pg (ref 26.0–34.0)
MCHC: 34.4 g/dL (ref 30.0–36.0)
MCHC: 34.7 g/dL (ref 30.0–36.0)
MCHC: 34.5 g/dL (ref 30.0–36.0)
MCV: 90.8 fL (ref 80.0–100.0)
MCV: 89.3 fL (ref 80.0–100.0)
MCV: 89.6 fL (ref 80.0–100.0)
Platelets: 124 10*3/uL — ABNORMAL LOW (ref 150–400)
Platelets: 110 10*3/uL — ABNORMAL LOW (ref 150–400)
Platelets: 110 10*3/uL — ABNORMAL LOW (ref 150–400)
RBC: 4 MIL/uL — ABNORMAL LOW (ref 4.22–5.81)
RBC: 3.91 MIL/uL — ABNORMAL LOW (ref 4.22–5.81)
RBC: 3.85 MIL/uL — ABNORMAL LOW (ref 4.22–5.81)
RDW: 14.5 % (ref 11.5–15.5)
RDW: 14.3 % (ref 11.5–15.5)
RDW: 14.2 % (ref 11.5–15.5)
WBC: 8 10*3/uL (ref 4.0–10.5)
WBC: 8.4 10*3/uL (ref 4.0–10.5)
WBC: 8.9 10*3/uL (ref 4.0–10.5)
nRBC: 0 % (ref 0.0–0.2)
nRBC: 0 % (ref 0.0–0.2)
nRBC: 0 % (ref 0.0–0.2)

## 2021-12-25 LAB — COMPREHENSIVE METABOLIC PANEL
ALT: 21 U/L (ref 0–44)
AST: 25 U/L (ref 15–41)
Albumin: 2.4 g/dL — ABNORMAL LOW (ref 3.5–5.0)
Alkaline Phosphatase: 68 U/L (ref 38–126)
Anion gap: 8 (ref 5–15)
BUN: 13 mg/dL (ref 8–23)
CO2: 22 mmol/L (ref 22–32)
Calcium: 8 mg/dL — ABNORMAL LOW (ref 8.9–10.3)
Chloride: 110 mmol/L (ref 98–111)
Creatinine, Ser: 0.7 mg/dL (ref 0.61–1.24)
GFR, Estimated: >60 mL/min (ref >60)
Glucose, Bld: 115 mg/dL — ABNORMAL HIGH (ref 70–99)
Potassium: 4.8 mmol/L (ref 3.5–5.1)
Sodium: 140 mmol/L (ref 135–145)
Total Bilirubin: 0.9 mg/dL (ref 0.3–1.2)
Total Protein: 5.2 g/dL — ABNORMAL LOW (ref 6.5–8.1)

## 2021-12-25 LAB — FIBRINOGEN
Fibrinogen: 569 mg/dL — ABNORMAL HIGH (ref 210–475)
Fibrinogen: 548 mg/dL — ABNORMAL HIGH (ref 210–475)
Fibrinogen: 524 mg/dL — ABNORMAL HIGH (ref 210–475)

## 2021-12-25 LAB — HEPARIN LEVEL (UNFRACTIONATED)
Heparin Unfractionated: 0.39 IU/mL (ref 0.30–0.70)
Heparin Unfractionated: 0.26 IU/mL — ABNORMAL LOW (ref 0.30–0.70)
Heparin Unfractionated: 0.38 IU/mL (ref 0.30–0.70)

## 2021-12-25 LAB — ECHOCARDIOGRAM COMPLETE
Area-P 1/2: 4.06 cm2
Height: 73 in
S' Lateral: 3 cm
Weight: 2512.01 oz

## 2021-12-25 LAB — MRSA NEXT GEN BY PCR, NASAL: MRSA by PCR Next Gen: NOT DETECTED

## 2021-12-25 LAB — MAGNESIUM: Magnesium: 2.2 mg/dL (ref 1.7–2.4)

## 2021-12-25 NOTE — Progress Notes (Signed)
NAME:  David Armstrong, MRN:  [id number], DOB:  Jul 28, 1950, LOS: 1 ADMISSION DATE:  12/24/2021, CONSULTATION DATE:  9/12 REFERRING MD:  Armandina Gemma, CHIEF COMPLAINT:  SOB   History of Present Illness:  David Armstrong, is a 71 y.o. male, who presented to the [facility] with a chief complaint of SOB  He has a pertinent past medical history of DVT/Bilateral PE (2017).  Per notes he was prescribed Eliquis but reported that he has not been taking the medication for the past few months.  He developed shortness of breath that woke him up on 9/12.  Endorses pain and cramping in right lower extremity.  Also endorses productive cough.  He presented to [facility].  CTA chest notable for bilateral left and right pulmonary embolus extending into all lobes and segmental branches on the right, with occlusive thrombus in right lower lobe pulmonary artery.  Extensive left pulmonary emboli emboli extending into the segmental and lobar level branches with occlusive thrombus in segmental branches of left lower lobe.  Per radiology RV/LV ratio 2.12.  Left lower lobe suspicious for pulmonary infarction. Dopplers positive for DVT right femoral and popliteal veins extending into right calf, negative left DVT. While in the ED he became hypoxic requiring 4LNC, HR 111-121, normotensive. Afebrile.   PCCM was consulted for admission  Pertinent  Medical History  DVT/Bilateral PE (2017), GERD, IBS, peripheral neuropathy, HLD, nephrolithiasis, lumbar disk disease, TMJ,Depression/Anxiety, diverticulosis  Significant Hospital Events: Including procedures, antibiotic start and stop dates in addition to other pertinent events   9/12 Presented to [facility], CTA> BL PE, Venous duplex> Fem and popliteal DVT on RT, heparin GTT, PCCM consult.  Interim History / Subjective:  Patient had catheter directed thrombolysis by IR This morning stated his breathing is much improved, heart rate improved as well  Objective   Blood pressure 103/74, pulse 81,  temperature (!) 97.4 F (36.3 C), temperature source Oral, resp. rate 20, height '6\' 1"'$  (1.854 m), weight 71.2 kg, SpO2 99 %. On 4LNC        Intake/Output Summary (Last 24 hours) at 12/25/2021 1109 Last data filed at 12/25/2021 0800 Gross per 24 hour  Intake 351.49 ml  Output 700 ml  Net -348.51 ml   Filed Weights   12/24/21 0716  Weight: 71.2 kg    Physical exam: General: Elderly male, lying on the bed HEENT: [place]/AT, eyes anicteric.  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  Reports not taking Xarelto 2 recently.  Ultrasound positive for right femoral popliteal DVT.  CTA 9/12 positive for bilateral PE and LLL pulmonary infarction Desats in ED to 88%. Placed on 4L O2. S PESI score 1, high risk. Now stated feeling much improved Underwent catheter directed thrombolysis by IR Currently on room air Patient will need lifelong anticoagulation Currently on IV heparin infusion, switch to DOAC before discharge Echocardiogram is pending  Demand cardiac ischemia In the setting of submassive PE Trend troponin Follow-up echocardiogram  Thrombocytopenia Patient platelet count remain low Suspect secondary to multiple thrombosis  Closely monitor platelet count  Best Practice (right click and "Reselect all SmartList Selections" daily)   Diet/type: Regular diet DVT prophylaxis: systemic heparin GI prophylaxis: PPI Lines: N/A Foley:  N/A Code Status:  full code Last  date of multidisciplinary goals of care discussion [Full scope- would not want to be on a ventilator if he was "brain dead" or a "vegetable"]  Labs   CBC: Recent Labs  Lab 12/24/21 0738 12/25/21 0039 12/25/21 0955  WBC 10.5 8.0 8.4  NEUTROABS 9.1*  --   --   HGB 14.4 12.5* 12.1*  HCT 42.9 36.3* 34.9*  MCV 90.9 90.8 89.3  PLT 134* 124* 110*    Basic Metabolic Panel: Recent Labs  Lab 12/24/21 0738 12/25/21 0039  NA 139 140  K 3.7 4.8  CL 104 110  CO2 23 22  GLUCOSE 142* 115*  BUN 18 13  CREATININE 0.70 0.70  CALCIUM 8.8* 8.0*  MG  --  2.2   GFR: Estimated Creatinine Clearance: 86.5 mL/min (by C-G formula based on SCr of 0.7 mg/dL). Recent Labs  Lab 12/24/21 0738 12/24/21 1303 12/25/21 0039 12/25/21 0955  WBC 10.5  --  8.0 8.4  LATICACIDVEN  --  1.1  --   --     Liver Function Tests: Recent Labs  Lab 12/24/21 0738 12/25/21 0039  AST 23 25  ALT 23 21  ALKPHOS 90 68  BILITOT 1.6* 0.9  PROT 6.9 5.2*  ALBUMIN 3.9 2.4*   No results for input(s): "LIPASE", "AMYLASE" in the last 168 hours. No results for input(s): "AMMONIA" in the last 168 hours.  ABG No results found for: "PHART", "PCO2ART", "PO2ART", "HCO3", "TCO2", "ACIDBASEDEF", "O2SAT"   Coagulation Profile: Recent Labs  Lab 12/24/21 1303  INR 1.3*    Cardiac Enzymes: No results for input(s): "CKTOTAL", "CKMB", "CKMBINDEX", "TROPONINI" in the last 168 hours.  HbA1C: Hgb A1c MFr Bld  Date/Time Value Ref Range Status  08/20/2020 11:37 AM 5.6 4.6 - 6.5 % Final    Comment:    Glycemic Control Guidelines for People with Diabetes:Non Diabetic:  <6%Goal of Therapy: <7%Additional Action Suggested:  >8%     CBG: No results for input(s): "GLUCAP" in the last 168 hours.   Jacky Kindle, MD [place] [facility] See Amion for pager If no response to pager, please call [phone] until 7pm After 7pm, Please call E-link [phone]

## 2021-12-25 NOTE — Progress Notes (Signed)
ANTICOAGULATION CONSULT NOTE   Pharmacy Consult for heparin Indication: pulmonary embolus/DVT  Allergies  Allergen Reactions   Effexor Xr [Venlafaxine Hcl Er] Other (See Comments)    Insomnia   Acyclovir And Related    Zocor [Simvastatin] Other (See Comments)    Muscle ache/ pain   Dexilant [Dexlansoprazole] Other (See Comments)    Muscle aches   Ivp Dye [Iodinated Contrast Media] Rash   Soma [Carisoprodol] Other (See Comments)    Sores on arm   Sulfa Antibiotics Rash    Patient Measurements: Height: '6\' 1"'$  (185.4 cm) Weight: 71.2 kg (157 lb) IBW/kg (Calculated) : 79.9 Heparin Dosing Weight: 71.2 kg  Vital Signs: Temp: 97.9 F (36.6 C) (09/12 2322) Temp Source: Axillary (09/12 2322) BP: 98/72 (09/13 0000) Pulse Rate: 84 (09/13 0000)  Labs: Recent Labs    12/24/21 0738 12/24/21 0944 12/24/21 1127 12/24/21 1303 12/24/21 1340 12/24/21 1410 12/24/21 1723 12/25/21 0039  HGB 14.4  --   --   --   --   --   --  12.5*  HCT 42.9  --   --   --   --   --   --  36.3*  PLT 134*  --   --   --   --   --   --  124*  APTT  --   --   --  >200*  --  106*  --   --   LABPROT  --   --   --  16.4*  --   --   --   --   INR  --   --   --  1.3*  --   --   --   --   HEPARINUNFRC  --   --   --  >1.10*  --   --  0.37 0.39  CREATININE 0.70  --   --   --   --   --   --  0.70  TROPONINIHS  --  359* 355*  --  307*  --   --   --      Estimated Creatinine Clearance: 86.5 mL/min (by C-G formula based on SCr of 0.7 mg/dL).   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 Previously was taking xarelto but has not been taking it - per patient report he says last time he took Xarelto was probably in May 2023 due to high pill burden at home (last fill 07/31/21 in Epic dispense records).   + for DVT in R fem/popliteal vein extending into calf on doppler. CT chest shows b/l PE with RHS. Transferred to [facility] and to IR for PE lysis. Pt on tPA '1mg'$ /hr  per catheter x 12 hours (total dose 24 mg).  Heparin level remains therapeutic, CBC and fibrinogen stable.  Goal of Therapy:  Heparin level 0.3-0.7 units/ml Monitor platelets by anticoagulation protocol: Yes   Plan:  Continue heparin infusion at 1200 units/hr F/u 6hr heparin level, CBC. fibrinogen every 6 hours per protocol (notify MD if <150)  Arrie Senate, PharmD, BCPS, [facility] Clinical Pharmacist Please check AMION for all [facility] Pharmacy numbers 12/25/2021

## 2021-12-25 NOTE — Telephone Encounter (Signed)
Pharmacy Patient Advocate Encounter  Insurance verification completed.    The patient is insured through [employer]   The patient is currently admitted and ran test claims for the following: Eliquis, Xarelto.  Copays and coinsurance results were relayed to Inpatient clinical team.

## 2021-12-25 NOTE — Progress Notes (Addendum)
[place] for heparin Indication: pulmonary embolus/DVT  Allergies  Allergen Reactions   Effexor Xr [Venlafaxine Hcl Er] Other (See Comments)    Insomnia   Acyclovir And Related    Zocor [Simvastatin] Other (See Comments)    Muscle ache/ pain   Dexilant [Dexlansoprazole] Other (See Comments)    Muscle aches   Ivp Dye [Iodinated Contrast Media] Rash   Soma [Carisoprodol] Other (See Comments)    Sores on arm   Sulfa Antibiotics Rash    Patient Measurements: Height: '6\' 1"'$  (185.4 cm) Weight: 71.2 kg (157 lb) IBW/kg (Calculated) : 79.9 Heparin Dosing Weight: 71.2 kg  Vital Signs: Temp: 97.4 F (36.3 C) (09/13 0700) Temp Source: Oral (09/13 0700) BP: 103/74 (09/13 0800) Pulse Rate: 81 (09/13 0800)  Labs: Recent Labs    12/24/21 0738 12/24/21 0944 12/24/21 1127 12/24/21 1303 12/24/21 1340 12/24/21 1410 12/24/21 1723 12/25/21 0039  HGB 14.4  --   --   --   --   --   --  12.5*  HCT 42.9  --   --   --   --   --   --  36.3*  PLT 134*  --   --   --   --   --   --  124*  APTT  --   --   --  >200*  --  106*  --   --   LABPROT  --   --   --  16.4*  --   --   --   --   INR  --   --   --  1.3*  --   --   --   --   HEPARINUNFRC  --   --   --  >1.10*  --   --  0.37 0.39  CREATININE 0.70  --   --   --   --   --   --  0.70  TROPONINIHS  --  359* 355*  --  307*  --   --   --      Estimated Creatinine Clearance: 86.5 mL/min (by C-G formula based on SCr of 0.7 mg/dL).   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 Previously was taking xarelto but has not been taking it - per patient report he says last time he took Xarelto was probably in May 2023 due to high pill burden at home (last fill 07/31/21 in Epic dispense records).   + for DVT in R fem/popliteal vein extending into calf on doppler. CT chest shows b/l PE with RHS. Transferred to [facility] and to IR for PE lysis. Pt has completed 12  hours of tPA.   Heparin level remains at goal at 0.38, CBC and fibrinogen stable.  Goal of Therapy:  Heparin level 0.3-0.7 units/ml Monitor platelets by anticoagulation protocol: Yes   Plan:  Continue heparin infusion at 1200 units/hr Follow up heparin level in am  Erin Hearing PharmD., BCPS Clinical Pharmacist 12/25/2021 8:16 AM

## 2021-12-25 NOTE — TOC Benefit Eligibility Note (Signed)
Patient Teacher, English as a foreign language completed.    The patient is currently admitted and upon discharge could be taking Eliquis 5 mg.  The current 30 day co-pay is $135.35.   The patient is currently admitted and upon discharge could be taking Xarelto 20 mg.  The current 30 day co-pay is $131.03.   The patient is insured through [place], [place] Patient Advocate Specialist [place] Patient Advocate Team Direct Number: [phone]  Fax: [phone]

## 2021-12-25 NOTE — Progress Notes (Signed)
Referring Physician(s): Dr Jeanella Craze  Supervising Physician: Jacqulynn Cadet  Patient Status:  [facility] - In-pt  Chief Complaint:  SOB CTA revealing Bilat PE with Rt heart strain  Subjective:  Bilateral PE thrombolysis in IR 12/25/21 Feeling better this am Less difficult to breathe 02 sat 98% RA   Allergies: Effexor xr [venlafaxine hcl er], Acyclovir and related, Zocor [simvastatin], Dexilant [dexlansoprazole], Ivp dye [iodinated contrast media], Soma [carisoprodol], and Sulfa antibiotics  Medications: Prior to Admission medications   Medication Sig Start Date End Date Taking? Authorizing Provider  desonide (DESOWEN) 0.05 % lotion APPLY TOPICALLY TO THE AFFECTED AREA EVERY DAY AS NEEDED 05/31/21  Yes Vivi Barrack, MD  fluticasone [facility]) 50 MCG/ACT nasal spray Place 2 sprays into both nostrils daily. USE twice a day for 1 week, then reduce to using daily. 12/21/21  Yes Vivi Barrack, MD  hyoscyamine (LEVSIN SL) 0.125 MG SL tablet Take 2 tablets (0.25 mg total) by mouth every 6 (six) hours as needed. DISSOLVE 1 TABLET(0.125 MG) UNDER THE TONGUE EVERY 6 HOURS AS NEEDED 12/20/21  Yes Vivi Barrack, MD  ibandronate (BONIVA) 150 MG tablet Take 150 mg by mouth every 30 (thirty) days. 12/05/21  Yes [provider]  LORazepam (ATIVAN) 1 MG tablet Take 0.5-1 tablets (0.5-1 mg total) by mouth every 8 (eight) hours as needed for anxiety. 05/26/17  Yes Vivi Barrack, MD  omeprazole (PRILOSEC) 20 MG capsule Take 1 capsule (20 mg total) by mouth daily. 12/20/21  Yes Vivi Barrack, MD  rivaroxaban (XARELTO) 20 MG TABS tablet Take 1 tablet (20 mg total) by mouth daily with supper. Start 20 mg daily tablets after completion of 3 weeks of 15 mg BID dosing. 02/10/20  Yes Inda Coke, PA  triamcinolone cream (KENALOG) 0.1 % Apply topically 2 (two) times daily as needed. 10/17/21  Yes [provider]  betamethasone dipropionate 0.05 % cream Apply topically 2 (two) times  daily. Patient not taking: Reported on 12/24/2021 10/10/20   Vivi Barrack, MD  calcitonin, salmon, (MIACALCIN) 200 UNIT/ACT nasal spray Place 1 spray into alternate nostrils daily. Patient not taking: Reported on 12/24/2021 06/12/21   [name], DO  Elastic Bandages & Supports ([place]) MISC Use daily. 07/13/20   Vivi Barrack, MD  furosemide (LASIX) 20 MG tablet Take 1 tablet (20 mg total) by mouth daily as needed. Patient not taking: Reported on 12/24/2021 06/17/21   Vivi Barrack, MD  linaclotide [facility]) 145 MCG CAPS capsule Take 1 capsule (145 mcg total) by mouth daily before breakfast. Patient not taking: Reported on 12/24/2021 07/12/21   Vivi Barrack, MD  potassium chloride (KLOR-CON M) 10 MEQ tablet Take 1 tablet (10 mEq total) by mouth daily. Patient not taking: Reported on 12/24/2021 07/02/21   Vivi Barrack, MD  tiZANidine (ZANAFLEX) 4 MG tablet Take 1 tablet (4 mg total) by mouth every 6 (six) hours as needed for muscle spasms. Patient not taking: Reported on 12/24/2021 04/25/21   Vivi Barrack, MD  traMADol (ULTRAM) 50 MG tablet Take 50 mg by mouth 3 (three) times daily as needed. 10/23/21   [provider]     Vital Signs: BP 103/74 (BP Location: Right Arm)   Pulse 81   Temp (!) 97.4 F (36.3 C) (Oral)   Resp 20   Ht '6\' 1"'$  (1.854 m)   Wt 157 lb (71.2 kg)   SpO2 99%   BMI 20.71 kg/m   Physical Exam  Vitals reviewed.  Pulmonary:     Effort: Pulmonary effort is normal.     Breath sounds: Normal breath sounds. No wheezing.  Skin:    General: Skin is warm.     Comments: Rt groin NT no bleeding No hematoma  Neurological:     Mental Status: He is alert and oriented to person, place, and time.  Psychiatric:        Behavior: Behavior normal.     Imaging: CT Angio Chest PE W and/or Wo Contrast  Result Date: 12/24/2021 CLINICAL DATA:  Suspected pulmonary embolism in a 71 year old male. EXAM: CT ANGIOGRAPHY CHEST WITH CONTRAST  TECHNIQUE: Multidetector CT imaging of the chest was performed using the standard protocol during bolus administration of intravenous contrast. Multiplanar CT image reconstructions and MIPs were obtained to evaluate the vascular anatomy. RADIATION DOSE REDUCTION: This exam was performed according to the departmental dose-optimization program which includes automated exposure control, adjustment of the mA and/or kV according to patient size and/or use of iterative reconstruction technique. CONTRAST:  24m OMNIPAQUE IOHEXOL 350 MG/ML SOLN COMPARISON:  Chest CT of February 2019. FINDINGS: Cardiovascular: Main pulmonary artery is opacified to 552 Hounsfield units. There are bilateral LEFT and RIGHT pulmonary arterial emboli which extend into all segmental branches on the RIGHT with occlusive thrombotic changes in RIGHT lower lobe pulmonary artery. Pulmonary emboli also extending throughout the LEFT lobar and segmental branches with occlusive thrombus in segmental branch to the LEFT lower lobe laterally resulting in a focus of airspace disease compatible with pulmonary infarct. Elevated RV to LV ratio to 2.12. Marked RIGHT heart enlargement in the setting of RIGHT heart strain. No pericardial effusion or signs of pericardial nodularity. Aortic caliber is normal. Aorta not well assessed due to phase of contrast enhancement, maximize for evaluation of pulmonary vascular bed. Mediastinum/Nodes: No thoracic inlet, axillary, mediastinal or hilar adenopathy. Esophagus grossly normal. Lungs/Pleura: Patchy ground-glass opacities in addition to signs of pulmonary infarct the in the LEFT lower lobe. No lobar consolidative process. No pneumothorax. No pleural effusion. Airways are patent. Upper Abdomen: Incidental imaging of upper abdominal contents shows no acute process related to visualized portions the liver, gallbladder, pancreas, spleen, adrenal glands or kidneys. Nephrolithiasis in the upper pole the LEFT kidney partially  visualized, at least 5 mm calculus in the upper pole. No upper abdominal lymphadenopathy. Musculoskeletal: Redemonstration of compression fractures at T11 and T12. Slight increased loss of height at T11 with slightly greater than 50% loss of height at this level. Post cement augmentation at T12 as before. Loss of height at L1 is unchanged. Review of the MIP images confirms the above findings. IMPRESSION: 1. Bilateral LEFT and RIGHT pulmonary arterial emboli which extend into all lobar and segmental branches on the RIGHT with occlusive thrombosis in RIGHT lower lobe pulmonary artery. Extensive LEFT pulmonary artery emboli extending into segmental and lobar level branches as well with occlusive thrombus in segmental branches of the LEFT lower lobe. Positive for acute PE with CT evidence of marked right heart strain (RV/LV Ratio = 2.12) consistent with at least submassive (intermediate risk) PE. The presence of right heart strain has been associated with an increased risk of morbidity and mortality. Please refer to the "Code PE Focused" order set in EPIC. 2. 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 3. Interval loss of height at T11 at the site of previous compression fracture. Correlate with any new or recent worsening of. Symptoms in this area Findings of pulmonary emboli and RIGHT heart strain  were related to the provider is outlined below. Critical Value/emergent results were called by telephone at the time of interpretation on 12/24/2021 at 12:35 pm to provider Regan Lemming , who verbally acknowledged these results. Aortic Atherosclerosis (ICD10-I70.0). Electronically Signed   By: Zetta Bills M.D.   On: 12/24/2021 12:50   US Venous Img Lower Bilateral  Result Date: 12/24/2021 CLINICAL DATA:  BILATERAL leg swelling RIGHT greater than LEFT, history of DVT last summer EXAM: BILATERAL LOWER EXTREMITY VENOUS DOPPLER ULTRASOUND TECHNIQUE: Gray-scale sonography with graded compression, as well as color  Doppler and duplex ultrasound were performed to evaluate the lower extremity deep venous systems from the level of the common femoral vein and including the common femoral, femoral, profunda femoral, popliteal and calf veins including the posterior tibial, peroneal and gastrocnemius veins when visible. The superficial great saphenous vein was also interrogated. Spectral Doppler was utilized to evaluate flow at rest and with distal augmentation maneuvers in the common femoral, femoral and popliteal veins. COMPARISON:  11/11/2016 FINDINGS: RIGHT LOWER EXTREMITY Common Femoral Vein: No evidence of thrombus. Normal compressibility, respiratory phasicity and response to augmentation. Saphenofemoral Junction: No evidence of thrombus. Normal compressibility and flow on color Doppler imaging. Profunda Femoral Vein: No evidence of thrombus. Normal compressibility and flow on color Doppler imaging. Femoral Vein: Hypoechoic intraluminal thrombus present. Impaired spontaneous venous flow and compressibility Popliteal Vein: Hypoechoic intraluminal thrombus present. Small portion of the thrombus in the RIGHT popliteal vein is slightly more echoic and age indeterminate. Impaired spontaneous venous flow and compressibility. Calf Veins: Intraluminal thrombus with impaired spontaneous venous flow and compressibility Superficial Great Saphenous Vein: No evidence of thrombus. Normal compressibility. Venous Reflux:  None. Other Findings:  None. LEFT LOWER EXTREMITY Common Femoral Vein: No evidence of thrombus. Normal compressibility, respiratory phasicity and response to augmentation. Saphenofemoral Junction: No evidence of thrombus. Normal compressibility and flow on color Doppler imaging. Profunda Femoral Vein: No evidence of thrombus. Normal compressibility and flow on color Doppler imaging. Femoral Vein: No evidence of thrombus. Normal compressibility, respiratory phasicity and response to augmentation. Popliteal Vein: No evidence  of thrombus. Normal compressibility, respiratory phasicity and response to augmentation. Calf Veins: No evidence of thrombus. Normal compressibility and flow on color Doppler imaging. Superficial Great Saphenous Vein: No evidence of thrombus. Normal compressibility. Venous Reflux:  None. Other Findings:  None. IMPRESSION: Positive exam for presence of deep venous thrombosis in the RIGHT femoral and popliteal veins extending into RIGHT calf. A small portion of the thrombus in the RIGHT popliteal vein is slightly more echoic and age-indeterminate, with remainder hypoechoic question acute. No evidence of deep venous thrombosis in the LEFT lower extremity. Electronically Signed   By: Lavonia Dana M.D.   On: 12/24/2021 08:49   DG Chest 2 View  Result Date: 12/24/2021 CLINICAL DATA:  Shortness of breath. Productive cough. Concern for pneumonia. EXAM: CHEST - 2 VIEW COMPARISON:  AP chest 12/12/2021, chest two views 07/03/2017; CT chest 05/23/2017 FINDINGS: Cardiac silhouette at the upper limits of normal size for AP technique. Limited evaluation of mediastinal contours by low lung volumes and AP technique. Mildly decreased lung volumes. Linear and heterogeneous airspace opacification of the left lower lung. The right lung is clear. No definite pleural effusion. No pneumothorax. Mild multilevel degenerative disc changes of the thoracic spine. Vertebral augmentation cement at the approximate T12 vertebral body level. Moderate anterior approximate T11, mild anterior approximate T12, and minimal anterior approximate L1 vertebral body height loss. IMPRESSION: 1. Mildly decreased lung volumes with left basilar airspace  opacification may represent atelectasis versus pneumonia. 2. Mild anterior height loss of multiple lower thoracic and upper lumbar vertebral bodies. Electronically Signed   By: Yvonne Kendall M.D.   On: 12/24/2021 08:14    Labs:  CBC: Recent Labs    12/12/21 1000 12/24/21 0738 12/25/21 0039  12/25/21 0955  WBC 10.1 10.5 8.0 8.4  HGB 14.6 14.4 12.5* 12.1*  HCT 42.9 42.9 36.3* 34.9*  PLT 158 134* 124* 110*    COAGS: Recent Labs    12/24/21 1303 12/24/21 1410  INR 1.3*  --   APTT >200* 106*    BMP: Recent Labs    07/23/21 1148 10/30/21 1205 12/12/21 1000 12/24/21 0738 12/25/21 0039  NA 141 143 136 139 140  K 3.6 4.9 3.9 3.7 4.8  CL 106 107 103 104 110  CO2 '29 26 24 23 22  '$ GLUCOSE 103* 92 119* 142* 115*  BUN '13 22 17 18 13  '$ CALCIUM 9.0 8.8 8.6* 8.8* 8.0*  CREATININE 0.80 0.86 0.76 0.70 0.70  GFRNONAA >60  --  >60 >60 >60    LIVER FUNCTION TESTS: Recent Labs    07/23/21 1148 10/30/21 1205 12/24/21 0738 12/25/21 0039  BILITOT 0.8 0.7 1.6* 0.9  AST '17 19 23 25  '$ ALT '13 17 23 21  '$ ALKPHOS 123 67 90 68  PROT 6.5 6.0 6.9 5.2*  ALBUMIN 3.9 3.8 3.9 2.4*    Assessment and Plan:  Bilat PE Thrombolysis in IR 12/25/21 Doing well  Electronically Signed: Lavonia Drafts, PA-C 12/25/2021, 11:01 AM   I spent a total of 15 Minutes at the the patient's bedside AND on the patient's hospital floor or unit, greater than 50% of which was counseling/coordinating care for PE thrombolysis

## 2021-12-25 NOTE — Progress Notes (Signed)
  Echocardiogram 2D Echocardiogram has been performed.  Johny Chess 12/25/2021, 1:27 PM

## 2021-12-26 DIAGNOSIS — I824Y1 Acute embolism and thrombosis of unspecified deep veins of right proximal lower extremity: Secondary | ICD-10-CM | POA: Diagnosis not present

## 2021-12-26 DIAGNOSIS — J9601 Acute respiratory failure with hypoxia: Secondary | ICD-10-CM | POA: Diagnosis not present

## 2021-12-26 DIAGNOSIS — I2602 Saddle embolus of pulmonary artery with acute cor pulmonale: Secondary | ICD-10-CM | POA: Diagnosis not present

## 2021-12-26 DIAGNOSIS — R778 Other specified abnormalities of plasma proteins: Secondary | ICD-10-CM | POA: Diagnosis not present

## 2021-12-26 LAB — CBC
HCT: 35 % — ABNORMAL LOW (ref 39.0–52.0)
Hemoglobin: 12.1 g/dL — ABNORMAL LOW (ref 13.0–17.0)
MCH: 30.6 pg (ref 26.0–34.0)
MCHC: 34.6 g/dL (ref 30.0–36.0)
MCV: 88.6 fL (ref 80.0–100.0)
Platelets: 132 10*3/uL — ABNORMAL LOW (ref 150–400)
RBC: 3.95 MIL/uL — ABNORMAL LOW (ref 4.22–5.81)
RDW: 14.2 % (ref 11.5–15.5)
WBC: 6.6 10*3/uL (ref 4.0–10.5)
nRBC: 0 % (ref 0.0–0.2)

## 2021-12-26 LAB — HEPARIN LEVEL (UNFRACTIONATED): Heparin Unfractionated: 0.47 IU/mL (ref 0.30–0.70)

## 2021-12-26 MED ORDER — RIVAROXABAN 20 MG PO TABS: 20.0000 mg | ORAL_TABLET | Freq: Every day | ORAL | Status: DC

## 2021-12-26 MED ORDER — RIVAROXABAN 15 MG PO TABS
15.0000 mg | ORAL_TABLET | Freq: Two times a day (BID) | ORAL | Status: DC
  Administered 2021-12-26 – 2021-12-27 (×2): 15 mg via ORAL
  Filled 2021-12-26 (×3): qty 1

## 2021-12-26 MED ORDER — PANTOPRAZOLE SODIUM 40 MG PO TBEC
40.0000 mg | DELAYED_RELEASE_TABLET | Freq: Every day | ORAL | Status: DC
  Administered 2021-12-26 – 2021-12-27 (×2): 40 mg via ORAL
  Filled 2021-12-26 (×2): qty 1

## 2021-12-26 MED ORDER — LORAZEPAM 1 MG PO TABS
1.0000 mg | ORAL_TABLET | Freq: Every evening | ORAL | Status: DC | PRN
  Administered 2021-12-26 (×2): 1 mg via ORAL
  Filled 2021-12-26 (×2): qty 1

## 2021-12-26 NOTE — Evaluation (Signed)
Physical Therapy Evaluation Patient Details Name: David Armstrong MRN: [id number] DOB: 10/30/1950 Today's Date: 12/26/2021  History of Present Illness  Pt is a 71 y.o. male admitted 12/24/21 with exertional dyspnea. Workup for acute hypoxic respiratory failure secondary to submassive PE, RLE DVT. S/p catheter-guided lytic therapy 9/12. PMH includes VTE (noncompliant with Xarelto), DVT/PE, GERD, HLD, peripheral neuropathy, bipolar disorder, arthritis, anxiety, depression.   Clinical Impression  Patient evaluated by Physical Therapy with no further acute PT needs identified. PTA, pt independent, lives alone, retired from work, enjoys walking 2-3 miles/day. Today, pt initially unstable with ambulation having not walked since admission, but strength and stability improving with ambulation distance. Educ re: activity recommendations, energy conservation strategies. All education has been completed and the patient has no further questions. Acute PT is signing off. Thank you for this referral.    Recommendations for follow up therapy are one component of a multi-disciplinary discharge planning process, led by the attending physician.  Recommendations may be updated based on patient status, additional functional criteria and insurance authorization.  Follow Up Recommendations No PT follow up      Assistance Recommended at Discharge PRN  Patient can return home with the following   N/A    Equipment Recommendations None recommended by PT  Recommendations for Other Services              Precautions / Restrictions Precautions Precautions: Fall Restrictions Weight Bearing Restrictions: No      Mobility  Bed Mobility               General bed mobility comments: received sitting in recliner    Transfers Overall transfer level: Needs assistance Equipment used: None Transfers: Sit to/from Stand Sit to Stand: Supervision                Ambulation/Gait Ambulation/Gait  assistance: Supervision Gait Distance (Feet): 390 Feet Assistive device: None Gait Pattern/deviations: Step-through pattern, Decreased stride length Gait velocity: Decreased     General Gait Details: slow, guarded gait withotu DME, initial min guard for balance progressing to supervision for safety/lines; stability improving with distance  Stairs            Wheelchair Mobility    Modified Rankin (Stroke Patients Only)       Balance Overall balance assessment: Needs assistance   Sitting balance-Leahy Scale: Good       Standing balance-Leahy Scale: Good                               Pertinent Vitals/Pain Pain Assessment Pain Assessment: No/denies pain    Home Living Family/patient expects to be discharged to:: Private residence Living Arrangements: Alone Available Help at Discharge: Family;Available PRN/intermittently Type of Home: House Home Access: Stairs to enter   [employer] of Steps: 3-6   Home Layout: One level Home Equipment: None      Prior Function Prior Level of Function : Independent/Modified Independent;Driving             Mobility Comments: Independent without DME, retired from work. Enjoys walking 2-3 miles/day       Hand Dominance        Extremity/Trunk Assessment   Upper Extremity Assessment Upper Extremity Assessment: Overall WFL for tasks assessed    Lower Extremity Assessment Lower Extremity Assessment: Overall WFL for tasks assessed    Cervical / Trunk Assessment Cervical / Trunk Assessment: Normal  Communication   Communication: No difficulties  Cognition Arousal/Alertness: Awake/alert Behavior During Therapy: WFL for tasks assessed/performed Overall Cognitive Status: Within Functional Limits for tasks assessed                                          General Comments General comments (skin integrity, edema, etc.): VSS on RA    Exercises     Assessment/Plan    PT  Assessment Patient does not need any further PT services  PT Problem List         PT Treatment Interventions      PT Goals (Current goals can be found in the Care Plan section)  Acute Rehab PT Goals PT Goal Formulation: All assessment and education complete, DC therapy    Frequency       Co-evaluation               AM-PAC PT "6 Clicks" Mobility  Outcome Measure Help needed turning from your back to your side while in a flat bed without using bedrails?: None Help needed moving from lying on your back to sitting on the side of a flat bed without using bedrails?: None Help needed moving to and from a bed to a chair (including a wheelchair)?: None Help needed standing up from a chair using your arms (e.g., wheelchair or bedside chair)?: A Little Help needed to walk in hospital room?: A Little Help needed climbing 3-5 steps with a railing? : A Little 6 Click Score: 21    End of Session Equipment Utilized During Treatment: Gait belt Activity Tolerance: Patient tolerated treatment well Patient left:  (walking another lap with RN) Nurse Communication: Mobility status PT Visit Diagnosis: Other abnormalities of gait and mobility (R26.89)    Time: 1125-1140 PT Time Calculation (min) (ACUTE ONLY): 15 min   Charges:   PT Evaluation $PT Eval Moderate Complexity: [place], PT, DPT Acute Rehabilitation Services  Personal: [place] [facility] Office: [place] 12/26/2021, 12:54 PM

## 2021-12-26 NOTE — Progress Notes (Addendum)
[place] for heparin Indication: pulmonary embolus/DVT  Allergies  Allergen Reactions   Effexor Xr [Venlafaxine Hcl Er] Other (See Comments)    Insomnia   Acyclovir And Related    Zocor [Simvastatin] Other (See Comments)    Muscle ache/ pain   Dexilant [Dexlansoprazole] Other (See Comments)    Muscle aches   Ivp Dye [Iodinated Contrast Media] Rash   Soma [Carisoprodol] Other (See Comments)    Sores on arm   Sulfa Antibiotics Rash    Patient Measurements: Height: '6\' 1"'$  (185.4 cm) Weight: 71.2 kg (157 lb) IBW/kg (Calculated) : 79.9 Heparin Dosing Weight: 71.2 kg  Vital Signs: Temp: 97.9 F (36.6 C) (09/14 0400) Temp Source: Oral (09/14 0400) BP: 119/80 (09/14 0400) Pulse Rate: 70 (09/14 0600)  Labs: Recent Labs    12/24/21 0738 12/24/21 0944 12/24/21 1127 12/24/21 1303 12/24/21 1340 12/24/21 1410 12/24/21 1723 12/25/21 0039 12/25/21 0955 12/25/21 1342 12/26/21 0628  HGB 14.4  --   --   --   --   --   --  12.5* 12.1* 11.9* 12.1*  HCT 42.9  --   --   --   --   --   --  36.3* 34.9* 34.5* 35.0*  PLT 134*  --   --   --   --   --   --  124* 110* 110* 132*  APTT  --   --   --  >200*  --  106*  --   --   --   --   --   LABPROT  --   --   --  16.4*  --   --   --   --   --   --   --   INR  --   --   --  1.3*  --   --   --   --   --   --   --   HEPARINUNFRC  --   --   --  >1.10*  --   --    < > 0.39 0.26* 0.38 0.47  CREATININE 0.70  --   --   --   --   --   --  0.70  --   --   --   TROPONINIHS  --  359* 355*  --  307*  --   --   --   --   --   --    < > = values in this interval not displayed.     Estimated Creatinine Clearance: 86.5 mL/min (by C-G formula based on SCr of 0.7 mg/dL).   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 Previously was taking xarelto but has not been taking it - per patient report he says last time he took Xarelto was probably in May 2023 due to  high pill burden at home (last fill 07/31/21 in Epic dispense records).   + for DVT in R fem/popliteal vein extending into calf on doppler. CT chest shows b/l PE with RHS. Transferred to [facility] and to IR for PE lysis. Pt has completed 12 hours of tPA.   Heparin level remains at goal at 0.47, hemoglobin stable, plt low at 132 but stable. No bleeding issues noted.   Goal of Therapy:  Heparin level 0.3-0.7 units/ml Monitor platelets by anticoagulation protocol: Yes   Plan:  Continue heparin infusion at 1200 units/hr Follow up heparin  level in am  Erin Hearing PharmD., BCPS Clinical Pharmacist 12/26/2021 7:30 AM  Addendum:  Will transition to xarelto this evening. Will turn heparin off at time xarelto given.  12/26/2021 11:13 AM

## 2021-12-26 NOTE — Progress Notes (Signed)
PROGRESS NOTE        PATIENT DETAILS Name: David Armstrong Age: 71 y.o. Sex: male Date of Birth: [birth year] Admit Date: 12/24/2021 Admitting Physician Candee Furbish, MD JYN:[id number], [name], MD  Brief Summary: Patient is a 71 y.o.  male with prior history of VTE-noncompliant with Xarelto-who presented with exertional dyspnea-found to have submassive PE with acute hypoxic respiratory failure-evaluated by PCCM/IR-underwent catheter guided lytic therapy.  Upon stability-transferred to [facility] on 9/14.  Significant events: 9/12>> admit to ICU-hypoxia-submassive PE. 9/14>> transferred to [facility].  Significant studies: 9/12>> CT angio chest: Submassive PE, pulmonary infarct left lower lobe. 9/12>> DVT right femoral/popliteal vein. 9/13>> Echo: EF 13-08%, RV systolic function is normal.  Significant microbiology data: 9/12>> COVID/influenza PCR: Negative  Procedures: 9/12>> catheter guided lytic therapy.  Consults: CCM, IR  Subjective: Lying comfortably in bed-denies any chest pain or shortness of breath.  Has not yet been ambulated.  Objective: Vitals: Blood pressure (!) 119/91, pulse 90, temperature 97.7 F (36.5 C), temperature source Oral, resp. rate (!) 25, height '6\' 1"'$  (1.854 m), weight 71.2 kg, SpO2 94 %.   Exam: Gen Exam:Alert awake-not in any distress HEENT:atraumatic, normocephalic Chest: B/L clear to auscultation anteriorly CVS:S1S2 regular Abdomen:soft non tender, non distended Extremities:no edema Neurology: Non focal Skin: no rash  Pertinent Labs/Radiology:    Latest Ref Rng & Units 12/26/2021    6:28 AM 12/25/2021    1:42 PM 12/25/2021    9:55 AM  CBC  WBC 4.0 - 10.5 K/uL 6.6  8.9  8.4   Hemoglobin 13.0 - 17.0 g/dL 12.1  11.9  12.1   Hematocrit 39.0 - 52.0 % 35.0  34.5  34.9   Platelets 150 - 400 K/uL 132  110  110     Lab Results  Component Value Date   NA 140 12/25/2021   K 4.8 12/25/2021   CL 110 12/25/2021   CO2 22  12/25/2021      Assessment/Plan: Acute hypoxic respiratory failure due to submassive PE: Improved-now on room air-s/p catheter guided lytic therapy by IR-now on IV heparin.  Ambulate with therapy services-discussed with patient-he wants to do using Xarelto (has supplies at home).  Plan to stop heparin infusion later today and transition to Xarelto accordingly.  History of recurrent VTE-noncompliant with anticoagulation: Counseled extensively-he will require indefinite anticoagulation.  Thrombocytopenia: Mild-likely due to fluid consumption from submassive PE/DVT.  Improving.  Monitor periodically.  Minimally elevated troponin: Due to demand ischemia in the setting of submassive PE.  Echo stable without any wall motion abnormality.  Doubt further work-up is required.  Pressure Ulcer: Pressure Injury 12/24/21 Coccyx Mid Stage 1 -  Intact skin with non-blanchable redness of a localized area usually over a bony prominence. (Active)  12/24/21 1700  Location: Coccyx  Location Orientation: Mid  Staging: Stage 1 -  Intact skin with non-blanchable redness of a localized area usually over a bony prominence.  Wound Description (Comments):   Present on Admission: Yes  Dressing Type Foam - Lift dressing to assess site every shift 12/26/21 0800    BMI/Obesity/Underweight: Estimated body mass index is 20.71 kg/m as calculated from the following:   Height as of this encounter: '6\' 1"'$  (1.854 m).   Weight as of this encounter: 71.2 kg.   Code status:   Code Status: Prior   DVT Prophylaxis:IV heparin  Family Communication:  None at bedside   Disposition Plan: Status is: Inpatient Remains inpatient appropriate because: Submassive PE-on IV heparin-with plans to transition to [place] today-ambulate and see how he does-if clinical improvement continues-Home on 9/15.   Planned Discharge Destination:Home   Diet: Diet Order             Diet regular Room service appropriate? Yes; Fluid consistency:  Thin  Diet effective now                     Antimicrobial agents: Anti-infectives (From admission, onward)    Start     Dose/Rate Route Frequency Ordered Stop   12/24/21 0830  cefTRIAXone (ROCEPHIN) 1 g in sodium chloride 0.9 % 100 mL IVPB        1 g 200 mL/hr over 30 Minutes Intravenous  Once 12/24/21 0827 12/24/21 0939   12/24/21 0830  azithromycin (ZITHROMAX) 500 mg in sodium chloride 0.9 % 250 mL IVPB        500 mg 250 mL/hr over 60 Minutes Intravenous  Once 12/24/21 0827 12/24/21 1134        MEDICATIONS: Scheduled Meds:  Chlorhexidine Gluconate Cloth  6 each Topical Daily   pantoprazole  40 mg Oral Daily   sodium chloride flush  3 mL Intravenous Q12H   Continuous Infusions:  sodium chloride Stopped (12/24/21 1124)   sodium chloride Stopped (12/25/21 0900)   sodium chloride Stopped (12/25/21 0900)   sodium chloride     heparin 1,200 Units/hr (12/26/21 0800)   PRN Meds:.sodium chloride, sodium chloride, acetaminophen, LORazepam, melatonin, sodium chloride flush   I have personally reviewed following labs and imaging studies  LABORATORY DATA: CBC: Recent Labs  Lab 12/24/21 0738 12/25/21 0039 12/25/21 0955 12/25/21 1342 12/26/21 0628  WBC 10.5 8.0 8.4 8.9 6.6  NEUTROABS 9.1*  --   --   --   --   HGB 14.4 12.5* 12.1* 11.9* 12.1*  HCT 42.9 36.3* 34.9* 34.5* 35.0*  MCV 90.9 90.8 89.3 89.6 88.6  PLT 134* 124* 110* 110* 132*    Basic Metabolic Panel: Recent Labs  Lab 12/24/21 0738 12/25/21 0039  NA 139 140  K 3.7 4.8  CL 104 110  CO2 23 22  GLUCOSE 142* 115*  BUN 18 13  CREATININE 0.70 0.70  CALCIUM 8.8* 8.0*  MG  --  2.2    GFR: Estimated Creatinine Clearance: 86.5 mL/min (by C-G formula based on SCr of 0.7 mg/dL).  Liver Function Tests: Recent Labs  Lab 12/24/21 0738 12/25/21 0039  AST 23 25  ALT 23 21  ALKPHOS 90 68  BILITOT 1.6* 0.9  PROT 6.9 5.2*  ALBUMIN 3.9 2.4*   No results for input(s): "LIPASE", "AMYLASE" in the last  168 hours. No results for input(s): "AMMONIA" in the last 168 hours.  Coagulation Profile: Recent Labs  Lab 12/24/21 1303  INR 1.3*    Cardiac Enzymes: No results for input(s): "CKTOTAL", "CKMB", "CKMBINDEX", "TROPONINI" in the last 168 hours.  BNP (last 3 results) No results for input(s): "PROBNP" in the last 8760 hours.  Lipid Profile: No results for input(s): "CHOL", "HDL", "LDLCALC", "TRIG", "CHOLHDL", "LDLDIRECT" in the last 72 hours.  Thyroid Function Tests: No results for input(s): "TSH", "T4TOTAL", "FREET4", "T3FREE", "THYROIDAB" in the last 72 hours.  Anemia Panel: No results for input(s): "VITAMINB12", "FOLATE", "FERRITIN", "TIBC", "IRON", "RETICCTPCT" in the last 72 hours.  Urine analysis:    Component Value Date/Time   COLORURINE YELLOW 10/30/2021 1205   APPEARANCEUR Sl Cloudy (  A) 10/30/2021 1205   LABSPEC 1.025 10/30/2021 1205   PHURINE 6.0 10/30/2021 1205   GLUCOSEU NEGATIVE 10/30/2021 1205   HGBUR LARGE (A) 10/30/2021 1205   HGBUR negative 06/02/2008 1026   BILIRUBINUR NEGATIVE 10/30/2021 1205   BILIRUBINUR Small 02/05/2018 0952   KETONESUR NEGATIVE 10/30/2021 1205   PROTEINUR TRACE (A) 07/23/2021 1330   UROBILINOGEN 0.2 10/30/2021 1205   NITRITE NEGATIVE 10/30/2021 1205   LEUKOCYTESUR NEGATIVE 10/30/2021 1205    Sepsis Labs: Lactic Acid, Venous    Component Value Date/Time   LATICACIDVEN 1.1 12/24/2021 1303    MICROBIOLOGY: Recent Results (from the past 240 hour(s))  Resp Panel by RT-PCR (Flu A&B, Covid) Anterior Nasal Swab     Status: None   Collection Time: 12/24/21  7:48 AM   Specimen: Anterior Nasal Swab  Result Value Ref Range Status   SARS Coronavirus 2 by RT PCR NEGATIVE NEGATIVE Final    Comment: (NOTE) SARS-CoV-2 target nucleic acids are NOT DETECTED.  The SARS-CoV-2 RNA is generally detectable in upper respiratory specimens during the acute phase of infection. The lowest concentration of SARS-CoV-2 viral copies this assay can  detect is 138 copies/mL. A negative result does not preclude SARS-Cov-2 infection and should not be used as the sole basis for treatment or other patient management decisions. A negative result may occur with  improper specimen collection/handling, submission of specimen other than nasopharyngeal swab, presence of viral mutation(s) within the areas targeted by this assay, and inadequate number of viral copies(<138 copies/mL). A negative result must be combined with clinical observations, patient history, and epidemiological information. The expected result is Negative.  Fact Sheet for Patients:  EntrepreneurPulse.com.au  Fact Sheet for Healthcare Providers:  IncredibleEmployment.be  This test is no t yet approved or cleared by the [place] FDA and  has been authorized for detection and/or diagnosis of SARS-CoV-2 by FDA under an Emergency Use Authorization (EUA). This EUA will remain  in effect (meaning this test can be used) for the duration of the COVID-19 declaration under Section 564(b)(1) of the Act, 21 U.S.C.section 360bbb-3(b)(1), unless the authorization is terminated  or revoked sooner.       Influenza A by PCR NEGATIVE NEGATIVE Final   Influenza B by PCR NEGATIVE NEGATIVE Final    Comment: (NOTE) The Xpert Xpress SARS-CoV-2/FLU/RSV plus assay is intended as an aid in the diagnosis of influenza from Nasopharyngeal swab specimens and should not be used as a sole basis for treatment. Nasal washings and aspirates are unacceptable for Xpert Xpress SARS-CoV-2/FLU/RSV testing.  Fact Sheet for Patients: EntrepreneurPulse.com.au  Fact Sheet for Healthcare Providers: IncredibleEmployment.be  This test is not yet approved or cleared by the [place] FDA and has been authorized for detection and/or diagnosis of SARS-CoV-2 by FDA under an Emergency Use Authorization (EUA). This EUA will remain in  effect (meaning this test can be used) for the duration of the COVID-19 declaration under Section 564(b)(1) of the Act, 21 U.S.C. section 360bbb-3(b)(1), unless the authorization is terminated or revoked.  Performed at [facility], [address], [place], [place] [address]   MRSA Next Gen by PCR, Nasal     Status: None   Collection Time: 12/25/21  8:09 PM   Specimen: Nasal Mucosa; Nasal Swab  Result Value Ref Range Status   MRSA by PCR Next Gen NOT DETECTED NOT DETECTED Final    Comment: (NOTE) The GeneXpert MRSA Assay (FDA approved for NASAL specimens only), is one component of a comprehensive MRSA colonization surveillance  program. It is not intended to diagnose MRSA infection nor to guide or monitor treatment for MRSA infections. Test performance is not FDA approved in patients less than 21 years old. Performed at [facility], [place] [address], [place], [place] [address]     RADIOLOGY STUDIES/RESULTS: ECHOCARDIOGRAM COMPLETE  Result Date: 12/25/2021    ECHOCARDIOGRAM REPORT   Patient Name:   ZAHID CARNEIRO Date of Exam: 12/25/2021 Medical Rec #:  [id number]        Height:       73.0 in Accession #:    [id number]       Weight:       157.0 lb Date of Birth:  1950/10/27        BSA:          1.941 m Patient Age:    81 years         BP:           103/74 mmHg Patient Gender: M                HR:           77 bpm. Exam Location:  Inpatient Procedure: 2D Echo Indications:    pulmonary embolus  History:        Patient has prior history of Echocardiogram examinations, most                 recent 02/13/2021. Signs/Symptoms:elevated troponin.  Sonographer:    Johny Chess RDCS Referring Phys: [id number] [place]  1. Left ventricular ejection fraction, by estimation, is 55 to 60%. The left ventricle has normal function. The left ventricle has no regional wall motion abnormalities. Left ventricular diastolic parameters are consistent with Grade I  diastolic dysfunction (impaired relaxation).  2. Right ventricular systolic function is normal. The right ventricular size is mildly enlarged. Tricuspid regurgitation signal is inadequate for assessing PA pressure.  3. Left atrial size was mildly dilated.  4. The mitral valve is normal in structure. Trivial mitral valve regurgitation. No evidence of mitral stenosis.  5. The aortic valve is tricuspid. There is mild calcification of the aortic valve. Aortic valve regurgitation is not visualized. No aortic stenosis is present.  6. Aortic dilatation noted. There is mild dilatation of the aortic root, measuring 38 mm.  7. The inferior vena cava is normal in size with greater than 50% respiratory variability, suggesting right atrial pressure of 3 mmHg. FINDINGS  Left Ventricle: Left ventricular ejection fraction, by estimation, is 55 to 60%. The left ventricle has normal function. The left ventricle has no regional wall motion abnormalities. The left ventricular internal cavity size was normal in size. There is  no left ventricular hypertrophy. Left ventricular diastolic parameters are consistent with Grade I diastolic dysfunction (impaired relaxation). Right Ventricle: The right ventricular size is mildly enlarged. No increase in right ventricular wall thickness. Right ventricular systolic function is normal. Tricuspid regurgitation signal is inadequate for assessing PA pressure. Left Atrium: Left atrial size was mildly dilated. Right Atrium: Right atrial size was normal in size. Pericardium: There is no evidence of pericardial effusion. Mitral Valve: The mitral valve is normal in structure. There is mild calcification of the mitral valve leaflet(s). Trivial mitral valve regurgitation. No evidence of mitral valve stenosis. Tricuspid Valve: The tricuspid valve is normal in structure. Tricuspid valve regurgitation is not demonstrated. Aortic Valve: The aortic valve is tricuspid. There is mild calcification of the aortic  valve. Aortic valve regurgitation is not visualized.  No aortic stenosis is present. Pulmonic Valve: The pulmonic valve was normal in structure. Pulmonic valve regurgitation is trivial. Aorta: Aortic dilatation noted. There is mild dilatation of the aortic root, measuring 38 mm. Venous: The inferior vena cava is normal in size with greater than 50% respiratory variability, suggesting right atrial pressure of 3 mmHg. IAS/Shunts: No atrial level shunt detected by color flow Doppler.  LEFT VENTRICLE PLAX 2D LVIDd:         4.80 cm   Diastology LVIDs:         3.00 cm   LV e' medial:    6.64 cm/s LV PW:         0.80 cm   LV E/e' medial:  10.9 LV IVS:        0.70 cm   LV e' lateral:   7.62 cm/s LVOT diam:     2.30 cm   LV E/e' lateral: 9.5 LV SV:         77 LV SV Index:   40 LVOT Area:     4.15 cm  RIGHT VENTRICLE             IVC RV S prime:     13.60 cm/s  IVC diam: 2.00 cm LEFT ATRIUM             Index        RIGHT ATRIUM           Index LA diam:        3.60 cm 1.85 cm/m   RA Area:     13.00 cm LA Vol (A2C):   66.2 ml 34.10 ml/m  RA Volume:   29.10 ml  14.99 ml/m LA Vol (A4C):   52.2 ml 26.89 ml/m LA Biplane Vol: 59.5 ml 30.65 ml/m  AORTIC VALVE LVOT Vmax:   96.40 cm/s LVOT Vmean:  63.600 cm/s LVOT VTI:    0.185 m  AORTA Ao Root diam: 3.80 cm Ao Asc diam:  3.20 cm MITRAL VALVE MV Area (PHT): 4.06 cm    SHUNTS MV Decel Time: 187 msec    Systemic VTI:  0.18 m MV E velocity: 72.40 cm/s  Systemic Diam: 2.30 cm MV A velocity: 79.80 cm/s MV E/A ratio:  0.91 Dalton McleanMD Electronically signed by Franki Monte Signature Date/Time: 12/25/2021/3:29:52 PM    Final    IR THROMB F/U EVAL ART/VEN FINAL DAY (MS)  Result Date: 12/25/2021 INDICATION: History of sub massive pulmonary embolism, post initiation of bilateral pulmonary arterial thrombolysis on 12/24/2021. Patient has completed prescribed course of bilateral pulmonary arterial lytic infusion and presents today for repeat pulmonary arterial pressure measurements  prior to infusion catheter removal. EXAM: PULMONARY ARTERIAL PRESSURE MEASUREMENT AND INFUSION CATHETER(S) REMOVAL COMPARISON:  Initiation of bilateral catheter directed pulmonary arterial thrombolysis-12/24/2021 Chest CTA-12/24/2021 MEDICATIONS: None CONTRAST:  None FLUOROSCOPY TIME:  None COMPLICATIONS: None immediate. TECHNIQUE: The patient was positioned supine on his hospital bed. The right pulmonary arterial catheter's infusion wire was removed and the multi side-hole infusion catheter was retracted to the estimated location of the main pulmonary artery. Pressure measurements were acquired from this location. At this point, the procedure was terminated. All wires, catheters and sheaths were removed from the patient. Hemostasis was achieved at the right groin access site with manual compression. A dressing was placed. The patient tolerated the procedure well without immediate postprocedural complication. FINDINGS: Acquired pressure measurements as follows: Preprocedural main pulmonary artery - unable to be obtained due to technical issues. Postprocedural main pulmonary artery -  22/11; mean - 15 IMPRESSION: Technically successful bilateral catheter directed pulmonary arterial thrombolysis with normal postprocedural pulmonary arterial pressure measurements following 12 hour lysis infusion. Electronically Signed   By: Sandi Mariscal M.D.   On: 12/25/2021 13:39   IR INFUSION THROMBOL ARTERIAL INITIAL (MS)  Result Date: 12/25/2021 INDICATION: 71 year old male with intermediate high risk bilateral pulmonary emboli with significant right heart strain (acute cor pulmonale). He is a candidate for bilateral pulmonary arterial thrombolysis. EXAM: IR ULTRASOUND GUIDANCE VASC ACCESS RIGHT; ADDITIONAL ARTERIOGRAPHY; IR INFUSION THROMBOL ARTERIAL INITIAL (MS); BILATERAL PULMONARY ARTERIOGRAPHY COMPARISON:  CT arteriogram 12/24/2021 MEDICATIONS: None. ANESTHESIA/SEDATION: Versed 1 mg IV; Fentanyl 50 mcg IV Moderate Sedation  Time:  43 minutes The patient's vital signs and level of consciousness were continuously monitored during the procedure by the interventional radiology nurse under my direct supervision. FLUOROSCOPY TIME:  Radiation exposure index: 69 mGy reference air kerma COMPLICATIONS: None immediate. TECHNIQUE: Informed written consent was obtained from the patient after a thorough discussion of the procedural risks, benefits and alternatives. All questions were addressed. Maximal Sterile Barrier Technique was utilized including caps, mask, sterile gowns, sterile gloves, sterile drape, hand hygiene and skin antiseptic. A timeout was performed prior to the initiation of the procedure. The right common femoral vein was interrogated with ultrasound and found to be widely patent. An image was obtained and stored for the medical record. Local anesthesia was attained by infiltration with 1% lidocaine. A small dermatotomy was made. Under real-time sonographic guidance, the vessel was punctured with a 21 gauge micropuncture needle. Using standard technique, the initial micro needle was exchanged over a 0.018 micro wire for a transitional 4 [place] micro sheath. The micro sheath was then exchanged over a 0.035 wire for a 6 French vascular sheath. Using the same technique, a second puncture of the right common femoral vein was performed and a second 6 [place] vascular sheath was placed. Working first through the more medial sheath, a 6 French angled pigtail catheter was advanced over a Bentson wire and navigated through the right heart and into the main pulmonary outflow tract. Arteriography was performed. Evidence of large volume bilateral PE with decreased perfusion to the pulmonary parenchyma. Unfortunately, at this time pressures could not be measured due to technical issues. An angled catheter was used to navigate into the left lower lobe pulmonary artery. Limited left lower lobe pulmonary arteriography was performed. There is  extensive clot burden with decreased perfusion. A rose in wire was placed. Next, the angled catheter was advanced into the right lower lobe pulmonary artery. Again, limited pulmonary arteriography was performed demonstrating large clot burden. A second rose in wire was placed. Next, 90 cm UniFuse infusion catheters were advanced. A 15 cm catheter was advanced into the right main pulmonary artery while a 10 cm infusion length catheter was advanced into the left pulmonary artery. 2 mg of tPA was then injected into each lysis catheter. The catheters were capped and connected to a tPA drip at 1 mg per hour per catheter. Both sheaths were secured to the skin with 0 silk suture. Sterile bandages were applied. The patient tolerated the procedure well. FINDINGS: Large volume bilateral pulmonary emboli. Pulmonary arterial pressures could not be obtained secondary to technical issues. IMPRESSION: Successful initiation of bilateral pulmonary arterial thrombolysis. Electronically Signed   By: Jacqulynn Cadet M.D.   On: 12/25/2021 11:37   IR US Guide Vasc Access Right  Result Date: 12/25/2021 INDICATION: 71 year old male with intermediate high risk bilateral pulmonary emboli with significant right  heart strain (acute cor pulmonale). He is a candidate for bilateral pulmonary arterial thrombolysis. EXAM: IR ULTRASOUND GUIDANCE VASC ACCESS RIGHT; ADDITIONAL ARTERIOGRAPHY; IR INFUSION THROMBOL ARTERIAL INITIAL (MS); BILATERAL PULMONARY ARTERIOGRAPHY COMPARISON:  CT arteriogram 12/24/2021 MEDICATIONS: None. ANESTHESIA/SEDATION: Versed 1 mg IV; Fentanyl 50 mcg IV Moderate Sedation Time:  43 minutes The patient's vital signs and level of consciousness were continuously monitored during the procedure by the interventional radiology nurse under my direct supervision. FLUOROSCOPY TIME:  Radiation exposure index: 69 mGy reference air kerma COMPLICATIONS: None immediate. TECHNIQUE: Informed written consent was obtained from the  patient after a thorough discussion of the procedural risks, benefits and alternatives. All questions were addressed. Maximal Sterile Barrier Technique was utilized including caps, mask, sterile gowns, sterile gloves, sterile drape, hand hygiene and skin antiseptic. A timeout was performed prior to the initiation of the procedure. The right common femoral vein was interrogated with ultrasound and found to be widely patent. An image was obtained and stored for the medical record. Local anesthesia was attained by infiltration with 1% lidocaine. A small dermatotomy was made. Under real-time sonographic guidance, the vessel was punctured with a 21 gauge micropuncture needle. Using standard technique, the initial micro needle was exchanged over a 0.018 micro wire for a transitional 4 [place] micro sheath. The micro sheath was then exchanged over a 0.035 wire for a 6 French vascular sheath. Using the same technique, a second puncture of the right common femoral vein was performed and a second 6 [place] vascular sheath was placed. Working first through the more medial sheath, a 6 French angled pigtail catheter was advanced over a Bentson wire and navigated through the right heart and into the main pulmonary outflow tract. Arteriography was performed. Evidence of large volume bilateral PE with decreased perfusion to the pulmonary parenchyma. Unfortunately, at this time pressures could not be measured due to technical issues. An angled catheter was used to navigate into the left lower lobe pulmonary artery. Limited left lower lobe pulmonary arteriography was performed. There is extensive clot burden with decreased perfusion. A rose in wire was placed. Next, the angled catheter was advanced into the right lower lobe pulmonary artery. Again, limited pulmonary arteriography was performed demonstrating large clot burden. A second rose in wire was placed. Next, 90 cm UniFuse infusion catheters were advanced. A 15 cm catheter was  advanced into the right main pulmonary artery while a 10 cm infusion length catheter was advanced into the left pulmonary artery. 2 mg of tPA was then injected into each lysis catheter. The catheters were capped and connected to a tPA drip at 1 mg per hour per catheter. Both sheaths were secured to the skin with 0 silk suture. Sterile bandages were applied. The patient tolerated the procedure well. FINDINGS: Large volume bilateral pulmonary emboli. Pulmonary arterial pressures could not be obtained secondary to technical issues. IMPRESSION: Successful initiation of bilateral pulmonary arterial thrombolysis. Electronically Signed   By: Jacqulynn Cadet M.D.   On: 12/25/2021 11:37   IR INFUSION THROMBOL ARTERIAL INITIAL (MS)  Result Date: 12/25/2021 INDICATION: 71 year old male with intermediate high risk bilateral pulmonary emboli with significant right heart strain (acute cor pulmonale). He is a candidate for bilateral pulmonary arterial thrombolysis. EXAM: IR ULTRASOUND GUIDANCE VASC ACCESS RIGHT; ADDITIONAL ARTERIOGRAPHY; IR INFUSION THROMBOL ARTERIAL INITIAL (MS); BILATERAL PULMONARY ARTERIOGRAPHY COMPARISON:  CT arteriogram 12/24/2021 MEDICATIONS: None. ANESTHESIA/SEDATION: Versed 1 mg IV; Fentanyl 50 mcg IV Moderate Sedation Time:  43 minutes The patient's vital signs and level of consciousness were continuously  monitored during the procedure by the interventional radiology nurse under my direct supervision. FLUOROSCOPY TIME:  Radiation exposure index: 69 mGy reference air kerma COMPLICATIONS: None immediate. TECHNIQUE: Informed written consent was obtained from the patient after a thorough discussion of the procedural risks, benefits and alternatives. All questions were addressed. Maximal Sterile Barrier Technique was utilized including caps, mask, sterile gowns, sterile gloves, sterile drape, hand hygiene and skin antiseptic. A timeout was performed prior to the initiation of the procedure. The right  common femoral vein was interrogated with ultrasound and found to be widely patent. An image was obtained and stored for the medical record. Local anesthesia was attained by infiltration with 1% lidocaine. A small dermatotomy was made. Under real-time sonographic guidance, the vessel was punctured with a 21 gauge micropuncture needle. Using standard technique, the initial micro needle was exchanged over a 0.018 micro wire for a transitional 4 [place] micro sheath. The micro sheath was then exchanged over a 0.035 wire for a 6 French vascular sheath. Using the same technique, a second puncture of the right common femoral vein was performed and a second 6 [place] vascular sheath was placed. Working first through the more medial sheath, a 6 French angled pigtail catheter was advanced over a Bentson wire and navigated through the right heart and into the main pulmonary outflow tract. Arteriography was performed. Evidence of large volume bilateral PE with decreased perfusion to the pulmonary parenchyma. Unfortunately, at this time pressures could not be measured due to technical issues. An angled catheter was used to navigate into the left lower lobe pulmonary artery. Limited left lower lobe pulmonary arteriography was performed. There is extensive clot burden with decreased perfusion. A rose in wire was placed. Next, the angled catheter was advanced into the right lower lobe pulmonary artery. Again, limited pulmonary arteriography was performed demonstrating large clot burden. A second rose in wire was placed. Next, 90 cm UniFuse infusion catheters were advanced. A 15 cm catheter was advanced into the right main pulmonary artery while a 10 cm infusion length catheter was advanced into the left pulmonary artery. 2 mg of tPA was then injected into each lysis catheter. The catheters were capped and connected to a tPA drip at 1 mg per hour per catheter. Both sheaths were secured to the skin with 0 silk suture. Sterile bandages  were applied. The patient tolerated the procedure well. FINDINGS: Large volume bilateral pulmonary emboli. Pulmonary arterial pressures could not be obtained secondary to technical issues. IMPRESSION: Successful initiation of bilateral pulmonary arterial thrombolysis. Electronically Signed   By: Jacqulynn Cadet M.D.   On: 12/25/2021 11:37   IR Angiogram Pulmonary Bilateral Selective  Result Date: 12/25/2021 INDICATION: 71 year old male with intermediate high risk bilateral pulmonary emboli with significant right heart strain (acute cor pulmonale). He is a candidate for bilateral pulmonary arterial thrombolysis. EXAM: IR ULTRASOUND GUIDANCE VASC ACCESS RIGHT; ADDITIONAL ARTERIOGRAPHY; IR INFUSION THROMBOL ARTERIAL INITIAL (MS); BILATERAL PULMONARY ARTERIOGRAPHY COMPARISON:  CT arteriogram 12/24/2021 MEDICATIONS: None. ANESTHESIA/SEDATION: Versed 1 mg IV; Fentanyl 50 mcg IV Moderate Sedation Time:  43 minutes The patient's vital signs and level of consciousness were continuously monitored during the procedure by the interventional radiology nurse under my direct supervision. FLUOROSCOPY TIME:  Radiation exposure index: 69 mGy reference air kerma COMPLICATIONS: None immediate. TECHNIQUE: Informed written consent was obtained from the patient after a thorough discussion of the procedural risks, benefits and alternatives. All questions were addressed. Maximal Sterile Barrier Technique was utilized including caps, mask, sterile gowns, sterile gloves, sterile  drape, hand hygiene and skin antiseptic. A timeout was performed prior to the initiation of the procedure. The right common femoral vein was interrogated with ultrasound and found to be widely patent. An image was obtained and stored for the medical record. Local anesthesia was attained by infiltration with 1% lidocaine. A small dermatotomy was made. Under real-time sonographic guidance, the vessel was punctured with a 21 gauge micropuncture needle. Using  standard technique, the initial micro needle was exchanged over a 0.018 micro wire for a transitional 4 [place] micro sheath. The micro sheath was then exchanged over a 0.035 wire for a 6 French vascular sheath. Using the same technique, a second puncture of the right common femoral vein was performed and a second 6 [place] vascular sheath was placed. Working first through the more medial sheath, a 6 French angled pigtail catheter was advanced over a Bentson wire and navigated through the right heart and into the main pulmonary outflow tract. Arteriography was performed. Evidence of large volume bilateral PE with decreased perfusion to the pulmonary parenchyma. Unfortunately, at this time pressures could not be measured due to technical issues. An angled catheter was used to navigate into the left lower lobe pulmonary artery. Limited left lower lobe pulmonary arteriography was performed. There is extensive clot burden with decreased perfusion. A rose in wire was placed. Next, the angled catheter was advanced into the right lower lobe pulmonary artery. Again, limited pulmonary arteriography was performed demonstrating large clot burden. A second rose in wire was placed. Next, 90 cm UniFuse infusion catheters were advanced. A 15 cm catheter was advanced into the right main pulmonary artery while a 10 cm infusion length catheter was advanced into the left pulmonary artery. 2 mg of tPA was then injected into each lysis catheter. The catheters were capped and connected to a tPA drip at 1 mg per hour per catheter. Both sheaths were secured to the skin with 0 silk suture. Sterile bandages were applied. The patient tolerated the procedure well. FINDINGS: Large volume bilateral pulmonary emboli. Pulmonary arterial pressures could not be obtained secondary to technical issues. IMPRESSION: Successful initiation of bilateral pulmonary arterial thrombolysis. Electronically Signed   By: Jacqulynn Cadet M.D.   On: 12/25/2021 11:37    IR Angiogram Selective Each Additional Vessel  Result Date: 12/25/2021 INDICATION: 71 year old male with intermediate high risk bilateral pulmonary emboli with significant right heart strain (acute cor pulmonale). He is a candidate for bilateral pulmonary arterial thrombolysis. EXAM: IR ULTRASOUND GUIDANCE VASC ACCESS RIGHT; ADDITIONAL ARTERIOGRAPHY; IR INFUSION THROMBOL ARTERIAL INITIAL (MS); BILATERAL PULMONARY ARTERIOGRAPHY COMPARISON:  CT arteriogram 12/24/2021 MEDICATIONS: None. ANESTHESIA/SEDATION: Versed 1 mg IV; Fentanyl 50 mcg IV Moderate Sedation Time:  43 minutes The patient's vital signs and level of consciousness were continuously monitored during the procedure by the interventional radiology nurse under my direct supervision. FLUOROSCOPY TIME:  Radiation exposure index: 69 mGy reference air kerma COMPLICATIONS: None immediate. TECHNIQUE: Informed written consent was obtained from the patient after a thorough discussion of the procedural risks, benefits and alternatives. All questions were addressed. Maximal Sterile Barrier Technique was utilized including caps, mask, sterile gowns, sterile gloves, sterile drape, hand hygiene and skin antiseptic. A timeout was performed prior to the initiation of the procedure. The right common femoral vein was interrogated with ultrasound and found to be widely patent. An image was obtained and stored for the medical record. Local anesthesia was attained by infiltration with 1% lidocaine. A small dermatotomy was made. Under real-time sonographic guidance, the vessel was punctured  with a 21 gauge micropuncture needle. Using standard technique, the initial micro needle was exchanged over a 0.018 micro wire for a transitional 4 [place] micro sheath. The micro sheath was then exchanged over a 0.035 wire for a 6 French vascular sheath. Using the same technique, a second puncture of the right common femoral vein was performed and a second 6 [place] vascular sheath was  placed. Working first through the more medial sheath, a 6 French angled pigtail catheter was advanced over a Bentson wire and navigated through the right heart and into the main pulmonary outflow tract. Arteriography was performed. Evidence of large volume bilateral PE with decreased perfusion to the pulmonary parenchyma. Unfortunately, at this time pressures could not be measured due to technical issues. An angled catheter was used to navigate into the left lower lobe pulmonary artery. Limited left lower lobe pulmonary arteriography was performed. There is extensive clot burden with decreased perfusion. A rose in wire was placed. Next, the angled catheter was advanced into the right lower lobe pulmonary artery. Again, limited pulmonary arteriography was performed demonstrating large clot burden. A second rose in wire was placed. Next, 90 cm UniFuse infusion catheters were advanced. A 15 cm catheter was advanced into the right main pulmonary artery while a 10 cm infusion length catheter was advanced into the left pulmonary artery. 2 mg of tPA was then injected into each lysis catheter. The catheters were capped and connected to a tPA drip at 1 mg per hour per catheter. Both sheaths were secured to the skin with 0 silk suture. Sterile bandages were applied. The patient tolerated the procedure well. FINDINGS: Large volume bilateral pulmonary emboli. Pulmonary arterial pressures could not be obtained secondary to technical issues. IMPRESSION: Successful initiation of bilateral pulmonary arterial thrombolysis. Electronically Signed   By: Jacqulynn Cadet M.D.   On: 12/25/2021 11:37   IR Angiogram Selective Each Additional Vessel  Result Date: 12/25/2021 INDICATION: 71 year old male with intermediate high risk bilateral pulmonary emboli with significant right heart strain (acute cor pulmonale). He is a candidate for bilateral pulmonary arterial thrombolysis. EXAM: IR ULTRASOUND GUIDANCE VASC ACCESS RIGHT; ADDITIONAL  ARTERIOGRAPHY; IR INFUSION THROMBOL ARTERIAL INITIAL (MS); BILATERAL PULMONARY ARTERIOGRAPHY COMPARISON:  CT arteriogram 12/24/2021 MEDICATIONS: None. ANESTHESIA/SEDATION: Versed 1 mg IV; Fentanyl 50 mcg IV Moderate Sedation Time:  43 minutes The patient's vital signs and level of consciousness were continuously monitored during the procedure by the interventional radiology nurse under my direct supervision. FLUOROSCOPY TIME:  Radiation exposure index: 69 mGy reference air kerma COMPLICATIONS: None immediate. TECHNIQUE: Informed written consent was obtained from the patient after a thorough discussion of the procedural risks, benefits and alternatives. All questions were addressed. Maximal Sterile Barrier Technique was utilized including caps, mask, sterile gowns, sterile gloves, sterile drape, hand hygiene and skin antiseptic. A timeout was performed prior to the initiation of the procedure. The right common femoral vein was interrogated with ultrasound and found to be widely patent. An image was obtained and stored for the medical record. Local anesthesia was attained by infiltration with 1% lidocaine. A small dermatotomy was made. Under real-time sonographic guidance, the vessel was punctured with a 21 gauge micropuncture needle. Using standard technique, the initial micro needle was exchanged over a 0.018 micro wire for a transitional 4 [place] micro sheath. The micro sheath was then exchanged over a 0.035 wire for a 6 French vascular sheath. Using the same technique, a second puncture of the right common femoral vein was performed and a second 6 [place] vascular  sheath was placed. Working first through the more medial sheath, a 6 French angled pigtail catheter was advanced over a Bentson wire and navigated through the right heart and into the main pulmonary outflow tract. Arteriography was performed. Evidence of large volume bilateral PE with decreased perfusion to the pulmonary parenchyma. Unfortunately, at this  time pressures could not be measured due to technical issues. An angled catheter was used to navigate into the left lower lobe pulmonary artery. Limited left lower lobe pulmonary arteriography was performed. There is extensive clot burden with decreased perfusion. A rose in wire was placed. Next, the angled catheter was advanced into the right lower lobe pulmonary artery. Again, limited pulmonary arteriography was performed demonstrating large clot burden. A second rose in wire was placed. Next, 90 cm UniFuse infusion catheters were advanced. A 15 cm catheter was advanced into the right main pulmonary artery while a 10 cm infusion length catheter was advanced into the left pulmonary artery. 2 mg of tPA was then injected into each lysis catheter. The catheters were capped and connected to a tPA drip at 1 mg per hour per catheter. Both sheaths were secured to the skin with 0 silk suture. Sterile bandages were applied. The patient tolerated the procedure well. FINDINGS: Large volume bilateral pulmonary emboli. Pulmonary arterial pressures could not be obtained secondary to technical issues. IMPRESSION: Successful initiation of bilateral pulmonary arterial thrombolysis. Electronically Signed   By: Jacqulynn Cadet M.D.   On: 12/25/2021 11:37   CT Angio Chest PE W and/or Wo Contrast  Result Date: 12/24/2021 CLINICAL DATA:  Suspected pulmonary embolism in a 71 year old male. EXAM: CT ANGIOGRAPHY CHEST WITH CONTRAST TECHNIQUE: Multidetector CT imaging of the chest was performed using the standard protocol during bolus administration of intravenous contrast. Multiplanar CT image reconstructions and MIPs were obtained to evaluate the vascular anatomy. RADIATION DOSE REDUCTION: This exam was performed according to the departmental dose-optimization program which includes automated exposure control, adjustment of the mA and/or kV according to patient size and/or use of iterative reconstruction technique. CONTRAST:  55m  OMNIPAQUE IOHEXOL 350 MG/ML SOLN COMPARISON:  Chest CT of February 2019. FINDINGS: Cardiovascular: Main pulmonary artery is opacified to 552 Hounsfield units. There are bilateral LEFT and RIGHT pulmonary arterial emboli which extend into all segmental branches on the RIGHT with occlusive thrombotic changes in RIGHT lower lobe pulmonary artery. Pulmonary emboli also extending throughout the LEFT lobar and segmental branches with occlusive thrombus in segmental branch to the LEFT lower lobe laterally resulting in a focus of airspace disease compatible with pulmonary infarct. Elevated RV to LV ratio to 2.12. Marked RIGHT heart enlargement in the setting of RIGHT heart strain. No pericardial effusion or signs of pericardial nodularity. Aortic caliber is normal. Aorta not well assessed due to phase of contrast enhancement, maximize for evaluation of pulmonary vascular bed. Mediastinum/Nodes: No thoracic inlet, axillary, mediastinal or hilar adenopathy. Esophagus grossly normal. Lungs/Pleura: Patchy ground-glass opacities in addition to signs of pulmonary infarct the in the LEFT lower lobe. No lobar consolidative process. No pneumothorax. No pleural effusion. Airways are patent. Upper Abdomen: Incidental imaging of upper abdominal contents shows no acute process related to visualized portions the liver, gallbladder, pancreas, spleen, adrenal glands or kidneys. Nephrolithiasis in the upper pole the LEFT kidney partially visualized, at least 5 mm calculus in the upper pole. No upper abdominal lymphadenopathy. Musculoskeletal: Redemonstration of compression fractures at T11 and T12. Slight increased loss of height at T11 with slightly greater than 50% loss of height at this  level. Post cement augmentation at T12 as before. Loss of height at L1 is unchanged. Review of the MIP images confirms the above findings. IMPRESSION: 1. Bilateral LEFT and RIGHT pulmonary arterial emboli which extend into all lobar and segmental branches  on the RIGHT with occlusive thrombosis in RIGHT lower lobe pulmonary artery. Extensive LEFT pulmonary artery emboli extending into segmental and lobar level branches as well with occlusive thrombus in segmental branches of the LEFT lower lobe. Positive for acute PE with CT evidence of marked right heart strain (RV/LV Ratio = 2.12) consistent with at least submassive (intermediate risk) PE. The presence of right heart strain has been associated with an increased risk of morbidity and mortality. Please refer to the "Code PE Focused" order set in EPIC. 2. 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 3. Interval loss of height at T11 at the site of previous compression fracture. Correlate with any new or recent worsening of. Symptoms in this area Findings of pulmonary emboli and RIGHT heart strain were related to the provider is outlined below. Critical Value/emergent results were called by telephone at the time of interpretation on 12/24/2021 at 12:35 pm to provider Regan Lemming , who verbally acknowledged these results. Aortic Atherosclerosis (ICD10-I70.0). Electronically Signed   By: Zetta Bills M.D.   On: 12/24/2021 12:50     LOS: 2 days   Oren Binet, MD  [facility]    To contact the attending provider between 7A-7P or the covering provider during after hours 7P-7A, please log into the web site [email or web address] and access using universal [place] password for that web site. If you do not have the password, please call the hospital operator.  12/26/2021, 10:30 AM

## 2021-12-26 NOTE — Plan of Care (Signed)

## 2021-12-26 NOTE — Progress Notes (Signed)
[facility] Progress Note Patient Name: David Armstrong DOB: Jan 02, 1951 MRN: [id number]   Date of Service  12/26/2021  HPI/Events of Note  Pt requesting for home dose of ativan for sleep.  Melatonin was not helpful for the patient.   Pt is awake and alert on camera assesment.  eICU Interventions  Ativan ordered.      Intervention Category Intermediate Interventions: Other:  Elsie Lincoln 12/26/2021, 12:24 AM

## 2021-12-27 ENCOUNTER — Other Ambulatory Visit (HOSPITAL_COMMUNITY): Payer: Self-pay

## 2021-12-27 DIAGNOSIS — J9601 Acute respiratory failure with hypoxia: Secondary | ICD-10-CM | POA: Diagnosis not present

## 2021-12-27 DIAGNOSIS — R778 Other specified abnormalities of plasma proteins: Secondary | ICD-10-CM | POA: Diagnosis not present

## 2021-12-27 DIAGNOSIS — I2602 Saddle embolus of pulmonary artery with acute cor pulmonale: Secondary | ICD-10-CM | POA: Diagnosis not present

## 2021-12-27 DIAGNOSIS — I824Y1 Acute embolism and thrombosis of unspecified deep veins of right proximal lower extremity: Secondary | ICD-10-CM | POA: Diagnosis not present

## 2021-12-27 LAB — CBC
HCT: 34.6 % — ABNORMAL LOW (ref 39.0–52.0)
Hemoglobin: 12 g/dL — ABNORMAL LOW (ref 13.0–17.0)
MCH: 30.8 pg (ref 26.0–34.0)
MCHC: 34.7 g/dL (ref 30.0–36.0)
MCV: 88.7 fL (ref 80.0–100.0)
Platelets: 128 10*3/uL — ABNORMAL LOW (ref 150–400)
RBC: 3.9 MIL/uL — ABNORMAL LOW (ref 4.22–5.81)
RDW: 14.3 % (ref 11.5–15.5)
WBC: 5 10*3/uL (ref 4.0–10.5)
nRBC: 0 % (ref 0.0–0.2)

## 2021-12-27 MED ORDER — RIVAROXABAN (XARELTO) VTE STARTER PACK (15 & 20 MG)
ORAL_TABLET | ORAL | 0 refills | Status: DC
  Filled 2021-12-27: qty 51, 30d supply, fill #0

## 2021-12-27 MED ORDER — RIVAROXABAN 20 MG PO TABS: 20.0000 mg | ORAL_TABLET | Freq: Every day | ORAL | 1 refills | Status: AC

## 2021-12-27 MED ORDER — RIVAROXABAN (XARELTO) VTE STARTER PACK (15 & 20 MG): ORAL_TABLET | ORAL | 0 refills | Status: DC

## 2021-12-27 NOTE — Care Management (Signed)
12-27-21 1054 Case Manager received a consult for medication assistance for Xarelto. Patient is a member of the [place] location- 100% service connected. Patient states the pharmacy fills his medications and he has received Xarelto from them in the past. No further needs from Case Manager at this time.

## 2021-12-27 NOTE — Discharge Summary (Signed)
PATIENT DETAILS Name: David Armstrong Age: 71 y.o. Sex: male Date of Birth: 1951-03-11 MRN: [id number]. Admitting Physician: Candee Furbish, MD YQM:VHQION, [name], MD  Admit Date: 12/24/2021 Discharge date: 12/27/2021  Recommendations for Outpatient Follow-up:  Follow up with PCP in 1-2 weeks Please obtain CMP/CBC in one week Needs indefinite anticoagulation.  Admitted From:  Home  Disposition: Home   Discharge Condition: fair  CODE STATUS:   Code Status: Prior   Diet recommendation:  Diet Order             Diet - low sodium heart healthy           Diet regular Room service appropriate? Yes; Fluid consistency: Thin  Diet effective now                    Brief Summary: Patient is a 71 y.o.  male with prior history of VTE-noncompliant with Xarelto (not taking for almost 4 weeks) who presented with exertional dyspnea-found to have submassive PE with acute hypoxic respiratory failure-evaluated by PCCM/IR-underwent catheter guided lytic therapy.  Upon stability-transferred to [facility] on 9/14.   Significant events: 9/12>> admit to ICU-hypoxia-submassive PE. 9/14>> transferred to [facility].   Significant studies: 9/12>> CT angio chest: Submassive PE, pulmonary infarct left lower lobe. 9/12>> DVT right femoral/popliteal vein. 9/13>> Echo: EF 62-95%, RV systolic function is normal.   Significant microbiology data: 9/12>> COVID/influenza PCR: Negative   Procedures: 9/12>> catheter guided lytic therapy.   Consults: CCM, [place] [facility] Course: Acute hypoxic respiratory failure due to submassive PE: Underwent catheter guided thrombolytic therapy by IR-subsequently restarted on anticoagulation with IV heparin-changed to Xarelto on 9/14.  Ambulated with therapy services-his exertional dyspnea is significantly better-he is almost back to baseline.  He is on room air.  Compliance to anticoagulation has been counseled extensively-he is aware that he probably needs  indefinite anticoagulation.  He wants to continue taking Gabriel Rainwater has some supplies at home that he picked up recently-hence we will discharge him on a starter pack-he is aware that after he completes the starter pack-he needs to continue his Xarelto as previous.  He will need to follow-up with his primary care practitioner for further continued care.    Right lower extremity DVT: No major swelling observed-he is already on anticoagulation.  See above.   History of recurrent VTE-noncompliant with anticoagulation: Counseled extensively-he will require indefinite anticoagulation.   Thrombocytopenia: Mild-likely due to fluid consumption from submassive PE/DVT.  Improving.  PCP to monitor periodically.   Minimally elevated troponin: Due to demand ischemia in the setting of submassive PE.  Echo stable without any wall motion abnormality.  Doubt further work-up is required.  Pressure Ulcer: Pressure Injury 12/24/21 Coccyx Mid Stage 1 -  Intact skin with non-blanchable redness of a localized area usually over a bony prominence. (Active)  12/24/21 1700  Location: Coccyx  Location Orientation: Mid  Staging: Stage 1 -  Intact skin with non-blanchable redness of a localized area usually over a bony prominence.  Wound Description (Comments):   Present on Admission: Yes  Dressing Type Foam - Lift dressing to assess site every shift 12/27/21 0800    BMI: Estimated body mass index is 20.71 kg/m as calculated from the following:   Height as of this encounter: '6\' 1"'$  (1.854 m).   Weight as of this encounter: 71.2 kg.    Discharge Diagnoses:  Principal Problem:   Pulmonary emboli ([place]) Active Problems:   Acute respiratory failure with hypoxia (HCC)  Elevated troponin   Pressure injury of skin   Discharge Instructions:  Activity:  As tolerated   Discharge Instructions     Call MD for:  difficulty breathing, headache or visual disturbances   Complete by: As directed    Call MD for:   extreme fatigue   Complete by: As directed    Call MD for:  persistant dizziness or light-headedness   Complete by: As directed    Diet - low sodium heart healthy   Complete by: As directed    Discharge instructions   Complete by: As directed    Follow with Primary MD  Vivi Barrack, MD in 1-2 weeks  Please get a complete blood count and chemistry panel checked by your Primary MD at your next visit, and again as instructed by your Primary MD.  Get Medicines reviewed and adjusted: Please take all your medications with you for your next visit with your Primary MD  Laboratory/radiological data: Please request your Primary MD to go over all hospital tests and procedure/radiological results at the follow up, please ask your Primary MD to get all Hospital records sent to his/her office.  In some cases, they will be blood work, cultures and biopsy results pending at the time of your discharge. Please request that your primary care M.D. follows up on these results.  Also Note the following: If you experience worsening of your admission symptoms, develop shortness of breath, life threatening emergency, suicidal or homicidal thoughts you must seek medical attention immediately by calling 911 or calling your MD immediately  if symptoms less severe.  You must read complete instructions/literature along with all the possible adverse reactions/side effects for all the Medicines you take and that have been prescribed to you. Take any new Medicines after you have completely understood and accpet all the possible adverse reactions/side effects.   Do not drive when taking Pain medications or sleeping medications (Benzodaizepines)  Do not take more than prescribed Pain, Sleep and Anxiety Medications. It is not advisable to combine anxiety,sleep and pain medications without talking with your primary care practitioner  Special Instructions: If you have smoked or chewed Tobacco  in the last 2 yrs please  stop smoking, stop any regular Alcohol  and or any Recreational drug use.  Wear Seat belts while driving.  Please note: You were cared for by a hospitalist during your hospital stay. Once you are discharged, your primary care physician will handle any further medical issues. Please note that NO REFILLS for any discharge medications will be authorized once you are discharged, as it is imperative that you return to your primary care physician (or establish a relationship with a primary care physician if you do not have one) for your post hospital discharge needs so that they can reassess your need for medications and monitor your lab values.   Increase activity slowly   Complete by: As directed    No wound care   Complete by: As directed       Allergies as of 12/27/2021       Reactions   Effexor Xr [venlafaxine Hcl Er] Other (See Comments)   Insomnia   Acyclovir And Related    Zocor [simvastatin] Other (See Comments)   Muscle ache/ pain   Dexilant [dexlansoprazole] Other (See Comments)   Muscle aches   Ivp Dye [iodinated Contrast Media] Rash   Soma [carisoprodol] Other (See Comments)   Sores on arm   Sulfa Antibiotics Rash  Medication List     STOP taking these medications    betamethasone dipropionate 0.05 % cream   calcitonin (salmon) 200 UNIT/ACT nasal spray Commonly known as: Miacalcin   desonide 0.05 % lotion Commonly known as: DESOWEN   furosemide 20 MG tablet Commonly known as: LASIX   linaclotide 145 MCG Caps capsule Commonly known as: Linzess   Medical Compression Stockings Misc   potassium chloride 10 MEQ tablet Commonly known as: KLOR-CON M   tiZANidine 4 MG tablet Commonly known as: Zanaflex       TAKE these medications    fluticasone 50 MCG/ACT nasal spray Commonly known as: FLONASE Place 2 sprays into both nostrils daily. USE twice a day for 1 week, then reduce to using daily.   hyoscyamine 0.125 MG SL tablet Commonly known as:  LEVSIN SL Take 2 tablets (0.25 mg total) by mouth every 6 (six) hours as needed. DISSOLVE 1 TABLET(0.125 MG) UNDER THE TONGUE EVERY 6 HOURS AS NEEDED   ibandronate 150 MG tablet Commonly known as: BONIVA Take 150 mg by mouth every 30 (thirty) days.   LORazepam 1 MG tablet Commonly known as: ATIVAN Take 0.5-1 tablets (0.5-1 mg total) by mouth every 8 (eight) hours as needed for anxiety.   omeprazole 20 MG capsule Commonly known as: PRILOSEC Take 1 capsule (20 mg total) by mouth daily.   Rivaroxaban Stater Pack (15 mg and 20 mg) Commonly known as: XARELTO STARTER PACK Follow package directions: Take one '15mg'$  tablet by mouth twice a day. On day 22, switch to one '20mg'$  tablet once a day. Take with food.  Once you complete this starter pack-resume your usual dosing of Xarelto that you have at home. What changed: You were already taking a medication with the same name, and this prescription was added. Make sure you understand how and when to take each.   rivaroxaban 20 MG Tabs tablet Commonly known as: XARELTO Take 1 tablet (20 mg total) by mouth daily with supper. START this only after you have completed the starter pack. Start taking on: January 26, 2022 What changed:  additional instructions These instructions start on January 26, 2022. If you are unsure what to do until then, ask your doctor or other care provider.   traMADol 50 MG tablet Commonly known as: ULTRAM Take 50 mg by mouth 3 (three) times daily as needed.   triamcinolone cream 0.1 % Commonly known as: KENALOG Apply topically 2 (two) times daily as needed.        Allergies  Allergen Reactions   Effexor Xr [Venlafaxine Hcl Er] Other (See Comments)    Insomnia   Acyclovir And Related    Zocor [Simvastatin] Other (See Comments)    Muscle ache/ pain   Dexilant [Dexlansoprazole] Other (See Comments)    Muscle aches   Ivp Dye [Iodinated Contrast Media] Rash   Soma [Carisoprodol] Other (See Comments)    Sores on arm    Sulfa Antibiotics Rash     Other Procedures/Studies: ECHOCARDIOGRAM COMPLETE  Result Date: 12/25/2021    ECHOCARDIOGRAM REPORT   Patient Name:   REED DADY Date of Exam: 12/25/2021 Medical Rec #:  [id number]        Height:       73.0 in Accession #:    [id number]       Weight:       157.0 lb Date of Birth:  February 13, 1951        BSA:  1.941 m Patient Age:    74 years         BP:           103/74 mmHg Patient Gender: M                HR:           77 bpm. Exam Location:  Inpatient Procedure: 2D Echo Indications:    pulmonary embolus  History:        Patient has prior history of Echocardiogram examinations, most                 recent 02/13/2021. Signs/Symptoms:elevated troponin.  Sonographer:    Johny Chess RDCS Referring Phys: [id number] [place]  1. Left ventricular ejection fraction, by estimation, is 55 to 60%. The left ventricle has normal function. The left ventricle has no regional wall motion abnormalities. Left ventricular diastolic parameters are consistent with Grade I diastolic dysfunction (impaired relaxation).  2. Right ventricular systolic function is normal. The right ventricular size is mildly enlarged. Tricuspid regurgitation signal is inadequate for assessing PA pressure.  3. Left atrial size was mildly dilated.  4. The mitral valve is normal in structure. Trivial mitral valve regurgitation. No evidence of mitral stenosis.  5. The aortic valve is tricuspid. There is mild calcification of the aortic valve. Aortic valve regurgitation is not visualized. No aortic stenosis is present.  6. Aortic dilatation noted. There is mild dilatation of the aortic root, measuring 38 mm.  7. The inferior vena cava is normal in size with greater than 50% respiratory variability, suggesting right atrial pressure of 3 mmHg. FINDINGS  Left Ventricle: Left ventricular ejection fraction, by estimation, is 55 to 60%. The left ventricle has normal function. The left ventricle has no  regional wall motion abnormalities. The left ventricular internal cavity size was normal in size. There is  no left ventricular hypertrophy. Left ventricular diastolic parameters are consistent with Grade I diastolic dysfunction (impaired relaxation). Right Ventricle: The right ventricular size is mildly enlarged. No increase in right ventricular wall thickness. Right ventricular systolic function is normal. Tricuspid regurgitation signal is inadequate for assessing PA pressure. Left Atrium: Left atrial size was mildly dilated. Right Atrium: Right atrial size was normal in size. Pericardium: There is no evidence of pericardial effusion. Mitral Valve: The mitral valve is normal in structure. There is mild calcification of the mitral valve leaflet(s). Trivial mitral valve regurgitation. No evidence of mitral valve stenosis. Tricuspid Valve: The tricuspid valve is normal in structure. Tricuspid valve regurgitation is not demonstrated. Aortic Valve: The aortic valve is tricuspid. There is mild calcification of the aortic valve. Aortic valve regurgitation is not visualized. No aortic stenosis is present. Pulmonic Valve: The pulmonic valve was normal in structure. Pulmonic valve regurgitation is trivial. Aorta: Aortic dilatation noted. There is mild dilatation of the aortic root, measuring 38 mm. Venous: The inferior vena cava is normal in size with greater than 50% respiratory variability, suggesting right atrial pressure of 3 mmHg. IAS/Shunts: No atrial level shunt detected by color flow Doppler.  LEFT VENTRICLE PLAX 2D LVIDd:         4.80 cm   Diastology LVIDs:         3.00 cm   LV e' medial:    6.64 cm/s LV PW:         0.80 cm   LV E/e' medial:  10.9 LV IVS:        0.70 cm   LV  e' lateral:   7.62 cm/s LVOT diam:     2.30 cm   LV E/e' lateral: 9.5 LV SV:         77 LV SV Index:   40 LVOT Area:     4.15 cm  RIGHT VENTRICLE             IVC RV S prime:     13.60 cm/s  IVC diam: 2.00 cm LEFT ATRIUM             Index         RIGHT ATRIUM           Index LA diam:        3.60 cm 1.85 cm/m   RA Area:     13.00 cm LA Vol (A2C):   66.2 ml 34.10 ml/m  RA Volume:   29.10 ml  14.99 ml/m LA Vol (A4C):   52.2 ml 26.89 ml/m LA Biplane Vol: 59.5 ml 30.65 ml/m  AORTIC VALVE LVOT Vmax:   96.40 cm/s LVOT Vmean:  63.600 cm/s LVOT VTI:    0.185 m  AORTA Ao Root diam: 3.80 cm Ao Asc diam:  3.20 cm MITRAL VALVE MV Area (PHT): 4.06 cm    SHUNTS MV Decel Time: 187 msec    Systemic VTI:  0.18 m MV E velocity: 72.40 cm/s  Systemic Diam: 2.30 cm MV A velocity: 79.80 cm/s MV E/A ratio:  0.91 Dalton McleanMD Electronically signed by Franki Monte Signature Date/Time: 12/25/2021/3:29:52 PM    Final    IR THROMB F/U EVAL ART/VEN FINAL DAY (MS)  Result Date: 12/25/2021 INDICATION: History of sub massive pulmonary embolism, post initiation of bilateral pulmonary arterial thrombolysis on 12/24/2021. Patient has completed prescribed course of bilateral pulmonary arterial lytic infusion and presents today for repeat pulmonary arterial pressure measurements prior to infusion catheter removal. EXAM: PULMONARY ARTERIAL PRESSURE MEASUREMENT AND INFUSION CATHETER(S) REMOVAL COMPARISON:  Initiation of bilateral catheter directed pulmonary arterial thrombolysis-12/24/2021 Chest CTA-12/24/2021 MEDICATIONS: None CONTRAST:  None FLUOROSCOPY TIME:  None COMPLICATIONS: None immediate. TECHNIQUE: The patient was positioned supine on his hospital bed. The right pulmonary arterial catheter's infusion wire was removed and the multi side-hole infusion catheter was retracted to the estimated location of the main pulmonary artery. Pressure measurements were acquired from this location. At this point, the procedure was terminated. All wires, catheters and sheaths were removed from the patient. Hemostasis was achieved at the right groin access site with manual compression. A dressing was placed. The patient tolerated the procedure well without immediate postprocedural  complication. FINDINGS: Acquired pressure measurements as follows: Preprocedural main pulmonary artery - unable to be obtained due to technical issues. Postprocedural main pulmonary artery - 22/11; mean - 15 IMPRESSION: Technically successful bilateral catheter directed pulmonary arterial thrombolysis with normal postprocedural pulmonary arterial pressure measurements following 12 hour lysis infusion. Electronically Signed   By: Sandi Mariscal M.D.   On: 12/25/2021 13:39   IR INFUSION THROMBOL ARTERIAL INITIAL (MS)  Result Date: 12/25/2021 INDICATION: 71 year old male with intermediate high risk bilateral pulmonary emboli with significant right heart strain (acute cor pulmonale). He is a candidate for bilateral pulmonary arterial thrombolysis. EXAM: IR ULTRASOUND GUIDANCE VASC ACCESS RIGHT; ADDITIONAL ARTERIOGRAPHY; IR INFUSION THROMBOL ARTERIAL INITIAL (MS); BILATERAL PULMONARY ARTERIOGRAPHY COMPARISON:  CT arteriogram 12/24/2021 MEDICATIONS: None. ANESTHESIA/SEDATION: Versed 1 mg IV; Fentanyl 50 mcg IV Moderate Sedation Time:  43 minutes The patient's vital signs and level of consciousness were continuously monitored during the procedure by the interventional radiology nurse under  my direct supervision. FLUOROSCOPY TIME:  Radiation exposure index: 69 mGy reference air kerma COMPLICATIONS: None immediate. TECHNIQUE: Informed written consent was obtained from the patient after a thorough discussion of the procedural risks, benefits and alternatives. All questions were addressed. Maximal Sterile Barrier Technique was utilized including caps, mask, sterile gowns, sterile gloves, sterile drape, hand hygiene and skin antiseptic. A timeout was performed prior to the initiation of the procedure. The right common femoral vein was interrogated with ultrasound and found to be widely patent. An image was obtained and stored for the medical record. Local anesthesia was attained by infiltration with 1% lidocaine. A small  dermatotomy was made. Under real-time sonographic guidance, the vessel was punctured with a 21 gauge micropuncture needle. Using standard technique, the initial micro needle was exchanged over a 0.018 micro wire for a transitional 4 [place] micro sheath. The micro sheath was then exchanged over a 0.035 wire for a 6 French vascular sheath. Using the same technique, a second puncture of the right common femoral vein was performed and a second 6 [place] vascular sheath was placed. Working first through the more medial sheath, a 6 French angled pigtail catheter was advanced over a Bentson wire and navigated through the right heart and into the main pulmonary outflow tract. Arteriography was performed. Evidence of large volume bilateral PE with decreased perfusion to the pulmonary parenchyma. Unfortunately, at this time pressures could not be measured due to technical issues. An angled catheter was used to navigate into the left lower lobe pulmonary artery. Limited left lower lobe pulmonary arteriography was performed. There is extensive clot burden with decreased perfusion. A rose in wire was placed. Next, the angled catheter was advanced into the right lower lobe pulmonary artery. Again, limited pulmonary arteriography was performed demonstrating large clot burden. A second rose in wire was placed. Next, 90 cm UniFuse infusion catheters were advanced. A 15 cm catheter was advanced into the right main pulmonary artery while a 10 cm infusion length catheter was advanced into the left pulmonary artery. 2 mg of tPA was then injected into each lysis catheter. The catheters were capped and connected to a tPA drip at 1 mg per hour per catheter. Both sheaths were secured to the skin with 0 silk suture. Sterile bandages were applied. The patient tolerated the procedure well. FINDINGS: Large volume bilateral pulmonary emboli. Pulmonary arterial pressures could not be obtained secondary to technical issues. IMPRESSION: Successful  initiation of bilateral pulmonary arterial thrombolysis. Electronically Signed   By: Jacqulynn Cadet M.D.   On: 12/25/2021 11:37   IR US Guide Vasc Access Right  Result Date: 12/25/2021 INDICATION: 71 year old male with intermediate high risk bilateral pulmonary emboli with significant right heart strain (acute cor pulmonale). He is a candidate for bilateral pulmonary arterial thrombolysis. EXAM: IR ULTRASOUND GUIDANCE VASC ACCESS RIGHT; ADDITIONAL ARTERIOGRAPHY; IR INFUSION THROMBOL ARTERIAL INITIAL (MS); BILATERAL PULMONARY ARTERIOGRAPHY COMPARISON:  CT arteriogram 12/24/2021 MEDICATIONS: None. ANESTHESIA/SEDATION: Versed 1 mg IV; Fentanyl 50 mcg IV Moderate Sedation Time:  43 minutes The patient's vital signs and level of consciousness were continuously monitored during the procedure by the interventional radiology nurse under my direct supervision. FLUOROSCOPY TIME:  Radiation exposure index: 69 mGy reference air kerma COMPLICATIONS: None immediate. TECHNIQUE: Informed written consent was obtained from the patient after a thorough discussion of the procedural risks, benefits and alternatives. All questions were addressed. Maximal Sterile Barrier Technique was utilized including caps, mask, sterile gowns, sterile gloves, sterile drape, hand hygiene and skin antiseptic. A timeout was  performed prior to the initiation of the procedure. The right common femoral vein was interrogated with ultrasound and found to be widely patent. An image was obtained and stored for the medical record. Local anesthesia was attained by infiltration with 1% lidocaine. A small dermatotomy was made. Under real-time sonographic guidance, the vessel was punctured with a 21 gauge micropuncture needle. Using standard technique, the initial micro needle was exchanged over a 0.018 micro wire for a transitional 4 [place] micro sheath. The micro sheath was then exchanged over a 0.035 wire for a 6 French vascular sheath. Using the same  technique, a second puncture of the right common femoral vein was performed and a second 6 [place] vascular sheath was placed. Working first through the more medial sheath, a 6 French angled pigtail catheter was advanced over a Bentson wire and navigated through the right heart and into the main pulmonary outflow tract. Arteriography was performed. Evidence of large volume bilateral PE with decreased perfusion to the pulmonary parenchyma. Unfortunately, at this time pressures could not be measured due to technical issues. An angled catheter was used to navigate into the left lower lobe pulmonary artery. Limited left lower lobe pulmonary arteriography was performed. There is extensive clot burden with decreased perfusion. A rose in wire was placed. Next, the angled catheter was advanced into the right lower lobe pulmonary artery. Again, limited pulmonary arteriography was performed demonstrating large clot burden. A second rose in wire was placed. Next, 90 cm UniFuse infusion catheters were advanced. A 15 cm catheter was advanced into the right main pulmonary artery while a 10 cm infusion length catheter was advanced into the left pulmonary artery. 2 mg of tPA was then injected into each lysis catheter. The catheters were capped and connected to a tPA drip at 1 mg per hour per catheter. Both sheaths were secured to the skin with 0 silk suture. Sterile bandages were applied. The patient tolerated the procedure well. FINDINGS: Large volume bilateral pulmonary emboli. Pulmonary arterial pressures could not be obtained secondary to technical issues. IMPRESSION: Successful initiation of bilateral pulmonary arterial thrombolysis. Electronically Signed   By: Jacqulynn Cadet M.D.   On: 12/25/2021 11:37   IR INFUSION THROMBOL ARTERIAL INITIAL (MS)  Result Date: 12/25/2021 INDICATION: 71 year old male with intermediate high risk bilateral pulmonary emboli with significant right heart strain (acute cor pulmonale). He is a  candidate for bilateral pulmonary arterial thrombolysis. EXAM: IR ULTRASOUND GUIDANCE VASC ACCESS RIGHT; ADDITIONAL ARTERIOGRAPHY; IR INFUSION THROMBOL ARTERIAL INITIAL (MS); BILATERAL PULMONARY ARTERIOGRAPHY COMPARISON:  CT arteriogram 12/24/2021 MEDICATIONS: None. ANESTHESIA/SEDATION: Versed 1 mg IV; Fentanyl 50 mcg IV Moderate Sedation Time:  43 minutes The patient's vital signs and level of consciousness were continuously monitored during the procedure by the interventional radiology nurse under my direct supervision. FLUOROSCOPY TIME:  Radiation exposure index: 69 mGy reference air kerma COMPLICATIONS: None immediate. TECHNIQUE: Informed written consent was obtained from the patient after a thorough discussion of the procedural risks, benefits and alternatives. All questions were addressed. Maximal Sterile Barrier Technique was utilized including caps, mask, sterile gowns, sterile gloves, sterile drape, hand hygiene and skin antiseptic. A timeout was performed prior to the initiation of the procedure. The right common femoral vein was interrogated with ultrasound and found to be widely patent. An image was obtained and stored for the medical record. Local anesthesia was attained by infiltration with 1% lidocaine. A small dermatotomy was made. Under real-time sonographic guidance, the vessel was punctured with a 21 gauge micropuncture needle. Using standard technique,  the initial micro needle was exchanged over a 0.018 micro wire for a transitional 4 [place] micro sheath. The micro sheath was then exchanged over a 0.035 wire for a 6 French vascular sheath. Using the same technique, a second puncture of the right common femoral vein was performed and a second 6 [place] vascular sheath was placed. Working first through the more medial sheath, a 6 French angled pigtail catheter was advanced over a Bentson wire and navigated through the right heart and into the main pulmonary outflow tract. Arteriography was  performed. Evidence of large volume bilateral PE with decreased perfusion to the pulmonary parenchyma. Unfortunately, at this time pressures could not be measured due to technical issues. An angled catheter was used to navigate into the left lower lobe pulmonary artery. Limited left lower lobe pulmonary arteriography was performed. There is extensive clot burden with decreased perfusion. A rose in wire was placed. Next, the angled catheter was advanced into the right lower lobe pulmonary artery. Again, limited pulmonary arteriography was performed demonstrating large clot burden. A second rose in wire was placed. Next, 90 cm UniFuse infusion catheters were advanced. A 15 cm catheter was advanced into the right main pulmonary artery while a 10 cm infusion length catheter was advanced into the left pulmonary artery. 2 mg of tPA was then injected into each lysis catheter. The catheters were capped and connected to a tPA drip at 1 mg per hour per catheter. Both sheaths were secured to the skin with 0 silk suture. Sterile bandages were applied. The patient tolerated the procedure well. FINDINGS: Large volume bilateral pulmonary emboli. Pulmonary arterial pressures could not be obtained secondary to technical issues. IMPRESSION: Successful initiation of bilateral pulmonary arterial thrombolysis. Electronically Signed   By: Jacqulynn Cadet M.D.   On: 12/25/2021 11:37   IR Angiogram Pulmonary Bilateral Selective  Result Date: 12/25/2021 INDICATION: 71 year old male with intermediate high risk bilateral pulmonary emboli with significant right heart strain (acute cor pulmonale). He is a candidate for bilateral pulmonary arterial thrombolysis. EXAM: IR ULTRASOUND GUIDANCE VASC ACCESS RIGHT; ADDITIONAL ARTERIOGRAPHY; IR INFUSION THROMBOL ARTERIAL INITIAL (MS); BILATERAL PULMONARY ARTERIOGRAPHY COMPARISON:  CT arteriogram 12/24/2021 MEDICATIONS: None. ANESTHESIA/SEDATION: Versed 1 mg IV; Fentanyl 50 mcg IV Moderate  Sedation Time:  43 minutes The patient's vital signs and level of consciousness were continuously monitored during the procedure by the interventional radiology nurse under my direct supervision. FLUOROSCOPY TIME:  Radiation exposure index: 69 mGy reference air kerma COMPLICATIONS: None immediate. TECHNIQUE: Informed written consent was obtained from the patient after a thorough discussion of the procedural risks, benefits and alternatives. All questions were addressed. Maximal Sterile Barrier Technique was utilized including caps, mask, sterile gowns, sterile gloves, sterile drape, hand hygiene and skin antiseptic. A timeout was performed prior to the initiation of the procedure. The right common femoral vein was interrogated with ultrasound and found to be widely patent. An image was obtained and stored for the medical record. Local anesthesia was attained by infiltration with 1% lidocaine. A small dermatotomy was made. Under real-time sonographic guidance, the vessel was punctured with a 21 gauge micropuncture needle. Using standard technique, the initial micro needle was exchanged over a 0.018 micro wire for a transitional 4 [place] micro sheath. The micro sheath was then exchanged over a 0.035 wire for a 6 French vascular sheath. Using the same technique, a second puncture of the right common femoral vein was performed and a second 6 [place] vascular sheath was placed. Working first through the more medial sheath,  a 6 French angled pigtail catheter was advanced over a Bentson wire and navigated through the right heart and into the main pulmonary outflow tract. Arteriography was performed. Evidence of large volume bilateral PE with decreased perfusion to the pulmonary parenchyma. Unfortunately, at this time pressures could not be measured due to technical issues. An angled catheter was used to navigate into the left lower lobe pulmonary artery. Limited left lower lobe pulmonary arteriography was performed. There  is extensive clot burden with decreased perfusion. A rose in wire was placed. Next, the angled catheter was advanced into the right lower lobe pulmonary artery. Again, limited pulmonary arteriography was performed demonstrating large clot burden. A second rose in wire was placed. Next, 90 cm UniFuse infusion catheters were advanced. A 15 cm catheter was advanced into the right main pulmonary artery while a 10 cm infusion length catheter was advanced into the left pulmonary artery. 2 mg of tPA was then injected into each lysis catheter. The catheters were capped and connected to a tPA drip at 1 mg per hour per catheter. Both sheaths were secured to the skin with 0 silk suture. Sterile bandages were applied. The patient tolerated the procedure well. FINDINGS: Large volume bilateral pulmonary emboli. Pulmonary arterial pressures could not be obtained secondary to technical issues. IMPRESSION: Successful initiation of bilateral pulmonary arterial thrombolysis. Electronically Signed   By: Jacqulynn Cadet M.D.   On: 12/25/2021 11:37   IR Angiogram Selective Each Additional Vessel  Result Date: 12/25/2021 INDICATION: 71 year old male with intermediate high risk bilateral pulmonary emboli with significant right heart strain (acute cor pulmonale). He is a candidate for bilateral pulmonary arterial thrombolysis. EXAM: IR ULTRASOUND GUIDANCE VASC ACCESS RIGHT; ADDITIONAL ARTERIOGRAPHY; IR INFUSION THROMBOL ARTERIAL INITIAL (MS); BILATERAL PULMONARY ARTERIOGRAPHY COMPARISON:  CT arteriogram 12/24/2021 MEDICATIONS: None. ANESTHESIA/SEDATION: Versed 1 mg IV; Fentanyl 50 mcg IV Moderate Sedation Time:  43 minutes The patient's vital signs and level of consciousness were continuously monitored during the procedure by the interventional radiology nurse under my direct supervision. FLUOROSCOPY TIME:  Radiation exposure index: 69 mGy reference air kerma COMPLICATIONS: None immediate. TECHNIQUE: Informed written consent was  obtained from the patient after a thorough discussion of the procedural risks, benefits and alternatives. All questions were addressed. Maximal Sterile Barrier Technique was utilized including caps, mask, sterile gowns, sterile gloves, sterile drape, hand hygiene and skin antiseptic. A timeout was performed prior to the initiation of the procedure. The right common femoral vein was interrogated with ultrasound and found to be widely patent. An image was obtained and stored for the medical record. Local anesthesia was attained by infiltration with 1% lidocaine. A small dermatotomy was made. Under real-time sonographic guidance, the vessel was punctured with a 21 gauge micropuncture needle. Using standard technique, the initial micro needle was exchanged over a 0.018 micro wire for a transitional 4 [place] micro sheath. The micro sheath was then exchanged over a 0.035 wire for a 6 French vascular sheath. Using the same technique, a second puncture of the right common femoral vein was performed and a second 6 [place] vascular sheath was placed. Working first through the more medial sheath, a 6 French angled pigtail catheter was advanced over a Bentson wire and navigated through the right heart and into the main pulmonary outflow tract. Arteriography was performed. Evidence of large volume bilateral PE with decreased perfusion to the pulmonary parenchyma. Unfortunately, at this time pressures could not be measured due to technical issues. An angled catheter was used to navigate into the left  lower lobe pulmonary artery. Limited left lower lobe pulmonary arteriography was performed. There is extensive clot burden with decreased perfusion. A rose in wire was placed. Next, the angled catheter was advanced into the right lower lobe pulmonary artery. Again, limited pulmonary arteriography was performed demonstrating large clot burden. A second rose in wire was placed. Next, 90 cm UniFuse infusion catheters were advanced. A 15  cm catheter was advanced into the right main pulmonary artery while a 10 cm infusion length catheter was advanced into the left pulmonary artery. 2 mg of tPA was then injected into each lysis catheter. The catheters were capped and connected to a tPA drip at 1 mg per hour per catheter. Both sheaths were secured to the skin with 0 silk suture. Sterile bandages were applied. The patient tolerated the procedure well. FINDINGS: Large volume bilateral pulmonary emboli. Pulmonary arterial pressures could not be obtained secondary to technical issues. IMPRESSION: Successful initiation of bilateral pulmonary arterial thrombolysis. Electronically Signed   By: Jacqulynn Cadet M.D.   On: 12/25/2021 11:37   IR Angiogram Selective Each Additional Vessel  Result Date: 12/25/2021 INDICATION: 71 year old male with intermediate high risk bilateral pulmonary emboli with significant right heart strain (acute cor pulmonale). He is a candidate for bilateral pulmonary arterial thrombolysis. EXAM: IR ULTRASOUND GUIDANCE VASC ACCESS RIGHT; ADDITIONAL ARTERIOGRAPHY; IR INFUSION THROMBOL ARTERIAL INITIAL (MS); BILATERAL PULMONARY ARTERIOGRAPHY COMPARISON:  CT arteriogram 12/24/2021 MEDICATIONS: None. ANESTHESIA/SEDATION: Versed 1 mg IV; Fentanyl 50 mcg IV Moderate Sedation Time:  43 minutes The patient's vital signs and level of consciousness were continuously monitored during the procedure by the interventional radiology nurse under my direct supervision. FLUOROSCOPY TIME:  Radiation exposure index: 69 mGy reference air kerma COMPLICATIONS: None immediate. TECHNIQUE: Informed written consent was obtained from the patient after a thorough discussion of the procedural risks, benefits and alternatives. All questions were addressed. Maximal Sterile Barrier Technique was utilized including caps, mask, sterile gowns, sterile gloves, sterile drape, hand hygiene and skin antiseptic. A timeout was performed prior to the initiation of the  procedure. The right common femoral vein was interrogated with ultrasound and found to be widely patent. An image was obtained and stored for the medical record. Local anesthesia was attained by infiltration with 1% lidocaine. A small dermatotomy was made. Under real-time sonographic guidance, the vessel was punctured with a 21 gauge micropuncture needle. Using standard technique, the initial micro needle was exchanged over a 0.018 micro wire for a transitional 4 [place] micro sheath. The micro sheath was then exchanged over a 0.035 wire for a 6 French vascular sheath. Using the same technique, a second puncture of the right common femoral vein was performed and a second 6 [place] vascular sheath was placed. Working first through the more medial sheath, a 6 French angled pigtail catheter was advanced over a Bentson wire and navigated through the right heart and into the main pulmonary outflow tract. Arteriography was performed. Evidence of large volume bilateral PE with decreased perfusion to the pulmonary parenchyma. Unfortunately, at this time pressures could not be measured due to technical issues. An angled catheter was used to navigate into the left lower lobe pulmonary artery. Limited left lower lobe pulmonary arteriography was performed. There is extensive clot burden with decreased perfusion. A rose in wire was placed. Next, the angled catheter was advanced into the right lower lobe pulmonary artery. Again, limited pulmonary arteriography was performed demonstrating large clot burden. A second rose in wire was placed. Next, 90 cm UniFuse infusion catheters were advanced.  A 15 cm catheter was advanced into the right main pulmonary artery while a 10 cm infusion length catheter was advanced into the left pulmonary artery. 2 mg of tPA was then injected into each lysis catheter. The catheters were capped and connected to a tPA drip at 1 mg per hour per catheter. Both sheaths were secured to the skin with 0 silk  suture. Sterile bandages were applied. The patient tolerated the procedure well. FINDINGS: Large volume bilateral pulmonary emboli. Pulmonary arterial pressures could not be obtained secondary to technical issues. IMPRESSION: Successful initiation of bilateral pulmonary arterial thrombolysis. Electronically Signed   By: Jacqulynn Cadet M.D.   On: 12/25/2021 11:37   CT Angio Chest PE W and/or Wo Contrast  Result Date: 12/24/2021 CLINICAL DATA:  Suspected pulmonary embolism in a 71 year old male. EXAM: CT ANGIOGRAPHY CHEST WITH CONTRAST TECHNIQUE: Multidetector CT imaging of the chest was performed using the standard protocol during bolus administration of intravenous contrast. Multiplanar CT image reconstructions and MIPs were obtained to evaluate the vascular anatomy. RADIATION DOSE REDUCTION: This exam was performed according to the departmental dose-optimization program which includes automated exposure control, adjustment of the mA and/or kV according to patient size and/or use of iterative reconstruction technique. CONTRAST:  33m OMNIPAQUE IOHEXOL 350 MG/ML SOLN COMPARISON:  Chest CT of February 2019. FINDINGS: Cardiovascular: Main pulmonary artery is opacified to 552 Hounsfield units. There are bilateral LEFT and RIGHT pulmonary arterial emboli which extend into all segmental branches on the RIGHT with occlusive thrombotic changes in RIGHT lower lobe pulmonary artery. Pulmonary emboli also extending throughout the LEFT lobar and segmental branches with occlusive thrombus in segmental branch to the LEFT lower lobe laterally resulting in a focus of airspace disease compatible with pulmonary infarct. Elevated RV to LV ratio to 2.12. Marked RIGHT heart enlargement in the setting of RIGHT heart strain. No pericardial effusion or signs of pericardial nodularity. Aortic caliber is normal. Aorta not well assessed due to phase of contrast enhancement, maximize for evaluation of pulmonary vascular bed.  Mediastinum/Nodes: No thoracic inlet, axillary, mediastinal or hilar adenopathy. Esophagus grossly normal. Lungs/Pleura: Patchy ground-glass opacities in addition to signs of pulmonary infarct the in the LEFT lower lobe. No lobar consolidative process. No pneumothorax. No pleural effusion. Airways are patent. Upper Abdomen: Incidental imaging of upper abdominal contents shows no acute process related to visualized portions the liver, gallbladder, pancreas, spleen, adrenal glands or kidneys. Nephrolithiasis in the upper pole the LEFT kidney partially visualized, at least 5 mm calculus in the upper pole. No upper abdominal lymphadenopathy. Musculoskeletal: Redemonstration of compression fractures at T11 and T12. Slight increased loss of height at T11 with slightly greater than 50% loss of height at this level. Post cement augmentation at T12 as before. Loss of height at L1 is unchanged. Review of the MIP images confirms the above findings. IMPRESSION: 1. Bilateral LEFT and RIGHT pulmonary arterial emboli which extend into all lobar and segmental branches on the RIGHT with occlusive thrombosis in RIGHT lower lobe pulmonary artery. Extensive LEFT pulmonary artery emboli extending into segmental and lobar level branches as well with occlusive thrombus in segmental branches of the LEFT lower lobe. Positive for acute PE with CT evidence of marked right heart strain (RV/LV Ratio = 2.12) consistent with at least submassive (intermediate risk) PE. The presence of right heart strain has been associated with an increased risk of morbidity and mortality. Please refer to the "Code PE Focused" order set in EPIC. 2. 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 3. Interval loss of height at T11 at the site of previous compression fracture. Correlate with any new or recent worsening of. Symptoms in this area Findings of pulmonary emboli and RIGHT heart strain were related to the provider is outlined below. Critical  Value/emergent results were called by telephone at the time of interpretation on 12/24/2021 at 12:35 pm to provider Regan Lemming , who verbally acknowledged these results. Aortic Atherosclerosis (ICD10-I70.0). Electronically Signed   By: Zetta Bills M.D.   On: 12/24/2021 12:50   US Venous Img Lower Bilateral  Result Date: 12/24/2021 CLINICAL DATA:  BILATERAL leg swelling RIGHT greater than LEFT, history of DVT last summer EXAM: BILATERAL LOWER EXTREMITY VENOUS DOPPLER ULTRASOUND TECHNIQUE: Gray-scale sonography with graded compression, as well as color Doppler and duplex ultrasound were performed to evaluate the lower extremity deep venous systems from the level of the common femoral vein and including the common femoral, femoral, profunda femoral, popliteal and calf veins including the posterior tibial, peroneal and gastrocnemius veins when visible. The superficial great saphenous vein was also interrogated. Spectral Doppler was utilized to evaluate flow at rest and with distal augmentation maneuvers in the common femoral, femoral and popliteal veins. COMPARISON:  11/11/2016 FINDINGS: RIGHT LOWER EXTREMITY Common Femoral Vein: No evidence of thrombus. Normal compressibility, respiratory phasicity and response to augmentation. Saphenofemoral Junction: No evidence of thrombus. Normal compressibility and flow on color Doppler imaging. Profunda Femoral Vein: No evidence of thrombus. Normal compressibility and flow on color Doppler imaging. Femoral Vein: Hypoechoic intraluminal thrombus present. Impaired spontaneous venous flow and compressibility Popliteal Vein: Hypoechoic intraluminal thrombus present. Small portion of the thrombus in the RIGHT popliteal vein is slightly more echoic and age indeterminate. Impaired spontaneous venous flow and compressibility. Calf Veins: Intraluminal thrombus with impaired spontaneous venous flow and compressibility Superficial Great Saphenous Vein: No evidence of thrombus.  Normal compressibility. Venous Reflux:  None. Other Findings:  None. LEFT LOWER EXTREMITY Common Femoral Vein: No evidence of thrombus. Normal compressibility, respiratory phasicity and response to augmentation. Saphenofemoral Junction: No evidence of thrombus. Normal compressibility and flow on color Doppler imaging. Profunda Femoral Vein: No evidence of thrombus. Normal compressibility and flow on color Doppler imaging. Femoral Vein: No evidence of thrombus. Normal compressibility, respiratory phasicity and response to augmentation. Popliteal Vein: No evidence of thrombus. Normal compressibility, respiratory phasicity and response to augmentation. Calf Veins: No evidence of thrombus. Normal compressibility and flow on color Doppler imaging. Superficial Great Saphenous Vein: No evidence of thrombus. Normal compressibility. Venous Reflux:  None. Other Findings:  None. IMPRESSION: Positive exam for presence of deep venous thrombosis in the RIGHT femoral and popliteal veins extending into RIGHT calf. A small portion of the thrombus in the RIGHT popliteal vein is slightly more echoic and age-indeterminate, with remainder hypoechoic question acute. No evidence of deep venous thrombosis in the LEFT lower extremity. Electronically Signed   By: Lavonia Dana M.D.   On: 12/24/2021 08:49   DG Chest 2 View  Result Date: 12/24/2021 CLINICAL DATA:  Shortness of breath. Productive cough. Concern for pneumonia. EXAM: CHEST - 2 VIEW COMPARISON:  AP chest 12/12/2021, chest two views 07/03/2017; CT chest 05/23/2017 FINDINGS: Cardiac silhouette at the upper limits of normal size for AP technique. Limited evaluation of mediastinal contours by low lung volumes and AP technique. Mildly decreased lung volumes. Linear and heterogeneous airspace opacification of the left lower lung. The right lung is clear. No definite pleural effusion. No pneumothorax. Mild multilevel degenerative disc changes of the thoracic spine. Vertebral  augmentation cement at the approximate T12 vertebral body level. Moderate anterior approximate T11, mild anterior approximate T12, and minimal anterior approximate L1 vertebral body height loss. IMPRESSION: 1. Mildly decreased lung volumes with left basilar airspace opacification may represent atelectasis versus pneumonia. 2. Mild anterior height loss of multiple lower thoracic and upper lumbar vertebral bodies. Electronically Signed   By: Yvonne Kendall M.D.   On: 12/24/2021 08:14   DG Chest Portable 1 View  Result Date: 12/12/2021 CLINICAL DATA:  Shortness of breath.  History of pulmonary embolism. EXAM: PORTABLE CHEST 1 VIEW COMPARISON:  Radiographs 09/23/2019 and 07/03/2017.  CT 05/23/2017. FINDINGS: 0950 hours. Lower lung volumes with mildly increased left basilar atelectasis. The lungs are otherwise clear. There is no pleural effusion or pneumothorax. The heart size and mediastinal contours are stable. No acute osseous findings are seen. Evidence of lower thoracic spinal augmentation. Telemetry leads overlie the chest. IMPRESSION: Mild left basilar atelectasis. No other evidence of active cardiopulmonary process. Electronically Signed   By: Richardean Sale M.D.   On: 12/12/2021 09:57     TODAY-DAY OF DISCHARGE:  Subjective:   David Armstrong today has no headache,no chest abdominal pain,no new weakness tingling or numbness, feels much better wants to go home today.   Objective:   Blood pressure (!) 92/54, pulse 87, temperature 97.9 F (36.6 C), temperature source Oral, resp. rate 11, height '6\' 1"'$  (1.854 m), weight 71.2 kg, SpO2 93 %.  Intake/Output Summary (Last 24 hours) at 12/27/2021 0935 Last data filed at 12/27/2021 0400 Gross per 24 hour  Intake 942.95 ml  Output 1200 ml  Net -257.05 ml   Filed Weights   12/24/21 0716  Weight: 71.2 kg    Exam: Awake Alert, Oriented *3, No new F.N deficits, Normal affect [place].AT,PERRAL Supple Neck,No JVD, No cervical lymphadenopathy appriciated.   Symmetrical Chest wall movement, Good air movement bilaterally, CTAB RRR,No Gallops,Rubs or new Murmurs, No Parasternal Heave +ve B.Sounds, Abd Soft, Non tender, No organomegaly appriciated, No rebound -guarding or rigidity. No Cyanosis, Clubbing or edema, No new Rash or bruise   PERTINENT RADIOLOGIC STUDIES: ECHOCARDIOGRAM COMPLETE  Result Date: 12/25/2021    ECHOCARDIOGRAM REPORT   Patient Name:   David Armstrong Date of Exam: 12/25/2021 Medical Rec #:  [id number]        Height:       73.0 in Accession #:    [id number]       Weight:       157.0 lb Date of Birth:  05/08/1950        BSA:          1.941 m Patient Age:    68 years         BP:           103/74 mmHg Patient Gender: M                HR:           77 bpm. Exam Location:  Inpatient Procedure: 2D Echo Indications:    pulmonary embolus  History:        Patient has prior history of Echocardiogram examinations, most                 recent 02/13/2021. Signs/Symptoms:elevated troponin.  Sonographer:    Johny Chess RDCS Referring Phys: [id number] [place]  1. Left ventricular ejection fraction, by estimation, is 55 to 60%. The left ventricle has normal function. The left ventricle has no regional wall motion  abnormalities. Left ventricular diastolic parameters are consistent with Grade I diastolic dysfunction (impaired relaxation).  2. Right ventricular systolic function is normal. The right ventricular size is mildly enlarged. Tricuspid regurgitation signal is inadequate for assessing PA pressure.  3. Left atrial size was mildly dilated.  4. The mitral valve is normal in structure. Trivial mitral valve regurgitation. No evidence of mitral stenosis.  5. The aortic valve is tricuspid. There is mild calcification of the aortic valve. Aortic valve regurgitation is not visualized. No aortic stenosis is present.  6. Aortic dilatation noted. There is mild dilatation of the aortic root, measuring 38 mm.  7. The inferior vena cava is  normal in size with greater than 50% respiratory variability, suggesting right atrial pressure of 3 mmHg. FINDINGS  Left Ventricle: Left ventricular ejection fraction, by estimation, is 55 to 60%. The left ventricle has normal function. The left ventricle has no regional wall motion abnormalities. The left ventricular internal cavity size was normal in size. There is  no left ventricular hypertrophy. Left ventricular diastolic parameters are consistent with Grade I diastolic dysfunction (impaired relaxation). Right Ventricle: The right ventricular size is mildly enlarged. No increase in right ventricular wall thickness. Right ventricular systolic function is normal. Tricuspid regurgitation signal is inadequate for assessing PA pressure. Left Atrium: Left atrial size was mildly dilated. Right Atrium: Right atrial size was normal in size. Pericardium: There is no evidence of pericardial effusion. Mitral Valve: The mitral valve is normal in structure. There is mild calcification of the mitral valve leaflet(s). Trivial mitral valve regurgitation. No evidence of mitral valve stenosis. Tricuspid Valve: The tricuspid valve is normal in structure. Tricuspid valve regurgitation is not demonstrated. Aortic Valve: The aortic valve is tricuspid. There is mild calcification of the aortic valve. Aortic valve regurgitation is not visualized. No aortic stenosis is present. Pulmonic Valve: The pulmonic valve was normal in structure. Pulmonic valve regurgitation is trivial. Aorta: Aortic dilatation noted. There is mild dilatation of the aortic root, measuring 38 mm. Venous: The inferior vena cava is normal in size with greater than 50% respiratory variability, suggesting right atrial pressure of 3 mmHg. IAS/Shunts: No atrial level shunt detected by color flow Doppler.  LEFT VENTRICLE PLAX 2D LVIDd:         4.80 cm   Diastology LVIDs:         3.00 cm   LV e' medial:    6.64 cm/s LV PW:         0.80 cm   LV E/e' medial:  10.9 LV IVS:         0.70 cm   LV e' lateral:   7.62 cm/s LVOT diam:     2.30 cm   LV E/e' lateral: 9.5 LV SV:         77 LV SV Index:   40 LVOT Area:     4.15 cm  RIGHT VENTRICLE             IVC RV S prime:     13.60 cm/s  IVC diam: 2.00 cm LEFT ATRIUM             Index        RIGHT ATRIUM           Index LA diam:        3.60 cm 1.85 cm/m   RA Area:     13.00 cm LA Vol (A2C):   66.2 ml 34.10 ml/m  RA Volume:   29.10 ml  14.99  ml/m LA Vol (A4C):   52.2 ml 26.89 ml/m LA Biplane Vol: 59.5 ml 30.65 ml/m  AORTIC VALVE LVOT Vmax:   96.40 cm/s LVOT Vmean:  63.600 cm/s LVOT VTI:    0.185 m  AORTA Ao Root diam: 3.80 cm Ao Asc diam:  3.20 cm MITRAL VALVE MV Area (PHT): 4.06 cm    SHUNTS MV Decel Time: 187 msec    Systemic VTI:  0.18 m MV E velocity: 72.40 cm/s  Systemic Diam: 2.30 cm MV A velocity: 79.80 cm/s MV E/A ratio:  0.91 Dalton McleanMD Electronically signed by Franki Monte Signature Date/Time: 12/25/2021/3:29:52 PM    Final    IR THROMB F/U EVAL ART/VEN FINAL DAY (MS)  Result Date: 12/25/2021 INDICATION: History of sub massive pulmonary embolism, post initiation of bilateral pulmonary arterial thrombolysis on 12/24/2021. Patient has completed prescribed course of bilateral pulmonary arterial lytic infusion and presents today for repeat pulmonary arterial pressure measurements prior to infusion catheter removal. EXAM: PULMONARY ARTERIAL PRESSURE MEASUREMENT AND INFUSION CATHETER(S) REMOVAL COMPARISON:  Initiation of bilateral catheter directed pulmonary arterial thrombolysis-12/24/2021 Chest CTA-12/24/2021 MEDICATIONS: None CONTRAST:  None FLUOROSCOPY TIME:  None COMPLICATIONS: None immediate. TECHNIQUE: The patient was positioned supine on his hospital bed. The right pulmonary arterial catheter's infusion wire was removed and the multi side-hole infusion catheter was retracted to the estimated location of the main pulmonary artery. Pressure measurements were acquired from this location. At this point, the procedure  was terminated. All wires, catheters and sheaths were removed from the patient. Hemostasis was achieved at the right groin access site with manual compression. A dressing was placed. The patient tolerated the procedure well without immediate postprocedural complication. FINDINGS: Acquired pressure measurements as follows: Preprocedural main pulmonary artery - unable to be obtained due to technical issues. Postprocedural main pulmonary artery - 22/11; mean - 15 IMPRESSION: Technically successful bilateral catheter directed pulmonary arterial thrombolysis with normal postprocedural pulmonary arterial pressure measurements following 12 hour lysis infusion. Electronically Signed   By: Sandi Mariscal M.D.   On: 12/25/2021 13:39     PERTINENT LAB RESULTS: CBC: Recent Labs    12/26/21 0628 12/27/21 0636  WBC 6.6 5.0  HGB 12.1* 12.0*  HCT 35.0* 34.6*  PLT 132* 128*   CMET CMP     Component Value Date/Time   NA 140 12/25/2021 0039   K 4.8 12/25/2021 0039   CL 110 12/25/2021 0039   CO2 22 12/25/2021 0039   GLUCOSE 115 (H) 12/25/2021 0039   BUN 13 12/25/2021 0039   CREATININE 0.70 12/25/2021 0039   CREATININE 0.92 11/02/2019 1038   CALCIUM 8.0 (L) 12/25/2021 0039   PROT 5.2 (L) 12/25/2021 0039   ALBUMIN 2.4 (L) 12/25/2021 0039   AST 25 12/25/2021 0039   ALT 21 12/25/2021 0039   ALKPHOS 68 12/25/2021 0039   BILITOT 0.9 12/25/2021 0039   GFRNONAA >60 12/25/2021 0039   GFRNONAA >89 03/17/2016 0922   GFRAA >60 09/23/2019 1554   GFRAA >89 03/17/2016 0922    GFR Estimated Creatinine Clearance: 86.5 mL/min (by C-G formula based on SCr of 0.7 mg/dL). No results for input(s): "LIPASE", "AMYLASE" in the last 72 hours. No results for input(s): "CKTOTAL", "CKMB", "CKMBINDEX", "TROPONINI" in the last 72 hours. Invalid input(s): "POCBNP" No results for input(s): "DDIMER" in the last 72 hours. No results for input(s): "HGBA1C" in the last 72 hours. No results for input(s): "CHOL", "HDL", "LDLCALC",  "TRIG", "CHOLHDL", "LDLDIRECT" in the last 72 hours. No results for input(s): "TSH", "T4TOTAL", "T3FREE", "THYROIDAB"  in the last 72 hours.  Invalid input(s): "FREET3" No results for input(s): "VITAMINB12", "FOLATE", "FERRITIN", "TIBC", "IRON", "RETICCTPCT" in the last 72 hours. Coags: Recent Labs    12/24/21 1303  INR 1.3*   Microbiology: Recent Results (from the past 240 hour(s))  Resp Panel by RT-PCR (Flu A&B, Covid) Anterior Nasal Swab     Status: None   Collection Time: 12/24/21  7:48 AM   Specimen: Anterior Nasal Swab  Result Value Ref Range Status   SARS Coronavirus 2 by RT PCR NEGATIVE NEGATIVE Final    Comment: (NOTE) SARS-CoV-2 target nucleic acids are NOT DETECTED.  The SARS-CoV-2 RNA is generally detectable in upper respiratory specimens during the acute phase of infection. The lowest concentration of SARS-CoV-2 viral copies this assay can detect is 138 copies/mL. A negative result does not preclude SARS-Cov-2 infection and should not be used as the sole basis for treatment or other patient management decisions. A negative result may occur with  improper specimen collection/handling, submission of specimen other than nasopharyngeal swab, presence of viral mutation(s) within the areas targeted by this assay, and inadequate number of viral copies(<138 copies/mL). A negative result must be combined with clinical observations, patient history, and epidemiological information. The expected result is Negative.  Fact Sheet for Patients:  EntrepreneurPulse.com.au  Fact Sheet for Healthcare Providers:  IncredibleEmployment.be  This test is no t yet approved or cleared by the [place] FDA and  has been authorized for detection and/or diagnosis of SARS-CoV-2 by FDA under an Emergency Use Authorization (EUA). This EUA will remain  in effect (meaning this test can be used) for the duration of the COVID-19 declaration under Section  564(b)(1) of the Act, 21 U.S.C.section 360bbb-3(b)(1), unless the authorization is terminated  or revoked sooner.       Influenza A by PCR NEGATIVE NEGATIVE Final   Influenza B by PCR NEGATIVE NEGATIVE Final    Comment: (NOTE) The Xpert Xpress SARS-CoV-2/FLU/RSV plus assay is intended as an aid in the diagnosis of influenza from Nasopharyngeal swab specimens and should not be used as a sole basis for treatment. Nasal washings and aspirates are unacceptable for Xpert Xpress SARS-CoV-2/FLU/RSV testing.  Fact Sheet for Patients: EntrepreneurPulse.com.au  Fact Sheet for Healthcare Providers: IncredibleEmployment.be  This test is not yet approved or cleared by the [place] FDA and has been authorized for detection and/or diagnosis of SARS-CoV-2 by FDA under an Emergency Use Authorization (EUA). This EUA will remain in effect (meaning this test can be used) for the duration of the COVID-19 declaration under Section 564(b)(1) of the Act, 21 U.S.C. section 360bbb-3(b)(1), unless the authorization is terminated or revoked.  Performed at [facility], [address], [place], [place] [address]   MRSA Next Gen by PCR, Nasal     Status: None   Collection Time: 12/25/21  8:09 PM   Specimen: Nasal Mucosa; Nasal Swab  Result Value Ref Range Status   MRSA by PCR Next Gen NOT DETECTED NOT DETECTED Final    Comment: (NOTE) The GeneXpert MRSA Assay (FDA approved for NASAL specimens only), is one component of a comprehensive MRSA colonization surveillance program. It is not intended to diagnose MRSA infection nor to guide or monitor treatment for MRSA infections. Test performance is not FDA approved in patients less than 68 years old. Performed at [facility], [place] [address], [place], [place] [address]     FURTHER DISCHARGE INSTRUCTIONS:  Get Medicines reviewed and adjusted: Please take all your medications with you  for  your next visit with your Primary MD  Laboratory/radiological data: Please request your Primary MD to go over all hospital tests and procedure/radiological results at the follow up, please ask your Primary MD to get all Hospital records sent to his/her office.  In some cases, they will be blood work, cultures and biopsy results pending at the time of your discharge. Please request that your primary care M.D. goes through all the records of your hospital data and follows up on these results.  Also Note the following: If you experience worsening of your admission symptoms, develop shortness of breath, life threatening emergency, suicidal or homicidal thoughts you must seek medical attention immediately by calling 911 or calling your MD immediately  if symptoms less severe.  You must read complete instructions/literature along with all the possible adverse reactions/side effects for all the Medicines you take and that have been prescribed to you. Take any new Medicines after you have completely understood and accpet all the possible adverse reactions/side effects.   Do not drive when taking Pain medications or sleeping medications (Benzodaizepines)  Do not take more than prescribed Pain, Sleep and Anxiety Medications. It is not advisable to combine anxiety,sleep and pain medications without talking with your primary care practitioner  Special Instructions: If you have smoked or chewed Tobacco  in the last 2 yrs please stop smoking, stop any regular Alcohol  and or any Recreational drug use.  Wear Seat belts while driving.  Please note: You were cared for by a hospitalist during your hospital stay. Once you are discharged, your primary care physician will handle any further medical issues. Please note that NO REFILLS for any discharge medications will be authorized once you are discharged, as it is imperative that you return to your primary care physician (or establish a relationship with a primary  care physician if you do not have one) for your post hospital discharge needs so that they can reassess your need for medications and monitor your lab values.  Total Time spent coordinating discharge including counseling, education and face to face time equals greater than 30 minutes.  SignedOren Binet 12/27/2021 9:35 AM

## 2021-12-27 NOTE — Discharge Instructions (Addendum)
Information on my medicine - XARELTO (rivaroxaban)  This medication education was reviewed with me or my healthcare representative as part of my discharge preparation.   WHY WAS XARELTO PRESCRIBED FOR YOU? Xarelto was prescribed to treat blood clots that may have been found in the veins of your legs (deep vein thrombosis) or in your lungs (pulmonary embolism) and to reduce the risk of them occurring again.  What do you need to know about Xarelto? The starting dose is one 15 mg tablet taken TWICE daily with food for the FIRST 21 DAYS then on 01/26/22   the dose is changed to one 20 mg tablet taken ONCE A DAY with your evening meal.  DO NOT stop taking Xarelto without talking to the health care provider who prescribed the medication.  Refill your prescription for 20 mg tablets before you run out.  After discharge, you should have regular check-up appointments with your healthcare provider that is prescribing your Xarelto.  In the future your dose may need to be changed if your kidney function changes by a significant amount.  What do you do if you miss a dose? If you are taking Xarelto TWICE DAILY and you miss a dose, take it as soon as you remember. You may take two 15 mg tablets (total 30 mg) at the same time then resume your regularly scheduled 15 mg twice daily the next day.  If you are taking Xarelto ONCE DAILY and you miss a dose, take it as soon as you remember on the same day then continue your regularly scheduled once daily regimen the next day. Do not take two doses of Xarelto at the same time.   Important Safety Information Xarelto is a blood thinner medicine that can cause bleeding. You should call your healthcare provider right away if you experience any of the following: Bleeding from an injury or your nose that does not stop. Unusual colored urine (red or dark brown) or unusual colored stools (red or black). Unusual bruising for unknown reasons. A serious fall or if you  hit your head (even if there is no bleeding).  Some medicines may interact with Xarelto and might increase your risk of bleeding while on Xarelto. To help avoid this, consult your healthcare provider or pharmacist prior to using any new prescription or non-prescription medications, including herbals, vitamins, non-steroidal anti-inflammatory drugs (NSAIDs) and supplements.  This website has more information on Xarelto: [email or web address]

## 2021-12-27 NOTE — Evaluation (Signed)
Occupational Therapy Evaluation Patient Details Name: David Armstrong MRN: [id number] DOB: Dec 10, 1950 Today's Date: 12/27/2021   History of Present Illness Pt is a 71 y.o. male admitted 12/24/21 with exertional dyspnea. Workup for acute hypoxic respiratory failure secondary to submassive PE, RLE DVT. S/p catheter-guided lytic therapy 9/12. PMH includes VTE (noncompliant with Xarelto), DVT/PE, GERD, HLD, peripheral neuropathy, bipolar disorder, arthritis, anxiety, depression.   Clinical Impression   Pt is functioning at a set up to supervision level. Educated pt in energy conservation strategies and reinforced importance of taking his medication. Pt verbalized understanding. No further OT needs.      Recommendations for follow up therapy are one component of a multi-disciplinary discharge planning process, led by the attending physician.  Recommendations may be updated based on patient status, additional functional criteria and insurance authorization.   Follow Up Recommendations  No OT follow up    Assistance Recommended at Discharge Intermittent Supervision/Assistance  Patient can return home with the following Assist for transportation    Functional Status Assessment  Patient has had a recent decline in their functional status and demonstrates the ability to make significant improvements in function in a reasonable and predictable amount of time.  Equipment Recommendations  None recommended by OT    Recommendations for Other Services       Precautions / Restrictions Restrictions Weight Bearing Restrictions: No      Mobility Bed Mobility Overal bed mobility: Modified Independent                  Transfers Overall transfer level: Needs assistance Equipment used: None Transfers: Sit to/from Stand Sit to Stand: Supervision                  Balance Overall balance assessment: Needs assistance   Sitting balance-Leahy Scale: Good       Standing  balance-Leahy Scale: Good                             ADL either performed or assessed with clinical judgement   ADL                                         General ADL Comments: supervised for lines and safety, educated in energy conservation strategies     Vision Baseline Vision/History: 1 Wears glasses Ability to See in Adequate Light: 0 Adequate Patient Visual Report: No change from baseline       Perception     Praxis      Pertinent Vitals/Pain Pain Assessment Pain Assessment: Faces Faces Pain Scale: Hurts little more Pain Location: back Pain Descriptors / Indicators: Sore, Discomfort Pain Intervention(s): Monitored during session, Repositioned     Hand Dominance Right   Extremity/Trunk Assessment Upper Extremity Assessment Upper Extremity Assessment: Overall WFL for tasks assessed   Lower Extremity Assessment Lower Extremity Assessment: Defer to PT evaluation   Cervical / Trunk Assessment Cervical / Trunk Assessment: Normal   Communication Communication Communication: No difficulties   Cognition Arousal/Alertness: Awake/alert Behavior During Therapy: Flat affect Overall Cognitive Status: Within Functional Limits for tasks assessed                                       General Comments  Exercises     Shoulder Instructions      Home Living Family/patient expects to be discharged to:: Private residence Living Arrangements: Alone Available Help at Discharge: Family;Available PRN/intermittently Type of Home: House Home Access: Stairs to enter [employer] of Steps: 3-6   Home Layout: One level     Bathroom Shower/Tub: Occupational psychologist: Standard     Home Equipment: Civil engineer, contracting - built in          Prior Functioning/Environment Prior Level of Function : Independent/Modified Independent;Driving                        OT Problem List:        OT  Treatment/Interventions:      OT Goals(Current goals can be found in the care plan section)    OT Frequency:      Co-evaluation              AM-PAC OT "6 Clicks" Daily Activity     Outcome Measure Help from another person eating meals?: None Help from another person taking care of personal grooming?: None Help from another person toileting, which includes using toliet, bedpan, or urinal?: None Help from another person bathing (including washing, rinsing, drying)?: None Help from another person to put on and taking off regular upper body clothing?: None Help from another person to put on and taking off regular lower body clothing?: None 6 Click Score: 24   End of Session    Activity Tolerance: Patient tolerated treatment well Patient left: in bed;with call bell/phone within reach  OT Visit Diagnosis: Muscle weakness (generalized) (M62.81)                Time: 5697-9480 OT Time Calculation (min): 24 min Charges:  OT General Charges $OT Visit: 1 Visit OT Evaluation $OT Eval Low Complexity: 1 Low OT Treatments $Self Care/Home Management : 8-22 mins  Cleta Alberts, OTR/L Acute Rehabilitation Services Office: [phone]   Malka So 12/27/2021, 9:15 AM

## 2021-12-30 ENCOUNTER — Telehealth: Payer: Self-pay

## 2021-12-30 ENCOUNTER — Encounter: Payer: Self-pay | Admitting: Family Medicine

## 2021-12-30 ENCOUNTER — Ambulatory Visit (INDEPENDENT_AMBULATORY_CARE_PROVIDER_SITE_OTHER): Payer: Medicare Other | Admitting: Family Medicine

## 2021-12-30 ENCOUNTER — Other Ambulatory Visit: Payer: Self-pay

## 2021-12-30 VITALS — BP 130/86 | HR 92 | Temp 98.2°F | Ht 73.0 in | Wt 149.8 lb

## 2021-12-30 DIAGNOSIS — I2602 Saddle embolus of pulmonary artery with acute cor pulmonale: Secondary | ICD-10-CM | POA: Diagnosis not present

## 2021-12-30 DIAGNOSIS — S22000A Wedge compression fracture of unspecified thoracic vertebra, initial encounter for closed fracture: Secondary | ICD-10-CM | POA: Diagnosis not present

## 2021-12-30 LAB — COMPREHENSIVE METABOLIC PANEL
ALT: 27 U/L (ref 0–53)
AST: 21 U/L (ref 0–37)
Albumin: 3.5 g/dL (ref 3.5–5.2)
Alkaline Phosphatase: 78 U/L (ref 39–117)
BUN: 24 mg/dL — ABNORMAL HIGH (ref 6–23)
CO2: 24 mEq/L (ref 19–32)
Calcium: 8.9 mg/dL (ref 8.4–10.5)
Chloride: 105 mEq/L (ref 96–112)
Creatinine, Ser: 0.83 mg/dL (ref 0.40–1.50)
GFR: 88.51 mL/min (ref >60.00)
Glucose, Bld: 87 mg/dL (ref 70–99)
Potassium: 4.4 mEq/L (ref 3.5–5.1)
Sodium: 139 mEq/L (ref 135–145)
Total Bilirubin: 0.8 mg/dL (ref 0.2–1.2)
Total Protein: 6.7 g/dL (ref 6.0–8.3)

## 2021-12-30 LAB — CBC
HCT: 40.9 % (ref 39.0–52.0)
Hemoglobin: 14 g/dL (ref 13.0–17.0)
MCHC: 34.1 g/dL (ref 30.0–36.0)
MCV: 89.5 fl (ref 78.0–100.0)
Platelets: 210 10*3/uL (ref 150.0–400.0)
RBC: 4.57 Mil/uL (ref 4.22–5.81)
RDW: 14.7 % (ref 11.5–15.5)
WBC: 8.1 10*3/uL (ref 4.0–10.5)

## 2021-12-30 NOTE — Telephone Encounter (Signed)
Transition Care Management Unsuccessful Follow-up Telephone Call  Date of discharge and from where:  [facility] 12/27/21  Attempts:  1st Attempt  Reason for unsuccessful TCM follow-up call:  Left voice message

## 2021-12-30 NOTE — Assessment & Plan Note (Signed)
Found to have interval loss of height at T11 on recent CT scan at the ED. He will follow up with orthopedics soon.

## 2021-12-30 NOTE — Patient Instructions (Addendum)
It was very nice to see you today!  We will check blood work today.  Please continue your current medications.  Take care, Dr [name]  PLEASE NOTE:  If you had any lab tests please let us know if you have not heard back within a few days. You may see your results on mychart before we have a chance to review them but we will give you a call once they are reviewed by [place]. If we ordered any referrals today, please let us know if you have not heard from their office within the next week.   Please try these tips to maintain a healthy lifestyle:  Eat at least 3 REAL meals and 1-2 snacks per day.  Aim for no more than 5 hours between eating.  If you eat breakfast, please do so within one hour of getting up.   Each meal should contain half fruits/vegetables, one quarter protein, and one quarter carbs (no bigger than a computer mouse)  Cut down on sweet beverages. This includes juice, soda, and sweet tea.   Drink at least 1 glass of water with each meal and aim for at least 8 glasses per day  Exercise at least 150 minutes every week.

## 2021-12-30 NOTE — Assessment & Plan Note (Signed)
Currently anticoagulated on xarelto. He has been compliant with this. Will need lifelong anticoagulation going forward given history of multiple PEs.

## 2021-12-30 NOTE — Telephone Encounter (Signed)
Transitions of Care Pharmacy   Call attempted for a pharmacy transitions of care follow-up. HIPAA appropriate voicemail was left with call back information provided.   Call attempt #1. Will follow-up in 2-3 days.   Rehan Holness E. Marsh, PharmD PGY-1 Community Pharm[facility] Resident    

## 2021-12-30 NOTE — Progress Notes (Signed)
Chief Complaint:  David Armstrong is a 71 y.o. male who presents today for a TCM visit.  Assessment/Plan:  New/Acute Problems: Thoracic Back Pain Potentially related to his compression fractures vs pulmonary infarct. Reassuring exam today. He will follow up with his orthopedist soon.   Chronic Problems Addressed Today: Pulmonary emboli ([place]) Currently anticoagulated on xarelto. He has been compliant with this. Will need lifelong anticoagulation going forward given history of multiple PEs.   Compression fracture of body of thoracic vertebra (HCC) Found to have interval loss of height at T11 on recent CT scan at the ED. He will follow up with orthopedics soon.     Subjective:  HPI:  Summary of Hospital admission: Reason for admission: PE Date of admission: 12/24/2021 Date of discharge: 9/15//2023 Date of Interactive contact: N/A, today is with 2 business days of discharge Summary of Hospital course: Patient presented to the ED with exertional dyspnea.  Was found to have submassive PE.  Underwent catheter guided lytic therapy which worked well.  He was restarted on Xarelto which she had been off of for about 4 weeks and discharged home.  Interim history:  Since being home he has been doing well. He does have some left thoracic back pain that has been managed with tramadol. Breathing has improved. HE has been been compliant with his medications without missed doses.   ROS:Per HPI, otherwise a complete review of systems was negative.   PMH:  The following were reviewed and entered/updated in epic: Past Medical History:  Diagnosis Date   Anxiety    Arthritis    "lower back; hips;' right knee"    Bipolar disorder ([place])    Chronic constipation    Depression    Diverticulosis of colon    Edema of both lower extremities    ANKLES   Fatty liver    GERD (gastroesophageal reflux disease)    Hiatal hernia    History of anal fissures    History of deep vein thrombosis (DVT) of  lower extremity 02/11/2016   right lower extremity distal and proximal extensive acute   History of Helicobacter pylori infection 2012; 2009   History of kidney stones    History of pneumonia 01/2015   CAP   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    Hyperlipidemia    IBS (irritable bowel syndrome)    Left ureteral stone    Nocturia    Peripheral neuropathy    per pt due to back DDD   Personal history of colonic polyps    HYPERPLASTIC POLYP   Pulmonary nodule, right    incidental finding CT chest angio 02-11-2016 stable   TMJ (temporomandibular joint syndrome)    Varicosities of leg    Patient Active Problem List   Diagnosis Date Noted   Pulmonary emboli ([place]) 12/24/2021   Pressure injury of skin 12/24/2021   Osteoporosis 08/30/2021   Compression fracture of body of thoracic vertebra (HCC) 07/29/2021   Venous stasis dermatitis 06/17/2021   Pain in joint of right knee 04/10/2021   Sleep-disordered breathing 11/30/2020   Skin lesion 11/30/2020   Headache 10/10/2020   Cataracts, bilateral 07/18/2020   Osteoarthritis 06/07/2020   DVT (deep venous thrombosis) ([place]) 02/16/2020   Leg edema 01/13/2020   Internal hemorrhoids 02/24/2018   Dyslipidemia 02/05/2018   Seborrheic dermatitis 09/14/2017   Inguinal hernia 06/05/2017   Allergic rhinitis 04/23/2017   Lumbar degenerative disc disease 07/31/2016   History of pulmonary embolism and  DVT (Oct 2017) 02/11/2016   ETD (Eustachian tube dysfunction), bilateral 02/04/2016   Chronic pansinusitis 12/19/2015   Cough, persistent 12/19/2015   Nephrolithiasis 04/19/2014   Peripheral neuropathy ([place]) 12/15/2013   IBS (irritable bowel syndrome) 03/09/2012   GERD 10/26/2007   Anxiety state 11/23/2006   Depression, major, single episode, mild ([place]) 11/23/2006   Past Surgical History:  Procedure Laterality Date   BELPHAROPTOSIS REPAIR Bilateral 01/2015   CATARACT EXTRACTION W/ INTRAOCULAR LENS  IMPLANT,  BILATERAL  04/2016   COLONOSCOPY WITH ESOPHAGOGASTRODUODENOSCOPY (EGD)  last one 03-02-2013   CYSTOSCOPY/RETROGRADE/URETEROSCOPY/STONE EXTRACTION WITH BASKET  2010   CYSTOSCOPY/URETEROSCOPY/HOLMIUM LASER/STENT PLACEMENT Left 03/16/2017   Procedure: CYSTOSCOPY/RETROGRADE/URETEROSCOPY/HOLMIUM LASER/STENT PLACEMENT, STONE BASKET EXTRACTION AND STENT PLACEMENT;  Surgeon: [name], MD;  Location: [facility];  Service: Urology;  Laterality: Left;   EXCISIONAL HEMORRHOIDECTOMY  2015 approx.   EXTRACORPOREAL SHOCK WAVE LITHOTRIPSY  1987; 1992   FOOT NEUROMA SURGERY Bilateral left 09/2015; right 04/ 2017   morton' neuroma   FOOT NEUROMA SURGERY Right 2004   morton's neuroma and removal heel spur   HAMMER TOE SURGERY Left 2013   5th toe   INCISION AND DRAINAGE FOOT Left 1992   "ball of foot; stepped on piece of hard wire & it went thru my shoe"   IR ANGIOGRAM PULMONARY BILATERAL SELECTIVE  12/24/2021   IR ANGIOGRAM SELECTIVE EACH ADDITIONAL VESSEL  12/24/2021   IR ANGIOGRAM SELECTIVE EACH ADDITIONAL VESSEL  12/24/2021   IR INFUSION THROMBOL ARTERIAL INITIAL (MS)  12/24/2021   IR INFUSION THROMBOL ARTERIAL INITIAL (MS)  12/24/2021   IR THROMB F/U EVAL ART/VEN FINAL DAY (MS)  12/25/2021   IR US GUIDE VASC ACCESS RIGHT  12/24/2021   KNEE ARTHROSCOPY Bilateral left x2 1997;  right 2004 & 2005   NASAL FRACTURE SURGERY  1997   PATELLA-FEMORAL ARTHROPLASTY Right 07-06-2003   dr Eddie Dibbles [place] Bilateral 1987 & 1993   RHINOPLASTY  2000   SHOULDER ARTHROSCOPY Left 2012;  2013   SHOULDER ARTHROSCOPY WITH DISTAL CLAVICLE RESECTION Right 07-31-2005   dr Percell Miller  [facility]   w/ Debridement labral and adhesions, acromioplasty, bursectomy, and CA ligament release   TRANSTHORACIC ECHOCARDIOGRAM  02/12/2016   ef 46-80%, grade 1 diastolic dysfunction/  trivial TR    Family History  Problem Relation Age of Onset   Heart disease Mother    Stroke Mother    Cirrhosis Father    Heart  disease Father    Skin cancer Father    Prostate cancer Maternal Grandfather    Parkinson's disease Paternal Grandfather    Heart disease Brother    Colon cancer Neg Hx    Stomach cancer Neg Hx     Medications- Reconciled discharge and current medications in Epic.  Current Outpatient Medications  Medication Sig Dispense Refill   fluticasone (FLONASE) 50 MCG/ACT nasal spray Place 2 sprays into both nostrils daily. USE twice a day for 1 week, then reduce to using daily. 16 g 6   hyoscyamine (LEVSIN SL) 0.125 MG SL tablet Take 2 tablets (0.25 mg total) by mouth every 6 (six) hours as needed. DISSOLVE 1 TABLET(0.125 MG) UNDER THE TONGUE EVERY 6 HOURS AS NEEDED 120 tablet 3   ibandronate (BONIVA) 150 MG tablet Take 150 mg by mouth every 30 (thirty) days.     LORazepam (ATIVAN) 1 MG tablet Take 0.5-1 tablets (0.5-1 mg total) by mouth every 8 (eight) hours as needed for anxiety. 90 tablet 0   omeprazole (  PRILOSEC) 20 MG capsule Take 1 capsule (20 mg total) by mouth daily. 90 capsule 3   [START ON 01/26/2022] rivaroxaban (XARELTO) 20 MG TABS tablet Take 1 tablet (20 mg total) by mouth daily with supper. START this only after you have completed the starter pack. 90 tablet 1   RIVAROXABAN (XARELTO) VTE STARTER PACK (15 & 20 MG) Follow package directions: Take one '15mg'$  tablet by mouth twice a day. On day 22, switch to one '20mg'$  tablet once a day. Take with food.  Once you complete this starter pack-resume your usual dosing of Xarelto that you have at home. 51 each 0   traMADol (ULTRAM) 50 MG tablet Take 50 mg by mouth 3 (three) times daily as needed.     triamcinolone cream (KENALOG) 0.1 % Apply topically 2 (two) times daily as needed.     No current facility-administered medications for this visit.    Allergies-reviewed and updated Allergies  Allergen Reactions   Effexor Xr [Venlafaxine Hcl Er] Other (See Comments)    Insomnia   Acyclovir And Related    Zocor [Simvastatin] Other (See Comments)     Muscle ache/ pain   Dexilant [Dexlansoprazole] Other (See Comments)    Muscle aches   Ivp Dye [Iodinated Contrast Media] Rash   Soma [Carisoprodol] Other (See Comments)    Sores on arm   Sulfa Antibiotics Rash    Social History   Socioeconomic History   Marital status: Divorced    Spouse name: Not on file   Number of children: 0   Years of education: Not on file   Highest education level: Not on file  Occupational History   Occupation: retired    Fish farm manager: [place] [employer]    Comment: disabled now  Tobacco Use   Smoking status: Never   Smokeless tobacco: Never  Vaping Use   Vaping Use: Never used  Substance and Sexual Activity   Alcohol use: No    Alcohol/week: 0.0 standard drinks of alcohol   Drug use: No   Sexual activity: Never  Other Topics Concern   Not on file  Social History Narrative   Goes by "[name]"   Divorced   No children   Retired from the post office   Has internet girlfriend on facebook from [place] Strain: Low Risk  (12/02/2021)   Overall Financial Resource Strain (CARDIA)    Difficulty of Paying Living Expenses: Not hard at all  Food Insecurity: No [place] (12/02/2021)   Hunger Vital Sign    Worried About Running Out of Food in the Last Year: Never true    [place] in the Last Year: Never true  Transportation Needs: No Transportation Needs (12/02/2021)   PRAPARE - Hydrologist (Medical): No    Lack of Transportation (Non-Medical): No  Physical Activity: Insufficiently Active (11/19/2020)   Exercise Vital Sign    Days of Exercise per Week: 4 days    Minutes of Exercise per Session: 30 min  Stress: No Stress Concern Present (12/02/2021)   [place]    Feeling of Stress : Only a little  Social Connections: Socially Isolated (12/02/2021)   Social Connection and Isolation Panel [NHANES]     Frequency of Communication with Friends and Family: Three times a week    Frequency of Social Gatherings with Friends and Family: Never    Attends Religious Services:  Never    Active Member of Clubs or Organizations: No    Attends Archivist Meetings: Never    Marital Status: Divorced        Objective:  Physical Exam: BP 130/86   Pulse 92   Temp 98.2 F (36.8 C) (Temporal)   Ht '6\' 1"'$  (1.854 m)   Wt 149 lb 12.8 oz (67.9 kg)   SpO2 96%   BMI 19.76 kg/m   Gen: NAD, resting comfortably CV: RRR with no murmurs appreciated Pulm: NWOB, CTAB with no crackles, wheezes, or rhonchi GI: Normal bowel sounds present. Soft, Nontender, Nondistended. MSK: No edema, cyanosis, or clubbing noted Skin: Warm, dry Neuro: Grossly normal, moves all extremities Psych: Normal affect and thought content  Time Spent: 45 minutes of total time was spent on the date of the encounter performing the following actions: chart review prior to seeing the patient including recent hospitalization,  obtaining history, performing a medically necessary exam, counseling on the treatment plan, placing orders, and documenting in our EHR.        [name]. [name], MD 12/30/2021 2:26 PM

## 2022-01-01 NOTE — Progress Notes (Signed)
Please inform patient of the following:  Great news! Labs are all NORMAL.  David Armstrong. [name], MD 01/01/2022 9:04 AM

## 2022-01-09 ENCOUNTER — Telehealth: Payer: Self-pay

## 2022-01-09 NOTE — Patient Outreach (Signed)
  Care Coordination TOC Note   Transition Care Management Unsuccessful Follow-up Telephone Call  Date of discharge and from where:  12/27/21-[facility]  Attempts:  1st Attempt  Reason for unsuccessful TCM follow-up call:  Left voice message     Hetty Blend [place] Management Telephonic Care Management Coordinator Direct Phone: [phone] Toll Free: [phone] Fax: [phone]

## 2022-01-10 ENCOUNTER — Telehealth: Payer: Self-pay

## 2022-01-10 NOTE — Patient Outreach (Signed)
  Care Coordination TOC Note Transition Care Management Follow-up Telephone Call Date of discharge and from where: 12/27/21-[facility] How have you been since you were released from the hospital? Patient voices that he is doing okay. No issues wth PE. Any questions or concerns? Yes-Patient with chronic and ongoing back issues, has had back surgeries and pain. Ultram ordered 3x/day-patient only taking once a day-fearful of overtaking med. Education provided to pt on pain mgmt. He has appt in a few days for MRI-spine. Patient sees spine specialist but ahs not seen since June. He will call and make an appt.   Items Reviewed: Did the pt receive and understand the discharge instructions provided? Yes  Medications obtained and verified? Yes  Other? No  Any new allergies since your discharge? No  Dietary orders reviewed? Yes Do you have support at home? Yes   Home Care and Equipment/Supplies: Were home health services ordered? no If so, what is the name of the agency? N/A  Has the agency set up a time to come to the patient's home? not applicable Were any new equipment or medical supplies ordered?  No What is the name of the medical supply agency? N/A Were you able to get the supplies/equipment? not applicable Do you have any questions related to the use of the equipment or supplies? No  Functional Questionnaire: (I = Independent and D = Dependent) ADLs: I  Bathing/Dressing- I  Meal Prep- I  Eating- I  Maintaining continence- I  Transferring/Ambulation- I  Managing Meds- I  Follow up appointments reviewed:  PCP Hospital f/u appt confirmed? Yes  Saw PCP on 12/30/21. [facility] f/u appt confirmed?  Not ordered . Are transportation arrangements needed? No  If their condition worsens, is the pt aware to call PCP or go to the Emergency Dept.? Yes Was the patient provided with contact information for the PCP's office or ED? Yes Was to pt encouraged to call back with  questions or concerns? Yes  SDOH assessments and interventions completed:   Yes  Care Coordination Interventions Activated:  Yes   Care Coordination Interventions:  Patient to call back specialist to make appt ASAP    Encounter Outcome:  Pt. Visit Completed    Enzo Montgomery, RN,BSN,CCM [facility] Management Telephonic Care Management Coordinator Direct Phone: [phone] Toll Free: [phone] Fax: [phone]

## 2022-01-15 ENCOUNTER — Telehealth: Payer: Self-pay | Admitting: Family Medicine

## 2022-01-15 ENCOUNTER — Ambulatory Visit
Admission: RE | Admit: 2022-01-15 | Discharge: 2022-01-15 | Disposition: A | Discharge status: Home / Self Care | Payer: Medicare Other | Source: Ambulatory Visit | Attending: Specialist | Admitting: Specialist

## 2022-01-15 DIAGNOSIS — S22080A Wedge compression fracture of T11-T12 vertebra, initial encounter for closed fracture: Secondary | ICD-10-CM

## 2022-01-15 DIAGNOSIS — M5417 Radiculopathy, lumbosacral region: Secondary | ICD-10-CM

## 2022-01-15 DIAGNOSIS — M48061 Spinal stenosis, lumbar region without neurogenic claudication: Secondary | ICD-10-CM | POA: Diagnosis not present

## 2022-01-15 DIAGNOSIS — M5136 Other intervertebral disc degeneration, lumbar region: Secondary | ICD-10-CM | POA: Diagnosis not present

## 2022-01-15 NOTE — Telephone Encounter (Signed)
Noted  

## 2022-01-15 NOTE — Telephone Encounter (Signed)
Patient states: - Had MRI of lumbar spine and thoracic spine completed on 10/04  Patient requests: -Once results from radiology come in, they be sent to [facility] at [address] [place], [place] [address].

## 2022-01-16 ENCOUNTER — Telehealth: Payer: Self-pay | Admitting: Family Medicine

## 2022-01-16 NOTE — Telephone Encounter (Signed)
Patient states: -[place] a week ago and cut his elbow  -  Believes cut is infected due to not healing, redness, and warmth upon touching  - No drainage from what he can tell   Patient has been transferred to triage.

## 2022-01-16 NOTE — Telephone Encounter (Signed)
Spoke with patient, stated injury to elbow  was done one week ago  Wound is healing, no discharge no warm to the touch. Wound is itchy  Patient requesting office visit with Dr [name] to discuss back pain and chest  Stated had a MRI done and back and chest pain was elevated due to position for procedure  Please schedule OV with PCP

## 2022-01-16 NOTE — Telephone Encounter (Signed)
Patient Name: David Armstrong HMCN Gender: Male DOB: Feb 21, 1951 Age: 71 Y 9 M 28 D Return Phone Number: [phone] (Primary) Address: City/ State/ Zip: [place] [address] Client [facility] [place] [facility] Provider [name]- MD Contact Type Call Who Is Calling Patient / Member / Family / Caregiver Call Type Triage / Clinical Relationship To Patient Self Return Phone Number [phone] (Primary) Chief Complaint Cuts and Lacerations Reason for Call Symptomatic / Request for [place] states that he fell and cut his elbow and he thinks that the cut is infected. Translation No Nurse Assessment Nurse: Toribio Harbour, RN, Joelene Millin Date/Time (Eastern Time): 01/16/2022 1:38:37 PM Confirm and document reason for call. If symptomatic, describe symptoms. ---Caller states that he fell and cut his left elbow and he thinks that the cut is infected. He injured it 2 weeks ago. Does the patient have any new or worsening symptoms? ---Yes Will a triage be completed? ---Yes Related visit to physician within the last 2 weeks? ---No Does the PT have any chronic conditions? (i.e. diabetes, asthma, this includes High risk factors for pregnancy, etc.) ---Yes List chronic conditions. ---PE, DVT Is this a behavioral health or substance abuse call? ---No Guidelines Guideline Title Affirmed Question Affirmed Notes Nurse Date/Time (Eastern Time) Wound Infection Suspected Black (necrotic), dark purple, or blisters develop in area of wound Daves, RN, Joelene Millin 01/16/2022 1:39:58 PM Chest Pain [1] Chest pain lasts > 5 minutes AND [2] age > 18 Daves, RN, Joelene Millin 01/16/2022 1:42:45 PM PLEASE NOTE: All timestamps contained within this report are represented as [place] Standard Time. CONFIDENTIALTY NOTICE: This fax transmission is intended only for the addressee. It contains  information that is legally privileged, confidential or otherwise protected from use or disclosure. If you are not the intended recipient, you are strictly prohibited from reviewing, disclosing, copying using or disseminating any of this information or taking any action in reliance on or regarding this information. If you have received this fax in error, please notify us immediately by telephone so that we can arrange for its return to [place]. Phone: [phone], Toll-Free: [phone], Fax: [phone] Page: 2 of 2 Call Id: [id number] [place]. Time Eilene Ghazi Time) Disposition Final User 01/16/2022 1:42:31 PM Go to ED Now (or PCP triage) Toribio Harbour, RN, Joelene Millin 01/16/2022 1:44:13 PM Call EMS 911 Now Yes Toribio Harbour, RN, Joelene Millin 01/16/2022 1:46:02 PM 911 Outcome Documentation Toribio Harbour, RN, Joelene Millin Reason: Refused to call 911. Insists on driving self to ED. Final Disposition 01/16/2022 1:44:13 PM Call EMS 911 Now Yes Toribio Harbour, RN, Renea Ee Disagree/Comply Disagree Caller Understands Yes PreDisposition Call Doctor Care Advice Given Per Guideline GO TO ED NOW (OR PCP TRIAGE): * IF NO PCP (PRIMARY CARE PROVIDER) SECOND-LEVEL TRIAGE: You need to be seen within the next hour. Go to the [facility] at _____________ [place] as soon as you can. CARE ADVICE per Wound Infection Suspected (Adult) guideline. CALL EMS 911 NOW: * Immediate medical attention is needed. You need to hang up and call 911 (or an ambulance). * Triager Discretion: I'll call you back in a few minutes to be sure you were able to reach them. CARE ADVICE given per Chest Pain (Adult) guideline. Referrals [facility] - ED

## 2022-01-17 ENCOUNTER — Encounter: Payer: Self-pay | Admitting: Family Medicine

## 2022-01-17 ENCOUNTER — Ambulatory Visit (INDEPENDENT_AMBULATORY_CARE_PROVIDER_SITE_OTHER): Payer: Medicare Other | Admitting: Family Medicine

## 2022-01-17 VITALS — BP 123/86 | HR 94 | Temp 98.0°F | Ht 73.0 in | Wt 150.0 lb

## 2022-01-17 DIAGNOSIS — M810 Age-related osteoporosis without current pathological fracture: Secondary | ICD-10-CM | POA: Diagnosis not present

## 2022-01-17 DIAGNOSIS — F320 Major depressive disorder, single episode, mild: Secondary | ICD-10-CM

## 2022-01-17 DIAGNOSIS — S22000A Wedge compression fracture of unspecified thoracic vertebra, initial encounter for closed fracture: Secondary | ICD-10-CM | POA: Diagnosis not present

## 2022-01-17 MED ORDER — DESVENLAFAXINE SUCCINATE ER 50 MG PO TB24: 50.0000 mg | ORAL_TABLET | Freq: Every day | ORAL | 3 refills | Status: DC

## 2022-01-17 NOTE — Assessment & Plan Note (Signed)
Patient does admit that he has been feeling more down and depressed recently.  He does follow with a psychiatrist however is only being prescribed Ativan.  No active SI or HI.  May benefit from starting antidepressant.  He has not had good success with Effexor in the past.  He also had bad reaction to Cymbalta.  We can try Pristiq to see if this may help some with his chronic pain issues as above.  He will follow-up in a couple weeks via [facility].  We discussed potential side effects of medication.

## 2022-01-17 NOTE — Assessment & Plan Note (Signed)
On Boniva 1 6 mg every 30 days per spine specialist.

## 2022-01-17 NOTE — Patient Instructions (Addendum)
It was very nice to see you today!  I think you have a new compression fracture.  Please try the pristiq to see if this helps with your mood and your pain.  Let me know how you are doing in a few weeks.   Take care, Dr [name]  PLEASE NOTE:  If you had any lab tests please let us know if you have not heard back within a few days. You may see your results on mychart before we have a chance to review them but we will give you a call once they are reviewed by [place]. If we ordered any referrals today, please let us know if you have not heard from their office within the next week.   Please try these tips to maintain a healthy lifestyle:  Eat at least 3 REAL meals and 1-2 snacks per day.  Aim for no more than 5 hours between eating.  If you eat breakfast, please do so within one hour of getting up.   Each meal should contain half fruits/vegetables, one quarter protein, and one quarter carbs (no bigger than a computer mouse)  Cut down on sweet beverages. This includes juice, soda, and sweet tea.   Drink at least 1 glass of water with each meal and aim for at least 8 glasses per day  Exercise at least 150 minutes every week.

## 2022-01-17 NOTE — Progress Notes (Signed)
   David Armstrong is a 71 y.o. male who presents today for an office visit.  Assessment/Plan:  New/Acute Problems: Skin Tear Seems to be healing normally without any signs of infection. Will continue with watchful waiting  Back Pain His MRI results have not been reviewed by radiology yet. We individually reviewed his MRI today and it looks like has a new compression fracture in his mid thoracic spine. Does not have any red flag symptoms today. He has follow up with neurology soon. He has tramadol to use as needed for pain.     Pruritis Secondary to xerosis cutis. Recommended lotion and hydrocortisone cream.   Chronic Problems Addressed Today: Osteoporosis On Boniva 1 6 mg every 30 days per spine specialist.  Compression fracture of body of thoracic vertebra (HCC) On my review of MRI looks like he has a new thoracic compression fracture.  We are awaiting radiology read.  Continue management as above.  He will follow-up with neurosurgery soon.  Depression, major, single episode, mild ([place]) Patient does admit that he has been feeling more down and depressed recently.  He does follow with a psychiatrist however is only being prescribed Ativan.  No active SI or HI.  May benefit from starting antidepressant.  He has not had good success with Effexor in the past.  He also had bad reaction to Cymbalta.  We can try Pristiq to see if this may help some with his chronic pain issues as above.  He will follow-up in a couple weeks via [facility].  We discussed potential side effects of medication.     Subjective:  HPI:  See A/p for status of chronic conditions. He injuried his left elbow a week ago. Feel backwards and landed on his left elbow. He fell onto carpet at home after losing his balance.  Had some bleeding there but this has been controlled.  He has had worsening back pain since then.  Located between shoulder blades in his back. He is worried about recurrent compression fracture. He has not  had any worsening numbness or tingling. No bowel or bladder incontinence. Hard to find a comfortable position. Tramadol has not given much benefit.        Objective:  Physical Exam: BP 123/86   Pulse 94   Temp 98 F (36.7 C) (Temporal)   Ht '6\' 1"'$  (1.854 m)   Wt 150 lb (68 kg)   SpO2 97%   BMI 19.79 kg/m   Gen: No acute distress, resting comfortably MSK: Tender along mid thoracic back. Neurovascularly intact distally. CN2-12 grossly intact.  Skin: Skin tear on left elbow with healthy granulation tissue present. Psych: Normal affect and thought content  Time Spent: 45 minutes of total time was spent on the date of the encounter performing the following actions: chart review prior to seeing the patient, obtaining history, performing a medically necessary exam, reviewing his recent MRI, counseling on the treatment plan, placing orders, and documenting in our EHR.        [name]. [name], MD 01/17/2022 12:49 PM

## 2022-01-17 NOTE — Assessment & Plan Note (Signed)
On my review of MRI looks like he has a new thoracic compression fracture.  We are awaiting radiology read.  Continue management as above.  He will follow-up with neurosurgery soon.

## 2022-01-20 DIAGNOSIS — S22050D Wedge compression fracture of T5-T6 vertebra, subsequent encounter for fracture with routine healing: Secondary | ICD-10-CM | POA: Diagnosis not present

## 2022-01-20 DIAGNOSIS — S22050A Wedge compression fracture of T5-T6 vertebra, initial encounter for closed fracture: Secondary | ICD-10-CM | POA: Diagnosis not present

## 2022-01-20 DIAGNOSIS — M549 Dorsalgia, unspecified: Secondary | ICD-10-CM | POA: Diagnosis not present

## 2022-01-22 ENCOUNTER — Telehealth: Payer: Self-pay | Admitting: Family Medicine

## 2022-01-22 NOTE — Telephone Encounter (Signed)
Caller States: -pt is trying to be scheduled -Specialty needs "blood thinner Clearance" to complete scheduling.  -She will fax over the form today (01/22/22). -Hoping to get it back by 01/23/22  Caller acknowledges that is a fast turn around and it cannot be guaranteed.  Form received, printed and placed in provider's box.

## 2022-01-23 DIAGNOSIS — Z01812 Encounter for preprocedural laboratory examination: Secondary | ICD-10-CM | POA: Diagnosis not present

## 2022-01-23 NOTE — Telephone Encounter (Signed)
Form placed at PCP office to be sign

## 2022-01-23 NOTE — Telephone Encounter (Signed)
Lauren called-confirmed receipt of blood thinner clearance form.  States she is also faxing "medical clearance form" to be completed and requests completed form be faxed to [phone]

## 2022-01-23 NOTE — Telephone Encounter (Signed)
Spoke with Ander Purpura, she confirmed received both forms

## 2022-01-23 NOTE — Telephone Encounter (Signed)
Form faxed to [phone]

## 2022-01-28 DIAGNOSIS — M8008XS Age-related osteoporosis with current pathological fracture, vertebra(e), sequela: Secondary | ICD-10-CM | POA: Diagnosis not present

## 2022-02-07 ENCOUNTER — Telehealth: Payer: Self-pay | Admitting: *Deleted

## 2022-02-07 ENCOUNTER — Encounter: Payer: Self-pay | Admitting: *Deleted

## 2022-02-07 NOTE — Patient Instructions (Signed)
Visit Information  Thank you for taking time to visit with me today. Please don't hesitate to contact me if I can be of assistance to you.   Following are the goals we discussed today:   Goals Addressed               This Visit's Progress     Compression stockings/Would like to just talk with someone (pt-stated)        Care Coordination Interventions: Reviewed medications with patient and discussed adherence with all medications with no needed refills Reviewed scheduled/upcoming provider appointments including pending appointments Screening for signs and symptoms of depression related to chronic disease state  Assessed social determinant of health barriers Will provide resource for senior conversations to AVS as discussed ( [facility] for [facility] [phone]). Provided additional resources to obtain measured compression stocking Occupational hygienist Therapy [place], [place], [place] [phone]) No other needs presented at this time.        Contact the SeniorLine From [place]: [phone] From [place]: [phone] Please call the care guide team at [phone] if you need to cancel or reschedule your appointment.   If you are experiencing a Mental Health or [place] or need someone to talk to, please call the Suicide and Crisis Lifeline: 988 call the [place] National Suicide Prevention Lifeline: 519-179-9965 or TTY: 7630772935 TTY 443-610-3195) to talk to a trained counselor call 1-800-273-TALK (toll free, 24 hour hotline) go to [facility] [address], [place] [phone])   Patient verbalizes understanding of instructions and care plan provided today and agrees to view in [place]. Active MyChart status and patient understanding of how to access instructions and care plan via MyChart confirmed with patient.     No further follow up required: No further needs at this time     Raina Mina, RN Care Management Coordinator [place] Office [phone]

## 2022-02-07 NOTE — Patient Outreach (Signed)
  Care Coordination   Initial Visit Note   02/07/2022 Name: David Armstrong MRN: [id number] DOB: 1950-09-12  David Armstrong is a 71 y.o. year old male who sees Vivi Barrack, MD for primary care. I spoke with  [name] by phone today.  What matters to the patients health and wellness today?  Resource for compassion stockings and just to have a conversations due to limited family members    Goals Addressed               This Visit's Progress     Compression stockings/Would like to just talk with someone (pt-stated)        Care Coordination Interventions: Reviewed medications with patient and discussed adherence with all medications with no needed refills Reviewed scheduled/upcoming provider appointments including pending appointments Screening for signs and symptoms of depression related to chronic disease state  Assessed social determinant of health barriers Will provide resource for senior conversations to AVS as discussed ( [facility] for [facility] [phone]). Provided additional resources to obtain measured compression stocking Occupational hygienist Therapy [place], [place], [place] [phone]) No other needs presented at this time.         SDOH assessments and interventions completed:  Yes  SDOH Interventions Today    Flowsheet Row Most Recent Value  SDOH Interventions   Food Insecurity Interventions Intervention Not Indicated  Housing Interventions Intervention Not Indicated  Transportation Interventions Intervention Not Indicated  Utilities Interventions Intervention Not Indicated  Depression Interventions/Treatment  Counseling, Currently on Treatment, Medication        Care Coordination Interventions Activated:  Yes  Care Coordination Interventions:  Yes, provided   Follow up plan: No further intervention required.   Encounter Outcome:  Pt. Visit Completed   Raina Mina, RN Care Management Coordinator [place] Office [phone]

## 2022-02-10 DIAGNOSIS — K1370 Unspecified lesions of oral mucosa: Secondary | ICD-10-CM | POA: Diagnosis not present

## 2022-02-11 ENCOUNTER — Encounter: Payer: Self-pay | Admitting: Family Medicine

## 2022-02-11 ENCOUNTER — Ambulatory Visit (INDEPENDENT_AMBULATORY_CARE_PROVIDER_SITE_OTHER): Payer: Medicare Other | Admitting: Family Medicine

## 2022-02-11 VITALS — BP 127/81 | HR 85 | Temp 97.1°F | Ht 73.0 in | Wt 150.2 lb

## 2022-02-11 DIAGNOSIS — L219 Seborrheic dermatitis, unspecified: Secondary | ICD-10-CM | POA: Diagnosis not present

## 2022-02-11 DIAGNOSIS — S22000A Wedge compression fracture of unspecified thoracic vertebra, initial encounter for closed fracture: Secondary | ICD-10-CM | POA: Diagnosis not present

## 2022-02-11 DIAGNOSIS — I2602 Saddle embolus of pulmonary artery with acute cor pulmonale: Secondary | ICD-10-CM | POA: Diagnosis not present

## 2022-02-11 DIAGNOSIS — M25521 Pain in right elbow: Secondary | ICD-10-CM

## 2022-02-11 MED ORDER — DESONIDE 0.05 % EX CREA: TOPICAL_CREAM | Freq: Two times a day (BID) | CUTANEOUS | 0 refills | Status: DC

## 2022-02-11 NOTE — Assessment & Plan Note (Signed)
Anticoagualted on xarelto. 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

## 2022-02-11 NOTE — Progress Notes (Signed)
   David Armstrong is a 71 y.o. male who presents today for an office visit.  Assessment/Plan:  New/Acute Problems: Ecchymosis / Elbow Pain Likely due to contusion.  We discussed typical course and color change with ecchymoses and contusion.  He is anticoagulated on xarelto which does increase his bruising risk. Pain is mostly manageable. Recommended alternating heat and ice. He has tramadol to use as needed for pain from his compression fracture. Given degree of pain will check xray. Discussed reasons to care and seek emergent care.   Chronic Problems Addressed Today: Pulmonary emboli ([place]) Anticoagualted on xarelto. 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   Compression fracture of body of thoracic vertebra (HCC) Pain is still not controlled.  Has tramadol to use as needed.  Recommended to follow-up with his neurosurgeon soon to discuss neck steps in management.  Seborrheic dermatitis Desonide refilled.     Subjective:  HPI:  See A/p for status of chronic conditions.    We saw him about 3 weeks ago.  At that time he was dealing with worsening back pain.  Did have an MRI that showed new compression fraction of his mid thoracic spine.  Followed up with neurosurgery and had kyphoplasty done recently.  Pain is persisted.  He has noticed discoloration on his right elbow over the last few days. No obvious injuries or precipitating events. Seems to be changing in color to more yellow the last several days.        Objective:  Physical Exam: BP 127/81   Pulse 85   Temp (!) 97.1 F (36.2 C) (Temporal)   Ht '6\' 1"'$  (1.854 m)   Wt 150 lb 3.2 oz (68.1 kg)   SpO2 99%   BMI 19.82 kg/m   Gen: No acute distress, resting comfortably CV: Regular rate and rhythm with no murmurs appreciated Pulm: Normal work of breathing, clear to auscultation bilaterally with no crackles, wheezes, or rhonchi MSK - Right elbow: Large ecchymosis 20cm in diameter on right olecranon  bursitis. olecranon tender to palpation. Minimal swelling.  Neuro: Grossly normal, moves all extremities Psych: Normal affect and thought content  Time Spent: 40 minutes of total time was spent on the date of the encounter performing the following actions: chart review prior to seeing the patient including recent MRIs, obtaining history, performing a medically necessary exam, counseling on the treatment plan, placing orders, and documenting in our EHR.        [name]. [name], MD 02/11/2022 2:13 PM

## 2022-02-11 NOTE — Patient Instructions (Addendum)
It was very nice to see you today!  You have a bruise on your elbow. We will check an xray to make sure nothing else is going on.   I will refill your desonide.  Please follow-up with your neurosurgeon soon.  Take care, Dr [name]  PLEASE NOTE:  If you had any lab tests please let us know if you have not heard back within a few days. You may see your results on mychart before we have a chance to review them but we will give you a call once they are reviewed by [place]. If we ordered any referrals today, please let us know if you have not heard from their office within the next week.   Please try these tips to maintain a healthy lifestyle:  Eat at least 3 REAL meals and 1-2 snacks per day.  Aim for no more than 5 hours between eating.  If you eat breakfast, please do so within one hour of getting up.   Each meal should contain half fruits/vegetables, one quarter protein, and one quarter carbs (no bigger than a computer mouse)  Cut down on sweet beverages. This includes juice, soda, and sweet tea.   Drink at least 1 glass of water with each meal and aim for at least 8 glasses per day  Exercise at least 150 minutes every week.

## 2022-02-11 NOTE — Assessment & Plan Note (Signed)
Desonide refilled.

## 2022-02-11 NOTE — Assessment & Plan Note (Signed)
Pain is still not controlled.  Has tramadol to use as needed.  Recommended to follow-up with his neurosurgeon soon to discuss neck steps in management.

## 2022-02-12 ENCOUNTER — Ambulatory Visit (INDEPENDENT_AMBULATORY_CARE_PROVIDER_SITE_OTHER)
Admission: RE | Admit: 2022-02-12 | Discharge: 2022-02-12 | Disposition: A | Discharge status: Home / Self Care | Payer: Medicare Other | Source: Ambulatory Visit | Attending: Family Medicine | Admitting: Family Medicine

## 2022-02-12 DIAGNOSIS — S22080D Wedge compression fracture of T11-T12 vertebra, subsequent encounter for fracture with routine healing: Secondary | ICD-10-CM | POA: Diagnosis not present

## 2022-02-12 DIAGNOSIS — M8008XA Age-related osteoporosis with current pathological fracture, vertebra(e), initial encounter for fracture: Secondary | ICD-10-CM | POA: Diagnosis not present

## 2022-02-12 DIAGNOSIS — M25521 Pain in right elbow: Secondary | ICD-10-CM | POA: Diagnosis not present

## 2022-02-12 DIAGNOSIS — S22050D Wedge compression fracture of T5-T6 vertebra, subsequent encounter for fracture with routine healing: Secondary | ICD-10-CM | POA: Diagnosis not present

## 2022-02-15 ENCOUNTER — Emergency Department (HOSPITAL_COMMUNITY): Payer: Medicare Other

## 2022-02-15 ENCOUNTER — Encounter (HOSPITAL_BASED_OUTPATIENT_CLINIC_OR_DEPARTMENT_OTHER): Payer: Self-pay | Admitting: Emergency Medicine

## 2022-02-15 ENCOUNTER — Emergency Department (HOSPITAL_BASED_OUTPATIENT_CLINIC_OR_DEPARTMENT_OTHER): Payer: Medicare Other

## 2022-02-15 ENCOUNTER — Other Ambulatory Visit: Payer: Self-pay

## 2022-02-15 ENCOUNTER — Emergency Department (HOSPITAL_BASED_OUTPATIENT_CLINIC_OR_DEPARTMENT_OTHER)
Admission: EM | Admit: 2022-02-15 | Discharge: 2022-02-15 | Disposition: A | Discharge status: Home / Self Care | Payer: Medicare Other | Attending: Emergency Medicine | Admitting: Emergency Medicine

## 2022-02-15 DIAGNOSIS — Z7901 Long term (current) use of anticoagulants: Secondary | ICD-10-CM | POA: Insufficient documentation

## 2022-02-15 DIAGNOSIS — Z79899 Other long term (current) drug therapy: Secondary | ICD-10-CM | POA: Diagnosis not present

## 2022-02-15 DIAGNOSIS — R200 Anesthesia of skin: Secondary | ICD-10-CM | POA: Diagnosis not present

## 2022-02-15 DIAGNOSIS — Z9889 Other specified postprocedural states: Secondary | ICD-10-CM | POA: Insufficient documentation

## 2022-02-15 DIAGNOSIS — Z86711 Personal history of pulmonary embolism: Secondary | ICD-10-CM | POA: Diagnosis not present

## 2022-02-15 DIAGNOSIS — R42 Dizziness and giddiness: Secondary | ICD-10-CM | POA: Diagnosis not present

## 2022-02-15 DIAGNOSIS — R202 Paresthesia of skin: Secondary | ICD-10-CM | POA: Insufficient documentation

## 2022-02-15 DIAGNOSIS — Z86718 Personal history of other venous thrombosis and embolism: Secondary | ICD-10-CM | POA: Insufficient documentation

## 2022-02-15 DIAGNOSIS — R55 Syncope and collapse: Secondary | ICD-10-CM | POA: Insufficient documentation

## 2022-02-15 DIAGNOSIS — R918 Other nonspecific abnormal finding of lung field: Secondary | ICD-10-CM | POA: Diagnosis not present

## 2022-02-15 DIAGNOSIS — G319 Degenerative disease of nervous system, unspecified: Secondary | ICD-10-CM | POA: Diagnosis not present

## 2022-02-15 DIAGNOSIS — Z91041 Radiographic dye allergy status: Secondary | ICD-10-CM | POA: Insufficient documentation

## 2022-02-15 DIAGNOSIS — S22080A Wedge compression fracture of T11-T12 vertebra, initial encounter for closed fracture: Secondary | ICD-10-CM | POA: Diagnosis not present

## 2022-02-15 DIAGNOSIS — G459 Transient cerebral ischemic attack, unspecified: Secondary | ICD-10-CM | POA: Diagnosis not present

## 2022-02-15 DIAGNOSIS — R29818 Other symptoms and signs involving the nervous system: Secondary | ICD-10-CM | POA: Diagnosis not present

## 2022-02-15 LAB — BASIC METABOLIC PANEL
Anion gap: 9 (ref 5–15)
BUN: 11 mg/dL (ref 8–23)
CO2: 25 mmol/L (ref 22–32)
Calcium: 9.1 mg/dL (ref 8.9–10.3)
Chloride: 103 mmol/L (ref 98–111)
Creatinine, Ser: 0.8 mg/dL (ref 0.61–1.24)
GFR, Estimated: >60 mL/min (ref >60)
Glucose, Bld: 90 mg/dL (ref 70–99)
Potassium: 3.6 mmol/L (ref 3.5–5.1)
Sodium: 137 mmol/L (ref 135–145)

## 2022-02-15 LAB — DIFFERENTIAL
Abs Immature Granulocytes: 0.02 10*3/uL (ref 0.00–0.07)
Basophils Absolute: 0.1 10*3/uL (ref 0.0–0.1)
Basophils Relative: 1 %
Eosinophils Absolute: 0.1 10*3/uL (ref 0.0–0.5)
Eosinophils Relative: 2 %
Immature Granulocytes: 0 %
Lymphocytes Relative: 25 %
Lymphs Abs: 1.6 10*3/uL (ref 0.7–4.0)
Monocytes Absolute: 0.7 10*3/uL (ref 0.1–1.0)
Monocytes Relative: 10 %
Neutro Abs: 4 10*3/uL (ref 1.7–7.7)
Neutrophils Relative %: 62 %

## 2022-02-15 LAB — CBC
HCT: 45.6 % (ref 39.0–52.0)
Hemoglobin: 15.1 g/dL (ref 13.0–17.0)
MCH: 30.8 pg (ref 26.0–34.0)
MCHC: 33.1 g/dL (ref 30.0–36.0)
MCV: 92.9 fL (ref 80.0–100.0)
Platelets: 247 10*3/uL (ref 150–400)
RBC: 4.91 MIL/uL (ref 4.22–5.81)
RDW: 14.2 % (ref 11.5–15.5)
WBC: 6.5 10*3/uL (ref 4.0–10.5)
nRBC: 0 % (ref 0.0–0.2)

## 2022-02-15 LAB — PROTIME-INR
INR: 2.4 — ABNORMAL HIGH (ref 0.8–1.2)
Prothrombin Time: 25.8 seconds — ABNORMAL HIGH (ref 11.4–15.2)

## 2022-02-15 LAB — TROPONIN I (HIGH SENSITIVITY): Troponin I (High Sensitivity): 5 ng/L (ref <18)

## 2022-02-15 LAB — APTT: aPTT: 43 seconds — ABNORMAL HIGH (ref 24–36)

## 2022-02-15 LAB — ETHANOL: Alcohol, Ethyl (B): <10 mg/dL (ref <10)

## 2022-02-15 LAB — CBG MONITORING, ED
Glucose-Capillary: 68 mg/dL — ABNORMAL LOW (ref 70–99)
Glucose-Capillary: 78 mg/dL (ref 70–99)

## 2022-02-15 MED ORDER — DIPHENHYDRAMINE HCL 25 MG PO CAPS
50.0000 mg | ORAL_CAPSULE | Freq: Once | ORAL | Status: AC
  Administered 2022-02-15: 25 mg via ORAL
  Filled 2022-02-15: qty 2

## 2022-02-15 MED ORDER — IOHEXOL 350 MG/ML SOLN
100.0000 mL | Freq: Once | INTRAVENOUS | Status: AC | PRN
  Administered 2022-02-15: 100 mL via INTRAVENOUS

## 2022-02-15 MED ORDER — DIPHENHYDRAMINE HCL 50 MG/ML IJ SOLN
50.0000 mg | Freq: Once | INTRAMUSCULAR | Status: AC
  Filled 2022-02-15: qty 1

## 2022-02-15 MED ORDER — METHYLPREDNISOLONE SODIUM SUCC 40 MG IJ SOLR
40.0000 mg | Freq: Once | INTRAMUSCULAR | Status: AC
  Administered 2022-02-15: 40 mg via INTRAVENOUS
  Filled 2022-02-15: qty 1

## 2022-02-15 NOTE — ED Notes (Signed)
MD Rancour aware of patient and symptoms.

## 2022-02-15 NOTE — Progress Notes (Addendum)
Code Stroke cart activated '@0957'$ . Pt evaluated by EDP prior to activation.  LKW 0730. NCCT ordered. Transported to CT '@1004'$ .  Dr. Leonie Man paged '@1006'$  and on camera '@1007'$ . Pt transported back to room. Telestroke Therapist, sports

## 2022-02-15 NOTE — ED Triage Notes (Signed)
Pt transferred from [place] for MRI. Pt stable.

## 2022-02-15 NOTE — ED Notes (Signed)
Pt provided discharge instructions. Pt has no one to transport him to [facility]. Taxi voucher provider to get to his car at [employer]

## 2022-02-15 NOTE — ED Provider Notes (Signed)
Clinical Course as of 02/15/22 1841  Sat Feb 15, 2022  1453 Stable 70YOF with a ccx of right sided numbness and weakness numbness. Follows with neurology. Getting CTA PE for medication noncompliance Possible OP care if grossly negative. [CC]  1833 MR BRAIN WO CONTRAST [CC]    Clinical Course User Index [CC] Tretha Sciara, MD   Care of patient received from prior provider at 1500.  Patient transferred from one of our freestanding ED's for evaluation and for left-sided facial numbness and tingling as well as left arm symptoms.  Fortunately all of his symptoms have now resolved.  He denies fevers or chills, nausea vomiting, syncope shortness of breath and he is otherwise ambulatory tolerating p.o. intake in no acute distress at this time. Objective evaluation has all resulted as recommended by neurology with no focal pathology appreciated.  Given resolution of symptoms, no acute indication for intervention.    Disposition:  I have considered need for hospitalization, however, considering all of the above, I believe this patient is stable for discharge at this time.  Patient/family educated about specific return precautions for given chief complaint and symptoms.  Patient/family educated about follow-up with PCP .     Patient/family expressed understanding of return precautions and need for follow-up. Patient spoken to regarding all imaging and laboratory results and appropriate follow up for these results. All education provided in verbal form with additional information in written form. Time was allowed for answering of patient questions. Patient discharged.    Emergency Department Medication Summary:   Medications  methylPREDNISolone sodium succinate (SOLU-MEDROL) 40 mg/mL injection 40 mg (40 mg Intravenous Given 02/15/22 1057)  diphenhydrAMINE (BENADRYL) capsule 50 mg (25 mg Oral Given 02/15/22 1623)    Or  diphenhydrAMINE (BENADRYL) injection 50 mg ( Intravenous See Alternative 02/15/22  1623)  iohexol (OMNIPAQUE) 350 MG/ML injection 100 mL (100 mLs Intravenous Contrast Given 02/15/22 1801)          Tretha Sciara, MD 02/15/22 1843

## 2022-02-15 NOTE — ED Notes (Signed)
Patient transported to MRI 

## 2022-02-15 NOTE — ED Provider Notes (Signed)
[facility] EMERGENCY DEPT Provider Note   [name]: [id number] Arrival date & time: 02/15/22  7353     History  Chief Complaint  Patient presents with   Near Syncope    David Armstrong is a 71 y.o. male.  Patient with a history of anxiety, depression, hiatal hernia, recent kyphoplasty of T6 vertebrae about 2 weeks ago, DVT and PE on Xarelto, IBS presenting with "wooziness" that onset today about 8 AM.  States he woke up feeling well but while he was at a caf he developed feeling lightheaded and woozy and felt like he is going to pass out.  Upon returning home he noticed some numbness to his left face, neck and arm with some left arm weakness as well.  Symptoms have been ongoing since approximately 8:00.  Feels the numbness is starting to improve but still present involving his left face left neck and left arm.  Denies feeling any weakness in his leg or numbness.  Arm may be slightly weaker than usual.  He states he been having ongoing back pain since his kyphoplasty surgery which is unchanged today.  No chest pain.  No cough or fever.  Takes Xarelto.  Did not fall or hit his head  The history is provided by the patient.  Near Syncope Pertinent negatives include no chest pain, no abdominal pain and no shortness of breath.       Home Medications Prior to Admission medications   Medication Sig Start Date End Date Taking? Authorizing Provider  desonide (DESOWEN) 0.05 % cream Apply topically 2 (two) times daily. 02/11/22   Vivi Barrack, MD  desvenlafaxine (PRISTIQ) 50 MG 24 hr tablet Take 1 tablet (50 mg total) by mouth daily. 01/17/22   Vivi Barrack, MD  fluticasone [facility]) 50 MCG/ACT nasal spray Place 2 sprays into both nostrils daily. USE twice a day for 1 week, then reduce to using daily. 12/21/21   Vivi Barrack, MD  hyoscyamine (LEVSIN SL) 0.125 MG SL tablet Take 2 tablets (0.25 mg total) by mouth every 6 (six) hours as needed. DISSOLVE 1 TABLET(0.125 MG) UNDER THE  TONGUE EVERY 6 HOURS AS NEEDED 12/20/21   Vivi Barrack, MD  ibandronate (BONIVA) 150 MG tablet Take 150 mg by mouth every 30 (thirty) days. 12/05/21   [provider]  LORazepam (ATIVAN) 1 MG tablet Take 0.5-1 tablets (0.5-1 mg total) by mouth every 8 (eight) hours as needed for anxiety. 05/26/17   Vivi Barrack, MD  omeprazole (PRILOSEC) 20 MG capsule Take 1 capsule (20 mg total) by mouth daily. 12/20/21   Vivi Barrack, MD  rivaroxaban (XARELTO) 20 MG TABS tablet Take 1 tablet (20 mg total) by mouth daily with supper. START this only after you have completed the starter pack. 01/26/22   Ghimire, Henreitta Leber, MD  traMADol (ULTRAM) 50 MG tablet Take 50 mg by mouth 3 (three) times daily as needed. 10/23/21   [provider]  triamcinolone cream (KENALOG) 0.1 % Apply topically 2 (two) times daily as needed. 10/17/21   [provider]      Allergies    Effexor xr [venlafaxine hcl er], Acyclovir and related, Zocor [simvastatin], Dexilant [dexlansoprazole], Ivp dye [iodinated contrast media], Soma [carisoprodol], and Sulfa antibiotics    Review of Systems   Review of Systems  Constitutional:  Negative for activity change, appetite change and fever.  HENT:  Negative for congestion.   Respiratory:  Negative for cough, chest tightness and shortness of breath.  Cardiovascular:  Positive for near-syncope. Negative for chest pain.  Gastrointestinal:  Negative for abdominal pain, nausea and vomiting.  Genitourinary:  Negative for dysuria and hematuria.  Musculoskeletal:  Negative for arthralgias and myalgias.  Skin:  Negative for rash.  Neurological:  Positive for dizziness, weakness, light-headedness and numbness.    all other systems are negative except as noted in the HPI and PMH.   Physical Exam Updated Vital Signs BP (!) 131/96 (BP Location: Right Arm)   Pulse 83   Temp 98 F (36.7 C)   Resp 16   SpO2 100%  Physical Exam Vitals and nursing note reviewed.   Constitutional:      General: He is not in acute distress.    Appearance: He is well-developed. He is not ill-appearing.  HENT:     Head: Normocephalic and atraumatic.     Mouth/Throat:     Pharynx: No oropharyngeal exudate.  Eyes:     Conjunctiva/sclera: Conjunctivae normal.     Pupils: Pupils are equal, round, and reactive to light.  Neck:     Comments: No meningismus. Cardiovascular:     Rate and Rhythm: Normal rate and regular rhythm.     Heart sounds: Normal heart sounds. No murmur heard. Pulmonary:     Effort: Pulmonary effort is normal. No respiratory distress.     Breath sounds: Normal breath sounds.  Chest:     Chest wall: No tenderness.  Abdominal:     Palpations: Abdomen is soft.     Tenderness: There is no abdominal tenderness. There is no guarding or rebound.  Musculoskeletal:        General: No tenderness. Normal range of motion.     Cervical back: Normal range of motion and neck supple.  Skin:    General: Skin is warm.  Neurological:     Mental Status: He is alert and oriented to person, place, and time.     Cranial Nerves: No cranial nerve deficit.     Motor: No abnormal muscle tone.     Coordination: Coordination normal.     Comments: Subjective numbness involving left face, neck and left arm.  Mildly decreased grip strength on the left arm.  No pronator drift.  No ataxia on finger-to-nose.  Able to lift arms and legs off the bed bilaterally with good strength.  Psychiatric:        Behavior: Behavior normal.     ED Results / Procedures / Treatments   Labs (all labs ordered are listed, but only abnormal results are displayed) Labs Reviewed  PROTIME-INR - Abnormal; Notable for the following components:      Result Value   Prothrombin Time 25.8 (*)    INR 2.4 (*)    All other components within normal limits  APTT - Abnormal; Notable for the following components:   aPTT 43 (*)    All other components within normal limits  CBG MONITORING, ED -  Abnormal; Notable for the following components:   Glucose-Capillary 68 (*)    All other components within normal limits  BASIC METABOLIC PANEL  CBC  ETHANOL  DIFFERENTIAL  URINALYSIS, ROUTINE W REFLEX MICROSCOPIC  RAPID URINE DRUG SCREEN, HOSP PERFORMED  CBG MONITORING, ED  TROPONIN I (HIGH SENSITIVITY)  TROPONIN I (HIGH SENSITIVITY)    EKG EKG Interpretation  Date/Time:  Saturday February 15 2022 09:32:34 EDT Ventricular Rate:  85 PR Interval:  172 QRS Duration: 78 QT Interval:  366 QTC Calculation: 435 R Axis:   -4 Text Interpretation:  Normal sinus rhythm Normal ECG When compared with ECG of 24-Dec-2021 10:58, PREVIOUS ECG IS PRESENT No significant change was found Confirmed by Ezequiel Essex [phone]) on 02/15/2022 9:45:02 AM  Radiology CT HEAD CODE STROKE WO CONTRAST  Result Date: 02/15/2022 CLINICAL DATA:  Code stroke.  History of strokes.  Neuro deficit EXAM: CT HEAD WITHOUT CONTRAST TECHNIQUE: Contiguous axial images were obtained from the base of the skull through the vertex without intravenous contrast. RADIATION DOSE REDUCTION: This exam was performed according to the departmental dose-optimization program which includes automated exposure control, adjustment of the mA and/or kV according to patient size and/or use of iterative reconstruction technique. COMPARISON:  05/23/2017 FINDINGS: Brain: No evidence of acute infarction, hemorrhage, hydrocephalus, extra-axial collection or mass lesion/mass effect. Vascular: No hyperdense vessel or unexpected calcification. Skull: Normal. Negative for fracture or focal lesion. Sinuses/Orbits: No acute finding. Other: Prelim sent to Dr. Wyvonnia Dusky in epic chat IMPRESSION: Negative head CT. Electronically Signed   By: Jorje Guild M.D.   On: 02/15/2022 10:18    Procedures Procedures    Medications Ordered in ED Medications - No data to display  ED Course/ Medical Decision Making/ A&P                           Medical Decision  Making Amount and/or Complexity of Data Reviewed Labs: ordered. Decision-making details documented in ED Course. Radiology: ordered and independent interpretation performed. Decision-making details documented in ED Course. ECG/medicine tests: ordered and independent interpretation performed. Decision-making details documented in ED Course.  Risk Prescription drug management.  Left-sided numbness and weakness in left arm onset 8 AM.  Associated with lightheadedness and dizziness.  Not TNK candidate given his Xarelto use but will activate code stroke.  Code stroke activated on arrival.  CT head is negative for hemorrhage.  Discussed with Dr. Pascal Lux.  Results reviewed and interpreted by me.  Seen by telemetry neuro Dr. Leonie Man.  Left-sided numbness has substantially improved and almost resolved.  Still has some numbness to his left hand and questionable weakness.  Dr. Leonie Man recommending MRI brain as well as CTA head and neck.  He states if these are reassuring patient can follow-up with neurology on his Xarelto.  Labs today are reassuring.  Stable hemoglobin.  Troponin negative x1.  Stable creatinine.  Will need transfer for MRI as it is not available at this facility.  He also has a IV contrast allergy.  We will premedicate prior to transfer.  Could alternatively pursue MRA.  However would obtain CT angiogram chest given his history of massive PE with questionable compliance of Xarelto and worsening back pain after kyphoplasty.  Per neurology recommendations, patient can be discharged home on Xarelto if MRI and CTA are reassuring.   Patient agreeable to transfer. Discussed with Dr. Truett Mainland at [facility] who accepts.         Final Clinical Impression(s) / ED Diagnoses Final diagnoses:  Paresthesia    Rx / DC Orders ED Discharge Orders     None         Apolo Cutshaw, Annie Main, MD 02/15/22 1211

## 2022-02-15 NOTE — ED Triage Notes (Signed)
3 weeks ago had thoracic vertebrae surgery. Today had period of lightheaded, left arm felt numb .started around 0800. Left hand feels weird, states he feels maybe a little difference in touch on this left hand.

## 2022-02-15 NOTE — Consult Note (Signed)
[facility] Telemedicine Consult   Requesting Provider: Charolotte Capuchin Consult Participants: patient Location of the provider: [facility] Location of the patient: [facility]  This consult was provided via telemedicine with 2-way video and audio communication. The patient/family was informed that care would be provided in this way and agreed to receive care in this manner.    Chief Complaint: Code stroke left-sided numbness  HPI: David Armstrong is a 71 year old Caucasian male with past medical history of VTE-noncompliant with Xarelto (not taking for almost 4 weeks) with recent admission 12/24/2021 to 12/27/2021 for exertional dyspnea-found to have submassive PE with acute hypoxic respiratory failure-evaluated by PCCM/IR-underwent catheter guided lytic therapy.  He is on long-term Xarelto and states he was compliant and took his dose this morning.  At about 730 he felt a little bit lightheaded and subsequently noticed sudden onset of numbness involving left face arm and leg.  This lasted around 10 to 15 minutes before he decided to seek care.  He also felt for a few minutes that he may have had some weakness in his left hand.  He drove himself to [facility] ER.  Upon evaluation by me he stated his symptoms had nearly resolved he had some residual numbness of the fingertips but he is not sure if this was related to his peripheral neuropathy which she has at baseline    LKW: 7:30 AM tpa given?: No, rapidly resolving symptoms and to mild to treat IR Thrombectomy? No, clinical history and exam not suggestive review Modified Rankin Scale: 1-No significant post stroke disability and can perform usual duties with stroke symptoms Time of teleneurologist evaluation: 1007  Exam: Vitals:   02/15/22 0934 02/15/22 1000  BP: (!) 131/96 132/89  Pulse: 83 78  Resp: 16 19  Temp: 98 F (36.7 C)   SpO2: 100% 99%    General: Pleasant elderly Caucasian male not in distress  1A:  Level of Consciousness - 0 1B: Ask Month and Age - 0 1C: 'Blink Eyes' & 'Squeeze Hands' - 0 2: Test Horizontal Extraocular Movements - 0 3: Test Visual Fields - 0 4: Test Facial Palsy - 0 5A: Test Left Arm Motor Drift - 0 5B: Test Right Arm Motor Drift - 0 6A: Test Left Leg Motor Drift - 0 6B: Test Right Leg Motor Drift - 0 7: Test Limb Ataxia - 0 8: Test Sensation - 0 subjective paresthesias left fingertips 9: Test Language/Aphasia- 0 10: Test Dysarthria - 0 11: Test Extinction/Inattention - 0 NIHSS score: 0   Imaging Reviewed: CT head without contrast  Labs reviewed in epic and pertinent values follow:   CBG 78.  CBC normal CT head noncontrast no acute abnormality.  Aspects 10.  (Personal review)   Assessment: 71 year old Caucasian male with sudden onset of transient left face arm and leg paresthesias which appears to have nearly resolved.  Likely right brain subcortical TIA versus small infarct not visualized on CT. Patient is already on Xarelto long-term for recent pulmonary embolism and took his last dose this morning hence is not a candidate for thrombolysis plus his deficits are too mild and rapidly improving. No need for emergent CT angiogram as clinical's symptoms are not suggestive of LVO. Recommendations:  Check MRI scan of the brain to evaluate for small right subcortical thalamic infarct not visualized on CT.  Continue Xarelto anticoagulation for history of pulmonary embolism.  Check CT angiogram of the brain and neck.  If neurovascular imaging is negative may be discharged home after  that. If MRI confirms a stroke patient will be needed to be admitted to continue further stroke work-up and checking echocardiogram, lipid profile, hemoglobin A1c. Long discussion with patient and answered questions. Discussed with Dr. Graylon Gunning, ER, MD    This patient is receiving care for possible acute neurological changes. There was 55 minutes of care by this provider at the  time of service, including time for direct evaluation via telemedicine, review of medical records, imaging studies and discussion of findings with providers, the patient and/or family.  Antony Contras MD [facility] [phone]  If 7pm- 7am, please page neurology on call as listed in [place].

## 2022-02-15 NOTE — ED Triage Notes (Signed)
Pt is on xarleto for hx DVT and PE

## 2022-02-15 NOTE — ED Notes (Signed)
Patient called x 2 with no answer 

## 2022-02-18 ENCOUNTER — Telehealth: Payer: Self-pay

## 2022-02-18 NOTE — Patient Outreach (Signed)
  Care Coordination TOC Note Transition Care Management Follow-up Telephone Call Date of discharge and from where: 02/15/22-[facility]  Dx: Eda Keys On EMMI -ED Discharge Alert: Scheduled Follow-Up Appt How have you been since you were released from the hospital? Patient voices that Any questions or concerns? No  Items Reviewed: Did the pt receive and understand the discharge instructions provided? Yes  Medications obtained and verified? Yes  Other? Yes  Any new allergies since your discharge? No  Dietary orders reviewed? Yes Do you have support at home? Yes   Home Care and Equipment/Supplies: Were home health services ordered? not applicable If so, what is the name of the agency? N/A  Has the agency set up a time to come to the patient's home? not applicable Were any new equipment or medical supplies ordered?  No What is the name of the medical supply agency? N/A Were you able to get the supplies/equipment? no Do you have any questions related to the use of the equipment or supplies? No  Functional Questionnaire: (I = Independent and D = Dependent) ADLs: I  Bathing/Dressing- I  Meal Prep- I  Eating- I  Maintaining continence- I  Transferring/Ambulation- I  Managing Meds- I  Follow up appointments reviewed:  PCP Hospital f/u appt confirmed? No  Patient will call PCP office today to make an appt. [facility] f/u appt confirmed?  N/A . Are transportation arrangements needed? No  If their condition worsens, is the pt aware to call PCP or go to the Emergency Dept.? Yes Was the patient provided with contact information for the PCP's office or ED? Yes Was to pt encouraged to call back with questions or concerns? Yes  SDOH assessments and interventions completed:   Yes  Care Coordination Interventions Activated:  Yes   Care Coordination Interventions:  Education provided    Encounter Outcome:  Pt. Visit Completed    Enzo Montgomery,  RN,BSN,CCM [facility] Telephonic Care Management Coordinator Direct Phone: [phone] Toll Free: [phone] Fax: [phone]

## 2022-02-18 NOTE — Progress Notes (Signed)
Please inform patient of the following:  Good news! His xray is normal. Would like for him to let us know if his pain is not improving.  [name]. [name], MD 02/18/2022 7:42 AM

## 2022-02-21 ENCOUNTER — Telehealth: Payer: Self-pay | Admitting: *Deleted

## 2022-02-21 ENCOUNTER — Encounter: Payer: Self-pay | Admitting: *Deleted

## 2022-02-21 NOTE — Patient Outreach (Addendum)
  Care Coordination [facility] Note Transition Care Management Follow-up Telephone Call Date of discharge and from where: Saturday 02/15/22- [facility]- parethesias; EMMI Red Alert: "feel sad, depressed hopeless" How have you been since you were released from the hospital? "I am doing okay, I just have a lot going on- I am in pain pretty regularly and have had some family issues lately, my sister is sick and my niece just passed away, so I am a bit more upset than I normally am.  I am currently seeing a psychiatrist, not sure when my next appointment is, but I will do as you say and make an appointment soon.  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  I am taking my medicine and will also discuss this with Dr. [name] when I see him on Monday 02/24/22.  I am not thinking about hurting or harming myself.... I just seem to have a lot going on these days" Any questions or concerns? No  Items Reviewed: Did the pt receive and understand the discharge instructions provided? Yes  Medications obtained and verified? No  N/A Other? No  Any new allergies since your discharge? No  Dietary orders reviewed? No Do you have support at home? No  patient reports takes care of self independently  Home Care and Equipment/Supplies: Were home health services ordered? not applicable If so, what is the name of the agency? N/A  Has the agency set up a time to come to the patient's home? not applicable Were any new equipment or medical supplies ordered?  No What is the name of the medical supply agency? N/A Were you able to get the supplies/equipment? not applicable Do you have any questions related to the use of the equipment or supplies? No N/A  Functional Questionnaire: (I = Independent and D = Dependent) ADLs: I  Bathing/Dressing- I  Meal Prep- I  Eating- I  Maintaining continence- I  Transferring/Ambulation- I  Managing Meds- I  Follow up appointments  reviewed:  PCP Hospital f/u appt confirmed? Yes  Scheduled to see PCP, Dr. [name] on Monday 02/24/22 @ 11:00 am Specialist Hospital f/u appt confirmed? No  Scheduled to see - on - @ - encouraged patient to follow up with established psychiatry provider to ensure prompt appointment Are transportation arrangements needed? No  If their condition worsens, is the pt aware to call PCP or go to the Emergency Dept.? Yes Was the patient provided with contact information for the PCP's office or ED? Yes Was to pt encouraged to call back with questions or concerns? Yes  SDOH assessments and interventions completed:   Yes  Care Coordination Interventions Activated:  Yes   Care Coordination Interventions:  Depression screening completed; Care coordination outreach completed to update PCP of patient's reported worsening depression    Encounter Outcome:  Pt. Visit Completed    Oneta Rack, RN, BSN, CCRN Alumnus RN CM Care Coordination/ Transition of [facility] [facility] [phone]: direct office

## 2022-02-24 ENCOUNTER — Ambulatory Visit (INDEPENDENT_AMBULATORY_CARE_PROVIDER_SITE_OTHER): Payer: Medicare Other | Admitting: Family Medicine

## 2022-02-24 ENCOUNTER — Encounter: Payer: Self-pay | Admitting: Family Medicine

## 2022-02-24 VITALS — BP 134/90 | HR 87 | Temp 98.4°F | Ht 73.0 in | Wt 147.2 lb

## 2022-02-24 DIAGNOSIS — F320 Major depressive disorder, single episode, mild: Secondary | ICD-10-CM

## 2022-02-24 DIAGNOSIS — S22000A Wedge compression fracture of unspecified thoracic vertebra, initial encounter for closed fracture: Secondary | ICD-10-CM

## 2022-02-24 DIAGNOSIS — I2602 Saddle embolus of pulmonary artery with acute cor pulmonale: Secondary | ICD-10-CM

## 2022-02-24 DIAGNOSIS — F411 Generalized anxiety disorder: Secondary | ICD-10-CM | POA: Diagnosis not present

## 2022-02-24 NOTE — Assessment & Plan Note (Signed)
Symptoms are still not controlled.  Did not tolerate Pristiq.  Has been on several medications in the past without much improvement.  He does follow with psychiatry for this.  Advised him to call to schedule appoint with them soon.  No reported SI or HI.

## 2022-02-24 NOTE — Assessment & Plan Note (Signed)
Continue management per neurosurgery.

## 2022-02-24 NOTE — Assessment & Plan Note (Signed)
No recurrence on CT a few days ago.  He needs to be on lifelong anticoagulation due to recurrent nature.

## 2022-02-24 NOTE — Assessment & Plan Note (Signed)
Follows with psychiatry.  On Ativan as needed.  Did not tolerate Pristiq as above.

## 2022-02-24 NOTE — Progress Notes (Addendum)
   David Armstrong is a 71 y.o. male who presents today for an office visit.  Assessment/Plan:  New/Acute Problems: Numbness Symptoms have resolved. MRI in the ED was negative. May have had a transient radiculopathy. TIA is also possible. Will defer further work up to neurologist. Discussed reasons to return to care and seek emergent care.  Rash Continue management per dermatology.  May be having a reaction to Xarelto.  He will discuss further with dermatology.  Xarelto for now given his history of recurrence of VTE.  Chronic Problems Addressed Today: Depression, major, single episode, mild ([place]) Symptoms are still not controlled.  Did not tolerate Pristiq.  Has been on several medications in the past without much improvement.  He does follow with psychiatry for this.  Advised him to call to schedule appoint with them soon.  No reported SI or HI.  Anxiety state Follows with psychiatry.  On Ativan as needed.  Did not tolerate Pristiq as above.  Compression fracture of body of thoracic vertebra (HCC) Continue management per neurosurgery.  Pulmonary emboli (HCC) No recurrence on CT a few days ago.  He needs to be on lifelong anticoagulation due to recurrent nature.     Subjective:  HPI:  Patient here for ED follow-up.  Went to the ED 9 days ago with left-sided facial numbness and tingling.  Code stroke was called upon presentation.  He was found to have some chronic changes but no acute changes.  Symptoms resolved while in the ED. HE also had a CTA which showed no PE. Neurology was consulted. He was discharged home to continue with his xarelto. Over the last several days he has had not had any recurrence of his symptoms. He will be following up with his neurologist soon.   He has also had some rash on lower extremities and buttocks. He is seeing dermatology for this. Recently had a biopsy performed that showed he had a rare skin condition. He does not remember the name of the conditiion.         Objective:  Physical Exam: BP (!) 134/90   Pulse 87   Temp 98.4 F (36.9 C) (Temporal)   Ht '6\' 1"'$  (1.854 m)   Wt 147 lb 3.2 oz (66.8 kg)   SpO2 100%   BMI 19.42 kg/m   Gen: No acute distress, resting comfortably CV: Regular rate and rhythm with no murmurs appreciated Pulm: Normal work of breathing, clear to auscultation bilaterally with no crackles, wheezes, or rhonchi Skin: Papular erythematous rash in on bilateral lower extremities and lower trunk. Neuro: Grossly normal, moves all extremities.  No apparent weakness or numbness.  Cranial nerves II through XII intact. Psych: Normal affect and thought content  Time Spent: 45 minutes of total time was spent on the date of the encounter performing the following actions: chart review prior to seeing the patient including recent ED visit, obtaining history, performing a medically necessary exam, counseling on the treatment plan, placing orders, and documenting in our EHR.        [name]. [name], MD 02/24/2022 12:17 PM

## 2022-02-24 NOTE — Patient Instructions (Signed)
It was very nice to see you today!  I am glad that you are feeling better.  Please schedule an appointment with the neurologist and psychiatrist soon.  Take care, Dr [name]  PLEASE NOTE:  If you had any lab tests please let us know if you have not heard back within a few days. You may see your results on mychart before we have a chance to review them but we will give you a call once they are reviewed by [place]. If we ordered any referrals today, please let us know if you have not heard from their office within the next week.   Please try these tips to maintain a healthy lifestyle:  Eat at least 3 REAL meals and 1-2 snacks per day.  Aim for no more than 5 hours between eating.  If you eat breakfast, please do so within one hour of getting up.   Each meal should contain half fruits/vegetables, one quarter protein, and one quarter carbs (no bigger than a computer mouse)  Cut down on sweet beverages. This includes juice, soda, and sweet tea.   Drink at least 1 glass of water with each meal and aim for at least 8 glasses per day  Exercise at least 150 minutes every week.

## 2022-02-26 ENCOUNTER — Telehealth: Payer: Self-pay | Admitting: Family Medicine

## 2022-02-26 NOTE — Telephone Encounter (Signed)
Faxing form  Will call back if we do not receive it.   [phone] courtney xarelto cesation for biopsty

## 2022-02-26 NOTE — Telephone Encounter (Signed)
Form has been received and placed in provider's box

## 2022-03-10 DIAGNOSIS — K1329 Other disturbances of oral epithelium, including tongue: Secondary | ICD-10-CM | POA: Diagnosis not present

## 2022-03-13 DIAGNOSIS — M8008XS Age-related osteoporosis with current pathological fracture, vertebra(e), sequela: Secondary | ICD-10-CM | POA: Diagnosis not present

## 2022-03-13 DIAGNOSIS — M47817 Spondylosis without myelopathy or radiculopathy, lumbosacral region: Secondary | ICD-10-CM | POA: Diagnosis not present

## 2022-03-13 DIAGNOSIS — S22050D Wedge compression fracture of T5-T6 vertebra, subsequent encounter for fracture with routine healing: Secondary | ICD-10-CM | POA: Diagnosis not present

## 2022-03-13 DIAGNOSIS — S22080D Wedge compression fracture of T11-T12 vertebra, subsequent encounter for fracture with routine healing: Secondary | ICD-10-CM | POA: Diagnosis not present

## 2022-03-14 DIAGNOSIS — R311 Benign essential microscopic hematuria: Secondary | ICD-10-CM | POA: Diagnosis not present

## 2022-03-14 DIAGNOSIS — N200 Calculus of kidney: Secondary | ICD-10-CM | POA: Diagnosis not present

## 2022-03-31 DIAGNOSIS — L400 Psoriasis vulgaris: Secondary | ICD-10-CM | POA: Diagnosis not present

## 2022-04-11 ENCOUNTER — Other Ambulatory Visit: Payer: Self-pay

## 2022-04-11 ENCOUNTER — Emergency Department (HOSPITAL_BASED_OUTPATIENT_CLINIC_OR_DEPARTMENT_OTHER): Payer: Medicare Other

## 2022-04-11 ENCOUNTER — Encounter (HOSPITAL_BASED_OUTPATIENT_CLINIC_OR_DEPARTMENT_OTHER): Payer: Self-pay | Admitting: Emergency Medicine

## 2022-04-11 ENCOUNTER — Emergency Department (HOSPITAL_BASED_OUTPATIENT_CLINIC_OR_DEPARTMENT_OTHER)
Admission: EM | Admit: 2022-04-11 | Discharge: 2022-04-11 | Disposition: A | Discharge status: Home / Self Care | Payer: Medicare Other | Attending: Emergency Medicine | Admitting: Emergency Medicine

## 2022-04-11 DIAGNOSIS — Z20822 Contact with and (suspected) exposure to covid-19: Secondary | ICD-10-CM | POA: Insufficient documentation

## 2022-04-11 DIAGNOSIS — R202 Paresthesia of skin: Secondary | ICD-10-CM | POA: Diagnosis not present

## 2022-04-11 DIAGNOSIS — J40 Bronchitis, not specified as acute or chronic: Secondary | ICD-10-CM | POA: Diagnosis not present

## 2022-04-11 DIAGNOSIS — Z7901 Long term (current) use of anticoagulants: Secondary | ICD-10-CM | POA: Diagnosis not present

## 2022-04-11 DIAGNOSIS — J205 Acute bronchitis due to respiratory syncytial virus: Secondary | ICD-10-CM | POA: Insufficient documentation

## 2022-04-11 DIAGNOSIS — R200 Anesthesia of skin: Secondary | ICD-10-CM | POA: Diagnosis present

## 2022-04-11 DIAGNOSIS — Z79899 Other long term (current) drug therapy: Secondary | ICD-10-CM | POA: Insufficient documentation

## 2022-04-11 DIAGNOSIS — G459 Transient cerebral ischemic attack, unspecified: Secondary | ICD-10-CM | POA: Diagnosis not present

## 2022-04-11 LAB — DIFFERENTIAL
Abs Immature Granulocytes: 0.04 K/uL (ref 0.00–0.07)
Basophils Absolute: 0 K/uL (ref 0.0–0.1)
Basophils Relative: 0 %
Eosinophils Absolute: 0.3 K/uL (ref 0.0–0.5)
Eosinophils Relative: 4 %
Immature Granulocytes: 1 %
Lymphocytes Relative: 23 %
Lymphs Abs: 1.7 K/uL (ref 0.7–4.0)
Monocytes Absolute: 0.7 K/uL (ref 0.1–1.0)
Monocytes Relative: 10 %
Neutro Abs: 4.5 K/uL (ref 1.7–7.7)
Neutrophils Relative %: 62 %

## 2022-04-11 LAB — CBC
HCT: 44.5 % (ref 39.0–52.0)
Hemoglobin: 14.4 g/dL (ref 13.0–17.0)
MCH: 29.4 pg (ref 26.0–34.0)
MCHC: 32.4 g/dL (ref 30.0–36.0)
MCV: 90.8 fL (ref 80.0–100.0)
Platelets: 195 K/uL (ref 150–400)
RBC: 4.9 MIL/uL (ref 4.22–5.81)
RDW: 13.7 % (ref 11.5–15.5)
WBC: 7.2 K/uL (ref 4.0–10.5)
nRBC: 0 % (ref 0.0–0.2)

## 2022-04-11 LAB — COMPREHENSIVE METABOLIC PANEL
ALT: 12 U/L (ref 0–44)
AST: 17 U/L (ref 15–41)
Albumin: 4 g/dL (ref 3.5–5.0)
Alkaline Phosphatase: 84 U/L (ref 38–126)
Anion gap: 7 (ref 5–15)
BUN: 12 mg/dL (ref 8–23)
CO2: 29 mmol/L (ref 22–32)
Calcium: 8.8 mg/dL — ABNORMAL LOW (ref 8.9–10.3)
Chloride: 105 mmol/L (ref 98–111)
Creatinine, Ser: 0.79 mg/dL (ref 0.61–1.24)
GFR, Estimated: >60 mL/min (ref >60)
Glucose, Bld: 101 mg/dL — ABNORMAL HIGH (ref 70–99)
Potassium: 3.9 mmol/L (ref 3.5–5.1)
Sodium: 141 mmol/L (ref 135–145)
Total Bilirubin: 0.5 mg/dL (ref 0.3–1.2)
Total Protein: 6.5 g/dL (ref 6.5–8.1)

## 2022-04-11 LAB — RESP PANEL BY RT-PCR (RSV, FLU A&B, COVID)  RVPGX2
Influenza A by PCR: NEGATIVE
Influenza B by PCR: NEGATIVE
Resp Syncytial Virus by PCR: POSITIVE — AB
SARS Coronavirus 2 by RT PCR: NEGATIVE

## 2022-04-11 LAB — PROTIME-INR
INR: 1.7 — ABNORMAL HIGH (ref 0.8–1.2)
Prothrombin Time: 20 seconds — ABNORMAL HIGH (ref 11.4–15.2)

## 2022-04-11 LAB — ETHANOL: Alcohol, Ethyl (B): <10 mg/dL (ref <10)

## 2022-04-11 LAB — APTT: aPTT: 34 s (ref 24–36)

## 2022-04-11 LAB — CBG MONITORING, ED: Glucose-Capillary: 89 mg/dL (ref 70–99)

## 2022-04-11 MED ORDER — SODIUM CHLORIDE 0.9% FLUSH: 3.0000 mL | Freq: Once | INTRAVENOUS | Status: DC

## 2022-04-11 NOTE — Discharge Instructions (Addendum)
You are seen today for numbness in your left side.  You have had similar symptoms last month and had MRI in multiple CT scans.  It is possibly had a mini stroke but  your risk for having a bigger stroke is a low.  You need to follow-up with a neurologist.  You provided with the information for 1.  Your primary care doctor may need to refer you to them Your CT scan today was reassuring.  it did show some fluid in your sinuses likely from your respiratory virus.  If you get worse send congestion, fevers or have continued symptoms.  Over the next week you should have your primary care doctor recheck you.  Come back to the ER for new or worsening symptoms.

## 2022-04-11 NOTE — ED Triage Notes (Signed)
Episode of numbness on left side. Happened around 3:30pm last ed 1-2 minutes, now resolved. Drove self to ed, ambulatory with steady gait to triage Hx of blood clots- on xarelto was recently switched to eliquis but switched back about 3 days ago.

## 2022-04-11 NOTE — ED Provider Notes (Signed)
[facility] EMERGENCY DEPT Provider Note   [name]: [id number] Arrival date & time: 04/11/22  3149     History  Chief Complaint  Patient presents with   Numbness    David Armstrong is a 71 y.o. male.  He presents the ED for complaint of left arm and leg numbness that started shortly prior to arrival and lasted 1 minute and also affected his left neck and face.  This happened once before on November 4 this year.  He was seen in the ED and had CTA of his head and neck, CT of his chest and MRI of his brain.  These were all reassuring.  He is advised to follow-up outpatient with neurology which she has not done yet.  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  He is nevertheless been compliant with his Xarelto.  He denies chest pain or shortness of breath, no dizziness, no slurred speech.  No blurry vision.  No vertigo.  He does report he has had for 5 days of cough and sinus congestion.  He denies fever or chills.  No nausea or vomiting.  No chest pain, no shortness of breath.  Denies any other symptoms.  HPI     Home Medications Prior to Admission medications   Medication Sig Start Date End Date Taking? Authorizing Provider  desonide (DESOWEN) 0.05 % cream Apply topically 2 (two) times daily. 02/11/22   Vivi Barrack, MD  fluticasone [facility]) 50 MCG/ACT nasal spray Place 2 sprays into both nostrils daily. USE twice a day for 1 week, then reduce to using daily. 12/21/21   Vivi Barrack, MD  hyoscyamine (LEVSIN SL) 0.125 MG SL tablet Take 2 tablets (0.25 mg total) by mouth every 6 (six) hours as needed. DISSOLVE 1 TABLET(0.125 MG) UNDER THE TONGUE EVERY 6 HOURS AS NEEDED Patient taking differently: Take 0.25 mg by mouth every 6 (six)  hours as needed for cramping. DISSOLVE 1 TABLET(0.125 MG) UNDER THE TONGUE EVERY 6 HOURS AS NEEDED 12/20/21   Vivi Barrack, MD  ibandronate (BONIVA) 150 MG tablet Take 150 mg by mouth every 30 (thirty) days. 12/05/21   [provider]  LORazepam (ATIVAN) 1 MG tablet Take 0.5-1 tablets (0.5-1 mg total) by mouth every 8 (eight) hours as needed for anxiety. 05/26/17   Vivi Barrack, MD  omeprazole (PRILOSEC) 20 MG capsule Take 1 capsule (20 mg total) by mouth daily. 12/20/21   Vivi Barrack, MD  ondansetron (ZOFRAN) 4 MG tablet Take 4 mg by mouth every 8 (eight) hours as needed for nausea or vomiting. 01/28/22   [provider]  rivaroxaban (XARELTO) 20 MG TABS tablet Take 1 tablet (20 mg total) by mouth daily with supper. START this only after you have completed the starter pack. 01/26/22   Ghimire, Henreitta Leber, MD  traMADol (ULTRAM) 50 MG tablet Take 50 mg by mouth 3 (three) times daily as needed for moderate pain or severe pain. 10/23/21   [provider]  Vitamin D, Ergocalciferol, (DRISDOL) 1.25 MG (50000 UNIT) CAPS capsule Take 50,000 Units by mouth once a week. 01/10/22   [provider]      Allergies    Effexor xr [venlafaxine hcl er], Acyclovir and related,  Zocor [simvastatin], Dexilant [dexlansoprazole], Ivp dye [iodinated contrast media], Soma [carisoprodol], and Sulfa antibiotics    Review of Systems   Review of Systems  Physical Exam Updated Vital Signs BP (!) 133/93   Pulse 90   Temp 98.9 F (37.2 C)   Resp (!) 21   SpO2 98%  Physical Exam Eyes:     General: No visual field deficit. Neurological:     General: No focal deficit present.     Mental Status: He is oriented to person, place, and time.     GCS: GCS eye subscore is 4. GCS verbal subscore is 5. GCS motor subscore is 6.     Cranial Nerves: No cranial nerve deficit, dysarthria or facial asymmetry.     Motor: No weakness, tremor, abnormal muscle tone or seizure activity.      Coordination: Finger-Nose-Finger Test normal.     ED Results / Procedures / Treatments   Labs (all labs ordered are listed, but only abnormal results are displayed) Labs Reviewed  RESP PANEL BY RT-PCR (RSV, FLU A&B, COVID)  RVPGX2 - Abnormal; Notable for the following components:      Result Value   Resp Syncytial Virus by PCR POSITIVE (*)    All other components within normal limits  PROTIME-INR - Abnormal; Notable for the following components:   Prothrombin Time 20.0 (*)    INR 1.7 (*)    All other components within normal limits  COMPREHENSIVE METABOLIC PANEL - Abnormal; Notable for the following components:   Glucose, Bld 101 (*)    Calcium 8.8 (*)    All other components within normal limits  APTT  CBC  DIFFERENTIAL  ETHANOL  CBG MONITORING, ED    EKG None  Radiology CT HEAD WO CONTRAST  Result Date: 04/11/2022 CLINICAL DATA:  TIA EXAM: CT HEAD WITHOUT CONTRAST TECHNIQUE: Contiguous axial images were obtained from the base of the skull through the vertex without intravenous contrast. RADIATION DOSE REDUCTION: This exam was performed according to the departmental dose-optimization program which includes automated exposure control, adjustment of the mA and/or kV according to patient size and/or use of iterative reconstruction technique. COMPARISON:  MRI Brain 02/15/22 FINDINGS: Brain: No evidence of acute infarction, hemorrhage, hydrocephalus, extra-axial collection or mass lesion/mass effect. Vascular: No hyperdense vessel or unexpected calcification. Skull: Normal. Negative for fracture or focal lesion. Sinuses/Orbits: Air-fluid levels in the bilateral maxillary and right sphenoid sinus. Mucosal thickening bilateral ethmoid sinuses. Bilateral lens replacement. Mastoid air cells are clear. Other: None. IMPRESSION: 1. No hemorrhage or CT evidence of an acute infarct. 2. Air-fluid levels in the bilateral maxillary and right sphenoid sinus. Correlate for acute sinusitis.  Electronically Signed   By: Marin Roberts M.D.   On: 04/11/2022 16:50    Procedures Procedures    Medications Ordered in ED Medications  sodium chloride flush (NS) 0.9 % injection 3 mL (has no administration in time range)    ED Course/ Medical Decision Making/ A&P                           Medical Decision Making This patient presents to the ED for concern of left-sided numbness lasting 1 minute completely resolved, this involves an extensive number of treatment options, and is a complaint that carries with it a high risk of complications and morbidity.  The differential diagnosis includes CVA, TIA, radiculopathy, other   Co morbidities that complicate the patient evaluation  Osteoporosis with history of multiple vertebral  pression fractures   Additional history obtained:  Additional history obtained from EMR External records from outside source obtained and reviewed including outpatient pain specialist notes   Lab Tests:  I Ordered, and personally interpreted labs.  The pertinent results include: Also for RSV, otherwise reassuring.  INR is appropriate given patient's DOAC use  Imaging Studies ordered:  I ordered imaging studies including CT head I independently visualized and interpreted imaging which showed no acute intracranial abnormality, air-fluid levels of the sinuses which is consistent with his current respiratory illness I agree with the radiologist interpretation   Cardiac Monitoring: / EKG:  The patient was maintained on a cardiac monitor.  I personally viewed and interpreted the cardiac monitored which showed an underlying rhythm of: Sinus rhythm    Problem List / ED Course / Critical interventions / Medication management  Seizures to left upper body-patient was had this once before, had extensive workup including MRI and CTA of his head neck and chest.  These were all reassuring.  Patient is already on a DOAC.  History oncological exam is completely  normal he has got normal strength in bilateral upper EXTR and lower extremities.  His ABCD2 score was calculated and is only 1 which is because of his age.  Blood pressure is well-controlled.  Given his completely normal exam and otherwise reassuring workup I do not feel admission is indicated at this time.  Discussed with my attending who felt this was reasonable as well.  I discussed with patient he really does need to follow-up closely with neurology.  He was provided with outpatient referral and advised to follow-up with PCP in the meantime in case he needs referral from them. discussed with patient that he likely has bronchitis secondary to his RSV which is viral illness, given that he is well-appearing with reassuring vitals and exam no specific treatment needed but if his symptoms are persisting or worsening he needs to follow-up with his PCP.  He is given strict return precautions.  I have reviewed the patients home medicines and have made adjustments as needed    Test / Admission - Considered:  Considered admission for possible TIA but patient has low ABCD 2 score, he does not have A-fib, he is already on anticoagulants.  His blood pressure is well-controlled.  He had a recent MRI and CTA of his head and neck.  Do not feel admission would change patient's outcome.  He does have a PCP he can follow-up closely with and was given neurology follow-up as an outpatient since he had not followed up previously. I also consider chest x-ray but patient has mild cough likely due to his RSV, he has no fevers, normal lung sounds, satting 98% on room air, he is not having shortness of breath or chest pain.  Do not feel chest x-ray is indicated at this time.  Do not suspect pneumonia,     Amount and/or Complexity of Data Reviewed Labs: ordered. Radiology: ordered.           Final Clinical Impression(s) / ED Diagnoses Final diagnoses:  None    Rx / DC Orders ED Discharge Orders     None          Darci Current 04/11/22 2236    Charlesetta Shanks, MD 04/23/22 1430

## 2022-04-11 NOTE — ED Notes (Signed)
Discharge paperwork given and verbally understood. 

## 2022-04-17 ENCOUNTER — Encounter: Payer: Self-pay | Admitting: Family

## 2022-04-17 ENCOUNTER — Ambulatory Visit (INDEPENDENT_AMBULATORY_CARE_PROVIDER_SITE_OTHER): Payer: Medicare Other | Admitting: Family

## 2022-04-17 VITALS — BP 136/87 | HR 82 | Temp 98.0°F | Ht 69.0 in | Wt 147.5 lb

## 2022-04-17 DIAGNOSIS — J210 Acute bronchiolitis due to respiratory syncytial virus: Secondary | ICD-10-CM

## 2022-04-17 DIAGNOSIS — M549 Dorsalgia, unspecified: Secondary | ICD-10-CM | POA: Diagnosis not present

## 2022-04-17 NOTE — Progress Notes (Signed)
Patient ID: David Armstrong, male    DOB: 10-03-1950, 72 y.o.   MRN: [id number]  Chief Complaint  Patient presents with   Follow-up    Pt was seen in ED on 12/29 and diagnosed wit RSV, Pt states he is still having some congestion.   Back Pain    Pt c/o upper left back pain since yesterday. Has not tried anything for the pain.     HPI:      Back pain:  has chronic pain, but has been aggravated with recent coughing. He took 1/2 of a tizanidine pill which helped a little but made him so sleepy.  Unable to take NSAIDs due to Xarelto and Tylenol normally does not help.  RSV:  seen in ED 6 days ago and tested positive for RSV.  Symptoms included cough, congestion, scratchy throat, light greenish sputum, nasal drainage, mucus in back of throat. Denies any fever or body aches. States he thinks his cough is not as bad.      Assessment & Plan:  1. Other acute back pain - aggravated by recent coughing - he recently was on prednisone for a scalp rash which most likely helped his pain as well, advised he has Tramadol and ok to take this for his pain, reminded pt to not drive while taking. Also recommend using heating pad for up to 30 min tid and/or analgesic creams/patches, OTC tid.  2. RSV (acute bronchiolitis due to respiratory syncytial virus) -  lungs clear;  seen in ER for neuropathy sx and mentioned he had been coughing for 5d, tested & positive. Reports overall sx are improving, advised on using his Flonase nasal spray to help sinus sx & postnasal drip that may be contributing to his cough. Drink plenty of fluids. Call back in a week if sx are still not improving.   Subjective:    Outpatient Medications Prior to Visit  Medication Sig Dispense Refill   desonide (DESOWEN) 0.05 % cream Apply topically 2 (two) times daily. 30 g 0   fluticasone (FLONASE) 50 MCG/ACT nasal spray Place 2 sprays into both nostrils daily. USE twice a day for 1 week, then reduce to using daily. 16 g 6   hyoscyamine  (LEVSIN SL) 0.125 MG SL tablet Take 2 tablets (0.25 mg total) by mouth every 6 (six) hours as needed. DISSOLVE 1 TABLET(0.125 MG) UNDER THE TONGUE EVERY 6 HOURS AS NEEDED (Patient taking differently: Take 0.25 mg by mouth every 6 (six) hours as needed for cramping. DISSOLVE 1 TABLET(0.125 MG) UNDER THE TONGUE EVERY 6 HOURS AS NEEDED) 120 tablet 3   ibandronate (BONIVA) 150 MG tablet Take 150 mg by mouth every 30 (thirty) days.     LORazepam (ATIVAN) 1 MG tablet Take 0.5-1 tablets (0.5-1 mg total) by mouth every 8 (eight) hours as needed for anxiety. 90 tablet 0   omeprazole (PRILOSEC) 20 MG capsule Take 1 capsule (20 mg total) by mouth daily. 90 capsule 3   ondansetron (ZOFRAN) 4 MG tablet Take 4 mg by mouth every 8 (eight) hours as needed for nausea or vomiting.     rivaroxaban (XARELTO) 20 MG TABS tablet Take 1 tablet (20 mg total) by mouth daily with supper. START this only after you have completed the starter pack. 90 tablet 1   traMADol (ULTRAM) 50 MG tablet Take 50 mg by mouth 3 (three) times daily as needed for moderate pain or severe pain.     Vitamin D, Ergocalciferol, (DRISDOL) 1.25 MG (50000 UNIT)  CAPS capsule Take 50,000 Units by mouth once a week.     No facility-administered medications prior to visit.   Past Medical History:  Diagnosis Date   Anxiety    Arthritis    "lower back; hips;' right knee"    Bipolar disorder ([place])    Chronic constipation    Depression    Diverticulosis of colon    Edema of both lower extremities    ANKLES   Fatty liver    GERD (gastroesophageal reflux disease)    Hiatal hernia    History of anal fissures    History of deep vein thrombosis (DVT) of lower extremity 02/11/2016   right lower extremity distal and proximal extensive acute   History of Helicobacter pylori infection 2012; 2009   History of kidney stones    History of pneumonia 01/2015   CAP   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    Hyperlipidemia    IBS  (irritable bowel syndrome)    Left ureteral stone    Nocturia    Peripheral neuropathy    per pt due to back DDD   Personal history of colonic polyps    HYPERPLASTIC POLYP   Pulmonary nodule, right    incidental finding CT chest angio 02-11-2016 stable   TMJ (temporomandibular joint syndrome)    Varicosities of leg    Past Surgical History:  Procedure Laterality Date   BELPHAROPTOSIS REPAIR Bilateral 01/2015   CATARACT EXTRACTION W/ INTRAOCULAR LENS  IMPLANT, BILATERAL  04/2016   COLONOSCOPY WITH ESOPHAGOGASTRODUODENOSCOPY (EGD)  last one 03-02-2013   CYSTOSCOPY/RETROGRADE/URETEROSCOPY/STONE EXTRACTION WITH BASKET  2010   CYSTOSCOPY/URETEROSCOPY/HOLMIUM LASER/STENT PLACEMENT Left 03/16/2017   Procedure: CYSTOSCOPY/RETROGRADE/URETEROSCOPY/HOLMIUM LASER/STENT PLACEMENT, STONE BASKET EXTRACTION AND STENT PLACEMENT;  Surgeon: [name], MD;  Location: [facility];  Service: Urology;  Laterality: Left;   EXCISIONAL HEMORRHOIDECTOMY  2015 approx.   EXTRACORPOREAL SHOCK WAVE LITHOTRIPSY  1987; 1992   FOOT NEUROMA SURGERY Bilateral left 09/2015; right 04/ 2017   morton' neuroma   FOOT NEUROMA SURGERY Right 2004   morton's neuroma and removal heel spur   HAMMER TOE SURGERY Left 2013   5th toe   INCISION AND DRAINAGE FOOT Left 1992   "ball of foot; stepped on piece of hard wire & it went thru my shoe"   IR ANGIOGRAM PULMONARY BILATERAL SELECTIVE  12/24/2021   IR ANGIOGRAM SELECTIVE EACH ADDITIONAL VESSEL  12/24/2021   IR ANGIOGRAM SELECTIVE EACH ADDITIONAL VESSEL  12/24/2021   IR INFUSION THROMBOL ARTERIAL INITIAL (MS)  12/24/2021   IR INFUSION THROMBOL ARTERIAL INITIAL (MS)  12/24/2021   IR THROMB F/U EVAL ART/VEN FINAL DAY (MS)  12/25/2021   IR US GUIDE VASC ACCESS RIGHT  12/24/2021   KNEE ARTHROSCOPY Bilateral left x2 1997;  right 2004 & 2005   NASAL FRACTURE SURGERY  1997   PATELLA-FEMORAL ARTHROPLASTY Right 07-06-2003   dr Eddie Dibbles [facility]   PLANTAR FASCIA RELEASE Bilateral  1987 & 1993   RHINOPLASTY  2000   SHOULDER ARTHROSCOPY Left 2012;  2013   SHOULDER ARTHROSCOPY WITH DISTAL CLAVICLE RESECTION Right 07-31-2005   dr Percell Miller  [facility]   w/ Debridement labral and adhesions, acromioplasty, bursectomy, and CA ligament release   TRANSTHORACIC ECHOCARDIOGRAM  02/12/2016   ef 88-41%, grade 1 diastolic dysfunction/  trivial TR   Allergies  Allergen Reactions   Effexor Xr [Venlafaxine Hcl Er] Other (See Comments)    Insomnia   Acyclovir And Related    Zocor [Simvastatin] Other (  See Comments)    Muscle ache/ pain   Dexilant [Dexlansoprazole] Other (See Comments)    Muscle aches   Ivp Dye [Iodinated Contrast Media] Rash   Soma [Carisoprodol] Other (See Comments)    Sores on arm   Sulfa Antibiotics Rash      Objective:    Physical Exam Vitals and nursing note reviewed.  Constitutional:      General: He is not in acute distress.    Appearance: Normal appearance.  HENT:     Head: Normocephalic.  Cardiovascular:     Rate and Rhythm: Normal rate and regular rhythm.  Pulmonary:     Effort: Pulmonary effort is normal.     Breath sounds: Normal breath sounds.  Musculoskeletal:        General: Normal range of motion.     Cervical back: Normal range of motion.  Skin:    General: Skin is warm and dry.  Neurological:     Mental Status: He is alert and oriented to person, place, and time.  Psychiatric:        Mood and Affect: Mood normal.    BP 136/87 (BP Location: Left Arm, Patient Position: Sitting, Cuff Size: Large)   Pulse 82   Temp 98 F (36.7 C) (Temporal)   Ht 4' (1.219 m)   Wt 147 lb 8 oz (66.9 kg)   SpO2 98%   BMI 45.01 kg/m  Wt Readings from Last 3 Encounters:  04/17/22 147 lb 8 oz (66.9 kg)  02/24/22 147 lb 3.2 oz (66.8 kg)  02/11/22 150 lb 3.2 oz (68.1 kg)      Jeanie Sewer, NP

## 2022-04-17 NOTE — Patient Instructions (Signed)
It was very nice to see you today!   For your back and upper back/lower neck pain try to use a heating pad for up to 30 minutes, several times per day. You can also apply over the counter Lidocaine, Tiger balm, or biofreeze pain cream or patches as needed. OK to take your Tramadol pain medicine as directed on the bottle.  Remember to drink up to 8 cups of water daily to thin your nasal and lung mucus. Restart your Flonase nasal spray once a day for the next week or 2 to help with your sinus symptoms and postnasal drip that may be contributing to your cough.  Call back in another week if your sx are not improving!       PLEASE NOTE:  If you had any lab tests please let us know if you have not heard back within a few days. You may see your results on MyChart before we have a chance to review them but we will give you a call once they are reviewed by [place]. If we ordered any referrals today, please let us know if you have not heard from their office within the next week.

## 2022-04-21 DIAGNOSIS — M9903 Segmental and somatic dysfunction of lumbar region: Secondary | ICD-10-CM | POA: Diagnosis not present

## 2022-04-21 DIAGNOSIS — M9905 Segmental and somatic dysfunction of pelvic region: Secondary | ICD-10-CM | POA: Diagnosis not present

## 2022-04-21 DIAGNOSIS — M5137 Other intervertebral disc degeneration, lumbosacral region: Secondary | ICD-10-CM | POA: Diagnosis not present

## 2022-04-21 DIAGNOSIS — M9904 Segmental and somatic dysfunction of sacral region: Secondary | ICD-10-CM | POA: Diagnosis not present

## 2022-04-22 DIAGNOSIS — M9905 Segmental and somatic dysfunction of pelvic region: Secondary | ICD-10-CM | POA: Diagnosis not present

## 2022-04-22 DIAGNOSIS — M9904 Segmental and somatic dysfunction of sacral region: Secondary | ICD-10-CM | POA: Diagnosis not present

## 2022-04-22 DIAGNOSIS — M9903 Segmental and somatic dysfunction of lumbar region: Secondary | ICD-10-CM | POA: Diagnosis not present

## 2022-04-22 DIAGNOSIS — M5137 Other intervertebral disc degeneration, lumbosacral region: Secondary | ICD-10-CM | POA: Diagnosis not present

## 2022-04-24 DIAGNOSIS — M9904 Segmental and somatic dysfunction of sacral region: Secondary | ICD-10-CM | POA: Diagnosis not present

## 2022-04-24 DIAGNOSIS — M9903 Segmental and somatic dysfunction of lumbar region: Secondary | ICD-10-CM | POA: Diagnosis not present

## 2022-04-24 DIAGNOSIS — M5137 Other intervertebral disc degeneration, lumbosacral region: Secondary | ICD-10-CM | POA: Diagnosis not present

## 2022-04-24 DIAGNOSIS — M9905 Segmental and somatic dysfunction of pelvic region: Secondary | ICD-10-CM | POA: Diagnosis not present

## 2022-04-28 DIAGNOSIS — M9904 Segmental and somatic dysfunction of sacral region: Secondary | ICD-10-CM | POA: Diagnosis not present

## 2022-04-28 DIAGNOSIS — M9905 Segmental and somatic dysfunction of pelvic region: Secondary | ICD-10-CM | POA: Diagnosis not present

## 2022-04-28 DIAGNOSIS — M5137 Other intervertebral disc degeneration, lumbosacral region: Secondary | ICD-10-CM | POA: Diagnosis not present

## 2022-04-28 DIAGNOSIS — M9903 Segmental and somatic dysfunction of lumbar region: Secondary | ICD-10-CM | POA: Diagnosis not present

## 2022-04-29 DIAGNOSIS — H01019 Ulcerative blepharitis unspecified eye, unspecified eyelid: Secondary | ICD-10-CM | POA: Diagnosis not present

## 2022-04-30 DIAGNOSIS — M9905 Segmental and somatic dysfunction of pelvic region: Secondary | ICD-10-CM | POA: Diagnosis not present

## 2022-04-30 DIAGNOSIS — M5137 Other intervertebral disc degeneration, lumbosacral region: Secondary | ICD-10-CM | POA: Diagnosis not present

## 2022-04-30 DIAGNOSIS — M9903 Segmental and somatic dysfunction of lumbar region: Secondary | ICD-10-CM | POA: Diagnosis not present

## 2022-04-30 DIAGNOSIS — M9904 Segmental and somatic dysfunction of sacral region: Secondary | ICD-10-CM | POA: Diagnosis not present

## 2022-05-01 DIAGNOSIS — M9903 Segmental and somatic dysfunction of lumbar region: Secondary | ICD-10-CM | POA: Diagnosis not present

## 2022-05-01 DIAGNOSIS — M5137 Other intervertebral disc degeneration, lumbosacral region: Secondary | ICD-10-CM | POA: Diagnosis not present

## 2022-05-01 DIAGNOSIS — M9905 Segmental and somatic dysfunction of pelvic region: Secondary | ICD-10-CM | POA: Diagnosis not present

## 2022-05-01 DIAGNOSIS — M9904 Segmental and somatic dysfunction of sacral region: Secondary | ICD-10-CM | POA: Diagnosis not present

## 2022-05-06 DIAGNOSIS — H01003 Unspecified blepharitis right eye, unspecified eyelid: Secondary | ICD-10-CM | POA: Diagnosis not present

## 2022-05-15 DIAGNOSIS — H02889 Meibomian gland dysfunction of unspecified eye, unspecified eyelid: Secondary | ICD-10-CM | POA: Diagnosis not present

## 2022-05-15 DIAGNOSIS — H01003 Unspecified blepharitis right eye, unspecified eyelid: Secondary | ICD-10-CM | POA: Diagnosis not present

## 2022-05-19 DIAGNOSIS — M546 Pain in thoracic spine: Secondary | ICD-10-CM | POA: Diagnosis not present

## 2022-05-19 DIAGNOSIS — M5451 Vertebrogenic low back pain: Secondary | ICD-10-CM | POA: Diagnosis not present

## 2022-05-19 DIAGNOSIS — G8929 Other chronic pain: Secondary | ICD-10-CM | POA: Diagnosis not present

## 2022-05-20 ENCOUNTER — Encounter: Payer: Self-pay | Admitting: Family Medicine

## 2022-05-20 ENCOUNTER — Ambulatory Visit (INDEPENDENT_AMBULATORY_CARE_PROVIDER_SITE_OTHER): Payer: Medicare Other | Admitting: Family Medicine

## 2022-05-20 ENCOUNTER — Other Ambulatory Visit: Payer: Self-pay | Admitting: Physical Medicine & Rehabilitation

## 2022-05-20 VITALS — BP 120/78 | HR 74 | Temp 97.8°F | Ht 69.0 in | Wt 146.5 lb

## 2022-05-20 DIAGNOSIS — M5451 Vertebrogenic low back pain: Secondary | ICD-10-CM

## 2022-05-20 DIAGNOSIS — R739 Hyperglycemia, unspecified: Secondary | ICD-10-CM | POA: Diagnosis not present

## 2022-05-20 DIAGNOSIS — M546 Pain in thoracic spine: Secondary | ICD-10-CM

## 2022-05-20 DIAGNOSIS — G4730 Sleep apnea, unspecified: Secondary | ICD-10-CM | POA: Diagnosis not present

## 2022-05-20 DIAGNOSIS — M899 Disorder of bone, unspecified: Secondary | ICD-10-CM | POA: Diagnosis not present

## 2022-05-20 DIAGNOSIS — M810 Age-related osteoporosis without current pathological fracture: Secondary | ICD-10-CM

## 2022-05-20 DIAGNOSIS — E538 Deficiency of other specified B group vitamins: Secondary | ICD-10-CM

## 2022-05-20 DIAGNOSIS — R5383 Other fatigue: Secondary | ICD-10-CM | POA: Diagnosis not present

## 2022-05-20 DIAGNOSIS — F411 Generalized anxiety disorder: Secondary | ICD-10-CM | POA: Diagnosis not present

## 2022-05-20 DIAGNOSIS — F320 Major depressive disorder, single episode, mild: Secondary | ICD-10-CM | POA: Diagnosis not present

## 2022-05-20 LAB — COMPREHENSIVE METABOLIC PANEL
ALT: 8 U/L (ref 0–53)
AST: 16 U/L (ref 0–37)
Albumin: 3.8 g/dL (ref 3.5–5.2)
Alkaline Phosphatase: 82 U/L (ref 39–117)
BUN: 9 mg/dL (ref 6–23)
CO2: 25 mEq/L (ref 19–32)
Calcium: 8.7 mg/dL (ref 8.4–10.5)
Chloride: 109 mEq/L (ref 96–112)
Creatinine, Ser: 0.73 mg/dL (ref 0.40–1.50)
GFR: 91.76 mL/min (ref >60.00)
Glucose, Bld: 91 mg/dL (ref 70–99)
Potassium: 4.1 mEq/L (ref 3.5–5.1)
Sodium: 144 mEq/L (ref 135–145)
Total Bilirubin: 0.6 mg/dL (ref 0.2–1.2)
Total Protein: 6 g/dL (ref 6.0–8.3)

## 2022-05-20 LAB — CBC
HCT: 40.5 % (ref 39.0–52.0)
Hemoglobin: 13.5 g/dL (ref 13.0–17.0)
MCHC: 33.5 g/dL (ref 30.0–36.0)
MCV: 88 fl (ref 78.0–100.0)
Platelets: 222 10*3/uL (ref 150.0–400.0)
RBC: 4.6 Mil/uL (ref 4.22–5.81)
RDW: 13.6 % (ref 11.5–15.5)
WBC: 4.6 10*3/uL (ref 4.0–10.5)

## 2022-05-20 LAB — TSH: TSH: 4.63 u[IU]/mL (ref 0.35–5.50)

## 2022-05-20 LAB — HEMOGLOBIN A1C: Hgb A1c MFr Bld: 5.2 % (ref 4.6–6.5)

## 2022-05-20 LAB — VITAMIN D 25 HYDROXY (VIT D DEFICIENCY, FRACTURES): VITD: 38.82 ng/mL (ref 30.00–100.00)

## 2022-05-20 LAB — VITAMIN B12: Vitamin B-12: 319 pg/mL (ref 211–911)

## 2022-05-20 NOTE — Assessment & Plan Note (Signed)
Concern for OSA as above. Will refer for sleep study as above.

## 2022-05-20 NOTE — Progress Notes (Signed)
   David Armstrong is a 72 y.o. male who presents today for an office visit.  Assessment/Plan:  New/Acute Problems: Other Fatigue  Strong suspicion for OSA. Recommended we proceed with sleep study however patient is hesitant. We will check labs today to rule out other causes. Discussed importance of diagnosis for sleep apnea.  He is agreeable for [place] to place referral today and he will strongly consider scheduling soon.   Chronic Problems Addressed Today: Depression, major, single episode, mild ([place]) Follows with psychiatry for this. Will be following up with them soon. Hopefully treatment for his fatigue will help with depressive symptoms as well.   Anxiety state Follows with psychiatry for this. He is on ativan 0.5-'1mg'$  nightly.   B12 deficiency Could be contributing to his fatigue. Will check B12 today.   Sleep-disordered breathing Concern for OSA as above. Will refer for sleep study as above.   Osteoporosis On boniva per orthopedics. Check vitamin D.      Subjective:  HPI:  See A/p for status of chronic conditions.   His main concern today is fatigue.  Feels very tired during the day.  This has been going on for over year. Symptoms seem to be about the same over that time. Sometimes doses off during the day.  He does snore. No PND. No witnessed apneic episodes. Sleep does not feeling refreshing.   He is still having issue with fatigue. Pain is a little better but still persistent. He has been following with the neurosurgeon and they have discussed doing an ablation.   He was seen here about a month ago by a a different provider for RSV. He was having URI symptoms including cough and congestion. This has resolved.        Objective:  Physical Exam: BP 120/78 (BP Location: Left Arm, Patient Position: Sitting, Cuff Size: Normal)   Pulse 74   Temp 97.8 F (36.6 C) (Temporal)   Ht '5\' 9"'$  (1.753 m)   Wt 146 lb 8 oz (66.5 kg)   SpO2 98%   BMI 21.63 kg/m   Gen: No acute  distress, resting comfortably Neuro: Grossly normal, moves all extremities Psych: Normal affect and thought content  Time Spent: 40 minutes of total time was spent on the date of the encounter performing the following actions: chart review prior to seeing the patient, obtaining history, performing a medically necessary exam, counseling on the treatment plan including importance of sleep study, placing orders, and documenting in our EHR.        [name]. [name], MD 05/20/2022 10:36 AM

## 2022-05-20 NOTE — Patient Instructions (Addendum)
It was very nice to see you today!  We will check labs today.  I think you probably have sleep apnea.   We will refer you for a sleep study.   Take care, Dr [name]  PLEASE NOTE:  If you had any lab tests, please let us know if you have not heard back within a few days. You may see your results on mychart before we have a chance to review them but we will give you a call once they are reviewed by [place].   If we ordered any referrals today, please let us know if you have not heard from their office within the next week.   If you had any urgent prescriptions sent in today, please check with the pharmacy within an hour of our visit to make sure the prescription was transmitted appropriately.   Please try these tips to maintain a healthy lifestyle:  Eat at least 3 REAL meals and 1-2 snacks per day.  Aim for no more than 5 hours between eating.  If you eat breakfast, please do so within one hour of getting up.   Each meal should contain half fruits/vegetables, one quarter protein, and one quarter carbs (no bigger than a computer mouse)  Cut down on sweet beverages. This includes juice, soda, and sweet tea.   Drink at least 1 glass of water with each meal and aim for at least 8 glasses per day  Exercise at least 150 minutes every week.

## 2022-05-20 NOTE — Assessment & Plan Note (Signed)
Could be contributing to his fatigue. Will check B12 today.

## 2022-05-20 NOTE — Assessment & Plan Note (Signed)
On boniva per orthopedics. Check vitamin D.

## 2022-05-20 NOTE — Assessment & Plan Note (Signed)
Follows with psychiatry for this. Will be following up with them soon. Hopefully treatment for his fatigue will help with depressive symptoms as well.

## 2022-05-20 NOTE — Assessment & Plan Note (Signed)
Follows with psychiatry for this. He is on ativan 0.5-'1mg'$  nightly.

## 2022-05-22 NOTE — Progress Notes (Signed)
Please inform patient of the following:  Okay labs are all stable.  No obvious explanation for his fatigue.  We need to get a sleep study as we discussed at his office visit.  They should be calling him to schedule this soon.

## 2022-05-23 DIAGNOSIS — R310 Gross hematuria: Secondary | ICD-10-CM | POA: Diagnosis not present

## 2022-05-23 DIAGNOSIS — N200 Calculus of kidney: Secondary | ICD-10-CM | POA: Diagnosis not present

## 2022-05-25 ENCOUNTER — Emergency Department (HOSPITAL_BASED_OUTPATIENT_CLINIC_OR_DEPARTMENT_OTHER)
Admission: EM | Admit: 2022-05-25 | Discharge: 2022-05-25 | Disposition: A | Discharge status: Home / Self Care | Payer: Medicare Other | Attending: Emergency Medicine | Admitting: Emergency Medicine

## 2022-05-25 ENCOUNTER — Emergency Department (HOSPITAL_BASED_OUTPATIENT_CLINIC_OR_DEPARTMENT_OTHER): Payer: Medicare Other

## 2022-05-25 ENCOUNTER — Other Ambulatory Visit: Payer: Self-pay

## 2022-05-25 DIAGNOSIS — R109 Unspecified abdominal pain: Secondary | ICD-10-CM | POA: Diagnosis present

## 2022-05-25 DIAGNOSIS — N201 Calculus of ureter: Secondary | ICD-10-CM

## 2022-05-25 DIAGNOSIS — N132 Hydronephrosis with renal and ureteral calculous obstruction: Secondary | ICD-10-CM | POA: Insufficient documentation

## 2022-05-25 LAB — COMPREHENSIVE METABOLIC PANEL
ALT: 9 U/L (ref 0–44)
AST: 18 U/L (ref 15–41)
Albumin: 3.7 g/dL (ref 3.5–5.0)
Alkaline Phosphatase: 76 U/L (ref 38–126)
Anion gap: 9 (ref 5–15)
BUN: 8 mg/dL (ref 8–23)
CO2: 24 mmol/L (ref 22–32)
Calcium: 8.7 mg/dL — ABNORMAL LOW (ref 8.9–10.3)
Chloride: 107 mmol/L (ref 98–111)
Creatinine, Ser: 0.78 mg/dL (ref 0.61–1.24)
GFR, Estimated: >60 mL/min (ref >60)
Glucose, Bld: 101 mg/dL — ABNORMAL HIGH (ref 70–99)
Potassium: 3.9 mmol/L (ref 3.5–5.1)
Sodium: 140 mmol/L (ref 135–145)
Total Bilirubin: 0.8 mg/dL (ref 0.3–1.2)
Total Protein: 6.2 g/dL — ABNORMAL LOW (ref 6.5–8.1)

## 2022-05-25 LAB — URINALYSIS, ROUTINE W REFLEX MICROSCOPIC
Bacteria, UA: NONE SEEN
Bilirubin Urine: NEGATIVE
Glucose, UA: NEGATIVE mg/dL
Ketones, ur: NEGATIVE mg/dL
Leukocytes,Ua: NEGATIVE
Nitrite: NEGATIVE
Protein, ur: NEGATIVE mg/dL
RBC / HPF: >50 RBC/hpf (ref 0–5)
Specific Gravity, Urine: 1.014 (ref 1.005–1.030)
pH: 6 (ref 5.0–8.0)

## 2022-05-25 LAB — CBC
HCT: 38.5 % — ABNORMAL LOW (ref 39.0–52.0)
Hemoglobin: 12.8 g/dL — ABNORMAL LOW (ref 13.0–17.0)
MCH: 28.8 pg (ref 26.0–34.0)
MCHC: 33.2 g/dL (ref 30.0–36.0)
MCV: 86.7 fL (ref 80.0–100.0)
Platelets: 212 10*3/uL (ref 150–400)
RBC: 4.44 MIL/uL (ref 4.22–5.81)
RDW: 13.2 % (ref 11.5–15.5)
WBC: 5.9 10*3/uL (ref 4.0–10.5)
nRBC: 0 % (ref 0.0–0.2)

## 2022-05-25 LAB — LIPASE, BLOOD: Lipase: <10 U/L — ABNORMAL LOW (ref 11–51)

## 2022-05-25 MED ORDER — OXYCODONE-ACETAMINOPHEN 5-325 MG PO TABS: 1.0000 | ORAL_TABLET | Freq: Once | ORAL | Status: DC

## 2022-05-25 MED ORDER — TAMSULOSIN HCL 0.4 MG PO CAPS: 0.4000 mg | ORAL_CAPSULE | Freq: Every day | ORAL | 0 refills | Status: AC

## 2022-05-25 MED ORDER — SENNOSIDES-DOCUSATE SODIUM 8.6-50 MG PO TABS: 1.0000 | ORAL_TABLET | Freq: Every evening | ORAL | 0 refills | Status: DC | PRN

## 2022-05-25 MED ORDER — HYDROMORPHONE HCL 1 MG/ML IJ SOLN
1.0000 mg | Freq: Once | INTRAMUSCULAR | Status: AC
  Administered 2022-05-25: 1 mg via INTRAVENOUS
  Filled 2022-05-25: qty 1

## 2022-05-25 MED ORDER — ONDANSETRON HCL 4 MG/2ML IJ SOLN
4.0000 mg | Freq: Once | INTRAMUSCULAR | Status: AC
  Administered 2022-05-25: 4 mg via INTRAVENOUS
  Filled 2022-05-25: qty 2

## 2022-05-25 MED ORDER — OXYCODONE-ACETAMINOPHEN 5-325 MG PO TABS: 1.0000 | ORAL_TABLET | Freq: Four times a day (QID) | ORAL | 0 refills | Status: DC | PRN

## 2022-05-25 NOTE — ED Provider Notes (Signed)
Emergency Department Provider Note   I have reviewed the triage vital signs and the nursing notes.   HISTORY  Chief Complaint Flank Pain   HPI David Armstrong is a 72 y.o. male with PMH of kidney stones presents to the ED with several days of left flank pain. No fever. No UTI symptoms. He is established with [facility] from prior stones. No vomiting. No CP or SOB.    Past Medical History:  Diagnosis Date   Anxiety    Arthritis    "lower back; hips;' right knee"    Bipolar disorder ([place])    Chronic constipation    Depression    Diverticulosis of colon    Edema of both lower extremities    ANKLES   Fatty liver    GERD (gastroesophageal reflux disease)    Hiatal hernia    History of anal fissures    History of deep vein thrombosis (DVT) of lower extremity 02/11/2016   right lower extremity distal and proximal extensive acute   History of Helicobacter pylori infection 2012; 2009   History of kidney stones    History of pneumonia 01/2015   CAP   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    Hyperlipidemia    IBS (irritable bowel syndrome)    Left ureteral stone    Nocturia    Peripheral neuropathy    per pt due to back DDD   Personal history of colonic polyps    HYPERPLASTIC POLYP   Pulmonary nodule, right    incidental finding CT chest angio 02-11-2016 stable   TMJ (temporomandibular joint syndrome)    Varicosities of leg     Review of Systems  Constitutional: No fever/chills Eyes: No visual changes. ENT: No sore throat. Cardiovascular: Denies chest pain. Respiratory: Denies shortness of breath. Gastrointestinal: No abdominal pain.  No nausea, no vomiting.  No diarrhea.  No constipation.Positive left flank pain.  Genitourinary: Negative for dysuria. Musculoskeletal: Negative for back pain. Skin: Negative for rash. Neurological: Negative for headaches, focal weakness or  numbness.  ____________________________________________   PHYSICAL EXAM:  VITAL SIGNS: ED Triage Vitals  Enc Vitals Group     BP 05/25/22 1241 (!) 150/89     Pulse Rate 05/25/22 1241 75     Resp 05/25/22 1241 20     Temp 05/25/22 1241 98.1 F (36.7 C)     Temp Source 05/25/22 1241 Temporal     SpO2 05/25/22 1241 100 %     Weight 05/25/22 1243 145 lb (65.8 kg)     Height 05/25/22 1243 5' 9"$  (1.753 m)   Constitutional: Alert and oriented. Well appearing and in no acute distress. Eyes: Conjunctivae are normal.  Head: Atraumatic. Nose: No congestion/rhinnorhea. Mouth/Throat: Mucous membranes are moist.  Neck: No stridor.   Cardiovascular: Normal rate, regular rhythm. Good peripheral circulation. Grossly normal heart sounds.   Respiratory: Normal respiratory effort.  No retractions. Lungs CTAB. Gastrointestinal: Soft and nontender. No distention.  Musculoskeletal: No lower extremity tenderness nor edema. No gross deformities of extremities. Neurologic:  Normal speech and language. No gross focal neurologic deficits are appreciated.  Skin:  Skin is warm, dry and intact. No rash noted.  ____________________________________________   LABS (all labs ordered are listed, but only abnormal results are displayed)  Labs Reviewed  LIPASE, BLOOD - Abnormal; Notable for the following components:      Result Value   Lipase <10 (*)    All other components within normal limits  COMPREHENSIVE METABOLIC PANEL - Abnormal; Notable for the following components:   Glucose, Bld 101 (*)    Calcium 8.7 (*)    Total Protein 6.2 (*)    All other components within normal limits  CBC - Abnormal; Notable for the following components:   Hemoglobin 12.8 (*)    HCT 38.5 (*)    All other components within normal limits  URINALYSIS, ROUTINE W REFLEX MICROSCOPIC - Abnormal; Notable for the following components:   Hgb urine dipstick LARGE (*)    All other components within normal limits    ____________________________________________  RADIOLOGY  CT Renal Stone Study  Result Date: 05/25/2022 CLINICAL DATA:  Left flank pain. EXAM: CT ABDOMEN AND PELVIS WITHOUT CONTRAST TECHNIQUE: Multidetector CT imaging of the abdomen and pelvis was performed following the standard protocol without IV contrast. RADIATION DOSE REDUCTION: This exam was performed according to the departmental dose-optimization program which includes automated exposure control, adjustment of the mA and/or kV according to patient size and/or use of iterative reconstruction technique. COMPARISON:  07/23/2021. FINDINGS: Lower chest: No acute abnormality. Hepatobiliary: No focal liver abnormality is seen. No gallstones, gallbladder wall thickening, or biliary dilatation. Pancreas: Unremarkable. No pancreatic ductal dilatation or surrounding inflammatory changes. Spleen: Normal in size without focal abnormality. Adrenals/Urinary Tract: Normal adrenal glands. Moderate left hydronephrosis with moderate dilation of the proximal to mid ureter, as well as significant perinephric and Peri ureteral stranding. This is due to a 4 mm stone in the mid left ureter. No additional ureteral stones. No right hydronephrosis. Normal right ureter. Bilateral nonobstructing intrarenal stones. Normal bladder. Stomach/Bowel: Small to moderate-sized hiatal hernia. Stomach otherwise unremarkable. Small bowel and colon are normal in caliber. No wall thickening. No inflammation. Vascular/Lymphatic: Mild aortic atherosclerosis. No aneurysm. Both common iliac arteries are dilated to 1.7 cm. No enlarged lymph nodes. Reproductive: Unremarkable. Other: No abdominal wall hernia or abnormality. No abdominopelvic ascites. Musculoskeletal: Multiple vertebral fractures, T11, T12, L1, L2, L3 and L5. Previous vertebroplasty at T12. L5 fracture, mild in severity, new since the previous CT. T11 fracture has increased in severity. L1 vertebroplasty has been performed since  the prior CT. No other fractures. No suspicious bone lesions. IMPRESSION: 1. 4 mm stone in the mid left ureter causes moderate left hydroureteronephrosis and significant perinephric/periureteral stranding. 2. No other convincing acute abnormality within the abdomen or pelvis. 3. Multiple bilateral nonobstructing intrarenal stones. 4. Mild compression fracture of L5 is new when compared to the CT dated 07/23/2021, chronicity unclear. Increase in severity of the compression deformity of T11 since the prior CT. Electronically Signed   By: Lajean Manes M.D.   On: 05/25/2022 14:02    ____________________________________________   PROCEDURES  Procedure(s) performed:   Procedures  None ____________________________________________   INITIAL IMPRESSION / ASSESSMENT AND PLAN / ED COURSE  Pertinent labs & imaging results that were available during my care of the patient were reviewed by me and considered in my medical decision making (see chart for details).   This patient is Presenting for Evaluation of flank pain, which does require a range of treatment options, and is a complaint that involves a high risk of morbidity and mortality.  The Differential Diagnoses includes but is not exclusive to acute appendicitis, renal colic, testicular torsion, urinary tract infection, prostatitis,  diverticulitis, small bowel obstruction, colitis, abdominal aortic aneurysm, gastroenteritis, constipation etc.   Critical Interventions-    Medications  HYDROmorphone (DILAUDID) injection 1 mg (1 mg Intravenous Given 05/25/22 1328)  ondansetron (ZOFRAN) injection 4 mg (4  mg Intravenous Given 05/25/22 1324)    Reassessment after intervention: Pain improved.   Clinical Laboratory Tests Ordered, included no evidence of UTI on UA. No AKI.   Radiologic Tests Ordered, included CT renal. I independently interpreted the images and agree with radiology interpretation.   Cardiac Monitor Tracing which shows  NSR.   Social Determinants of Health Risk patient is a non-smoker.   Medical Decision Making: Summary:  Patient presents to the ED with symptoms similar to prior episodes of renal colic. No fever or UTI symptoms.   Reevaluation with update and discussion with patient. Pain is well controlled. CT results discussed. Plan for pain mgmt and close Urology follow up in the coming week.   Considered admission but workup is reassuring and pain is well controlled.   Patient's presentation is most consistent with acute presentation with potential threat to life or bodily function.   Disposition: discharge  ____________________________________________  FINAL CLINICAL IMPRESSION(S) / ED DIAGNOSES  Final diagnoses:  Ureteral stone     Note:  This document was prepared using Dragon voice recognition software and may include unintentional dictation errors.  Nanda Quinton, MD, [facility] Emergency Medicine    Duquan Gillooly, Wonda Olds, MD 05/28/22 1226

## 2022-05-25 NOTE — Discharge Instructions (Signed)
You have been seen in the Emergency Department (ED) today for pain that we believe based on your workup, is caused by kidney stones.  As we have discussed, please drink plenty of fluids.  Please make a follow up appointment with the physician(s) listed elsewhere in this documentation.  You may take pain medication as needed but ONLY as prescribed.  Please also take your prescribed Flomax daily.  We also recommend that you take over-the-counter ibuprofen regularly according to label instructions over the next 5 days.  Take it with meals to minimize stomach discomfort.  Please see your doctor as soon as possible as stones may take 1-3 weeks to pass and you may require additional care or medications.  Do not drink alcohol, drive or participate in any other potentially dangerous activities while taking opiate pain medication as it may make you sleepy. Do not take this medication with any other sedating medications, either prescription or over-the-counter. If you were prescribed Percocet or Vicodin, do not take these with acetaminophen (Tylenol) as it is already contained within these medications.   Take Percocet as needed for severe pain.  This medication is an opiate (or narcotic) pain medication and can be habit forming.  Use it as little as possible to achieve adequate pain control.  Do not use or use it with extreme caution if you have a history of opiate abuse or dependence.  If you are on a pain contract with your primary care doctor or a pain specialist, be sure to let them know you were prescribed this medication today from the Emergency Department.  This medication is intended for your use only - do not give any to anyone else and keep it in a secure place where nobody else, especially children, have access to it.  It will also cause or worsen constipation, so you may want to consider taking an over-the-counter stool softener while you are taking this medication.  Return to the Emergency Department  (ED) or call your doctor if you have any worsening pain, fever, painful urination, are unable to urinate, or develop other symptoms that concern you.

## 2022-05-28 DIAGNOSIS — L218 Other seborrheic dermatitis: Secondary | ICD-10-CM | POA: Diagnosis not present

## 2022-05-28 DIAGNOSIS — K130 Diseases of lips: Secondary | ICD-10-CM | POA: Diagnosis not present

## 2022-05-28 DIAGNOSIS — L720 Epidermal cyst: Secondary | ICD-10-CM | POA: Diagnosis not present

## 2022-05-28 DIAGNOSIS — N201 Calculus of ureter: Secondary | ICD-10-CM | POA: Diagnosis not present

## 2022-05-29 DIAGNOSIS — M5412 Radiculopathy, cervical region: Secondary | ICD-10-CM | POA: Diagnosis not present

## 2022-05-29 DIAGNOSIS — G603 Idiopathic progressive neuropathy: Secondary | ICD-10-CM | POA: Diagnosis not present

## 2022-05-29 DIAGNOSIS — M5417 Radiculopathy, lumbosacral region: Secondary | ICD-10-CM | POA: Diagnosis not present

## 2022-06-01 ENCOUNTER — Ambulatory Visit
Admission: RE | Admit: 2022-06-01 | Discharge: 2022-06-01 | Disposition: A | Discharge status: Home / Self Care | Payer: Medicare Other | Source: Ambulatory Visit | Attending: Physical Medicine & Rehabilitation | Admitting: Physical Medicine & Rehabilitation

## 2022-06-01 DIAGNOSIS — M546 Pain in thoracic spine: Secondary | ICD-10-CM

## 2022-06-01 DIAGNOSIS — M5451 Vertebrogenic low back pain: Secondary | ICD-10-CM

## 2022-06-01 DIAGNOSIS — M5136 Other intervertebral disc degeneration, lumbar region: Secondary | ICD-10-CM | POA: Diagnosis not present

## 2022-06-01 DIAGNOSIS — M545 Low back pain, unspecified: Secondary | ICD-10-CM | POA: Diagnosis not present

## 2022-06-01 DIAGNOSIS — M48061 Spinal stenosis, lumbar region without neurogenic claudication: Secondary | ICD-10-CM | POA: Diagnosis not present

## 2022-06-05 DIAGNOSIS — G8929 Other chronic pain: Secondary | ICD-10-CM | POA: Diagnosis not present

## 2022-06-05 DIAGNOSIS — M47814 Spondylosis without myelopathy or radiculopathy, thoracic region: Secondary | ICD-10-CM | POA: Diagnosis not present

## 2022-06-05 DIAGNOSIS — M47817 Spondylosis without myelopathy or radiculopathy, lumbosacral region: Secondary | ICD-10-CM | POA: Diagnosis not present

## 2022-06-08 ENCOUNTER — Other Ambulatory Visit: Payer: Self-pay

## 2022-06-08 ENCOUNTER — Emergency Department (HOSPITAL_BASED_OUTPATIENT_CLINIC_OR_DEPARTMENT_OTHER)
Admission: EM | Admit: 2022-06-08 | Discharge: 2022-06-08 | Disposition: A | Discharge status: Home / Self Care | Payer: Medicare Other | Attending: Emergency Medicine | Admitting: Emergency Medicine

## 2022-06-08 DIAGNOSIS — R531 Weakness: Secondary | ICD-10-CM

## 2022-06-08 DIAGNOSIS — U071 COVID-19: Secondary | ICD-10-CM | POA: Diagnosis not present

## 2022-06-08 DIAGNOSIS — R5383 Other fatigue: Secondary | ICD-10-CM | POA: Diagnosis not present

## 2022-06-08 LAB — RESP PANEL BY RT-PCR (RSV, FLU A&B, COVID)  RVPGX2
Influenza A by PCR: NEGATIVE
Influenza B by PCR: NEGATIVE
Resp Syncytial Virus by PCR: NEGATIVE
SARS Coronavirus 2 by RT PCR: POSITIVE — AB

## 2022-06-08 LAB — URINALYSIS, ROUTINE W REFLEX MICROSCOPIC
Bacteria, UA: NONE SEEN
Bilirubin Urine: NEGATIVE
Glucose, UA: NEGATIVE mg/dL
Ketones, ur: NEGATIVE mg/dL
Leukocytes,Ua: NEGATIVE
Nitrite: NEGATIVE
RBC / HPF: >50 RBC/hpf (ref 0–5)
Specific Gravity, Urine: 1.02 (ref 1.005–1.030)
pH: 6 (ref 5.0–8.0)

## 2022-06-08 LAB — CBC
HCT: 41 % (ref 39.0–52.0)
Hemoglobin: 13.2 g/dL (ref 13.0–17.0)
MCH: 28.6 pg (ref 26.0–34.0)
MCHC: 32.2 g/dL (ref 30.0–36.0)
MCV: 88.9 fL (ref 80.0–100.0)
Platelets: 231 10*3/uL (ref 150–400)
RBC: 4.61 MIL/uL (ref 4.22–5.81)
RDW: 14.2 % (ref 11.5–15.5)
WBC: 6.1 10*3/uL (ref 4.0–10.5)
nRBC: 0 % (ref 0.0–0.2)

## 2022-06-08 LAB — BASIC METABOLIC PANEL
Anion gap: 10 (ref 5–15)
BUN: 18 mg/dL (ref 8–23)
CO2: 27 mmol/L (ref 22–32)
Calcium: 9.7 mg/dL (ref 8.9–10.3)
Chloride: 105 mmol/L (ref 98–111)
Creatinine, Ser: 0.78 mg/dL (ref 0.61–1.24)
GFR, Estimated: >60 mL/min (ref >60)
Glucose, Bld: 94 mg/dL (ref 70–99)
Potassium: 4.1 mmol/L (ref 3.5–5.1)
Sodium: 142 mmol/L (ref 135–145)

## 2022-06-08 LAB — CBG MONITORING, ED: Glucose-Capillary: 93 mg/dL (ref 70–99)

## 2022-06-08 NOTE — ED Provider Notes (Signed)
[facility] Provider Note   [name]: [id number] Arrival date & time: 06/08/22  1422     History  Chief Complaint  Patient presents with   Weakness    David Armstrong is a 72 y.o. male.   Weakness Patient presents with some generalized weakness.  Feeling weak for around the last day.  States he stood up and felt a little off.  Not severely lightheaded.  Not unsteady just a little off.  No localizing numbness or weakness.  States he does still have some blood in the urine.  Recently seen for kidney stone states he passed a stone but thinks there still may some in there since still having some dysuria.  Mild suprapubic pain.    Past Medical History:  Diagnosis Date   Anxiety    Arthritis    "lower back; hips;' right knee"    Bipolar disorder ([place])    Chronic constipation    Depression    Diverticulosis of colon    Edema of both lower extremities    ANKLES   Fatty liver    GERD (gastroesophageal reflux disease)    Hiatal hernia    History of anal fissures    History of deep vein thrombosis (DVT) of lower extremity 02/11/2016   right lower extremity distal and proximal extensive acute   History of Helicobacter pylori infection 2012; 2009   History of kidney stones    History of pneumonia 01/2015   CAP   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    Hyperlipidemia    IBS (irritable bowel syndrome)    Left ureteral stone    Nocturia    Peripheral neuropathy    per pt due to back DDD   Personal history of colonic polyps    HYPERPLASTIC POLYP   Pulmonary nodule, right    incidental finding CT chest angio 02-11-2016 stable   TMJ (temporomandibular joint syndrome)    Varicosities of leg     Home Medications Prior to Admission medications   Medication Sig Start Date End Date Taking? Authorizing Provider  desonide (DESOWEN) 0.05 % cream Apply topically 2 (two) times daily. 02/11/22   Vivi Barrack, MD   doxycycline (ADOXA) 50 MG tablet Take 50 mg by mouth 2 (two) times daily. 05/15/22   [provider]  fluticasone (FLONASE) 50 MCG/ACT nasal spray Place 2 sprays into both nostrils daily. USE twice a day for 1 week, then reduce to using daily. 12/21/21   Vivi Barrack, MD  hyoscyamine (LEVSIN SL) 0.125 MG SL tablet Take 2 tablets (0.25 mg total) by mouth every 6 (six) hours as needed. DISSOLVE 1 TABLET(0.125 MG) UNDER THE TONGUE EVERY 6 HOURS AS NEEDED Patient taking differently: Take 0.25 mg by mouth every 6 (six) hours as needed for cramping. DISSOLVE 1 TABLET(0.125 MG) UNDER THE TONGUE EVERY 6 HOURS AS NEEDED 12/20/21   Vivi Barrack, MD  ibandronate (BONIVA) 150 MG tablet Take 150 mg by mouth every 30 (thirty) days. 12/05/21   [provider]  LORazepam (ATIVAN) 1 MG tablet Take 0.5-1 tablets (0.5-1 mg total) by mouth every 8 (eight) hours as needed for anxiety. 05/26/17   Vivi Barrack, MD  omeprazole (PRILOSEC) 20 MG capsule Take 1 capsule (20 mg total) by mouth daily. 12/20/21   Vivi Barrack, MD  ondansetron (ZOFRAN) 4 MG tablet Take 4 mg by mouth every 8 (eight) hours as needed for nausea or vomiting.  01/28/22   [provider]  oxyCODONE-acetaminophen (PERCOCET/ROXICET) 5-325 MG tablet Take 1 tablet by mouth every 6 (six) hours as needed for severe pain. 05/25/22   Long, Wonda Olds, MD  rivaroxaban (XARELTO) 20 MG TABS tablet Take 1 tablet (20 mg total) by mouth daily with supper. START this only after you have completed the starter pack. 01/26/22   Ghimire, Henreitta Leber, MD  senna-docusate (SENOKOT-S) 8.6-50 MG tablet Take 1 tablet by mouth at bedtime as needed for mild constipation. 05/25/22   Long, Wonda Olds, MD  tamsulosin (FLOMAX) 0.4 MG CAPS capsule Take 1 capsule (0.4 mg total) by mouth daily for 14 days. 05/25/22 06/08/22  Long, Wonda Olds, MD  traMADol (ULTRAM) 50 MG tablet Take 50 mg by mouth 3 (three) times daily as needed for moderate pain or severe pain. 10/23/21    [provider]  triamcinolone cream (KENALOG) 0.1 % Apply 1 Application topically 2 (two) times daily. 04/18/22   [provider]  Vitamin D, Ergocalciferol, (DRISDOL) 1.25 MG (50000 UNIT) CAPS capsule Take 50,000 Units by mouth once a week. 01/10/22   [provider]  XDEMVY 0.25 % SOLN Apply to eye. Patient not taking: Reported on 05/20/2022 05/20/22   [provider]      Allergies    Effexor xr [venlafaxine hcl er], Acyclovir and related, Zocor [simvastatin], Dexilant [dexlansoprazole], Ivp dye [iodinated contrast media], Soma [carisoprodol], and Sulfa antibiotics    Review of Systems   Review of Systems  Neurological:  Positive for weakness.    Physical Exam Updated Vital Signs BP 129/89 (BP Location: Right Arm)   Pulse 88   Temp 97.8 F (36.6 C) (Oral)   Resp 16   Ht '5\' 9"'$  (1.753 m)   Wt 68.5 kg   SpO2 100%   BMI 22.30 kg/m  Physical Exam Vitals and nursing note reviewed.  Cardiovascular:     Rate and Rhythm: Regular rhythm.  Abdominal:     Tenderness: There is abdominal tenderness.     Comments: Mild lower abdominal/suprapubic tenderness.  No rebound or guarding.  No hernia palpated.  Musculoskeletal:        General: No tenderness.     Cervical back: Neck supple.  Skin:    General: Skin is warm.     Capillary Refill: Capillary refill takes less than 2 seconds.  Neurological:     Mental Status: He is alert and oriented to person, place, and time.     ED Results / Procedures / Treatments   Labs (all labs ordered are listed, but only abnormal results are displayed) Labs Reviewed  RESP PANEL BY RT-PCR (RSV, FLU A&B, COVID)  RVPGX2 - Abnormal; Notable for the following components:      Result Value   SARS Coronavirus 2 by RT PCR POSITIVE (*)    All other components within normal limits  URINALYSIS, ROUTINE W REFLEX MICROSCOPIC - Abnormal; Notable for the following components:   Hgb urine dipstick LARGE (*)    Protein, ur TRACE (*)     All other components within normal limits  BASIC METABOLIC PANEL  CBC  CBG MONITORING, ED    EKG EKG Interpretation  Date/Time:  Sunday June 08 2022 14:59:35 EST Ventricular Rate:  85 PR Interval:  178 QRS Duration: 70 QT Interval:  354 QTC Calculation: 421 R Axis:   24 Text Interpretation: Normal sinus rhythm When compared with ECG of 11-Apr-2022 16:14, No significant change since last tracing Confirmed by Davonna Belling [phone]) on  06/08/2022 4:07:13 PM  Radiology No results found.  Procedures Procedures    Medications Ordered in ED Medications - No data to display  ED Course/ Medical Decision Making/ A&P                             Medical Decision Making Amount and/or Complexity of Data Reviewed Labs: ordered.   Patient generalized weakness.  Felt a little lightheaded with standing.  Blood work reviewed and reassuring.  No anemia and white count not elevated.  Urine does show a little bit of blood but no bacteria.  Doubt infection at this time and did have recent passed stone potentially still having some stone.  Will check orthostatic vital signs and flu and COVID testing since he feels generally weak.  Not orthostatic.  COVID test initially not resulted was going to discharge however did result before he had gone.  Had been feeling better.  However COVID test is positive.  Has had been more of a sneezing however for about a week now.  Do not think he is a candidate for Paxlovid at this time although he is high risk.  Will discharge home.        Final Clinical Impression(s) / ED Diagnoses Final diagnoses:  Weakness  COVID    Rx / DC Orders ED Discharge Orders     None         Davonna Belling, MD 06/08/22 2341

## 2022-06-08 NOTE — ED Triage Notes (Signed)
Patient arrives with complaints of increased episodes of weakness x1 day. Patient reports feeling not well overnight.   Reports some abdominal discomfort as well. Hx of kidney stones that he was seen here for as well.

## 2022-06-08 NOTE — ED Notes (Signed)
Discharge paperwork given and verbally understood. 

## 2022-06-09 ENCOUNTER — Telehealth (INDEPENDENT_AMBULATORY_CARE_PROVIDER_SITE_OTHER): Payer: Medicare Other | Admitting: Family Medicine

## 2022-06-09 DIAGNOSIS — S22000A Wedge compression fracture of unspecified thoracic vertebra, initial encounter for closed fracture: Secondary | ICD-10-CM

## 2022-06-09 DIAGNOSIS — N200 Calculus of kidney: Secondary | ICD-10-CM

## 2022-06-09 MED ORDER — MOLNUPIRAVIR 200 MG PO CAPS: 4.0000 | ORAL_CAPSULE | Freq: Two times a day (BID) | ORAL | 0 refills | Status: AC

## 2022-06-09 NOTE — Progress Notes (Signed)
   David Armstrong is a 72 y.o. male who presents today for a virtual office visit.  Assessment/Plan:  New/Acute Problems: COVID No red flags but is at increased risk for complication from COVID. We discussed treatment options. Would be reasonable to start treatment at this point given his comorbidities. His first day of symptoms was probably 2 days ago. He is agreeable to start antiviral. Will start molnupiravir. Encouraged hydration. Can use OTC meds as needed. We discussed reasons to return to care. Follow up as needed.   Chronic Problems Addressed Today: Nephrolithiasis Continue management per urology. Still has some blood in his urine.   Compression fracture of body of thoracic vertebra [facility]) Following with neurosurgery. HE will need to reschedule upcoming appointment due to covid.    Subjective:  HPI:  See A/p for status of chronic conditions.  He is here today for ED follow up. Went to the ED yesterday for generalized weakness. His covid test was positive yesterday. HE was discharged home. His symptoms have been going on for 2 days. No shortness of breath or difficulty breathing. No fevers or chills. HE is having some worsening fatigue and nasal congestion. No specific treatments tried.        Objective/Observations  Physical Exam: Gen: NAD, resting comfortably Pulm: Normal work of breathing Neuro: Grossly normal, moves all extremities Psych: Normal affect and thought content  Virtual Visit via Video   I connected with [name] on 06/09/22 at  2:00 PM EST by a video enabled telemedicine application and verified that I am speaking with the correct person using two identifiers. The limitations of evaluation and management by telemedicine and the availability of in person appointments were discussed. The patient expressed understanding and agreed to proceed.   Patient location: Home Provider location: [place] participating in the  virtual visit: Myself and Patient     [name]. [name], MD 06/09/2022 2:49 PM

## 2022-06-09 NOTE — Assessment & Plan Note (Signed)
Continue management per urology. Still has some blood in his urine.

## 2022-06-09 NOTE — Assessment & Plan Note (Signed)
Following with neurosurgery. HE will need to reschedule upcoming appointment due to covid.

## 2022-06-12 DIAGNOSIS — N201 Calculus of ureter: Secondary | ICD-10-CM | POA: Diagnosis not present

## 2022-06-13 ENCOUNTER — Telehealth: Payer: Self-pay

## 2022-06-13 NOTE — Telephone Encounter (Signed)
     Patient  visit on 2/25  at [facility]   Have you been able to follow up with your primary care physician? Yes   The patient was or was not able to obtain any needed medicine or equipment. Yes   Are there diet recommendations that you are having difficulty following? Na   Patient expresses understanding of discharge instructions and education provided has no other needs at this time.  Yes     [facility] [phone] [address], [place], [place] [address] Phone: [phone] Email: [email or web address]

## 2022-06-16 ENCOUNTER — Other Ambulatory Visit: Payer: Self-pay | Admitting: Family Medicine

## 2022-06-17 DIAGNOSIS — H01019 Ulcerative blepharitis unspecified eye, unspecified eyelid: Secondary | ICD-10-CM | POA: Diagnosis not present

## 2022-06-26 DIAGNOSIS — B078 Other viral warts: Secondary | ICD-10-CM | POA: Diagnosis not present

## 2022-06-26 DIAGNOSIS — R238 Other skin changes: Secondary | ICD-10-CM | POA: Diagnosis not present

## 2022-06-26 DIAGNOSIS — L570 Actinic keratosis: Secondary | ICD-10-CM | POA: Diagnosis not present

## 2022-06-26 DIAGNOSIS — L821 Other seborrheic keratosis: Secondary | ICD-10-CM | POA: Diagnosis not present

## 2022-06-26 DIAGNOSIS — D225 Melanocytic nevi of trunk: Secondary | ICD-10-CM | POA: Diagnosis not present

## 2022-06-26 DIAGNOSIS — L814 Other melanin hyperpigmentation: Secondary | ICD-10-CM | POA: Diagnosis not present

## 2022-06-26 DIAGNOSIS — D492 Neoplasm of unspecified behavior of bone, soft tissue, and skin: Secondary | ICD-10-CM | POA: Diagnosis not present

## 2022-06-26 DIAGNOSIS — C44729 Squamous cell carcinoma of skin of left lower limb, including hip: Secondary | ICD-10-CM | POA: Diagnosis not present

## 2022-07-09 DIAGNOSIS — Z713 Dietary counseling and surveillance: Secondary | ICD-10-CM | POA: Diagnosis not present

## 2022-07-09 DIAGNOSIS — L89899 Pressure ulcer of other site, unspecified stage: Secondary | ICD-10-CM | POA: Diagnosis not present

## 2022-07-24 ENCOUNTER — Ambulatory Visit (INDEPENDENT_AMBULATORY_CARE_PROVIDER_SITE_OTHER): Payer: Medicare Other | Admitting: Family Medicine

## 2022-07-24 VITALS — BP 100/66 | HR 77 | Temp 97.7°F | Ht 69.0 in | Wt 143.0 lb

## 2022-07-24 DIAGNOSIS — S22000A Wedge compression fracture of unspecified thoracic vertebra, initial encounter for closed fracture: Secondary | ICD-10-CM

## 2022-07-24 DIAGNOSIS — J309 Allergic rhinitis, unspecified: Secondary | ICD-10-CM

## 2022-07-24 MED ORDER — BACLOFEN 10 MG PO TABS: 5.0000 mg | ORAL_TABLET | Freq: Three times a day (TID) | ORAL | 0 refills | Status: DC

## 2022-07-24 MED ORDER — AMOXICILLIN-POT CLAVULANATE 875-125 MG PO TABS: 1.0000 | ORAL_TABLET | Freq: Two times a day (BID) | ORAL | 0 refills | Status: DC

## 2022-07-24 MED ORDER — TRAMADOL HCL 50 MG PO TABS: 50.0000 mg | ORAL_TABLET | Freq: Three times a day (TID) | ORAL | 0 refills | Status: DC | PRN

## 2022-07-24 NOTE — Patient Instructions (Addendum)
It was very nice to see you today!  You may have another compression fracture. Please take the tramadol as needed. You may also have some muscle spasm and there is well.  Please take the baclofen 3 times daily as needed.  You probably have a sinus infection.  Please start the Augmentin.  You can use Flonase as needed.  Let me know if not improving in the next couple of weeks.  Take care, Dr Jimmey Ralph  PLEASE NOTE:  If you had any lab tests, please let us know if you have not heard back within a few days. You may see your results on mychart before we have a chance to review them but we will give you a call once they are reviewed by [place].   If we ordered any referrals today, please let us know if you have not heard from their office within the next week.   If you had any urgent prescriptions sent in today, please check with the pharmacy within an hour of our visit to make sure the prescription was transmitted appropriately.   Please try these tips to maintain a healthy lifestyle:  Eat at least 3 REAL meals and 1-2 snacks per day.  Aim for no more than 5 hours between eating.  If you eat breakfast, please do so within one hour of getting up.   Each meal should contain half fruits/vegetables, one quarter protein, and one quarter carbs (no bigger than a computer mouse)  Cut down on sweet beverages. This includes juice, soda, and sweet tea.   Drink at least 1 glass of water with each meal and aim for at least 8 glasses per day  Exercise at least 150 minutes every week.

## 2022-07-24 NOTE — Assessment & Plan Note (Signed)
No red flag signs or symptoms and overall reassuring exam though does have a recurrence of his mid back pain and may have a new compression fracture.  He does have upcoming appoint with spine specialist in a couple of weeks.  We did discuss imaging however given his reassuring neurologic exam do not think it would significantly alter our management at this point.  We discussed pain control.  Will give small supply of tramadol.  He has previously done well with this in the past.  He is probably having some component of muscle spasm as well and we will start baclofen.  Hopefully this will be less sedating for him than the Zanaflex has been in the past.  We discussed reasons to return to care and seek emergent care.  Will follow-up with his back specialist in a couple weeks as previously planned.

## 2022-07-24 NOTE — Progress Notes (Signed)
David Armstrong is a 72 y.o. male who presents today for an office visit.  Assessment/Plan:  New/Acute Problems: Sinusitis  No red flags.  Given length of symptoms would be reasonable to start antibiotics.  Will start course of Augmentin.  He can use over-the-counter Flonase as well.  Encouraged hydration.  He will let us know if symptoms are not improving in the next several days.  We discussed reasons to return to care.  Chronic Problems Addressed Today: Compression fracture of body of thoracic vertebra (HCC) No red flag signs or symptoms and overall reassuring exam though does have a recurrence of his mid back pain and may have a new compression fracture.  He does have upcoming appoint with spine specialist in a couple of weeks.  We did discuss imaging however given his reassuring neurologic exam do not think it would significantly alter our management at this point.  We discussed pain control.  Will give small supply of tramadol.  He has previously done well with this in the past.  He is probably having some component of muscle spasm as well and we will start baclofen.  Hopefully this will be less sedating for him than the Zanaflex has been in the past.  We discussed reasons to return to care and seek emergent care.  Will follow-up with his back specialist in a couple weeks as previously planned.     Subjective:  HPI:  See a/p for status of chronic conditions.  Main concern today is back pain. He does have a significant history of compression fractures in his thoracic and lumbar spine and has been following with neurosurgery for this. Most recent MRI from 2 months ago showed compression fractures at T11 and T12 as well as L1, L2, L3, and L5. Over the last couple of weeks has started getting more pain in his mid back. Feels very tight. Some numbness in his back as well. Pain comes and goes. Nothing makes it better or worse that he is aware of. No obvious injuries or precipitating events. He  will be seeing his back specialist in a couple of weeks to discuss radiofrequency ablation. He does think pain was doing reasonably well until a few weeks ago. No bowel or bladder incontinence. No urinary retention. Tried taking some tramadol with some improvement in symptoms.  Was given muscle relaxers in the past and has tried Zanaflex however this was too sedating and he has not tried this recently.  He has also been having a lot of congestion and post nasal drip for the last couple of weeks and going on about a month. No fevers or chills. No treatments tried.        Objective:  Physical Exam: BP 100/66   Pulse 77   Temp 97.7 F (36.5 C) (Temporal)   Ht 5\' 9"  (1.753 m)   Wt 143 lb (64.9 kg)   SpO2 98%   BMI 21.12 kg/m   Gen: No acute distress, resting comfortably HEENT: TMs with clear effusion.  OP erythematous.  Nasal mucosa erythematous and boggy bilaterally. CV: Regular rate and rhythm with no murmurs appreciated Pulm: Normal work of breathing, clear to auscultation bilaterally with no crackles, wheezes, or rhonchi MSK: Tenderness to palpation along lower thoracic and upper lumbar spinous processes and paraspinal muscles.  Neurovascular intact distally.  Weakness or numbness distally. Neuro: Grossly normal, moves all extremities Psych: Normal affect and thought content  Time Spent: 40 minutes of total time was spent on the date of  the encounter performing the following actions: chart review prior to seeing the patient, obtaining history, performing a medically necessary exam, counseling on the treatment plan, placing orders, and documenting in our EHR.        Katina Degree. Jimmey Ralph, MD 07/24/2022 11:10 AM

## 2022-07-28 DIAGNOSIS — M5137 Other intervertebral disc degeneration, lumbosacral region: Secondary | ICD-10-CM | POA: Diagnosis not present

## 2022-07-28 DIAGNOSIS — M9905 Segmental and somatic dysfunction of pelvic region: Secondary | ICD-10-CM | POA: Diagnosis not present

## 2022-07-28 DIAGNOSIS — M9903 Segmental and somatic dysfunction of lumbar region: Secondary | ICD-10-CM | POA: Diagnosis not present

## 2022-07-28 DIAGNOSIS — M9904 Segmental and somatic dysfunction of sacral region: Secondary | ICD-10-CM | POA: Diagnosis not present

## 2022-07-29 DIAGNOSIS — M9904 Segmental and somatic dysfunction of sacral region: Secondary | ICD-10-CM | POA: Diagnosis not present

## 2022-07-29 DIAGNOSIS — M9903 Segmental and somatic dysfunction of lumbar region: Secondary | ICD-10-CM | POA: Diagnosis not present

## 2022-07-29 DIAGNOSIS — M5137 Other intervertebral disc degeneration, lumbosacral region: Secondary | ICD-10-CM | POA: Diagnosis not present

## 2022-07-29 DIAGNOSIS — M9905 Segmental and somatic dysfunction of pelvic region: Secondary | ICD-10-CM | POA: Diagnosis not present

## 2022-07-30 DIAGNOSIS — M546 Pain in thoracic spine: Secondary | ICD-10-CM | POA: Diagnosis not present

## 2022-07-30 DIAGNOSIS — G894 Chronic pain syndrome: Secondary | ICD-10-CM | POA: Diagnosis not present

## 2022-07-30 DIAGNOSIS — M47816 Spondylosis without myelopathy or radiculopathy, lumbar region: Secondary | ICD-10-CM | POA: Diagnosis not present

## 2022-07-30 DIAGNOSIS — G8929 Other chronic pain: Secondary | ICD-10-CM | POA: Diagnosis not present

## 2022-07-30 DIAGNOSIS — M545 Low back pain, unspecified: Secondary | ICD-10-CM | POA: Diagnosis not present

## 2022-07-31 DIAGNOSIS — M9904 Segmental and somatic dysfunction of sacral region: Secondary | ICD-10-CM | POA: Diagnosis not present

## 2022-07-31 DIAGNOSIS — M9905 Segmental and somatic dysfunction of pelvic region: Secondary | ICD-10-CM | POA: Diagnosis not present

## 2022-07-31 DIAGNOSIS — M5137 Other intervertebral disc degeneration, lumbosacral region: Secondary | ICD-10-CM | POA: Diagnosis not present

## 2022-07-31 DIAGNOSIS — M9903 Segmental and somatic dysfunction of lumbar region: Secondary | ICD-10-CM | POA: Diagnosis not present

## 2022-08-05 DIAGNOSIS — M5137 Other intervertebral disc degeneration, lumbosacral region: Secondary | ICD-10-CM | POA: Diagnosis not present

## 2022-08-05 DIAGNOSIS — M9904 Segmental and somatic dysfunction of sacral region: Secondary | ICD-10-CM | POA: Diagnosis not present

## 2022-08-05 DIAGNOSIS — M9903 Segmental and somatic dysfunction of lumbar region: Secondary | ICD-10-CM | POA: Diagnosis not present

## 2022-08-05 DIAGNOSIS — M9905 Segmental and somatic dysfunction of pelvic region: Secondary | ICD-10-CM | POA: Diagnosis not present

## 2022-08-06 DIAGNOSIS — M9904 Segmental and somatic dysfunction of sacral region: Secondary | ICD-10-CM | POA: Diagnosis not present

## 2022-08-06 DIAGNOSIS — M9903 Segmental and somatic dysfunction of lumbar region: Secondary | ICD-10-CM | POA: Diagnosis not present

## 2022-08-06 DIAGNOSIS — M9905 Segmental and somatic dysfunction of pelvic region: Secondary | ICD-10-CM | POA: Diagnosis not present

## 2022-08-06 DIAGNOSIS — M5137 Other intervertebral disc degeneration, lumbosacral region: Secondary | ICD-10-CM | POA: Diagnosis not present

## 2022-08-11 DIAGNOSIS — M5137 Other intervertebral disc degeneration, lumbosacral region: Secondary | ICD-10-CM | POA: Diagnosis not present

## 2022-08-11 DIAGNOSIS — M9904 Segmental and somatic dysfunction of sacral region: Secondary | ICD-10-CM | POA: Diagnosis not present

## 2022-08-11 DIAGNOSIS — M9903 Segmental and somatic dysfunction of lumbar region: Secondary | ICD-10-CM | POA: Diagnosis not present

## 2022-08-11 DIAGNOSIS — M9905 Segmental and somatic dysfunction of pelvic region: Secondary | ICD-10-CM | POA: Diagnosis not present

## 2022-08-12 DIAGNOSIS — L218 Other seborrheic dermatitis: Secondary | ICD-10-CM | POA: Diagnosis not present

## 2022-08-12 DIAGNOSIS — L570 Actinic keratosis: Secondary | ICD-10-CM | POA: Diagnosis not present

## 2022-08-12 DIAGNOSIS — L280 Lichen simplex chronicus: Secondary | ICD-10-CM | POA: Diagnosis not present

## 2022-08-14 DIAGNOSIS — H01019 Ulcerative blepharitis unspecified eye, unspecified eyelid: Secondary | ICD-10-CM | POA: Diagnosis not present

## 2022-08-22 DIAGNOSIS — N201 Calculus of ureter: Secondary | ICD-10-CM | POA: Diagnosis not present

## 2022-08-22 DIAGNOSIS — N202 Calculus of kidney with calculus of ureter: Secondary | ICD-10-CM | POA: Diagnosis not present

## 2022-08-25 ENCOUNTER — Other Ambulatory Visit: Payer: Self-pay | Admitting: Urology

## 2022-08-25 DIAGNOSIS — M9905 Segmental and somatic dysfunction of pelvic region: Secondary | ICD-10-CM | POA: Diagnosis not present

## 2022-08-25 DIAGNOSIS — M9903 Segmental and somatic dysfunction of lumbar region: Secondary | ICD-10-CM | POA: Diagnosis not present

## 2022-08-25 DIAGNOSIS — M5137 Other intervertebral disc degeneration, lumbosacral region: Secondary | ICD-10-CM | POA: Diagnosis not present

## 2022-08-25 DIAGNOSIS — M9904 Segmental and somatic dysfunction of sacral region: Secondary | ICD-10-CM | POA: Diagnosis not present

## 2022-08-27 DIAGNOSIS — M9904 Segmental and somatic dysfunction of sacral region: Secondary | ICD-10-CM | POA: Diagnosis not present

## 2022-08-27 DIAGNOSIS — M9903 Segmental and somatic dysfunction of lumbar region: Secondary | ICD-10-CM | POA: Diagnosis not present

## 2022-08-27 DIAGNOSIS — M9905 Segmental and somatic dysfunction of pelvic region: Secondary | ICD-10-CM | POA: Diagnosis not present

## 2022-08-27 DIAGNOSIS — M5137 Other intervertebral disc degeneration, lumbosacral region: Secondary | ICD-10-CM | POA: Diagnosis not present

## 2022-08-28 ENCOUNTER — Other Ambulatory Visit: Payer: Self-pay | Admitting: Specialist

## 2022-08-28 ENCOUNTER — Ambulatory Visit
Admission: RE | Admit: 2022-08-28 | Discharge: 2022-08-28 | Disposition: A | Discharge status: Home / Self Care | Payer: Medicare Other | Source: Ambulatory Visit | Attending: Specialist | Admitting: Specialist

## 2022-08-28 DIAGNOSIS — M549 Dorsalgia, unspecified: Secondary | ICD-10-CM

## 2022-08-28 DIAGNOSIS — Z79899 Other long term (current) drug therapy: Secondary | ICD-10-CM | POA: Diagnosis not present

## 2022-08-28 DIAGNOSIS — M5417 Radiculopathy, lumbosacral region: Secondary | ICD-10-CM

## 2022-08-28 DIAGNOSIS — M546 Pain in thoracic spine: Secondary | ICD-10-CM

## 2022-08-28 DIAGNOSIS — M545 Low back pain, unspecified: Secondary | ICD-10-CM | POA: Diagnosis not present

## 2022-08-28 DIAGNOSIS — M7918 Myalgia, other site: Secondary | ICD-10-CM | POA: Diagnosis not present

## 2022-08-28 DIAGNOSIS — M7912 Myalgia of auxiliary muscles, head and neck: Secondary | ICD-10-CM | POA: Diagnosis not present

## 2022-08-28 DIAGNOSIS — G894 Chronic pain syndrome: Secondary | ICD-10-CM | POA: Diagnosis not present

## 2022-09-01 DIAGNOSIS — M9904 Segmental and somatic dysfunction of sacral region: Secondary | ICD-10-CM | POA: Diagnosis not present

## 2022-09-01 DIAGNOSIS — M5137 Other intervertebral disc degeneration, lumbosacral region: Secondary | ICD-10-CM | POA: Diagnosis not present

## 2022-09-01 DIAGNOSIS — M9903 Segmental and somatic dysfunction of lumbar region: Secondary | ICD-10-CM | POA: Diagnosis not present

## 2022-09-01 DIAGNOSIS — M9905 Segmental and somatic dysfunction of pelvic region: Secondary | ICD-10-CM | POA: Diagnosis not present

## 2022-09-01 NOTE — Progress Notes (Signed)
Talked with patient. Instructions given. Arrival time 44. NPO after MN. Stopped Xarelto on Mon. 09/01/22 at 0715. Hx and meds reviewed. Thayer Ohm will be the driver. Informed that he would need someone to stay overnight with him. To bring in blue folder

## 2022-09-03 DIAGNOSIS — M9903 Segmental and somatic dysfunction of lumbar region: Secondary | ICD-10-CM | POA: Diagnosis not present

## 2022-09-03 DIAGNOSIS — M5137 Other intervertebral disc degeneration, lumbosacral region: Secondary | ICD-10-CM | POA: Diagnosis not present

## 2022-09-03 DIAGNOSIS — M9905 Segmental and somatic dysfunction of pelvic region: Secondary | ICD-10-CM | POA: Diagnosis not present

## 2022-09-03 DIAGNOSIS — M9904 Segmental and somatic dysfunction of sacral region: Secondary | ICD-10-CM | POA: Diagnosis not present

## 2022-09-04 ENCOUNTER — Encounter (HOSPITAL_BASED_OUTPATIENT_CLINIC_OR_DEPARTMENT_OTHER): Payer: Self-pay | Admitting: Anesthesiology

## 2022-09-04 DIAGNOSIS — M5137 Other intervertebral disc degeneration, lumbosacral region: Secondary | ICD-10-CM | POA: Diagnosis not present

## 2022-09-04 DIAGNOSIS — M9904 Segmental and somatic dysfunction of sacral region: Secondary | ICD-10-CM | POA: Diagnosis not present

## 2022-09-04 DIAGNOSIS — M9903 Segmental and somatic dysfunction of lumbar region: Secondary | ICD-10-CM | POA: Diagnosis not present

## 2022-09-04 DIAGNOSIS — M9905 Segmental and somatic dysfunction of pelvic region: Secondary | ICD-10-CM | POA: Diagnosis not present

## 2022-09-05 ENCOUNTER — Encounter (HOSPITAL_BASED_OUTPATIENT_CLINIC_OR_DEPARTMENT_OTHER): Payer: Self-pay | Admitting: Urology

## 2022-09-05 ENCOUNTER — Ambulatory Visit (HOSPITAL_BASED_OUTPATIENT_CLINIC_OR_DEPARTMENT_OTHER)
Admission: RE | Admit: 2022-09-05 | Discharge: 2022-09-05 | Disposition: A | Discharge status: Home / Self Care | Payer: Medicare Other | Source: Ambulatory Visit | Attending: Urology | Admitting: Urology

## 2022-09-05 ENCOUNTER — Ambulatory Visit (HOSPITAL_COMMUNITY): Payer: Medicare Other

## 2022-09-05 ENCOUNTER — Other Ambulatory Visit: Payer: Self-pay

## 2022-09-05 ENCOUNTER — Encounter (HOSPITAL_BASED_OUTPATIENT_CLINIC_OR_DEPARTMENT_OTHER)
Admission: RE | Disposition: A | Discharge status: Home / Self Care | Payer: Self-pay | Source: Ambulatory Visit | Attending: Urology

## 2022-09-05 DIAGNOSIS — N200 Calculus of kidney: Secondary | ICD-10-CM | POA: Diagnosis not present

## 2022-09-05 DIAGNOSIS — K449 Diaphragmatic hernia without obstruction or gangrene: Secondary | ICD-10-CM | POA: Diagnosis not present

## 2022-09-05 DIAGNOSIS — N201 Calculus of ureter: Secondary | ICD-10-CM | POA: Insufficient documentation

## 2022-09-05 HISTORY — PX: EXTRACORPOREAL SHOCK WAVE LITHOTRIPSY: SHX1557

## 2022-09-05 SURGERY — LITHOTRIPSY, ESWL
Anesthesia: LOCAL | Laterality: Left

## 2022-09-05 MED ORDER — CIPROFLOXACIN HCL 500 MG PO TABS
500.0000 mg | ORAL_TABLET | ORAL | Status: AC
  Administered 2022-09-05: 500 mg via ORAL

## 2022-09-05 MED ORDER — DIAZEPAM 5 MG PO TABS
ORAL_TABLET | ORAL | Status: AC
  Filled 2022-09-05: qty 2

## 2022-09-05 MED ORDER — DIPHENHYDRAMINE HCL 25 MG PO CAPS
ORAL_CAPSULE | ORAL | Status: AC
  Filled 2022-09-05: qty 1

## 2022-09-05 MED ORDER — CIPROFLOXACIN HCL 500 MG PO TABS
ORAL_TABLET | ORAL | Status: AC
  Filled 2022-09-05: qty 1

## 2022-09-05 MED ORDER — OXYCODONE-ACETAMINOPHEN 5-325 MG PO TABS: 1.0000 | ORAL_TABLET | Freq: Four times a day (QID) | ORAL | 0 refills | Status: DC | PRN

## 2022-09-05 MED ORDER — DIAZEPAM 5 MG PO TABS
10.0000 mg | ORAL_TABLET | ORAL | Status: AC
  Administered 2022-09-05: 10 mg via ORAL

## 2022-09-05 MED ORDER — DIPHENHYDRAMINE HCL 25 MG PO CAPS
25.0000 mg | ORAL_CAPSULE | ORAL | Status: AC
  Administered 2022-09-05: 25 mg via ORAL

## 2022-09-05 MED ORDER — SODIUM CHLORIDE 0.9 % IV SOLN: INTRAVENOUS | Status: DC

## 2022-09-05 MED ORDER — DOCUSATE SODIUM 100 MG PO CAPS: 100.0000 mg | ORAL_CAPSULE | Freq: Every day | ORAL | 0 refills | Status: DC | PRN

## 2022-09-05 NOTE — Op Note (Signed)
ESWL Operative Note  Treating Physician: Seara Hinesley, MD  Pre-op diagnosis: Left ureteral stone  Post-op diagnosis: Same   Procedure: Left  ESWL  See [facility] OP note scanned into chart. Also because of the size, density, location and other factors that cannot be anticipated I feel this will likely be a staged procedure. This fact supersedes any indication in the scanned [facility] operative note to the contrary.  Matt R. Eran Mistry MD [facility]  Pager: [phone]   

## 2022-09-05 NOTE — H&P (Signed)
Urology Preoperative H&P   Chief Complaint: Left proximal ureteral stone  History of Present Illness: David Armstrong is a 72 y.o. male with a left proximal ureteral stone here for left ESWL. Denies fevers, chills, dysuria.     Past Medical History:  Diagnosis Date   Anxiety    Arthritis    "lower back; hips;' right knee"    Bipolar disorder (HCC)    Chronic constipation    Depression    Diverticulosis of colon    Edema of both lower extremities    ANKLES   Fatty liver    GERD (gastroesophageal reflux disease)    Hiatal hernia    History of anal fissures    History of deep vein thrombosis (DVT) of lower extremity 02/11/2016   right lower extremity distal and proximal extensive acute   History of Helicobacter pylori infection 2012; 2009   History of kidney stones    History of pneumonia 01/2015   CAP   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    Hyperlipidemia    IBS (irritable bowel syndrome)    Left ureteral stone    Nocturia    Peripheral neuropathy    per pt due to back DDD   Personal history of colonic polyps    HYPERPLASTIC POLYP   Pulmonary nodule, right    incidental finding CT chest angio 02-11-2016 stable   TMJ (temporomandibular joint syndrome)    Varicosities of leg     Past Surgical History:  Procedure Laterality Date   BELPHAROPTOSIS REPAIR Bilateral 01/2015   CATARACT EXTRACTION W/ INTRAOCULAR LENS  IMPLANT, BILATERAL  04/2016   COLONOSCOPY WITH ESOPHAGOGASTRODUODENOSCOPY (EGD)  last one 03-02-2013   CYSTOSCOPY/RETROGRADE/URETEROSCOPY/STONE EXTRACTION WITH BASKET  2010   CYSTOSCOPY/URETEROSCOPY/HOLMIUM LASER/STENT PLACEMENT Left 03/16/2017   Procedure: CYSTOSCOPY/RETROGRADE/URETEROSCOPY/HOLMIUM LASER/STENT PLACEMENT, STONE BASKET EXTRACTION AND STENT PLACEMENT;  Surgeon: Ihor Gully, MD;  Location: [facility];  Service: Urology;  Laterality: Left;   EXCISIONAL HEMORRHOIDECTOMY  2015 approx.   EXTRACORPOREAL  SHOCK WAVE LITHOTRIPSY  1987; 1992   FOOT NEUROMA SURGERY Bilateral left 09/2015; right 04/ 2017   morton' neuroma   FOOT NEUROMA SURGERY Right 2004   morton's neuroma and removal heel spur   HAMMER TOE SURGERY Left 2013   5th toe   INCISION AND DRAINAGE FOOT Left 1992   "ball of foot; stepped on piece of hard wire & it went thru my shoe"   IR ANGIOGRAM PULMONARY BILATERAL SELECTIVE  12/24/2021   IR ANGIOGRAM SELECTIVE EACH ADDITIONAL VESSEL  12/24/2021   IR ANGIOGRAM SELECTIVE EACH ADDITIONAL VESSEL  12/24/2021   IR INFUSION THROMBOL ARTERIAL INITIAL (MS)  12/24/2021   IR INFUSION THROMBOL ARTERIAL INITIAL (MS)  12/24/2021   IR THROMB F/U EVAL ART/VEN FINAL DAY (MS)  12/25/2021   IR US GUIDE VASC ACCESS RIGHT  12/24/2021   KNEE ARTHROSCOPY Bilateral left x2 1997;  right 2004 & 2005   NASAL FRACTURE SURGERY  1997   PATELLA-FEMORAL ARTHROPLASTY Right 07-06-2003   dr Renae Fickle [facility]   PLANTAR FASCIA RELEASE Bilateral 1987 & 1993   RHINOPLASTY  2000   SHOULDER ARTHROSCOPY Left 2012;  2013   SHOULDER ARTHROSCOPY WITH DISTAL CLAVICLE RESECTION Right 07-31-2005   dr Eulah Pont  [facility]   w/ Debridement labral and adhesions, acromioplasty, bursectomy, and CA ligament release   TRANSTHORACIC ECHOCARDIOGRAM  02/12/2016   ef 65-70%, grade 1 diastolic dysfunction/  trivial TR    Allergies:  Allergies  Allergen Reactions  Effexor Xr [Venlafaxine Hcl Er] Other (See Comments)    Insomnia   Acyclovir And Related    Zocor [Simvastatin] Other (See Comments)    Muscle ache/ pain   Dexilant [Dexlansoprazole] Other (See Comments)    Muscle aches   Ivp Dye [Iodinated Contrast Media] Rash   Soma [Carisoprodol] Other (See Comments)    Sores on arm   Sulfa Antibiotics Rash    Family History  Problem Relation Age of Onset   Heart disease Mother    Stroke Mother    Cirrhosis Father    Heart disease Father    Skin cancer Father    Prostate cancer Maternal Grandfather    Parkinson's disease Paternal Grandfather     Heart disease Brother    Colon cancer Neg Hx    Stomach cancer Neg Hx     Social History:  reports that he has never smoked. He has never used smokeless tobacco. He reports that he does not drink alcohol and does not use drugs.  ROS: A complete review of systems was performed.  All systems are negative except for pertinent findings as noted.  Physical Exam:  Vital signs in last 24 hours: Temp:  [98.1 F (36.7 C)] 98.1 F (36.7 C) (05/24 0931) Pulse Rate:  [70] 70 (05/24 0931) Resp:  [18] 18 (05/24 0931) BP: (120)/(79) 120/79 (05/24 0931) SpO2:  [100 %] 100 % (05/24 0931) Weight:  [63.8 kg] 63.8 kg (05/24 0931) Constitutional:  Alert and oriented, No acute distress Cardiovascular: Regular rate and rhythm Respiratory: Normal respiratory effort, Lungs clear bilaterally GI: Abdomen is soft, nontender, nondistended, no abdominal masses GU: No CVA tenderness Lymphatic: No lymphadenopathy Neurologic: Grossly intact, no focal deficits Psychiatric: Normal mood and affect  Laboratory Data:  No results for input(s): "WBC", "HGB", "HCT", "PLT" in the last 72 hours.  No results for input(s): "NA", "K", "CL", "GLUCOSE", "BUN", "CALCIUM", "CREATININE" in the last 72 hours.  Invalid input(s): "CO3"   No results found for this or any previous visit (from the past 24 hour(s)). No results found for this or any previous visit (from the past 240 hour(s)).  Renal Function: No results for input(s): "CREATININE" in the last 168 hours. CrCl cannot be calculated (Patient's most recent lab result is older than the maximum 21 days allowed.).  Radiologic Imaging: No results found.  I independently reviewed the above imaging studies.  Assessment and Plan David Armstrong is a 72 y.o. male with left proximal ureteral stone here for left ESWL.  The risks, benefits and alternatives of left ESWL was discussed with the patient. I described the risks which include arrhythmia, kidney contusion,  kidney hemorrhage, need for transfusion, back discomfort, flank ecchymosis, flank abrasion, inability to fracture the stone, inability to pass stone fragments, Steinstrasse, infection associated with obstructing stones, need for an alternative surgical procedure and possible need for repeat shockwave lithotripsy.  The patient voices understanding and wishes to proceed.     Matt R. Divon Krabill MD 09/05/2022, 10:23 AM  [facility] Pager: [phone]): [phone]

## 2022-09-05 NOTE — Discharge Instructions (Signed)

## 2022-09-09 ENCOUNTER — Encounter (HOSPITAL_BASED_OUTPATIENT_CLINIC_OR_DEPARTMENT_OTHER): Payer: Self-pay | Admitting: Urology

## 2022-09-11 DIAGNOSIS — M9905 Segmental and somatic dysfunction of pelvic region: Secondary | ICD-10-CM | POA: Diagnosis not present

## 2022-09-11 DIAGNOSIS — M9904 Segmental and somatic dysfunction of sacral region: Secondary | ICD-10-CM | POA: Diagnosis not present

## 2022-09-11 DIAGNOSIS — M9903 Segmental and somatic dysfunction of lumbar region: Secondary | ICD-10-CM | POA: Diagnosis not present

## 2022-09-11 DIAGNOSIS — M5137 Other intervertebral disc degeneration, lumbosacral region: Secondary | ICD-10-CM | POA: Diagnosis not present

## 2022-09-15 ENCOUNTER — Telehealth: Payer: Self-pay | Admitting: Family Medicine

## 2022-09-15 ENCOUNTER — Other Ambulatory Visit: Payer: Self-pay | Admitting: Urology

## 2022-09-15 DIAGNOSIS — N200 Calculus of kidney: Secondary | ICD-10-CM | POA: Diagnosis not present

## 2022-09-15 NOTE — Telephone Encounter (Signed)
David Armstrong with [facility] requests to be called re: Requests holding Xarelto 3 days prior to Lithotripsy on 09/22/22

## 2022-09-15 NOTE — Telephone Encounter (Signed)
Please advise 

## 2022-09-16 DIAGNOSIS — M9904 Segmental and somatic dysfunction of sacral region: Secondary | ICD-10-CM | POA: Diagnosis not present

## 2022-09-16 DIAGNOSIS — M9903 Segmental and somatic dysfunction of lumbar region: Secondary | ICD-10-CM | POA: Diagnosis not present

## 2022-09-16 DIAGNOSIS — M9905 Segmental and somatic dysfunction of pelvic region: Secondary | ICD-10-CM | POA: Diagnosis not present

## 2022-09-16 DIAGNOSIS — M5137 Other intervertebral disc degeneration, lumbosacral region: Secondary | ICD-10-CM | POA: Diagnosis not present

## 2022-09-16 NOTE — Telephone Encounter (Signed)
Ok for him to hold anticoagulation for 3 days.  Katina Degree. Jimmey Ralph, MD 09/16/2022 9:42 AM

## 2022-09-16 NOTE — Telephone Encounter (Signed)
Called Pam with [facility] at [phone]  Notified ok to Hold Xarelto 3 days prior to procedure

## 2022-09-18 DIAGNOSIS — M9904 Segmental and somatic dysfunction of sacral region: Secondary | ICD-10-CM | POA: Diagnosis not present

## 2022-09-18 DIAGNOSIS — M9905 Segmental and somatic dysfunction of pelvic region: Secondary | ICD-10-CM | POA: Diagnosis not present

## 2022-09-18 DIAGNOSIS — M9903 Segmental and somatic dysfunction of lumbar region: Secondary | ICD-10-CM | POA: Diagnosis not present

## 2022-09-18 DIAGNOSIS — M5137 Other intervertebral disc degeneration, lumbosacral region: Secondary | ICD-10-CM | POA: Diagnosis not present

## 2022-09-19 NOTE — Progress Notes (Signed)
Preop phone call completed. Aware NPO after midnight. May have clear liquids until 4 AM. Stopped Xarelto 66/2024 At 0900. Driver Mechele Collin.

## 2022-09-21 NOTE — H&P (Signed)
H&P  Chief Complaint: Rt ureteral stone  History of Present Illness: 72 yo male w/ long h/o calculous dz presents for ESL of a non-progressing Rt ureteral stone.  Past Medical History:  Diagnosis Date   Anxiety    Arthritis    "lower back; hips;' right knee"    Bipolar disorder (HCC)    Chronic constipation    Depression    Diverticulosis of colon    Edema of both lower extremities    ANKLES   Fatty liver    GERD (gastroesophageal reflux disease)    Hiatal hernia    History of anal fissures    History of deep vein thrombosis (DVT) of lower extremity 02/11/2016   right lower extremity distal and proximal extensive acute   History of Helicobacter pylori infection 2012; 2009   History of kidney stones    History of pneumonia 01/2015   CAP   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    Hyperlipidemia    IBS (irritable bowel syndrome)    Left ureteral stone    Nocturia    Peripheral neuropathy    per pt due to back DDD   Personal history of colonic polyps    HYPERPLASTIC POLYP   Pulmonary nodule, right    incidental finding CT chest angio 02-11-2016 stable   TMJ (temporomandibular joint syndrome)    Varicosities of leg     Past Surgical History:  Procedure Laterality Date   BELPHAROPTOSIS REPAIR Bilateral 01/2015   CATARACT EXTRACTION W/ INTRAOCULAR LENS  IMPLANT, BILATERAL  04/2016   COLONOSCOPY WITH ESOPHAGOGASTRODUODENOSCOPY (EGD)  last one 03-02-2013   CYSTOSCOPY/RETROGRADE/URETEROSCOPY/STONE EXTRACTION WITH BASKET  2010   CYSTOSCOPY/URETEROSCOPY/HOLMIUM LASER/STENT PLACEMENT Left 03/16/2017   Procedure: CYSTOSCOPY/RETROGRADE/URETEROSCOPY/HOLMIUM LASER/STENT PLACEMENT, STONE BASKET EXTRACTION AND STENT PLACEMENT;  Surgeon: Ihor Gully, MD;  Location: [facility];  Service: Urology;  Laterality: Left;   EXCISIONAL HEMORRHOIDECTOMY  2015 approx.   EXTRACORPOREAL SHOCK WAVE LITHOTRIPSY  1987; 1992   EXTRACORPOREAL SHOCK WAVE  LITHOTRIPSY Left 09/05/2022   Procedure: LEFT EXTRACORPOREAL SHOCK WAVE LITHOTRIPSY (ESWL);  Surgeon: Jannifer Hick, MD;  Location: [facility];  Service: Urology;  Laterality: Left;   FOOT NEUROMA SURGERY Bilateral left 09/2015; right 04/ 2017   morton' neuroma   FOOT NEUROMA SURGERY Right 2004   morton's neuroma and removal heel spur   HAMMER TOE SURGERY Left 2013   5th toe   INCISION AND DRAINAGE FOOT Left 1992   "ball of foot; stepped on piece of hard wire & it went thru my shoe"   IR ANGIOGRAM PULMONARY BILATERAL SELECTIVE  12/24/2021   IR ANGIOGRAM SELECTIVE EACH ADDITIONAL VESSEL  12/24/2021   IR ANGIOGRAM SELECTIVE EACH ADDITIONAL VESSEL  12/24/2021   IR INFUSION THROMBOL ARTERIAL INITIAL (MS)  12/24/2021   IR INFUSION THROMBOL ARTERIAL INITIAL (MS)  12/24/2021   IR THROMB F/U EVAL ART/VEN FINAL DAY (MS)  12/25/2021   IR US GUIDE VASC ACCESS RIGHT  12/24/2021   KNEE ARTHROSCOPY Bilateral left x2 1997;  right 2004 & 2005   NASAL FRACTURE SURGERY  1997   PATELLA-FEMORAL ARTHROPLASTY Right 07-06-2003   dr Renae Fickle [facility]   PLANTAR FASCIA RELEASE Bilateral 1987 & 1993   RHINOPLASTY  2000   SHOULDER ARTHROSCOPY Left 2012;  2013   SHOULDER ARTHROSCOPY WITH DISTAL CLAVICLE RESECTION Right 07-31-2005   dr Eulah Pont  [facility]   w/ Debridement labral and adhesions, acromioplasty, bursectomy, and CA ligament release   TRANSTHORACIC ECHOCARDIOGRAM  02/12/2016   ef 65-70%, grade 1 diastolic dysfunction/  trivial TR    Home Medications:  Allergies as of 09/21/2022       Reactions   Effexor Xr [venlafaxine Hcl Er] Other (See Comments)   Insomnia   Acyclovir And Related    Zocor [simvastatin] Other (See Comments)   Muscle ache/ pain   Dexilant [dexlansoprazole] Other (See Comments)   Muscle aches   Ivp Dye [iodinated Contrast Media] Rash   Soma [carisoprodol] Other (See Comments)   Sores on arm   Sulfa Antibiotics Rash        Medication List      Notice   Cannot display discharge  medications because the patient has not yet been admitted.     Allergies:  Allergies  Allergen Reactions   Effexor Xr [Venlafaxine Hcl Er] Other (See Comments)    Insomnia   Acyclovir And Related    Zocor [Simvastatin] Other (See Comments)    Muscle ache/ pain   Dexilant [Dexlansoprazole] Other (See Comments)    Muscle aches   Ivp Dye [Iodinated Contrast Media] Rash   Soma [Carisoprodol] Other (See Comments)    Sores on arm   Sulfa Antibiotics Rash    Family History  Problem Relation Age of Onset   Heart disease Mother    Stroke Mother    Cirrhosis Father    Heart disease Father    Skin cancer Father    Prostate cancer Maternal Grandfather    Parkinson's disease Paternal Grandfather    Heart disease Brother    Colon cancer Neg Hx    Stomach cancer Neg Hx     Social History:  reports that he has never smoked. He has never used smokeless tobacco. He reports that he does not drink alcohol and does not use drugs.  ROS: A complete review of systems was performed.  All systems are negative except for pertinent findings as noted.  Physical Exam:  Vital signs in last 24 hours: Ht 5\' 11"  (1.803 m)   Wt 65.8 kg   BMI 20.22 kg/m  Constitutional:  Alert and oriented, No acute distress Cardiovascular: Regular rate  Respiratory: Normal respiratory effort Neurologic: Grossly intact, no focal deficits Psychiatric: Normal mood and affect  I have reviewed prior pt notes  I have reviewed urinalysis results  I have independently reviewed prior imaging   Impression/Assessment:  Rt upper ureteral stone  Plan:  ESL

## 2022-09-22 ENCOUNTER — Encounter (HOSPITAL_BASED_OUTPATIENT_CLINIC_OR_DEPARTMENT_OTHER): Payer: Self-pay | Admitting: Urology

## 2022-09-22 ENCOUNTER — Ambulatory Visit (HOSPITAL_COMMUNITY): Payer: Medicare Other

## 2022-09-22 ENCOUNTER — Encounter (HOSPITAL_BASED_OUTPATIENT_CLINIC_OR_DEPARTMENT_OTHER)
Admission: RE | Disposition: A | Discharge status: Home / Self Care | Payer: Self-pay | Source: Ambulatory Visit | Attending: Urology

## 2022-09-22 ENCOUNTER — Ambulatory Visit (HOSPITAL_BASED_OUTPATIENT_CLINIC_OR_DEPARTMENT_OTHER)
Admission: RE | Admit: 2022-09-22 | Discharge: 2022-09-22 | Disposition: A | Discharge status: Home / Self Care | Payer: Medicare Other | Source: Ambulatory Visit | Attending: Urology | Admitting: Urology

## 2022-09-22 ENCOUNTER — Other Ambulatory Visit: Payer: Self-pay

## 2022-09-22 DIAGNOSIS — Z86718 Personal history of other venous thrombosis and embolism: Secondary | ICD-10-CM | POA: Insufficient documentation

## 2022-09-22 DIAGNOSIS — N200 Calculus of kidney: Secondary | ICD-10-CM | POA: Diagnosis not present

## 2022-09-22 DIAGNOSIS — Z86711 Personal history of pulmonary embolism: Secondary | ICD-10-CM | POA: Diagnosis not present

## 2022-09-22 DIAGNOSIS — N201 Calculus of ureter: Secondary | ICD-10-CM | POA: Diagnosis not present

## 2022-09-22 DIAGNOSIS — Z91041 Radiographic dye allergy status: Secondary | ICD-10-CM | POA: Insufficient documentation

## 2022-09-22 DIAGNOSIS — K760 Fatty (change of) liver, not elsewhere classified: Secondary | ICD-10-CM | POA: Diagnosis not present

## 2022-09-22 DIAGNOSIS — F319 Bipolar disorder, unspecified: Secondary | ICD-10-CM | POA: Diagnosis not present

## 2022-09-22 DIAGNOSIS — K219 Gastro-esophageal reflux disease without esophagitis: Secondary | ICD-10-CM | POA: Diagnosis not present

## 2022-09-22 DIAGNOSIS — E785 Hyperlipidemia, unspecified: Secondary | ICD-10-CM | POA: Diagnosis not present

## 2022-09-22 HISTORY — PX: EXTRACORPOREAL SHOCK WAVE LITHOTRIPSY: SHX1557

## 2022-09-22 SURGERY — LITHOTRIPSY, ESWL
Anesthesia: LOCAL | Laterality: Right

## 2022-09-22 MED ORDER — DIAZEPAM 5 MG PO TABS
5.0000 mg | ORAL_TABLET | Freq: Once | ORAL | Status: AC
  Administered 2022-09-22: 5 mg via ORAL

## 2022-09-22 MED ORDER — CIPROFLOXACIN HCL 500 MG PO TABS
500.0000 mg | ORAL_TABLET | ORAL | Status: AC
  Administered 2022-09-22: 500 mg via ORAL

## 2022-09-22 MED ORDER — DIAZEPAM 5 MG PO TABS
ORAL_TABLET | ORAL | Status: AC
  Filled 2022-09-22: qty 10

## 2022-09-22 MED ORDER — DIPHENHYDRAMINE HCL 25 MG PO CAPS
ORAL_CAPSULE | ORAL | Status: AC
  Filled 2022-09-22: qty 1

## 2022-09-22 MED ORDER — DIPHENHYDRAMINE HCL 25 MG PO CAPS
25.0000 mg | ORAL_CAPSULE | ORAL | Status: AC
  Administered 2022-09-22: 25 mg via ORAL

## 2022-09-22 MED ORDER — DIAZEPAM 5 MG PO TABS: 10.0000 mg | ORAL_TABLET | ORAL | Status: DC

## 2022-09-22 MED ORDER — SODIUM CHLORIDE 0.9 % IV SOLN: INTRAVENOUS | Status: DC

## 2022-09-22 MED ORDER — CIPROFLOXACIN HCL 500 MG PO TABS
ORAL_TABLET | ORAL | Status: AC
  Filled 2022-09-22: qty 1

## 2022-09-22 NOTE — Discharge Instructions (Signed)
See [facility] discharge instructions in chart.  

## 2022-09-22 NOTE — Interval H&P Note (Signed)
History and Physical Interval Note:  09/22/2022 10:03 AM  Dorina Hoyer  has presented today for surgery, with the diagnosis of RIGHT PROXIMAL STONE.  The various methods of treatment have been discussed with the patient and family. After consideration of risks, benefits and other options for treatment, the patient has consented to  Procedure(s): RIGHT EXTRACORPOREAL SHOCK WAVE LITHOTRIPSY (ESWL) (Right) as a surgical intervention.  The patient's history has been reviewed, patient examined, no change in status, stable for surgery.  I have reviewed the patient's chart and labs.  Questions were answered to the patient's satisfaction.     Bertram Millard Kasy Iannacone

## 2022-09-22 NOTE — Op Note (Signed)
See [facility] OP note scanned into chart. 

## 2022-09-23 ENCOUNTER — Encounter (HOSPITAL_BASED_OUTPATIENT_CLINIC_OR_DEPARTMENT_OTHER): Payer: Self-pay | Admitting: Urology

## 2022-10-02 ENCOUNTER — Ambulatory Visit (INDEPENDENT_AMBULATORY_CARE_PROVIDER_SITE_OTHER): Payer: Medicare Other | Admitting: Family Medicine

## 2022-10-02 ENCOUNTER — Telehealth: Payer: Self-pay | Admitting: Family Medicine

## 2022-10-02 ENCOUNTER — Encounter: Payer: Self-pay | Admitting: Family Medicine

## 2022-10-02 ENCOUNTER — Ambulatory Visit: Payer: Medicare Other | Admitting: Family Medicine

## 2022-10-02 VITALS — BP 114/77 | HR 72 | Temp 97.3°F | Ht 69.0 in | Wt 140.0 lb

## 2022-10-02 DIAGNOSIS — K589 Irritable bowel syndrome without diarrhea: Secondary | ICD-10-CM

## 2022-10-02 DIAGNOSIS — K219 Gastro-esophageal reflux disease without esophagitis: Secondary | ICD-10-CM

## 2022-10-02 DIAGNOSIS — L219 Seborrheic dermatitis, unspecified: Secondary | ICD-10-CM

## 2022-10-02 MED ORDER — DESONIDE 0.05 % EX CREA: TOPICAL_CREAM | Freq: Two times a day (BID) | CUTANEOUS | 0 refills | Status: DC

## 2022-10-02 MED ORDER — KETOCONAZOLE 2 % EX SHAM: 1.0000 | MEDICATED_SHAMPOO | Freq: Every day | CUTANEOUS | 0 refills | Status: DC

## 2022-10-02 NOTE — Telephone Encounter (Signed)
Patient states the wrong RX was sent to Pharmacy. Patient requests RX for Desonide Lotion (not cream) be sent to: [id number] - [place], [place] - [address] AT [facility] [address] Phone: [phone]  Fax: [phone]

## 2022-10-02 NOTE — Progress Notes (Signed)
   David Armstrong is a 72 y.o. male who presents today for an office visit.  Assessment/Plan:  New/Acute Problems: Abdominal pain No red flags.  Reassuring exam.  He did have a noncontrast abdominal CT scan a couple of months ago which showed hiatal hernia but no other acute intra-abdominal abnormalities.  He is having more dysphagia symptoms and may have some element of gastritis or PUD.  We did discuss repeating imaging however we will have him follow-up with GI first.  We discussed reasons to return to care and seek emergent care.  Chronic Problems Addressed Today: GERD He is having more dysphagia.  He is also having some central abdominal pain which may be part of his GERD or possible PUD.  We did discuss obtaining imaging at this point however he would like to see GI.  Will place referral today.  He will continue omeprazole 20 mg daily.  Seborrheic dermatitis Rash on superior scalp consistent with seborrheic dermatitis.  Will start topical ketoconazole.  He can also use topical desonide as needed this has worked well in the past.  He will let us know if not improving.     Subjective:  HPI:  See Assessment / plan for status of chronic conditions.   His main concern today is abdominal pain. This has been going on for a few months. Located in middle part of his abdomen. No nausea or vomiting. Some constipation. Worse with movement and raising up. No obvious injuries or precipitating events. Sometimes some reflux. Sometimes has some trouble with swallowing and getting things stuck in his throat.   He also has a rash on his scalp. This has been there for several weeks. Some flaking. Tried desonide with some improvement.        Objective:  Physical Exam: BP 114/77   Pulse 72   Temp (!) 97.3 F (36.3 C) (Temporal)   Ht 5\' 9"  (1.753 m)   Wt 140 lb (63.5 kg)   SpO2 99%   BMI 20.67 kg/m    Wt Readings from Last 3 Encounters:  10/02/22 140 lb (63.5 kg)  09/22/22 137 lb 11.2 oz  (62.5 kg)  09/05/22 140 lb 11.2 oz (63.8 kg)  Gen: No acute distress, resting comfortably CV: Regular rate and rhythm with no murmurs appreciated Pulm: Normal work of breathing, clear to auscultation bilaterally with no crackles, wheezes, or rhonchi Abdomen: Soft, NT, ND.  Skin: Flakey rash on superior scalp.  Neuro: Grossly normal, moves all extremities Psych: Normal affect and thought content  Time Spent: 40 minutes of total time was spent on the date of the encounter performing the following actions: chart review prior to seeing the patient, obtaining history, performing a medically necessary exam, counseling on the treatment plan, placing orders, and documenting in our EHR.        Katina Degree. Jimmey Ralph, MD 10/02/2022 11:05 AM

## 2022-10-02 NOTE — Assessment & Plan Note (Signed)
He is having more dysphagia.  He is also having some central abdominal pain which may be part of his GERD or possible PUD.  We did discuss obtaining imaging at this point however he would like to see GI.  Will place referral today.  He will continue omeprazole 20 mg daily.

## 2022-10-02 NOTE — Patient Instructions (Addendum)
It was very nice to see you today!  We will refer you to see gastroenterology.   Please start the ketoconazole.  I will refill your desonide.   No follow-ups on file.   Take care, Dr Jimmey Ralph  PLEASE NOTE:  If you had any lab tests, please let us know if you have not heard back within a few days. You may see your results on mychart before we have a chance to review them but we will give you a call once they are reviewed by [place].   If we ordered any referrals today, please let us know if you have not heard from their office within the next week.   If you had any urgent prescriptions sent in today, please check with the pharmacy within an hour of our visit to make sure the prescription was transmitted appropriately.   Please try these tips to maintain a healthy lifestyle:  Eat at least 3 REAL meals and 1-2 snacks per day.  Aim for no more than 5 hours between eating.  If you eat breakfast, please do so within one hour of getting up.   Each meal should contain half fruits/vegetables, one quarter protein, and one quarter carbs (no bigger than a computer mouse)  Cut down on sweet beverages. This includes juice, soda, and sweet tea.   Drink at least 1 glass of water with each meal and aim for at least 8 glasses per day  Exercise at least 150 minutes every week.

## 2022-10-02 NOTE — Assessment & Plan Note (Signed)
Rash on superior scalp consistent with seborrheic dermatitis.  Will start topical ketoconazole.  He can also use topical desonide as needed this has worked well in the past.  He will let us know if not improving.

## 2022-10-03 ENCOUNTER — Telehealth: Payer: Self-pay

## 2022-10-03 ENCOUNTER — Other Ambulatory Visit: Payer: Self-pay

## 2022-10-03 MED ORDER — DESONIDE 0.05 % EX LOTN: TOPICAL_LOTION | Freq: Two times a day (BID) | CUTANEOUS | 0 refills | Status: DC

## 2022-10-03 NOTE — Telephone Encounter (Signed)
Called patient to let him know the Desonide lotion 0.05% was sent to [facility] on [address]

## 2022-10-06 DIAGNOSIS — N202 Calculus of kidney with calculus of ureter: Secondary | ICD-10-CM | POA: Diagnosis not present

## 2022-10-08 DIAGNOSIS — M546 Pain in thoracic spine: Secondary | ICD-10-CM | POA: Diagnosis not present

## 2022-10-08 DIAGNOSIS — M542 Cervicalgia: Secondary | ICD-10-CM | POA: Diagnosis not present

## 2022-10-08 DIAGNOSIS — M47816 Spondylosis without myelopathy or radiculopathy, lumbar region: Secondary | ICD-10-CM | POA: Diagnosis not present

## 2022-10-08 DIAGNOSIS — G894 Chronic pain syndrome: Secondary | ICD-10-CM | POA: Diagnosis not present

## 2022-10-10 ENCOUNTER — Encounter: Payer: Self-pay | Admitting: Gastroenterology

## 2022-10-22 DIAGNOSIS — M47816 Spondylosis without myelopathy or radiculopathy, lumbar region: Secondary | ICD-10-CM | POA: Diagnosis not present

## 2022-10-22 DIAGNOSIS — M5412 Radiculopathy, cervical region: Secondary | ICD-10-CM | POA: Diagnosis not present

## 2022-10-22 DIAGNOSIS — G894 Chronic pain syndrome: Secondary | ICD-10-CM | POA: Diagnosis not present

## 2022-10-23 DIAGNOSIS — R311 Benign essential microscopic hematuria: Secondary | ICD-10-CM | POA: Diagnosis not present

## 2022-11-05 DIAGNOSIS — M47816 Spondylosis without myelopathy or radiculopathy, lumbar region: Secondary | ICD-10-CM | POA: Diagnosis not present

## 2022-11-10 ENCOUNTER — Other Ambulatory Visit: Payer: Self-pay | Admitting: Urology

## 2022-11-10 ENCOUNTER — Ambulatory Visit (INDEPENDENT_AMBULATORY_CARE_PROVIDER_SITE_OTHER): Payer: Medicare Other | Admitting: Family

## 2022-11-10 VITALS — BP 118/68 | HR 83 | Temp 97.8°F | Ht 70.0 in | Wt 139.8 lb

## 2022-11-10 DIAGNOSIS — J029 Acute pharyngitis, unspecified: Secondary | ICD-10-CM

## 2022-11-10 DIAGNOSIS — J309 Allergic rhinitis, unspecified: Secondary | ICD-10-CM

## 2022-11-10 LAB — POCT RAPID STREP A (OFFICE): Rapid Strep A Screen: NEGATIVE

## 2022-11-10 LAB — POC COVID19 BINAXNOW: SARS Coronavirus 2 Ag: NEGATIVE

## 2022-11-10 NOTE — Progress Notes (Signed)
Patient ID: David Armstrong, male    DOB: 1951/03/25, 72 y.o.   MRN: [id number]  Chief Complaint  Patient presents with  . Sore Throat    Starts over the weekend, no fever. Sore throat and HA.    HPI:      URI sx:   pt reports sx started over the w/e, denies fever or body aches, states he has sore throat and headache. States since he had Covid at the beginning of the year he has had continued nasal drainage, morning cough to clear secretions. Also reports he has a hiatal hernia and wonders if his throat irritation and morning cough is related to that.  Assessment & Plan:  1. Sore throat rapid strep & covid are neg. Advised pt to take Tylenol 1,000mg  prn for sore throat pain. Gargle with warm salt water several tid. Recommend starting generic Pepcid at bedtime to rule out any reflux. Drink plenty of water. Let PCP know if sx are persisting.  - POC COVID-19 - POCT rapid strep A  2. Allergic rhinitis, unspecified seasonality, unspecified trigger having runny nose, throat mucus, am coughing with phlegm. Advised restarting his Flonase nasal spray bid x3d, then daily, reports he has refills on his RX. Reminded pt on proper use.   Subjective:    Outpatient Medications Prior to Visit  Medication Sig Dispense Refill  . baclofen (LIORESAL) 10 MG tablet Take 0.5-1 tablets (5-10 mg total) by mouth 3 (three) times daily. 30 each 0  . desonide (DESOWEN) 0.05 % cream Apply topically 2 (two) times daily. 30 g 0  . desonide (DESOWEN) 0.05 % lotion Apply topically 2 (two) times daily. 59 mL 0  . docusate sodium (COLACE) 100 MG capsule Take 1 capsule (100 mg total) by mouth daily as needed for up to 30 doses. 30 capsule 0  . fluticasone (FLONASE) 50 MCG/ACT nasal spray Place 2 sprays into both nostrils daily. USE twice a day for 1 week, then reduce to using daily. 16 g 6  . hyoscyamine (LEVSIN SL) 0.125 MG SL tablet DISSOLVE 2 TABLETS UNDER THE TONGUE EVERY 6 HOURS AS NEEDED 720 tablet 0  .  ketoconazole (NIZORAL) 2 % shampoo Apply 1 Application topically daily. 120 mL 0  . LORazepam (ATIVAN) 1 MG tablet Take 0.5-1 tablets (0.5-1 mg total) by mouth every 8 (eight) hours as needed for anxiety. 90 tablet 0  . omeprazole (PRILOSEC) 20 MG capsule Take 1 capsule (20 mg total) by mouth daily. 90 capsule 3  . oxyCODONE-acetaminophen (PERCOCET/ROXICET) 5-325 MG tablet Take 1 tablet by mouth every 6 (six) hours as needed for up to 18 doses for severe pain. 18 tablet 0  . rivaroxaban (XARELTO) 20 MG TABS tablet Take 1 tablet (20 mg total) by mouth daily with supper. START this only after you have completed the starter pack. 90 tablet 1  . senna-docusate (SENOKOT-S) 8.6-50 MG tablet Take 1 tablet by mouth at bedtime as needed for mild constipation. 20 tablet 0  . traMADol (ULTRAM) 50 MG tablet Take 1 tablet (50 mg total) by mouth 3 (three) times daily as needed for moderate pain or severe pain. 30 tablet 0  . triamcinolone cream (KENALOG) 0.1 % Apply 1 Application topically 2 (two) times daily.    . Vitamin D, Ergocalciferol, (DRISDOL) 1.25 MG (50000 UNIT) CAPS capsule Take 50,000 Units by mouth once a week.    Marland Kitchen XDEMVY 0.25 % SOLN Apply to eye.     No facility-administered medications prior to visit.  Past Medical History:  Diagnosis Date  . Anxiety   . Arthritis    "lower back; hips;' right knee"   . Bipolar disorder (HCC)   . Chronic constipation   . Depression   . Diverticulosis of colon   . Edema of both lower extremities    ANKLES  . Fatty liver   . GERD (gastroesophageal reflux disease)   . Hiatal hernia   . History of anal fissures   . History of deep vein thrombosis (DVT) of lower extremity 02/11/2016   right lower extremity distal and proximal extensive acute  . History of Helicobacter pylori infection 2012; 2009  . History of kidney stones   . History of pneumonia 01/2015   CAP  . 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 Hyperlipidemia    . IBS (irritable bowel syndrome)   . Left ureteral stone   . Nocturia   . Peripheral neuropathy    per pt due to back DDD  . Personal history of colonic polyps    HYPERPLASTIC POLYP  . Pulmonary nodule, right    incidental finding CT chest angio 02-11-2016 stable  . TMJ (temporomandibular joint syndrome)   . Varicosities of leg    Past Surgical History:  Procedure Laterality Date  . BELPHAROPTOSIS REPAIR Bilateral 01/2015  . CATARACT EXTRACTION W/ INTRAOCULAR LENS  IMPLANT, BILATERAL  04/2016  . COLONOSCOPY WITH ESOPHAGOGASTRODUODENOSCOPY (EGD)  last one 03-02-2013  . CYSTOSCOPY/RETROGRADE/URETEROSCOPY/STONE EXTRACTION WITH BASKET  2010  . CYSTOSCOPY/URETEROSCOPY/HOLMIUM LASER/STENT PLACEMENT Left 03/16/2017   Procedure: CYSTOSCOPY/RETROGRADE/URETEROSCOPY/HOLMIUM LASER/STENT PLACEMENT, STONE BASKET EXTRACTION AND STENT PLACEMENT;  Surgeon: Ihor Gully, MD;  Location: [facility];  Service: Urology;  Laterality: Left;  . EXCISIONAL HEMORRHOIDECTOMY  2015 approx.  Marland Kitchen EXTRACORPOREAL SHOCK WAVE LITHOTRIPSY  1987; 1992  . EXTRACORPOREAL SHOCK WAVE LITHOTRIPSY Left 09/05/2022   Procedure: LEFT EXTRACORPOREAL SHOCK WAVE LITHOTRIPSY (ESWL);  Surgeon: Jannifer Hick, MD;  Location: [facility];  Service: Urology;  Laterality: Left;  . EXTRACORPOREAL SHOCK WAVE LITHOTRIPSY Right 09/22/2022   Procedure: RIGHT EXTRACORPOREAL SHOCK WAVE LITHOTRIPSY (ESWL);  Surgeon: Marcine Matar, MD;  Location: [facility];  Service: Urology;  Laterality: Right;  . FOOT NEUROMA SURGERY Bilateral left 09/2015; right 04/ 2017   morton' neuroma  . FOOT NEUROMA SURGERY Right 2004   morton's neuroma and removal heel spur  . HAMMER TOE SURGERY Left 2013   5th toe  . INCISION AND DRAINAGE FOOT Left 1992   "ball of foot; stepped on piece of hard wire & it went thru my shoe"  . IR ANGIOGRAM PULMONARY BILATERAL SELECTIVE  12/24/2021  . IR ANGIOGRAM SELECTIVE EACH  ADDITIONAL VESSEL  12/24/2021  . IR ANGIOGRAM SELECTIVE EACH ADDITIONAL VESSEL  12/24/2021  . IR INFUSION THROMBOL ARTERIAL INITIAL (MS)  12/24/2021  . IR INFUSION THROMBOL ARTERIAL INITIAL (MS)  12/24/2021  . IR THROMB F/U EVAL ART/VEN FINAL DAY (MS)  12/25/2021  . IR US GUIDE VASC ACCESS RIGHT  12/24/2021  . KNEE ARTHROSCOPY Bilateral left x2 1997;  right 2004 & 2005  . NASAL FRACTURE SURGERY  1997  . PATELLA-FEMORAL ARTHROPLASTY Right 07-06-2003   dr Renae Fickle [facility]  . PLANTAR FASCIA RELEASE Bilateral 1987 & 1993  . RHINOPLASTY  2000  . SHOULDER ARTHROSCOPY Left 2012;  2013  . SHOULDER ARTHROSCOPY WITH DISTAL CLAVICLE RESECTION Right 07-31-2005   dr Eulah Pont  [facility]   w/ Debridement labral and adhesions, acromioplasty, bursectomy, and CA ligament release  .  TRANSTHORACIC ECHOCARDIOGRAM  02/12/2016   ef 65-70%, grade 1 diastolic dysfunction/  trivial TR   Allergies  Allergen Reactions  . Effexor Xr [Venlafaxine Hcl Er] Other (See Comments)    Insomnia  . Acyclovir And Related   . Zocor [Simvastatin] Other (See Comments)    Muscle ache/ pain  . Dexilant [Dexlansoprazole] Other (See Comments)    Muscle aches  . Ivp Dye [Iodinated Contrast Media] Rash  . Soma [Carisoprodol] Other (See Comments)    Sores on arm  . Sulfa Antibiotics Rash      Objective:    Physical Exam Vitals and nursing note reviewed.  Constitutional:      General: He is not in acute distress.    Appearance: Normal appearance.  HENT:     Head: Normocephalic.     Right Ear: Tympanic membrane and ear canal normal.     Left Ear: Tympanic membrane and ear canal normal.     Nose: Rhinorrhea present.     Right Sinus: No maxillary sinus tenderness or frontal sinus tenderness.     Left Sinus: No maxillary sinus tenderness or frontal sinus tenderness.     Mouth/Throat:     Mouth: Mucous membranes are moist.     Pharynx: No pharyngeal swelling, oropharyngeal exudate, posterior oropharyngeal erythema or uvula swelling.      Tonsils: No tonsillar exudate or tonsillar abscesses.  Cardiovascular:     Rate and Rhythm: Normal rate and regular rhythm.  Pulmonary:     Effort: Pulmonary effort is normal.     Breath sounds: Normal breath sounds.  Musculoskeletal:        General: Normal range of motion.     Cervical back: Normal range of motion.  Lymphadenopathy:     Head:     Right side of head: No preauricular or posterior auricular adenopathy.     Left side of head: No preauricular or posterior auricular adenopathy.     Cervical: No cervical adenopathy.  Skin:    General: Skin is warm and dry.  Neurological:     Mental Status: He is alert and oriented to person, place, and time.  Psychiatric:        Mood and Affect: Mood normal.   BP 118/68   Pulse 83   Temp 97.8 F (36.6 C)   Ht 5\' 10"  (1.778 m)   Wt 139 lb 12.8 oz (63.4 kg)   SpO2 99%   BMI 20.06 kg/m  Wt Readings from Last 3 Encounters:  11/10/22 139 lb 12.8 oz (63.4 kg)  10/02/22 140 lb (63.5 kg)  09/22/22 137 lb 11.2 oz (62.5 kg)      Dulce Sellar, NP

## 2022-11-13 ENCOUNTER — Telehealth: Payer: Self-pay | Admitting: Family Medicine

## 2022-11-13 NOTE — Telephone Encounter (Signed)
Selita with Dr. Annabell Howells [facility] states Patient is having Lithotripsy and Dr. Annabell Howells requests Patient stop taking Xarelto 72 hours prior to procedure on 11/17/22.  Selita requests to be called at ph# [phone] , Ext [phone]. States they will need a note stating that Patient is okay to stop the Xarelto. States will fax form/note to Fax# [phone] now for Dr. Jimmey Ralph to fill out and sign.  Requests fax note back to Fax# [phone].

## 2022-11-13 NOTE — Telephone Encounter (Signed)
Please advise 

## 2022-11-14 NOTE — Progress Notes (Signed)
Talked with patient. Instructions given arrival time 43. Meds and hx reviewed. NPO after MN bring folder in. driver secured

## 2022-11-14 NOTE — Telephone Encounter (Signed)
Caller called for an update on clearance. I informed caller that PCP is out of office and won't be back until Monday., the day of patient's surgery. Caller stated they found out last minute that this clearance was needed and they don't want to have to move patient if not needed. Caller requested if another provider could sign off on this.   I spoke with my supervisor, Tammy Peace, who recommended I ask Dr. Durene Cal but due to the time of day, Dr. Durene Cal wouldn't be available until after office hours. I informed caller of this and recommended she go ahead and reschedule patient since PCP and back up provider are not available at this time. Caller verbalized understanding.

## 2022-11-14 NOTE — Telephone Encounter (Signed)
Yes it is ok for him to hold for 72 hours prior to procedure.  Katina Degree. Jimmey Ralph, MD 11/14/2022 5:44 PM

## 2022-11-17 NOTE — Telephone Encounter (Signed)
Form received today placed in PCP desk to be reviewed

## 2022-11-17 NOTE — H&P (Signed)
09/15/22: Elvin returns today in f/u from a left ESWL for an 8-7mm proximal stone. He has passed multiple fragments. The KUB today shows the right proximal 8mm stone that has not progressed and 2 LLP stones that are 7-53mm. The left proximal stone has passed. His UA has 10-20 RBC's. He has mild left back pain. His IPSS is 8 with nocturia x 1.   10/06/2022: Keylor presents today in follow-up from undergoing right ESWL. He endorses some right lower quadrant pain that is intermittent and urinary urgency as well as spasms, that radiate to the left side. He denies gross hematuria or fevers. He did pass a larger stone fragment as well as a few small ones. He has not had fevers or chills. He is not overly bothered by his symptoms.   10/23/22: He presents today for follow-up after undergoing right-sided lithotripsy. He brings with him several stone fragments. KUB showed any further remaining fragments at this time. He is voiding back to his baseline and denies pain.     ALLERGIES: Contrast Media Ready-Box MISC - Skin Rash Soma Compound TABS - Skin Rash Sulfa Drugs - Other Reaction, unknown    MEDICATIONS: Doxycycline Hyclate  Tamsulosin Hcl 0.4 mg capsule 1 capsule PO Daily  Ativan 1 MG Oral Tablet Oral  Ondansetron Odt 8 mg tablet,disintegrating 1 tablet PO Q 8 H PRN  Xarelto 20 mg tablet     GU PSH: Cystoscopy - 2018, 2017 Cystoscopy Ureteroscopy - 2010, 2010, 2010 ESWL - 2010 Locm 300-399Mg /Ml Iodine,1Ml - 2018 Ureteroscopic laser litho - 2018 Ureteroscopic stone removal - 2018       PSH Notes: oral surgery 08/2016- compound bone graft     NON-GU PSH: Cataract surgery, Bilateral Foot surgery (unspecified), Bilateral Knee Arthroscopy/surgery Revise Knee Joint - 2013 Shoulder Surgery (Unspecified), X2 Visit Complexity (formerly GPC1X) - 09/15/2022     GU PMH: Pelvic/perineal pain - 10/06/2022, He has some pain at the penile base with the onset of voiding but no evidence of infection  prostatitis. I am going to have him return for a PT eval and treat. He will return to see me in 3 months for possible cystoscopy. , - 12/12/2020 Renal calculus - 10/06/2022, He has stable LLP stones. He will need to be considered for another ESWL after he recovers from the right ureteral ESWL. , - 09/15/2022, - 08/22/2022, - 05/23/2022 (Stable), He has stable stones and I will have him return in 6 months for a KUB and F/U. , - 03/14/2022, He has bilateral stones, L > R, with significant growth since 2018. I discussed options including observation, URS and ESWL. He would prefer ESWL but would like to recheck in 6 months before committing to treatment. He is on Xarelto which we would need to have him cleared to stop before ESWL. I will schedule a 6 month f/u. , - 10/28/2021, He has stable left renal stones with some increased microhematuria but I don't see a ureteral stone to explain his flank pain which is very likely musculoskeletal. , - 2023, He has an increased left renal stone burden with 4-5 stones with the largest about 5-22mm. He has no ureteral stones. , - 12/12/2020 (Stable), - 2022 (Stable), Left, His ultrasound has revealed a single punctate stone in the left kidney. I therefore have given him information on stone prevention as far as dietary modifications and the need for increased fluid intake., - 2019 (Stable), Bilateral, His bilateral renal calculi were again noted and are nonobstructing, - 2018 (Stable),  Bilateral, Stones in his left kidney remain unchanged. He is not passing the stone currently., - 2017, Kidney stone on left side, - 2017 Ureteral calculus, Left - 10/06/2022, Left, He has cleared the treated stone but has a stable right ureteral stone. He needs repeat ESWL. , - 09/15/2022, Left, - 08/22/2022, Left, - 06/12/2022, Left, - 05/28/2022, Left, He has a 1 cm stone adjacent to the L4 transverse process on the left with Hounsfield units of 1200. He has undergone previous ureteroscopic stone management and  has elected to proceed with that form of therapy at this time., - 2018 Microscopic hematuria - 08/22/2022, Stable microhematuria. , - 03/14/2022, 3+ blood on the dip. QNS to spin. , - 10/28/2021 (Worsening, Chronic), Chronic intermittent microscopic hematuria. Will have pt RTC for possible cysto w/Dr. Vernie Ammons. With not overt hydronephrosis noted and last CT 10/17 will hold off on CT at this time. With several large left renal calculi noted this could be contributing to microscopic hematuria and may need elective therapy of stones. , - 2018 Flank Pain - 05/28/2022, The pain is most likely musculoskeletal as there are no ureteral stones noted. , - 10/28/2021, - 09/05/2021, - 06/18/2021, - 2023 Gross hematuria (Stable) - 05/23/2022, (Stable, Chronic), He has not seen any further gross hematuria with his decreased dosage of Xarelto. I told him if he sees any further hematuria or develops any new pain he should contact me otherwise I will plan to see him back on a yearly basis for KUB as well as PSA and DRE., - 2017 (Acute), No signs of UTI. Significant microscopic hematuria noted. Will have pt RTC for possible cysto w/Dr. Vernie Ammons. Recommend he f/u w/PCP about possible need to stop/change Xarelto. , - 2017 Personal Hx Urinary Tract Infections, No infection seen. - 10/28/2021 BPH w/LUTS - 09/05/2021, He will stay on tamsulosin. , - 12/12/2020, Benign prostatic hyperplasia with urinary obstruction, - 2017 Dysuria - 09/05/2021, - 2021 Obstructive and reflux uropathy, Unspec - 09/05/2021 Microscopic hematuria - 06/18/2021, He last was evaluated in 2018 with CT and cystoscopy. I will get a CT ordered and have him return for possible cystoscopy. , - 2023 (Stable), It appears his hematuria is secondary to the presence of a left ureteral stone., - 2018 (Stable), Although he has had a renal ultrasound and does have known stones he has had recurrent UTIs and has persistent microscopic hematuria and therefore I have recommended evaluating  his upper tract with a CT scan as a renal ultrasound is not able to detect urothelial lesions. He underwent cystoscopy in 8/18 with no significant abnormality noted., - 2018 (Stable), I found no abnormality on cystoscopy. It would appear his microscopic hematuria is from a benign source and with known left renal calculi that's going to be the most likely cause., - 2018 Nocturia - 12/12/2020, (Stable), When he returns we will discuss the use of tamsulosin for his voiding symptoms., - 2018 BPH w/o LUTS - 11/08/2020, - 2022 (Stable), He does have BPH by exam but is not having any obstructive or irritative voiding symptoms., - 2020 Left testicular pain - 11/08/2020 Weak Urinary Stream - 2021, - 2021, - 2021, - 2021 Urinary Retention (Stable) - 2021, - 2021 Urinary Frequency, My suspicion is that his acute onset of frequency may be due to UTI so I will culture his urine and treat according to sensitivities. - 2018 Disorder of male genital organs, unspecified (Improving), Interval resolution of a glans penis lesion most consistent with an inflammatory/infectious etiology. I have  reassured him and he will return to see me as needed. - 2018, He has developed a reddened area on the glans penis. We discussed the possible etiologies including mycotic infection versus superficial squamous cell carcinoma. I recommendation is a trial of topical medication and reassessment with biopsy if it does not improve., - 2018 Lower abdominal pain, unspecified (Acute), It appears that his groin pain is most likely musculoskeletal as he had no abnormality of the testicle on exam with no hernia and no abnormality noted on CT. - 2017 LLQ pain, Abdominal pain, LLQ (left lower quadrant) - 2017 ED due to arterial insufficiency, Erectile dysfunction due to arterial insufficiency - 2017 Encounter for Prostate Cancer screening, Prostate cancer screening - 2017 Unil Inguinal Hernia W/O obst or gang,non-recurrent, Inguinal hernia, right -  2014      PMH Notes: Microscopic hematuria: In 8/18 he was found to have microscopic hematuria again. A renal ultrasound was performed which revealed multiple calcifications within the left kidney consistent with stones and microscopic hematuria. No hydronephrosis was present on the ultrasound. A KUB revealed several left renal stones almost certainly resulting in his microscopic hematuria. Cystoscopy was negative for any intravesical lesions.   NON-GU PMH: Sebaceous cyst, We discussed the fact that the sebaceous cyst is a benign finding and that I agreed with his dermatologist that I would not recommend any form of surgical therapy unless it became much bigger or became infected. - 2019 Bacteriuria, He did have bacteriuria today. - 2018 Pyuria/other UA findings, I noted pyuria associated with his bacteriuria. Urine culture will be performed to rule out a UTI as the source of his recent change in voiding pattern. - 2018 Encounter for general adult medical examination without abnormal findings, Encounter for preventive health examination - 2015 Anxiety, Anxiety (Symptom) - 2014 Personal history of other diseases of the digestive system, History of esophageal reflux - 2014 Personal history of other mental and behavioral disorders, History of depression - 2014 Acute embolism and thrombosis of unspecified deep veins of right lower extremity Pulmonary Embolism, History    FAMILY HISTORY: Death In The Family Father - Father Death In The Family Mother - Mother Heart Disease - Runs In Family nephrolithiasis - Runs In Family No pertinent family history - Other Stroke Syndrome - Runs In Family   SOCIAL HISTORY: Marital Status: Single Preferred Language: English; Ethnicity: Not Hispanic Or Latino; Race: White Current Smoking Status: Patient has never smoked.  Has never drank.  Drinks 1 caffeinated drink per day.    REVIEW OF SYSTEMS:    GU Review Male:   Patient denies frequent urination, hard to  postpone urination, burning/ pain with urination, get up at night to urinate, leakage of urine, stream starts and stops, trouble starting your stream, have to strain to urinate , erection problems, and penile pain.  Gastrointestinal (Upper):   Patient denies nausea, vomiting, and indigestion/ heartburn.  Gastrointestinal (Lower):   Patient denies diarrhea and constipation.  Constitutional:   Patient denies fever, night sweats, weight loss, and fatigue.  Skin:   Patient denies skin rash/ lesion and itching.  Eyes:   Patient denies blurred vision and double vision.  Musculoskeletal:   Patient denies back pain and joint pain.  Neurological:   Patient denies headaches and dizziness.  Psychologic:   Patient denies depression and anxiety.   VITAL SIGNS:      10/23/2022 10:53 AM  BP 116/74 mmHg  Pulse 72 /min  Temperature 98.6 F / 37 C   MULTI-SYSTEM  PHYSICAL EXAMINATION:    Constitutional: Well-nourished. No physical deformities. Normally developed. Good grooming.  Respiratory: No labored breathing, no use of accessory muscles.   Cardiovascular: Normal temperature, normal extremity pulses, no swelling, no varicosities.  Skin: No paleness, no jaundice, no cyanosis. No lesion, no ulcer, no rash.  Neurologic / Psychiatric: Oriented to time, oriented to place, oriented to person. No depression, no anxiety, no agitation.  Gastrointestinal: No mass, no tenderness, no rigidity, non obese abdomen.     Complexity of Data:  Source Of History:  Patient  Records Review:   Previous Doctor Records, Previous Patient Records  Urine Test Review:   Urinalysis  X-Ray Review: KUB: Reviewed Films. Reviewed Report. Discussed With Patient.     06/24/21 02/24/19 07/05/15 03/31/14 03/31/13 01/27/12  PSA  Total PSA 0.91 ng/mL 1.42 ng/mL 0.95  0.70  0.58  0.71     10/23/22  Urinalysis  Urine Appearance Clear   Urine Color Yellow   Urine Glucose Neg mg/dL  Urine Bilirubin Neg mg/dL  Urine Ketones Neg mg/dL   Urine Specific Gravity 1.025   Urine Blood 1+ ery/uL  Urine pH 6.0   Urine Protein Neg mg/dL  Urine Urobilinogen 0.2 mg/dL  Urine Nitrites Neg   Urine Leukocyte Esterase Neg leu/uL  Urine WBC/hpf 0 - 5/hpf   Urine RBC/hpf 3 - 10/hpf   Urine Epithelial Cells NS (Not Seen)   Urine Bacteria Few (10-25/hpf)   Urine Mucous Present   Urine Yeast NS (Not Seen)   Urine Trichomonas Not Present   Urine Cystals NS (Not Seen)   Urine Casts NS (Not Seen)   Urine Sperm Not Present    PROCEDURES:         KUB - 16109  A single view of the abdomen is obtained. Right renal shadow is clear. LEft renal shadow with multiple stones there are two large stones in the left lower pole measuring about 5 mm.       . Patient confirmed No Neulasta OnPro Device.            Urinalysis w/Scope Dipstick Dipstick Cont'd Micro  Color: Yellow Bilirubin: Neg mg/dL WBC/hpf: 0 - 5/hpf  Appearance: Clear Ketones: Neg mg/dL RBC/hpf: 3 - 60/AVW  Specific Gravity: 1.025 Blood: 1+ ery/uL Bacteria: Few (10-25/hpf)  pH: 6.0 Protein: Neg mg/dL Cystals: NS (Not Seen)  Glucose: Neg mg/dL Urobilinogen: 0.2 mg/dL Casts: NS (Not Seen)    Nitrites: Neg Trichomonas: Not Present    Leukocyte Esterase: Neg leu/uL Mucous: Present      Epithelial Cells: NS (Not Seen)      Yeast: NS (Not Seen)      Sperm: Not Present    ASSESSMENT:      ICD-10 Details  1 GU:   Microscopic hematuria - R31.1 Chronic, Stable  2   Flank Pain - R10.84 Left, Chronic, Stable   PLAN:           Orders Labs Stone Analysis          Schedule Return Visit/Planned Activity: Next Available Appointment - Office Visit, Schedule Surgery          Document Letter(s):  Created for Patient: Clinical Summary         Notes:   Urine does show microscopic hematuria. His pain and symptoms have resolved after passing 2 stone fragments. He does have multiple stones within the left renal shadow. He would prefer lithotripsy on the remaining left renal stones.  I will place a ESWL sheet for  the schedulers in the next few weeks to address the left lower pole stones. General stone prevention strategies were discussed in detail today including: Increased hydration 2-1/2-3 and half liters of water per day, limiting sodium intake to less than 3000 mg/day, limiting animal protein to less than 10 ounces per day, the addition of lemon to water, maintaining a normal calcium diet, and limiting oxalate rich foods.

## 2022-11-18 NOTE — Progress Notes (Signed)
Left voice mail to return call for instructions 

## 2022-11-18 NOTE — Telephone Encounter (Signed)
Urology form faxed to [phone] on 11/17/2022 Form placed to be scan in Patient chart

## 2022-11-21 ENCOUNTER — Other Ambulatory Visit: Payer: Self-pay | Admitting: Urology

## 2022-11-24 ENCOUNTER — Ambulatory Visit (HOSPITAL_BASED_OUTPATIENT_CLINIC_OR_DEPARTMENT_OTHER)
Admission: RE | Admit: 2022-11-24 | Discharge: 2022-11-24 | Disposition: A | Discharge status: Home / Self Care | Payer: Medicare Other | Source: Ambulatory Visit | Attending: Urology | Admitting: Urology

## 2022-11-24 ENCOUNTER — Encounter (HOSPITAL_BASED_OUTPATIENT_CLINIC_OR_DEPARTMENT_OTHER): Payer: Self-pay | Admitting: Urology

## 2022-11-24 ENCOUNTER — Ambulatory Visit (HOSPITAL_COMMUNITY): Payer: Medicare Other

## 2022-11-24 ENCOUNTER — Encounter (HOSPITAL_BASED_OUTPATIENT_CLINIC_OR_DEPARTMENT_OTHER)
Admission: RE | Disposition: A | Discharge status: Home / Self Care | Payer: Self-pay | Source: Ambulatory Visit | Attending: Urology

## 2022-11-24 ENCOUNTER — Other Ambulatory Visit: Payer: Self-pay

## 2022-11-24 DIAGNOSIS — Z86718 Personal history of other venous thrombosis and embolism: Secondary | ICD-10-CM | POA: Diagnosis not present

## 2022-11-24 DIAGNOSIS — F419 Anxiety disorder, unspecified: Secondary | ICD-10-CM | POA: Insufficient documentation

## 2022-11-24 DIAGNOSIS — N200 Calculus of kidney: Secondary | ICD-10-CM | POA: Insufficient documentation

## 2022-11-24 DIAGNOSIS — F319 Bipolar disorder, unspecified: Secondary | ICD-10-CM | POA: Diagnosis not present

## 2022-11-24 DIAGNOSIS — Z86711 Personal history of pulmonary embolism: Secondary | ICD-10-CM | POA: Insufficient documentation

## 2022-11-24 DIAGNOSIS — I878 Other specified disorders of veins: Secondary | ICD-10-CM | POA: Diagnosis not present

## 2022-11-24 DIAGNOSIS — Z7901 Long term (current) use of anticoagulants: Secondary | ICD-10-CM | POA: Insufficient documentation

## 2022-11-24 HISTORY — PX: EXTRACORPOREAL SHOCK WAVE LITHOTRIPSY: SHX1557

## 2022-11-24 SURGERY — LITHOTRIPSY, ESWL
Anesthesia: LOCAL | Laterality: Left

## 2022-11-24 MED ORDER — CIPROFLOXACIN HCL 500 MG PO TABS
500.0000 mg | ORAL_TABLET | ORAL | Status: AC
  Administered 2022-11-24: 500 mg via ORAL

## 2022-11-24 MED ORDER — DIAZEPAM 5 MG PO TABS
10.0000 mg | ORAL_TABLET | ORAL | Status: AC
  Administered 2022-11-24: 10 mg via ORAL

## 2022-11-24 MED ORDER — DIPHENHYDRAMINE HCL 25 MG PO CAPS
25.0000 mg | ORAL_CAPSULE | ORAL | Status: AC
  Administered 2022-11-24: 25 mg via ORAL

## 2022-11-24 MED ORDER — TRAMADOL HCL 50 MG PO TABS: 50.0000 mg | ORAL_TABLET | Freq: Four times a day (QID) | ORAL | 0 refills | Status: AC | PRN

## 2022-11-24 MED ORDER — CIPROFLOXACIN HCL 500 MG PO TABS
ORAL_TABLET | ORAL | Status: AC
  Filled 2022-11-24: qty 1

## 2022-11-24 MED ORDER — SODIUM CHLORIDE 0.9 % IV SOLN: INTRAVENOUS | Status: DC

## 2022-11-24 MED ORDER — DIPHENHYDRAMINE HCL 25 MG PO CAPS
ORAL_CAPSULE | ORAL | Status: AC
  Filled 2022-11-24: qty 1

## 2022-11-24 MED ORDER — DIAZEPAM 5 MG PO TABS
ORAL_TABLET | ORAL | Status: AC
  Filled 2022-11-24: qty 2

## 2022-11-24 NOTE — Op Note (Signed)
[facility] Operative Note  Treating Physician: Rhoderick Moody, MD  Pre-op diagnosis: Left renal stones  Post-op diagnosis: Same   Procedure: LEFT ESWL  See [facility] Brenner OP note scanned into chart. Also because of the size, density, location and other factors that cannot be anticipated I feel this will likely be a staged procedure. This fact supersedes any indication in the scanned [place] [facility] operative note to the contrary

## 2022-11-24 NOTE — H&P (Signed)
See scanned [facility] documents for H&P.   

## 2022-11-25 ENCOUNTER — Encounter (HOSPITAL_BASED_OUTPATIENT_CLINIC_OR_DEPARTMENT_OTHER): Payer: Self-pay | Admitting: Urology

## 2022-11-26 DIAGNOSIS — R350 Frequency of micturition: Secondary | ICD-10-CM | POA: Diagnosis not present

## 2022-11-26 DIAGNOSIS — R310 Gross hematuria: Secondary | ICD-10-CM | POA: Diagnosis not present

## 2022-11-27 DIAGNOSIS — M5417 Radiculopathy, lumbosacral region: Secondary | ICD-10-CM | POA: Diagnosis not present

## 2022-11-27 DIAGNOSIS — M5412 Radiculopathy, cervical region: Secondary | ICD-10-CM | POA: Diagnosis not present

## 2022-11-27 DIAGNOSIS — G603 Idiopathic progressive neuropathy: Secondary | ICD-10-CM | POA: Diagnosis not present

## 2022-12-01 DIAGNOSIS — N200 Calculus of kidney: Secondary | ICD-10-CM | POA: Diagnosis not present

## 2022-12-03 ENCOUNTER — Other Ambulatory Visit: Payer: Self-pay | Admitting: Pain Medicine

## 2022-12-03 DIAGNOSIS — M545 Low back pain, unspecified: Secondary | ICD-10-CM

## 2022-12-09 DIAGNOSIS — L820 Inflamed seborrheic keratosis: Secondary | ICD-10-CM | POA: Diagnosis not present

## 2022-12-09 DIAGNOSIS — L538 Other specified erythematous conditions: Secondary | ICD-10-CM | POA: Diagnosis not present

## 2022-12-09 DIAGNOSIS — L218 Other seborrheic dermatitis: Secondary | ICD-10-CM | POA: Diagnosis not present

## 2022-12-09 DIAGNOSIS — R208 Other disturbances of skin sensation: Secondary | ICD-10-CM | POA: Diagnosis not present

## 2022-12-11 DIAGNOSIS — M5417 Radiculopathy, lumbosacral region: Secondary | ICD-10-CM | POA: Diagnosis not present

## 2022-12-18 DIAGNOSIS — H01009 Unspecified blepharitis unspecified eye, unspecified eyelid: Secondary | ICD-10-CM | POA: Diagnosis not present

## 2022-12-22 DIAGNOSIS — M9905 Segmental and somatic dysfunction of pelvic region: Secondary | ICD-10-CM | POA: Diagnosis not present

## 2022-12-22 DIAGNOSIS — M9904 Segmental and somatic dysfunction of sacral region: Secondary | ICD-10-CM | POA: Diagnosis not present

## 2022-12-22 DIAGNOSIS — M5137 Other intervertebral disc degeneration, lumbosacral region: Secondary | ICD-10-CM | POA: Diagnosis not present

## 2022-12-22 DIAGNOSIS — M9903 Segmental and somatic dysfunction of lumbar region: Secondary | ICD-10-CM | POA: Diagnosis not present

## 2022-12-24 ENCOUNTER — Ambulatory Visit (INDEPENDENT_AMBULATORY_CARE_PROVIDER_SITE_OTHER): Payer: Medicare Other | Admitting: Gastroenterology

## 2022-12-24 ENCOUNTER — Encounter: Payer: Self-pay | Admitting: Gastroenterology

## 2022-12-24 VITALS — BP 122/80 | HR 68 | Ht 70.0 in | Wt 140.0 lb

## 2022-12-24 DIAGNOSIS — Z7901 Long term (current) use of anticoagulants: Secondary | ICD-10-CM

## 2022-12-24 DIAGNOSIS — K219 Gastro-esophageal reflux disease without esophagitis: Secondary | ICD-10-CM

## 2022-12-24 DIAGNOSIS — R1310 Dysphagia, unspecified: Secondary | ICD-10-CM | POA: Insufficient documentation

## 2022-12-24 DIAGNOSIS — R1084 Generalized abdominal pain: Secondary | ICD-10-CM | POA: Diagnosis not present

## 2022-12-24 DIAGNOSIS — Z1211 Encounter for screening for malignant neoplasm of colon: Secondary | ICD-10-CM

## 2022-12-24 MED ORDER — NA SULFATE-K SULFATE-MG SULF 17.5-3.13-1.6 GM/177ML PO SOLN: 1.0000 | Freq: Once | ORAL | 0 refills | Status: AC

## 2022-12-24 NOTE — Progress Notes (Signed)
12/24/2022 David Armstrong [id number] [birth year]   HISTORY OF PRESENT ILLNESS: This is a 72 year old male who is a patient of Dr. Lauro Franklin.  He has not been seen here since 2018 and it looks like he went to see Dr. Kinnie Scales couple of times in 2019 and 2020 for hemorrhoid bandings, etc.  Now he is back here.  His last colonoscopy was in February 2014 at which time he only had diverticulosis so repeat was recommended in 10 years.  He denies any rectal bleeding.  Has some constipation, which he manages with needed just occasional Dulcolax as needed.  He reports issues with acid reflux that are ongoing.  He is on omeprazole 20 mg daily.  He reports issues with swallowing certain foods, he says usually when he eats out at a [employer] and that is usually only time that he eats rice.  He reports abdominal pain in his mid abdomen that he says is usually worse with bending down or leaning forward, coughing.  He is on Xarelto for history of DVT and bilateral PEs in 2017 at that recurrent submassive PEs requiring catheter guided lytic therapy by IR in September 2023 after not taking his Xarelto for about a month/4 weeks.  He recently had some compression fractures in his back repaired by kyphoplasty.  Past Medical History:  Diagnosis Date   Anxiety    Arthritis    "lower back; hips;' right knee"    Bipolar disorder (HCC)    Chronic constipation    Depression    Diverticulosis of colon    Edema of both lower extremities    ANKLES   Fatty liver    GERD (gastroesophageal reflux disease)    Hiatal hernia    History of anal fissures    History of deep vein thrombosis (DVT) of lower extremity 02/11/2016   right lower extremity distal and proximal extensive acute   History of Helicobacter pylori infection 2012; 2009   History of kidney stones    History of pneumonia 01/2015   CAP   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    Hyperlipidemia    IBS (irritable  bowel syndrome)    Left ureteral stone    Nocturia    Peripheral neuropathy    per pt due to back DDD   Personal history of colonic polyps    HYPERPLASTIC POLYP   Pulmonary nodule, right    incidental finding CT chest angio 02-11-2016 stable   TMJ (temporomandibular joint syndrome)    Varicosities of leg    Past Surgical History:  Procedure Laterality Date   BELPHAROPTOSIS REPAIR Bilateral 01/2015   CATARACT EXTRACTION W/ INTRAOCULAR LENS  IMPLANT, BILATERAL  04/2016   COLONOSCOPY WITH ESOPHAGOGASTRODUODENOSCOPY (EGD)  last one 03-02-2013   CYSTOSCOPY/RETROGRADE/URETEROSCOPY/STONE EXTRACTION WITH BASKET  2010   CYSTOSCOPY/URETEROSCOPY/HOLMIUM LASER/STENT PLACEMENT Left 03/16/2017   Procedure: CYSTOSCOPY/RETROGRADE/URETEROSCOPY/HOLMIUM LASER/STENT PLACEMENT, STONE BASKET EXTRACTION AND STENT PLACEMENT;  Surgeon: Ihor Gully, MD;  Location: [facility];  Service: Urology;  Laterality: Left;   EXCISIONAL HEMORRHOIDECTOMY  2015 approx.   EXTRACORPOREAL SHOCK WAVE LITHOTRIPSY  1987; 1992   EXTRACORPOREAL SHOCK WAVE LITHOTRIPSY Left 09/05/2022   Procedure: LEFT EXTRACORPOREAL SHOCK WAVE LITHOTRIPSY (ESWL);  Surgeon: Jannifer Hick, MD;  Location: [facility];  Service: Urology;  Laterality: Left;   EXTRACORPOREAL SHOCK WAVE LITHOTRIPSY Right 09/22/2022   Procedure: RIGHT EXTRACORPOREAL SHOCK WAVE LITHOTRIPSY (ESWL);  Surgeon: Marcine Matar, MD;  Location: [facility];  Service: Urology;  Laterality: Right;   EXTRACORPOREAL SHOCK WAVE LITHOTRIPSY Left 11/24/2022   Procedure: LEFT EXTRACORPOREAL SHOCK WAVE LITHOTRIPSY (ESWL);  Surgeon: Rene Paci, MD;  Location: [facility];  Service: Urology;  Laterality: Left;   FOOT NEUROMA SURGERY Bilateral left 09/2015; right 04/ 2017   morton' neuroma   FOOT NEUROMA SURGERY Right 2004   morton's neuroma and removal heel spur   HAMMER TOE SURGERY Left 2013   5th toe    INCISION AND DRAINAGE FOOT Left 1992   "ball of foot; stepped on piece of hard wire & it went thru my shoe"   IR ANGIOGRAM PULMONARY BILATERAL SELECTIVE  12/24/2021   IR ANGIOGRAM SELECTIVE EACH ADDITIONAL VESSEL  12/24/2021   IR ANGIOGRAM SELECTIVE EACH ADDITIONAL VESSEL  12/24/2021   IR INFUSION THROMBOL ARTERIAL INITIAL (MS)  12/24/2021   IR INFUSION THROMBOL ARTERIAL INITIAL (MS)  12/24/2021   IR THROMB F/U EVAL ART/VEN FINAL DAY (MS)  12/25/2021   IR US GUIDE VASC ACCESS RIGHT  12/24/2021   KNEE ARTHROSCOPY Bilateral left x2 1997;  right 2004 & 2005   NASAL FRACTURE SURGERY  1997   PATELLA-FEMORAL ARTHROPLASTY Right 07-06-2003   dr Renae Fickle [facility]   PLANTAR FASCIA RELEASE Bilateral 1987 & 1993   RHINOPLASTY  2000   SHOULDER ARTHROSCOPY Left 2012;  2013   SHOULDER ARTHROSCOPY WITH DISTAL CLAVICLE RESECTION Right 07-31-2005   dr [facility]   w/ Debridement labral and adhesions, acromioplasty, bursectomy, and CA ligament release   TRANSTHORACIC ECHOCARDIOGRAM  02/12/2016   ef 65-70%, grade 1 diastolic dysfunction/  trivial TR    reports that he has never smoked. He has never used smokeless tobacco. He reports that he does not drink alcohol and does not use drugs. family history includes Cirrhosis in his father; Heart disease in his brother, father, and mother; Parkinson's disease in his paternal grandfather; Prostate cancer in his maternal grandfather; Skin cancer in his father; Stroke in his mother. Allergies  Allergen Reactions   Effexor Xr [Venlafaxine Hcl Er] Other (See Comments)    Insomnia   Acyclovir And Related    Zocor [Simvastatin] Other (See Comments)    Muscle ache/ pain   Dexilant [Dexlansoprazole] Other (See Comments)    Muscle aches   Ivp Dye [Iodinated Contrast Media] Rash   Soma [Carisoprodol] Other (See Comments)    Sores on arm   Sulfa Antibiotics Rash      Outpatient Encounter Medications as of 12/24/2022  Medication Sig   desonide (DESOWEN) 0.05 % cream Apply  topically 2 (two) times daily.   desonide (DESOWEN) 0.05 % lotion Apply topically 2 (two) times daily.   fluticasone (FLONASE) 50 MCG/ACT nasal spray Place 2 sprays into both nostrils daily. USE twice a day for 1 week, then reduce to using daily.   hyoscyamine (LEVSIN SL) 0.125 MG SL tablet DISSOLVE 2 TABLETS UNDER THE TONGUE EVERY 6 HOURS AS NEEDED   LORazepam (ATIVAN) 1 MG tablet Take 0.5-1 tablets (0.5-1 mg total) by mouth every 8 (eight) hours as needed for anxiety.   omeprazole (PRILOSEC) 20 MG capsule Take 1 capsule (20 mg total) by mouth daily.   rivaroxaban (XARELTO) 20 MG TABS tablet Take 1 tablet (20 mg total) by mouth daily with supper. START this only after you have completed the starter pack.   traMADol (ULTRAM) 50 MG tablet Take 1 tablet (50 mg total) by mouth 3 (three) times daily as needed for moderate pain or severe pain.  triamcinolone cream (KENALOG) 0.1 % Apply 1 Application topically 2 (two) times daily.   Vitamin D, Ergocalciferol, (DRISDOL) 1.25 MG (50000 UNIT) CAPS capsule Take 50,000 Units by mouth once a week.   baclofen (LIORESAL) 10 MG tablet Take 0.5-1 tablets (5-10 mg total) by mouth 3 (three) times daily. (Patient not taking: Reported on 12/24/2022)   docusate sodium (COLACE) 100 MG capsule Take 1 capsule (100 mg total) by mouth daily as needed for up to 30 doses. (Patient not taking: Reported on 12/24/2022)   ketoconazole (NIZORAL) 2 % shampoo Apply 1 Application topically daily. (Patient not taking: Reported on 12/24/2022)   senna-docusate (SENOKOT-S) 8.6-50 MG tablet Take 1 tablet by mouth at bedtime as needed for mild constipation. (Patient not taking: Reported on 12/24/2022)   XDEMVY 0.25 % SOLN Apply to eye. (Patient not taking: Reported on 12/24/2022)   No facility-administered encounter medications on file as of 12/24/2022.    REVIEW OF SYSTEMS  : All other systems reviewed and negative except where noted in the History of Present Illness.   PHYSICAL EXAM: BP  122/80   Pulse 68   Ht 5\' 10"  (1.778 m)   Wt 140 lb (63.5 kg)   BMI 20.09 kg/m  General: Well developed white male in no acute distress Head: Normocephalic and atraumatic Eyes:  Sclerae anicteric, conjunctiva pink. Ears: Normal auditory acuity Lungs: Clear throughout to auscultation; no W/R/R. Heart: Regular rate and rhythm; no M/R/G. Abdomen: Soft, non-distended.  BS present.  Non-tender. Rectal:  Will be done at the time of colonoscopy. Musculoskeletal: Symmetrical with no gross deformities  Skin: No lesions on visible extremities Extremities: No edema  Neurological: Alert oriented x 4, grossly non-focal Psychological:  Alert and cooperative. Normal mood and affect  ASSESSMENT AND PLAN: *Colorectal cancer screening: Last colonoscopy November 2014.  Will schedule with Dr. Rhea Belton. *GERD and dysphagia: Reports issues with food getting stuck, he says usually when he eats [place] food, and that is usually about the only time that he ever eats rice.  Is on omeprazole 20 mg daily.  Will plan for EGD with possible dilation as well.  Consider esophageal biopsies to rule out EOE. *Abdominal pain: Generalized mid abdominal pain that seems to be worse with bending down, leaning forward, coughing, etc.  Question if this is more musculoskeletal.  Pending results of above if those are unremarkable and he continues to complain of pain consider CT scan to rule out any other intra-abdominal pathology. *Chronic anticoagulation with Xarelto for history of submassive PEs.  Had initial episode in 2017 DVTs and bilateral PEs and then again with submassive PEs requiring catheter guided lytic therapy in September 2023 after not taking his Xarelto for about a month.  He has normal renal function.  Will only hold Xarelto for 24 hours.  Will check with prescribing provider as well.  **The risks, benefits, and alternatives to EGD and colonoscopy were discussed with the patient and he consents to proceed.   CC:   Ardith Dark, MD

## 2022-12-24 NOTE — Patient Instructions (Signed)
You will be contacted by our office prior to your procedure for directions on holding your Xarelto.  If you do not hear from our office 1 week prior to your scheduled procedure, please call [phone] to discuss.   You have been scheduled for an endoscopy and colonoscopy. Please follow the written instructions given to you at your visit today.  Please pick up your prep supplies at the pharmacy within the next 1-3 days.  If you use inhalers (even only as needed), please bring them with you on the day of your procedure.  DO NOT TAKE 7 DAYS PRIOR TO TEST- Trulicity (dulaglutide) Ozempic, Wegovy (semaglutide) Mounjaro (tirzepatide) Bydureon Bcise (exanatide extended release)  DO NOT TAKE 1 DAY PRIOR TO YOUR TEST Rybelsus (semaglutide) Adlyxin (lixisenatide) Victoza (liraglutide) Byetta (exanatide)  _______________________________________________________  If your blood pressure at your visit was 140/90 or greater, please contact your primary care physician to follow up on this.  _______________________________________________________  If you are age 30 or older, your body mass index should be between 23-30. Your Body mass index is 20.09 kg/m. If this is out of the aforementioned range listed, please consider follow up with your Primary Care Provider.  If you are age 14 or younger, your body mass index should be between 19-25. Your Body mass index is 20.09 kg/m. If this is out of the aformentioned range listed, please consider follow up with your Primary Care Provider.   ________________________________________________________  The [facility] providers would like to encourage you to use [facility] to communicate with providers for non-urgent requests or questions.  Due to long hold times on the telephone, sending your provider a message by [facility] may be a faster and more efficient way to get a response.  Please allow 48 business hours for a response.  Please remember that this is for  non-urgent requests.  _______________________________________________________

## 2022-12-26 ENCOUNTER — Ambulatory Visit (INDEPENDENT_AMBULATORY_CARE_PROVIDER_SITE_OTHER): Payer: Medicare Other | Admitting: Family Medicine

## 2022-12-26 ENCOUNTER — Encounter: Payer: Self-pay | Admitting: Family Medicine

## 2022-12-26 VITALS — BP 103/69 | HR 81 | Temp 97.7°F | Ht 70.0 in | Wt 139.6 lb

## 2022-12-26 DIAGNOSIS — M25522 Pain in left elbow: Secondary | ICD-10-CM

## 2022-12-26 DIAGNOSIS — F411 Generalized anxiety disorder: Secondary | ICD-10-CM | POA: Diagnosis not present

## 2022-12-26 DIAGNOSIS — Z23 Encounter for immunization: Secondary | ICD-10-CM

## 2022-12-26 DIAGNOSIS — M5136 Other intervertebral disc degeneration, lumbar region: Secondary | ICD-10-CM

## 2022-12-26 DIAGNOSIS — G8929 Other chronic pain: Secondary | ICD-10-CM | POA: Diagnosis not present

## 2022-12-26 DIAGNOSIS — M549 Dorsalgia, unspecified: Secondary | ICD-10-CM

## 2022-12-26 DIAGNOSIS — F320 Major depressive disorder, single episode, mild: Secondary | ICD-10-CM | POA: Diagnosis not present

## 2022-12-26 MED ORDER — VENLAFAXINE HCL ER 37.5 MG PO CP24: 37.5000 mg | ORAL_CAPSULE | Freq: Every day | ORAL | 3 refills | Status: DC

## 2022-12-26 MED ORDER — KETOROLAC TROMETHAMINE 60 MG/2ML IM SOLN
60.0000 mg | Freq: Once | INTRAMUSCULAR | Status: AC
Start: 2022-12-26 — End: 2022-12-26
  Administered 2022-12-26: 60 mg via INTRAMUSCULAR

## 2022-12-26 MED ORDER — METHYLPREDNISOLONE ACETATE 80 MG/ML IJ SUSP
80.0000 mg | Freq: Once | INTRAMUSCULAR | Status: AC
Start: 2022-12-26 — End: 2022-12-26
  Administered 2022-12-26: 80 mg via INTRAMUSCULAR

## 2022-12-26 NOTE — Patient Instructions (Addendum)
It was very nice to see you today!  We will give you an injection of Toradol and depomedrol today.  I will send you over to see the sports medicine doctor for your elbow. Please work on the exercises.   We will start Effexor. Send me a message in a few weeks to let me know how this is working for you. I will refer you to a different psychiatrist   Return if symptoms worsen or fail to improve.   Take care, Dr Jimmey Ralph  PLEASE NOTE:  If you had any lab tests, please let us know if you have not heard back within a few days. You may see your results on mychart before we have a chance to review them but we will give you a call once they are reviewed by [place].   If we ordered any referrals today, please let us know if you have not heard from their office within the next week.   If you had any urgent prescriptions sent in today, please check with the pharmacy within an hour of our visit to make sure the prescription was transmitted appropriately.   Please try these tips to maintain a healthy lifestyle:  Eat at least 3 REAL meals and 1-2 snacks per day.  Aim for no more than 5 hours between eating.  If you eat breakfast, please do so within one hour of getting up.   Each meal should contain half fruits/vegetables, one quarter protein, and one quarter carbs (no bigger than a computer mouse)  Cut down on sweet beverages. This includes juice, soda, and sweet tea.   Drink at least 1 glass of water with each meal and aim for at least 8 glasses per day  Exercise at least 150 minutes every week.

## 2022-12-26 NOTE — Assessment & Plan Note (Signed)
Follows with psychiatry for this.  They are prescribing his Ativan.  Will be starting Effexor as above for his depression and chronic back pain.  Advised him to follow-up with psychiatry soon.  Will be placing referral to do provider that will take his insurance today as well.

## 2022-12-26 NOTE — Assessment & Plan Note (Addendum)
Following with neurosurgery for this.  No red flag signs or symptoms.  Reassuring exam today.  Has history of compression fractures as well as degenerative changes.  He recently had ESI's with modest improvement though still having daily back pain symptoms.  We are avoiding NSAIDs at this point due to him being anticoagulated on Xarelto.  Due to recent flareup we will give him repeat injection of Toradol and Depo-Medrol.  He has done well with these in the past.  He would like to have these repeated today.  He can continue using his muscle relaxers at home.  He will follow-up with neurosurgery as previously planned.

## 2022-12-26 NOTE — Progress Notes (Signed)
David Armstrong is a 72 y.o. male who presents today for an office visit.  Assessment/Plan:  New/Acute Problems: Left Elbow Pain Potentially medial epicondylitis.  May have some underlying osteoarthritis as well.  Cannot take oral NSAIDs due to being on anticoagulation.  Will give injection of Toradol and Depo-Medrol today as this has worked well for his back pain that we are treating below as well.  Hopefully this will help some with his elbow pain.  We discussed home exercises and handout was given.  Will refer to sports medicine for further management if not improving with above.  We discussed reasons to return to care or seek emergent care.  Chronic Problems Addressed Today: Chronic back pain Following with neurosurgery for this.  No red flag signs or symptoms.  Reassuring exam today.  Has history of compression fractures as well as degenerative changes.  He recently had ESI's with modest improvement though still having daily back pain symptoms.  We are avoiding NSAIDs at this point due to him being anticoagulated on Xarelto.  Due to recent flareup we will give him repeat injection of Toradol and Depo-Medrol.  He has done well with these in the past.  He would like to have these repeated today.  He can continue using his muscle relaxers at home.  He will follow-up with neurosurgery as previously planned.  Depression, major, single episode, mild (HCC) Follows with psychiatry for this.  Mildly elevated PHQ score today.  We did discuss treatment for psychiatry and advised him to follow-up with psychiatry soon to discuss ongoing management.  Patient stated it was difficult for him to see them routinely and additionally he was paying out-of-pocket for cost and he would like for [place] to initiate management if possible.  He is interested in seeing a different provider that will take his insurance.  Will place referral today.  Discussed with patient that it would be ideal for them to initiate management  however it would be reasonable for [place] to start Effexor as he has done recently well with this in the past.  Will start low-dose 37.5 mg daily.  He is aware of potential side effects.  He will follow-up with [place] in a few weeks if he is not able to see the psychiatrist by then.  Hopefully this will help with his chronic pain symptoms as well.    Anxiety state Follows with psychiatry for this.  They are prescribing his Ativan.  Will be starting Effexor as above for his depression and chronic back pain.  Advised him to follow-up with psychiatry soon.  Will be placing referral to do provider that will take his insurance today as well.     Subjective:  HPI:  See Assessment / plan for status of chronic conditions.  Main concern today is pain. HE does have chronic back pain and has been following with neurosurgery for this. Did recently have ESI with some improvement.  He does like his mood has been down.  He attributes this mostly to the pain that he has been experiencing in his back.  So far he is dealing with daily pain.  Does have tramadol use occasionally as needed however does not use this very frequently.  He will follow-up again with his back doctor in a couple of months.  He is interested in possibly restarting Effexor for mood as well as to help with pain management.  He does a few years ago.  Ultimately had to stop due to insomnia but does  feel like it did well to manage his mood.  Also some pain in his left elbow.  This has been going on for a couple months. On the middle part of his elbow. Has tried some OTC creams with some improvement. No other treatments tried. No obvious injuries or precipitating events. Worse with lifting and moving objects. Pain has been consistent the last few weeks         Objective:  Physical Exam: BP 103/69   Pulse 81   Temp 97.7 F (36.5 C) (Temporal)   Ht 5\' 10"  (1.778 m)   Wt 139 lb 9.6 oz (63.3 kg)   SpO2 99%   BMI 20.03 kg/m   Gen: No acute distress,  resting comfortably MUSCULOSKELETAL: - Left Elbow: No deformities.  Tenderness palpation along medial epicondyle.  Pain worsened with resisted wrist flexion and supination. Neuro: Grossly normal, moves all extremities Psych: Normal affect and thought content  Time Spent: 40 minutes of total time was spent on the date of the encounter performing the following actions: chart review prior to seeing the patient, obtaining history, performing a medically necessary exam, counseling on the treatment plan, placing orders, and documenting in our EHR.        Katina Degree. Jimmey Ralph, MD 12/26/2022 11:49 AM

## 2022-12-26 NOTE — Assessment & Plan Note (Addendum)
Follows with psychiatry for this.  Mildly elevated PHQ score today.  We did discuss treatment for psychiatry and advised him to follow-up with psychiatry soon to discuss ongoing management.  Patient stated it was difficult for him to see them routinely and additionally he was paying out-of-pocket for cost and he would like for [place] to initiate management if possible.  He is interested in seeing a different provider that will take his insurance.  Will place referral today.  Discussed with patient that it would be ideal for them to initiate management however it would be reasonable for [place] to start Effexor as he has done recently well with this in the past.  Will start low-dose 37.5 mg daily.  He is aware of potential side effects.  He will follow-up with [place] in a few weeks if he is not able to see the psychiatrist by then.  Hopefully this will help with his chronic pain symptoms as well.

## 2022-12-29 NOTE — Progress Notes (Signed)
Addendum: Reviewed and agree with assessment and management plan. Kadijah Shamoon M, MD  

## 2022-12-31 ENCOUNTER — Ambulatory Visit
Admission: RE | Admit: 2022-12-31 | Discharge: 2022-12-31 | Disposition: A | Discharge status: Home / Self Care | Payer: Medicare Other | Source: Ambulatory Visit | Attending: Pain Medicine | Admitting: Pain Medicine

## 2022-12-31 DIAGNOSIS — M5136 Other intervertebral disc degeneration, lumbar region: Secondary | ICD-10-CM | POA: Diagnosis not present

## 2022-12-31 DIAGNOSIS — M48061 Spinal stenosis, lumbar region without neurogenic claudication: Secondary | ICD-10-CM | POA: Diagnosis not present

## 2022-12-31 DIAGNOSIS — M545 Low back pain, unspecified: Secondary | ICD-10-CM

## 2023-01-01 NOTE — Progress Notes (Signed)
I, David Armstrong, CMA acting as a scribe for David Graham, MD.  David Armstrong is a 72 y.o. male who presents to [facility] today for bilat elbow pain. Pt was previously seen by Dr. Jean Armstrong for a T12 vertebral fx.   Today, pt c/o bilat elbow pain x 1-2 months. Pt locates pain to bilat elbow, L>R. Locates pain to medial aspect of both elbows. Hx of fracture of left elbow. Has responded well to steroid injections in the past. No NSAID's d/t blood thinner.   Radiates: into the lower arms Paresthesia:no Grip strength: no Aggravates: flexion/ extension Treatments tried: steroid, Toradol   He notes elbow pain and bilateral hand pain.  He notes his hands are stiff and sore in the morning especially around the base of the thumb. 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  History of neuropathy.   Exam:  BP 102/60   Pulse 69   Ht 5\' 10"  (1.778 m)   Wt 140 lb (63.5 kg)   SpO2 100%   BMI 20.09 kg/m  General: Well Developed, well nourished, and in no acute distress.   MSK: Elbows bilaterally normal-appearing Left elbow mildly tender palpation at medial epicondyle.  Normal elbow motion. Intact elbow strength. Pain present at especially left medial elbow with resisted wrist flexion.  Hands bilaterally bossing at first [facility].  Mildly tender palpation in this region.  Normal hand motion and grip strength.    Lab and Radiology Results  Procedure: Real-time Ultrasound Guided Injection of right elbow medial epicondyle common flexor tendon origin Device: Philips Affiniti 50G/GE Logiq Images permanently stored and available for review in PACS Calcific change present at medial epicondyle common flexor tendon origin consistent with chronic calcific tendinitis Verbal informed consent obtained.  Discussed risks and benefits of procedure. Warned about infection, bleeding, hyperglycemia  damage to structures among others. Patient expresses understanding and agreement Time-out conducted.   Noted no overlying erythema, induration, or other signs of local infection.   Skin prepped in a sterile fashion.   Local anesthesia: Topical Ethyl chloride.   With sterile technique and under real time ultrasound guidance: 40 mg of Depo-Medrol and 1 mL of lidocaine injected into the flexor tendon origin. Fluid seen entering the medial epicondyle.   Completed without difficulty   Pain immediately resolved suggesting accurate placement of the medication.   Advised to call if fevers/chills, erythema, induration, drainage, or persistent bleeding.   Images permanently stored and available for review in the ultrasound unit.  Impression: Technically successful ultrasound guided injection.   X-ray images bilateral elbows and bilateral hands obtained today personally and independently interpreted.  Right elbow: Moderate spurring at ulna at medial elbow with a prominent appearing medial epicondyle.  No acute fractures are visible.    Left elbow: Minimal spurring at ulna articulation and medial elbow.  No acute fractures are visible.  Prominent medial epicondyle is visible.  Right hand: DJD especially at the first David Armstrong and the STT joint radial wrist.  No acute fractures are visible.  Left hand: DJD especially at first David Armstrong and STT joint.  No acute fractures are visible.  Await formal radiology review      Assessment and Plan: 72 y.o. male with bilateral elbow pain left worse than right.  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  Plan for steroid injection in this region.  He does have more mild old  medial epicondylitis on the right and evidence of hand osteoarthritis bilaterally.  The hand OA I believe is causing majority of his pain.  He is a good candidate for trial of occupational therapy.  Plan refer to OT.  Recheck in 1 month.   PDMP not reviewed  this encounter. Orders Placed This Encounter  Procedures   [place] LIMITED JOINT SPACE STRUCTURES UP BILAT(NO LINKED CHARGES)    Order Specific Question:   Reason for Exam (SYMPTOM  OR DIAGNOSIS REQUIRED)    Answer:   bilat elbow pain    Order Specific Question:   Preferred imaging location?    Answer:   [facility] Armstrong Hand Complete Right    Standing Status:   Future    Number of Occurrences:   1    Standing Expiration Date:   02/01/2023    Order Specific Question:   Reason for Exam (SYMPTOM  OR DIAGNOSIS REQUIRED)    Answer:   right hand pain    Order Specific Question:   Preferred imaging location?    Answer:   [facility]   DG Hand Complete Left    Standing Status:   Future    Number of Occurrences:   1    Standing Expiration Date:   02/01/2023    Order Specific Question:   Reason for Exam (SYMPTOM  OR DIAGNOSIS REQUIRED)    Answer:   left hand pain    Order Specific Question:   Preferred imaging location?    Answer:   [facility]   DG ELBOW COMPLETE RIGHT (3+VIEW)    Standing Status:   Future    Number of Occurrences:   1    Standing Expiration Date:   02/01/2023    Order Specific Question:   Reason for Exam (SYMPTOM  OR DIAGNOSIS REQUIRED)    Answer:   right elbow pain    Order Specific Question:   Preferred imaging location?    Answer:   [facility]   DG ELBOW COMPLETE LEFT (3+VIEW)    Standing Status:   Future    Number of Occurrences:   1    Standing Expiration Date:   02/01/2023    Order Specific Question:   Reason for Exam (SYMPTOM  OR DIAGNOSIS REQUIRED)    Answer:   left elbow pain    Order Specific Question:   Preferred imaging location?    Answer:   [facility]   Ambulatory referral to Occupational Therapy    Referral Priority:   Routine    Referral Type:   Occupational Therapy    Referral Reason:   Specialty Services Required    Requested Specialty:   Occupational Therapy    Number of Visits  Requested:   1   Meds ordered this encounter  Medications   methylPREDNISolone acetate (DEPO-MEDROL) injection 40 mg     Discussed warning signs or symptoms. Please see discharge instructions. Patient expresses understanding.   The above documentation has been reviewed and is accurate and complete David Armstrong, M.D.

## 2023-01-02 ENCOUNTER — Ambulatory Visit (INDEPENDENT_AMBULATORY_CARE_PROVIDER_SITE_OTHER): Payer: Medicare Other | Admitting: Family Medicine

## 2023-01-02 ENCOUNTER — Telehealth: Payer: Self-pay | Admitting: *Deleted

## 2023-01-02 ENCOUNTER — Ambulatory Visit (INDEPENDENT_AMBULATORY_CARE_PROVIDER_SITE_OTHER): Payer: Medicare Other

## 2023-01-02 ENCOUNTER — Other Ambulatory Visit: Payer: Self-pay

## 2023-01-02 ENCOUNTER — Encounter: Payer: Self-pay | Admitting: Family Medicine

## 2023-01-02 VITALS — BP 102/60 | HR 69 | Ht 70.0 in | Wt 140.0 lb

## 2023-01-02 DIAGNOSIS — M25521 Pain in right elbow: Secondary | ICD-10-CM

## 2023-01-02 DIAGNOSIS — M79641 Pain in right hand: Secondary | ICD-10-CM

## 2023-01-02 DIAGNOSIS — M79642 Pain in left hand: Secondary | ICD-10-CM

## 2023-01-02 DIAGNOSIS — M25522 Pain in left elbow: Secondary | ICD-10-CM | POA: Diagnosis not present

## 2023-01-02 DIAGNOSIS — M7989 Other specified soft tissue disorders: Secondary | ICD-10-CM | POA: Diagnosis not present

## 2023-01-02 DIAGNOSIS — M7701 Medial epicondylitis, right elbow: Secondary | ICD-10-CM | POA: Diagnosis not present

## 2023-01-02 DIAGNOSIS — M19041 Primary osteoarthritis, right hand: Secondary | ICD-10-CM | POA: Diagnosis not present

## 2023-01-02 DIAGNOSIS — M19042 Primary osteoarthritis, left hand: Secondary | ICD-10-CM | POA: Diagnosis not present

## 2023-01-02 MED ORDER — METHYLPREDNISOLONE ACETATE 40 MG/ML IJ SUSP
40.0000 mg | Freq: Once | INTRAMUSCULAR | Status: AC
Start: 2023-01-02 — End: 2023-01-02
  Administered 2023-01-02: 40 mg via INTRA_ARTICULAR

## 2023-01-02 NOTE — Telephone Encounter (Signed)
David Armstrong 1951/02/12 [id number]  Dear Dr. Jimmey Ralph:  We have scheduled the above named patient for a(n) EGD/colonoscopy procedure. Our records show that (s)he is on anticoagulation therapy.  Please advise as to whether the patient may come off their therapy of Xarelto 1 days prior to their procedure which is scheduled for 02/24/2023.  Please route your response to Cristela Felt, CMA or fax response to [phone].  Sincerely,   Cristela Felt, [facility]

## 2023-01-02 NOTE — Patient Instructions (Signed)
Thank you for coming in today.  Please get an Xray today before you leave  A referral has been placed for therapy for your hands and elbows with [facility], they should reach out within the next week.

## 2023-01-05 NOTE — Telephone Encounter (Signed)
Ok to hold for 1 day.  David Armstrong. Jimmey Ralph, MD 01/05/2023 7:28 AM

## 2023-01-05 NOTE — Telephone Encounter (Signed)
Ok to hold for 1 day.  Katina Degree. Jimmey Ralph, MD 01/05/2023 7:28 AM

## 2023-01-07 NOTE — Progress Notes (Signed)
Right hand x-ray shows some mild arthritis.

## 2023-01-07 NOTE — Progress Notes (Signed)
Left elbow x-ray shows some swelling at the tip of the elbow but otherwise it looks okay.  No severe arthritis.  No fractures.

## 2023-01-07 NOTE — Progress Notes (Signed)
Left hand x-ray shows some mild arthritis

## 2023-01-07 NOTE — Progress Notes (Signed)
Right elbow x-ray looks okay to radiology.  No severe arthritis.  No fractures.

## 2023-01-21 NOTE — Telephone Encounter (Signed)
Patient informed to hold Xarelto.

## 2023-01-21 NOTE — Telephone Encounter (Signed)
Left message for patient to call office.  

## 2023-02-05 DIAGNOSIS — M9904 Segmental and somatic dysfunction of sacral region: Secondary | ICD-10-CM | POA: Diagnosis not present

## 2023-02-05 DIAGNOSIS — M9903 Segmental and somatic dysfunction of lumbar region: Secondary | ICD-10-CM | POA: Diagnosis not present

## 2023-02-05 DIAGNOSIS — M5431 Sciatica, right side: Secondary | ICD-10-CM | POA: Diagnosis not present

## 2023-02-05 DIAGNOSIS — M9905 Segmental and somatic dysfunction of pelvic region: Secondary | ICD-10-CM | POA: Diagnosis not present

## 2023-02-06 ENCOUNTER — Ambulatory Visit: Payer: Medicare Other | Admitting: Family Medicine

## 2023-02-09 DIAGNOSIS — M9903 Segmental and somatic dysfunction of lumbar region: Secondary | ICD-10-CM | POA: Diagnosis not present

## 2023-02-09 DIAGNOSIS — M9905 Segmental and somatic dysfunction of pelvic region: Secondary | ICD-10-CM | POA: Diagnosis not present

## 2023-02-09 DIAGNOSIS — M62830 Muscle spasm of back: Secondary | ICD-10-CM | POA: Diagnosis not present

## 2023-02-09 DIAGNOSIS — M9904 Segmental and somatic dysfunction of sacral region: Secondary | ICD-10-CM | POA: Diagnosis not present

## 2023-02-11 DIAGNOSIS — M5431 Sciatica, right side: Secondary | ICD-10-CM | POA: Diagnosis not present

## 2023-02-11 DIAGNOSIS — M9903 Segmental and somatic dysfunction of lumbar region: Secondary | ICD-10-CM | POA: Diagnosis not present

## 2023-02-11 DIAGNOSIS — M9904 Segmental and somatic dysfunction of sacral region: Secondary | ICD-10-CM | POA: Diagnosis not present

## 2023-02-11 DIAGNOSIS — M9905 Segmental and somatic dysfunction of pelvic region: Secondary | ICD-10-CM | POA: Diagnosis not present

## 2023-02-24 ENCOUNTER — Ambulatory Visit (AMBULATORY_SURGERY_CENTER): Payer: Medicare Other | Admitting: Internal Medicine

## 2023-02-24 ENCOUNTER — Encounter: Payer: Self-pay | Admitting: Internal Medicine

## 2023-02-24 VITALS — BP 117/84 | HR 79 | Temp 98.1°F | Resp 12 | Ht 70.0 in | Wt 140.0 lb

## 2023-02-24 DIAGNOSIS — F419 Anxiety disorder, unspecified: Secondary | ICD-10-CM | POA: Diagnosis not present

## 2023-02-24 DIAGNOSIS — R1310 Dysphagia, unspecified: Secondary | ICD-10-CM

## 2023-02-24 DIAGNOSIS — D122 Benign neoplasm of ascending colon: Secondary | ICD-10-CM | POA: Diagnosis not present

## 2023-02-24 DIAGNOSIS — K635 Polyp of colon: Secondary | ICD-10-CM | POA: Diagnosis not present

## 2023-02-24 DIAGNOSIS — Z1211 Encounter for screening for malignant neoplasm of colon: Secondary | ICD-10-CM

## 2023-02-24 DIAGNOSIS — D123 Benign neoplasm of transverse colon: Secondary | ICD-10-CM | POA: Diagnosis not present

## 2023-02-24 DIAGNOSIS — K222 Esophageal obstruction: Secondary | ICD-10-CM | POA: Diagnosis not present

## 2023-02-24 DIAGNOSIS — K259 Gastric ulcer, unspecified as acute or chronic, without hemorrhage or perforation: Secondary | ICD-10-CM | POA: Diagnosis not present

## 2023-02-24 DIAGNOSIS — K219 Gastro-esophageal reflux disease without esophagitis: Secondary | ICD-10-CM

## 2023-02-24 DIAGNOSIS — K2950 Unspecified chronic gastritis without bleeding: Secondary | ICD-10-CM | POA: Diagnosis not present

## 2023-02-24 DIAGNOSIS — D124 Benign neoplasm of descending colon: Secondary | ICD-10-CM

## 2023-02-24 DIAGNOSIS — F32A Depression, unspecified: Secondary | ICD-10-CM | POA: Diagnosis not present

## 2023-02-24 MED ORDER — OMEPRAZOLE 40 MG PO CPDR: 40.0000 mg | DELAYED_RELEASE_CAPSULE | Freq: Every day | ORAL | 1 refills | Status: DC

## 2023-02-24 MED ORDER — SODIUM CHLORIDE 0.9 % IV SOLN: 500.0000 mL | INTRAVENOUS | Status: DC

## 2023-02-24 NOTE — Progress Notes (Signed)
VS by DT  Pt's states no medical or surgical changes since previsit or office visit.  

## 2023-02-24 NOTE — Progress Notes (Signed)
GASTROENTEROLOGY PROCEDURE H&P NOTE   Primary Care Physician: Ardith Dark, MD    Reason for Procedure:  GERD dysphagia and colon cancer screening  Plan:    EGD and colonoscopy  Patient is appropriate for endoscopic procedure(s) in the ambulatory ([facility]) setting.  The nature of the procedure, as well as the risks, benefits, and alternatives were carefully and thoroughly reviewed with the patient. Ample time for discussion and questions allowed. The patient understood, was satisfied, and agreed to proceed.     HPI: David Armstrong is a 72 y.o. male who presents for EGD and colonoscopy notable.  Medical history as below.  Tolerated the prep.  No recent chest pain or shortness of breath.  No abdominal pain today.  Xarelto on hold x 2 days  Past Medical History:  Diagnosis Date   Anxiety    Arthritis    "lower back; hips;' right knee"    Bipolar disorder (HCC)    Chronic constipation    Depression    Diverticulosis of colon    Edema of both lower extremities    ANKLES   Fatty liver    GERD (gastroesophageal reflux disease)    Hiatal hernia    History of anal fissures    History of deep vein thrombosis (DVT) of lower extremity 02/11/2016   right lower extremity distal and proximal extensive acute   History of Helicobacter pylori infection 2012; 2009   History of kidney stones    History of pneumonia 01/2015   CAP   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    Hyperlipidemia    IBS (irritable bowel syndrome)    Left ureteral stone    Nocturia    Peripheral neuropathy    per pt due to back DDD   Personal history of colonic polyps    HYPERPLASTIC POLYP   Pulmonary nodule, right    incidental finding CT chest angio 02-11-2016 stable   TMJ (temporomandibular joint syndrome)    Varicosities of leg     Past Surgical History:  Procedure Laterality Date   BELPHAROPTOSIS REPAIR Bilateral 01/2015   CATARACT EXTRACTION W/ INTRAOCULAR LENS   IMPLANT, BILATERAL  04/2016   COLONOSCOPY WITH ESOPHAGOGASTRODUODENOSCOPY (EGD)  last one 03-02-2013   CYSTOSCOPY/RETROGRADE/URETEROSCOPY/STONE EXTRACTION WITH BASKET  2010   CYSTOSCOPY/URETEROSCOPY/HOLMIUM LASER/STENT PLACEMENT Left 03/16/2017   Procedure: CYSTOSCOPY/RETROGRADE/URETEROSCOPY/HOLMIUM LASER/STENT PLACEMENT, STONE BASKET EXTRACTION AND STENT PLACEMENT;  Surgeon: Ihor Gully, MD;  Location: [facility];  Service: Urology;  Laterality: Left;   EXCISIONAL HEMORRHOIDECTOMY  2015 approx.   EXTRACORPOREAL SHOCK WAVE LITHOTRIPSY  1987; 1992   EXTRACORPOREAL SHOCK WAVE LITHOTRIPSY Left 09/05/2022   Procedure: LEFT EXTRACORPOREAL SHOCK WAVE LITHOTRIPSY (ESWL);  Surgeon: Jannifer Hick, MD;  Location: [facility];  Service: Urology;  Laterality: Left;   EXTRACORPOREAL SHOCK WAVE LITHOTRIPSY Right 09/22/2022   Procedure: RIGHT EXTRACORPOREAL SHOCK WAVE LITHOTRIPSY (ESWL);  Surgeon: Marcine Matar, MD;  Location: [facility];  Service: Urology;  Laterality: Right;   EXTRACORPOREAL SHOCK WAVE LITHOTRIPSY Left 11/24/2022   Procedure: LEFT EXTRACORPOREAL SHOCK WAVE LITHOTRIPSY (ESWL);  Surgeon: Rene Paci, MD;  Location[facility];  Service: Urology;  Laterality: Left;   FOOT NEUROMA SURGERY Bilateral left 09/2015; right 04/ 2017   morton' neuroma   FOOT NEUROMA SURGERY Right 2004   morton's neuroma and removal heel spur   HAMMER TOE SURGERY Left 2013   5th toe   INCISION AND DRAINAGE FOOT Left 1992   "  ball of foot; stepped on piece of hard wire & it went thru my shoe"   IR ANGIOGRAM PULMONARY BILATERAL SELECTIVE  12/24/2021   IR ANGIOGRAM SELECTIVE EACH ADDITIONAL VESSEL  12/24/2021   IR ANGIOGRAM SELECTIVE EACH ADDITIONAL VESSEL  12/24/2021   IR INFUSION THROMBOL ARTERIAL INITIAL (MS)  12/24/2021   IR INFUSION THROMBOL ARTERIAL INITIAL (MS)  12/24/2021   IR THROMB F/U EVAL ART/VEN FINAL DAY (MS)  12/25/2021   IR US  GUIDE VASC ACCESS RIGHT  12/24/2021   KNEE ARTHROSCOPY Bilateral left x2 1997;  right 2004 & 2005   NASAL FRACTURE SURGERY  1997   PATELLA-FEMORAL ARTHROPLASTY Right 07-06-2003   dr Renae Fickle [facility]   PLANTAR FASCIA RELEASE Bilateral 1987 & 1993   RHINOPLASTY  2000   SHOULDER ARTHROSCOPY Left 2012;  2013   SHOULDER ARTHROSCOPY WITH DISTAL CLAVICLE RESECTION Right 07-31-2005   dr Eulah Pont  [facility]   w/ Debridement labral and adhesions, acromioplasty, bursectomy, and CA ligament release   TRANSTHORACIC ECHOCARDIOGRAM  02/12/2016   ef 65-70%, grade 1 diastolic dysfunction/  trivial TR    Prior to Admission medications   Medication Sig Start Date End Date Taking? Authorizing Provider  hyoscyamine (LEVSIN SL) 0.125 MG SL tablet DISSOLVE 2 TABLETS UNDER THE TONGUE EVERY 6 HOURS AS NEEDED 06/17/22  Yes Ardith Dark, MD  LORazepam (ATIVAN) 1 MG tablet Take 0.5-1 tablets (0.5-1 mg total) by mouth every 8 (eight) hours as needed for anxiety. 05/26/17  Yes Ardith Dark, MD  traMADol (ULTRAM) 50 MG tablet Take 1 tablet (50 mg total) by mouth 3 (three) times daily as needed for moderate pain or severe pain. 07/24/22  Yes Ardith Dark, MD  baclofen (LIORESAL) 10 MG tablet Take 0.5-1 tablets (5-10 mg total) by mouth 3 (three) times daily. 07/24/22   Ardith Dark, MD  desonide (DESOWEN) 0.05 % cream Apply topically 2 (two) times daily. 10/02/22   Ardith Dark, MD  desonide (DESOWEN) 0.05 % lotion Apply topically 2 (two) times daily. 10/03/22   Ardith Dark, MD  docusate sodium (COLACE) 100 MG capsule Take 1 capsule (100 mg total) by mouth daily as needed for up to 30 doses. 09/05/22   Jannifer Hick, MD  fluticasone (FLONASE) 50 MCG/ACT nasal spray Place 2 sprays into both nostrils daily. USE twice a day for 1 week, then reduce to using daily. 12/21/21   Ardith Dark, MD  ketoconazole (NIZORAL) 2 % shampoo Apply 1 Application topically daily. 10/02/22   Ardith Dark, MD  omeprazole (PRILOSEC) 20 MG capsule  Take 1 capsule (20 mg total) by mouth daily. 12/20/21   Ardith Dark, MD  rivaroxaban (XARELTO) 20 MG TABS tablet Take 1 tablet (20 mg total) by mouth daily with supper. START this only after you have completed the starter pack. 01/26/22   Ghimire, Werner Lean, MD  senna-docusate (SENOKOT-S) 8.6-50 MG tablet Take 1 tablet by mouth at bedtime as needed for mild constipation. 05/25/22   Long, Arlyss Repress, MD  tiZANidine (ZANAFLEX) 4 MG tablet tizanidine 4 mg 06/13/21   [provider]  triamcinolone cream (KENALOG) 0.1 % Apply 1 Application topically 2 (two) times daily. 04/18/22   [provider]  venlafaxine XR (EFFEXOR XR) 37.5 MG 24 hr capsule Take 1 capsule (37.5 mg total) by mouth daily with breakfast. 12/26/22   Ardith Dark, MD  Vitamin D, Ergocalciferol, (DRISDOL) 1.25 MG (50000 UNIT) CAPS capsule Take 50,000 Units by mouth once a  week. 01/10/22   [provider]  XDEMVY 0.25 % SOLN Apply to eye. 05/20/22   [provider]    Current Outpatient Medications  Medication Sig Dispense Refill   hyoscyamine (LEVSIN SL) 0.125 MG SL tablet DISSOLVE 2 TABLETS UNDER THE TONGUE EVERY 6 HOURS AS NEEDED 720 tablet 0   LORazepam (ATIVAN) 1 MG tablet Take 0.5-1 tablets (0.5-1 mg total) by mouth every 8 (eight) hours as needed for anxiety. 90 tablet 0   traMADol (ULTRAM) 50 MG tablet Take 1 tablet (50 mg total) by mouth 3 (three) times daily as needed for moderate pain or severe pain. 30 tablet 0   baclofen (LIORESAL) 10 MG tablet Take 0.5-1 tablets (5-10 mg total) by mouth 3 (three) times daily. 30 each 0   desonide (DESOWEN) 0.05 % cream Apply topically 2 (two) times daily. 30 g 0   desonide (DESOWEN) 0.05 % lotion Apply topically 2 (two) times daily. 59 mL 0   docusate sodium (COLACE) 100 MG capsule Take 1 capsule (100 mg total) by mouth daily as needed for up to 30 doses. 30 capsule 0   fluticasone (FLONASE) 50 MCG/ACT nasal spray Place 2 sprays into both nostrils daily. USE  twice a day for 1 week, then reduce to using daily. 16 g 6   ketoconazole (NIZORAL) 2 % shampoo Apply 1 Application topically daily. 120 mL 0   omeprazole (PRILOSEC) 20 MG capsule Take 1 capsule (20 mg total) by mouth daily. 90 capsule 3   rivaroxaban (XARELTO) 20 MG TABS tablet Take 1 tablet (20 mg total) by mouth daily with supper. START this only after you have completed the starter pack. 90 tablet 1   senna-docusate (SENOKOT-S) 8.6-50 MG tablet Take 1 tablet by mouth at bedtime as needed for mild constipation. 20 tablet 0   tiZANidine (ZANAFLEX) 4 MG tablet tizanidine 4 mg     triamcinolone cream (KENALOG) 0.1 % Apply 1 Application topically 2 (two) times daily.     venlafaxine XR (EFFEXOR XR) 37.5 MG 24 hr capsule Take 1 capsule (37.5 mg total) by mouth daily with breakfast. 90 capsule 3   Vitamin D, Ergocalciferol, (DRISDOL) 1.25 MG (50000 UNIT) CAPS capsule Take 50,000 Units by mouth once a week.     XDEMVY 0.25 % SOLN Apply to eye.     Current Facility-Administered Medications  Medication Dose Route Frequency Provider Last Rate Last Admin   0.9 %  sodium chloride infusion  500 mL Intravenous Continuous Nyrie Sigal, Carie Caddy, MD        Allergies as of 02/24/2023 - Review Complete 02/24/2023  Allergen Reaction Noted   Effexor xr [venlafaxine hcl er] Other (See Comments) 05/26/2017   Acyclovir and related  03/16/2017   Zocor [simvastatin] Other (See Comments) 03/10/2017   Dexilant [dexlansoprazole] Other (See Comments) 11/29/2012   Ivp dye [iodinated contrast media] Rash 12/30/2010   Soma [carisoprodol] Other (See Comments) 11/22/2010   Sulfa antibiotics Rash 03/10/2017    Family History  Problem Relation Age of Onset   Heart disease Mother    Stroke Mother    Cirrhosis Father    Heart disease Father    Skin cancer Father    Heart disease Brother    Prostate cancer Maternal Grandfather    Parkinson's disease Paternal Grandfather    Colon cancer Neg Hx    Stomach cancer Neg Hx     Esophageal cancer Neg Hx    Rectal cancer Neg Hx     Social History  Socioeconomic History   Marital status: Divorced    Spouse name: Not on file   Number of children: 0   Years of education: Not on file   Highest education level: Not on file  Occupational History   Occupation: retired    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[place] [employer]    Comment: disabled now   Occupation: retired  Tobacco Use   Smoking status: Never   Smokeless tobacco: Never  Vaping Use   Vaping status: Never Used  Substance and Sexual Activity   Alcohol use: No    Alcohol/week: 0.0 standard drinks of alcohol   Drug use: No   Sexual activity: Never  Other Topics Concern   Not on file  Social History Narrative   Goes by "[name]"   Divorced   No children   Retired from the post office   Has internet girlfriend on facebook from [place]   Social Determinants of Health   Financial Resource Strain: Low Risk  (12/02/2021)   Overall Financial Resource Strain (CARDIA)    Difficulty of Paying Living Expenses: Not hard at all  Food Insecurity: Unknown (02/21/2022)   Hunger Vital Sign    Worried About Running Out of Food in the Last Year: Never true    Ran Out of Food in the Last Year: Not on file  Transportation Needs: No Transportation Needs (02/21/2022)   PRAPARE - Administrator, Civil Service (Medical): No    Lack of Transportation (Non-Medical): No  Physical Activity: Insufficiently Active (11/19/2020)   Exercise Vital Sign    Days of Exercise per Week: 4 days    Minutes of Exercise per Session: 30 min  Stress: No Stress Concern Present (12/02/2021)   [employer] of Occupational Health - Occupational Stress Questionnaire    Feeling of Stress : Only a little  Social Connections: Socially Isolated (12/02/2021)   Social Connection and Isolation Panel [NHANES]    Frequency of Communication with Friends and Family: Three times a week    Frequency of Social Gatherings with Friends and Family: Never    Attends  Religious Services: Never    Database administrator or Organizations: No    Attends Banker Meetings: Never    Marital Status: Divorced  Catering manager Violence: Not At Risk (12/02/2021)   Humiliation, Afraid, Rape, and Kick questionnaire    Fear of Current or Ex-Partner: No    Emotionally Abused: No    Physically Abused: No    Sexually Abused: No    Physical Exam: Vital signs in last 24 hours: @BP  100/79   Pulse 82   Temp 98.1 F (36.7 C)   Ht 5\' 10"  (1.778 m)   Wt 140 lb (63.5 kg)   SpO2 98%   BMI 20.09 kg/m  GEN: NAD EYE: Sclerae anicteric ENT: MMM CV: Non-tachycardic Pulm: CTA b/l GI: Soft, NT/ND NEURO:  Alert & Oriented x 3   Erick Blinks, MD [facility]  02/24/2023 2:08 PM

## 2023-02-24 NOTE — Patient Instructions (Addendum)
Educational handout provided to patient related to Hemorrhoids, Polyps, and Diverticulosis  FOLLOW POST DILATION DIET  Increase Omeprazole to 40mg  once daily.  RESUME XARELTO at prior dose TOMORROW.  Awaiting pathology results  YOU HAD AN ENDOSCOPIC PROCEDURE TODAY AT THE [facility]:   Refer to the procedure report that was given to you for any specific questions about what was found during the examination.  If the procedure report does not answer your questions, please call your gastroenterologist to clarify.  If you requested that your care partner not be given the details of your procedure findings, then the procedure report has been included in a sealed envelope for you to review at your convenience later.  YOU SHOULD EXPECT: Some feelings of bloating in the abdomen. Passage of more gas than usual.  Walking can help get rid of the air that was put into your GI tract during the procedure and reduce the bloating. If you had a lower endoscopy (such as a colonoscopy or flexible sigmoidoscopy) you may notice spotting of blood in your stool or on the toilet paper. If you underwent a bowel prep for your procedure, you may not have a normal bowel movement for a few days.  Please Note:  You might notice some irritation and congestion in your nose or some drainage.  This is from the oxygen used during your procedure.  There is no need for concern and it should clear up in a day or so.  SYMPTOMS TO REPORT IMMEDIATELY:  Following lower endoscopy (colonoscopy or flexible sigmoidoscopy):  Excessive amounts of blood in the stool  Significant tenderness or worsening of abdominal pains  Swelling of the abdomen that is new, acute  Fever of 100F or higher  Following upper endoscopy (EGD)  Vomiting of blood or coffee ground material  New chest pain or pain under the shoulder blades  Painful or persistently difficult swallowing  New shortness of breath  Fever of 100F or higher  Black,  tarry-looking stools  For urgent or emergent issues, a gastroenterologist can be reached at any hour by calling [phone]. Do not use MyChart messaging for urgent concerns.    DIET:  We do recommend a small meal at first, but then you may proceed to your regular diet.  Drink plenty of fluids but you should avoid alcoholic beverages for 24 hours.  ACTIVITY:  You should plan to take it easy for the rest of today and you should NOT DRIVE or use heavy machinery until tomorrow (because of the sedation medicines used during the test).    FOLLOW UP: Our staff will call the number listed on your records the next business day following your procedure.  We will call around 7:15- 8:00 am to check on you and address any questions or concerns that you may have regarding the information given to you following your procedure. If we do not reach you, we will leave a message.     If any biopsies were taken you will be contacted by phone or by letter within the next 1-3 weeks.  Please call us at [phone] if you have not heard about the biopsies in 3 weeks.    SIGNATURES/CONFIDENTIALITY: You and/or your care partner have signed paperwork which will be entered into your electronic medical record.  These signatures attest to the fact that that the information above on your After Visit Summary has been reviewed and is understood.  Full responsibility of the confidentiality of this discharge information lies with  you and/or your care-partner.

## 2023-02-24 NOTE — Progress Notes (Signed)
Report to PACU, RN, vss, BBS= Clear.  

## 2023-02-24 NOTE — Op Note (Signed)
[facility] Patient Name: David Armstrong Procedure Date: 02/24/2023 2:18 PM MRN: [id number] Endoscopist: Beverley Fiedler , MD, [id number] Age: 72 Referring MD:  Date of Birth: 1951/03/01 Gender: Male Account #: [id number] Procedure:                Colonoscopy Indications:              Screening for colorectal malignant neoplasm, Last                            colonoscopy 10 years ago Medicines:                Monitored Anesthesia Care Procedure:                Pre-Anesthesia Assessment:                           - Prior to the procedure, a History and Physical                            was performed, and patient medications and                            allergies were reviewed. The patient's tolerance of                            previous anesthesia was also reviewed. The risks                            and benefits of the procedure and the sedation                            options and risks were discussed with the patient.                            All questions were answered, and informed consent                            was obtained. Prior Anticoagulants: The patient has                            taken Xarelto (rivaroxaban), last dose was 2 days                            prior to procedure. ASA Grade Assessment: III - A                            patient with severe systemic disease. After                            reviewing the risks and benefits, the patient was                            deemed in satisfactory condition to undergo the  procedure.                           After obtaining informed consent, the colonoscope                            was passed under direct vision. Throughout the                            procedure, the patient's blood pressure, pulse, and                            oxygen saturations were monitored continuously. The                            Olympus Scope SN [id number] was introduced through the                             anus and advanced to the cecum, identified by                            appendiceal orifice and ileocecal valve. The                            colonoscopy was performed without difficulty. The                            patient tolerated the procedure well. The quality                            of the bowel preparation was good. The ileocecal                            valve, appendiceal orifice, and rectum were                            photographed. Scope In: 2:38:14 PM Scope Out: 2:54:29 PM Scope Withdrawal Time: 0 hours 14 minutes 17 seconds  Total Procedure Duration: 0 hours 16 minutes 15 seconds  Findings:                 The digital rectal exam was normal.                           There was a small lipoma, in the cecum.                           Two sessile polyps were found in the ascending                            colon. The polyps were 4 to 6 mm in size. These                            polyps were removed with a cold snare. Resection  and retrieval were complete.                           Three sessile polyps were found in the transverse                            colon. The polyps were 4 to 9 mm in size. These                            polyps were removed with a cold snare. Resection                            and retrieval were complete.                           Four sessile polyps were found in the descending                            colon. The polyps were 4 to 8 mm in size. These                            polyps were removed with a cold snare. Resection                            was complete, but the polyp tissue was only                            partially retrieved.                           Multiple medium-mouthed and small-mouthed                            diverticula were found in the sigmoid colon,                            descending colon and transverse colon.                           The retroflexed  view of the distal rectum and anal                            verge was normal and showed no anal or rectal                            abnormalities. Complications:            No immediate complications. Estimated Blood Loss:     Estimated blood loss was minimal. Impression:               - Small lipoma in the cecum.                           - Two 4 to 6 mm polyps in the ascending colon,  removed with a cold snare. Resected and retrieved.                           - Three 4 to 9 mm polyps in the transverse colon,                            removed with a cold snare. Resected and retrieved.                           - Four 4 to 8 mm polyps in the descending colon,                            removed with a cold snare. Complete resection.                            Partial retrieval (1 of 4 not retrieved).                           - Moderate diverticulosis in the sigmoid colon, in                            the descending colon and in the transverse colon.                           - The distal rectum and anal verge are normal on                            retroflexion view. Recommendation:           - Patient has a contact number available for                            emergencies. The signs and symptoms of potential                            delayed complications were discussed with the                            patient. Return to normal activities tomorrow.                            Written discharge instructions were provided to the                            patient.                           - Resume previous diet.                           - Continue present medications.                           - Resume Xarelto (rivaroxaban) at prior dose  tomorrow. Refer to managing physician for further                            adjustment of therapy.                           - Await pathology results.                           - Repeat  colonoscopy is recommended for                            surveillance. The colonoscopy date will be                            determined after pathology results from today's                            exam become available for review. Beverley Fiedler, MD 02/24/2023 3:04:47 PM This report has been signed electronically.

## 2023-02-24 NOTE — Progress Notes (Signed)
Called to room to assist during endoscopic procedure.  Patient ID and intended procedure confirmed with present staff. Received instructions for my participation in the procedure from the performing physician.  

## 2023-02-24 NOTE — Op Note (Signed)
[facility] Patient Name: David Armstrong Procedure Date: 02/24/2023 2:19 PM MRN: [id number] Endoscopist: Beverley Fiedler , MD, [id number] Age: 72 Referring MD:  Date of Birth: 01/30/1951 Gender: Male Account #: [id number] Procedure:                Upper GI endoscopy Indications:              Dysphagia, Gastro-esophageal reflux disease Medicines:                Monitored Anesthesia Care Procedure:                Pre-Anesthesia Assessment:                           - Prior to the procedure, a History and Physical                            was performed, and patient medications and                            allergies were reviewed. The patient's tolerance of                            previous anesthesia was also reviewed. The risks                            and benefits of the procedure and the sedation                            options and risks were discussed with the patient.                            All questions were answered, and informed consent                            was obtained. Prior Anticoagulants: The patient has                            taken Xarelto (rivaroxaban), last dose was 2 days                            prior to procedure. ASA Grade Assessment: III - A                            patient with severe systemic disease. After                            reviewing the risks and benefits, the patient was                            deemed in satisfactory condition to undergo the                            procedure.  After obtaining informed consent, the endoscope was                            passed under direct vision. Throughout the                            procedure, the patient's blood pressure, pulse, and                            oxygen saturations were monitored continuously. The                            Olympus Scope SN [id number] was introduced through the                            mouth, and advanced to the  second part of duodenum.                            The upper GI endoscopy was accomplished without                            difficulty. The patient tolerated the procedure                            well. Scope In: Scope Out: Findings:                 The lower third of the esophagus was significantly                            tortuous.                           One benign-appearing, intrinsic moderate                            (circumferential scarring or stenosis; an endoscope                            may pass) stenosis was found 35 cm from the                            incisors. This stenosis measured 1 cm (inner                            diameter) x 1 cm (in length). The stenosis was                            traversed. A TTS dilator was passed through the                            scope. Dilation with a 13.5-14.5-15.5 mm balloon                            dilator was performed to  15.5 mm. The dilation site                            was examined and showed moderate mucosal disruption.                           One ulcer was found in the cardia (immediately                            distal to the GE junction). Biopsies were taken                            with a cold forceps for histology.                           A 5 cm hiatal hernia was present.                           The gastroesophageal flap valve was visualized                            endoscopically and classified as Hill Grade IV (no                            fold, wide open lumen, hiatal hernia present).                           The exam of the stomach was otherwise normal.                           The examined duodenum was normal. Complications:            No immediate complications. Estimated Blood Loss:     Estimated blood loss was minimal. Impression:               - Tortuous esophagus.                           - Benign-appearing esophageal stenosis. Dilated to                            15.5 mm  with balloon.                           - Ulcer in gastric cardia immediately distal to GE                            junction and stenosis. Biopsied.                           - 5 cm hiatal hernia.                           - Normal examined duodenum. Recommendation:           - Patient has a contact number available for  emergencies. The signs and symptoms of potential                            delayed complications were discussed with the                            patient. Return to normal activities tomorrow.                            Written discharge instructions were provided to the                            patient.                           - Resume previous diet.                           - Continue present medications. Increase omeprazole                            to 40 mg once daily.                           - Await pathology results.                           - Repeat EGD in recommended in 8-12 weeks to ensure                            healing of ulceration and to consider additional                            dilation.                           - See the other procedure note for documentation of                            additional recommendations. Beverley Fiedler, MD 02/24/2023 3:01:34 PM This report has been signed electronically.

## 2023-02-25 ENCOUNTER — Telehealth: Payer: Self-pay

## 2023-02-25 NOTE — Telephone Encounter (Signed)
  Follow up Call-     02/24/2023    1:17 PM  Call back number  Post procedure Call Back phone  # [phone]  Permission to leave phone message Yes     Left message

## 2023-02-26 ENCOUNTER — Emergency Department (HOSPITAL_COMMUNITY): Payer: Medicare Other

## 2023-02-26 ENCOUNTER — Other Ambulatory Visit: Payer: Self-pay

## 2023-02-26 ENCOUNTER — Emergency Department (HOSPITAL_COMMUNITY)
Admission: EM | Admit: 2023-02-26 | Discharge: 2023-02-27 | Disposition: A | Discharge status: Home / Self Care | Payer: Medicare Other | Attending: Emergency Medicine | Admitting: Emergency Medicine

## 2023-02-26 ENCOUNTER — Encounter (HOSPITAL_COMMUNITY): Payer: Self-pay

## 2023-02-26 DIAGNOSIS — Z1152 Encounter for screening for COVID-19: Secondary | ICD-10-CM | POA: Insufficient documentation

## 2023-02-26 DIAGNOSIS — R55 Syncope and collapse: Secondary | ICD-10-CM | POA: Insufficient documentation

## 2023-02-26 DIAGNOSIS — R0602 Shortness of breath: Secondary | ICD-10-CM | POA: Diagnosis not present

## 2023-02-26 DIAGNOSIS — F319 Bipolar disorder, unspecified: Secondary | ICD-10-CM | POA: Insufficient documentation

## 2023-02-26 DIAGNOSIS — I6782 Cerebral ischemia: Secondary | ICD-10-CM | POA: Diagnosis not present

## 2023-02-26 DIAGNOSIS — Z7901 Long term (current) use of anticoagulants: Secondary | ICD-10-CM | POA: Diagnosis not present

## 2023-02-26 DIAGNOSIS — R918 Other nonspecific abnormal finding of lung field: Secondary | ICD-10-CM | POA: Diagnosis not present

## 2023-02-26 DIAGNOSIS — R42 Dizziness and giddiness: Secondary | ICD-10-CM | POA: Diagnosis not present

## 2023-02-26 DIAGNOSIS — I700 Atherosclerosis of aorta: Secondary | ICD-10-CM | POA: Insufficient documentation

## 2023-02-26 DIAGNOSIS — R072 Precordial pain: Secondary | ICD-10-CM | POA: Diagnosis not present

## 2023-02-26 DIAGNOSIS — L700 Acne vulgaris: Secondary | ICD-10-CM | POA: Diagnosis not present

## 2023-02-26 DIAGNOSIS — L570 Actinic keratosis: Secondary | ICD-10-CM | POA: Diagnosis not present

## 2023-02-26 DIAGNOSIS — I6523 Occlusion and stenosis of bilateral carotid arteries: Secondary | ICD-10-CM | POA: Diagnosis not present

## 2023-02-26 DIAGNOSIS — L821 Other seborrheic keratosis: Secondary | ICD-10-CM | POA: Diagnosis not present

## 2023-02-26 DIAGNOSIS — G9389 Other specified disorders of brain: Secondary | ICD-10-CM | POA: Diagnosis not present

## 2023-02-26 DIAGNOSIS — R0989 Other specified symptoms and signs involving the circulatory and respiratory systems: Secondary | ICD-10-CM | POA: Diagnosis not present

## 2023-02-26 DIAGNOSIS — R079 Chest pain, unspecified: Secondary | ICD-10-CM | POA: Diagnosis not present

## 2023-02-26 LAB — CBC
HCT: 43.3 % (ref 39.0–52.0)
Hemoglobin: 14.6 g/dL (ref 13.0–17.0)
MCH: 30.2 pg (ref 26.0–34.0)
MCHC: 33.7 g/dL (ref 30.0–36.0)
MCV: 89.6 fL (ref 80.0–100.0)
Platelets: 186 10*3/uL (ref 150–400)
RBC: 4.83 MIL/uL (ref 4.22–5.81)
RDW: 14 % (ref 11.5–15.5)
WBC: 5.2 10*3/uL (ref 4.0–10.5)
nRBC: 0 % (ref 0.0–0.2)

## 2023-02-26 LAB — PROTIME-INR
INR: 1.3 — ABNORMAL HIGH (ref 0.8–1.2)
Prothrombin Time: 16.4 s — ABNORMAL HIGH (ref 11.4–15.2)

## 2023-02-26 NOTE — ED Notes (Signed)
Patient transported to CT 

## 2023-02-26 NOTE — ED Triage Notes (Signed)
Pt sts he was sitting on the couch watching TV when he began to feel tight centralized chest pain and SOB. He then had a subsequent LOC lasting for approx 40 min.  Reports ongoing chest tightness, SOB, and weakness.

## 2023-02-26 NOTE — ED Provider Notes (Signed)
[facility] AT [facility] Provider Note   [name]: [id number] Arrival date & time: 02/26/23  2304     History  Chief Complaint  Patient presents with   Loss of Consciousness   Chest Pain    David Armstrong is a 72 y.o. male.  The history is provided by the patient.  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  He reports that he felt like he was going "crazy "he also reported shortness of breath.  He also suspects he had visual changes but is unable to clearly define how his vision was altered.  Denies any new medications.  He is on anticoagulation but recently stopped it for planned endoscopies. No recent falls.  He reports he had a similar episode about 1 week ago    Past Medical History:  Diagnosis Date   Anxiety    Arthritis    "lower back; hips;' right knee"    Bipolar disorder (HCC)    Chronic constipation    Depression    Diverticulosis of colon    Edema of both lower extremities    ANKLES   Fatty liver    GERD (gastroesophageal reflux disease)    Hiatal hernia    History of anal fissures    History of deep vein thrombosis (DVT) of lower extremity 02/11/2016   right lower extremity distal and proximal extensive acute   History of Helicobacter pylori infection 2012; 2009   History of kidney stones    History of pneumonia 01/2015   CAP   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 Authorizing Provider  baclofen (LIORESAL) 10 MG tablet Take 0.5-1 tablets (5-10 mg total) by mouth 3 (three) times daily. 07/24/22   Ardith Dark, MD  desonide (DESOWEN) 0.05 % cream Apply topically 2 (two) times daily. 10/02/22   Ardith Dark, MD  desonide (DESOWEN) 0.05 % lotion Apply topically 2 (two) times daily. 10/03/22   Ardith Dark, MD  docusate sodium (COLACE) 100 MG capsule Take 1 capsule (100 mg total) by mouth daily as needed for up to 30 doses. 09/05/22   Jannifer Hick, MD  fluticasone (FLONASE) 50 MCG/ACT nasal spray Place 2 sprays into both nostrils daily. USE twice a day for 1 week, then reduce to using daily. 12/21/21   Ardith Dark, MD  hyoscyamine (LEVSIN SL) 0.125 MG SL tablet DISSOLVE 2 TABLETS UNDER THE TONGUE EVERY 6 HOURS AS NEEDED 06/17/22   Ardith Dark, MD  ketoconazole (NIZORAL) 2 % shampoo Apply 1 Application topically daily. 10/02/22   Ardith Dark, MD  LORazepam (ATIVAN) 1 MG tablet Take 0.5-1 tablets (0.5-1 mg total) by mouth every 8 (eight) hours as needed for anxiety.  05/26/17   Ardith Dark, MD  omeprazole (PRILOSEC) 40 MG capsule Take 1 capsule (40 mg total) by mouth daily. 02/24/23   Pyrtle, Carie Caddy, MD  rivaroxaban (XARELTO) 20 MG TABS tablet Take 1 tablet (20 mg total) by mouth daily with supper. START this only after you have completed the starter pack. 01/26/22   Ghimire, Werner Lean, MD  senna-docusate (SENOKOT-S) 8.6-50 MG tablet Take 1 tablet by mouth at bedtime as needed for mild constipation. 05/25/22   Long, Arlyss Repress, MD  tiZANidine (ZANAFLEX) 4 MG tablet tizanidine 4 mg 06/13/21   [provider]  traMADol (ULTRAM) 50 MG tablet Take 1 tablet (50 mg total) by mouth 3 (three) times daily as needed for moderate pain or severe pain. 07/24/22   Ardith Dark, MD  triamcinolone cream (KENALOG) 0.1 % Apply  1 Application topically 2 (two) times daily. 04/18/22   [provider]  venlafaxine XR (EFFEXOR XR) 37.5 MG 24 hr capsule Take 1 capsule (37.5 mg total) by mouth daily with breakfast. 12/26/22   Ardith Dark, MD  Vitamin D, Ergocalciferol, (DRISDOL) 1.25 MG (50000 UNIT) CAPS capsule Take 50,000 Units by mouth once a week. 01/10/22   [provider]  XDEMVY 0.25 % SOLN Apply to eye. 05/20/22   [provider]      Allergies    Effexor xr [venlafaxine hcl er], Acyclovir and related, Zocor [simvastatin], Dexilant [dexlansoprazole], Ivp dye [iodinated contrast media], Soma [carisoprodol], and Sulfa antibiotics    Review of Systems   Review of Systems  Constitutional:  Negative for fever.  Cardiovascular:  Positive for chest pain.  Neurological:  Positive for dizziness.    Physical Exam Updated Vital Signs BP 126/82   Pulse 74   Temp 97.6 F (36.4 C) (Oral)   Resp (!) 21   Ht 1.778 m (5\' 10" )   Wt 61.2 kg   SpO2 100%   BMI 19.37 kg/m  Physical Exam CONSTITUTIONAL: Elderly and anxious HEAD: Normocephalic/atraumatic EYES: EOMI/PERRL, no nystagmus, no ptosis ENMT: Mucous membranes moist NECK: supple no meningeal signs CV: S1/S2 noted, no murmurs/rubs/gallops noted LUNGS: Lungs are clear to auscultation bilaterally, no apparent distress ABDOMEN: soft, nontender, no rebound or guarding GU:no cva tenderness NEURO:Awake/alert, face symmetric, no arm or leg drift is noted Equal 5/5 strength with shoulder abduction, elbow flex/extension, wrist flex/extension in upper extremities and equal hand grips bilaterally Equal 5/5 strength with hip flexion,knee flex/extension, foot dorsi/plantar flexion Cranial nerves 3/4/5/6/10/20/08/11/12 tested and intact Sensation to light touch intact in all extremities EXTREMITIES: pulses normal, full ROM, no deformities SKIN: warm, color normal PSYCH: Anxious  ED Results / Procedures / Treatments   Labs (all labs ordered are  listed, but only abnormal results are displayed) Labs Reviewed  PROTIME-INR - Abnormal; Notable for the following components:      Result Value   Prothrombin Time 16.4 (*)    INR 1.3 (*)    All other components within normal limits  RAPID URINE DRUG SCREEN, HOSP PERFORMED - Abnormal; Notable for the following components:   Benzodiazepines POSITIVE (*)    All other components within normal limits  HEPATIC FUNCTION PANEL - Abnormal; Notable for the following components:   Total Protein 5.6 (*)    Albumin 3.4 (*)    All other components within normal limits  RESP PANEL BY RT-PCR (RSV, FLU A&B, COVID)  RVPGX2  BASIC METABOLIC PANEL  CBC  ETHANOL  URINALYSIS, ROUTINE W REFLEX MICROSCOPIC  TROPONIN I (HIGH SENSITIVITY)  TROPONIN I (HIGH SENSITIVITY)    EKG EKG Interpretation Date/Time:  Thursday February 26 2023 23:16:49 EST Ventricular Rate:  70 PR Interval:  205 QRS Duration:  96 QT Interval:  397 QTC Calculation: 429 R Axis:   33  Text Interpretation: Sinus rhythm Abnormal R-wave progression, early transition Confirmed by Zadie Rhine ([id number]) on 02/26/2023 11:28:58 PM  Radiology DG Chest Portable 1 View  Result Date: 02/27/2023 CLINICAL DATA:  Chest pain. EXAM: PORTABLE CHEST 1 VIEW COMPARISON:  December 24, 2021 FINDINGS: The heart size and mediastinal contours are within normal limits. There is marked severity calcification of the thoracic aorta. Low lung volumes are noted with mild, diffuse, chronic appearing increased lung markings. Mild linear scarring and/or atelectasis is seen within the left lung base. No pleural effusion or pneumothorax is identified. Multilevel degenerative changes seen throughout the thoracic spine with evidence of prior vertebroplasty within the mid and lower thoracic spine. IMPRESSION: Low lung volumes with mild left basilar linear scarring and/or atelectasis. Electronically Signed   By: Aram Candela M.D.   On: 02/27/2023 00:49   CT HEAD  WO CONTRAST  Result Date: 02/27/2023 CLINICAL DATA:  Centralized chest pain and shortness of breath with subsequent loss of consciousness. EXAM: CT HEAD WITHOUT CONTRAST TECHNIQUE: Contiguous axial images were obtained from the base of the skull through the vertex without intravenous contrast. RADIATION DOSE REDUCTION: This exam was performed according to the departmental dose-optimization program which includes automated exposure control, adjustment of the mA and/or kV according to patient size and/or use of iterative reconstruction technique. COMPARISON:  April 11, 2022 FINDINGS: Brain: There is mild cerebral atrophy with widening of the extra-axial spaces and ventricular dilatation. There are areas of decreased attenuation within the white matter tracts of the supratentorial brain, consistent with microvascular disease changes. Vascular: Mild to moderate severity bilateral cavernous carotid artery calcification is seen. Skull: Normal. Negative for fracture or focal lesion. Sinuses/Orbits: No acute finding. Other: None. IMPRESSION: 1. Generalized cerebral atrophy with chronic white matter small vessel ischemic changes. 2. No acute intracranial abnormality. Electronically Signed   By: Aram Candela M.D.   On: 02/27/2023 00:47    Procedures Procedures    Medications Ordered in ED Medications - No data to display  ED Course/ Medical Decision Making/ A&P Clinical Course as of 02/27/23 0145  Fri Feb 27, 2023  0002 Patient presents for multiple complaints.  Reports he woke up that he had passed out and numbness throughout his body and chest pain, neck pain and felt lightheaded.  Patient appears very anxious.  There is no focal neurodeficits on exam  Patient did have recent endoscopy and colonoscopy which revealed colonic polyps and gastric ulcer.  Labs and imaging are pending at this time [DW]  0144 Patient reports feeling improved.  He has no new complaints.  No chest pain or shortness of  breath at this time.  Overall his vitals a bit appropriate.  My recommendation was admission due to an unclear history of exactly what happened whether this was a syncopal episode or if he just fell asleep.  Patient prefers to be discharged home.  He reports feeling back to his baseline. [DW]  0144 Advise close follow-up with his PCP next week.  We discussed strict ER return precautions [DW]    Clinical Course User Index [DW] Zadie Rhine, MD  Medical Decision Making Amount and/or Complexity of Data Reviewed Labs: ordered. Radiology: ordered.   This patient presents to the ED for concern of syncope, chest pain, this involves an extensive number of treatment options, and is a complaint that carries with it a high risk of complications and morbidity.  The differential diagnosis includes but is not limited to CVA, intracranial hemorrhage, acute coronary syndrome, renal failure, urinary tract infection, electrolyte disturbance, pneumonia acute coronary syndrome, aortic dissection, pulmonary embolism, pericarditis, pneumothorax, pneumonia, myocarditis, pleurisy, esophageal rupture    Comorbidities that complicate the patient evaluation: Patient's presentation is complicated by their history of bipolar disorder and VTE  Social Determinants of Health: Patient's  poor health literacy   increases the complexity of managing their presentation  Additional history obtained: Records reviewed previous admission documents  Lab Tests: I Ordered, and personally interpreted labs.  The pertinent results include: Labs overall unremarkable  Imaging Studies ordered: I ordered imaging studies including CT scan head and X-ray chest   I independently visualized and interpreted imaging which showed no acute findings I agree with the radiologist interpretation  Cardiac Monitoring: The patient was maintained on a cardiac monitor.  I personally viewed and interpreted the  cardiac monitor which showed an underlying rhythm of:  sinus rhythm   Reevaluation: After the interventions noted above, I reevaluated the patient and found that they have :improved  Complexity of problems addressed: Patient's presentation is most consistent with  acute presentation with potential threat to life or bodily function  Disposition: After consideration of the diagnostic results and the patient's response to treatment,  I feel that the patent would benefit from discharge   .           Final Clinical Impression(s) / ED Diagnoses Final diagnoses:  Precordial pain  Near syncope    Rx / DC Orders ED Discharge Orders     None         Zadie Rhine, MD 02/27/23 [phone]

## 2023-02-27 DIAGNOSIS — I6782 Cerebral ischemia: Secondary | ICD-10-CM | POA: Diagnosis not present

## 2023-02-27 DIAGNOSIS — R55 Syncope and collapse: Secondary | ICD-10-CM | POA: Diagnosis not present

## 2023-02-27 DIAGNOSIS — G9389 Other specified disorders of brain: Secondary | ICD-10-CM | POA: Diagnosis not present

## 2023-02-27 DIAGNOSIS — I6523 Occlusion and stenosis of bilateral carotid arteries: Secondary | ICD-10-CM | POA: Diagnosis not present

## 2023-02-27 LAB — HEPATIC FUNCTION PANEL
ALT: 14 U/L (ref 0–44)
AST: 18 U/L (ref 15–41)
Albumin: 3.4 g/dL — ABNORMAL LOW (ref 3.5–5.0)
Alkaline Phosphatase: 71 U/L (ref 38–126)
Bilirubin, Direct: 0.1 mg/dL (ref 0.0–0.2)
Indirect Bilirubin: 0.6 mg/dL (ref 0.3–0.9)
Total Bilirubin: 0.7 mg/dL (ref <1.2)
Total Protein: 5.6 g/dL — ABNORMAL LOW (ref 6.5–8.1)

## 2023-02-27 LAB — RAPID URINE DRUG SCREEN, HOSP PERFORMED
Amphetamines: NOT DETECTED
Barbiturates: NOT DETECTED
Benzodiazepines: POSITIVE — AB
Cocaine: NOT DETECTED
Opiates: NOT DETECTED
Tetrahydrocannabinol: NOT DETECTED

## 2023-02-27 LAB — URINALYSIS, ROUTINE W REFLEX MICROSCOPIC
Bilirubin Urine: NEGATIVE
Glucose, UA: NEGATIVE mg/dL
Hgb urine dipstick: NEGATIVE
Ketones, ur: NEGATIVE mg/dL
Leukocytes,Ua: NEGATIVE
Nitrite: NEGATIVE
Protein, ur: NEGATIVE mg/dL
Specific Gravity, Urine: 1.011 (ref 1.005–1.030)
pH: 7 (ref 5.0–8.0)

## 2023-02-27 LAB — BASIC METABOLIC PANEL
Anion gap: 9 (ref 5–15)
BUN: 13 mg/dL (ref 8–23)
CO2: 24 mmol/L (ref 22–32)
Calcium: 9.2 mg/dL (ref 8.9–10.3)
Chloride: 104 mmol/L (ref 98–111)
Creatinine, Ser: 0.93 mg/dL (ref 0.61–1.24)
GFR, Estimated: >60 mL/min (ref >60)
Glucose, Bld: 93 mg/dL (ref 70–99)
Potassium: 3.7 mmol/L (ref 3.5–5.1)
Sodium: 137 mmol/L (ref 135–145)

## 2023-02-27 LAB — RESP PANEL BY RT-PCR (RSV, FLU A&B, COVID)  RVPGX2
Influenza A by PCR: NEGATIVE
Influenza B by PCR: NEGATIVE
Resp Syncytial Virus by PCR: NEGATIVE
SARS Coronavirus 2 by RT PCR: NEGATIVE

## 2023-02-27 LAB — ETHANOL: Alcohol, Ethyl (B): <10 mg/dL (ref <10)

## 2023-02-27 LAB — TROPONIN I (HIGH SENSITIVITY)
Troponin I (High Sensitivity): 5 ng/L (ref <18)
Troponin I (High Sensitivity): 4 ng/L (ref <18)

## 2023-02-27 NOTE — Discharge Instructions (Signed)

## 2023-03-02 LAB — SURGICAL PATHOLOGY

## 2023-03-03 ENCOUNTER — Encounter: Payer: Self-pay | Admitting: Internal Medicine

## 2023-03-06 ENCOUNTER — Encounter: Payer: Self-pay | Admitting: Family Medicine

## 2023-03-06 ENCOUNTER — Ambulatory Visit (INDEPENDENT_AMBULATORY_CARE_PROVIDER_SITE_OTHER): Payer: Medicare Other | Admitting: Family Medicine

## 2023-03-06 VITALS — BP 107/74 | HR 78 | Temp 97.8°F | Ht 68.0 in | Wt 137.2 lb

## 2023-03-06 DIAGNOSIS — I824Z1 Acute embolism and thrombosis of unspecified deep veins of right distal lower extremity: Secondary | ICD-10-CM | POA: Diagnosis not present

## 2023-03-06 DIAGNOSIS — K219 Gastro-esophageal reflux disease without esophagitis: Secondary | ICD-10-CM

## 2023-03-06 DIAGNOSIS — M549 Dorsalgia, unspecified: Secondary | ICD-10-CM

## 2023-03-06 DIAGNOSIS — G8929 Other chronic pain: Secondary | ICD-10-CM

## 2023-03-06 DIAGNOSIS — F411 Generalized anxiety disorder: Secondary | ICD-10-CM | POA: Diagnosis not present

## 2023-03-06 DIAGNOSIS — F320 Major depressive disorder, single episode, mild: Secondary | ICD-10-CM

## 2023-03-06 MED ORDER — TRAMADOL HCL 50 MG PO TABS: 50.0000 mg | ORAL_TABLET | Freq: Three times a day (TID) | ORAL | 5 refills | Status: DC | PRN

## 2023-03-06 MED ORDER — METHYLPREDNISOLONE ACETATE 80 MG/ML IJ SUSP
80.0000 mg | Freq: Once | INTRAMUSCULAR | Status: AC
  Administered 2023-03-06: 80 mg via INTRAMUSCULAR

## 2023-03-06 MED ORDER — OMEPRAZOLE 40 MG PO CPDR: 40.0000 mg | DELAYED_RELEASE_CAPSULE | Freq: Every day | ORAL | Status: DC

## 2023-03-06 NOTE — Addendum Note (Signed)
Addended by: Dyann Kief on: 03/06/2023 01:12 PM   Modules accepted: Orders

## 2023-03-06 NOTE — Assessment & Plan Note (Signed)
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  We need to avoid NSAIDs as above especially in light of recent PUD diagnosis.

## 2023-03-06 NOTE — Assessment & Plan Note (Signed)
Following with psychiatry.  On Effexor 37.5 mg daily and Ativan as needed.

## 2023-03-06 NOTE — Patient Instructions (Addendum)
It was very nice to see you today!  We will refill your tramadol today.  We will give a steroid injection today.  Please follow up with your neurologist soon.   Return if symptoms worsen or fail to improve.   Take care, Dr Jimmey Ralph  PLEASE NOTE:  If you had any lab tests, please let us know if you have not heard back within a few days. You may see your results on mychart before we have a chance to review them but we will give you a call once they are reviewed by [place].   If we ordered any referrals today, please let us know if you have not heard from their office within the next week.   If you had any urgent prescriptions sent in today, please check with the pharmacy within an hour of our visit to make sure the prescription was transmitted appropriately.   Please try these tips to maintain a healthy lifestyle:  Eat at least 3 REAL meals and 1-2 snacks per day.  Aim for no more than 5 hours between eating.  If you eat breakfast, please do so within one hour of getting up.   Each meal should contain half fruits/vegetables, one quarter protein, and one quarter carbs (no bigger than a computer mouse)  Cut down on sweet beverages. This includes juice, soda, and sweet tea.   Drink at least 1 glass of water with each meal and aim for at least 8 glasses per day  Exercise at least 150 minutes every week.

## 2023-03-06 NOTE — Assessment & Plan Note (Signed)
Follows with neurosurgery for this. No red flag signs or symptoms.  Pain has been stable over the last few months.  He has had ESIs several months ago which did help modestly and may be interested in pursuing these again in the future.  We will refill his tramadol today.  He has been tolerating well without any significant side effects.  Database without red flags.  We need to avoid NSAIDs in light of his recent PUD diagnosis.  Will give 80 mg IM Depo-Medrol today.  He has done well with this in the past.  He can continue using his muscle laxer as well.  He will follow-up with neurosurgery soon as previously planned to discuss repeat ESI or radiofrequency ablation.  We discussed reasons return to care or seek emergent care.

## 2023-03-06 NOTE — Assessment & Plan Note (Signed)
Following with psychiatry.  On venlafaxine 37.5 mg daily and Ativan as needed.

## 2023-03-06 NOTE — Assessment & Plan Note (Signed)
Following with GI.  Recent EGD did show gastric ulcer with hiatal hernia.  He is on omeprazole 40 mg daily.  We need to avoid NSAIDs.  He will follow-up with GI in a few months.

## 2023-03-06 NOTE — Progress Notes (Signed)
David Armstrong is a 72 y.o. male who presents today for an office visit.  Assessment/Plan:  Chronic Problems Addressed Today: Chronic back pain Follows with neurosurgery for this. No red flag signs or symptoms.  Pain has been stable over the last few months.  He has had ESIs several months ago which did help modestly and may be interested in pursuing these again in the future.  We will refill his tramadol today.  He has been tolerating well without any significant side effects.  Database without red flags.  We need to avoid NSAIDs in light of his recent PUD diagnosis.  Will give 80 mg IM Depo-Medrol today.  He has done well with this in the past.  He can continue using his muscle laxer as well.  He will follow-up with neurosurgery soon as previously planned to discuss repeat ESI or radiofrequency ablation.  We discussed reasons return to care or seek emergent care.  GERD Following with GI.  Recent EGD did show gastric ulcer with hiatal hernia.  He is on omeprazole 40 mg daily.  We need to avoid NSAIDs.  He will follow-up with GI in a few months.  Depression, major, single episode, mild (HCC) Following with psychiatry.  On Effexor 37.5 mg daily and Ativan as needed.  Anxiety state Following with psychiatry.  On venlafaxine 37.5 mg daily and Ativan as needed.  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  We need to avoid NSAIDs as above especially in light of recent PUD diagnosis.     Subjective:  HPI:  See Assessment / plan for status of chronic conditions. He is here today with flare up of his back pain. We last saw him a couple of months ago.  At that time, he was having issues with ongoing chronic back pain.  At that time we gave him an injection of Toradol and Depo-Medrol.  He did well with this and may be interested in repeat injections today.  He also is having some issues with medial epicondylitis.  We discussed home  exercise program.  Since our last visit he has followed with gastroenterology and had EGD that showed hiatal hernia with ulcer. He did also have a stenosis that was dilated. He had a colonoscopy that showed several polyps as well. He was started on omeprazole 40 mg daily. He has tolerated well. He is still having some epigastric pain but this is improving. He has not been taking any ibuprofen.   His back pain is about the same over the last several months. He would like to have a depomedrol injection today. He is currently following with neurosurgery and has been getting ESI. He will follow up with them soon. HE would like to have a refill on his tramadol today.          Objective:  Physical Exam: BP 107/74   Pulse 78   Temp 97.8 F (36.6 C) (Temporal)   Ht 5\' 8"  (1.727 m)   Wt 137 lb 3.2 oz (62.2 kg)   SpO2 100%   BMI 20.86 kg/m   Gen: No acute distress, resting comfortably Neuro: Grossly normal, moves all extremities Psych: Normal affect and thought content  Time Spent: 45 minutes of total time was spent on the date of the encounter performing the following actions: chart review prior to seeing the patient, obtaining history, performing a medically necessary exam, counseling on the treatment plan, placing orders, and documenting in our  EHR.        Katina Degree. Jimmey Ralph, MD 03/06/2023 11:17 AM

## 2023-03-11 DIAGNOSIS — L570 Actinic keratosis: Secondary | ICD-10-CM | POA: Diagnosis not present

## 2023-03-17 ENCOUNTER — Ambulatory Visit (INDEPENDENT_AMBULATORY_CARE_PROVIDER_SITE_OTHER): Payer: Medicare Other | Admitting: Family Medicine

## 2023-03-17 ENCOUNTER — Encounter: Payer: Self-pay | Admitting: Family Medicine

## 2023-03-17 VITALS — BP 110/70 | HR 52 | Temp 97.8°F | Ht 68.0 in | Wt 136.8 lb

## 2023-03-17 DIAGNOSIS — G8929 Other chronic pain: Secondary | ICD-10-CM | POA: Diagnosis not present

## 2023-03-17 DIAGNOSIS — F320 Major depressive disorder, single episode, mild: Secondary | ICD-10-CM

## 2023-03-17 DIAGNOSIS — R739 Hyperglycemia, unspecified: Secondary | ICD-10-CM | POA: Diagnosis not present

## 2023-03-17 DIAGNOSIS — E538 Deficiency of other specified B group vitamins: Secondary | ICD-10-CM

## 2023-03-17 DIAGNOSIS — R351 Nocturia: Secondary | ICD-10-CM | POA: Diagnosis not present

## 2023-03-17 DIAGNOSIS — M549 Dorsalgia, unspecified: Secondary | ICD-10-CM | POA: Diagnosis not present

## 2023-03-17 DIAGNOSIS — F411 Generalized anxiety disorder: Secondary | ICD-10-CM

## 2023-03-17 DIAGNOSIS — E785 Hyperlipidemia, unspecified: Secondary | ICD-10-CM

## 2023-03-17 DIAGNOSIS — I2602 Saddle embolus of pulmonary artery with acute cor pulmonale: Secondary | ICD-10-CM

## 2023-03-17 DIAGNOSIS — K219 Gastro-esophageal reflux disease without esophagitis: Secondary | ICD-10-CM | POA: Diagnosis not present

## 2023-03-17 LAB — LIPID PANEL
Cholesterol: 194 mg/dL (ref 0–200)
HDL: 48.4 mg/dL (ref >39.00)
LDL Cholesterol: 110 mg/dL — ABNORMAL HIGH (ref 0–99)
NonHDL: 145.99
Total CHOL/HDL Ratio: 4
Triglycerides: 179 mg/dL — ABNORMAL HIGH (ref 0.0–149.0)
VLDL: 35.8 mg/dL (ref 0.0–40.0)

## 2023-03-17 LAB — URINALYSIS, ROUTINE W REFLEX MICROSCOPIC
Bilirubin Urine: NEGATIVE
Ketones, ur: NEGATIVE
Leukocytes,Ua: NEGATIVE
Nitrite: NEGATIVE
Specific Gravity, Urine: 1.015 (ref 1.000–1.030)
Total Protein, Urine: NEGATIVE
Urine Glucose: NEGATIVE
Urobilinogen, UA: 0.2 (ref 0.0–1.0)
pH: 6.5 (ref 5.0–8.0)

## 2023-03-17 LAB — COMPREHENSIVE METABOLIC PANEL
ALT: 11 U/L (ref 0–53)
AST: 15 U/L (ref 0–37)
Albumin: 4.1 g/dL (ref 3.5–5.2)
Alkaline Phosphatase: 90 U/L (ref 39–117)
BUN: 13 mg/dL (ref 6–23)
CO2: 31 meq/L (ref 19–32)
Calcium: 8.9 mg/dL (ref 8.4–10.5)
Chloride: 106 meq/L (ref 96–112)
Creatinine, Ser: 0.75 mg/dL (ref 0.40–1.50)
GFR: 90.49 mL/min (ref >60.00)
Glucose, Bld: 99 mg/dL (ref 70–99)
Potassium: 4.4 meq/L (ref 3.5–5.1)
Sodium: 142 meq/L (ref 135–145)
Total Bilirubin: 0.6 mg/dL (ref 0.2–1.2)
Total Protein: 6.5 g/dL (ref 6.0–8.3)

## 2023-03-17 LAB — TSH: TSH: 3.73 u[IU]/mL (ref 0.35–5.50)

## 2023-03-17 LAB — CBC
HCT: 42.9 % (ref 39.0–52.0)
Hemoglobin: 14.2 g/dL (ref 13.0–17.0)
MCHC: 33.1 g/dL (ref 30.0–36.0)
MCV: 92 fL (ref 78.0–100.0)
Platelets: 202 10*3/uL (ref 150.0–400.0)
RBC: 4.66 Mil/uL (ref 4.22–5.81)
RDW: 14.5 % (ref 11.5–15.5)
WBC: 5.2 10*3/uL (ref 4.0–10.5)

## 2023-03-17 LAB — PSA: PSA: 1.39 ng/mL (ref 0.10–4.00)

## 2023-03-17 LAB — VITAMIN B12: Vitamin B-12: 139 pg/mL — ABNORMAL LOW (ref 211–911)

## 2023-03-17 LAB — HEMOGLOBIN A1C: Hgb A1c MFr Bld: 5.4 % (ref 4.6–6.5)

## 2023-03-17 NOTE — Assessment & Plan Note (Signed)
Following with neurosurgery.  Modest improvement with recent Depo-Medrol injection.  Need to avoid NSAIDs as above.  He will follow-up with neurosurgery soon to discuss repeat ESI or radiofrequency ablation.

## 2023-03-17 NOTE — Assessment & Plan Note (Signed)
Check labs.  Anticoagulated on Xarelto.

## 2023-03-17 NOTE — Assessment & Plan Note (Signed)
He has been under little more stress recently however symptoms are stable.  He follows with psychiatry.  Uses Ativan as needed.  On venlafaxine 37.5 mg daily.

## 2023-03-17 NOTE — Assessment & Plan Note (Signed)
Following with gastroenterology.  Now on omeprazole 40 mg daily.  We are avoiding NSAIDs.  Has repeat EGD in a couple of months.

## 2023-03-17 NOTE — Assessment & Plan Note (Signed)
Check B12 

## 2023-03-17 NOTE — Assessment & Plan Note (Signed)
Check lipids.  Discussed lifestyle modifications.  He is trying to stay active.

## 2023-03-17 NOTE — Assessment & Plan Note (Signed)
Overall symptoms stable.  Follows with psychiatry.  On Effexor 37.5 mg daily and Ativan as needed.

## 2023-03-17 NOTE — Progress Notes (Signed)
David Armstrong is a 72 y.o. male who presents today for an office visit.  Assessment/Plan:  Chronic Problems Addressed Today: Anxiety state He has been under little more stress recently however symptoms are stable.  He follows with psychiatry.  Uses Ativan as needed.  On venlafaxine 37.5 mg daily.  Depression, major, single episode, mild (HCC) Overall symptoms stable.  Follows with psychiatry.  On Effexor 37.5 mg daily and Ativan as needed.  GERD Following with gastroenterology.  Now on omeprazole 40 mg daily.  We are avoiding NSAIDs.  Has repeat EGD in a couple of months.   Dyslipidemia Check lipids.  Discussed lifestyle modifications.  He is trying to stay active.    Chronic back pain Following with neurosurgery.  Modest improvement with recent Depo-Medrol injection.  Need to avoid NSAIDs as above.  He will follow-up with neurosurgery soon to discuss repeat ESI or radiofrequency ablation.  Pulmonary emboli (HCC) Check labs.  Anticoagulated on Xarelto.   B12 deficiency Check B12.   Preventative Healthcare - Check labs.  Up-to-date on vaccines.  Due for colonoscopy 2027.    Subjective:  HPI:  See Assessment / plan for status of chronic conditions.  Patient is here today for annual visit.We saw him about a week and a half ago for chronic back pain.  We refilled his tramadol and gave 80 mg IM Depo-Medrol.  You were avoiding NSAIDs due to his recent PUD diagnosis.  He has had modest improvement in his symptoms since our last visit.   ROS: Per HPI, otherwise a complete review of systems was negative.   PMH:  The following were reviewed and entered/updated in epic: Past Medical History:  Diagnosis Date   Anxiety    Arthritis    "lower back; hips;' right knee"    Bipolar disorder (HCC)    Chronic constipation    Depression    Diverticulosis of colon    Edema of both lower extremities    ANKLES   Fatty liver    GERD (gastroesophageal reflux disease)    Hiatal hernia     History of anal fissures    History of deep vein thrombosis (DVT) of lower extremity 02/11/2016   right lower extremity distal and proximal extensive acute   History of Helicobacter pylori infection 2012; 2009   History of kidney stones    History of pneumonia 01/2015   CAP   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    Hyperlipidemia    IBS (irritable bowel syndrome)    Left ureteral stone    Nocturia    Peripheral neuropathy    per pt due to back DDD   Personal history of colonic polyps    HYPERPLASTIC POLYP   Pulmonary nodule, right    incidental finding CT chest angio 02-11-2016 stable   TMJ (temporomandibular joint syndrome)    Varicosities of leg    Patient Active Problem List   Diagnosis Date Noted   Screening for colon cancer 12/24/2022   Dysphagia 12/24/2022   Generalized abdominal pain 12/24/2022   B12 deficiency 05/20/2022   Pulmonary emboli (HCC) 12/24/2021   Pressure injury of skin 12/24/2021   Osteoporosis 08/30/2021   Chronic back pain 07/29/2021   Venous stasis dermatitis 06/17/2021   Pain in joint of right knee 04/10/2021   Sleep-disordered breathing 11/30/2020   Skin lesion 11/30/2020   Headache 10/10/2020   Cataracts, bilateral 07/18/2020   Osteoarthritis 06/07/2020   DVT (deep venous thrombosis) (HCC) 02/16/2020  Leg edema 01/13/2020   Internal hemorrhoids 02/24/2018   Dyslipidemia 02/05/2018   Seborrheic dermatitis 09/14/2017   Inguinal hernia 06/05/2017   Allergic rhinitis 04/23/2017   Chronic anticoagulation 06/04/2016   History of pulmonary embolism and DVT (Oct 2017) 02/11/2016   ETD (Eustachian tube dysfunction), bilateral 02/04/2016   Chronic pansinusitis 12/19/2015   Cough, persistent 12/19/2015   Nephrolithiasis 04/19/2014   Peripheral neuropathy (HCC) 12/15/2013   IBS (irritable bowel syndrome) 03/09/2012   GERD 10/26/2007   Anxiety state 11/23/2006   Depression, major, single episode, mild (HCC)  11/23/2006   Past Surgical History:  Procedure Laterality Date   BELPHAROPTOSIS REPAIR Bilateral 01/2015   CATARACT EXTRACTION W/ INTRAOCULAR LENS  IMPLANT, BILATERAL  04/2016   COLONOSCOPY WITH ESOPHAGOGASTRODUODENOSCOPY (EGD)  last one 03-02-2013   CYSTOSCOPY/RETROGRADE/URETEROSCOPY/STONE EXTRACTION WITH BASKET  2010   CYSTOSCOPY/URETEROSCOPY/HOLMIUM LASER/STENT PLACEMENT Left 03/16/2017   Procedure: CYSTOSCOPY/RETROGRADE/URETEROSCOPY/HOLMIUM LASER/STENT PLACEMENT, STONE BASKET EXTRACTION AND STENT PLACEMENT;  Surgeon: Ihor Gully, MD;  Location: [facility];  Service: Urology;  Laterality: Left;   EXCISIONAL HEMORRHOIDECTOMY  2015 approx.   EXTRACORPOREAL SHOCK WAVE LITHOTRIPSY  1987; 1992   EXTRACORPOREAL SHOCK WAVE LITHOTRIPSY Left 09/05/2022   Procedure: LEFT EXTRACORPOREAL SHOCK WAVE LITHOTRIPSY (ESWL);  Surgeon: Jannifer Hick, MD;  Location: [facility];  Service: Urology;  Laterality: Left;   EXTRACORPOREAL SHOCK WAVE LITHOTRIPSY Right 09/22/2022   Procedure: RIGHT EXTRACORPOREAL SHOCK WAVE LITHOTRIPSY (ESWL);  Surgeon: Marcine Matar, MD;  Location[facility];  Service: Urology;  Laterality: Right;   EXTRACORPOREAL SHOCK WAVE LITHOTRIPSY Left 11/24/2022   Procedure: LEFT EXTRACORPOREAL SHOCK WAVE LITHOTRIPSY (ESWL);  Surgeon: Rene Paci, MD;  Location: [facility];  Service: Urology;  Laterality: Left;   FOOT NEUROMA SURGERY Bilateral left 09/2015; right 04/ 2017   morton' neuroma   FOOT NEUROMA SURGERY Right 2004   morton's neuroma and removal heel spur   HAMMER TOE SURGERY Left 2013   5th toe   INCISION AND DRAINAGE FOOT Left 1992   "ball of foot; stepped on piece of hard wire & it went thru my shoe"   IR ANGIOGRAM PULMONARY BILATERAL SELECTIVE  12/24/2021   IR ANGIOGRAM SELECTIVE EACH ADDITIONAL VESSEL  12/24/2021   IR ANGIOGRAM SELECTIVE EACH ADDITIONAL VESSEL  12/24/2021   IR INFUSION THROMBOL  ARTERIAL INITIAL (MS)  12/24/2021   IR INFUSION THROMBOL ARTERIAL INITIAL (MS)  12/24/2021   IR THROMB F/U EVAL ART/VEN FINAL DAY (MS)  12/25/2021   IR US GUIDE VASC ACCESS RIGHT  12/24/2021   KNEE ARTHROSCOPY Bilateral left x2 1997;  right 2004 & 2005   NASAL FRACTURE SURGERY  1997   PATELLA-FEMORAL ARTHROPLASTY Right 07-06-2003   dr Renae Fickle [facility]   PLANTAR FASCIA RELEASE Bilateral 1987 & 1993   RHINOPLASTY  2000   SHOULDER ARTHROSCOPY Left 2012;  2013   SHOULDER ARTHROSCOPY WITH DISTAL CLAVICLE RESECTION Right 07-31-2005   dr Eulah Pont  [facility]   w/ Debridement labral and adhesions, acromioplasty, bursectomy, and CA ligament release   TRANSTHORACIC ECHOCARDIOGRAM  02/12/2016   ef 65-70%, grade 1 diastolic dysfunction/  trivial TR    Family History  Problem Relation Age of Onset   Heart disease Mother    Stroke Mother    Cirrhosis Father    Heart disease Father    Skin cancer Father    Heart disease Brother    Prostate cancer Maternal Grandfather    Parkinson's disease Paternal Grandfather    Colon cancer Neg Hx  Stomach cancer Neg Hx    Esophageal cancer Neg Hx    Rectal cancer Neg Hx     Medications- reviewed and updated Current Outpatient Medications  Medication Sig Dispense Refill   baclofen (LIORESAL) 10 MG tablet Take 0.5-1 tablets (5-10 mg total) by mouth 3 (three) times daily. 30 each 0   desonide (DESOWEN) 0.05 % cream Apply topically 2 (two) times daily. 30 g 0   desonide (DESOWEN) 0.05 % lotion Apply topically 2 (two) times daily. 59 mL 0   fluticasone (FLONASE) 50 MCG/ACT nasal spray Place 2 sprays into both nostrils daily. USE twice a day for 1 week, then reduce to using daily. 16 g 6   hyoscyamine (LEVSIN SL) 0.125 MG SL tablet DISSOLVE 2 TABLETS UNDER THE TONGUE EVERY 6 HOURS AS NEEDED 720 tablet 0   LORazepam (ATIVAN) 1 MG tablet Take 0.5-1 tablets (0.5-1 mg total) by mouth every 8 (eight) hours as needed for anxiety. 90 tablet 0   omeprazole (PRILOSEC) 40 MG capsule  Take 1 capsule (40 mg total) by mouth daily.     rivaroxaban (XARELTO) 20 MG TABS tablet Take 1 tablet (20 mg total) by mouth daily with supper. START this only after you have completed the starter pack. 90 tablet 1   senna-docusate (SENOKOT-S) 8.6-50 MG tablet Take 1 tablet by mouth at bedtime as needed for mild constipation. 20 tablet 0   tiZANidine (ZANAFLEX) 4 MG tablet      traMADol (ULTRAM) 50 MG tablet Take 1 tablet (50 mg total) by mouth 3 (three) times daily as needed for moderate pain (pain score 4-6) or severe pain (pain score 7-10). 90 tablet 5   triamcinolone cream (KENALOG) 0.1 % Apply 1 Application topically 2 (two) times daily.     venlafaxine XR (EFFEXOR XR) 37.5 MG 24 hr capsule Take 1 capsule (37.5 mg total) by mouth daily with breakfast. 90 capsule 3   Vitamin D, Ergocalciferol, (DRISDOL) 1.25 MG (50000 UNIT) CAPS capsule Take 50,000 Units by mouth once a week.     XDEMVY 0.25 % SOLN Apply to eye.     No current facility-administered medications for this visit.    Allergies-reviewed and updated Allergies  Allergen Reactions   Effexor Xr [Venlafaxine Hcl Er] Other (See Comments)    Insomnia   Acyclovir And Related    Zocor [Simvastatin] Other (See Comments)    Muscle ache/ pain   Dexilant [Dexlansoprazole] Other (See Comments)    Muscle aches   Ivp Dye [Iodinated Contrast Media] Rash   Soma [Carisoprodol] Other (See Comments)    Sores on arm   Sulfa Antibiotics Rash    Social History   Socioeconomic History   Marital status: Divorced    Spouse name: Not on file   Number of children: 0   Years of education: Not on file   Highest education level: Not on file  Occupational History   Occupation: retired    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[place] [employer]    Comment: disabled now   Occupation: retired  Tobacco Use   Smoking status: Never   Smokeless tobacco: Never  Vaping Use   Vaping status: Never Used  Substance and Sexual Activity   Alcohol use: No    Alcohol/week: 0.0  standard drinks of alcohol   Drug use: No   Sexual activity: Never  Other Topics Concern   Not on file  Social History Narrative   Goes by "David Armstrong"   Divorced   No children   Retired from  the post office   Has internet girlfriend on facebook from [place]   Social Determinants of Health   Financial Resource Strain: Low Risk  (12/02/2021)   Overall Financial Resource Strain (CARDIA)    Difficulty of Paying Living Expenses: Not hard at all  Food Insecurity: Unknown (02/21/2022)   Hunger Vital Sign    Worried About Running Out of Food in the Last Year: Never true    Ran Out of Food in the Last Year: Not on file  Transportation Needs: No Transportation Needs (02/21/2022)   PRAPARE - Administrator, Civil Service (Medical): No    Lack of Transportation (Non-Medical): No  Physical Activity: Insufficiently Active (11/19/2020)   Exercise Vital Sign    Days of Exercise per Week: 4 days    Minutes of Exercise per Session: 30 min  Stress: No Stress Concern Present (12/02/2021)   [employer] of Occupational Health - Occupational Stress Questionnaire    Feeling of Stress : Only a little  Social Connections: Socially Isolated (12/02/2021)   Social Connection and Isolation Panel [NHANES]    Frequency of Communication with Friends and Family: Three times a week    Frequency of Social Gatherings with Friends and Family: Never    Attends Religious Services: Never    Database administrator or Organizations: No    Attends Engineer, structural: Never    Marital Status: Divorced          Objective:  Physical Exam: BP 110/70   Pulse (!) 52   Temp 97.8 F (36.6 C) (Temporal)   Ht 5\' 8"  (1.727 m)   Wt 136 lb 12.8 oz (62.1 kg)   SpO2 99%   BMI 20.80 kg/m   Gen: No acute distress, resting comfortably HEENT: tympanic membranes clear.  CV: Regular rate and rhythm with no murmurs appreciated Pulm: Normal work of breathing, clear to auscultation bilaterally with no crackles,  wheezes, or rhonchi Neuro: Grossly normal, moves all extremities Psych: Normal affect and thought content  Time Spent: 45 minutes of total time was spent on the date of the encounter performing the following actions: chart review prior to seeing the patient, obtaining history, performing a medically necessary exam, counseling on the treatment plan, placing orders, and documenting in our EHR.        Katina Degree. Jimmey Ralph, MD 03/17/2023 11:07 AM

## 2023-03-17 NOTE — Patient Instructions (Signed)
It was very nice to see you today!  We will check blood work today.  Please continue work on diet and exercise.  Will see back in year for your next physical.  Come back sooner if needed.  Return in about 1 year (around 03/16/2024) for Annual Physical.   Take care, Dr Jimmey Ralph  PLEASE NOTE:  If you had any lab tests, please let us know if you have not heard back within a few days. You may see your results on mychart before we have a chance to review them but we will give you a call once they are reviewed by [place].   If we ordered any referrals today, please let us know if you have not heard from their office within the next week.   If you had any urgent prescriptions sent in today, please check with the pharmacy within an hour of our visit to make sure the prescription was transmitted appropriately.   Please try these tips to maintain a healthy lifestyle:  Eat at least 3 REAL meals and 1-2 snacks per day.  Aim for no more than 5 hours between eating.  If you eat breakfast, please do so within one hour of getting up.   Each meal should contain half fruits/vegetables, one quarter protein, and one quarter carbs (no bigger than a computer mouse)  Cut down on sweet beverages. This includes juice, soda, and sweet tea.   Drink at least 1 glass of water with each meal and aim for at least 8 glasses per day  Exercise at least 150 minutes every week.    Preventive Care 76 Years and Older, Male Preventive care refers to lifestyle choices and visits with your health care provider that can promote health and wellness. Preventive care visits are also called wellness exams. What can I expect for my preventive care visit? Counseling During your preventive care visit, your health care provider may ask about your: Medical history, including: Past medical problems. Family medical history. History of falls. Current health, including: Emotional well-being. Home life and relationship  well-being. Sexual activity. Memory and ability to understand (cognition). Lifestyle, including: Alcohol, nicotine or tobacco, and drug use. Access to firearms. Diet, exercise, and sleep habits. Work and work Astronomer. Sunscreen use. Safety issues such as seatbelt and bike helmet use. Physical exam Your health care provider will check your: Height and weight. These may be used to calculate your BMI (body mass index). BMI is a measurement that tells if you are at a healthy weight. Waist circumference. This measures the distance around your waistline. This measurement also tells if you are at a healthy weight and may help predict your risk of certain diseases, such as type 2 diabetes and high blood pressure. Heart rate and blood pressure. Body temperature. Skin for abnormal spots. What immunizations do I need?  Vaccines are usually given at various ages, according to a schedule. Your health care provider will recommend vaccines for you based on your age, medical history, and lifestyle or other factors, such as travel or where you work. What tests do I need? Screening Your health care provider may recommend screening tests for certain conditions. This may include: Lipid and cholesterol levels. Diabetes screening. This is done by checking your blood sugar (glucose) after you have not eaten for a while (fasting). Hepatitis C test. Hepatitis B test. HIV (human immunodeficiency virus) test. STI (sexually transmitted infection) testing, if you are at risk. Lung cancer screening. Colorectal cancer screening. Prostate cancer screening. Abdominal aortic  aneurysm (AAA) screening. You may need this if you are a current or former smoker. Talk with your health care provider about your test results, treatment options, and if necessary, the need for more tests. Follow these instructions at home: Eating and drinking  Eat a diet that includes fresh fruits and vegetables, whole grains, lean  protein, and low-fat dairy products. Limit your intake of foods with high amounts of sugar, saturated fats, and salt. Take vitamin and mineral supplements as recommended by your health care provider. Do not drink alcohol if your health care provider tells you not to drink. If you drink alcohol: Limit how much you have to 0-2 drinks a day. Know how much alcohol is in your drink. In the [place] one drink equals one 12 oz bottle of beer (355 mL), one 5 oz glass of wine (148 mL), or one 1 oz glass of hard liquor (44 mL). Lifestyle Brush your teeth every morning and night with fluoride toothpaste. Floss one time each day. Exercise for at least 30 minutes 5 or more days each week. Do not use any products that contain nicotine or tobacco. These products include cigarettes, chewing tobacco, and vaping devices, such as e-cigarettes. If you need help quitting, ask your health care provider. Do not use drugs. If you are sexually active, practice safe sex. Use a condom or other form of protection to prevent STIs. Take aspirin only as told by your health care provider. Make sure that you understand how much to take and what form to take. Work with your health care provider to find out whether it is safe and beneficial for you to take aspirin daily. Ask your health care provider if you need to take a cholesterol-lowering medicine (statin). Find healthy ways to manage stress, such as: Meditation, yoga, or listening to music. Journaling. Talking to a trusted person. Spending time with friends and family. Safety Always wear your seat belt while driving or riding in a vehicle. Do not drive: If you have been drinking alcohol. Do not ride with someone who has been drinking. When you are tired or distracted. While texting. If you have been using any mind-altering substances or drugs. Wear a helmet and other protective equipment during sports activities. If you have firearms in your house, make sure you follow  all gun safety procedures. Minimize exposure to UV radiation to reduce your risk of skin cancer. What's next? Visit your health care provider once a year for an annual wellness visit. Ask your health care provider how often you should have your eyes and teeth checked. Stay up to date on all vaccines. This information is not intended to replace advice given to you by your health care provider. Make sure you discuss any questions you have with your health care provider. Document Revised: 09/26/2020 Document Reviewed: 09/26/2020 Elsevier Patient Education  2024 [employer].

## 2023-03-18 ENCOUNTER — Ambulatory Visit (INDEPENDENT_AMBULATORY_CARE_PROVIDER_SITE_OTHER): Payer: Medicare Other

## 2023-03-18 VITALS — Wt 136.0 lb

## 2023-03-18 DIAGNOSIS — Z0000 Encounter for general adult medical examination without abnormal findings: Secondary | ICD-10-CM

## 2023-03-18 NOTE — Patient Instructions (Signed)
Mr. Street , Thank you for taking time to come for your Medicare Wellness Visit. I appreciate your ongoing commitment to your health goals. Please review the following plan we discussed and let me know if I can assist you in the future.   Referrals/Orders/Follow-Ups/Clinician Recommendations: Aim for 30 minutes of exercise or brisk walking, 6-8 glasses of water, and 5 servings of fruits and vegetables each day.   This is a list of the screening recommended for you and due dates:  Health Maintenance  Topic Date Due   Medicare Annual Wellness Visit  12/03/2022   COVID-19 Vaccine (7 - 2023-24 season) 03/22/2023*   Colon Cancer Screening  02/23/2026   DTaP/Tdap/Td vaccine (3 - Td or Tdap) 06/04/2026   Pneumonia Vaccine  Completed   Flu Shot  Completed   Hepatitis C Screening  Completed   Zoster (Shingles) Vaccine  Completed   HPV Vaccine  Aged Out  *Topic was postponed. The date shown is not the original due date.    Advanced directives: (Declined) Advance directive discussed with you today. Even though you declined this today, please call our office should you change your mind, and we can give you the proper paperwork for you to fill out.  Next Medicare Annual Wellness Visit scheduled for next year: Yes

## 2023-03-18 NOTE — Progress Notes (Signed)
Subjective:   David Armstrong is a 72 y.o. male who presents for Medicare Annual/Subsequent preventive examination.  Visit Complete: Virtual I connected with  Dorina Hoyer on 03/18/23 by a audio enabled telemedicine application and verified that I am speaking with the correct person using two identifiers.  Patient Location: Home  Provider Location: Home Office  I discussed the limitations of evaluation and management by telemedicine. The patient expressed understanding and agreed to proceed.  Vital Signs: Because this visit was a virtual/telehealth visit, some criteria may be missing or patient reported. Any vitals not documented were not able to be obtained and vitals that have been documented are patient reported.  Cardiac Risk Factors include: advanced age (>81men, >51 women);dyslipidemia     Objective:    Today's Vitals   03/18/23 1122  Weight: 136 lb (61.7 kg)   Body mass index is 20.68 kg/m.     03/18/2023   11:26 AM 02/26/2023   11:12 PM 11/24/2022    9:41 AM 09/22/2022    9:40 AM 09/05/2022    9:52 AM 05/25/2022   12:44 PM 04/11/2022    4:12 PM  Advanced Directives  Does Patient Have a Medical Advance Directive? No No No No No No No  Would patient like information on creating a medical advance directive? No - Patient declined No - Patient declined No - Patient declined No - Patient declined No - Patient declined  No - Patient declined    Current Medications (verified) Outpatient Encounter Medications as of 03/18/2023  Medication Sig   baclofen (LIORESAL) 10 MG tablet Take 0.5-1 tablets (5-10 mg total) by mouth 3 (three) times daily.   desonide (DESOWEN) 0.05 % cream Apply topically 2 (two) times daily.   desonide (DESOWEN) 0.05 % lotion Apply topically 2 (two) times daily.   fluticasone (FLONASE) 50 MCG/ACT nasal spray Place 2 sprays into both nostrils daily. USE twice a day for 1 week, then reduce to using daily.   hyoscyamine (LEVSIN SL) 0.125 MG SL tablet  DISSOLVE 2 TABLETS UNDER THE TONGUE EVERY 6 HOURS AS NEEDED   LORazepam (ATIVAN) 1 MG tablet Take 0.5-1 tablets (0.5-1 mg total) by mouth every 8 (eight) hours as needed for anxiety.   omeprazole (PRILOSEC) 40 MG capsule Take 1 capsule (40 mg total) by mouth daily.   rivaroxaban (XARELTO) 20 MG TABS tablet Take 1 tablet (20 mg total) by mouth daily with supper. START this only after you have completed the starter pack.   senna-docusate (SENOKOT-S) 8.6-50 MG tablet Take 1 tablet by mouth at bedtime as needed for mild constipation.   tiZANidine (ZANAFLEX) 4 MG tablet    traMADol (ULTRAM) 50 MG tablet Take 1 tablet (50 mg total) by mouth 3 (three) times daily as needed for moderate pain (pain score 4-6) or severe pain (pain score 7-10).   triamcinolone cream (KENALOG) 0.1 % Apply 1 Application topically 2 (two) times daily.   venlafaxine XR (EFFEXOR XR) 37.5 MG 24 hr capsule Take 1 capsule (37.5 mg total) by mouth daily with breakfast.   Vitamin D, Ergocalciferol, (DRISDOL) 1.25 MG (50000 UNIT) CAPS capsule Take 50,000 Units by mouth once a week.   XDEMVY 0.25 % SOLN Apply to eye.   No facility-administered encounter medications on file as of 03/18/2023.    Allergies (verified) Effexor xr [venlafaxine hcl er], Acyclovir and related, Zocor [simvastatin], Dexilant [dexlansoprazole], Ivp dye [iodinated contrast media], Soma [carisoprodol], and Sulfa antibiotics   History: Past Medical History:  Diagnosis Date  Anxiety    Arthritis    "lower back; hips;' right knee"    Bipolar disorder (HCC)    Chronic constipation    Depression    Diverticulosis of colon    Edema of both lower extremities    ANKLES   Fatty liver    GERD (gastroesophageal reflux disease)    Hiatal hernia    History of anal fissures    History of deep vein thrombosis (DVT) of lower extremity 02/11/2016   right lower extremity distal and proximal extensive acute   History of Helicobacter pylori infection 2012; 2009    History of kidney stones    History of pneumonia 01/2015   CAP   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    Hyperlipidemia    IBS (irritable bowel syndrome)    Left ureteral stone    Nocturia    Peripheral neuropathy    per pt due to back DDD   Personal history of colonic polyps    HYPERPLASTIC POLYP   Pulmonary nodule, right    incidental finding CT chest angio 02-11-2016 stable   TMJ (temporomandibular joint syndrome)    Varicosities of leg    Past Surgical History:  Procedure Laterality Date   BELPHAROPTOSIS REPAIR Bilateral 01/2015   CATARACT EXTRACTION W/ INTRAOCULAR LENS  IMPLANT, BILATERAL  04/2016   COLONOSCOPY WITH ESOPHAGOGASTRODUODENOSCOPY (EGD)  last one 03-02-2013   CYSTOSCOPY/RETROGRADE/URETEROSCOPY/STONE EXTRACTION WITH BASKET  2010   CYSTOSCOPY/URETEROSCOPY/HOLMIUM LASER/STENT PLACEMENT Left 03/16/2017   Procedure: CYSTOSCOPY/RETROGRADE/URETEROSCOPY/HOLMIUM LASER/STENT PLACEMENT, STONE BASKET EXTRACTION AND STENT PLACEMENT;  Surgeon: Ihor Gully, MD;  Location: [facility];  Service: Urology;  Laterality: Left;   EXCISIONAL HEMORRHOIDECTOMY  2015 approx.   EXTRACORPOREAL SHOCK WAVE LITHOTRIPSY  1987; 1992   EXTRACORPOREAL SHOCK WAVE LITHOTRIPSY Left 09/05/2022   Procedure: LEFT EXTRACORPOREAL SHOCK WAVE LITHOTRIPSY (ESWL);  Surgeon: Jannifer Hick, MD;  Location[facility];  Service: Urology;  Laterality: Left;   EXTRACORPOREAL SHOCK WAVE LITHOTRIPSY Right 09/22/2022   Procedure: RIGHT EXTRACORPOREAL SHOCK WAVE LITHOTRIPSY (ESWL);  Surgeon: Marcine Matar, MD;  Location: [facility];  Service: Urology;  Laterality: Right;   EXTRACORPOREAL SHOCK WAVE LITHOTRIPSY Left 11/24/2022   Procedure: LEFT EXTRACORPOREAL SHOCK WAVE LITHOTRIPSY (ESWL);  Surgeon: Rene Paci, MD;  Location[facility];  Service: Urology;  Laterality: Left;   FOOT NEUROMA SURGERY Bilateral left  09/2015; right 04/ 2017   morton' neuroma   FOOT NEUROMA SURGERY Right 2004   morton's neuroma and removal heel spur   HAMMER TOE SURGERY Left 2013   5th toe   INCISION AND DRAINAGE FOOT Left 1992   "ball of foot; stepped on piece of hard wire & it went thru my shoe"   IR ANGIOGRAM PULMONARY BILATERAL SELECTIVE  12/24/2021   IR ANGIOGRAM SELECTIVE EACH ADDITIONAL VESSEL  12/24/2021   IR ANGIOGRAM SELECTIVE EACH ADDITIONAL VESSEL  12/24/2021   IR INFUSION THROMBOL ARTERIAL INITIAL (MS)  12/24/2021   IR INFUSION THROMBOL ARTERIAL INITIAL (MS)  12/24/2021   IR THROMB F/U EVAL ART/VEN FINAL DAY (MS)  12/25/2021   IR US GUIDE VASC ACCESS RIGHT  12/24/2021   KNEE ARTHROSCOPY Bilateral left x2 1997;  right 2004 & 2005   NASAL FRACTURE SURGERY  1997   PATELLA-FEMORAL ARTHROPLASTY Right 07-06-2003   dr Renae Fickle [facility]   PLANTAR FASCIA RELEASE Bilateral 1987 & 1993   RHINOPLASTY  2000   SHOULDER ARTHROSCOPY Left 2012;  2013   SHOULDER  ARTHROSCOPY WITH DISTAL CLAVICLE RESECTION Right 07-31-2005   dr Eulah Pont  [facility]   w/ Debridement labral and adhesions, acromioplasty, bursectomy, and CA ligament release   TRANSTHORACIC ECHOCARDIOGRAM  02/12/2016   ef 65-70%, grade 1 diastolic dysfunction/  trivial TR   Family History  Problem Relation Age of Onset   Heart disease Mother    Stroke Mother    Cirrhosis Father    Heart disease Father    Skin cancer Father    Heart disease Brother    Prostate cancer Maternal Grandfather    Parkinson's disease Paternal Grandfather    Colon cancer Neg Hx    Stomach cancer Neg Hx    Esophageal cancer Neg Hx    Rectal cancer Neg Hx    Social History   Socioeconomic History   Marital status: Divorced    Spouse name: Not on file   Number of children: 0   Years of education: Not on file   Highest education level: Not on file  Occupational History   Occupation: retired    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[employer]    Comment: disabled now   Occupation: retired  Tobacco Use   Smoking  status: Never   Smokeless tobacco: Never  Vaping Use   Vaping status: Never Used  Substance and Sexual Activity   Alcohol use: No    Alcohol/week: 0.0 standard drinks of alcohol   Drug use: No   Sexual activity: Never  Other Topics Concern   Not on file  Social History Narrative   Goes by "[name]"   Divorced   No children   Retired from the post office   Has internet girlfriend on facebook from [place]   Social Determinants of Health   Financial Resource Strain: Low Risk  (03/18/2023)   Overall Financial Resource Strain (CARDIA)    Difficulty of Paying Living Expenses: Not hard at all  Food Insecurity: No Food Insecurity (03/18/2023)   Hunger Vital Sign    Worried About Running Out of Food in the Last Year: Never true    Ran Out of Food in the Last Year: Never true  Transportation Needs: No Transportation Needs (03/18/2023)   PRAPARE - Administrator, Civil Service (Medical): No    Lack of Transportation (Non-Medical): No  Physical Activity: Sufficiently Active (03/18/2023)   Exercise Vital Sign    Days of Exercise per Week: 7 days    Minutes of Exercise per Session: 60 min  Stress: No Stress Concern Present (03/18/2023)   [employer] of Occupational Health - Occupational Stress Questionnaire    Feeling of Stress : Only a little  Social Connections: Socially Isolated (03/18/2023)   Social Connection and Isolation Panel [NHANES]    Frequency of Communication with Friends and Family: More than three times a week    Frequency of Social Gatherings with Friends and Family: Never    Attends Religious Services: Never    Database administrator or Organizations: No    Attends Engineer, structural: Never    Marital Status: Divorced    Tobacco Counseling Counseling given: Not Answered   Clinical Intake:  Pre-visit preparation completed: Yes  Pain : No/denies pain     Nutritional Status: BMI of 19-24  Normal Diabetes: No  How often do you need to have  someone help you when you read instructions, pamphlets, or other written materials from your doctor or pharmacy?: 1 - Never  Interpreter Needed?: No  Information entered by :: Lanier Ensign,  LPN   Activities of Daily Living    03/18/2023   11:23 AM 11/24/2022    9:43 AM  In your present state of health, do you have any difficulty performing the following activities:  Hearing? 0 0  Vision? 0 0  Difficulty concentrating or making decisions? 0 0  Walking or climbing stairs? 0 0  Dressing or bathing? 0 0  Doing errands, shopping? 0   Preparing Food and eating ? N   Using the Toilet? N   In the past six months, have you accidently leaked urine? N   Do you have problems with loss of bowel control? N   Managing your Medications? N   Managing your Finances? N   Housekeeping or managing your Housekeeping? N     Patient Care Team: Ardith Dark, MD as PCP - General (Family Medicine) Karie Soda, MD as Consulting Physician (General Surgery) Mardella Layman, MD as Consulting Physician (Gastroenterology) Meredith Pel, NP as Nurse Practitioner (Gastroenterology) Ihor Gully, MD (Inactive) as Consulting Physician (Urology) Milagros Evener, MD as Consulting Physician (Psychiatry) Ronne Binning Mardene Celeste, MD as Consulting Physician (Urology)  Indicate any recent Medical Services you may have received from other than [facility] providers in the past year (date may be approximate).     Assessment:   This is a routine wellness examination for Tavious.  Hearing/Vision screen Hearing Screening - Comments:: Pt denies any hearing issues  Vision Screening - Comments:: Pt follows up with [facility]    Goals Addressed             This Visit's Progress    Patient Stated       Work on back        Depression Screen    03/18/2023   11:27 AM 03/17/2023   10:01 AM 12/26/2022   11:09 AM 10/02/2022   11:15 AM 07/24/2022   11:13 AM 06/09/2022    2:11 PM 05/20/2022   10:04 AM  PHQ 2/9  Scores  PHQ - 2 Score 4 4 3 4 2 2 2   PHQ- 9 Score 16 15 11 10 7 4 8     Fall Risk    03/18/2023   11:30 AM 03/17/2023   10:01 AM 03/06/2023   10:19 AM 12/26/2022   11:09 AM 10/02/2022   10:20 AM  Fall Risk   Falls in the past year? 1 1 1  0 0  Number falls in past yr: 1 0 0 0 0  Injury with Fall? 1 0 0 0 0  Risk for fall due to : History of fall(s) No Fall Risks  No Fall Risks No Fall Risks  Follow up Falls prevention discussed        MEDICARE RISK AT HOME: Medicare Risk at Home Any stairs in or around the home?: Yes If so, are there any without handrails?: No Home free of loose throw rugs in walkways, pet beds, electrical cords, etc?: Yes Adequate lighting in your home to reduce risk of falls?: Yes Life alert?: No Use of a cane, walker or w/c?: No Grab bars in the bathroom?: Yes Shower chair or bench in shower?: Yes Elevated toilet seat or a handicapped toilet?: No  TIMED UP AND GO:  Was the test performed?  No    Cognitive Function:        03/18/2023   11:31 AM 12/02/2021    1:46 PM 11/19/2020    1:12 PM 11/14/2019    1:26 PM  6CIT Screen  What Year?  0 points 0 points 0 points   What month? 0 points 0 points 0 points   What time? 0 points 0 points 0 points 0 points  Count back from 20 0 points 0 points 0 points 0 points  Months in reverse 0 points 0 points 0 points 0 points  Repeat phrase 0 points 0 points 0 points 4 points  Total Score 0 points 0 points 0 points     Immunizations Immunization History  Administered Date(s) Administered   Fluad Quad(high Dose 65+) 12/25/2020, 01/14/2022   Fluad Trivalent(High Dose 65+) 12/26/2022   Influenza Split 12/22/2012, 01/13/2016   Influenza, High Dose Seasonal PF 01/19/2018, 12/02/2018   Influenza,inj,Quad PF,6+ Mos 01/07/2014   Influenza-Unspecified 02/12/2015, 01/13/2016, 12/30/2019, 12/13/2020   Moderna Covid-19 Fall Seasonal Vaccine 13yrs & older 01/14/2022   Moderna Covid-19 Vaccine Bivalent Booster 78yrs & up  01/09/2021   Moderna Sars-Covid-2 Vaccination 06/15/2019, 07/13/2019, 02/14/2020, 07/31/2020   Pneumococcal Conjugate-13 08/05/2016   Pneumococcal Polysaccharide-23 01/19/2018   Tdap 11/29/2012, 06/04/2016   Zoster Recombinant(Shingrix) 07/13/2016, 09/12/2016   Zoster, Live 11/29/2012    TDAP status: Up to date  Flu Vaccine status: Up to date  Pneumococcal vaccine status: Up to date  Covid-19 vaccine status: Information provided on how to obtain vaccines.   Qualifies for Shingles Vaccine? Yes   Zostavax completed Yes   Shingrix Completed?: Yes  Screening Tests Health Maintenance  Topic Date Due   COVID-19 Vaccine (7 - 2023-24 season) 03/22/2023 (Originally 12/14/2022)   Medicare Annual Wellness (AWV)  03/17/2024   Colonoscopy  02/23/2026   DTaP/Tdap/Td (3 - Td or Tdap) 06/04/2026   Pneumonia Vaccine 39+ Years old  Completed   INFLUENZA VACCINE  Completed   Hepatitis C Screening  Completed   Zoster Vaccines- Shingrix  Completed   HPV VACCINES  Aged Out    Health Maintenance  There are no preventive care reminders to display for this patient.   Colorectal cancer screening: Type of screening: Colonoscopy. Completed 02/24/23. Repeat every 3 years   Additional Screening:  Hepatitis C Screening:  Completed 11/24/16  Vision Screening: Recommended annual ophthalmology exams for early detection of glaucoma and other disorders of the eye. Is the patient up to date with their annual eye exam?  Yes  Who is the provider or what is the name of the office in which the patient attends annual eye exams? [facility]  If pt is not established with a provider, would they like to be referred to a provider to establish care? No .   Dental Screening: Recommended annual dental exams for proper oral hygiene  Community Resource Referral / Chronic Care Management: CRR required this visit?  No   CCM required this visit?  No     Plan:     I have personally reviewed and noted the  following in the patient's chart:   Medical and social history Use of alcohol, tobacco or illicit drugs  Current medications and supplements including opioid prescriptions. Patient is currently taking opioid prescriptions. Information provided to patient regarding non-opioid alternatives. Patient advised to discuss non-opioid treatment plan with their provider. Functional ability and status Nutritional status Physical activity Advanced directives List of other physicians Hospitalizations, surgeries, and ER visits in previous 12 months Vitals Screenings to include cognitive, depression, and falls Referrals and appointments  In addition, I have reviewed and discussed with patient certain preventive protocols, quality metrics, and best practice recommendations. A written personalized care plan for preventive services as well as general preventive health  recommendations were provided to patient.     Marzella Schlein, LPN   14/10/8293   After Visit Summary: (MyChart) Due to this being a telephonic visit, the after visit summary with patients personalized plan was offered to patient via MyChart   Nurse Notes: Pt declined any feeling of wanting to harm self, also declined any counseling services at this time.

## 2023-03-19 DIAGNOSIS — G5603 Carpal tunnel syndrome, bilateral upper limbs: Secondary | ICD-10-CM | POA: Diagnosis not present

## 2023-03-19 DIAGNOSIS — G2581 Restless legs syndrome: Secondary | ICD-10-CM | POA: Diagnosis not present

## 2023-03-19 DIAGNOSIS — M5412 Radiculopathy, cervical region: Secondary | ICD-10-CM | POA: Diagnosis not present

## 2023-03-19 DIAGNOSIS — M5417 Radiculopathy, lumbosacral region: Secondary | ICD-10-CM | POA: Diagnosis not present

## 2023-03-19 DIAGNOSIS — G603 Idiopathic progressive neuropathy: Secondary | ICD-10-CM | POA: Diagnosis not present

## 2023-03-19 NOTE — Progress Notes (Signed)
B12 is very low.  Please have him come here to start B12 protocol.  We should recheck this again in a few months.  Cholesterol is much better than we checked a couple of years ago.  He may benefit from starting a low-dose statin to improve his numbers further and low risk heart attack and stroke.  Please send in Lipitor 20 mg daily he is agreeable to start.  Regardless he should continue to work on diet and exercise and we can recheck this again in a year or so.  He did have a lot of blood in his urine which is probably due to his kidney stones.  Recommended he follow-up with urology soon.  Rest with labs are all stable and we can recheck again in a year.

## 2023-03-25 DIAGNOSIS — D492 Neoplasm of unspecified behavior of bone, soft tissue, and skin: Secondary | ICD-10-CM | POA: Diagnosis not present

## 2023-03-25 DIAGNOSIS — C4402 Squamous cell carcinoma of skin of lip: Secondary | ICD-10-CM | POA: Diagnosis not present

## 2023-03-26 ENCOUNTER — Ambulatory Visit (INDEPENDENT_AMBULATORY_CARE_PROVIDER_SITE_OTHER): Payer: Medicare Other

## 2023-03-26 DIAGNOSIS — E538 Deficiency of other specified B group vitamins: Secondary | ICD-10-CM

## 2023-03-26 MED ORDER — CYANOCOBALAMIN 1000 MCG/ML IJ SOLN
1000.0000 ug | Freq: Once | INTRAMUSCULAR | Status: AC
  Administered 2023-03-26: 1000 ug via INTRAMUSCULAR

## 2023-03-26 NOTE — Progress Notes (Signed)
.  Patient is in office today for a nurse visit for B12 Injection per Jimmey Ralph. Patient Injection was given in the  Left deltoid. Patient tolerated injection well.

## 2023-03-31 ENCOUNTER — Other Ambulatory Visit: Payer: Self-pay | Admitting: Family Medicine

## 2023-03-31 DIAGNOSIS — J309 Allergic rhinitis, unspecified: Secondary | ICD-10-CM

## 2023-04-01 DIAGNOSIS — R3911 Hesitancy of micturition: Secondary | ICD-10-CM | POA: Diagnosis not present

## 2023-04-01 DIAGNOSIS — N401 Enlarged prostate with lower urinary tract symptoms: Secondary | ICD-10-CM | POA: Diagnosis not present

## 2023-04-01 DIAGNOSIS — N200 Calculus of kidney: Secondary | ICD-10-CM | POA: Diagnosis not present

## 2023-04-01 DIAGNOSIS — R311 Benign essential microscopic hematuria: Secondary | ICD-10-CM | POA: Diagnosis not present

## 2023-04-01 DIAGNOSIS — R1084 Generalized abdominal pain: Secondary | ICD-10-CM | POA: Diagnosis not present

## 2023-04-02 ENCOUNTER — Ambulatory Visit (INDEPENDENT_AMBULATORY_CARE_PROVIDER_SITE_OTHER): Payer: Medicare Other | Admitting: *Deleted

## 2023-04-02 DIAGNOSIS — E538 Deficiency of other specified B group vitamins: Secondary | ICD-10-CM

## 2023-04-02 MED ORDER — CYANOCOBALAMIN 1000 MCG/ML IJ SOLN
1000.0000 ug | Freq: Once | INTRAMUSCULAR | Status: AC
  Administered 2023-04-02: 1000 ug via INTRAMUSCULAR

## 2023-04-02 NOTE — Progress Notes (Signed)
Per orders of Dr. Jimmey Ralph, injection of 2nd weekly B 12 given in right deltoid per patient preference by Jobe Gibbon, CMA. Patient tolerated injection well. Patient reminded to schedule next injection.

## 2023-04-02 NOTE — Progress Notes (Signed)
I have reviewed the patient's encounter and agree with the documentation.  Katina Degree. Jimmey Ralph, MD 04/02/2023 3:15 PM

## 2023-04-09 ENCOUNTER — Ambulatory Visit (INDEPENDENT_AMBULATORY_CARE_PROVIDER_SITE_OTHER): Payer: Medicare Other

## 2023-04-09 DIAGNOSIS — E538 Deficiency of other specified B group vitamins: Secondary | ICD-10-CM

## 2023-04-09 MED ORDER — CYANOCOBALAMIN 1000 MCG/ML IJ SOLN
1000.0000 ug | Freq: Once | INTRAMUSCULAR | Status: AC
  Administered 2023-04-09: 1000 ug via INTRAMUSCULAR

## 2023-04-09 NOTE — Progress Notes (Signed)
Patient is in office today for a nurse visit for B12 Injection, per PCP's order. Patient Injection was given in the  Left deltoid. Patient tolerated injection well.

## 2023-04-16 ENCOUNTER — Ambulatory Visit (INDEPENDENT_AMBULATORY_CARE_PROVIDER_SITE_OTHER): Payer: Medicare Other

## 2023-04-16 DIAGNOSIS — E538 Deficiency of other specified B group vitamins: Secondary | ICD-10-CM

## 2023-04-16 MED ORDER — CYANOCOBALAMIN 1000 MCG/ML IJ SOLN
1000.0000 ug | Freq: Once | INTRAMUSCULAR | Status: AC
  Administered 2023-04-16: 1000 ug via INTRAMUSCULAR

## 2023-04-16 NOTE — Progress Notes (Signed)
 Per orders of Dr. Jacquiline Doe, injection of B-12 given by Dorris Fetch in right deltoid. Patient tolerated injection well. Patient will make appointment for 1 month.

## 2023-04-17 DIAGNOSIS — K5730 Diverticulosis of large intestine without perforation or abscess without bleeding: Secondary | ICD-10-CM | POA: Diagnosis not present

## 2023-04-17 DIAGNOSIS — N200 Calculus of kidney: Secondary | ICD-10-CM | POA: Diagnosis not present

## 2023-04-17 DIAGNOSIS — R311 Benign essential microscopic hematuria: Secondary | ICD-10-CM | POA: Diagnosis not present

## 2023-04-29 DIAGNOSIS — N200 Calculus of kidney: Secondary | ICD-10-CM | POA: Diagnosis not present

## 2023-04-29 DIAGNOSIS — R8289 Other abnormal findings on cytological and histological examination of urine: Secondary | ICD-10-CM | POA: Diagnosis not present

## 2023-05-01 ENCOUNTER — Other Ambulatory Visit: Payer: Self-pay | Admitting: Family Medicine

## 2023-05-05 ENCOUNTER — Other Ambulatory Visit: Payer: Self-pay | Admitting: Family Medicine

## 2023-05-06 NOTE — Telephone Encounter (Signed)
Last refill by historical provider  11 month ago

## 2023-05-11 ENCOUNTER — Encounter: Payer: 59 | Admitting: Internal Medicine

## 2023-05-11 ENCOUNTER — Telehealth: Payer: Self-pay | Admitting: *Deleted

## 2023-05-11 NOTE — Telephone Encounter (Signed)
Patient didn't hold Xarelto 2 days prior to EGD.  Rescheduled patient to 06/29/23 and instructions will be sent via mychart per patient request.

## 2023-05-20 DIAGNOSIS — C4402 Squamous cell carcinoma of skin of lip: Secondary | ICD-10-CM | POA: Diagnosis not present

## 2023-06-01 DIAGNOSIS — D225 Melanocytic nevi of trunk: Secondary | ICD-10-CM | POA: Diagnosis not present

## 2023-06-01 DIAGNOSIS — L570 Actinic keratosis: Secondary | ICD-10-CM | POA: Diagnosis not present

## 2023-06-01 DIAGNOSIS — L821 Other seborrheic keratosis: Secondary | ICD-10-CM | POA: Diagnosis not present

## 2023-06-01 DIAGNOSIS — L814 Other melanin hyperpigmentation: Secondary | ICD-10-CM | POA: Diagnosis not present

## 2023-06-03 ENCOUNTER — Ambulatory Visit: Payer: Medicare Other

## 2023-06-08 ENCOUNTER — Ambulatory Visit: Payer: Self-pay | Admitting: Family Medicine

## 2023-06-08 NOTE — Telephone Encounter (Signed)
 Copied from CRM [id number]. Topic: Clinical - Red Word Triage >> Jun 08, 2023 12:03 PM Gurney Maxin H wrote: [facility] that prompted transfer to Nurse Triage: Pain in lower right leg concerned it might me a blood clot again. Reason for Disposition  [1] Thigh or calf pain AND [2] only 1 side AND [3] present > 1 hour (Exception: Chronic unchanged pain.)  Answer Assessment - Initial Assessment Questions 1. ONSET: "When did the pain start?"      I'm having pain in my right lower calf and up to the back of my knee.    2. LOCATION: "Where is the pain located?"      It's hurting bad in my calf from my knee down.  3. PAIN: "How bad is the pain?"    (Scale 1-10; or mild, moderate, severe)   -  MILD (1-3): doesn't interfere with normal activities    -  MODERATE (4-7): interferes with normal activities (e.g., work or school) or awakens from sleep, limping    -  SEVERE (8-10): excruciating pain, unable to do any normal activities, unable to walk     It hurts It's swollen some.    I wear compresssion socks.   It hurts to touch my calf.   4. WORK OR EXERCISE: "Has there been any recent work or exercise that involved this part of the body?"      No I have DVTs in this leg.   I've had PE.   5. CAUSE: "What do you think is causing the leg pain?"     I hoping it's not a blood.   I'm on Xalerlto.   6. OTHER SYMPTOMS: "Do you have any other symptoms?" (e.g., chest pain, back pain, breathing difficulty, swelling, rash, fever, numbness, weakness)     It's warmer.   Very tender to the touch.   Can [facility] be scheduled at [facility] instead of the ED?    7. PREGNANCY: "Is there any chance you are pregnant?" "When was your last menstrual period?"     N/A  Protocols used: Leg Pain-A-AH  Chief Complaint: No appts with the practice so referred to the ED.  He wanted to have the U/S scheduled at [facility]. But none of the providers have openings for today. Symptoms: Right calf warm, tender, sore and swollen.    Frequency:  Today Pertinent Negatives: Patient denies N/A Disposition: [x] ED /[] Urgent Care (no appt availability in office) / [] Appointment(In office/virtual)/ []  [place] [facility]/ [] Home Care/ [] Refused Recommended Disposition /[] [place] [facility]/ []  Follow-up with PCP Additional Notes: Pt. Finally agreeable to going to the ED.

## 2023-06-10 ENCOUNTER — Ambulatory Visit (INDEPENDENT_AMBULATORY_CARE_PROVIDER_SITE_OTHER): Payer: Medicare Other

## 2023-06-10 DIAGNOSIS — E538 Deficiency of other specified B group vitamins: Secondary | ICD-10-CM

## 2023-06-10 MED ORDER — CYANOCOBALAMIN 1000 MCG/ML IJ SOLN
1000.0000 ug | Freq: Once | INTRAMUSCULAR | Status: AC
  Administered 2023-06-10: 1000 ug via INTRAMUSCULAR

## 2023-06-10 NOTE — Progress Notes (Signed)
 Patient is in office today for a nurse visit for B12 Injection. Patient Injection was given in the  Left deltoid. Patient tolerated injection well.

## 2023-06-18 DIAGNOSIS — G5603 Carpal tunnel syndrome, bilateral upper limbs: Secondary | ICD-10-CM | POA: Diagnosis not present

## 2023-06-18 DIAGNOSIS — M7912 Myalgia of auxiliary muscles, head and neck: Secondary | ICD-10-CM | POA: Diagnosis not present

## 2023-06-18 DIAGNOSIS — M5417 Radiculopathy, lumbosacral region: Secondary | ICD-10-CM | POA: Diagnosis not present

## 2023-06-18 DIAGNOSIS — G2581 Restless legs syndrome: Secondary | ICD-10-CM | POA: Diagnosis not present

## 2023-06-18 DIAGNOSIS — G603 Idiopathic progressive neuropathy: Secondary | ICD-10-CM | POA: Diagnosis not present

## 2023-06-29 ENCOUNTER — Ambulatory Visit (AMBULATORY_SURGERY_CENTER): Payer: Medicare Other | Admitting: Internal Medicine

## 2023-06-29 ENCOUNTER — Encounter: Payer: Self-pay | Admitting: Internal Medicine

## 2023-06-29 VITALS — BP 103/65 | HR 62 | Temp 98.0°F | Resp 13 | Ht 70.0 in | Wt 143.0 lb

## 2023-06-29 DIAGNOSIS — K253 Acute gastric ulcer without hemorrhage or perforation: Secondary | ICD-10-CM | POA: Diagnosis not present

## 2023-06-29 DIAGNOSIS — Q399 Congenital malformation of esophagus, unspecified: Secondary | ICD-10-CM | POA: Diagnosis not present

## 2023-06-29 DIAGNOSIS — K222 Esophageal obstruction: Secondary | ICD-10-CM | POA: Diagnosis not present

## 2023-06-29 DIAGNOSIS — K449 Diaphragmatic hernia without obstruction or gangrene: Secondary | ICD-10-CM | POA: Diagnosis not present

## 2023-06-29 DIAGNOSIS — F419 Anxiety disorder, unspecified: Secondary | ICD-10-CM | POA: Diagnosis not present

## 2023-06-29 DIAGNOSIS — K219 Gastro-esophageal reflux disease without esophagitis: Secondary | ICD-10-CM

## 2023-06-29 DIAGNOSIS — F319 Bipolar disorder, unspecified: Secondary | ICD-10-CM | POA: Diagnosis not present

## 2023-06-29 MED ORDER — SODIUM CHLORIDE 0.9 % IV SOLN
500.0000 mL | Freq: Once | INTRAVENOUS | Status: DC
Start: 2023-06-29 — End: 2023-06-29

## 2023-06-29 NOTE — Progress Notes (Signed)
 GASTROENTEROLOGY PROCEDURE H&P NOTE   Primary Care Physician: Ardith Dark, MD    Reason for Procedure:  History of gastric cardia ulcer and esophageal stricture in the setting of hiatus hernia  Plan:    EGD  Patient is appropriate for endoscopic procedure(s) in the ambulatory ([facility]) setting.  The nature of the procedure, as well as the risks, benefits, and alternatives were carefully and thoroughly reviewed with the patient. Ample time for discussion and questions allowed. The patient understood, was satisfied, and agreed to proceed.     HPI: David Armstrong is a 73 y.o. male who presents for EGD with possible dilation.  Medical history as below.  No recent chest pain or shortness of breath.  No abdominal pain today.  Xarelto on hold x 2 days  Past Medical History:  Diagnosis Date   Anxiety    Arthritis    "lower back; hips;' right knee"    Bipolar disorder (HCC)    Chronic constipation    Depression    Diverticulosis of colon    Edema of both lower extremities    ANKLES   Fatty liver    GERD (gastroesophageal reflux disease)    Hiatal hernia    History of anal fissures    History of deep vein thrombosis (DVT) of lower extremity 02/11/2016   right lower extremity distal and proximal extensive acute   History of Helicobacter pylori infection 2012; 2009   History of kidney stones    History of pneumonia 01/2015   CAP   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    Hyperlipidemia    IBS (irritable bowel syndrome)    Left ureteral stone    Nocturia    Peripheral neuropathy    per pt due to back DDD   Personal history of colonic polyps    HYPERPLASTIC POLYP   Pulmonary nodule, right    incidental finding CT chest angio 02-11-2016 stable   TMJ (temporomandibular joint syndrome)    Varicosities of leg     Past Surgical History:  Procedure Laterality Date   BELPHAROPTOSIS REPAIR Bilateral 01/2015   CATARACT EXTRACTION W/ INTRAOCULAR  LENS  IMPLANT, BILATERAL  04/2016   COLONOSCOPY     COLONOSCOPY WITH ESOPHAGOGASTRODUODENOSCOPY (EGD)  last one 03-02-2013   CYSTOSCOPY/RETROGRADE/URETEROSCOPY/STONE EXTRACTION WITH BASKET  2010   CYSTOSCOPY/URETEROSCOPY/HOLMIUM LASER/STENT PLACEMENT Left 03/16/2017   Procedure: CYSTOSCOPY/RETROGRADE/URETEROSCOPY/HOLMIUM LASER/STENT PLACEMENT, STONE BASKET EXTRACTION AND STENT PLACEMENT;  Surgeon: Ihor Gully, MD;  Location: [facility];  Service: Urology;  Laterality: Left;   EXCISIONAL HEMORRHOIDECTOMY  2015 approx.   EXTRACORPOREAL SHOCK WAVE LITHOTRIPSY  1987; 1992   EXTRACORPOREAL SHOCK WAVE LITHOTRIPSY Left 09/05/2022   Procedure: LEFT EXTRACORPOREAL SHOCK WAVE LITHOTRIPSY (ESWL);  Surgeon: Jannifer Hick, MD;  Location: [facility];  Service: Urology;  Laterality: Left;   EXTRACORPOREAL SHOCK WAVE LITHOTRIPSY Right 09/22/2022   Procedure: RIGHT EXTRACORPOREAL SHOCK WAVE LITHOTRIPSY (ESWL);  Surgeon: Marcine Matar, MD;  Location: [facility];  Service: Urology;  Laterality: Right;   EXTRACORPOREAL SHOCK WAVE LITHOTRIPSY Left 11/24/2022   Procedure: LEFT EXTRACORPOREAL SHOCK WAVE LITHOTRIPSY (ESWL);  Surgeon: Rene Paci, MD;  Location: [facility];  Service: Urology;  Laterality: Left;   FOOT NEUROMA SURGERY Bilateral left 09/2015; right 04/ 2017   morton' neuroma   FOOT NEUROMA SURGERY Right 2004   morton's neuroma and removal heel spur   HAMMER TOE SURGERY Left 2013   5th toe  INCISION AND DRAINAGE FOOT Left 1992   "ball of foot; stepped on piece of hard wire & it went thru my shoe"   IR ANGIOGRAM PULMONARY BILATERAL SELECTIVE  12/24/2021   IR ANGIOGRAM SELECTIVE EACH ADDITIONAL VESSEL  12/24/2021   IR ANGIOGRAM SELECTIVE EACH ADDITIONAL VESSEL  12/24/2021   IR INFUSION THROMBOL ARTERIAL INITIAL (MS)  12/24/2021   IR INFUSION THROMBOL ARTERIAL INITIAL (MS)  12/24/2021   IR THROMB F/U EVAL ART/VEN  FINAL DAY (MS)  12/25/2021   IR US GUIDE VASC ACCESS RIGHT  12/24/2021   KNEE ARTHROSCOPY Bilateral left x2 1997;  right 2004 & 2005   NASAL FRACTURE SURGERY  1997   PATELLA-FEMORAL ARTHROPLASTY Right 07-06-2003   dr Renae Fickle [facility]   PLANTAR FASCIA RELEASE Bilateral 1987 & 1993   RHINOPLASTY  2000   SHOULDER ARTHROSCOPY Left 2012;  2013   SHOULDER ARTHROSCOPY WITH DISTAL CLAVICLE RESECTION Right 07-31-2005   dr Eulah Pont  [facility]   w/ Debridement labral and adhesions, acromioplasty, bursectomy, and CA ligament release   TRANSTHORACIC ECHOCARDIOGRAM  02/12/2016   ef 65-70%, grade 1 diastolic dysfunction/  trivial TR   UPPER GASTROINTESTINAL ENDOSCOPY      Prior to Admission medications   Medication Sig Start Date End Date Taking? Authorizing Provider  rivaroxaban (XARELTO) 20 MG TABS tablet Take 1 tablet (20 mg total) by mouth daily with supper. START this only after you have completed the starter pack. 01/26/22  Yes Ghimire, Werner Lean, MD  baclofen (LIORESAL) 10 MG tablet Take 0.5-1 tablets (5-10 mg total) by mouth 3 (three) times daily. 07/24/22   Ardith Dark, MD  desonide (DESOWEN) 0.05 % lotion APPLY TOPICALLY TO THE AFFECTED AREA TWICE DAILY 05/01/23   Ardith Dark, MD  fluticasone (FLONASE) 50 MCG/ACT nasal spray PLACE 2 SPRAYS INTO BOTH NOSTRILS DAILY. USE TWICE A DAY FOR 1 WEEK, THEN REDUCE TO USING DAILY 03/31/23   Ardith Dark, MD  hyoscyamine (LEVSIN SL) 0.125 MG SL tablet DISSOLVE 2 TABLETS UNDER THE TONGUE EVERY 6 HOURS AS NEEDED 06/17/22   Ardith Dark, MD  LORazepam (ATIVAN) 1 MG tablet Take 0.5-1 tablets (0.5-1 mg total) by mouth every 8 (eight) hours as needed for anxiety. 05/26/17   Ardith Dark, MD  omeprazole (PRILOSEC) 40 MG capsule Take 1 capsule (40 mg total) by mouth daily. 03/06/23   Ardith Dark, MD  tiZANidine (ZANAFLEX) 4 MG tablet  06/13/21   [provider]  traMADol (ULTRAM) 50 MG tablet Take 1 tablet (50 mg total) by mouth 3 (three) times daily as  needed for moderate pain (pain score 4-6) or severe pain (pain score 7-10). 03/06/23   Ardith Dark, MD  triamcinolone cream (KENALOG) 0.1 % APPLY 1 APLLICATION TWICE DAILY TO PSORIASIS 05/06/23   Ardith Dark, MD    Current Outpatient Medications  Medication Sig Dispense Refill   rivaroxaban (XARELTO) 20 MG TABS tablet Take 1 tablet (20 mg total) by mouth daily with supper. START this only after you have completed the starter pack. 90 tablet 1   baclofen (LIORESAL) 10 MG tablet Take 0.5-1 tablets (5-10 mg total) by mouth 3 (three) times daily. 30 each 0   desonide (DESOWEN) 0.05 % lotion APPLY TOPICALLY TO THE AFFECTED AREA TWICE DAILY 59 mL 0   fluticasone (FLONASE) 50 MCG/ACT nasal spray PLACE 2 SPRAYS INTO BOTH NOSTRILS DAILY. USE TWICE A DAY FOR 1 WEEK, THEN REDUCE TO USING DAILY 16 g 6   hyoscyamine (LEVSIN  SL) 0.125 MG SL tablet DISSOLVE 2 TABLETS UNDER THE TONGUE EVERY 6 HOURS AS NEEDED 720 tablet 0   LORazepam (ATIVAN) 1 MG tablet Take 0.5-1 tablets (0.5-1 mg total) by mouth every 8 (eight) hours as needed for anxiety. 90 tablet 0   omeprazole (PRILOSEC) 40 MG capsule Take 1 capsule (40 mg total) by mouth daily.     tiZANidine (ZANAFLEX) 4 MG tablet      traMADol (ULTRAM) 50 MG tablet Take 1 tablet (50 mg total) by mouth 3 (three) times daily as needed for moderate pain (pain score 4-6) or severe pain (pain score 7-10). 90 tablet 5   triamcinolone cream (KENALOG) 0.1 % APPLY 1 APLLICATION TWICE DAILY TO PSORIASIS 454 g 1   Current Facility-Administered Medications  Medication Dose Route Frequency Provider Last Rate Last Admin   0.9 %  sodium chloride infusion  500 mL Intravenous Once Zakee Deerman, Carie Caddy, MD        Allergies as of 06/29/2023 - Review Complete 06/29/2023  Allergen Reaction Noted   Effexor xr [venlafaxine hcl er] Other (See Comments) 05/26/2017   Acyclovir and related Other (See Comments) 03/16/2017   Dexilant [dexlansoprazole] Other (See Comments) 11/29/2012   Ivp  dye [iodinated contrast media] Rash 12/30/2010   Soma [carisoprodol] Other (See Comments) 11/22/2010   Sulfa antibiotics Rash 03/10/2017   Zocor [simvastatin] Other (See Comments) 03/10/2017    Family History  Problem Relation Age of Onset   Heart disease Mother    Stroke Mother    Cirrhosis Father    Heart disease Father    Skin cancer Father    Heart disease Brother    Prostate cancer Maternal Grandfather    Parkinson's disease Paternal Grandfather    Colon cancer Neg Hx    Stomach cancer Neg Hx    Esophageal cancer Neg Hx    Rectal cancer Neg Hx     Social History   Socioeconomic History   Marital status: Divorced    Spouse name: Not on file   Number of children: 0   Years of education: Not on file   Highest education level: Not on file  Occupational History   Occupation: retired    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[employer]    Comment: disabled now   Occupation: retired  Tobacco Use   Smoking status: Never   Smokeless tobacco: Never  Vaping Use   Vaping status: Never Used  Substance and Sexual Activity   Alcohol use: No    Alcohol/week: 0.0 standard drinks of alcohol   Drug use: No   Sexual activity: Never  Other Topics Concern   Not on file  Social History Narrative   Goes by "[name]"   Divorced   No children   Retired from the post office   Has internet girlfriend on facebook from [place]   Social Drivers of Corporate investment banker Strain: Low Risk  (03/18/2023)   Overall Financial Resource Strain (CARDIA)    Difficulty of Paying Living Expenses: Not hard at all  Food Insecurity: No Food Insecurity (03/18/2023)   Hunger Vital Sign    Worried About Running Out of Food in the Last Year: Never true    Ran Out of Food in the Last Year: Never true  Transportation Needs: No Transportation Needs (03/18/2023)   PRAPARE - Administrator, Civil Service (Medical): No    Lack of Transportation (Non-Medical): No  Physical Activity: Sufficiently Active (03/18/2023)    Exercise Vital Sign  Days of Exercise per Week: 7 days    Minutes of Exercise per Session: 60 min  Stress: No Stress Concern Present (03/18/2023)   [employer] of Occupational Health - Occupational Stress Questionnaire    Feeling of Stress : Only a little  Social Connections: Socially Isolated (03/18/2023)   Social Connection and Isolation Panel [NHANES]    Frequency of Communication with Friends and Family: More than three times a week    Frequency of Social Gatherings with Friends and Family: Never    Attends Religious Services: Never    Database administrator or Organizations: No    Attends Banker Meetings: Never    Marital Status: Divorced  Catering manager Violence: Not At Risk (03/18/2023)   Humiliation, Afraid, Rape, and Kick questionnaire    Fear of Current or Ex-Partner: No    Emotionally Abused: No    Physically Abused: No    Sexually Abused: No    Physical Exam: Vital signs in last 24 hours: @BP  128/78   Pulse 68   Temp 98 F (36.7 C) (Temporal)   Ht 5\' 10"  (1.778 m)   Wt 143 lb (64.9 kg)   SpO2 98%   BMI 20.52 kg/m  GEN: NAD EYE: Sclerae anicteric ENT: MMM CV: Non-tachycardic Pulm: CTA b/l GI: Soft, NT/ND NEURO:  Alert & Oriented x 3   Erick Blinks, MD [facility]  06/29/2023 9:37 AM

## 2023-06-29 NOTE — Progress Notes (Signed)
 Called to room to assist during endoscopic procedure.  Patient ID and intended procedure confirmed with present staff. Received instructions for my participation in the procedure from the performing physician.

## 2023-06-29 NOTE — Op Note (Signed)
 [facility] Patient Name: David Armstrong Procedure Date: 06/29/2023 9:28 AM MRN: [id number] Endoscopist: Beverley Fiedler , MD, [id number] Age: 73 Referring MD:  Date of Birth: 1950/09/14 Gender: Male Account #: [id number] Procedure:                Upper GI endoscopy Indications:              Dysphagia, Follow-up of esophageal stenosis,                            Follow-up of acute gastric ulcer in gastric cardia                            (bx benign) as seen in Nov 2024 Medicines:                Monitored Anesthesia Care Procedure:                Pre-Anesthesia Assessment:                           - Prior to the procedure, a History and Physical                            was performed, and patient medications and                            allergies were reviewed. The patient's tolerance of                            previous anesthesia was also reviewed. The risks                            and benefits of the procedure and the sedation                            options and risks were discussed with the patient.                            All questions were answered, and informed consent                            was obtained. Prior Anticoagulants: The patient has                            taken Xarelto (rivaroxaban), last dose was 2 days                            prior to procedure. ASA Grade Assessment: II - A                            patient with mild systemic disease. After reviewing                            the risks and benefits, the patient was deemed in  satisfactory condition to undergo the procedure.                           After obtaining informed consent, the endoscope was                            passed under direct vision. Throughout the                            procedure, the patient's blood pressure, pulse, and                            oxygen saturations were monitored continuously. The                            GIF  [id number] #[id number] was introduced through the                            mouth, and advanced to the second part of duodenum.                            The upper GI endoscopy was accomplished without                            difficulty. The patient tolerated the procedure                            well. Scope In: Scope Out: Findings:                 The middle third of the esophagus and lower third                            of the esophagus were significantly tortuous.                           One benign-appearing, intrinsic moderate                            (circumferential scarring or stenosis; an endoscope                            may pass) stenosis was found at the                            gastroesophageal junction. This stenosis measured                            1.3 cm (inner diameter) x less than one cm (in                            length). The stenosis was traversed. A TTS dilator                            was passed through the  scope. Dilation with a                            15-16.5-18 mm balloon dilator was performed to 18                            mm. The dilation site was examined and showed mild                            mucosal disruption.                           A 5 cm hiatal hernia was present.                           There is no endoscopic evidence of ulceration in                            the cardia.                           The entire examined stomach was normal.                           The examined duodenum was normal. Complications:            No immediate complications. Estimated Blood Loss:     Estimated blood loss was minimal. Impression:               - Tortuous esophagus.                           - Benign-appearing esophageal stenosis. Dilated to                            18 mm with balloon.                           - 5 cm hiatal hernia.                           - Gastric cardia ulcer has healed.                           - Normal  stomach.                           - Normal examined duodenum.                           - No specimens collected. Recommendation:           - Patient has a contact number available for                            emergencies. The signs and symptoms of potential  delayed complications were discussed with the                            patient. Return to normal activities tomorrow.                            Written discharge instructions were provided to the                            patient.                           - Resume previous diet.                           - Continue present medications including omeprazole                            40 mg once daily.                           - Resume Xarelto (rivaroxaban) at prior dose                            tomorrow. Refer to managing physician for further                            adjustment of therapy. Beverley Fiedler, MD 06/29/2023 10:05:34 AM This report has been signed electronically.

## 2023-06-29 NOTE — Progress Notes (Signed)
 Report to PACU, RN, vss, BBS= Clear.

## 2023-06-29 NOTE — Patient Instructions (Signed)
 Thank you for letting us take care of your healthcare needs today. Please see handouts given to you on Hiatal Hernia Resume Previous diet. Resume Xarelto tomorrow. Continue previous medications including Omeprazole 40 mg daily.     YOU HAD AN ENDOSCOPIC PROCEDURE TODAY AT THE [facility]:   Refer to the procedure report that was given to you for any specific questions about what was found during the examination.  If the procedure report does not answer your questions, please call your gastroenterologist to clarify.  If you requested that your care partner not be given the details of your procedure findings, then the procedure report has been included in a sealed envelope for you to review at your convenience later.  YOU SHOULD EXPECT: Some feelings of bloating in the abdomen. Passage of more gas than usual.  Walking can help get rid of the air that was put into your GI tract during the procedure and reduce the bloating. If you had a lower endoscopy (such as a colonoscopy or flexible sigmoidoscopy) you may notice spotting of blood in your stool or on the toilet paper. If you underwent a bowel prep for your procedure, you may not have a normal bowel movement for a few days.  Please Note:  You might notice some irritation and congestion in your nose or some drainage.  This is from the oxygen used during your procedure.  There is no need for concern and it should clear up in a day or so.  SYMPTOMS TO REPORT IMMEDIATELY:  Following upper endoscopy (EGD)  Vomiting of blood or coffee ground material  New chest pain or pain under the shoulder blades  Painful or persistently difficult swallowing  New shortness of breath  Fever of 100F or higher  Black, tarry-looking stools  For urgent or emergent issues, a gastroenterologist can be reached at any hour by calling [phone]. Do not use MyChart messaging for urgent concerns.    DIET:  We do recommend a small meal at first, but  then you may proceed to your regular diet.  Drink plenty of fluids but you should avoid alcoholic beverages for 24 hours.  ACTIVITY:  You should plan to take it easy for the rest of today and you should NOT DRIVE or use heavy machinery until tomorrow (because of the sedation medicines used during the test).    FOLLOW UP: Our staff will call the number listed on your records the next business day following your procedure.  We will call around 7:15- 8:00 am to check on you and address any questions or concerns that you may have regarding the information given to you following your procedure. If we do not reach you, we will leave a message.     If any biopsies were taken you will be contacted by phone or by letter within the next 1-3 weeks.  Please call us at [phone] if you have not heard about the biopsies in 3 weeks.    SIGNATURES/CONFIDENTIALITY: You and/or your care partner have signed paperwork which will be entered into your electronic medical record.  These signatures attest to the fact that that the information above on your After Visit Summary has been reviewed and is understood.  Full responsibility of the confidentiality of this discharge information lies with you and/or your care-partner.

## 2023-06-29 NOTE — Progress Notes (Signed)
 Updated medical record.

## 2023-06-30 ENCOUNTER — Telehealth: Payer: Self-pay

## 2023-06-30 NOTE — Telephone Encounter (Signed)
 No answer after follow up call. Voice message left.

## 2023-07-01 ENCOUNTER — Ambulatory Visit: Payer: Medicare Other

## 2023-07-30 DIAGNOSIS — G5603 Carpal tunnel syndrome, bilateral upper limbs: Secondary | ICD-10-CM | POA: Diagnosis not present

## 2023-07-30 DIAGNOSIS — M5417 Radiculopathy, lumbosacral region: Secondary | ICD-10-CM | POA: Diagnosis not present

## 2023-07-30 DIAGNOSIS — G603 Idiopathic progressive neuropathy: Secondary | ICD-10-CM | POA: Diagnosis not present

## 2023-07-30 DIAGNOSIS — M5412 Radiculopathy, cervical region: Secondary | ICD-10-CM | POA: Diagnosis not present

## 2023-07-31 ENCOUNTER — Other Ambulatory Visit: Payer: Self-pay | Admitting: Specialist

## 2023-07-31 DIAGNOSIS — M5417 Radiculopathy, lumbosacral region: Secondary | ICD-10-CM

## 2023-07-31 DIAGNOSIS — M546 Pain in thoracic spine: Secondary | ICD-10-CM

## 2023-08-05 ENCOUNTER — Ambulatory Visit: Payer: Medicare Other | Admitting: *Deleted

## 2023-08-05 DIAGNOSIS — E538 Deficiency of other specified B group vitamins: Secondary | ICD-10-CM

## 2023-08-05 MED ORDER — CYANOCOBALAMIN 1000 MCG/ML IJ SOLN
1000.0000 ug | Freq: Once | INTRAMUSCULAR | Status: AC
Start: 2023-08-05 — End: 2023-08-05
  Administered 2023-08-05: 1000 ug via INTRAMUSCULAR

## 2023-08-05 NOTE — Progress Notes (Signed)
Patient presents for B12 injection today. Patient received her B12 injection in left Deltoid. Patient tolerated injection well.  Documentation entered in MAR in [employer].   

## 2023-08-06 ENCOUNTER — Other Ambulatory Visit: Payer: Self-pay | Admitting: Internal Medicine

## 2023-08-07 ENCOUNTER — Encounter: Payer: Self-pay | Admitting: Specialist

## 2023-08-09 ENCOUNTER — Ambulatory Visit
Admission: RE | Admit: 2023-08-09 | Discharge: 2023-08-09 | Disposition: A | Discharge status: Home / Self Care | Source: Ambulatory Visit | Attending: Specialist | Admitting: Specialist

## 2023-08-09 DIAGNOSIS — M546 Pain in thoracic spine: Secondary | ICD-10-CM

## 2023-08-09 DIAGNOSIS — M4854XA Collapsed vertebra, not elsewhere classified, thoracic region, initial encounter for fracture: Secondary | ICD-10-CM | POA: Diagnosis not present

## 2023-08-09 DIAGNOSIS — M5417 Radiculopathy, lumbosacral region: Secondary | ICD-10-CM

## 2023-08-09 DIAGNOSIS — R600 Localized edema: Secondary | ICD-10-CM | POA: Diagnosis not present

## 2023-08-09 DIAGNOSIS — M47816 Spondylosis without myelopathy or radiculopathy, lumbar region: Secondary | ICD-10-CM | POA: Diagnosis not present

## 2023-08-18 ENCOUNTER — Ambulatory Visit (INDEPENDENT_AMBULATORY_CARE_PROVIDER_SITE_OTHER): Admitting: Family Medicine

## 2023-08-18 ENCOUNTER — Encounter: Payer: Self-pay | Admitting: Family Medicine

## 2023-08-18 VITALS — BP 100/68 | HR 69 | Temp 97.2°F | Ht 70.0 in | Wt 141.0 lb

## 2023-08-18 DIAGNOSIS — H6123 Impacted cerumen, bilateral: Secondary | ICD-10-CM

## 2023-08-18 DIAGNOSIS — M549 Dorsalgia, unspecified: Secondary | ICD-10-CM

## 2023-08-18 DIAGNOSIS — H6122 Impacted cerumen, left ear: Secondary | ICD-10-CM | POA: Diagnosis not present

## 2023-08-18 DIAGNOSIS — G8929 Other chronic pain: Secondary | ICD-10-CM | POA: Diagnosis not present

## 2023-08-18 DIAGNOSIS — H6120 Impacted cerumen, unspecified ear: Secondary | ICD-10-CM

## 2023-08-18 DIAGNOSIS — E538 Deficiency of other specified B group vitamins: Secondary | ICD-10-CM

## 2023-08-18 DIAGNOSIS — H6121 Impacted cerumen, right ear: Secondary | ICD-10-CM

## 2023-08-18 MED ORDER — METHYLPREDNISOLONE ACETATE 80 MG/ML IJ SUSP
80.0000 mg | Freq: Once | INTRAMUSCULAR | Status: AC
  Administered 2023-08-18: 80 mg via INTRAMUSCULAR

## 2023-08-18 NOTE — Assessment & Plan Note (Signed)
 Follows with neurosurgery.  Recent MRI showed chronic stable compression fractures with other degenerative changes in lumbar and thoracic spine.  We need to avoid NSAIDs due to history of GI bleed.  He has tramadol  to use as needed.  Does not need refill on this today.  Will give 80 mg Depo-Medrol  injection today.  He has started this well in the past.  He is considering transferring to [facility] spine for second opinion.  He will let us  know if he needs referral.

## 2023-08-18 NOTE — Assessment & Plan Note (Signed)
 He will be finishing his round of B12 replacement soon.  We can recheck B12 level with next blood draw.

## 2023-08-18 NOTE — Progress Notes (Signed)
   David Armstrong is a 73 y.o. male who presents today for an office visit.  Assessment/Plan:  New/Acute Problems: Cerumen impaction Bilateral EAC with cerumen impaction.  Manual debridement and irrigation was attempted with modest amount of removal of cerumen however he was not able to tolerate fully due to discomfort.  Will refer to ENT for further evaluation and management.  We did recommend Debrox as well.  We discussed reasons to return to care and seek emergent care.  Chronic Problems Addressed Today: Chronic back pain Follows with neurosurgery.  Recent MRI showed chronic stable compression fractures with other degenerative changes in lumbar and thoracic spine.  We need to avoid NSAIDs due to history of GI bleed.  He has tramadol  to use as needed.  Does not need refill on this today.  Will give 80 mg Depo-Medrol  injection today.  He has started this well in the past.  He is considering transferring to [facility] spine for second opinion.  He will let us  know if he needs referral.  B12 deficiency He will be finishing his round of B12 replacement soon.  We can recheck B12 level with next blood draw.     Subjective:  HPI:  See Assessment / plan for status of chronic conditions.  His primary concern today is earwax impaction.  This has been going on for a couple of weeks. He has had this happen in the past. He has tried using ear drops without much improvement. Feels like both ears are stopped up.   He is also having ongoing issues with back pain.  HE has been following with neurosurgery for this and had MRI a few weeks ago that showed chronic compression fractures in lumbar spine. They have tried ESI without much improvement.  Tolerates well.  Does not need refill today.  Not having significant side effects.  He is interested in Depo-Medrol  injection today as this has worked well for him in the past for back pain.         Objective:  Physical Exam: BP 100/68   Pulse 69   Temp (!)  97.2 F (36.2 C) (Temporal)   Ht 5\' 10"  (1.778 m)   Wt 141 lb (64 kg)   SpO2 95%   BMI 20.23 kg/m   Gen: No acute distress, resting comfortably HEENT: Bilateral EAC with cerumen impaction. CV: Regular rate and rhythm with no murmurs appreciated Pulm: Normal work of breathing, clear to auscultation bilaterally with no crackles, wheezes, or rhonchi Neuro: Grossly normal, moves all extremities Psych: Normal affect and thought content  Procedure Note Verbal consent obtained. His bilateral EAC were visualized and cerumen impaction was noted bilaterally. We attempted to debride EAC with instrumentation however he was not able to tolerate.  Irrigation was attempted as well however he did not tolerate this today.      Jinny Mounts. Daneil Dunker, MD 08/18/2023 11:36 AM

## 2023-08-18 NOTE — Patient Instructions (Addendum)
 It was very nice to see you today!  I will refer you to see ENT for your earwax.  Please try using over-the-counter Debrox to soften the earwax to help with removal at home.  Will give you a steroid injection today.  Please let us  know if you need a referral to see a different back doctor.  Return if symptoms worsen or fail to improve.   Take care, Dr Daneil Dunker  PLEASE NOTE:  If you had any lab tests, please let us  know if you have not heard back within a few days. You may see your results on mychart before we have a chance to review them but we will give you a call once they are reviewed by us .   If we ordered any referrals today, please let us  know if you have not heard from their office within the next week.   If you had any urgent prescriptions sent in today, please check with the pharmacy within an hour of our visit to make sure the prescription was transmitted appropriately.   Please try these tips to maintain a healthy lifestyle:  Eat at least 3 REAL meals and 1-2 snacks per day.  Aim for no more than 5 hours between eating.  If you eat breakfast, please do so within one hour of getting up.   Each meal should contain half fruits/vegetables, one quarter protein, and one quarter carbs (no bigger than a computer mouse)  Cut down on sweet beverages. This includes juice, soda, and sweet tea.   Drink at least 1 glass of water with each meal and aim for at least 8 glasses per day  Exercise at least 150 minutes every week.

## 2023-08-20 DIAGNOSIS — H6123 Impacted cerumen, bilateral: Secondary | ICD-10-CM | POA: Diagnosis not present

## 2023-09-02 ENCOUNTER — Ambulatory Visit (INDEPENDENT_AMBULATORY_CARE_PROVIDER_SITE_OTHER): Admitting: *Deleted

## 2023-09-02 DIAGNOSIS — D2371 Other benign neoplasm of skin of right lower limb, including hip: Secondary | ICD-10-CM | POA: Diagnosis not present

## 2023-09-02 DIAGNOSIS — M792 Neuralgia and neuritis, unspecified: Secondary | ICD-10-CM | POA: Diagnosis not present

## 2023-09-02 DIAGNOSIS — Q828 Other specified congenital malformations of skin: Secondary | ICD-10-CM | POA: Diagnosis not present

## 2023-09-02 DIAGNOSIS — E538 Deficiency of other specified B group vitamins: Secondary | ICD-10-CM | POA: Diagnosis not present

## 2023-09-02 DIAGNOSIS — M21962 Unspecified acquired deformity of left lower leg: Secondary | ICD-10-CM | POA: Diagnosis not present

## 2023-09-02 DIAGNOSIS — D2372 Other benign neoplasm of skin of left lower limb, including hip: Secondary | ICD-10-CM | POA: Diagnosis not present

## 2023-09-02 MED ORDER — CYANOCOBALAMIN 1000 MCG/ML IJ SOLN
1000.0000 ug | Freq: Once | INTRAMUSCULAR | Status: AC
  Administered 2023-09-02: 1000 ug via INTRAMUSCULAR

## 2023-09-02 NOTE — Progress Notes (Signed)
 Patient presents for B12 injection today. Patient received her B12 injection in LEFT Deltoid. Patient tolerated injection well.  Documentation entered in [facility] in EpicCare.

## 2023-09-04 ENCOUNTER — Other Ambulatory Visit: Payer: Self-pay | Admitting: Family Medicine

## 2023-09-04 NOTE — Telephone Encounter (Unsigned)
 Copied from CRM [id number]. Topic: Clinical - Medication Refill >> Sep 04, 2023  2:33 PM Fonda T wrote: Medication: triamcinolone  cream (KENALOG ) 0.1 %  Has the patient contacted their pharmacy? Yes Alex, at Pharmacy calling on behalf of patient, inquiring if medication can be approved today, prior to weekend. (Agent: If no, request that the patient contact the pharmacy for the refill. If patient does not wish to contact the pharmacy document the reason why and proceed with request.) (Agent: If yes, when and what did the pharmacy advise?)  This is the patient's preferred pharmacy:  [id number] - [place], [place] - [address] [facility] [address] [facility] [address] [place] [address] Phone: [phone] Fax: [phone]   Is this the correct pharmacy for this prescription? Yes If no, delete pharmacy and type the correct one.   Has the prescription been filled recently? Yes  Is the patient out of the medication? Yes  Has the patient been seen for an appointment in the last year OR does the patient have an upcoming appointment? Yes  Can we respond through MyChart? Yes  Agent: Please be advised that Rx refills may take up to 3 business days. We ask that you follow-up with your pharmacy.

## 2023-09-08 MED ORDER — TRIAMCINOLONE ACETONIDE 0.1 % EX CREA: TOPICAL_CREAM | Freq: Two times a day (BID) | CUTANEOUS | 1 refills | Status: AC

## 2023-09-29 ENCOUNTER — Ambulatory Visit: Payer: Self-pay

## 2023-09-29 NOTE — Telephone Encounter (Signed)
 FYI Only or Action Required?: Action required by provider  Patient was last seen in primary care on 08/18/2023 by Rodney Clamp, MD. Called Nurse Triage reporting Leg Pain and Otalgia. Symptoms began several days ago. Interventions attempted: Rest, hydration, or home remedies. Symptoms are: unchanged.  Triage Disposition: Go to ED or PCP/Alternative with Approval, See Physician Within 24 Hours  Patient/caregiver understands and will follow disposition?: No, refuses disposition  Copied from CRM [id number]. Topic: Clinical - Red Word Triage >> Sep 29, 2023  2:43 PM Elle L wrote: Red Word that prompted transfer to Nurse Triage: The patient is concerned about a blood clot as he is having pain in his leg. He is on a blood thinner. He is feeling unwell and weak and his left ear feels blocked. Reason for Disposition  Earache  (Exceptions: brief ear pain of < 60 minutes duration, earache occurring during air travel  Patient sounds very sick or weak to the triager  Answer Assessment - Initial Assessment Questions 1. ONSET: When did the pain start?      Started a few days ago 2. LOCATION: Where is the pain located?      Left lower leg 3. PAIN: How bad is the pain?    (Scale 1-10; or mild, moderate, severe)   -  MILD (1-3): doesn't interfere with normal activities    -  MODERATE (4-7): interferes with normal activities (e.g., work or school) or awakens from sleep, limping    -  SEVERE (8-10): excruciating pain, unable to do any normal activities, unable to walk     5 out of 10 4. WORK OR EXERCISE: Has there been any recent work or exercise that involved this part of the body?      no 5. CAUSE: What do you think is causing the leg pain?     Concerned for DVT-leg has some discoloration 6. OTHER SYMPTOMS: Do you have any other symptoms? 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 Patient reports left lower leg pain that he is concerned for another blood clot. Patient recommended to ED for evaluation due to history. Patient refused.  Answer Assessment - Initial Assessment Questions 1. LOCATION: Which ear is involved?     Left ear 2. ONSET: When did the ear start hurting      today 3. SEVERITY: How bad is the pain?  (Scale 1-10; mild, moderate or severe)   - MILD (1-3): doesn't interfere with normal activities    - MODERATE (4-7): interferes with normal activities or awakens from sleep    - SEVERE (8-10): excruciating pain, unable to do any normal activities      Mild-moderate 4. URI SYMPTOMS: Do you have a runny nose or cough?     no 5. FEVER: Do you have a fever? If Yes, ask: What is your temperature, how was it measured, and when did it start?     no 6. CAUSE: Have you been swimming recently?, How often do you use Q-TIPS?, Have you had any recent air travel or scuba diving?     no 7. OTHER SYMPTOMS: Do you have any other symptoms? (e.g., headache, stiff neck, dizziness, vomiting, runny nose, decreased hearing)     Patient reports not feeling well  Protocols used: Leg Pain-A-AH, Earache-A-AH

## 2023-09-30 NOTE — Telephone Encounter (Signed)
 Spoke with patient, stated doing a bit better  Schedule an office visit with PCP on 10/01/2023

## 2023-10-01 ENCOUNTER — Encounter: Payer: Self-pay | Admitting: Family Medicine

## 2023-10-01 ENCOUNTER — Ambulatory Visit (INDEPENDENT_AMBULATORY_CARE_PROVIDER_SITE_OTHER): Admitting: Family Medicine

## 2023-10-01 VITALS — BP 108/71 | HR 78 | Temp 98.6°F | Ht 70.0 in | Wt 144.2 lb

## 2023-10-01 DIAGNOSIS — M7912 Myalgia of auxiliary muscles, head and neck: Secondary | ICD-10-CM | POA: Diagnosis not present

## 2023-10-01 DIAGNOSIS — G5603 Carpal tunnel syndrome, bilateral upper limbs: Secondary | ICD-10-CM | POA: Diagnosis not present

## 2023-10-01 DIAGNOSIS — R739 Hyperglycemia, unspecified: Secondary | ICD-10-CM | POA: Diagnosis not present

## 2023-10-01 DIAGNOSIS — K589 Irritable bowel syndrome without diarrhea: Secondary | ICD-10-CM

## 2023-10-01 DIAGNOSIS — G2581 Restless legs syndrome: Secondary | ICD-10-CM | POA: Diagnosis not present

## 2023-10-01 DIAGNOSIS — M5417 Radiculopathy, lumbosacral region: Secondary | ICD-10-CM | POA: Diagnosis not present

## 2023-10-01 DIAGNOSIS — G8929 Other chronic pain: Secondary | ICD-10-CM

## 2023-10-01 DIAGNOSIS — M549 Dorsalgia, unspecified: Secondary | ICD-10-CM

## 2023-10-01 DIAGNOSIS — L219 Seborrheic dermatitis, unspecified: Secondary | ICD-10-CM

## 2023-10-01 DIAGNOSIS — G603 Idiopathic progressive neuropathy: Secondary | ICD-10-CM | POA: Diagnosis not present

## 2023-10-01 DIAGNOSIS — R350 Frequency of micturition: Secondary | ICD-10-CM

## 2023-10-01 LAB — URINALYSIS, ROUTINE W REFLEX MICROSCOPIC
Bilirubin Urine: NEGATIVE
Ketones, ur: NEGATIVE
Leukocytes,Ua: NEGATIVE
Nitrite: NEGATIVE
Specific Gravity, Urine: 1.015 (ref 1.000–1.030)
Total Protein, Urine: NEGATIVE
Urine Glucose: NEGATIVE
Urobilinogen, UA: 0.2 (ref 0.0–1.0)
pH: 6 (ref 5.0–8.0)

## 2023-10-01 MED ORDER — TRAMADOL HCL 50 MG PO TABS: 50.0000 mg | ORAL_TABLET | Freq: Three times a day (TID) | ORAL | 5 refills | Status: DC | PRN

## 2023-10-01 MED ORDER — HYOSCYAMINE SULFATE 0.125 MG SL SUBL: 0.2500 mg | SUBLINGUAL_TABLET | Freq: Four times a day (QID) | SUBLINGUAL | 5 refills | Status: AC | PRN

## 2023-10-01 MED ORDER — DESONIDE 0.05 % EX LOTN: TOPICAL_LOTION | Freq: Two times a day (BID) | CUTANEOUS | 5 refills | Status: DC

## 2023-10-01 NOTE — Assessment & Plan Note (Signed)
 Follows with neurosurgery.  He had an injection performed earlier today and has had some improvement in symptoms already.  He did have recent episode of leg pain which is improving with injection as well.  He has tramadol  to use as needed.  Will refill this today.  Will defer further management to neurosurgery.

## 2023-10-01 NOTE — Patient Instructions (Addendum)
 It was very nice to see you today!  We will check blood work and a urine sample today.  I will refill your medications today.   We may need to start a medication called Flomax  to help with the urinary frequency depending on results of your labs today.  Return if symptoms worsen or fail to improve.   Take care, Dr Daneil Dunker  PLEASE NOTE:  If you had any lab tests, please let us  know if you have not heard back within a few days. You may see your results on mychart before we have a chance to review them but we will give you a call once they are reviewed by us .   If we ordered any referrals today, please let us  know if you have not heard from their office within the next week.   If you had any urgent prescriptions sent in today, please check with the pharmacy within an hour of our visit to make sure the prescription was transmitted appropriately.   Please try these tips to maintain a healthy lifestyle:  Eat at least 3 REAL meals and 1-2 snacks per day.  Aim for no more than 5 hours between eating.  If you eat breakfast, please do so within one hour of getting up.   Each meal should contain half fruits/vegetables, one quarter protein, and one quarter carbs (no bigger than a computer mouse)  Cut down on sweet beverages. This includes juice, soda, and sweet tea.   Drink at least 1 glass of water with each meal and aim for at least 8 glasses per day  Exercise at least 150 minutes every week.

## 2023-10-01 NOTE — Assessment & Plan Note (Signed)
 Stable on topical desonide  as needed.  Will refill today.

## 2023-10-01 NOTE — Progress Notes (Addendum)
   David Armstrong is a 73 y.o. male who presents today for an office visit.  Assessment/Plan:  New/Acute Problems: Urinary Frequency  No red flags.  Likely has underlying BPH.  Check UA, urine culture, and PSA.  Depending on results may consider empiric trial of Flomax  versus having him follow-up with urology.  Otalgia No significant abnormalities on exam. May be related to recent cerumen impaction. He can follow up with ENT if this continues to be an issue.   Chronic Problems Addressed Today: Chronic back pain Follows with neurosurgery.  He had an injection performed earlier today and has had some improvement in symptoms already.  He did have recent episode of leg pain which is improving with injection as well.  He has tramadol  to use as needed.  Will refill this today.  Will defer further management to neurosurgery.  IBS (irritable bowel syndrome) Overall symptoms are stable on hyoscyamine  250mcg every 6 hours as needed.  Will refill today.  Seborrheic dermatitis Stable on topical desonide  as needed.  Will refill today.     Subjective:  HPI:  See Assessment / plan for status of chronic conditions. Patient here with a few issues he would like to discuss today. He has been having some left ear fullness.  We saw him 6 weeks ago for this but not were not successful for manually debriding the EAC. He saw ENT and they were able to remove the wax. He has been having intermittent pain to the area.   He has been seeing neurosurgery for his back pain as well. He did have a flare up of leg pain a few days ago but this has since resolved. He had injection this morning in his back and has had some improvement with this.  He has also noticed increased frequency the last several months. No fevers or chills. No dysuria. No urinary hesitancy.        Objective:  Physical Exam: BP 108/71   Pulse 78   Temp 98.6 F (37 C) (Temporal)   Ht 5' 10 (1.778 m)   Wt 144 lb 3.2 oz (65.4 kg)   SpO2  98%   BMI 20.69 kg/m   Gen: No acute distress, resting comfortably HEENT: Left tympanic membrane and EAC clear.  CV: Regular rate and rhythm with no murmurs appreciated Pulm: Normal work of breathing, clear to auscultation bilaterally with no crackles, wheezes, or rhonchi Neuro: Grossly normal, moves all extremities Psych: Normal affect and thought content      Iysis David M. Daneil Dunker, MD 10/01/2023 1:38 PM

## 2023-10-01 NOTE — Assessment & Plan Note (Signed)
 Overall symptoms are stable on hyoscyamine  250mcg every 6 hours as needed.  Will refill today.

## 2023-10-02 LAB — COMPREHENSIVE METABOLIC PANEL WITH GFR
ALT: 13 U/L (ref 0–53)
AST: 16 U/L (ref 0–37)
Albumin: 4.2 g/dL (ref 3.5–5.2)
Alkaline Phosphatase: 78 U/L (ref 39–117)
BUN: 14 mg/dL (ref 6–23)
CO2: 28 meq/L (ref 19–32)
Calcium: 9 mg/dL (ref 8.4–10.5)
Chloride: 104 meq/L (ref 96–112)
Creatinine, Ser: 0.82 mg/dL (ref 0.40–1.50)
GFR: 87.74 mL/min (ref >60.00)
Glucose, Bld: 100 mg/dL — ABNORMAL HIGH (ref 70–99)
Potassium: 4.7 meq/L (ref 3.5–5.1)
Sodium: 140 meq/L (ref 135–145)
Total Bilirubin: 0.5 mg/dL (ref 0.2–1.2)
Total Protein: 6.6 g/dL (ref 6.0–8.3)

## 2023-10-02 LAB — CBC
HCT: 42.7 % (ref 39.0–52.0)
Hemoglobin: 14.3 g/dL (ref 13.0–17.0)
MCHC: 33.5 g/dL (ref 30.0–36.0)
MCV: 91.3 fl (ref 78.0–100.0)
Platelets: 188 10*3/uL (ref 150.0–400.0)
RBC: 4.68 Mil/uL (ref 4.22–5.81)
RDW: 14.5 % (ref 11.5–15.5)
WBC: 5.1 10*3/uL (ref 4.0–10.5)

## 2023-10-02 LAB — PSA: PSA: 1.28 ng/mL (ref 0.10–4.00)

## 2023-10-02 LAB — URINE CULTURE
MICRO NUMBER:: 16601232
Result:: NO GROWTH
SPECIMEN QUALITY:: ADEQUATE

## 2023-10-02 LAB — HEMOGLOBIN A1C: Hgb A1c MFr Bld: 5.4 % (ref 4.6–6.5)

## 2023-10-05 ENCOUNTER — Ambulatory Visit: Payer: Self-pay | Admitting: Family Medicine

## 2023-10-05 NOTE — Progress Notes (Signed)
 He has a small amount of blood in his urine.  This is probably due to kidney stones.  He does not have a urinary tract infection.  His blood work is all normal.  Recommend he discuss his urinary frequency with urology.  We can also try a medication called Flomax  to see if this helps if he wishes to do this before he sees urology.

## 2023-11-04 ENCOUNTER — Encounter: Payer: Self-pay | Admitting: Family Medicine

## 2023-11-04 ENCOUNTER — Ambulatory Visit (INDEPENDENT_AMBULATORY_CARE_PROVIDER_SITE_OTHER): Admitting: Family Medicine

## 2023-11-04 VITALS — BP 115/74 | HR 78 | Temp 97.3°F | Ht 70.0 in | Wt 143.8 lb

## 2023-11-04 DIAGNOSIS — I824Z1 Acute embolism and thrombosis of unspecified deep veins of right distal lower extremity: Secondary | ICD-10-CM

## 2023-11-04 DIAGNOSIS — G8929 Other chronic pain: Secondary | ICD-10-CM | POA: Diagnosis not present

## 2023-11-04 DIAGNOSIS — L821 Other seborrheic keratosis: Secondary | ICD-10-CM

## 2023-11-04 DIAGNOSIS — M549 Dorsalgia, unspecified: Secondary | ICD-10-CM

## 2023-11-04 MED ORDER — METHYLPREDNISOLONE ACETATE 40 MG/ML IJ SUSP
40.0000 mg | Freq: Once | INTRAMUSCULAR | Status: AC
  Administered 2023-11-04: 80 mg via INTRAMUSCULAR

## 2023-11-04 MED ORDER — KETOROLAC TROMETHAMINE 60 MG/2ML IM SOLN
60.0000 mg | Freq: Once | INTRAMUSCULAR | Status: AC
  Administered 2023-11-04: 60 mg via INTRAMUSCULAR

## 2023-11-04 MED ORDER — DESONIDE 0.05 % EX LOTN: TOPICAL_LOTION | Freq: Two times a day (BID) | CUTANEOUS | 5 refills | Status: AC

## 2023-11-04 NOTE — Addendum Note (Signed)
 Addended by: IDA ELORA HERO on: 11/04/2023 11:53 AM   Modules accepted: Orders

## 2023-11-04 NOTE — Assessment & Plan Note (Signed)
 Follows with neurosurgery.  He has issues with complete lumbar compression fractures as well as several scattered compression fractures in his thoracic spine.  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  Believe this is probably what is causing most of his current right leg pain.  Does not have any red flag signs or symptoms.  Advised him to follow-up with neurosurgery soon to discuss ongoing treatment options that we will give 80 mg of Depo-Medrol  and 60 mg of Toradol  today.  He has tolerated these very well in the past.  We did discuss referral to pain clinic to discuss ongoing pain management however he would like to hold off on this for now.  He will follow-up with neurosurgery as previously planned though will let us  know if he changes his mind about referral.

## 2023-11-04 NOTE — Progress Notes (Signed)
   David Armstrong is a 74 y.o. male who presents today for an office visit.  Assessment/Plan:  New/Acute Problems: Right leg pain No red flags.  Overall reassuring exam.  Likely due to lumbar radiculopathy from his known lumbar spine compression fractures and foraminal stenosis.  Overall very low suspicion for DVT given his exam and the fact that he has been consistently on his Xarelto  without any missed doses -reassured patient that he likely does not have recurrent DVT.  Will be treating his back pain as below.  He will let us  know if pain does not improve and would consider ultrasound at that time.  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  Reassured him as above that his right leg pain is likely not due to recurrent DVT.  Seborrheic keratoses He has a few inflamed seborrheic keratosis on his back.  He will follow-up with dermatology though it is okay for him to use topical desonide  to the area.  Chronic back pain Follows with neurosurgery.  He has issues with complete lumbar compression fractures as well as several scattered compression fractures in his thoracic spine.  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  Believe this is probably what is causing most of his current right leg pain.  Does not have any red flag signs or symptoms.  Advised him to follow-up with neurosurgery soon to discuss ongoing treatment options that we will give 80 mg of Depo-Medrol  and 60 mg of Toradol  today.  He has tolerated these very well in the past.  We did discuss referral to pain clinic to discuss ongoing pain management however he would like to hold off on this for now.  He will follow-up with neurosurgery as previously planned though will let us  know if he changes his mind about referral.     Subjective:  HPI:  See assessment / plan for status of chronic conditions.  Patient here today with back pain. This has been a persistent issues for many years and we last saw him about a month ago for this.  He follows with neurosurgery for this as well. He has noticed more pain in his right leg recently and more itching on his back.  No reported bowel or bladder incontinence.  No reported urinary retention.  He has been taking tramadol  as needed though still having some issues with persistent pain.  He did receive a Depo-Medrol  injection a few months ago and did very well with this and would like to have this repeated if possible.       Objective:  Physical Exam: BP 115/74   Pulse 78   Temp (!) 97.3 F (36.3 C) (Temporal)   Ht 5' 10 (1.778 m)   Wt 143 lb 12.8 oz (65.2 kg)   SpO2 98%   BMI 20.63 kg/m   Gen: No acute distress, resting comfortably MUSCULOSKELETAL:  - Back: No deformities.  Nontender to palpation. - Legs: No deformities.  Full range of motion throughout.  Neurovascular intact distally Skin: Several scattered inflamed seborrheic keratoses on back. Neuro: Grossly normal, moves all extremities Psych: Normal affect and thought content      David Armstrong M. Kennyth, MD 11/04/2023 11:41 AM

## 2023-11-04 NOTE — Assessment & Plan Note (Signed)
 He has a few inflamed seborrheic keratosis on his back.  He will follow-up with dermatology though it is okay for him to use topical desonide  to the area.

## 2023-11-04 NOTE — Patient Instructions (Signed)
 It was very nice to see you today!  I will refill your desonide  today.  Please let us  know if your pain does not improve with the steroid injection.  Please keep your appointment with neurosurgery for September.  Return if symptoms worsen or fail to improve.   Take care, Dr Kennyth  PLEASE NOTE:  If you had any lab tests, please let us  know if you have not heard back within a few days. You may see your results on mychart before we have a chance to review them but we will give you a call once they are reviewed by us .   If we ordered any referrals today, please let us  know if you have not heard from their office within the next week.   If you had any urgent prescriptions sent in today, please check with the pharmacy within an hour of our visit to make sure the prescription was transmitted appropriately.   Please try these tips to maintain a healthy lifestyle:  Eat at least 3 REAL meals and 1-2 snacks per day.  Aim for no more than 5 hours between eating.  If you eat breakfast, please do so within one hour of getting up.   Each meal should contain half fruits/vegetables, one quarter protein, and one quarter carbs (no bigger than a computer mouse)  Cut down on sweet beverages. This includes juice, soda, and sweet tea.   Drink at least 1 glass of water with each meal and aim for at least 8 glasses per day  Exercise at least 150 minutes every week.

## 2023-11-04 NOTE — Assessment & Plan Note (Signed)
 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  Reassured him as above that his right leg pain is likely not due to recurrent DVT.

## 2023-11-27 ENCOUNTER — Encounter: Payer: Self-pay | Admitting: Podiatry

## 2023-11-27 ENCOUNTER — Ambulatory Visit (INDEPENDENT_AMBULATORY_CARE_PROVIDER_SITE_OTHER): Admitting: Podiatry

## 2023-11-27 DIAGNOSIS — M778 Other enthesopathies, not elsewhere classified: Secondary | ICD-10-CM | POA: Diagnosis not present

## 2023-11-27 DIAGNOSIS — B070 Plantar wart: Secondary | ICD-10-CM

## 2023-11-27 NOTE — Progress Notes (Signed)
 Patient presents with painful lesion on the plantar aspect of the left foot that he thinks might be a wart.  He has noticed it in the past month or 2 and is beginning larger and more painful.  He says even before this he had some pain in the forefoot both left and right.  Bother him with walking.  He had a puncture wound in the left foot medial to where the lesion that got infected many years ago years ago.   Physical exam:  General appearance: Pleasant, and in no acute distress. AOx3.  Vascular: Pedal pulses: DP 2/4 bilaterally, PT 2/4 bilaterally.  Mild to moderate edema lower legs bilaterally. Capillary fill time immediate bilaterally.  Neurological: Light touch intact feet bilaterally.  Normal Achilles reflex bilaterally.  No clonus or spasticity noted.  Negative Mulder sign feet bilaterally  Dermatologic:   Tender lesion with lateral pressure plantar forefoot left with skin lines going around the lesion and a pinpoint hemorrhage present.  Skin normal temperature bilaterally.  Skin thin and atrophic with no hair growth on the lower extremity.  Areas of hyperpigmentation.  Atrophic fat pad plantar forefoot bilaterally  Musculoskeletal: Tenderness under the lesser MTPs on the right with some soreness on the left also.  No tenderness of the range of motion of the lesser MTPs.  Strength lower extremity.  Better range of motion ankle joint and subtalar joint.    Diagnosis: 1.  Capsulitis feet bilaterally. 2.  Plantar verruca left foot.  Plan: -New patient office visit for evaluation and management level 3.  Modifier 25. - Discussed with him of the verrucous lesion and etiology and treatment.  Discussed the other possibility that it could be.  Discussed the pain in the forefoot by has some metatarsalgia and capsulitis with atrophic fat pad.  We can investigate this more at next visit. -Applied Salinocaine compound to lesion(s) as noted in physical exam after debriding lesions to pinpoint  bleeding.  Salinocaine applied to lesion(s) and covered with an occlusive dressing with Coban wrap.  Written and oral instructions given to patient.   Return 2 wks f/u wart LT

## 2023-11-30 DIAGNOSIS — D225 Melanocytic nevi of trunk: Secondary | ICD-10-CM | POA: Diagnosis not present

## 2023-11-30 DIAGNOSIS — D045 Carcinoma in situ of skin of trunk: Secondary | ICD-10-CM | POA: Diagnosis not present

## 2023-11-30 DIAGNOSIS — L814 Other melanin hyperpigmentation: Secondary | ICD-10-CM | POA: Diagnosis not present

## 2023-11-30 DIAGNOSIS — D492 Neoplasm of unspecified behavior of bone, soft tissue, and skin: Secondary | ICD-10-CM | POA: Diagnosis not present

## 2023-11-30 DIAGNOSIS — L570 Actinic keratosis: Secondary | ICD-10-CM | POA: Diagnosis not present

## 2023-11-30 DIAGNOSIS — L821 Other seborrheic keratosis: Secondary | ICD-10-CM | POA: Diagnosis not present

## 2023-11-30 DIAGNOSIS — L538 Other specified erythematous conditions: Secondary | ICD-10-CM | POA: Diagnosis not present

## 2023-12-11 ENCOUNTER — Encounter: Payer: Self-pay | Admitting: Podiatry

## 2023-12-11 ENCOUNTER — Ambulatory Visit: Admitting: Podiatry

## 2023-12-11 DIAGNOSIS — Z48817 Encounter for surgical aftercare following surgery on the skin and subcutaneous tissue: Secondary | ICD-10-CM | POA: Diagnosis not present

## 2023-12-11 DIAGNOSIS — B070 Plantar wart: Secondary | ICD-10-CM

## 2023-12-11 NOTE — Progress Notes (Signed)
 Patient presents follow-up wart plantar left forefoot.  Doing better still sore but is little bit better than before treatment.   Physical exam:  General appearance: Pleasant, and in no acute distress. AOx3.  Vascular: Pedal pulses: DP 2/4 bilaterally, PT 2/4 bilaterally.  Mild to moderate edema lower legs bilaterally. Capillary fill time immediate bilaterally.  Neurological: Grossly intact bilaterally  Dermatologic:   Verrucous lesion plantar forefoot left.  Reduced from before.  Still some pinpoint hemorrhages upon debridement.  Skin normal temperature bilaterally.  Skin normal color, tone, and texture bilaterally.   Musculoskeletal:      Diagnosis: 1.  Plantar verruca left foot  Plan: -Applied Salinocaine compound to lesion(s) as noted in physical exam after debriding lesions to pinpoint bleeding.  Salinocaine applied to lesion(s) and covered with an occlusive dressing with Coban wrap.  Written and oral instructions given to patient.  -Return in 2 weeks for follow-up and reevaluation.

## 2023-12-25 ENCOUNTER — Ambulatory Visit (INDEPENDENT_AMBULATORY_CARE_PROVIDER_SITE_OTHER): Admitting: Podiatry

## 2023-12-25 DIAGNOSIS — B070 Plantar wart: Secondary | ICD-10-CM | POA: Diagnosis not present

## 2023-12-25 NOTE — Progress Notes (Signed)
  Patient presents follow-up wart plantar left forefoot.  Almost no discomfort at this point   Physical exam:   General appearance: Pleasant, and in no acute distress. AOx3.   Vascular: Pedal pulses: DP 2/4 bilaterally, PT 2/4 bilaterally.  Mild to moderate edema lower legs bilaterally. Capillary fill time immediate bilaterally.   Neurological: Grossly intact bilaterally   Dermatologic:   Verrucous lesion plantar forefoot left.  Reduced from before.  Verrucous lesions almost completely resolved and is very thin at this point.   Musculoskeletal:         Diagnosis: 1.  Plantar verruca left foot   Plan: -Applied Salinocaine compound to lesion(s) as noted in physical exam after debriding lesions to pinpoint bleeding.  Salinocaine applied to lesion(s) and covered with an occlusive dressing with Coban wrap.  Written and oral instructions given to patient.   - As needed.  Watch for any signs of recurrence if it does come back or becomes painful again to call us .

## 2023-12-31 DIAGNOSIS — G603 Idiopathic progressive neuropathy: Secondary | ICD-10-CM | POA: Diagnosis not present

## 2023-12-31 DIAGNOSIS — M7912 Myalgia of auxiliary muscles, head and neck: Secondary | ICD-10-CM | POA: Diagnosis not present

## 2023-12-31 DIAGNOSIS — G2581 Restless legs syndrome: Secondary | ICD-10-CM | POA: Diagnosis not present

## 2023-12-31 DIAGNOSIS — M5417 Radiculopathy, lumbosacral region: Secondary | ICD-10-CM | POA: Diagnosis not present

## 2023-12-31 DIAGNOSIS — G5603 Carpal tunnel syndrome, bilateral upper limbs: Secondary | ICD-10-CM | POA: Diagnosis not present

## 2024-01-15 ENCOUNTER — Other Ambulatory Visit: Payer: Self-pay | Admitting: Internal Medicine

## 2024-01-26 ENCOUNTER — Ambulatory Visit (INDEPENDENT_AMBULATORY_CARE_PROVIDER_SITE_OTHER): Admitting: Podiatry

## 2024-01-26 DIAGNOSIS — B070 Plantar wart: Secondary | ICD-10-CM

## 2024-01-26 NOTE — Progress Notes (Signed)
 Patient presents follow-up verrucous lesion lateral foot.  He said it seems like it is still there.  He said the other lesion seems to be doing fine does not feel anything there.   Physical exam:  General appearance: Pleasant, and in no acute distress. AOx3.  Vascular: Pedal pulses: DP 2/4 bilaterally, PT 2/4 bilaterally.  Mild edema lower legs bilaterally. Capillary fill time immediate.  Neurological: Intact bilaterally  Dermatologic:   Verrucous lesion plantar lateral foot left still present.  Only about 1 or 2 mm in diameter.  Lesion plantar forefoot has resolved.  Skin normal temperature bilaterally.  Skin normal color, tone, and texture bilaterally.   Musculoskeletal:      Diagnosis: 1.  Plantar wart left  Plan: -Wart on the plantar lateral aspect of the foot left is still present the lesion of the plantar forefoot is resolved. -Applied Salinocaine compound to lesion(s) as noted in physical exam after debriding lesions to pinpoint bleeding.  Salinocaine applied to lesion(s) and covered with an occlusive dressing with Coban wrap.  Written and oral instructions given to patient.  Return 2 weeks follow-up consider biopsy lesion plantar lateral foot if still present

## 2024-01-27 DIAGNOSIS — D045 Carcinoma in situ of skin of trunk: Secondary | ICD-10-CM | POA: Diagnosis not present

## 2024-01-27 DIAGNOSIS — L570 Actinic keratosis: Secondary | ICD-10-CM | POA: Diagnosis not present

## 2024-01-27 DIAGNOSIS — L738 Other specified follicular disorders: Secondary | ICD-10-CM | POA: Diagnosis not present

## 2024-02-09 ENCOUNTER — Ambulatory Visit (INDEPENDENT_AMBULATORY_CARE_PROVIDER_SITE_OTHER): Admitting: Podiatry

## 2024-02-09 ENCOUNTER — Encounter: Payer: Self-pay | Admitting: Podiatry

## 2024-02-09 DIAGNOSIS — L600 Ingrowing nail: Secondary | ICD-10-CM

## 2024-02-09 NOTE — Progress Notes (Signed)
 Patient presents for follow-up wart plantar lateral aspect foot left.  Doing much better no discomfort.  There is a new complaint of pain along the medial aspect of the nail border on the hallux left.  She has been a little red and irritated at the edge of the nail.  Has not noticed any drainage or crusting.   Physical exam:  General appearance: Pleasant, and in no acute distress. AOx3.  Vascular: Pedal pulses: DP 2/4 bilaterally, PT 2/4 bilaterally.  Mild edema lower legs bilaterally. Capillary fill time immediate bilaterally.  Neurological: Grossly intact bilateral   dermatologic:   Incurvated slightly ingrown nail border hallux medial border left.  Some redness along the nail edge with irritation of the skin and some localized edema.  Nail incurvated sober.  Verruca plantar lateral foot left appears to have resolved skin normal temperature bilaterally.  Skin normal color, tone, and texture bilaterally.   Musculoskeletal: Tenderness distal medial hallux left.    Diagnosis: 1.  Ingrown nail hallux left medial border  Plan: -Established office visit for evaluation and management level 3. - Discussed with him the ingrown nail border.  Today we just debrided the nail border to provide relief.  Just keep an eye on this if it does become a problem and start getting ingrown again may need to do matricectomy of the nail border.  Recommend he soak the foot once or twice a day for 15 minutes warm Epsom salt water.  If still painful after a week call for appointment -Plantar verruca left foot appears to have resolved.  If it does become a problem or recur he can call for an appointment  Return as needed

## 2024-03-03 DIAGNOSIS — M5417 Radiculopathy, lumbosacral region: Secondary | ICD-10-CM | POA: Diagnosis not present

## 2024-03-03 DIAGNOSIS — G5603 Carpal tunnel syndrome, bilateral upper limbs: Secondary | ICD-10-CM | POA: Diagnosis not present

## 2024-03-03 DIAGNOSIS — G603 Idiopathic progressive neuropathy: Secondary | ICD-10-CM | POA: Diagnosis not present

## 2024-03-03 DIAGNOSIS — M5412 Radiculopathy, cervical region: Secondary | ICD-10-CM | POA: Diagnosis not present

## 2024-03-04 ENCOUNTER — Ambulatory Visit: Payer: Self-pay | Admitting: *Deleted

## 2024-03-04 NOTE — Telephone Encounter (Signed)
 FYI Only or Action Required?: FYI only for provider: appointment scheduled on 11/24.  Patient was last seen in primary care on 11/04/2023 by Kennyth Worth HERO, MD.  Called Nurse Triage reporting Back Pain.  Symptoms began several weeks ago.  Interventions attempted: Prescription medications: tramadol  .  Symptoms are: gradually worsening.  Triage Disposition: See PCP Within 2 Weeks  Patient/caregiver understands and will follow disposition?: Yes  Copied from CRM [id number]. Topic: Clinical - Red Word Triage >> Mar 04, 2024 10:10 AM Viola FALCON wrote: [facility] that prompted transfer to Nurse Triage: Patient having pain in back, hands and arms - requesting appt for injection Reason for Disposition  Back pain present > 2 weeks  Answer Assessment - Initial Assessment Questions Call to CAL- patient will need appointment with provider first  1. ONSET: When did the pain begin? (e.g., minutes, hours, days)     Patient is requesting injection- 6 months ago- helps, 1-2 weeks has had increased pain 2. LOCATION: Where does it hurt? (upper, mid or lower back)     Spine pain- all over, both arms and hands 3. SEVERITY: How bad is the pain?  (e.g., Scale 1-10; mild, moderate, or severe)     8/10 4. PATTERN: Is the pain constant? (e.g., yes, no; constant, intermittent)      constant 5. RADIATION: Does the pain shoot into your legs or somewhere else?     no 6. CAUSE:  What do you think is causing the back pain?      Compression fractures of spine 7. BACK OVERUSE:  Any recent lifting of heavy objects, strenuous work or exercise?     Housework, washing car 8. MEDICINES: What have you taken so far for the pain? (e.g., nothing, acetaminophen , NSAIDS)     Tramadol  daily 9. NEUROLOGIC SYMPTOMS: Do you have any weakness, numbness, or problems with bowel/bladder control?     no 10. OTHER SYMPTOMS: Do you have any other symptoms? (e.g., fever, abdomen pain, burning with urination, blood  in urine)       no  Protocols used: Back Pain-A-AH

## 2024-03-04 NOTE — Telephone Encounter (Signed)
 Noted- Appointment 03/07/2024

## 2024-03-07 ENCOUNTER — Ambulatory Visit (INDEPENDENT_AMBULATORY_CARE_PROVIDER_SITE_OTHER): Admitting: Family Medicine

## 2024-03-07 ENCOUNTER — Encounter: Payer: Self-pay | Admitting: Family Medicine

## 2024-03-07 VITALS — BP 116/60 | HR 84 | Temp 99.0°F | Resp 16 | Ht 70.0 in | Wt 148.0 lb

## 2024-03-07 DIAGNOSIS — J309 Allergic rhinitis, unspecified: Secondary | ICD-10-CM

## 2024-03-07 DIAGNOSIS — S129XXA Fracture of neck, unspecified, initial encounter: Secondary | ICD-10-CM | POA: Insufficient documentation

## 2024-03-07 DIAGNOSIS — K5900 Constipation, unspecified: Secondary | ICD-10-CM | POA: Insufficient documentation

## 2024-03-07 DIAGNOSIS — G8929 Other chronic pain: Secondary | ICD-10-CM | POA: Diagnosis not present

## 2024-03-07 DIAGNOSIS — M549 Dorsalgia, unspecified: Secondary | ICD-10-CM

## 2024-03-07 DIAGNOSIS — M810 Age-related osteoporosis without current pathological fracture: Secondary | ICD-10-CM

## 2024-03-07 MED ORDER — METHYLPREDNISOLONE ACETATE 80 MG/ML IJ SUSP
80.0000 mg | Freq: Once | INTRAMUSCULAR | Status: AC
  Administered 2024-03-07: 80 mg via INTRAMUSCULAR

## 2024-03-07 MED ORDER — KETOROLAC TROMETHAMINE 60 MG/2ML IM SOLN
60.0000 mg | Freq: Once | INTRAMUSCULAR | Status: AC
  Administered 2024-03-07: 60 mg via INTRAMUSCULAR

## 2024-03-07 MED ORDER — FLUTICASONE PROPIONATE 50 MCG/ACT NA SUSP: 2.0000 | Freq: Every day | NASAL | 3 refills | Status: AC

## 2024-03-07 MED ORDER — LINACLOTIDE 72 MCG PO CAPS: 72.0000 ug | ORAL_CAPSULE | Freq: Every day | ORAL | 5 refills | Status: AC

## 2024-03-07 NOTE — Progress Notes (Signed)
 David Armstrong is a 73 y.o. male who presents today for an office visit.  Assessment/Plan:  Chronic Problems Addressed Today: Chronic back pain Patient follows with neurosurgery.  All of his lumbar vertebra have compression fracture as well as a few scattered compression fractures in his thoracic spine.  He has had a flareup of pain over the last several months.  He request repeat injection of Depo-Medrol  and Toradol  today which has worked well for him in the past.  He will follow-up with neurosurgery soon though may be considering lumbar fusion at some point in the future.  We will also refer him to osteoporosis clinic as below.  Defer further management to orthopedics and neurosurgery.  We have previously discussed referral to see pain management however he declined.  He will let us  know if he changes his mind.  He also did ask about Journavx though discussed with patient that this was currently indicated only for acute issues and that insurance would likely not pay for this.  [facility].  Constipation He has chronic constipation that is multifactorial secondary to medication and inadequate fluid intake.  We did discuss conservative measures including increasing hydration and fiber intake.  He was on Linzess  in the past however felt like the dose is too strong.  Will try 72 mcg daily and he will follow-up with us  in a few weeks via MyChart.  Allergic rhinitis Stable on Flonase  as needed.  Will refill today.  Osteoporosis Was previously on Boniva per orthopedics however has been off of this for the last year and a half or so.  He does not remember why this was discontinued.  Due to his numerous compression fractures would be reasonable for him to continue with management for his osteoporosis at this time-will refer him over to the osteoporosis clinic for further management.     Subjective:  HPI:  See assessment / plan for status  of chronic conditions.   Discussed the use of AI scribe software for clinical note transcription with the patient, who gave verbal consent to proceed.  History of Present Illness David Armstrong is a 73 year old male with a history of back pain and elevated PSA who presents with back pain and constipation.  He has been experiencing back pain for several months, located in the back with occasional 'popping' sensations around the T12 vertebra. Certain movements, such as adjusting his bed, can trigger the pain. He has a history of compression fractures in the thoracic and lumbar spine, specifically T6, T11, T12, and all five lumbar vertebrae. He has previously received injections for pain management and is seeking similar treatment again.  He experiences constipation, which has been an issue for six months to a year. He attributes this to his medication regimen, which includes Ativan  and tramadol , known to cause constipation. He has tried various treatments, including Linzess  at 145 mg, which he found too strong, and Miralax, which was not effective. He is considering trying a lower dose of Linzess  at 72 mg.  He has a history of blood clots in the lungs and heart, which were treated with a catheter procedure. He recalls being in a critical condition at the time but managed to recover.  He has been on Boniva for bone density but is unsure why he stopped. He has experienced multiple vertebral fractures and is concerned about further fractures. He has had dental issues, including tooth extractions  and implants, which he attributes to his bone health.  He reports gaining some weight back after previously dropping to 128 pounds.  ROS: Per HPI, otherwise a complete review of systems was negative.   PMH:  The following were reviewed and entered/updated in epic: Past Medical History:  Diagnosis Date   Anxiety    Arthritis    lower back; hips;' right knee    Bipolar disorder (HCC)    Chronic  constipation    Depression    Diverticulosis of colon    Edema of both lower extremities    ANKLES   Fatty liver    GERD (gastroesophageal reflux disease)    Hiatal hernia    History of anal fissures    History of deep vein thrombosis (DVT) of lower extremity 02/11/2016   right lower extremity distal and proximal extensive acute   History of Helicobacter pylori infection 2012; 2009   History of kidney stones    History of pneumonia 01/2015   CAP   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    Hyperlipidemia    IBS (irritable bowel syndrome)    Left ureteral stone    Nocturia    Peripheral neuropathy    per pt due to back DDD   Personal history of colonic polyps    HYPERPLASTIC POLYP   Pulmonary nodule, right    incidental finding CT chest angio 02-11-2016 stable   TMJ (temporomandibular joint syndrome)    Varicosities of leg    Patient Active Problem List   Diagnosis Date Noted   Closed fracture of cervical vertebral body (HCC) 03/07/2024   Constipation 03/07/2024   Seborrheic keratoses 11/04/2023   Bilateral impacted cerumen 08/20/2023   Screening for colon cancer 12/24/2022   Dysphagia 12/24/2022   Generalized abdominal pain 12/24/2022   B12 deficiency 05/20/2022   Pulmonary emboli (HCC) 12/24/2021   Pressure injury of skin 12/24/2021   Osteoporosis 08/30/2021   Chronic back pain 07/29/2021   Venous stasis dermatitis 06/17/2021   Arthralgia of right knee 04/10/2021   Sleep-disordered breathing 11/30/2020   Skin lesion 11/30/2020   Headache 10/10/2020   Cataract of both eyes 07/18/2020   Osteoarthritis 06/07/2020   DVT (deep venous thrombosis) (HCC) 02/16/2020   Leg edema 01/13/2020   Internal hemorrhoids 02/24/2018   Dyslipidemia 02/05/2018   Seborrheic dermatitis 09/14/2017   Inguinal hernia 06/05/2017   Allergic rhinitis 04/23/2017   Chronic anticoagulation 06/04/2016   History of pulmonary embolism and DVT (Oct 2017) 02/11/2016    ETD (Eustachian tube dysfunction), bilateral 02/04/2016   Chronic pansinusitis 12/19/2015   Cough, persistent 12/19/2015   Kidney calculus 04/19/2014   Peripheral neuropathy (HCC) 12/15/2013   IBS (irritable bowel syndrome) 03/09/2012   GERD 10/26/2007   Anxiety state 11/23/2006   Depression, major, single episode, mild 11/23/2006   Past Surgical History:  Procedure Laterality Date   BELPHAROPTOSIS REPAIR Bilateral 01/2015   CATARACT EXTRACTION W/ INTRAOCULAR LENS  IMPLANT, BILATERAL  04/2016   COLONOSCOPY     COLONOSCOPY WITH ESOPHAGOGASTRODUODENOSCOPY (EGD)  last one 03-02-2013   CYSTOSCOPY/RETROGRADE/URETEROSCOPY/STONE EXTRACTION WITH BASKET  2010   CYSTOSCOPY/URETEROSCOPY/HOLMIUM LASER/STENT PLACEMENT Left 03/16/2017   Procedure: CYSTOSCOPY/RETROGRADE/URETEROSCOPY/HOLMIUM LASER/STENT PLACEMENT, STONE BASKET EXTRACTION AND STENT PLACEMENT;  Surgeon: Ottelin, Mark, MD;  Location: [facility] [place];  Service: Urology;  Laterality: Left;   EXCISIONAL HEMORRHOIDECTOMY  2015 approx.   EXTRACORPOREAL SHOCK WAVE LITHOTRIPSY  1987; 1992   EXTRACORPOREAL SHOCK WAVE LITHOTRIPSY Left 09/05/2022   Procedure: LEFT EXTRACORPOREAL  SHOCK WAVE LITHOTRIPSY (ESWL);  Surgeon: Selma Donnice SAUNDERS, MD;  Location: [facility];  Service: Urology;  Laterality: Left;   EXTRACORPOREAL SHOCK WAVE LITHOTRIPSY Right 09/22/2022   Procedure: RIGHT EXTRACORPOREAL SHOCK WAVE LITHOTRIPSY (ESWL);  Surgeon: Matilda Senior, MD;  Location: [facility];  Service: Urology;  Laterality: Right;   EXTRACORPOREAL SHOCK WAVE LITHOTRIPSY Left 11/24/2022   Procedure: LEFT EXTRACORPOREAL SHOCK WAVE LITHOTRIPSY (ESWL);  Surgeon: Devere Lonni Righter, MD;  Location: [facility];  Service: Urology;  Laterality: Left;   FOOT NEUROMA SURGERY Bilateral left 09/2015; right 04/ 2017   morton' neuroma   FOOT NEUROMA SURGERY Right 2004   morton's neuroma and removal heel spur    HAMMER TOE SURGERY Left 2013   5th toe   INCISION AND DRAINAGE FOOT Left 1992   ball of foot; stepped on piece of hard wire & it went thru my shoe   IR ANGIOGRAM PULMONARY BILATERAL SELECTIVE  12/24/2021   IR ANGIOGRAM SELECTIVE EACH ADDITIONAL VESSEL  12/24/2021   IR ANGIOGRAM SELECTIVE EACH ADDITIONAL VESSEL  12/24/2021   IR INFUSION THROMBOL ARTERIAL INITIAL (MS)  12/24/2021   IR INFUSION THROMBOL ARTERIAL INITIAL (MS)  12/24/2021   IR THROMB F/U EVAL ART/VEN FINAL DAY (MS)  12/25/2021   IR US  GUIDE VASC ACCESS RIGHT  12/24/2021   KNEE ARTHROSCOPY Bilateral left x2 1997;  right 2004 & 2005   NASAL FRACTURE SURGERY  1997   PATELLA-FEMORAL ARTHROPLASTY Right 07-06-2003   dr deward [facility]   PLANTAR FASCIA RELEASE Bilateral 1987 & 1993   RHINOPLASTY  2000   SHOULDER ARTHROSCOPY Left 2012;  2013   SHOULDER ARTHROSCOPY WITH DISTAL CLAVICLE RESECTION Right 07-31-2005   dr beverley  [facility]   w/ Debridement labral and adhesions, acromioplasty, bursectomy, and CA ligament release   TRANSTHORACIC ECHOCARDIOGRAM  02/12/2016   ef 65-70%, grade 1 diastolic dysfunction/  trivial TR   UPPER GASTROINTESTINAL ENDOSCOPY      Family History  Problem Relation Age of Onset   Heart disease Mother    Stroke Mother    Cirrhosis Father    Heart disease Father    Skin cancer Father    Heart disease Brother    Prostate cancer Maternal Grandfather    Parkinson's disease Paternal Grandfather    Colon cancer Neg Hx    Stomach cancer Neg Hx    Esophageal cancer Neg Hx    Rectal cancer Neg Hx     Medications- reviewed and updated Current Outpatient Medications  Medication Sig Dispense Refill   ABRYSVO 120 MCG/0.5ML injection Inject 0.5 mLs into the muscle once.     baclofen  (LIORESAL ) 10 MG tablet Take 0.5-1 tablets (5-10 mg total) by mouth 3 (three) times daily. 30 each 0   cephALEXin  (KEFLEX ) 500 MG capsule Take 500 mg by mouth 2 (two) times daily.     desonide  (DESOWEN ) 0.05 % lotion Apply topically 2  (two) times daily. 120 mL 5   HYDROcodone -acetaminophen  (NORCO) 10-325 MG tablet Take 1 tablet by mouth every 6 (six) hours as needed.     hyoscyamine  (LEVSIN  SL) 0.125 MG SL tablet Take 2 tablets (0.25 mg total) by mouth every 6 (six) hours as needed. 720 tablet 5   linaclotide  (LINZESS ) 72 MCG capsule Take 1 capsule (72 mcg total) by mouth daily before breakfast. 30 capsule 5   LORazepam  (ATIVAN ) 1 MG tablet Take 0.5-1 tablets (0.5-1 mg total) by mouth every 8 (eight) hours as needed for anxiety. 90  tablet 0   omeprazole  (PRILOSEC) 40 MG capsule TAKE 1 CAPSULE(40 MG) BY MOUTH DAILY 90 capsule 1   pregabalin (LYRICA) 75 MG capsule Take 75 mg by mouth daily.     rivaroxaban  (XARELTO ) 20 MG TABS tablet Take 1 tablet (20 mg total) by mouth daily with supper. START this only after you have completed the starter pack. 90 tablet 1   tiZANidine  (ZANAFLEX ) 4 MG tablet      traMADol  (ULTRAM ) 50 MG tablet Take 1 tablet (50 mg total) by mouth 3 (three) times daily as needed for moderate pain (pain score 4-6) or severe pain (pain score 7-10). 90 tablet 5   triamcinolone  cream (KENALOG ) 0.1 % Apply topically 2 (two) times daily. 454 g 1   fluticasone  (FLONASE ) 50 MCG/ACT nasal spray Place 2 sprays into both nostrils daily. USE twice a day for 1 week, then reduce to using daily. 48 g 3   No current facility-administered medications for this visit.    Allergies-reviewed and updated Allergies  Allergen Reactions   Effexor  Xr [Venlafaxine  Hcl Er] Other (See Comments)    Insomnia   Acyclovir  And Related Other (See Comments)    unknown   Dexilant  [Dexlansoprazole ] Other (See Comments)    Muscle aches   Ivp Dye [Iodinated Contrast Media] Rash   Soma [Carisoprodol] Other (See Comments)    Sores on arm   Sulfa  Antibiotics Rash   Zocor  [Simvastatin ] Other (See Comments)    Muscle ache/ pain    Social History   Socioeconomic History   Marital status: Divorced    Spouse name: Not on file   Number of  children: 0   Years of education: Not on file   Highest education level: Not on file  Occupational History   Occupation: retired    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[employer]    Comment: disabled now   Occupation: retired  Tobacco Use   Smoking status: Never   Smokeless tobacco: Never  Vaping Use   Vaping status: Never Used  Substance and Sexual Activity   Alcohol  use: No    Alcohol /week: 0.0 standard drinks of alcohol    Drug use: No   Sexual activity: Never  Other Topics Concern   Not on file  Social History Narrative   Goes by [place]   Divorced   No children   Retired from the post office   Has internet girlfriend on facebook from [place]   Social Drivers of Health   Financial Resource Strain: Low Risk  (03/18/2023)   Overall Financial Resource Strain (CARDIA)    Difficulty of Paying Living Expenses: Not hard at all  Food Insecurity: No Food Insecurity (03/18/2023)   Hunger Vital Sign    Worried About Running Out of Food in the Last Year: Never true    Ran Out of Food in the Last Year: Never true  Transportation Needs: No Transportation Needs (03/18/2023)   PRAPARE - Administrator, Civil Service (Medical): No    Lack of Transportation (Non-Medical): No  Physical Activity: Sufficiently Active (03/18/2023)   Exercise Vital Sign    Days of Exercise per Week: 7 days    Minutes of Exercise per Session: 60 min  Stress: No Stress Concern Present (03/18/2023)   [employer] of Occupational Health - Occupational Stress Questionnaire    Feeling of Stress : Only a little  Social Connections: Socially Isolated (03/18/2023)   Social Connection and Isolation Panel    Frequency of Communication with Friends and Family: More  than three times a week    Frequency of Social Gatherings with Friends and Family: Never    Attends Religious Services: Never    Database Administrator or Organizations: No    Attends Engineer, Structural: Never    Marital Status: Divorced            Objective:  Physical Exam: BP 116/60   Pulse 84   Temp 99 F (37.2 C) (Temporal)   Resp 16   Ht 5' 10 (1.778 m)   Wt 148 lb (67.1 kg)   SpO2 99%   BMI 21.24 kg/m   Gen: No acute distress, resting comfortably CV: Regular rate and rhythm with no murmurs appreciated Pulm: Normal work of breathing, clear to auscultation bilaterally with no crackles, wheezes, or rhonchi MUSCULOSKELETAL: No deformities. Low back tender to palpation Neuro: Grossly normal, moves all extremities Psych: Normal affect and thought content  Time Spent: 45 minutes of total time was spent on the date of the encounter performing the following actions: chart review prior to seeing the patient, obtaining history, performing a medically necessary exam, counseling on the treatment plan, placing orders, and documenting in our EHR.        Worth HERO. Kennyth, MD 03/07/2024 1:39 PM

## 2024-03-07 NOTE — Assessment & Plan Note (Signed)
 He has chronic constipation that is multifactorial secondary to medication and inadequate fluid intake.  We did discuss conservative measures including increasing hydration and fiber intake.  He was on Linzess  in the past however felt like the dose is too strong.  Will try 72 mcg daily and he will follow-up with us  in a few weeks via MyChart.

## 2024-03-07 NOTE — Assessment & Plan Note (Addendum)
 Patient follows with neurosurgery.  All of his lumbar vertebra have compression fracture as well as a few scattered compression fractures in his thoracic spine.  He has had a flareup of pain over the last several months.  He request repeat injection of Depo-Medrol  and Toradol  today which has worked well for him in the past.  He will follow-up with neurosurgery soon though may be considering lumbar fusion at some point in the future.  We will also refer him to osteoporosis clinic as below.  Defer further management to orthopedics and neurosurgery.  We have previously discussed referral to see pain management however he declined.  He will let us  know if he changes his mind.  He also did ask about Journavx though discussed with patient that this was currently indicated only for acute issues and that insurance would likely not pay for this.  [facility] for him.

## 2024-03-07 NOTE — Patient Instructions (Signed)
 It was very nice to see you today!  VISIT SUMMARY: You visited us  today for back pain and constipation. We discussed your chronic back pain due to vertebral compression fractures and your constipation likely caused by medication. We also addressed your osteoporosis, nasal congestion, and elevated PSA levels.  YOUR PLAN: COMPRESSION FRACTURES OF THORACIC AND LUMBAR VERTEBRAE WITH CHRONIC BACK PAIN: You have chronic back pain from compression fractures in your thoracic and lumbar spine, which is worsened by certain movements. -You received injections of 80 mg Depamidrol and 60 mg Toradol  for pain relief. -We discussed Jurnavax as a non-opioid pain management option and are [facility]. -You have a follow-up appointment with the neurosurgeon in December.  CONSTIPATION: Your chronic constipation is likely due to your use of tramadol  and your spinal fractures. -We prescribed Linzess  72 mg, with the option to double the dose if needed. -We discussed the importance of increasing your water intake. -Miralax and fiber supplements were recommended.  AGE-RELATED OSTEOPOROSIS: You have osteoporosis with multiple vertebral fractures. -We discussed switching from Boniva to Prolia to improve your bone density. -You were referred to the osteoporosis clinic for further management.  ALLERGIC RHINITIS: You experience nasal congestion and sleep disturbances due to allergies. -We prescribed Flonase  nasal spray to help with your symptoms.  Return if symptoms worsen or fail to improve.   Take care, Dr Kennyth  PLEASE NOTE:  If you had any lab tests, please let us  know if you have not heard back within a few days. You may see your results on mychart before we have a chance to review them but we will give you a call once they are reviewed by us .   If we ordered any referrals today, please let us  know if you have not heard from their office within the next week.   If you had  any urgent prescriptions sent in today, please check with the pharmacy within an hour of our visit to make sure the prescription was transmitted appropriately.   Please try these tips to maintain a healthy lifestyle:  Eat at least 3 REAL meals and 1-2 snacks per day.  Aim for no more than 5 hours between eating.  If you eat breakfast, please do so within one hour of getting up.   Each meal should contain half fruits/vegetables, one quarter protein, and one quarter carbs (no bigger than a computer mouse)  Cut down on sweet beverages. This includes juice, soda, and sweet tea.   Drink at least 1 glass of water with each meal and aim for at least 8 glasses per day  Exercise at least 150 minutes every week.

## 2024-03-07 NOTE — Assessment & Plan Note (Signed)
 Was previously on Boniva per orthopedics however has been off of this for the last year and a half or so.  He does not remember why this was discontinued.  Due to his numerous compression fractures would be reasonable for him to continue with management for his osteoporosis at this time-will refer him over to the osteoporosis clinic for further management.

## 2024-03-07 NOTE — Assessment & Plan Note (Signed)
 Stable on Flonase  as needed.  Will refill today.

## 2024-03-07 NOTE — Addendum Note (Signed)
 Addended by: IDA ELORA HERO on: 03/07/2024 01:46 PM   Modules accepted: Orders

## 2024-03-22 ENCOUNTER — Ambulatory Visit

## 2024-03-22 ENCOUNTER — Ambulatory Visit: Payer: Medicare Other | Admitting: Family Medicine

## 2024-03-24 ENCOUNTER — Ambulatory Visit: Admitting: Physician Assistant

## 2024-03-24 VITALS — Ht 68.25 in | Wt 145.0 lb

## 2024-03-24 DIAGNOSIS — M810 Age-related osteoporosis without current pathological fracture: Secondary | ICD-10-CM | POA: Diagnosis not present

## 2024-03-24 NOTE — Addendum Note (Signed)
 Addended by: EILLEEN ROSINA HERO on: 03/24/2024 10:04 AM   Modules accepted: Orders

## 2024-03-24 NOTE — Progress Notes (Signed)
 Office Visit Note   Patient: David Armstrong           Date of Birth: 1950/07/08           MRN: [id number] Visit Date: 03/24/2024              Requested by: Kennyth Worth HERO, MD [address] [place],  [place] [address] PCP: Kennyth Worth HERO, MD   Assessment & Plan: Visit Diagnoses:  1. Age-related osteoporosis without current pathological fracture     Plan: Patient is a 73 year old gentleman referred by Dr. Kennyth for evaluation of osteoporosis.  He has had more than 1 compression fracture in his spine as well as a radius fracture.  He is not taking any medication except for vitamin D  50,000 units a week when his vitamin D  was low.  He admits he is not taking this or any type of vitamin D  supplementation anymore.  He says he has had 8 compression fractures in his spine.  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  No history of gastric bypass she does have a history of severe reflux does not have epilepsy.  He does not take calcium  or vitamin D .  He has never been a smoker he does not drink.  He walks for exercise.  He is does have had some major dental work.  He states that his father and he believes some aunts had difficulty with poor bone quality.  Not sure fractures.  FRAX score is over 20% for 10-year probability of fracture and almost 10% chance of hip fracture in the next 10 years with most recent bone density 2 years ago demonstrating a -3 at the femoral neck.  That given his multiple compression fractures he is at high risk for repeat fracture.  I discussed with him that Tymlos or its biosimilar is what I would recommend for him this could be followed in 18 months with Prolia.  Will place authorization for this he would need to see me for blood work about 4 months after he started treatment.  I reviewed the side effects including tachycardia lightheadedness bone pain hypercalcemia.  Will place for  authorization.  In the meantime I have asked that he track his calcium  and we will draw vitamin D  today and new metabolic panel as well as an SPEP just to illuminate any other causes.  He has given it injections to himself before in the subcu tissue but certainly we can provide training or get him in touch with training if he needs it.  Patient is in agreement with the plan also should try to do some resistance training.  45 minutes was spent with him reviewing medication options reviewing his chart and labs as well as his most recent bone density scan  Follow-Up Instructions: Return if symptoms worsen or fail to improve.   Orders:  No orders of the defined types were placed in this encounter.  No orders of the defined types were placed in this encounter.     Procedures: No procedures performed   Clinical Data: No additional findings.   Subjective: No chief complaint on file.   HPI patient is a pleasant 73 year old gentleman referred by Dr. Kennyth for evaluation of osteoporosis  Review of Systems  All other systems reviewed and are negative.    Objective: Vital Signs: Ht 5' 8.25 (1.734 m)   Wt 145 lb (65.8 kg)  BMI 21.89 kg/m   Physical Exam Constitutional:      Appearance: Normal appearance.  Pulmonary:     Effort: Pulmonary effort is normal.  Skin:    General: Skin is warm and dry.  Neurological:     General: No focal deficit present.     Mental Status: He is alert and oriented to person, place, and time.  Psychiatric:        Mood and Affect: Mood normal.        Behavior: Behavior normal.       Specialty Comments:  No specialty comments available.  Imaging: No results found.   PMFS History: Patient Active Problem List   Diagnosis Date Noted   Closed fracture of cervical vertebral body (HCC) 03/07/2024   Constipation 03/07/2024   Seborrheic keratoses 11/04/2023   Bilateral impacted cerumen 08/20/2023   Screening for colon cancer 12/24/2022    Dysphagia 12/24/2022   Generalized abdominal pain 12/24/2022   B12 deficiency 05/20/2022   Pulmonary emboli (HCC) 12/24/2021   Pressure injury of skin 12/24/2021   Age-related osteoporosis without current pathological fracture 08/30/2021   Chronic back pain 07/29/2021   Venous stasis dermatitis 06/17/2021   Arthralgia of right knee 04/10/2021   Sleep-disordered breathing 11/30/2020   Skin lesion 11/30/2020   Headache 10/10/2020   Cataract of both eyes 07/18/2020   Osteoarthritis 06/07/2020   DVT (deep venous thrombosis) (HCC) 02/16/2020   Leg edema 01/13/2020   Internal hemorrhoids 02/24/2018   Dyslipidemia 02/05/2018   Seborrheic dermatitis 09/14/2017   Inguinal hernia 06/05/2017   Allergic rhinitis 04/23/2017   Chronic anticoagulation 06/04/2016   History of pulmonary embolism and DVT (Oct 2017) 02/11/2016   ETD (Eustachian tube dysfunction), bilateral 02/04/2016   Chronic pansinusitis 12/19/2015   Cough, persistent 12/19/2015   Kidney calculus 04/19/2014   Peripheral neuropathy (HCC) 12/15/2013   IBS (irritable bowel syndrome) 03/09/2012   GERD 10/26/2007   Anxiety state 11/23/2006   Depression, major, single episode, mild 11/23/2006   Past Medical History:  Diagnosis Date   Anxiety    Arthritis    lower back; hips;' right knee    Bipolar disorder (HCC)    Chronic constipation    Depression    Diverticulosis of colon    Edema of both lower extremities    ANKLES   Fatty liver    GERD (gastroesophageal reflux disease)    Hiatal hernia    History of anal fissures    History of deep vein thrombosis (DVT) of lower extremity 02/11/2016   right lower extremity distal and proximal extensive acute   History of Helicobacter pylori infection 2012; 2009   History of kidney stones    History of pneumonia 01/2015   CAP   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    Hyperlipidemia    IBS (irritable bowel syndrome)    Left ureteral stone     Nocturia    Peripheral neuropathy    per pt due to back DDD   Personal history of colonic polyps    HYPERPLASTIC POLYP   Pulmonary nodule, right    incidental finding CT chest angio 02-11-2016 stable   TMJ (temporomandibular joint syndrome)    Varicosities of leg     Family History  Problem Relation Age of Onset   Heart disease Mother    Stroke Mother    Cirrhosis Father    Heart disease Father    Skin cancer Father    Heart disease Brother  Prostate cancer Maternal Grandfather    Parkinson's disease Paternal Grandfather    Colon cancer Neg Hx    Stomach cancer Neg Hx    Esophageal cancer Neg Hx    Rectal cancer Neg Hx     Past Surgical History:  Procedure Laterality Date   BELPHAROPTOSIS REPAIR Bilateral 01/2015   CATARACT EXTRACTION W/ INTRAOCULAR LENS  IMPLANT, BILATERAL  04/2016   COLONOSCOPY     COLONOSCOPY WITH ESOPHAGOGASTRODUODENOSCOPY (EGD)  last one 03-02-2013   CYSTOSCOPY/RETROGRADE/URETEROSCOPY/STONE EXTRACTION WITH BASKET  2010   CYSTOSCOPY/URETEROSCOPY/HOLMIUM LASER/STENT PLACEMENT Left 03/16/2017   Procedure: CYSTOSCOPY/RETROGRADE/URETEROSCOPY/HOLMIUM LASER/STENT PLACEMENT, STONE BASKET EXTRACTION AND STENT PLACEMENT;  Surgeon: Ottelin, Mark, MD;  Location: [facility] [place];  Service: Urology;  Laterality: Left;   EXCISIONAL HEMORRHOIDECTOMY  2015 approx.   EXTRACORPOREAL SHOCK WAVE LITHOTRIPSY  1987; 1992   EXTRACORPOREAL SHOCK WAVE LITHOTRIPSY Left 09/05/2022   Procedure: LEFT EXTRACORPOREAL SHOCK WAVE LITHOTRIPSY (ESWL);  Surgeon: Selma Donnice SAUNDERS, MD;  Location: [facility];  Service: Urology;  Laterality: Left;   EXTRACORPOREAL SHOCK WAVE LITHOTRIPSY Right 09/22/2022   Procedure: RIGHT EXTRACORPOREAL SHOCK WAVE LITHOTRIPSY (ESWL);  Surgeon: Matilda Senior, MD;  Location: [facility];  Service: Urology;  Laterality: Right;   EXTRACORPOREAL SHOCK WAVE LITHOTRIPSY Left 11/24/2022   Procedure: LEFT EXTRACORPOREAL  SHOCK WAVE LITHOTRIPSY (ESWL);  Surgeon: Devere Lonni Righter, MD;  Location: [facility];  Service: Urology;  Laterality: Left;   FOOT NEUROMA SURGERY Bilateral left 09/2015; right 04/ 2017   morton' neuroma   FOOT NEUROMA SURGERY Right 2004   morton's neuroma and removal heel spur   HAMMER TOE SURGERY Left 2013   5th toe   INCISION AND DRAINAGE FOOT Left 1992   ball of foot; stepped on piece of hard wire & it went thru my shoe   IR ANGIOGRAM PULMONARY BILATERAL SELECTIVE  12/24/2021   IR ANGIOGRAM SELECTIVE EACH ADDITIONAL VESSEL  12/24/2021   IR ANGIOGRAM SELECTIVE EACH ADDITIONAL VESSEL  12/24/2021   IR INFUSION THROMBOL ARTERIAL INITIAL (MS)  12/24/2021   IR INFUSION THROMBOL ARTERIAL INITIAL (MS)  12/24/2021   IR THROMB F/U EVAL ART/VEN FINAL DAY (MS)  12/25/2021   IR US  GUIDE VASC ACCESS RIGHT  12/24/2021   KNEE ARTHROSCOPY Bilateral left x2 1997;  right 2004 & 2005   NASAL FRACTURE SURGERY  1997   PATELLA-FEMORAL ARTHROPLASTY Right 07-06-2003   dr deward [facility]   PLANTAR FASCIA RELEASE Bilateral 1987 & 1993   RHINOPLASTY  2000   SHOULDER ARTHROSCOPY Left 2012;  2013   SHOULDER ARTHROSCOPY WITH DISTAL CLAVICLE RESECTION Right 07-31-2005   dr beverley  [facility]   w/ Debridement labral and adhesions, acromioplasty, bursectomy, and CA ligament release   TRANSTHORACIC ECHOCARDIOGRAM  02/12/2016   ef 65-70%, grade 1 diastolic dysfunction/  trivial TR   UPPER GASTROINTESTINAL ENDOSCOPY     Social History   Occupational History   Occupation: retired    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[employer]    Comment: disabled now   Occupation: retired  Tobacco Use   Smoking status: Never   Smokeless tobacco: Never  Vaping Use   Vaping status: Never Used  Substance and Sexual Activity   Alcohol  use: No    Alcohol /week: 0.0 standard drinks of alcohol    Drug use: No   Sexual activity: Never

## 2024-03-26 LAB — BASIC METABOLIC PANEL WITH GFR
BUN: 16 mg/dL (ref 7–25)
CO2: 27 mmol/L (ref 20–32)
Calcium: 8.6 mg/dL (ref 8.6–10.3)
Chloride: 105 mmol/L (ref 98–110)
Creat: 0.79 mg/dL (ref 0.70–1.28)
Glucose, Bld: 90 mg/dL (ref 65–99)
Potassium: 4.2 mmol/L (ref 3.5–5.3)
Sodium: 140 mmol/L (ref 135–146)
eGFR: 94 mL/min/1.73m2 (ref >=60)

## 2024-03-26 LAB — PROTEIN ELECTROPHORESIS, SERUM
Albumin ELP: 4.1 g/dL (ref 3.8–4.8)
Alpha 1: 0.2 g/dL (ref 0.2–0.3)
Alpha 2: 0.6 g/dL (ref 0.5–0.9)
Beta 2: 0.3 g/dL (ref 0.2–0.5)
Beta Globulin: 0.4 g/dL (ref 0.4–0.6)
Gamma Globulin: 1.1 g/dL (ref 0.8–1.7)
Total Protein: 6.6 g/dL (ref 6.1–8.1)

## 2024-03-26 LAB — VITAMIN D 25 HYDROXY (VIT D DEFICIENCY, FRACTURES): Vit D, 25-Hydroxy: 29 ng/mL — ABNORMAL LOW (ref 30–100)

## 2024-03-26 LAB — EXTRA LAV TOP TUBE

## 2024-03-30 ENCOUNTER — Other Ambulatory Visit: Payer: Self-pay | Admitting: Radiology

## 2024-03-30 MED ORDER — TYMLOS 3120 MCG/1.56ML ~~LOC~~ SOPN: 80.0000 ug | PEN_INJECTOR | Freq: Every day | SUBCUTANEOUS | 12 refills | Status: DC

## 2024-03-30 NOTE — Progress Notes (Unsigned)
 Tymlos  Rx sent in to the [facility] in [place]. Will see what kind of coverage [facility] can offer.  Patient aware to watch for phone call from them.

## 2024-04-04 MED ORDER — TYMLOS 3120 MCG/1.56ML ~~LOC~~ SOPN: 80.0000 ug | PEN_INJECTOR | Freq: Every day | SUBCUTANEOUS | 12 refills | Status: DC

## 2024-04-11 ENCOUNTER — Telehealth: Payer: Self-pay | Admitting: Physician Assistant

## 2024-04-11 NOTE — Telephone Encounter (Signed)
 Patty called from [facility] needing a prior authorization for Pymlos. Call back number is [phone]. Key Code is [id number]

## 2024-04-20 ENCOUNTER — Other Ambulatory Visit: Payer: Self-pay | Admitting: Radiology

## 2024-04-20 ENCOUNTER — Ambulatory Visit

## 2024-04-20 MED ORDER — TYMLOS 3120 MCG/1.56ML ~~LOC~~ SOPN: 80.0000 ug | PEN_INJECTOR | Freq: Every day | SUBCUTANEOUS | 12 refills | Status: DC

## 2024-04-20 NOTE — Telephone Encounter (Signed)
 I called, David Armstrong at [facility] said to do the auth again.  I could not.  Rx sent in again to [facility].  I cannot get the form to pull up correctly on covermymeds. Will f/u once [facility] reaches back out from today's Rx submission.

## 2024-04-21 ENCOUNTER — Ambulatory Visit

## 2024-04-22 ENCOUNTER — Telehealth: Payer: Self-pay | Admitting: Physician Assistant

## 2024-04-22 NOTE — Telephone Encounter (Signed)
 [facility] called saying that they need a prior authorization for Tymlos . Me patients key is [id number]. The patient's insurance has the patient listed as a David Cooperman Jr. To enter the key. Call back number is [phone].

## 2024-04-27 ENCOUNTER — Ambulatory Visit

## 2024-04-27 VITALS — Ht 68.5 in | Wt 145.0 lb

## 2024-04-27 DIAGNOSIS — Z0000 Encounter for general adult medical examination without abnormal findings: Secondary | ICD-10-CM

## 2024-04-27 NOTE — Patient Instructions (Signed)
 David Armstrong,  Thank you for taking the time for your Medicare Wellness Visit. I appreciate your continued commitment to your health goals. Please review the care plan we discussed, and feel free to reach out if I can assist you further.  Please note that Annual Wellness Visits do not include a physical exam. Some assessments may be limited, especially if the visit was conducted virtually. If needed, we may recommend an in-person follow-up with your provider.  Ongoing Care Seeing your primary care provider every 3 to 6 months helps us  monitor your health and provide consistent, personalized care.   Referrals If a referral was made during today's visit and you haven't received any updates within two weeks, please contact the referred provider directly to check on the status.  Recommended Screenings:  Health Maintenance  Topic Date Due   Medicare Annual Wellness Visit  03/17/2024   COVID-19 Vaccine (8 - Moderna risk 2025-26 season) 07/27/2024   Colon Cancer Screening  02/23/2026   DTaP/Tdap/Td vaccine (3 - Td or Tdap) 06/04/2026   Pneumococcal Vaccine for age over 31  Completed   Flu Shot  Completed   Hepatitis C Screening  Completed   Zoster (Shingles) Vaccine  Completed   Meningitis B Vaccine  Aged Out       03/18/2023   11:26 AM  Advanced Directives  Does Patient Have a Medical Advance Directive? No  Would patient like information on creating a medical advance directive? No - Patient declined    Vision: Annual vision screenings are recommended for early detection of glaucoma, cataracts, and diabetic retinopathy. These exams can also reveal signs of chronic conditions such as diabetes and high blood pressure.  Dental: Annual dental screenings help detect early signs of oral cancer, gum disease, and other conditions linked to overall health, including heart disease and diabetes.  Please see the attached documents for additional preventive care recommendations.

## 2024-04-27 NOTE — Progress Notes (Deleted)
 "  Chief Complaint  Patient presents with   Medicare Wellness     Subjective:   David Armstrong is a 74 y.o. male who presents for a Medicare Annual Wellness Visit.  Fall Screening Falls in the past year?: 0 Number of falls in past year: 0 Was there an injury with Fall?: 0 Fall Risk Category Calculator: 0 Patient Fall Risk Level: Low Fall Risk  Fall Risk Patient at Risk for Falls Due to: No Fall Risks    Allergies (verified) Effexor  xr [venlafaxine  hcl er], Acyclovir  and related, Dexilant  [dexlansoprazole ], Ivp dye [iodinated contrast media], Soma [carisoprodol], Sulfa  antibiotics, and Zocor  [simvastatin ]   Current Medications (verified) Outpatient Encounter Medications as of 04/27/2024  Medication Sig   Abaloparatide  (TYMLOS ) 3120 MCG/1.56ML SOPN Inject 80 mcg into the skin daily.   desonide  (DESOWEN ) 0.05 % lotion Apply topically 2 (two) times daily.   fluticasone  (FLONASE ) 50 MCG/ACT nasal spray Place 2 sprays into both nostrils daily. USE twice a day for 1 week, then reduce to using daily.   hyoscyamine  (LEVSIN  SL) 0.125 MG SL tablet Take 2 tablets (0.25 mg total) by mouth every 6 (six) hours as needed.   linaclotide  (LINZESS ) 72 MCG capsule Take 1 capsule (72 mcg total) by mouth daily before breakfast.   LORazepam  (ATIVAN ) 1 MG tablet Take 0.5-1 tablets (0.5-1 mg total) by mouth every 8 (eight) hours as needed for anxiety.   omeprazole  (PRILOSEC) 40 MG capsule TAKE 1 CAPSULE(40 MG) BY MOUTH DAILY   pregabalin (LYRICA) 75 MG capsule Take 75 mg by mouth daily.   rivaroxaban  (XARELTO ) 20 MG TABS tablet Take 1 tablet (20 mg total) by mouth daily with supper. START this only after you have completed the starter pack.   tiZANidine  (ZANAFLEX ) 4 MG tablet    traMADol  (ULTRAM ) 50 MG tablet Take 1 tablet (50 mg total) by mouth 3 (three) times daily as needed for moderate pain (pain score 4-6) or severe pain (pain score 7-10).   triamcinolone  cream (KENALOG ) 0.1 % Apply topically 2  (two) times daily.   [DISCONTINUED] ABRYSVO 120 MCG/0.5ML injection Inject 0.5 mLs into the muscle once.   [DISCONTINUED] baclofen  (LIORESAL ) 10 MG tablet Take 0.5-1 tablets (5-10 mg total) by mouth 3 (three) times daily.   [DISCONTINUED] cephALEXin  (KEFLEX ) 500 MG capsule Take 500 mg by mouth 2 (two) times daily.   [DISCONTINUED] HYDROcodone -acetaminophen  (NORCO) 10-325 MG tablet Take 1 tablet by mouth every 6 (six) hours as needed.   No facility-administered encounter medications on file as of 04/27/2024.    History: Past Medical History:  Diagnosis Date   Anxiety    Arthritis    lower back; hips;' right knee    Bipolar disorder (HCC)    Chronic constipation    Depression    Diverticulosis of colon    Edema of both lower extremities    ANKLES   Fatty liver    GERD (gastroesophageal reflux disease)    Hiatal hernia    History of anal fissures    History of deep vein thrombosis (DVT) of lower extremity 02/11/2016   right lower extremity distal and proximal extensive acute   History of Helicobacter pylori infection 2012; 2009   History of kidney stones    History of pneumonia 01/2015   CAP   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    Hyperlipidemia    IBS (irritable bowel syndrome)    Left ureteral stone    Nocturia    Peripheral  neuropathy    per pt due to back DDD   Personal history of colonic polyps    HYPERPLASTIC POLYP   Pulmonary nodule, right    incidental finding CT chest angio 02-11-2016 stable   TMJ (temporomandibular joint syndrome)    Varicosities of leg    Past Surgical History:  Procedure Laterality Date   BELPHAROPTOSIS REPAIR Bilateral 01/2015   CATARACT EXTRACTION W/ INTRAOCULAR LENS  IMPLANT, BILATERAL  04/2016   COLONOSCOPY     COLONOSCOPY WITH ESOPHAGOGASTRODUODENOSCOPY (EGD)  last one 03-02-2013   CYSTOSCOPY/RETROGRADE/URETEROSCOPY/STONE EXTRACTION WITH BASKET  2010   CYSTOSCOPY/URETEROSCOPY/HOLMIUM LASER/STENT  PLACEMENT Left 03/16/2017   Procedure: CYSTOSCOPY/RETROGRADE/URETEROSCOPY/HOLMIUM LASER/STENT PLACEMENT, STONE BASKET EXTRACTION AND STENT PLACEMENT;  Surgeon: Ottelin, Mark, MD;  Location: [facility];  Service: Urology;  Laterality: Left;   EXCISIONAL HEMORRHOIDECTOMY  2015 approx.   EXTRACORPOREAL SHOCK WAVE LITHOTRIPSY  1987; 1992   EXTRACORPOREAL SHOCK WAVE LITHOTRIPSY Left 09/05/2022   Procedure: LEFT EXTRACORPOREAL SHOCK WAVE LITHOTRIPSY (ESWL);  Surgeon: Selma Donnice SAUNDERS, MD;  Location: [facility];  Service: Urology;  Laterality: Left;   EXTRACORPOREAL SHOCK WAVE LITHOTRIPSY Right 09/22/2022   Procedure: RIGHT EXTRACORPOREAL SHOCK WAVE LITHOTRIPSY (ESWL);  Surgeon: Matilda Senior, MD;  Location: [facility];  Service: Urology;  Laterality: Right;   EXTRACORPOREAL SHOCK WAVE LITHOTRIPSY Left 11/24/2022   Procedure: LEFT EXTRACORPOREAL SHOCK WAVE LITHOTRIPSY (ESWL);  Surgeon: Devere Lonni Righter, MD;  Location: [facility];  Service: Urology;  Laterality: Left;   FOOT NEUROMA SURGERY Bilateral left 09/2015; right 04/ 2017   morton' neuroma   FOOT NEUROMA SURGERY Right 2004   morton's neuroma and removal heel spur   HAMMER TOE SURGERY Left 2013   5th toe   INCISION AND DRAINAGE FOOT Left 1992   ball of foot; stepped on piece of hard wire & it went thru my shoe   IR ANGIOGRAM PULMONARY BILATERAL SELECTIVE  12/24/2021   IR ANGIOGRAM SELECTIVE EACH ADDITIONAL VESSEL  12/24/2021   IR ANGIOGRAM SELECTIVE EACH ADDITIONAL VESSEL  12/24/2021   IR INFUSION THROMBOL ARTERIAL INITIAL (MS)  12/24/2021   IR INFUSION THROMBOL ARTERIAL INITIAL (MS)  12/24/2021   IR THROMB F/U EVAL ART/VEN FINAL DAY (MS)  12/25/2021   IR US  GUIDE VASC ACCESS RIGHT  12/24/2021   KNEE ARTHROSCOPY Bilateral left x2 1997;  right 2004 & 2005   NASAL FRACTURE SURGERY  1997   PATELLA-FEMORAL ARTHROPLASTY Right 07-06-2003   dr deward [facility]   PLANTAR FASCIA  RELEASE Bilateral 1987 & 1993   RHINOPLASTY  2000   SHOULDER ARTHROSCOPY Left 2012;  2013   SHOULDER ARTHROSCOPY WITH DISTAL CLAVICLE RESECTION Right 07-31-2005   dr beverley  [facility]   w/ Debridement labral and adhesions, acromioplasty, bursectomy, and CA ligament release   TRANSTHORACIC ECHOCARDIOGRAM  02/12/2016   ef 65-70%, grade 1 diastolic dysfunction/  trivial TR   UPPER GASTROINTESTINAL ENDOSCOPY     Family History  Problem Relation Age of Onset   Heart disease Mother    Stroke Mother    Cirrhosis Father    Heart disease Father    Skin cancer Father    Heart disease Brother    Prostate cancer Maternal Grandfather    Parkinson's disease Paternal Grandfather    Colon cancer Neg Hx    Stomach cancer Neg Hx    Esophageal cancer Neg Hx    Rectal cancer Neg Hx    Social History   Occupational History   Occupation: retired  Employer: [employer]    Comment: disabled now   Occupation: retired  Tobacco Use   Smoking status: Never   Smokeless tobacco: Never  Vaping Use   Vaping status: Never Used  Substance and Sexual Activity   Alcohol  use: No    Alcohol /week: 0.0 standard drinks of alcohol    Drug use: No   Sexual activity: Never   Tobacco Counseling Counseling given: Not Answered  SDOH Screenings   Food Insecurity: No Food Insecurity (03/18/2023)  Housing: Low Risk (03/18/2023)  Transportation Needs: No Transportation Needs (03/18/2023)  Utilities: Not At Risk (03/18/2023)  Depression (PHQ2-9): High Risk (11/04/2023)  Financial Resource Strain: Low Risk (03/18/2023)  Physical Activity: Sufficiently Active (03/18/2023)  Social Connections: Socially Isolated (03/18/2023)  Stress: No Stress Concern Present (03/18/2023)  Tobacco Use: Low Risk (04/27/2024)  Health Literacy: Adequate Health Literacy (03/18/2023)   See flowsheets for full screening details  Depression Screen Depression Screening Exception Documentation Depression Screening Exception:: Patient  refusal  PHQ 2 & 9 Depression Scale- Over the past 2 weeks, how often have you been bothered by any of the following problems? Little interest or pleasure in doing things: 2 Feeling down, depressed, or hopeless (PHQ Adolescent also includes...irritable): 2 PHQ-2 Total Score: 4 Trouble falling or staying asleep, or sleeping too much: 2 Feeling tired or having little energy: 1 Poor appetite or overeating (PHQ Adolescent also includes...weight loss): 1 Feeling bad about yourself - or that you are a failure or have let yourself or your family down: 1 Trouble concentrating on things, such as reading the newspaper or watching television (PHQ Adolescent also includes...like school work): 1 Moving or speaking so slowly that other people could have noticed. Or the opposite - being so fidgety or restless that you have been moving around a lot more than usual: 1 Thoughts that you would be better off dead, or of hurting yourself in some way: 1 PHQ-9 Total Score: 12 If you checked off any problems, how difficult have these problems made it for you to do your work, take care of things at home, or get along with other people?: Somewhat difficult     Goals Addressed               This Visit's Progress     maintain health and activity (pt-stated)        Maintain health and activity              Objective:    Today's Vitals   04/27/24 1253  Weight: 145 lb (65.8 kg)  Height: 5' 8.5 (1.74 m)   Body mass index is 21.73 kg/m.  Hearing/Vision screen No results found. Immunizations and Health Maintenance Health Maintenance  Topic Date Due   COVID-19 Vaccine (8 - Moderna risk 2025-26 season) 07/27/2024   Medicare Annual Wellness (AWV)  04/27/2025   Colonoscopy  02/23/2026   DTaP/Tdap/Td (3 - Td or Tdap) 06/04/2026   Pneumococcal Vaccine: 50+ Years  Completed   Influenza Vaccine  Completed   Hepatitis C Screening  Completed   Zoster Vaccines- Shingrix  Completed   Meningococcal B  Vaccine  Aged Out        Assessment/Plan:  This is a routine wellness examination for David Armstrong.  Patient Care Team: Kennyth Worth HERO, MD as PCP - General (Family Medicine) Sheldon Standing, MD as Consulting Physician (General Surgery) Jakie Alm SAUNDERS, MD as Consulting Physician (Gastroenterology) Kerman Vina HERO, NP as Nurse Practitioner (Gastroenterology) Ceil Anes, MD (Inactive) as Consulting Physician (Urology)  Vincente Grip, MD as Consulting Physician (Psychiatry) Sherrilee Belvie CROME, MD as Consulting Physician (Urology)  I have personally reviewed and noted the following in the patients chart:   Medical and social history Use of alcohol , tobacco or illicit drugs  Current medications and supplements including opioid prescriptions. Functional ability and status Nutritional status Physical activity Advanced directives List of other physicians Hospitalizations, surgeries, and ER visits in previous 12 months Vitals Screenings to include cognitive, depression, and falls Referrals and appointments  No orders of the defined types were placed in this encounter.  In addition, I have reviewed and discussed with patient certain preventive protocols, quality metrics, and best practice recommendations. A written personalized care plan for preventive services as well as general preventive health recommendations were provided to patient.   Ellouise VEAR Haws, LPN   [id number]/85/[id number]   Return in about 1 year (around 05/01/2025).  After Visit Summary: (MyChart) Due to this being a telephonic visit, the after visit summary with patients personalized plan was offered to patient via MyChart   Nurse Notes: No voiced or noted concerns at this time  "

## 2024-04-28 NOTE — Addendum Note (Signed)
 Addended by: JAYLENE ELLOUISE DEL on: 04/28/2024 11:23 AM   Modules accepted: Orders

## 2024-04-28 NOTE — Progress Notes (Addendum)
 "  Chief Complaint  Patient presents with   Medicare Wellness     Subjective:   David Armstrong is a 74 y.o. male who presents for a Medicare Annual Wellness Visit.  Visit info / Clinical Intake: Medicare Wellness Visit Type:: Subsequent Annual Wellness Visit Persons participating in visit and providing information:: patient Medicare Wellness Visit Mode:: Telephone If telephone:: video declined Since this visit was completed virtually, some vitals may be partially provided or unavailable. Missing vitals are due to the limitations of the virtual format.: Unable to obtain vitals - no equipment If Telephone or Video please confirm:: I connected with patient using audio/video enable telemedicine. I verified patient identity with two identifiers, discussed telehealth limitations, and patient agreed to proceed. Patient Location:: home Provider Location:: office Interpreter Needed?: No Pre-visit prep was completed: yes AWV questionnaire completed by patient prior to visit?: no Living arrangements:: (!) lives alone Patient's Overall Health Status Rating: good Typical amount of pain: some Does pain affect daily life?: (!) yes Are you currently prescribed opioids?: (!) yes  Dietary Habits and Nutritional Risks How many meals a day?: 3 Eats fruit and vegetables daily?: yes Most meals are obtained by: eating out In the last 2 weeks, have you had any of the following?: none Diabetic:: no  Functional Status Activities of Daily Living (to include ambulation/medication): Independent Ambulation: Independent with device- listed below Home Assistive Devices/Equipment: Eyeglasses Medication Administration: Independent Home Management (perform basic housework or laundry): Independent Manage your own finances?: yes Primary transportation is: driving Concerns about vision?: no *vision screening is required for WTM* Concerns about hearing?: no  Fall Screening Falls in the past year?: 0 Number  of falls in past year: 0 Was there an injury with Fall?: 0 Fall Risk Category Calculator: 0 Patient Fall Risk Level: Low Fall Risk  Fall Risk Patient at Risk for Falls Due to: No Fall Risks Fall risk Follow up: Falls evaluation completed  Home and Transportation Safety: All rugs have non-skid backing?: (!) no All stairs or steps have railings?: yes Grab bars in the bathtub or shower?: yes Have non-skid surface in bathtub or shower?: (!) no Good home lighting?: yes Regular seat belt use?: yes Hospital stays in the last year:: no  Cognitive Assessment Difficulty concentrating, remembering, or making decisions? : no Will 6CIT or Mini Cog be Completed: yes What year is it?: 0 points What month is it?: 0 points Give patient an address phrase to remember (5 components): [address] [place]  About what time is it?: 0 points Count backwards from 20 to 1: 0 points Say the months of the year in reverse: 0 points Repeat the address phrase from earlier: 0 points 6 CIT Score: 0 points  Advance Directives (For Healthcare) Does Patient Have a Medical Advance Directive?: No Would patient like information on creating a medical advance directive?: No - Patient declined  Reviewed/Updated  Reviewed/Updated: Reviewed All (Medical, Surgical, Family, Medications, Allergies, Care Teams, Patient Goals)    Allergies (verified) Effexor  xr [venlafaxine  hcl er], Acyclovir  and related, Dexilant  [dexlansoprazole ], Ivp dye [iodinated contrast media], Soma [carisoprodol], Sulfa  antibiotics, and Zocor  [simvastatin ]   Current Medications (verified) Outpatient Encounter Medications as of 04/27/2024  Medication Sig   Abaloparatide  (TYMLOS ) 3120 MCG/1.56ML SOPN Inject 80 mcg into the skin daily.   desonide  (DESOWEN ) 0.05 % lotion Apply topically 2 (two) times daily.   fluticasone  (FLONASE ) 50 MCG/ACT nasal spray Place 2 sprays into both nostrils daily. USE twice a day for 1 week, then reduce  to using  daily.   hyoscyamine  (LEVSIN  SL) 0.125 MG SL tablet Take 2 tablets (0.25 mg total) by mouth every 6 (six) hours as needed.   linaclotide  (LINZESS ) 72 MCG capsule Take 1 capsule (72 mcg total) by mouth daily before breakfast.   LORazepam  (ATIVAN ) 1 MG tablet Take 0.5-1 tablets (0.5-1 mg total) by mouth every 8 (eight) hours as needed for anxiety.   omeprazole  (PRILOSEC) 40 MG capsule TAKE 1 CAPSULE(40 MG) BY MOUTH DAILY   pregabalin (LYRICA) 75 MG capsule Take 75 mg by mouth daily.   rivaroxaban  (XARELTO ) 20 MG TABS tablet Take 1 tablet (20 mg total) by mouth daily with supper. START this only after you have completed the starter pack.   tiZANidine  (ZANAFLEX ) 4 MG tablet    traMADol  (ULTRAM ) 50 MG tablet Take 1 tablet (50 mg total) by mouth 3 (three) times daily as needed for moderate pain (pain score 4-6) or severe pain (pain score 7-10).   triamcinolone  cream (KENALOG ) 0.1 % Apply topically 2 (two) times daily.   [DISCONTINUED] ABRYSVO 120 MCG/0.5ML injection Inject 0.5 mLs into the muscle once.   [DISCONTINUED] baclofen  (LIORESAL ) 10 MG tablet Take 0.5-1 tablets (5-10 mg total) by mouth 3 (three) times daily.   [DISCONTINUED] cephALEXin  (KEFLEX ) 500 MG capsule Take 500 mg by mouth 2 (two) times daily.   [DISCONTINUED] HYDROcodone -acetaminophen  (NORCO) 10-325 MG tablet Take 1 tablet by mouth every 6 (six) hours as needed.   No facility-administered encounter medications on file as of 04/27/2024.    History: Past Medical History:  Diagnosis Date   Anxiety    Arthritis    lower back; hips;' right knee    Bipolar disorder (HCC)    Chronic constipation    Depression    Diverticulosis of colon    Edema of both lower extremities    ANKLES   Fatty liver    GERD (gastroesophageal reflux disease)    Hiatal hernia    History of anal fissures    History of deep vein thrombosis (DVT) of lower extremity 02/11/2016   right lower extremity distal and proximal extensive acute   History of  Helicobacter pylori infection 2012; 2009   History of kidney stones    History of pneumonia 01/2015   CAP   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    Hyperlipidemia    IBS (irritable bowel syndrome)    Left ureteral stone    Nocturia    Peripheral neuropathy    per pt due to back DDD   Personal history of colonic polyps    HYPERPLASTIC POLYP   Pulmonary nodule, right    incidental finding CT chest angio 02-11-2016 stable   TMJ (temporomandibular joint syndrome)    Varicosities of leg    Past Surgical History:  Procedure Laterality Date   BELPHAROPTOSIS REPAIR Bilateral 01/2015   CATARACT EXTRACTION W/ INTRAOCULAR LENS  IMPLANT, BILATERAL  04/2016   COLONOSCOPY     COLONOSCOPY WITH ESOPHAGOGASTRODUODENOSCOPY (EGD)  last one 03-02-2013   CYSTOSCOPY/RETROGRADE/URETEROSCOPY/STONE EXTRACTION WITH BASKET  2010   CYSTOSCOPY/URETEROSCOPY/HOLMIUM LASER/STENT PLACEMENT Left 03/16/2017   Procedure: CYSTOSCOPY/RETROGRADE/URETEROSCOPY/HOLMIUM LASER/STENT PLACEMENT, STONE BASKET EXTRACTION AND STENT PLACEMENT;  Surgeon: Ottelin, Mark, MD;  Location: [facility];  Service: Urology;  Laterality: Left;   EXCISIONAL HEMORRHOIDECTOMY  2015 approx.   EXTRACORPOREAL SHOCK WAVE LITHOTRIPSY  1987; 1992   EXTRACORPOREAL SHOCK WAVE LITHOTRIPSY Left 09/05/2022   Procedure: LEFT EXTRACORPOREAL SHOCK WAVE LITHOTRIPSY (ESWL);  Surgeon: Selma Donnice SAUNDERS,  MD;  Location: [facility];  Service: Urology;  Laterality: Left;   EXTRACORPOREAL SHOCK WAVE LITHOTRIPSY Right 09/22/2022   Procedure: RIGHT EXTRACORPOREAL SHOCK WAVE LITHOTRIPSY (ESWL);  Surgeon: Matilda Senior, MD;  Location: [facility];  Service: Urology;  Laterality: Right;   EXTRACORPOREAL SHOCK WAVE LITHOTRIPSY Left 11/24/2022   Procedure: LEFT EXTRACORPOREAL SHOCK WAVE LITHOTRIPSY (ESWL);  Surgeon: Devere Lonni Righter, MD;  Location: [facility];  Service:  Urology;  Laterality: Left;   FOOT NEUROMA SURGERY Bilateral left 09/2015; right 04/ 2017   morton' neuroma   FOOT NEUROMA SURGERY Right 2004   morton's neuroma and removal heel spur   HAMMER TOE SURGERY Left 2013   5th toe   INCISION AND DRAINAGE FOOT Left 1992   ball of foot; stepped on piece of hard wire & it went thru my shoe   IR ANGIOGRAM PULMONARY BILATERAL SELECTIVE  12/24/2021   IR ANGIOGRAM SELECTIVE EACH ADDITIONAL VESSEL  12/24/2021   IR ANGIOGRAM SELECTIVE EACH ADDITIONAL VESSEL  12/24/2021   IR INFUSION THROMBOL ARTERIAL INITIAL (MS)  12/24/2021   IR INFUSION THROMBOL ARTERIAL INITIAL (MS)  12/24/2021   IR THROMB F/U EVAL ART/VEN FINAL DAY (MS)  12/25/2021   IR US  GUIDE VASC ACCESS RIGHT  12/24/2021   KNEE ARTHROSCOPY Bilateral left x2 1997;  right 2004 & 2005   NASAL FRACTURE SURGERY  1997   PATELLA-FEMORAL ARTHROPLASTY Right 07-06-2003   dr deward [facility]   PLANTAR FASCIA RELEASE Bilateral 1987 & 1993   RHINOPLASTY  2000   SHOULDER ARTHROSCOPY Left 2012;  2013   SHOULDER ARTHROSCOPY WITH DISTAL CLAVICLE RESECTION Right 07-31-2005   dr beverley  [facility]   w/ Debridement labral and adhesions, acromioplasty, bursectomy, and CA ligament release   TRANSTHORACIC ECHOCARDIOGRAM  02/12/2016   ef 65-70%, grade 1 diastolic dysfunction/  trivial TR   UPPER GASTROINTESTINAL ENDOSCOPY     Family History  Problem Relation Age of Onset   Heart disease Mother    Stroke Mother    Cirrhosis Father    Heart disease Father    Skin cancer Father    Heart disease Brother    Prostate cancer Maternal Grandfather    Parkinson's disease Paternal Grandfather    Colon cancer Neg Hx    Stomach cancer Neg Hx    Esophageal cancer Neg Hx    Rectal cancer Neg Hx    Social History   Occupational History   Occupation: retired    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[employer]    Comment: disabled now   Occupation: retired  Tobacco Use   Smoking status: Never   Smokeless tobacco: Never  Vaping Use   Vaping  status: Never Used  Substance and Sexual Activity   Alcohol  use: No    Alcohol /week: 0.0 standard drinks of alcohol    Drug use: No   Sexual activity: Never   Tobacco Counseling Counseling given: Not Answered  SDOH Screenings   Food Insecurity: No Food Insecurity (04/27/2024)  Housing: Low Risk (04/27/2024)  Transportation Needs: No Transportation Needs (04/27/2024)  Utilities: Not At Risk (04/27/2024)  Alcohol  Screen: Low Risk (04/27/2024)  Depression (PHQ2-9): Low Risk (04/27/2024)  Financial Resource Strain: Low Risk (03/18/2023)  Physical Activity: Sufficiently Active (04/27/2024)  Social Connections: Socially Isolated (04/27/2024)  Stress: Stress Concern Present (04/27/2024)  Tobacco Use: Low Risk (04/27/2024)  Health Literacy: Adequate Health Literacy (04/27/2024)   See flowsheets for full screening details  Depression Screen Depression Screening Exception Documentation Depression Screening Exception:: Patient refusal  PHQ 2 & 9 Depression Scale- Over the past 2 weeks, how often have you been bothered by any of the following problems? Little interest or pleasure in doing things: 0 Feeling down, depressed, or hopeless (PHQ Adolescent also includes...irritable): 1 PHQ-2 Total Score: 1 Trouble falling or staying asleep, or sleeping too much: 2 Feeling tired or having little energy: 1 Poor appetite or overeating (PHQ Adolescent also includes...weight loss): 1 Feeling bad about yourself - or that you are a failure or have let yourself or your family down: 1 Trouble concentrating on things, such as reading the newspaper or watching television (PHQ Adolescent also includes...like school work): 1 Moving or speaking so slowly that other people could have noticed. Or the opposite - being so fidgety or restless that you have been moving around a lot more than usual: 1 Thoughts that you would be better off dead, or of hurting yourself in some way: 1 PHQ-9 Total Score: 12 If you checked off  any problems, how difficult have these problems made it for you to do your work, take care of things at home, or get along with other people?: Somewhat difficult     Goals Addressed               This Visit's Progress     maintain health and activity (pt-stated)        Maintain health and activity              Objective:    Today's Vitals   04/27/24 1253  Weight: 145 lb (65.8 kg)  Height: 5' 8.5 (1.74 m)   Body mass index is 21.73 kg/m.  Hearing/Vision screen Hearing Screening - Comments:: Pt denies any hearing issues  Vision Screening - Comments:: Wears rx glasses - not up to date with routine eye exams with [facility] encouraged to follow up with  Immunizations and Health Maintenance Health Maintenance  Topic Date Due   COVID-19 Vaccine (8 - Moderna risk 2025-26 season) 07/27/2024   Medicare Annual Wellness (AWV)  04/27/2025   Colonoscopy  02/23/2026   DTaP/Tdap/Td (3 - Td or Tdap) 06/04/2026   Pneumococcal Vaccine: 50+ Years  Completed   Influenza Vaccine  Completed   Hepatitis C Screening  Completed   Zoster Vaccines- Shingrix  Completed   Meningococcal B Vaccine  Aged Out        Assessment/Plan:  This is a routine wellness examination for David Armstrong.  Patient Care Team: Kennyth Worth HERO, MD as PCP - General (Family Medicine) Sheldon Standing, MD as Consulting Physician (General Surgery) Jakie Alm SAUNDERS, MD as Consulting Physician (Gastroenterology) Kerman Vina HERO, NP as Nurse Practitioner (Gastroenterology) Ceil Anes, MD (Inactive) as Consulting Physician (Urology) Vincente Grip, MD as Consulting Physician (Psychiatry) Sherrilee Belvie CROME, MD as Consulting Physician (Urology)  I have personally reviewed and noted the following in the patients chart:   Medical and social history Use of alcohol , tobacco or illicit drugs  Current medications and supplements including opioid prescriptions. Functional ability and status Nutritional  status Physical activity Advanced directives List of other physicians Hospitalizations, surgeries, and ER visits in previous 12 months Vitals Screenings to include cognitive, depression, and falls Referrals and appointments  No orders of the defined types were placed in this encounter.  In addition, I have reviewed and discussed with patient certain preventive protocols, quality metrics, and best practice recommendations. A written personalized care plan for preventive services as well as general preventive health recommendations were provided to patient.  Ellouise VEAR Haws, LPN   [id number]/84/7973   Return in about 1 year (around 05/01/2025).  After Visit Summary: (MyChart) Due to this being a telephonic visit, the after visit summary with patients personalized plan was offered to patient via MyChart   Nurse Notes: No voiced or noted concerns at this time  "

## 2024-05-04 ENCOUNTER — Other Ambulatory Visit: Payer: Self-pay | Admitting: Physician Assistant

## 2024-05-04 ENCOUNTER — Telehealth: Payer: Self-pay

## 2024-05-04 MED ORDER — DENOSUMAB 60 MG/ML ~~LOC~~ SOSY: 60.0000 mg | PREFILLED_SYRINGE | Freq: Once | SUBCUTANEOUS | Status: AC

## 2024-05-04 NOTE — Telephone Encounter (Signed)
 Patient called stating he is not able to take Tymlos  due to his kidneys. Would like a CB to discuss.  Cb# [phone].  Please advise.  Thank you.

## 2024-05-06 ENCOUNTER — Other Ambulatory Visit: Payer: Self-pay | Admitting: Family Medicine

## 2024-05-10 ENCOUNTER — Ambulatory Visit: Admitting: Family Medicine

## 2024-05-19 ENCOUNTER — Other Ambulatory Visit: Payer: Self-pay

## 2024-09-26 ENCOUNTER — Ambulatory Visit: Admitting: Family Medicine

## 2025-05-01 ENCOUNTER — Ambulatory Visit
